# Patient Record
Sex: Female | Born: 1980 | Race: Black or African American | Hispanic: No | Smoking: Never smoker
Health system: Southern US, Community
[De-identification: ages and names within clinical notes are randomized; demographics above are authoritative.]

## PROBLEM LIST (undated history)

## (undated) ENCOUNTER — Ambulatory Visit: Disposition: A | Discharge status: Home / Self Care | Payer: Managed Care, Other (non HMO)

## (undated) ENCOUNTER — Inpatient Hospital Stay (HOSPITAL_COMMUNITY): Payer: Self-pay

## (undated) DIAGNOSIS — R011 Cardiac murmur, unspecified: Secondary | ICD-10-CM

## (undated) DIAGNOSIS — N879 Dysplasia of cervix uteri, unspecified: Secondary | ICD-10-CM

## (undated) DIAGNOSIS — O139 Gestational [pregnancy-induced] hypertension without significant proteinuria, unspecified trimester: Secondary | ICD-10-CM

## (undated) DIAGNOSIS — O10911 Unspecified pre-existing hypertension complicating pregnancy, first trimester: Secondary | ICD-10-CM

## (undated) DIAGNOSIS — B999 Unspecified infectious disease: Secondary | ICD-10-CM

## (undated) DIAGNOSIS — I10 Essential (primary) hypertension: Secondary | ICD-10-CM

## (undated) DIAGNOSIS — N390 Urinary tract infection, site not specified: Secondary | ICD-10-CM

## (undated) DIAGNOSIS — R87629 Unspecified abnormal cytological findings in specimens from vagina: Secondary | ICD-10-CM

## (undated) DIAGNOSIS — O1410 Severe pre-eclampsia, unspecified trimester: Secondary | ICD-10-CM

## (undated) DIAGNOSIS — R51 Headache: Secondary | ICD-10-CM

## (undated) DIAGNOSIS — K8020 Calculus of gallbladder without cholecystitis without obstruction: Secondary | ICD-10-CM

## (undated) HISTORY — PX: LEEP: SHX91

## (undated) HISTORY — DX: Dysplasia of cervix uteri, unspecified: N87.9

## (undated) HISTORY — DX: Urinary tract infection, site not specified: N39.0

## (undated) HISTORY — DX: Severe pre-eclampsia, unspecified trimester: O14.10

## (undated) HISTORY — PX: DILATION AND CURETTAGE OF UTERUS: SHX78

## (undated) HISTORY — DX: Unspecified pre-existing hypertension complicating pregnancy, first trimester: O10.911

---

## 2010-07-18 NOTE — L&D Delivery Note (Signed)
Delivery Note  Pt was complete and pushed well, infant with spontaneous respirations and placed on mom's abdomen  At 3:03 AM a viable female was delivered via Vaginal, Spontaneous Delivery (Presentation: Right Occiput Anterior).  APGAR: 9, 9 , ; weight 5#5oz Placenta status: , Spontaneous.  Cord: 3 vessels with the following complications: Marland Kitchen  Marginal cord insertion, slightly velamentous insertion, sent to pathology  Routine cord blood collected.   Anesthesia: None  Episiotomy: None Lacerations: None Suture Repair: none Est. Blood Loss (mL): 200cc  Mom to postpartum.  Baby to nursery-stable  Mom plans to breast feed.  Jaydalee Bardwell M 04/06/2011, 3:16 AM

## 2010-09-15 LAB — ABO/RH: RH Type: POSITIVE

## 2010-09-15 LAB — HIV ANTIBODY (ROUTINE TESTING W REFLEX): HIV: NONREACTIVE

## 2010-09-15 LAB — RUBELLA ANTIBODY, IGM: Rubella: IMMUNE

## 2010-09-15 LAB — GC/CHLAMYDIA PROBE AMP, GENITAL: Gonorrhea: NEGATIVE

## 2010-11-01 ENCOUNTER — Inpatient Hospital Stay (HOSPITAL_COMMUNITY)
Admission: AD | Admit: 2010-11-01 | Discharge: 2010-11-01 | Disposition: A | Discharge status: Home / Self Care | Payer: Medicaid Other | Source: Ambulatory Visit | Attending: Obstetrics & Gynecology | Admitting: Obstetrics & Gynecology

## 2010-11-01 DIAGNOSIS — G44209 Tension-type headache, unspecified, not intractable: Secondary | ICD-10-CM | POA: Insufficient documentation

## 2010-11-01 DIAGNOSIS — O9989 Other specified diseases and conditions complicating pregnancy, childbirth and the puerperium: Secondary | ICD-10-CM

## 2010-11-01 DIAGNOSIS — O99891 Other specified diseases and conditions complicating pregnancy: Secondary | ICD-10-CM | POA: Insufficient documentation

## 2010-11-01 LAB — URINALYSIS, ROUTINE W REFLEX MICROSCOPIC
Bilirubin Urine: NEGATIVE
Glucose, UA: NEGATIVE mg/dL
Hgb urine dipstick: NEGATIVE
Ketones, ur: NEGATIVE mg/dL
Protein, ur: NEGATIVE mg/dL

## 2010-11-16 ENCOUNTER — Inpatient Hospital Stay (HOSPITAL_COMMUNITY): Payer: Medicaid Other

## 2010-11-16 ENCOUNTER — Inpatient Hospital Stay (HOSPITAL_COMMUNITY)
Admission: AD | Admit: 2010-11-16 | Discharge: 2010-11-17 | Disposition: A | Discharge status: Home / Self Care | Payer: Medicaid Other | Source: Ambulatory Visit | Attending: Obstetrics and Gynecology | Admitting: Obstetrics and Gynecology

## 2010-11-16 DIAGNOSIS — A499 Bacterial infection, unspecified: Secondary | ICD-10-CM | POA: Insufficient documentation

## 2010-11-16 DIAGNOSIS — B9689 Other specified bacterial agents as the cause of diseases classified elsewhere: Secondary | ICD-10-CM | POA: Insufficient documentation

## 2010-11-16 DIAGNOSIS — N760 Acute vaginitis: Secondary | ICD-10-CM | POA: Insufficient documentation

## 2010-11-16 DIAGNOSIS — O210 Mild hyperemesis gravidarum: Secondary | ICD-10-CM | POA: Insufficient documentation

## 2010-11-16 DIAGNOSIS — R51 Headache: Secondary | ICD-10-CM | POA: Insufficient documentation

## 2010-11-16 DIAGNOSIS — O2390 Unspecified genitourinary tract infection in pregnancy, unspecified trimester: Secondary | ICD-10-CM | POA: Insufficient documentation

## 2010-11-16 LAB — URINALYSIS, ROUTINE W REFLEX MICROSCOPIC
Bilirubin Urine: NEGATIVE
Glucose, UA: NEGATIVE mg/dL
Ketones, ur: NEGATIVE mg/dL
pH: 6 (ref 5.0–8.0)

## 2010-11-16 LAB — WET PREP, GENITAL

## 2010-11-16 LAB — CBC
Hemoglobin: 10.5 g/dL — ABNORMAL LOW (ref 12.0–15.0)
MCHC: 33.2 g/dL (ref 30.0–36.0)

## 2010-11-17 LAB — GC/CHLAMYDIA PROBE AMP, GENITAL
Chlamydia, DNA Probe: NEGATIVE
GC Probe Amp, Genital: NEGATIVE

## 2010-11-29 ENCOUNTER — Inpatient Hospital Stay (HOSPITAL_COMMUNITY)
Admission: AD | Admit: 2010-11-29 | Discharge: 2010-11-29 | Disposition: A | Discharge status: Home / Self Care | Payer: Medicaid Other | Source: Ambulatory Visit | Attending: Obstetrics and Gynecology | Admitting: Obstetrics and Gynecology

## 2010-11-29 DIAGNOSIS — O99891 Other specified diseases and conditions complicating pregnancy: Secondary | ICD-10-CM | POA: Insufficient documentation

## 2010-11-29 LAB — URINALYSIS, ROUTINE W REFLEX MICROSCOPIC
Bilirubin Urine: NEGATIVE
Ketones, ur: NEGATIVE mg/dL
Nitrite: NEGATIVE
Protein, ur: NEGATIVE mg/dL
Specific Gravity, Urine: 1.02 (ref 1.005–1.030)
Urobilinogen, UA: 1 mg/dL (ref 0.0–1.0)

## 2010-12-09 ENCOUNTER — Inpatient Hospital Stay (HOSPITAL_COMMUNITY)
Admission: AD | Admit: 2010-12-09 | Discharge: 2010-12-09 | Disposition: A | Discharge status: Home / Self Care | Payer: Medicaid Other | Source: Ambulatory Visit | Attending: Obstetrics and Gynecology | Admitting: Obstetrics and Gynecology

## 2010-12-09 DIAGNOSIS — O99891 Other specified diseases and conditions complicating pregnancy: Secondary | ICD-10-CM | POA: Insufficient documentation

## 2010-12-09 DIAGNOSIS — R109 Unspecified abdominal pain: Secondary | ICD-10-CM | POA: Insufficient documentation

## 2010-12-09 LAB — COMPREHENSIVE METABOLIC PANEL
AST: 22 U/L (ref 0–37)
Albumin: 3.3 g/dL — ABNORMAL LOW (ref 3.5–5.2)
Calcium: 9.7 mg/dL (ref 8.4–10.5)
Creatinine, Ser: 0.63 mg/dL (ref 0.4–1.2)
GFR calc Af Amer: >60 mL/min (ref >60)
Total Protein: 7.5 g/dL (ref 6.0–8.3)

## 2010-12-09 LAB — CBC
MCH: 27.8 pg (ref 26.0–34.0)
MCHC: 33.5 g/dL (ref 30.0–36.0)
Platelets: 238 10*3/uL (ref 150–400)
RBC: 4.43 MIL/uL (ref 3.87–5.11)
RDW: 13.5 % (ref 11.5–15.5)

## 2010-12-09 LAB — URINALYSIS, ROUTINE W REFLEX MICROSCOPIC
Bilirubin Urine: NEGATIVE
Glucose, UA: NEGATIVE mg/dL
Hgb urine dipstick: NEGATIVE
Ketones, ur: 40 mg/dL — AB
pH: 6 (ref 5.0–8.0)

## 2010-12-09 LAB — WET PREP, GENITAL: Yeast Wet Prep HPF POC: NONE SEEN

## 2011-03-26 ENCOUNTER — Inpatient Hospital Stay (HOSPITAL_COMMUNITY)
Admission: AD | Admit: 2011-03-26 | Discharge: 2011-03-27 | Disposition: A | Discharge status: Home / Self Care | Payer: Medicaid Other | Source: Ambulatory Visit | Attending: Obstetrics and Gynecology | Admitting: Obstetrics and Gynecology

## 2011-03-26 ENCOUNTER — Encounter (HOSPITAL_COMMUNITY): Payer: Self-pay

## 2011-03-26 DIAGNOSIS — O9989 Other specified diseases and conditions complicating pregnancy, childbirth and the puerperium: Secondary | ICD-10-CM | POA: Insufficient documentation

## 2011-03-26 DIAGNOSIS — R109 Unspecified abdominal pain: Secondary | ICD-10-CM | POA: Insufficient documentation

## 2011-03-26 DIAGNOSIS — Z331 Pregnant state, incidental: Secondary | ICD-10-CM

## 2011-03-26 LAB — URINALYSIS, ROUTINE W REFLEX MICROSCOPIC
Glucose, UA: NEGATIVE mg/dL
Protein, ur: NEGATIVE mg/dL
Specific Gravity, Urine: <1.005 — ABNORMAL LOW (ref 1.005–1.030)

## 2011-03-26 LAB — URINE MICROSCOPIC-ADD ON

## 2011-03-26 LAB — WET PREP, GENITAL
Clue Cells Wet Prep HPF POC: NONE SEEN
Yeast Wet Prep HPF POC: NONE SEEN

## 2011-03-26 MED ORDER — ONDANSETRON 8 MG PO TBDP
8.0000 mg | ORAL_TABLET | Freq: Once | ORAL | Status: AC
  Administered 2011-03-26: 8 mg via ORAL
  Filled 2011-03-26: qty 1

## 2011-03-26 NOTE — ED Provider Notes (Signed)
History     Chief Complaint  Patient presents with  . Abdominal Cramping   HPI Pt presents with c/o sudden onset of sharp abdominal pain in the middle of her abdomen this morning when lying in bed.  She reports the pain continues to be present but has decreased.  She denies rupture of membranes or vag bleeding.  She reports her fetus is moving normally. OB History    Grav Para Term Preterm Abortions TAB SAB Ect Mult Living   7 1 1  0 5 1 4  0 0 1      Past Medical History  Diagnosis Date  . No pertinent past medical history     Past Surgical History  Procedure Date  . No past surgeries     No family history on file.  History  Substance Use Topics  . Smoking status: Never Smoker   . Smokeless tobacco: Not on file  . Alcohol Use: No    Allergies: No Known Allergies  Prescriptions prior to admission  Medication Sig Dispense Refill  . acetaminophen (TYLENOL) 500 MG tablet Take 1,000 mg by mouth 3 (three) times daily as needed. For headache        . prenatal vitamin w/FE, FA (PRENATAL 1 + 1) 27-1 MG TABS Take 1 tablet by mouth daily.          Review of Systems  Constitutional: Negative.   HENT: Negative.   Eyes: Negative.   Respiratory: Negative.   Cardiovascular: Negative.   Gastrointestinal: Positive for abdominal pain.  Genitourinary: Negative.   Musculoskeletal: Negative.   Skin: Negative.   Neurological: Negative.   Endo/Heme/Allergies: Negative.    Physical Exam   Blood pressure 136/93, pulse 84, temperature 97.6 F (36.4 C), temperature source Oral, height 5\' 5"  (1.651 m), weight 82.736 kg (182 lb 6.4 oz). Filed Vitals:   03/26/11 1429 03/26/11 1745  BP: 136/93 122/72  Pulse: 84 78  Temp: 97.6 F (36.4 C)   TempSrc: Oral   Resp:  18  Height: 5\' 5"  (1.651 m)   Weight: 82.736 kg (182 lb 6.4 oz)    Physical Exam  Constitutional: She is oriented to person, place, and time. She appears well-developed and well-nourished.  HENT:  Head: Normocephalic  and atraumatic.  Right Ear: External ear normal.  Left Ear: External ear normal.  Nose: Nose normal.  Eyes: Conjunctivae and EOM are normal. Pupils are equal, round, and reactive to light.  Neck: Normal range of motion. Neck supple. No thyromegaly present.  Cardiovascular: Normal rate, regular rhythm, normal heart sounds and intact distal pulses.   Respiratory: Effort normal and breath sounds normal.  GI: Soft. Bowel sounds are normal. There is no tenderness.  Genitourinary: Vagina normal and uterus normal.  Musculoskeletal: Normal range of motion.  Neurological: She is alert and oriented to person, place, and time. She has normal reflexes.  Skin: Skin is warm and dry.  Psychiatric: She has a normal mood and affect. Her behavior is normal. Judgment and thought content normal.  FHR 140 baseline with moderate variability present.  Accels present.  No decels present.  Cat I FHR tracing UCs since admission every 10-15 minutes with 60 sec duration and mild to palpation SVE 3/50/-2 on admission and at the time of discharge. Results for orders placed during the hospital encounter of 03/26/11 (from the past 24 hour(s))  URINALYSIS, ROUTINE W REFLEX MICROSCOPIC     Status: Abnormal   Collection Time   03/26/11  2:35 PM  Component Value Range   Color, Urine YELLOW  YELLOW    Appearance HAZY (*) CLEAR    Specific Gravity, Urine <1.005 (*) 1.005 - 1.030    pH 6.5  5.0 - 8.0    Glucose, UA NEGATIVE  NEGATIVE (mg/dL)   Hgb urine dipstick TRACE (*) NEGATIVE    Bilirubin Urine NEGATIVE  NEGATIVE    Ketones, ur NEGATIVE  NEGATIVE (mg/dL)   Protein, ur NEGATIVE  NEGATIVE (mg/dL)   Urobilinogen, UA 0.2  0.0 - 1.0 (mg/dL)   Nitrite NEGATIVE  NEGATIVE    Leukocytes, UA MODERATE (*) NEGATIVE   URINE MICROSCOPIC-ADD ON     Status: Abnormal   Collection Time   03/26/11  2:35 PM      Component Value Range   Squamous Epithelial / LPF MANY (*) RARE    WBC, UA TOO NUMEROUS TO COUNT  <3 (WBC/hpf)   RBC /  HPF 0-2  <3 (RBC/hpf)   Bacteria, UA MANY (*) RARE   WET PREP, GENITAL     Status: Abnormal   Collection Time   03/26/11  3:45 PM      Component Value Range   Yeast, Wet Prep NONE SEEN  NONE SEEN    Trich, Wet Prep NONE SEEN  NONE SEEN    Clue Cells, Wet Prep NONE SEEN  NONE SEEN    WBC, Wet Prep HPF POC MODERATE (*) NONE SEEN     MAU Course  Procedures GC/Chl GBS culture UA Urine culture (results pending)   Assessment and Plan  IUP at 35.2 weeks Abdominal pain  Consult obtained with Dr. Su Hilt.   D/W pt pain more consistent with musculoskeletal origin.  Comfort measures d/w pt. Discharge to home. Reviewed signs/symptoms of preterm labor. Pt to RTO at [facility] on Mon, 9/10 as previously scheduled.   Brittany [name] O. 03/26/2011, 5:37 PM

## 2011-03-26 NOTE — Progress Notes (Signed)
Onset of cramping x 2 days worse today, denies vaginal bleeding, no discharge.

## 2011-03-28 LAB — CULTURE, BETA STREP (GROUP B ONLY)

## 2011-03-29 LAB — URINE CULTURE

## 2011-04-03 ENCOUNTER — Inpatient Hospital Stay (HOSPITAL_COMMUNITY)
Admission: AD | Admit: 2011-04-03 | Discharge: 2011-04-03 | Disposition: A | Discharge status: Home / Self Care | Payer: Medicaid Other | Source: Ambulatory Visit | Attending: Obstetrics and Gynecology | Admitting: Obstetrics and Gynecology

## 2011-04-03 ENCOUNTER — Encounter (HOSPITAL_COMMUNITY): Payer: Self-pay | Admitting: *Deleted

## 2011-04-03 DIAGNOSIS — M549 Dorsalgia, unspecified: Secondary | ICD-10-CM | POA: Insufficient documentation

## 2011-04-03 DIAGNOSIS — N949 Unspecified condition associated with female genital organs and menstrual cycle: Secondary | ICD-10-CM | POA: Insufficient documentation

## 2011-04-03 DIAGNOSIS — O99891 Other specified diseases and conditions complicating pregnancy: Secondary | ICD-10-CM | POA: Insufficient documentation

## 2011-04-03 LAB — COMPREHENSIVE METABOLIC PANEL
ALT: 15 U/L (ref 0–35)
AST: 22 U/L (ref 0–37)
Albumin: 2.6 g/dL — ABNORMAL LOW (ref 3.5–5.2)
CO2: 22 mEq/L (ref 19–32)
Calcium: 8.9 mg/dL (ref 8.4–10.5)
Chloride: 102 mEq/L (ref 96–112)
Creatinine, Ser: 0.5 mg/dL (ref 0.50–1.10)
Sodium: 135 mEq/L (ref 135–145)
Total Bilirubin: 0.2 mg/dL — ABNORMAL LOW (ref 0.3–1.2)

## 2011-04-03 LAB — URINALYSIS, ROUTINE W REFLEX MICROSCOPIC
Nitrite: NEGATIVE
Specific Gravity, Urine: 1.01 (ref 1.005–1.030)
Urobilinogen, UA: 0.2 mg/dL (ref 0.0–1.0)

## 2011-04-03 LAB — CBC
Hemoglobin: 11.2 g/dL — ABNORMAL LOW (ref 12.0–15.0)
MCH: 28.6 pg (ref 26.0–34.0)
Platelets: 197 10*3/uL (ref 150–400)
RBC: 3.92 MIL/uL (ref 3.87–5.11)
WBC: 12.1 10*3/uL — ABNORMAL HIGH (ref 4.0–10.5)

## 2011-04-03 LAB — URINE MICROSCOPIC-ADD ON

## 2011-04-03 LAB — URIC ACID: Uric Acid, Serum: 3.8 mg/dL (ref 2.4–7.0)

## 2011-04-03 LAB — BASIC METABOLIC PANEL
CO2: 23 mEq/L (ref 19–32)
Calcium: 8.8 mg/dL (ref 8.4–10.5)
Chloride: 103 mEq/L (ref 96–112)
Glucose, Bld: 72 mg/dL (ref 70–99)
Potassium: 3.5 mEq/L (ref 3.5–5.1)
Sodium: 134 mEq/L — ABNORMAL LOW (ref 135–145)

## 2011-04-03 LAB — LACTATE DEHYDROGENASE: LDH: 158 U/L (ref 94–250)

## 2011-04-03 MED ORDER — CYCLOBENZAPRINE HCL 10 MG PO TABS
10.0000 mg | ORAL_TABLET | Freq: Three times a day (TID) | ORAL | Status: DC | PRN
  Administered 2011-04-03: 10 mg via ORAL
  Filled 2011-04-03: qty 1

## 2011-04-03 MED ORDER — TRAMADOL HCL 50 MG PO TABS: 50.0000 mg | ORAL_TABLET | Freq: Four times a day (QID) | ORAL | Status: DC | PRN

## 2011-04-03 MED ORDER — TRAMADOL HCL 50 MG PO TABS
100.0000 mg | ORAL_TABLET | Freq: Once | ORAL | Status: AC
  Administered 2011-04-03: 100 mg via ORAL
  Filled 2011-04-03: qty 2

## 2011-04-03 MED ORDER — CYCLOBENZAPRINE HCL 10 MG PO TABS: 10.0000 mg | ORAL_TABLET | Freq: Three times a day (TID) | ORAL | Status: DC | PRN

## 2011-04-03 NOTE — Plan of Care (Signed)
Called patient to inform PIH labs were normal and to discontinue ultram.

## 2011-04-03 NOTE — Progress Notes (Signed)
Lower back pain with pressure and cramping since last night, light headed today.

## 2011-04-03 NOTE — Progress Notes (Signed)
Pt c/o upper right sided back pain since yesterday.  Pt has taken tylenol and it has not worked yet.   States she feels pressure when she voids and when she walks for the past two weeks but has worsened this morning.

## 2011-04-03 NOTE — ED Provider Notes (Signed)
History    30 yo [id number] at 28 w 3 days presented unannounced c/o severe mid back pain, lower pelvic pressure--has been on-going, but worse today.  Denies nausea, vomiting, fever, vaginal bleeding, or discharge.  Cervix was 3 cm in office on Monday.  Reports this pain is different from contractions.  Has had musculoskeletal issues throughout pregnancy, with previous use of Vicodin and Flexeril for back and pelvic pain--"they didn't help, just made me sleepy". Also c/o dizziness, no syncope, or headache.  Pregnancy remarkable for: 4 SABs, 1 TAB Hx LEEP 1st trimester bleeding + GBS on urine culture 2/12 Frequent HAs  Chief Complaint  Patient presents with  . Back Pain  . Dizziness  . Abdominal Cramping     OB History    Grav Para Term Preterm Abortions TAB SAB Ect Mult Living   7 1 1  0 5 1 4  0 0 1      Past Medical History  Diagnosis Date  . No pertinent past medical history     Past Surgical History  Procedure Date  . No past surgeries   . Multiple abortions   Hx LEEP  Family History  Problem Relation Age of Onset  . Asthma Mother   . Hypertension Mother   . Asthma Maternal Grandmother     History  Substance Use Topics  . Smoking status: Never Smoker   . Smokeless tobacco: Not on file  . Alcohol Use: No    Allergies: No Known Allergies  Prescriptions prior to admission  Medication Sig Dispense Refill  . acetaminophen (TYLENOL) 500 MG tablet Take 1,000 mg by mouth 3 (three) times daily as needed. For headache        . prenatal vitamin w/FE, FA (PRENATAL 1 + 1) 27-1 MG TABS Take 1 tablet by mouth daily.           Physical Exam   Blood pressure 140/87, pulse 75, temperature 98.5 F (36.9 C), temperature source Oral, resp. rate 16, height 5\' 5"  (1.651 m), weight 85.276 kg (188 lb).  Chest clear Heart RRR Abd--Gravid, NT No clear CVAT, but mid back area tender bilaterally Pelvic--Cx posterior, 3 cm, 70%, vtx -2 FHR reactive, no decels UCs sporadic,  mild--patient does not react to those  UA--Negative Sent to culture ED Course  IUP at 36 3/7 weeks Back pain, pelvic pressure--likely due to 3rd trimester miseries No evidence labor  Plan: Ultram and Flexeril now Will observe response.  Nigel Bridgeman, CNM 04/03/11  1720

## 2011-04-03 NOTE — ED Provider Notes (Signed)
Filed Vitals:   04/03/11 1538 04/03/11 1557 04/03/11 1843  BP: 132/87 140/87 140/90  Pulse: 83 75 67  Temp: 98.5 F (36.9 C)    TempSrc: Oral    Resp: 18 16 16   Height: 5\' 5"  (1.651 m)    Weight: 85.276 kg (188 lb)     Pt denies HA/N/V epigastric pain Neg edema, DTR's nl, neg clonus  PIH labs drawn  Pt desires to go home, will call if abnl results  Pt sent home with 24hr urine collection to RTO on Wednesday during her appt.  Pt feels better after flexeril and ultram given.   DC'd home with RX for ultram and flexeril  FKC, labor sx's, and PIH precautions given  D/W Dr. Pennie Rushing and recommends offering percocet for pain instead of ultram  Will call pt to DC ultram  PIH labs normal  Results for orders placed during the hospital encounter of 04/03/11 (from the past 24 hour(s))  URINALYSIS, ROUTINE W REFLEX MICROSCOPIC     Status: Abnormal   Collection Time   04/03/11  3:40 PM      Component Value Range   Color, Urine YELLOW  YELLOW    Appearance CLEAR  CLEAR    Specific Gravity, Urine 1.010  1.005 - 1.030    pH 6.0  5.0 - 8.0    Glucose, UA NEGATIVE  NEGATIVE (mg/dL)   Hgb urine dipstick TRACE (*) NEGATIVE    Bilirubin Urine NEGATIVE  NEGATIVE    Ketones, ur NEGATIVE  NEGATIVE (mg/dL)   Protein, ur NEGATIVE  NEGATIVE (mg/dL)   Urobilinogen, UA 0.2  0.0 - 1.0 (mg/dL)   Nitrite NEGATIVE  NEGATIVE    Leukocytes, UA NEGATIVE  NEGATIVE   URINE MICROSCOPIC-ADD ON     Status: Normal   Collection Time   04/03/11  3:40 PM      Component Value Range   Squamous Epithelial / LPF RARE  RARE    RBC / HPF 0-2  <3 (RBC/hpf)  CBC     Status: Abnormal   Collection Time   04/03/11  6:50 PM      Component Value Range   WBC 12.1 (*) 4.0 - 10.5 (K/uL)   RBC 3.92  3.87 - 5.11 (MIL/uL)   Hemoglobin 11.2 (*) 12.0 - 15.0 (g/dL)   HCT 21.3 (*) 08.6 - 46.0 (%)   MCV 85.2  78.0 - 100.0 (fL)   MCH 28.6  26.0 - 34.0 (pg)   MCHC 33.5  30.0 - 36.0 (g/dL)   RDW 57.8  46.9 - 62.9 (%)   Platelets 197  150 - 400 (K/uL)  BASIC METABOLIC PANEL     Status: Abnormal   Collection Time   04/03/11  6:50 PM      Component Value Range   Sodium 134 (*) 135 - 145 (mEq/L)   Potassium 3.5  3.5 - 5.1 (mEq/L)   Chloride 103  96 - 112 (mEq/L)   CO2 23  19 - 32 (mEq/L)   Glucose, Bld 72  70 - 99 (mg/dL)   BUN 7  6 - 23 (mg/dL)   Creatinine, Ser 5.28 (*) 0.50 - 1.10 (mg/dL)   Calcium 8.8  8.4 - 41.3 (mg/dL)   GFR calc non Af Amer >60  >60 (mL/min)   GFR calc Af Amer >60  >60 (mL/min)  LACTATE DEHYDROGENASE     Status: Normal   Collection Time   04/03/11  6:50 PM      Component Value Range  LD 158  94 - 250 (U/L)  URIC ACID     Status: Normal   Collection Time   04/03/11  6:50 PM      Component Value Range   Uric Acid, Serum 3.8  2.4 - 7.0 (mg/dL)  COMPREHENSIVE METABOLIC PANEL     Status: Abnormal   Collection Time   04/03/11  7:00 PM      Component Value Range   Sodium 135  135 - 145 (mEq/L)   Potassium 3.6  3.5 - 5.1 (mEq/L)   Chloride 102  96 - 112 (mEq/L)   CO2 22  19 - 32 (mEq/L)   Glucose, Bld 73  70 - 99 (mg/dL)   BUN 7  6 - 23 (mg/dL)   Creatinine, Ser 1.61  0.50 - 1.10 (mg/dL)   Calcium 8.9  8.4 - 09.6 (mg/dL)   Total Protein 6.6  6.0 - 8.3 (g/dL)   Albumin 2.6 (*) 3.5 - 5.2 (g/dL)   AST 22  0 - 37 (U/L)   ALT 15  0 - 35 (U/L)   Alkaline Phosphatase 95  39 - 117 (U/L)   Total Bilirubin 0.2 (*) 0.3 - 1.2 (mg/dL)   GFR calc non Af Amer >60  >60 (mL/min)   GFR calc Af Amer >60  >60 (mL/min)

## 2011-04-04 LAB — URINE CULTURE: Colony Count: 7000

## 2011-04-05 ENCOUNTER — Inpatient Hospital Stay (HOSPITAL_COMMUNITY)
Admission: AD | Admit: 2011-04-05 | Discharge: 2011-04-09 | DRG: 775 | Disposition: A | Discharge status: Home / Self Care | Payer: Medicaid Other | Source: Ambulatory Visit | Attending: Obstetrics and Gynecology | Admitting: Obstetrics and Gynecology

## 2011-04-05 ENCOUNTER — Encounter (HOSPITAL_COMMUNITY): Payer: Self-pay

## 2011-04-05 DIAGNOSIS — O99892 Other specified diseases and conditions complicating childbirth: Secondary | ICD-10-CM | POA: Diagnosis present

## 2011-04-05 DIAGNOSIS — O139 Gestational [pregnancy-induced] hypertension without significant proteinuria, unspecified trimester: Principal | ICD-10-CM | POA: Diagnosis present

## 2011-04-05 DIAGNOSIS — O119 Pre-existing hypertension with pre-eclampsia, unspecified trimester: Secondary | ICD-10-CM | POA: Diagnosis not present

## 2011-04-05 DIAGNOSIS — Z22330 Carrier of Group B streptococcus: Secondary | ICD-10-CM

## 2011-04-05 DIAGNOSIS — O10919 Unspecified pre-existing hypertension complicating pregnancy, unspecified trimester: Secondary | ICD-10-CM | POA: Diagnosis not present

## 2011-04-05 DIAGNOSIS — Z331 Pregnant state, incidental: Secondary | ICD-10-CM

## 2011-04-05 HISTORY — DX: Essential (primary) hypertension: I10

## 2011-04-05 HISTORY — DX: Headache: R51

## 2011-04-05 LAB — COMPREHENSIVE METABOLIC PANEL
AST: 20 U/L (ref 0–37)
Albumin: 2.5 g/dL — ABNORMAL LOW (ref 3.5–5.2)
BUN: 5 mg/dL — ABNORMAL LOW (ref 6–23)
CO2: 22 mEq/L (ref 19–32)
Calcium: 9.1 mg/dL (ref 8.4–10.5)
Chloride: 103 mEq/L (ref 96–112)
Creatinine, Ser: 0.54 mg/dL (ref 0.50–1.10)
GFR calc Af Amer: >60 mL/min (ref >60)
Total Bilirubin: 0.3 mg/dL (ref 0.3–1.2)

## 2011-04-05 LAB — CBC
HCT: 33.1 % — ABNORMAL LOW (ref 36.0–46.0)
Hemoglobin: 11.1 g/dL — ABNORMAL LOW (ref 12.0–15.0)
MCH: 28.4 pg (ref 26.0–34.0)
MCHC: 33.5 g/dL (ref 30.0–36.0)

## 2011-04-05 MED ORDER — ACETAMINOPHEN 325 MG PO TABS: 650.0000 mg | ORAL_TABLET | ORAL | Status: DC | PRN

## 2011-04-05 MED ORDER — OXYTOCIN 20 UNITS IN LACTATED RINGERS INFUSION - SIMPLE
125.0000 mL/h | Freq: Once | INTRAVENOUS | Status: AC
  Administered 2011-04-06: 999 mL/h via INTRAVENOUS

## 2011-04-05 MED ORDER — PENICILLIN G POTASSIUM 5000000 UNITS IJ SOLR
2.5000 10*6.[IU] | INTRAMUSCULAR | Status: DC
  Administered 2011-04-06: 2.5 10*6.[IU] via INTRAVENOUS
  Filled 2011-04-05 (×4): qty 2.5

## 2011-04-05 MED ORDER — PENICILLIN G POTASSIUM 5000000 UNITS IJ SOLR
5.0000 10*6.[IU] | Freq: Once | INTRAVENOUS | Status: AC
  Administered 2011-04-05: 5 10*6.[IU] via INTRAVENOUS
  Filled 2011-04-05: qty 5

## 2011-04-05 MED ORDER — CITRIC ACID-SODIUM CITRATE 334-500 MG/5ML PO SOLN: 30.0000 mL | ORAL | Status: DC | PRN

## 2011-04-05 MED ORDER — OXYTOCIN BOLUS FROM INFUSION
500.0000 mL | Freq: Once | INTRAVENOUS | Status: DC
  Filled 2011-04-05: qty 500
  Filled 2011-04-05: qty 1000

## 2011-04-05 MED ORDER — LACTATED RINGERS IV SOLN
INTRAVENOUS | Status: DC
  Administered 2011-04-05: 21:00:00 via INTRAVENOUS

## 2011-04-05 MED ORDER — LIDOCAINE HCL (PF) 1 % IJ SOLN
30.0000 mL | INTRAMUSCULAR | Status: AC | PRN
  Administered 2011-04-06: 30 mL via SUBCUTANEOUS
  Filled 2011-04-05: qty 30

## 2011-04-05 MED ORDER — ONDANSETRON HCL 4 MG/2ML IJ SOLN: 4.0000 mg | Freq: Four times a day (QID) | INTRAMUSCULAR | Status: DC | PRN

## 2011-04-05 MED ORDER — LACTATED RINGERS IV SOLN: 500.0000 mL | INTRAVENOUS | Status: DC | PRN

## 2011-04-05 MED ORDER — OXYTOCIN 10 UNIT/ML IJ SOLN: 10.0000 [IU] | Freq: Once | INTRAMUSCULAR | Status: DC

## 2011-04-05 MED ORDER — PENICILLIN G POTASSIUM 5000000 UNITS IJ SOLR
2.5000 10*6.[IU] | INTRAVENOUS | Status: DC
  Filled 2011-04-05: qty 2.5

## 2011-04-05 MED ORDER — IBUPROFEN 600 MG PO TABS
600.0000 mg | ORAL_TABLET | Freq: Four times a day (QID) | ORAL | Status: DC | PRN
  Administered 2011-04-06: 600 mg via ORAL
  Filled 2011-04-05: qty 1

## 2011-04-05 MED ORDER — BUTORPHANOL TARTRATE 2 MG/ML IJ SOLN
2.0000 mg | INTRAMUSCULAR | Status: DC | PRN
  Administered 2011-04-05 – 2011-04-06 (×2): 2 mg via INTRAVENOUS
  Filled 2011-04-05 (×2): qty 1

## 2011-04-05 MED ORDER — OXYCODONE-ACETAMINOPHEN 5-325 MG PO TABS
2.0000 | ORAL_TABLET | ORAL | Status: DC | PRN
  Administered 2011-04-06: 2 via ORAL
  Filled 2011-04-05: qty 2

## 2011-04-05 MED ORDER — PENICILLIN G POTASSIUM 5000000 UNITS IJ SOLR
2.5000 10*6.[IU] | INTRAVENOUS | Status: DC
  Filled 2011-04-05 (×10): qty 2.5

## 2011-04-05 NOTE — H&P (Signed)
Brittany [name] is a 30 y.o. female presenting for ROM at about 1900. Denies VB, very rare ctx +FM  Maternal Medical History:  Reason for admission: Reason for admission: rupture of membranes.  Contractions: Frequency: rare.   Perceived severity is mild.    Fetal activity: Perceived fetal activity is normal.   Last perceived fetal movement was within the past hour.      Pt began prenatal care at [facility] at 15wks. Pt c/o persistent severe HA's in 1st trimester.  Pt moved here from [place], Brittany [name], was seen in MAU and dx of tension HA, RX percocet, ibuprofen, and rec zyrtec, and flexeril.  Pt had some elevated BP at ROB visits repeats were decreased.  Pt was seen in MAU on 9-16 for ctx, no cervical change, however BP's were slightly elevated at 140's/90's. PIH labs were done and were normal. Pt was instructed to collect a 24hr urine which was to be completed tomorrow morning.   OB History    Grav Para Term Preterm Abortions TAB SAB Ect Mult Living   7 1 1  0 5 1 4  0 0 1     Past Medical History  Diagnosis Date  . No pertinent past medical history   . Hypertension   . Headache    Recurrent SAB  Past Surgical History  Procedure Date  . No past surgeries   . Multiple abortions   . Leep    Pt did have a D&C on October 2011 after a SAB  Family History: family history includes Asthma in her maternal grandmother and mother and Hypertension in her mother. Social History:  reports that she has never smoked. She does not have any smokeless tobacco history on file. She reports that she does not drink alcohol or use illicit drugs.  Married to Brittany [name], Brittany [name], works part-time.   Review of Systems  All other systems reviewed and are negative.    Dilation: 4 Effacement (%): 90 Station: -1 Exam by:: S Brittany [name] CNM Blood pressure 149/95, pulse 76, temperature 99.4 F (37.4 C), temperature source Oral, resp. rate 18, height 5\' 5"  (1.651 m), weight 83.008 kg (183 lb). Maternal Exam:  Uterine  Assessment: Contraction strength is mild.  Contraction frequency is rare.   Abdomen: Patient reports no abdominal tenderness. Fundal height is aga.   Estimated fetal weight is 5#5oz.   Fetal presentation: vertex  Introitus: Normal vulva. Normal vagina.  Ferning test: positive.  Nitrazine test: not done. Amniotic fluid character: clear.  Pelvis: adequate for delivery.   Cervix: Cervix evaluated by sterile speculum exam and digital exam.     Fetal Exam Fetal Monitor Review: Mode: ultrasound.   Baseline rate: 140.  Variability: moderate (6-25 bpm).   Pattern: accelerations present and no decelerations.    Fetal State Assessment: Category I - tracings are normal.     Physical Exam  Nursing note and vitals reviewed. Constitutional: She is oriented to person, place, and time. She appears well-developed and well-nourished.  Cardiovascular: Normal rate.   Respiratory: Effort normal.  GI: Soft.  Genitourinary: Vagina normal.  Neurological: She is alert and oriented to person, place, and time. She has normal reflexes.  Skin: Skin is warm and dry.  Psychiatric: She has a normal mood and affect. Her behavior is normal. Judgment and thought content normal.    Prenatal labs: ABO, Rh: O/Positive/-- (02/29 0000) Antibody: Negative (02/29 0000) Rubella:  immune RPR: Nonreactive (02/29 0000)  HBsAg: Negative (02/29 0000)  HIV: Non-reactive (02/29 0000)  GBS: Positive (  02/29 0000)  Early 1hr gtt = 125  Labs at 28wks 1hr gtt=97 Hgb=12.0 RPR neg  Assessment/Plan: 36wks IUP with SROM, pos pooling, pos fern No active labor GBS pos Elevated BP's, likely gest HTN, PIH labs normal  Plan: Admit to BS Routine CNM orders Repeat PIH labs Start 24hr urine if Foley cath or place PP  D/W Dr Brittany [name]  Brittany [name] 04/05/2011, 10:15 PM

## 2011-04-05 NOTE — Progress Notes (Signed)
Pt reports large gush of clear fluid around 1900 today. Denies vaginal bleeding and states not feeling contractions.

## 2011-04-06 ENCOUNTER — Encounter (HOSPITAL_COMMUNITY): Payer: Self-pay | Admitting: *Deleted

## 2011-04-06 ENCOUNTER — Other Ambulatory Visit: Payer: Self-pay | Admitting: Obstetrics and Gynecology

## 2011-04-06 DIAGNOSIS — O139 Gestational [pregnancy-induced] hypertension without significant proteinuria, unspecified trimester: Secondary | ICD-10-CM

## 2011-04-06 DIAGNOSIS — O1490 Unspecified pre-eclampsia, unspecified trimester: Secondary | ICD-10-CM

## 2011-04-06 LAB — RPR: RPR Ser Ql: NONREACTIVE

## 2011-04-06 LAB — ABO/RH: ABO/RH(D): O POS

## 2011-04-06 MED ORDER — OXYCODONE-ACETAMINOPHEN 5-325 MG PO TABS
1.0000 | ORAL_TABLET | ORAL | Status: DC | PRN
  Administered 2011-04-06 (×2): 2 via ORAL
  Administered 2011-04-08: 1 via ORAL
  Administered 2011-04-08: 2 via ORAL
  Administered 2011-04-09: 1 via ORAL
  Filled 2011-04-06 (×2): qty 1
  Filled 2011-04-06 (×3): qty 2
  Filled 2011-04-06: qty 1

## 2011-04-06 MED ORDER — TETANUS-DIPHTH-ACELL PERTUSSIS 5-2.5-18.5 LF-MCG/0.5 IM SUSP
0.5000 mL | Freq: Once | INTRAMUSCULAR | Status: AC
  Administered 2011-04-07: 0.5 mL via INTRAMUSCULAR
  Filled 2011-04-06: qty 0.5

## 2011-04-06 MED ORDER — SIMETHICONE 80 MG PO CHEW: 80.0000 mg | CHEWABLE_TABLET | ORAL | Status: DC | PRN

## 2011-04-06 MED ORDER — DIBUCAINE 1 % RE OINT: 1.0000 "application " | TOPICAL_OINTMENT | RECTAL | Status: DC | PRN

## 2011-04-06 MED ORDER — WITCH HAZEL-GLYCERIN EX PADS: 1.0000 "application " | MEDICATED_PAD | CUTANEOUS | Status: DC | PRN

## 2011-04-06 MED ORDER — MEASLES, MUMPS & RUBELLA VAC ~~LOC~~ INJ
0.5000 mL | INJECTION | Freq: Once | SUBCUTANEOUS | Status: DC
  Filled 2011-04-06: qty 0.5

## 2011-04-06 MED ORDER — ONDANSETRON HCL 4 MG/2ML IJ SOLN: 4.0000 mg | INTRAMUSCULAR | Status: DC | PRN

## 2011-04-06 MED ORDER — ZOLPIDEM TARTRATE 5 MG PO TABS: 5.0000 mg | ORAL_TABLET | Freq: Every evening | ORAL | Status: DC | PRN

## 2011-04-06 MED ORDER — SENNOSIDES-DOCUSATE SODIUM 8.6-50 MG PO TABS
2.0000 | ORAL_TABLET | Freq: Every day | ORAL | Status: DC
  Administered 2011-04-06 – 2011-04-07 (×2): 2 via ORAL

## 2011-04-06 MED ORDER — ONDANSETRON HCL 4 MG PO TABS: 4.0000 mg | ORAL_TABLET | ORAL | Status: DC | PRN

## 2011-04-06 MED ORDER — LANOLIN HYDROUS EX OINT: TOPICAL_OINTMENT | CUTANEOUS | Status: DC | PRN

## 2011-04-06 MED ORDER — IBUPROFEN 600 MG PO TABS
600.0000 mg | ORAL_TABLET | Freq: Four times a day (QID) | ORAL | Status: DC
  Administered 2011-04-06 – 2011-04-09 (×13): 600 mg via ORAL
  Filled 2011-04-06 (×13): qty 1

## 2011-04-06 MED ORDER — DIPHENHYDRAMINE HCL 25 MG PO CAPS: 25.0000 mg | ORAL_CAPSULE | Freq: Four times a day (QID) | ORAL | Status: DC | PRN

## 2011-04-06 MED ORDER — PRENATAL PLUS 27-1 MG PO TABS
1.0000 | ORAL_TABLET | Freq: Every day | ORAL | Status: DC
  Administered 2011-04-07 – 2011-04-09 (×3): 1 via ORAL
  Filled 2011-04-06 (×3): qty 1

## 2011-04-06 MED ORDER — BENZOCAINE-MENTHOL 20-0.5 % EX AERO: 1.0000 "application " | INHALATION_SPRAY | CUTANEOUS | Status: DC | PRN

## 2011-04-06 NOTE — Progress Notes (Signed)
.  Subjective: Coping well, s/p 2nd dose of stadol   Objective: BP 147/76  Pulse 106  Temp(Src) 97.7 F (36.5 C) (Oral)  Resp 18  Ht 5\' 5"  (1.651 m)  Wt 83.008 kg (183 lb)  BMI 30.45 kg/m2   FHT:  FHR: 130 bpm, variability: moderate,  accelerations:  Present,  decelerations:  Absent UC:   regular, every 2-3 minutes SVE:   Dilation: 7 Effacement (%): 90 Station: -2 Exam by:: r. gagnon,rnc    Assessment / Plan: Spontaneous labor, progressing normally  Fetal Wellbeing:  Category I Pain Control:  stadol   Kaiyu Mirabal M 04/06/2011, 1:33 AM

## 2011-04-06 NOTE — Progress Notes (Signed)
Pt received 2doses of PCN prior to delivery.  Foley catheter inserted after delivery for 24hour urine protein collection.

## 2011-04-06 NOTE — Progress Notes (Signed)
UR chart review completed.  

## 2011-04-07 LAB — CBC
HCT: 28.7 % — ABNORMAL LOW (ref 36.0–46.0)
Hemoglobin: 9.5 g/dL — ABNORMAL LOW (ref 12.0–15.0)
MCV: 85.7 fL (ref 78.0–100.0)
RBC: 3.35 MIL/uL — ABNORMAL LOW (ref 3.87–5.11)
RDW: 14.6 % (ref 11.5–15.5)
WBC: 15.5 10*3/uL — ABNORMAL HIGH (ref 4.0–10.5)

## 2011-04-07 LAB — PROTEIN, URINE, 24 HOUR
Protein, 24H Urine: 220 mg/d — ABNORMAL HIGH (ref 50–100)
Protein, Urine: 11 mg/dL
Urine Total Volume-UPROT: 2000 mL

## 2011-04-07 NOTE — Progress Notes (Addendum)
Post Partum Day 1 Subjective: no complaints.  Foley just removed at 6:30am, no spontaneous void yet.  24 hour urine not resulted yet.  Denies HA, visual symptoms, epigastric pain.  Plans breastfeeding.  Objective: Blood pressure 123/78, pulse 76, temperature 98.4 F (36.9 C), temperature source Oral, resp. rate 18, height 5\' 5"  (1.651 m), weight 83.008 kg (183 lb), SpO2 100.00%, unknown if currently breastfeeding. BP range last 24 hours 120s-130s/70s-86.  Physical Exam:  General: alert Lochia: appropriate Uterine Fundus: firm Incision: Perineum clear DVT Evaluation: No evidence of DVT seen on physical exam.   Basename 04/07/11 0010 04/05/11 2040  HGB 9.5* 11.1*  HCT 28.7* 33.1*    Assessment/Plan: Continue current care Await 24 hour urine result.   LOS: 2 days   Madylin Fairbank L 04/07/2011, 8:27 AM    Addendum: 24 hour urine protein result 220. Will continue to observe BP.  Nigel Bridgeman, CNM 04/07/11 1930

## 2011-04-08 MED ORDER — NIFEDIPINE ER 60 MG PO TB24
60.0000 mg | ORAL_TABLET | Freq: Every day | ORAL | Status: DC
  Administered 2011-04-08 – 2011-04-09 (×2): 60 mg via ORAL
  Filled 2011-04-08 (×2): qty 1

## 2011-04-08 NOTE — Progress Notes (Signed)
Post Partum Day 2 Subjective: Reports HA (6/10) this am.  Denies visual sx or epigastric pain.  No swelling.  Baby has lost weight, mom breastfeeding.  Objective: Blood pressure 148/101, pulse 85, temperature 97.9 F (36.6 C), temperature source Oral, resp. rate 18, height 5\' 5"  (1.651 m), weight 83.008 kg (183 lb), SpO2 100.00%, unknown if currently breastfeeding. BP range 137-153/95-101 since MN.  24 hour urine resulted last night = 220 mg protein.  Physical Exam:  General: alert Lochia: appropriate Uterine Fundus: firm Incision: Intact DVT Evaluation: No evidence of DVT seen on physical exam. DTR 2+ without clonus, tr edema   Basename 04/07/11 0010 04/05/11 2040  HGB 9.5* 11.1*  HCT 28.7* 33.1*    Assessment/Plan: Day 2 PP Gestational hypertension  Consulted with Dr. Stefano [name]. Start Procardia 60 mg XL. Weigh patient If baby has to stay, will continue patient's hospitalization due to elevated BP.   LOS: 3 days   Brittany [name] 04/08/2011, 8:30 AM

## 2011-04-08 NOTE — Progress Notes (Signed)
Post Partum Day 2 Subjective: Started Procardia 60 mg XL this am.  Baby was to remain a patient due to weight loss.  Patient without complaint.  Objective: Blood pressure 124/78, pulse 100, temperature 98 F (36.7 C), temperature source Oral, resp. rate 20, height 5\' 5"  (1.651 m), weight 78.472 kg (173 lb), SpO2 100.00%, unknown if currently breastfeeding. BP range since Procardia 120s/70s.  Assessment/Plan: Will continue to observe BP overnight, and expect discharge tomorrow with Procardia Rx.   LOS: 3 days   Nigel Bridgeman 04/08/2011, 2:43 PM

## 2011-04-08 NOTE — Progress Notes (Signed)
Patient sitting in bed. Blood pressure checked with result of 153/101. Patient has no c/o headache, dizziness, blurry vision or pain at this time. Mid-wife on call notified. Orders to check B/P every 2 hours. Will continue to monitor and report to on coming nurse.

## 2011-04-09 DIAGNOSIS — O10919 Unspecified pre-existing hypertension complicating pregnancy, unspecified trimester: Secondary | ICD-10-CM | POA: Diagnosis not present

## 2011-04-09 DIAGNOSIS — Z331 Pregnant state, incidental: Secondary | ICD-10-CM

## 2011-04-09 DIAGNOSIS — O119 Pre-existing hypertension with pre-eclampsia, unspecified trimester: Secondary | ICD-10-CM | POA: Diagnosis not present

## 2011-04-09 DIAGNOSIS — O10911 Unspecified pre-existing hypertension complicating pregnancy, first trimester: Secondary | ICD-10-CM

## 2011-04-09 HISTORY — DX: Unspecified pre-existing hypertension complicating pregnancy, first trimester: O10.911

## 2011-04-09 MED ORDER — OXYCODONE-ACETAMINOPHEN 5-325 MG PO TABS: 1.0000 | ORAL_TABLET | ORAL | Status: AC | PRN

## 2011-04-09 MED ORDER — FERROUS SULFATE 325 (65 FE) MG PO TABS: 325.0000 mg | ORAL_TABLET | Freq: Every day | ORAL | Status: DC

## 2011-04-09 MED ORDER — IBUPROFEN 600 MG PO TABS: 600.0000 mg | ORAL_TABLET | Freq: Four times a day (QID) | ORAL | Status: AC

## 2011-04-09 MED ORDER — NIFEDIPINE ER 60 MG PO TB24: 60.0000 mg | ORAL_TABLET | Freq: Every day | ORAL | Status: DC

## 2011-04-09 NOTE — Discharge Summary (Signed)
Obstetric Discharge Summary Reason for Admission: rupture of membranes Prenatal Procedures: ultrasound Intrapartum Procedures: spontaneous vaginal delivery and GBS prophylaxis Postpartum Procedures: Evaluation for preeclampsia Complications-Operative and Postpartum: none Hemoglobin  Date Value Range Status  04/07/2011 9.5* 12.0-15.0 (g/dL) Final     HCT  Date Value Range Status  04/07/2011 28.7* 36.0-46.0 (%) Final    Discharge Diagnoses: Term Pregnancy-delivered and Pregnancy induced hypertension  Discharge Information: Date: 04/09/2011 Activity: unrestricted Diet: routine Medications: PNV, Ibuprophen, Iron, Percocet and Procardia XL 60mg  daily. Contraception:  Plans IUD Condition: stable Instructions: refer to practice specific booklet Discharge to: home Follow-up Information    Follow up with [facility] in 4 weeks.         Newborn Data: Live born female  Birth Weight: 5 lb 6.6 oz (2455 g) APGAR: 9, 9 Breastfeeding  Home with mother.  Elsie Sakuma O. 04/09/2011, 9:33 AM

## 2011-04-09 NOTE — Progress Notes (Signed)
Post Partum Day 3 Subjective: States she is ready to go home.  Reports she is ambulating, voiding and tol po liquids and solids without difficulty.  Pos flatus and BM.  Pumping breastmilk and working on breastfeeding.  Denies headache, visual changes, RUQ pain or edema. Denies weakness or dizziness.  Objective: Blood pressure 132/89, pulse 67, temperature 98 F (36.7 C), temperature source Oral, resp. rate 18, height 5\' 5"  (1.651 m), weight 77.066 kg (169 lb 14.4 oz), SpO2 100.00%, unknown if currently breastfeeding. Filed Vitals:   04/08/11 1615 04/08/11 2015 04/09/11 0538 04/09/11 0705  BP: 124/83 128/75 132/89   Pulse: 96 89 67   Temp: 98.4 F (36.9 C) 98.2 F (36.8 C) 98 F (36.7 C)   TempSrc: Oral Oral Oral   Resp: 18 18 18    Height:      Weight:    77.066 kg (169 lb 14.4 oz)  SpO2:      Last elevated BP occurred on 04/08/11 @ 0821 with value of 148/101.     24hr urine for total protein=220mg .  LFTs and platelets WNL.  Physical Exam:  General: alert, cooperative and no distress.  Skin warm and dry.  Color satisfactory Heart:  Reg rate/rhythm. Lungs:  Clear to A/P ausc bilaterally Breasts:  Soft/nontender Abd:  Soft/nontender with pos bowel sounds bilaterally Lochia: appropriate scant rubra Uterine Fundus: firm, nontender 1 below umbilicus Incision: None, perineum without edema DVT Evaluation: No evidence of DVT seen on physical exam. Negative Homan's sign bilat. No significant calf/ankle edema. DTRs 1+, no clonus  Basename 04/07/11 0010  HGB 9.5*  HCT 28.7*    Assessment/Plan: Stable s/p vaginal delivery Pregnancy induced hypertension-improved on medication  Discharge home and Contraception IUD Continue Procardia XL 60mg  daily. RTO 4wks for postpartum follow up and cultures for IUD placement.   LOS: 4 days   Shanetta Nicolls O. 04/09/2011, 9:37 AM

## 2011-05-12 ENCOUNTER — Encounter (HOSPITAL_COMMUNITY): Payer: Self-pay

## 2011-05-12 ENCOUNTER — Other Ambulatory Visit: Payer: Self-pay | Admitting: Obstetrics and Gynecology

## 2011-05-12 ENCOUNTER — Inpatient Hospital Stay (HOSPITAL_COMMUNITY)
Admission: AD | Admit: 2011-05-12 | Discharge: 2011-05-12 | Disposition: A | Discharge status: Home / Self Care | Payer: Medicaid Other | Source: Ambulatory Visit | Attending: Obstetrics and Gynecology | Admitting: Obstetrics and Gynecology

## 2011-05-12 DIAGNOSIS — K5900 Constipation, unspecified: Secondary | ICD-10-CM | POA: Insufficient documentation

## 2011-05-12 DIAGNOSIS — O99893 Other specified diseases and conditions complicating puerperium: Secondary | ICD-10-CM | POA: Insufficient documentation

## 2011-05-12 DIAGNOSIS — R109 Unspecified abdominal pain: Secondary | ICD-10-CM | POA: Insufficient documentation

## 2011-05-12 HISTORY — DX: Gestational (pregnancy-induced) hypertension without significant proteinuria, unspecified trimester: O13.9

## 2011-05-12 LAB — URINALYSIS, ROUTINE W REFLEX MICROSCOPIC
Hgb urine dipstick: NEGATIVE
Specific Gravity, Urine: 1.025 (ref 1.005–1.030)
Urobilinogen, UA: 0.2 mg/dL (ref 0.0–1.0)
pH: 5.5 (ref 5.0–8.0)

## 2011-05-12 LAB — COMPREHENSIVE METABOLIC PANEL
BUN: 14 mg/dL (ref 6–23)
Calcium: 9.5 mg/dL (ref 8.4–10.5)
Creatinine, Ser: 0.92 mg/dL (ref 0.50–1.10)
GFR calc Af Amer: >90 mL/min (ref >90)
GFR calc non Af Amer: 84 mL/min — ABNORMAL LOW (ref >90)
Glucose, Bld: 85 mg/dL (ref 70–99)
Total Protein: 7.4 g/dL (ref 6.0–8.3)

## 2011-05-12 LAB — WET PREP, GENITAL: Yeast Wet Prep HPF POC: NONE SEEN

## 2011-05-12 LAB — DIFFERENTIAL
Eosinophils Absolute: 0.2 10*3/uL (ref 0.0–0.7)
Eosinophils Relative: 3 % (ref 0–5)
Lymphs Abs: 2.2 10*3/uL (ref 0.7–4.0)
Monocytes Absolute: 0.4 10*3/uL (ref 0.1–1.0)
Monocytes Relative: 6 % (ref 3–12)

## 2011-05-12 LAB — CBC
HCT: 35.5 % — ABNORMAL LOW (ref 36.0–46.0)
Hemoglobin: 11.5 g/dL — ABNORMAL LOW (ref 12.0–15.0)
MCH: 27.8 pg (ref 26.0–34.0)
MCV: 85.7 fL (ref 78.0–100.0)
Platelets: 255 10*3/uL (ref 150–400)
RBC: 4.14 MIL/uL (ref 3.87–5.11)

## 2011-05-12 LAB — RPR: RPR Ser Ql: NONREACTIVE

## 2011-05-12 MED ORDER — CYCLOBENZAPRINE HCL 10 MG PO TABS: 10.0000 mg | ORAL_TABLET | Freq: Three times a day (TID) | ORAL | Status: AC | PRN

## 2011-05-12 MED ORDER — BISACODYL 10 MG RE SUPP: 10.0000 mg | Freq: Every day | RECTAL | Status: AC | PRN

## 2011-05-12 MED ORDER — BISACODYL 10 MG RE SUPP
10.0000 mg | Freq: Every day | RECTAL | Status: DC | PRN
  Administered 2011-05-12: 10 mg via RECTAL
  Filled 2011-05-12: qty 1

## 2011-05-12 NOTE — Progress Notes (Signed)
Pt states sharp abd cramping (generalized), started at 0730 this am. Denies bleeding or vag d/c changes. Denies recent intercourse. Denies burning or urgency with voiding.

## 2011-05-12 NOTE — Progress Notes (Signed)
Patient had a vaginal deliver on 9-19.

## 2011-05-12 NOTE — ED Provider Notes (Signed)
History   30 yo [id number] s/p SVB 04/06/11 presented c/o abdominal pain since 7am.  Denies N, V, diarrhea, fever, dysuria, bleeding.  Reports constipation, with no BM since Monday.  Breastfeeding.  No IC since before delivery.  Chief Complaint  Patient presents with  . Abdominal Pain     OB History    Grav Para Term Preterm Abortions TAB SAB Ect Mult Living   7 2 1 1 5 1 4  0 0 2      Past Medical History  Diagnosis Date  . No pertinent past medical history   . Hypertension   . Headache   . Pregnancy induced hypertension     Past Surgical History  Procedure Date  . No past surgeries   . Multiple abortions   . Leep     Family History  Problem Relation Age of Onset  . Asthma Mother   . Hypertension Mother   . Asthma Maternal Grandmother     History  Substance Use Topics  . Smoking status: Never Smoker   . Smokeless tobacco: Never Used  . Alcohol Use: No    Allergies: No Known Allergies  No prescriptions prior to admission     Physical Exam   Blood pressure 124/71, pulse 61, temperature 97.6 F (36.4 C), temperature source Oral, resp. rate 16, height 5\' 5"  (1.651 m), weight 73.392 kg (161 lb 12.8 oz), SpO2 99.00%, currently breastfeeding.  Chest clear Heart RRR without murmur Abd--upper abdomen mildly bloated and full, tympanic to palpation.  Positive bowel sounds.  No rebound or guarding, negative Mc Burney's sign, no peritoneal irritation sign. Pelvic--No blood in vault. Cervix closed, NT.  No CMT.  Uterus well-involuted, NT. Back--No CVAT Extremities--WNL, negative Homan's  LABS: Results for orders placed during the hospital encounter of 05/12/11 (from the past 24 hour(s))  URINALYSIS, ROUTINE W REFLEX MICROSCOPIC     Status: Normal   Collection Time   05/12/11 10:25 AM      Component Value Range   Color, Urine YELLOW  YELLOW    Appearance CLEAR  CLEAR    Specific Gravity, Urine 1.025  1.005 - 1.030    pH 5.5  5.0 - 8.0    Glucose, UA NEGATIVE   NEGATIVE (mg/dL)   Hgb urine dipstick NEGATIVE  NEGATIVE    Bilirubin Urine NEGATIVE  NEGATIVE    Ketones, ur NEGATIVE  NEGATIVE (mg/dL)   Protein, ur NEGATIVE  NEGATIVE (mg/dL)   Urobilinogen, UA 0.2  0.0 - 1.0 (mg/dL)   Nitrite NEGATIVE  NEGATIVE    Leukocytes, UA NEGATIVE  NEGATIVE   CBC     Status: Abnormal   Collection Time   05/12/11 12:32 PM      Component Value Range   WBC 6.7  4.0 - 10.5 (K/uL)   RBC 4.14  3.87 - 5.11 (MIL/uL)   Hemoglobin 11.5 (*) 12.0 - 15.0 (g/dL)   HCT 11.9 (*) 14.7 - 46.0 (%)   MCV 85.7  78.0 - 100.0 (fL)   MCH 27.8  26.0 - 34.0 (pg)   MCHC 32.4  30.0 - 36.0 (g/dL)   RDW 82.9  56.2 - 13.0 (%)   Platelets 255  150 - 400 (K/uL)  DIFFERENTIAL     Status: Normal   Collection Time   05/12/11 12:32 PM      Component Value Range   Neutrophils Relative 58  43 - 77 (%)   Neutro Abs 3.9  1.7 - 7.7 (K/uL)   Lymphocytes Relative 33  12 - 46 (%)   Lymphs Abs 2.2  0.7 - 4.0 (K/uL)   Monocytes Relative 6  3 - 12 (%)   Monocytes Absolute 0.4  0.1 - 1.0 (K/uL)   Eosinophils Relative 3  0 - 5 (%)   Eosinophils Absolute 0.2  0.0 - 0.7 (K/uL)   Basophils Relative 1  0 - 1 (%)   Basophils Absolute 0.0  0.0 - 0.1 (K/uL)  COMPREHENSIVE METABOLIC PANEL     Status: Abnormal   Collection Time   05/12/11 12:32 PM      Component Value Range   Sodium 139  135 - 145 (mEq/L)   Potassium 3.6  3.5 - 5.1 (mEq/L)   Chloride 104  96 - 112 (mEq/L)   CO2 27  19 - 32 (mEq/L)   Glucose, Bld 85  70 - 99 (mg/dL)   BUN 14  6 - 23 (mg/dL)   Creatinine, Ser 4.78  0.50 - 1.10 (mg/dL)   Calcium 9.5  8.4 - 29.5 (mg/dL)   Total Protein 7.4  6.0 - 8.3 (g/dL)   Albumin 3.4 (*) 3.5 - 5.2 (g/dL)   AST 19  0 - 37 (U/L)   ALT 14  0 - 35 (U/L)   Alkaline Phosphatase 66  39 - 117 (U/L)   Total Bilirubin 0.3  0.3 - 1.2 (mg/dL)   GFR calc non Af Amer 84 (*) >90 (mL/min)   GFR calc Af Amer >90  >90 (mL/min)  WET PREP, GENITAL     Status: Abnormal   Collection Time   05/12/11  1:00 PM       Component Value Range   Yeast, Wet Prep NONE SEEN  NONE SEEN    Trich, Wet Prep NONE SEEN  NONE SEEN    Clue Cells, Wet Prep NONE SEEN  NONE SEEN    WBC, Wet Prep HPF POC MANY (*) NONE SEEN      ED Course  S/P SVD 04/06/11 Constipation  Recommend Dulcolax suppository, stool softeners.  Will use Flexeril prn. Has already stopped using any Percocet. Follow-up with [facility] prn.  Nigel Bridgeman, CNM 05/12/11 1400

## 2011-05-12 NOTE — Progress Notes (Signed)
Patient she started having upper abdominal pain this am. No nausea or vomiting.

## 2011-05-13 LAB — GC/CHLAMYDIA PROBE AMP, GENITAL: GC Probe Amp, Genital: NEGATIVE

## 2012-06-09 ENCOUNTER — Encounter (HOSPITAL_COMMUNITY): Payer: Self-pay | Admitting: *Deleted

## 2012-06-09 ENCOUNTER — Emergency Department (HOSPITAL_COMMUNITY)
Admission: EM | Admit: 2012-06-09 | Discharge: 2012-06-09 | Disposition: A | Discharge status: Home / Self Care | Payer: Self-pay | Attending: Emergency Medicine | Admitting: Emergency Medicine

## 2012-06-09 DIAGNOSIS — R51 Headache: Secondary | ICD-10-CM | POA: Insufficient documentation

## 2012-06-09 DIAGNOSIS — K089 Disorder of teeth and supporting structures, unspecified: Secondary | ICD-10-CM | POA: Insufficient documentation

## 2012-06-09 DIAGNOSIS — I10 Essential (primary) hypertension: Secondary | ICD-10-CM | POA: Insufficient documentation

## 2012-06-09 DIAGNOSIS — K0889 Other specified disorders of teeth and supporting structures: Secondary | ICD-10-CM

## 2012-06-09 MED ORDER — PENICILLIN V POTASSIUM 500 MG PO TABS: 500.0000 mg | ORAL_TABLET | Freq: Three times a day (TID) | ORAL | Status: DC

## 2012-06-09 MED ORDER — HYDROCODONE-ACETAMINOPHEN 5-325 MG PO TABS
2.0000 | ORAL_TABLET | Freq: Once | ORAL | Status: AC
  Administered 2012-06-09: 2 via ORAL
  Filled 2012-06-09: qty 2

## 2012-06-09 MED ORDER — HYDROCODONE-ACETAMINOPHEN 5-500 MG PO TABS: 1.0000 | ORAL_TABLET | Freq: Four times a day (QID) | ORAL | Status: DC | PRN

## 2012-06-09 MED ORDER — PENICILLIN V POTASSIUM 250 MG PO TABS
500.0000 mg | ORAL_TABLET | Freq: Once | ORAL | Status: AC
  Administered 2012-06-09: 500 mg via ORAL
  Filled 2012-06-09: qty 2

## 2012-06-09 NOTE — ED Provider Notes (Signed)
History   This chart was scribed for Geoffery Lyons, MD, by Frederik Pear, ER scribe. The patient was seen in room TR07C/TR07C and the patient's care was started at 1810.    CSN: [id number]  Arrival date & time 06/09/12  1800   First MD Initiated Contact with Patient 06/09/12 1810      Chief Complaint  Patient presents with  . Dental Pain    (Consider location/radiation/quality/duration/timing/severity/associated sxs/prior treatment) HPI Comments: .Brittany [name] is a 31 y.o. female who presents to the Emergency Department complaining of a constant, moderate toothache and associated left-sided lower mouth pain hat began 2 weeks ago. She also reports associated right ear pain. She denies any associated fevers. She states that she does not  currently have a dentist.    Past Medical History  Diagnosis Date  . No pertinent past medical history   . Hypertension   . Headache   . Pregnancy induced hypertension     Past Surgical History  Procedure Date  . No past surgeries   . Multiple abortions   . Leep     Family History  Problem Relation Age of Onset  . Asthma Mother   . Hypertension Mother   . Asthma Maternal Grandmother     History  Substance Use Topics  . Smoking status: Never Smoker   . Smokeless tobacco: Never Used  . Alcohol Use: No    OB History    Grav Para Term Preterm Abortions TAB SAB Ect Mult Living   7 2 1 1 5 1 4  0 0 2      Review of Systems A complete 10 system review of systems was obtained and all systems are negative except as noted in the HPI and PMH.   Allergies  Review of patient's allergies indicates no known allergies.  Home Medications   Current Outpatient Rx  Name  Route  Sig  Dispense  Refill  . NIFEDIPINE ER 60 MG PO TB24   Oral   Take 60 mg by mouth daily.           Marland Kitchen PRENATAL PLUS 27-1 MG PO TABS   Oral   Take 1 tablet by mouth daily.            BP 147/90  Pulse 79  Temp 97.7 F (36.5 C) (Oral)  Resp 16  SpO2  99%  Physical Exam  Nursing note and vitals reviewed. Constitutional: She is oriented to person, place, and time. She appears well-developed and well-nourished. No distress.  HENT:  Head: Normocephalic and atraumatic.       Her left bottom molars have multiple fillings and are tender to palpation. There is mild erythema to the surrounding gum tissue. There are no abscesses noted.  Eyes: EOM are normal. Pupils are equal, round, and reactive to light.  Neck: Normal range of motion. Neck supple. No tracheal deviation present.  Cardiovascular: Normal rate.   Pulmonary/Chest: Effort normal. No respiratory distress.  Abdominal: Soft. She exhibits no distension.  Musculoskeletal: Normal range of motion. She exhibits no edema.  Neurological: She is alert and oriented to person, place, and time.  Skin: Skin is warm and dry.  Psychiatric: She has a normal mood and affect. Her behavior is normal.    ED Course  Procedures (including critical care time)  DIAGNOSTIC STUDIES: Oxygen Saturation is 99% on room air, normal by my interpretation.    COORDINATION OF CARE:  18:18- Discussed planned course of treatment with the patient, including antibiotics and  pain medication,who is agreeable at this time.  Labs Reviewed - No data to display No results found.   No diagnosis found.    MDM  Antibiotics and pain meds.  Follow up with dentist.     I personally performed the services described in this documentation, which was scribed in my presence. The recorded information has been reviewed and is accurate.          Geoffery Lyons, MD 06/09/12 [phone]

## 2012-06-09 NOTE — ED Notes (Signed)
Pt reports left side upper and lower toothache for extended amount of time. Airway intact.

## 2012-08-18 ENCOUNTER — Encounter (HOSPITAL_COMMUNITY): Payer: Self-pay | Admitting: Emergency Medicine

## 2012-08-18 ENCOUNTER — Emergency Department (HOSPITAL_COMMUNITY)
Admission: EM | Admit: 2012-08-18 | Discharge: 2012-08-18 | Disposition: A | Discharge status: Home / Self Care | Payer: Self-pay | Attending: Emergency Medicine | Admitting: Emergency Medicine

## 2012-08-18 DIAGNOSIS — R5381 Other malaise: Secondary | ICD-10-CM | POA: Insufficient documentation

## 2012-08-18 DIAGNOSIS — J111 Influenza due to unidentified influenza virus with other respiratory manifestations: Secondary | ICD-10-CM

## 2012-08-18 DIAGNOSIS — I10 Essential (primary) hypertension: Secondary | ICD-10-CM | POA: Insufficient documentation

## 2012-08-18 DIAGNOSIS — J1189 Influenza due to unidentified influenza virus with other manifestations: Secondary | ICD-10-CM | POA: Insufficient documentation

## 2012-08-18 DIAGNOSIS — R51 Headache: Secondary | ICD-10-CM | POA: Insufficient documentation

## 2012-08-18 NOTE — ED Notes (Signed)
Pt reports cold symptoms. Body aches and headache onset last night.

## 2012-08-18 NOTE — ED Provider Notes (Signed)
I , reviewed the resident's note and I agree with the findings and plan.      Nelia Shi, MD 08/18/12 [phone]

## 2012-08-18 NOTE — ED Provider Notes (Signed)
History     CSN: [id number]  Arrival date & time 08/18/12  4010   First MD Initiated Contact with Patient 08/18/12 [phone]      Chief Complaint  Patient presents with  . URI  . Generalized Body Aches    HPI Patient is a 32 yo otherwise healthy female presenting to the ED with c/o URI-like symptoms, headaches and body aches for last 12 hours getting progressively worse. She states her children have colds, but nobody else with same symptoms. Works at [employer]. She did not have a flu shot this year. Endorses HA, chills, body aches and nasal congestion. Denies N/V, rash or recorded fevers.  Past Medical History  Diagnosis Date  . No pertinent past medical history   . Headache   . Hypertension   . Pregnancy induced hypertension     Past Surgical History  Procedure Date  . No past surgeries   . Multiple abortions   . Leep     Family History  Problem Relation Age of Onset  . Asthma Mother   . Hypertension Mother   . Asthma Maternal Grandmother     History  Substance Use Topics  . Smoking status: Never Smoker   . Smokeless tobacco: Never Used  . Alcohol Use: No    OB History    Grav Para Term Preterm Abortions TAB SAB Ect Mult Living   7 2 1 1 5 1 4  0 0 2      Review of Systems  Constitutional: Positive for chills. Negative for fever.  HENT: Positive for congestion.   Musculoskeletal: Positive for myalgias.  Neurological: Positive for headaches.  All other systems reviewed and are negative.   Allergies  Review of patient's allergies indicates no known allergies.  Home Medications   Current Outpatient Rx  Name  Route  Sig  Dispense  Refill  . ETONOGESTREL 68 MG [place] IMPL   Subcutaneous   Inject 1 each into the skin once.         . COLD/FLU DAYTIME PO   Oral   Take 650 mg by mouth every 6 (six) hours as needed. For cold/flu symptoms           BP 138/94  Pulse 71  Temp 97.9 F (36.6 C) (Oral)  Resp 16  SpO2 98%  Physical Exam  Constitutional:  She is oriented to person, place, and time. She appears well-developed and well-nourished.       Appears uncomfortable  HENT:  Head: Normocephalic and atraumatic.  Right Ear: External ear normal.  Left Ear: External ear normal.  Mouth/Throat: Oropharynx is clear and moist. No oropharyngeal exudate.  Eyes: Pupils are equal, round, and reactive to light.  Neck: Normal range of motion. Neck supple.  Cardiovascular: Normal rate and regular rhythm.   No murmur heard. Pulmonary/Chest: Effort normal. She has no wheezes.  Abdominal: Soft. There is no tenderness.  Musculoskeletal: Normal range of motion. She exhibits no edema.  Lymphadenopathy:    She has no cervical adenopathy.  Neurological: She is alert and oriented to person, place, and time. Coordination normal.  Skin: Skin is warm and dry. No rash noted.    ED Course  Procedures (including critical care time)  Labs Reviewed - No data to display No results found.   1. Influenza    MDM  32 yo F with flu-like illness. Discussed risks and benefits of Tamiflu, and given the low severity of symptoms, cost and based on literature it was decided to  treat symptomatically with Tylenol, OTC medications, rest and fluids. Patient agrees with this plan. Given work note to return in 5 days.  Return to ED if symptoms worsen.  Prabhjot Piscitello M. Nysha Koplin, M.D. 08/18/2012 9:20 AM    Joice Lofts Nydia Bouton, MD 08/18/12 [phone]

## 2012-10-09 ENCOUNTER — Encounter (HOSPITAL_COMMUNITY): Payer: Self-pay | Admitting: Emergency Medicine

## 2012-10-09 ENCOUNTER — Emergency Department (HOSPITAL_COMMUNITY)
Admission: EM | Admit: 2012-10-09 | Discharge: 2012-10-09 | Disposition: A | Discharge status: Home / Self Care | Payer: Self-pay | Attending: Emergency Medicine | Admitting: Emergency Medicine

## 2012-10-09 ENCOUNTER — Emergency Department (HOSPITAL_COMMUNITY): Payer: Self-pay

## 2012-10-09 DIAGNOSIS — M549 Dorsalgia, unspecified: Secondary | ICD-10-CM

## 2012-10-09 DIAGNOSIS — M546 Pain in thoracic spine: Secondary | ICD-10-CM | POA: Insufficient documentation

## 2012-10-09 DIAGNOSIS — Z79899 Other long term (current) drug therapy: Secondary | ICD-10-CM | POA: Insufficient documentation

## 2012-10-09 DIAGNOSIS — I10 Essential (primary) hypertension: Secondary | ICD-10-CM | POA: Insufficient documentation

## 2012-10-09 MED ORDER — HYDROCODONE-ACETAMINOPHEN 5-325 MG PO TABS: 2.0000 | ORAL_TABLET | ORAL | Status: DC | PRN

## 2012-10-09 MED ORDER — KETOROLAC TROMETHAMINE 60 MG/2ML IM SOLN
60.0000 mg | Freq: Once | INTRAMUSCULAR | Status: AC
  Administered 2012-10-09: 60 mg via INTRAMUSCULAR
  Filled 2012-10-09: qty 2

## 2012-10-09 MED ORDER — CYCLOBENZAPRINE HCL 10 MG PO TABS: 10.0000 mg | ORAL_TABLET | Freq: Two times a day (BID) | ORAL | Status: DC | PRN

## 2012-10-09 MED ORDER — DIAZEPAM 5 MG PO TABS
5.0000 mg | ORAL_TABLET | Freq: Once | ORAL | Status: AC
  Administered 2012-10-09: 5 mg via ORAL
  Filled 2012-10-09: qty 1

## 2012-10-09 NOTE — ED Provider Notes (Signed)
Medical screening examination/treatment/procedure(s) were performed by non-physician practitioner and as supervising physician I was immediately available for consultation/collaboration.  Lyanne Co, MD 10/09/12 [phone]

## 2012-10-09 NOTE — ED Notes (Signed)
Ortho paged. 

## 2012-10-09 NOTE — ED Provider Notes (Signed)
History     CSN: [id number]  Arrival date & time 10/09/12  2130   First MD Initiated Contact with Patient 10/09/12 1014      Chief Complaint  Patient presents with  . Back Pain    (Consider location/radiation/quality/duration/timing/severity/associated sxs/prior treatment) HPI Comments: Patient is a 32 year old female who presents with gradual onset of upper back pain that started 1 month ago. The pain is aching and severe and does not radiate. The pain is constant. Movement makes the pain worse. Nothing makes the pain better. Patient has not tried anything for pain. No associated symptoms. No saddles paresthesias or bladder/bowel incontinence. She reports heaving lifting at work which she thinks has contributed to the pain.      Past Medical History  Diagnosis Date  . No pertinent past medical history   . Headache   . Hypertension   . Pregnancy induced hypertension     Past Surgical History  Procedure Laterality Date  . No past surgeries    . Multiple abortions    . Leep      Family History  Problem Relation Age of Onset  . Asthma Mother   . Hypertension Mother   . Asthma Maternal Grandmother     History  Substance Use Topics  . Smoking status: Never Smoker   . Smokeless tobacco: Never Used  . Alcohol Use: No    OB History   Grav Para Term Preterm Abortions TAB SAB Ect Mult Living   7 2 1 1 5 1 4  0 0 2      Review of Systems  Musculoskeletal: Positive for back pain.  All other systems reviewed and are negative.    Allergies  Review of patient's allergies indicates no known allergies.  Home Medications   Current Outpatient Rx  Name  Route  Sig  Dispense  Refill  . aspirin 325 MG tablet   Oral   Take 1,300 mg by mouth daily as needed for pain.         Marland Kitchen etonogestrel (IMPLANON) 68 MG IMPL implant   Subcutaneous   Inject 1 each into the skin once.         Marland Kitchen ibuprofen (ADVIL,MOTRIN) 200 MG tablet   Oral   Take 800 mg by mouth every 6 (six)  hours as needed for pain.         . Multiple Vitamin (MULTIVITAMIN WITH MINERALS) TABS   Oral   Take 1 tablet by mouth daily.           BP 131/75  Pulse 79  Temp(Src) 97.6 F (36.4 C) (Oral)  Resp 18  SpO2 97%  Breastfeeding? Yes  Physical Exam  Nursing note and vitals reviewed. Constitutional: She is oriented to person, place, and time. She appears well-developed and well-nourished. No distress.  HENT:  Head: Normocephalic and atraumatic.  Eyes: Conjunctivae are normal.  Neck: Normal range of motion.  Cardiovascular: Normal rate and regular rhythm.  Exam reveals no gallop and no friction rub.   No murmur heard. Pulmonary/Chest: Effort normal and breath sounds normal. She has no wheezes. She has no rales. She exhibits no tenderness.  Abdominal: Soft. There is no tenderness.  Musculoskeletal: Normal range of motion.  Generalized paraspinal thoracic tenderness palpation. No obvious deformity.   Neurological: She is alert and oriented to person, place, and time. Coordination normal.  Upper extremity strength and sensation equal and intact bilaterally. Speech is goal-oriented. Moves limbs without ataxia.   Skin: Skin is  warm and dry.  Psychiatric: She has a normal mood and affect. Her behavior is normal.    ED Course  Procedures (including critical care time)  Labs Reviewed - No data to display Dg Thoracic Spine W/swimmers  10/09/2012  *RADIOLOGY REPORT*  Clinical Data: Thoracic pain, no known injury  THORACIC SPINE - 2 VIEW + SWIMMERS  Comparison: None.  Findings: Normal thoracic kyphosis.  No evidence of fracture or dislocation.  Vertebral body heights and intervertebral disc spaces are maintained.  Visualized lungs are clear.  IMPRESSION: Normal thoracic spine radiographs.   Original Report Authenticated By: Charline Bills, M.D.      1. Back pain       MDM  10:15 AM Xray of thoracic spine pending. Patient will have toradol and valium for pain. Vitals stable and  patient afebrile.    11:39 AM Xray unremarkable. Patient's pain likely caused by musculoskeletal strain. Patient will have prescription for norco and flexeril. Patient instructed to apply heat and no heavy lifting until symptoms resolve. No further evaluation needed at this time.        Emilia Beck, PA-C 10/09/12 1321

## 2012-10-09 NOTE — ED Notes (Signed)
Pt reports reports back pain for the last 3-4 weeks in mid to upper back because she does heavy lifting at work. States for the last several nights has been unable to sleep at night due to pain. Denies dysuria, incontinence, fevers, numbness. Pt with steady gait. NAD.

## 2012-10-09 NOTE — ED Notes (Signed)
Informed STacey , PA that pt. Is breast feeing and she is unable to take the medications.  The prescriptions for the pain medication has been removed from her instructions and shredded. STacey suggested that she takes Tylenol as directed on the bottle for breast feeding mothers. Pt. Verbalized understanding

## 2013-01-10 ENCOUNTER — Emergency Department (HOSPITAL_COMMUNITY)
Admission: EM | Admit: 2013-01-10 | Discharge: 2013-01-10 | Disposition: A | Discharge status: Home / Self Care | Payer: Self-pay | Attending: Emergency Medicine | Admitting: Emergency Medicine

## 2013-01-10 ENCOUNTER — Encounter (HOSPITAL_COMMUNITY): Payer: Self-pay | Admitting: Emergency Medicine

## 2013-01-10 DIAGNOSIS — R42 Dizziness and giddiness: Secondary | ICD-10-CM | POA: Insufficient documentation

## 2013-01-10 DIAGNOSIS — I10 Essential (primary) hypertension: Secondary | ICD-10-CM | POA: Insufficient documentation

## 2013-01-10 DIAGNOSIS — Z79899 Other long term (current) drug therapy: Secondary | ICD-10-CM | POA: Insufficient documentation

## 2013-01-10 DIAGNOSIS — R519 Headache, unspecified: Secondary | ICD-10-CM

## 2013-01-10 DIAGNOSIS — R51 Headache: Secondary | ICD-10-CM | POA: Insufficient documentation

## 2013-01-10 DIAGNOSIS — J3489 Other specified disorders of nose and nasal sinuses: Secondary | ICD-10-CM | POA: Insufficient documentation

## 2013-01-10 MED ORDER — ACETAMINOPHEN 500 MG PO TABS
1000.0000 mg | ORAL_TABLET | Freq: Once | ORAL | Status: AC
  Administered 2013-01-10: 1000 mg via ORAL
  Filled 2013-01-10: qty 2

## 2013-01-10 MED ORDER — ACETAMINOPHEN 500 MG PO TABS: 500.0000 mg | ORAL_TABLET | Freq: Four times a day (QID) | ORAL | Status: DC | PRN

## 2013-01-10 NOTE — ED Provider Notes (Signed)
History    CSN: [id number] Arrival date & time 01/10/13  4098  First MD Initiated Contact with Patient 01/10/13 [phone]     Chief Complaint  Patient presents with  . Headache   (Consider location/radiation/quality/duration/timing/severity/associated sxs/prior Treatment) HPI Pt is a 32yo female presenting with headache that gradually came on 3 days ago.  States pain is in frontal portion of head, described as pressure, constant 8/10, associated with dizziness that started this morning.   States she does get occasional headaches and this feels like previous headaches. Also reports hx of seasonal allergies and mild nasal congestion.  Tried acetaminophen 1 time the other day. States it did seem to help, however began to wear off and she did not take a second dose.  States she is breastfeeding her 1yo child and states she is not sure how much medicine she can take for pain. Reports last time she had a "bad" headache, it took 800mg  of "pain medicine."       Past Medical History  Diagnosis Date  . No pertinent past medical history   . Headache(784.0)   . Hypertension   . Pregnancy induced hypertension    Past Surgical History  Procedure Laterality Date  . No past surgeries    . Multiple abortions    . Leep     Family History  Problem Relation Age of Onset  . Asthma Mother   . Hypertension Mother   . Asthma Maternal Grandmother    History  Substance Use Topics  . Smoking status: Never Smoker   . Smokeless tobacco: Never Used  . Alcohol Use: No   OB History   Grav Para Term Preterm Abortions TAB SAB Ect Mult Living   7 2 1 1 5 1 4  0 0 2     Review of Systems  Constitutional: Negative for fever, chills, diaphoresis and fatigue.  Eyes: Negative for photophobia, pain and visual disturbance.  Gastrointestinal: Negative for nausea and vomiting.  Neurological: Positive for dizziness, light-headedness and headaches. Negative for tremors, seizures, syncope, facial asymmetry, speech  difficulty, weakness and numbness.  All other systems reviewed and are negative.    Allergies  Review of patient's allergies indicates no known allergies.  Home Medications   Current Outpatient Rx  Name  Route  Sig  Dispense  Refill  . etonogestrel (IMPLANON) 68 MG IMPL implant   Subcutaneous   Inject 1 each into the skin once.         Marland Kitchen ibuprofen (ADVIL,MOTRIN) 200 MG tablet   Oral   Take 400 mg by mouth every 6 (six) hours as needed for pain (headache).          . Multiple Vitamin (MULTIVITAMIN WITH MINERALS) TABS   Oral   Take 1 tablet by mouth daily.         Marland Kitchen acetaminophen (TYLENOL) 500 MG tablet   Oral   Take 1 tablet (500 mg total) by mouth every 6 (six) hours as needed for pain (May take 1-2 tablets every 6 hours as needed for pain.  Do not exceed 4,000mg  per day.).   30 tablet   0    BP 132/81  Pulse 74  Temp(Src) 98.5 F (36.9 C)  Resp 16  SpO2 99% Physical Exam  Nursing note and vitals reviewed. Constitutional: She is oriented to person, place, and time. She appears well-developed and well-nourished. No distress.  Pt resting comfortably in exam room   HENT:  Head: Normocephalic and atraumatic.  Right Ear:  Hearing, tympanic membrane, external ear and ear canal normal.  Left Ear: Hearing, tympanic membrane, external ear and ear canal normal.  Nose: Mucosal edema present. No rhinorrhea.  Mouth/Throat: Uvula is midline, oropharynx is clear and moist and mucous membranes are normal. No oropharyngeal exudate, posterior oropharyngeal edema or posterior oropharyngeal erythema.  No temporal artery tenderness.   Eyes: Conjunctivae and EOM are normal. Pupils are equal, round, and reactive to light. Right eye exhibits no discharge. Left eye exhibits no discharge. No scleral icterus.  Neck: Normal range of motion. Neck supple.  No nuchal rigidity or meningeal signs.   Cardiovascular: Normal rate, regular rhythm and normal heart sounds.   Pulmonary/Chest:  Effort normal and breath sounds normal. No respiratory distress. She has no wheezes. She has no rales. She exhibits no tenderness.  Musculoskeletal: Normal range of motion.  Neurological: She is alert and oriented to person, place, and time. She has normal strength. No cranial nerve deficit or sensory deficit. She displays a negative Romberg sign. Coordination and gait normal. GCS eye subscore is 4. GCS verbal subscore is 5. GCS motor subscore is 6.  CN II-XII in tact, no focal deficit, nl finger to nose coordination. Nl sensation, 5/5 strength in all major muscle groups. Neg romberg and nl gait.    Skin: Skin is warm and dry. She is not diaphoretic.  Psychiatric: She has a normal mood and affect. Her behavior is normal.    ED Course  Procedures (including critical care time) Labs Reviewed - No data to display No results found. 1. Headache   2. Dizziness     MDM  Not concerned for [facility] or temporal arteritis.  Neuro exam: nl.  Symptoms not vertiginous in nature.  Do not believe imaging is needed at this time.  H&P most consistent with sinus headache.  Will tx with acetaminophen 1g.  Pt denies nausea or dizziness at this time.   9:51 AMPt states headache is still there but "much better" than before.  Will discharge pt home Rx: acetaminophen. Discussed OTC nasal saline and rinses with pt to also help alleviate symptoms.  F/u with PCP, [facility] info provided. Return precautions given. Pt verbalized understanding and agreement with tx plan. Vitals: unremarkable. Discharged in stable condition.    Discussed pt with attending during ED encounter.     Junius Finner, PA-C 01/10/13 [phone]

## 2013-01-10 NOTE — ED Notes (Addendum)
H/a x 2-3 days woke up dizzy denies n/v mostly frontal forehead denies injury sttaes does not have periods because of birth control in arm

## 2013-01-11 NOTE — ED Provider Notes (Signed)
Medical screening examination/treatment/procedure(s) were performed by non-physician practitioner and as supervising physician I was immediately available for consultation/collaboration.  Juliet Rude. Rubin Payor, MD 01/11/13 0730

## 2013-02-08 ENCOUNTER — Emergency Department (HOSPITAL_COMMUNITY)
Admission: EM | Admit: 2013-02-08 | Discharge: 2013-02-08 | Disposition: A | Discharge status: Home / Self Care | Payer: Self-pay | Attending: Emergency Medicine | Admitting: Emergency Medicine

## 2013-02-08 ENCOUNTER — Encounter (HOSPITAL_COMMUNITY): Payer: Self-pay

## 2013-02-08 DIAGNOSIS — T148XXA Other injury of unspecified body region, initial encounter: Secondary | ICD-10-CM

## 2013-02-08 DIAGNOSIS — S8010XA Contusion of unspecified lower leg, initial encounter: Secondary | ICD-10-CM | POA: Insufficient documentation

## 2013-02-08 DIAGNOSIS — Y929 Unspecified place or not applicable: Secondary | ICD-10-CM | POA: Insufficient documentation

## 2013-02-08 DIAGNOSIS — I10 Essential (primary) hypertension: Secondary | ICD-10-CM | POA: Insufficient documentation

## 2013-02-08 DIAGNOSIS — Y939 Activity, unspecified: Secondary | ICD-10-CM | POA: Insufficient documentation

## 2013-02-08 MED ORDER — IBUPROFEN 400 MG PO TABS: 400.0000 mg | ORAL_TABLET | Freq: Four times a day (QID) | ORAL | Status: DC | PRN

## 2013-02-08 MED ORDER — ACETAMINOPHEN 500 MG PO TABS
1000.0000 mg | ORAL_TABLET | Freq: Once | ORAL | Status: AC
  Administered 2013-02-08: 1000 mg via ORAL
  Filled 2013-02-08: qty 2

## 2013-02-08 NOTE — ED Provider Notes (Signed)
CSN: [id number]     Arrival date & time 02/08/13  1727 History  This chart was scribed for non-physician practitioner working with Suzi Roots, MD, by Ardelia Mems ED Scribe. This patient was seen in room TR05C/TR05C.   First MD Initiated Contact with Patient 02/08/13 1829     Chief Complaint  Patient presents with  . Insect Bite    The history is provided by the patient. No language interpreter was used.   HPI Comments: Brittany [name] is a 32 y.o. female who presents to the Emergency Department complaining of a suspected insect bite to her left calf. She states that this morning she noticed an area of bruising to the calf that was not present last night. She reports constant, mild "sharp" pain to the area. She states that she doesn't remember being bit. Nothing makes the pain better. She has not tried any remedies. She denies fever, chills, SOB, nausea, abdominal pain, vomiting or any other symptoms.   Past Medical History  Diagnosis Date  . No pertinent past medical history   . Headache(784.0)   . Hypertension   . Pregnancy induced hypertension    Past Surgical History  Procedure Laterality Date  . No past surgeries    . Multiple abortions    . Leep     Family History  Problem Relation Age of Onset  . Asthma Mother   . Hypertension Mother   . Asthma Maternal Grandmother    History  Substance Use Topics  . Smoking status: Never Smoker   . Smokeless tobacco: Never Used  . Alcohol Use: No   OB History   Grav Para Term Preterm Abortions TAB SAB Ect Mult Living   7 2 1 1 5 1 4  0 0 2     Review of Systems  Constitutional: Negative for fever and chills.  Respiratory: Negative for shortness of breath.   Gastrointestinal: Negative for nausea, vomiting and abdominal pain.  Skin: Positive for wound (bug bite).  All other systems reviewed and are negative.    Allergies  Review of patient's allergies indicates no known allergies.  Home Medications   Current Outpatient  Rx  Name  Route  Sig  Dispense  Refill  . etonogestrel (IMPLANON) 68 MG IMPL implant   Subcutaneous   Inject 1 each into the skin once.          Triage Vitals: BP 125/81  Pulse 66  Temp(Src) 98.6 F (37 C) (Oral)  Resp 18  SpO2 99%  Physical Exam  Nursing note and vitals reviewed. Constitutional: She is oriented to person, place, and time. She appears well-developed and well-nourished. No distress.  HENT:  Head: Normocephalic and atraumatic.  Right Ear: External ear normal.  Left Ear: External ear normal.  Nose: Nose normal.  Mouth/Throat: Oropharynx is clear and moist.  Eyes: Conjunctivae are normal.  Neck: Normal range of motion.  Cardiovascular: Normal rate, regular rhythm and normal heart sounds.   No swelling to lower extremity.   Pulmonary/Chest: Effort normal and breath sounds normal. No stridor. No respiratory distress. She has no wheezes. She has no rales.  Abdominal: Soft. She exhibits no distension.  Musculoskeletal: Normal range of motion.  Neurological: She is alert and oriented to person, place, and time. She has normal strength.  Skin: Skin is warm and dry. She is not diaphoretic. No erythema.  3 cm bruise to posterior left calf.  Psychiatric: She has a normal mood and affect. Her behavior is normal.  ED Course   Procedures (including critical care time)  DIAGNOSTIC STUDIES: Oxygen Saturation is 99% on RA, normal by my interpretation.    COORDINATION OF CARE: 6:39 PM- Pt advised of plan for treatment and pt agrees. Nsaids, ice No abscess, or collection of fluid, no induration   Labs Reviewed - No data to display  No results found.  1. Bruise     MDM  Patient presents with a bruise to her calf. She is not on any blood thinners. Pain is worse with walking and palpation. Discussed ice, rest, elevation, tylenol. Return instructions given. Vital signs stable for discharge. Patient / Family / Caregiver informed of clinical course, understand  medical decision-making process, and agree with plan.      I personally performed the services described in this documentation, which was scribed in my presence. The recorded information has been reviewed and is accurate.     Mora Bellman, PA-C 02/09/13 [phone]

## 2013-02-08 NOTE — ED Notes (Signed)
Woke up this am with a ?possible insect bite to lt. Calf area.  Swollen and bruised

## 2013-02-12 NOTE — ED Provider Notes (Signed)
Medical screening examination/treatment/procedure(s) were performed by non-physician practitioner and as supervising physician I was immediately available for consultation/collaboration.   Suzi Roots, MD 02/12/13 [phone]

## 2013-03-18 ENCOUNTER — Encounter (HOSPITAL_COMMUNITY): Payer: Self-pay | Admitting: *Deleted

## 2013-03-18 ENCOUNTER — Emergency Department (HOSPITAL_COMMUNITY)
Admission: EM | Admit: 2013-03-18 | Discharge: 2013-03-18 | Disposition: A | Discharge status: Home / Self Care | Payer: Medicaid Other | Attending: Emergency Medicine | Admitting: Emergency Medicine

## 2013-03-18 DIAGNOSIS — R21 Rash and other nonspecific skin eruption: Secondary | ICD-10-CM

## 2013-03-18 DIAGNOSIS — L988 Other specified disorders of the skin and subcutaneous tissue: Secondary | ICD-10-CM | POA: Insufficient documentation

## 2013-03-18 DIAGNOSIS — Z8742 Personal history of other diseases of the female genital tract: Secondary | ICD-10-CM | POA: Insufficient documentation

## 2013-03-18 DIAGNOSIS — Z79899 Other long term (current) drug therapy: Secondary | ICD-10-CM | POA: Insufficient documentation

## 2013-03-18 DIAGNOSIS — I10 Essential (primary) hypertension: Secondary | ICD-10-CM | POA: Insufficient documentation

## 2013-03-18 DIAGNOSIS — Z8669 Personal history of other diseases of the nervous system and sense organs: Secondary | ICD-10-CM | POA: Insufficient documentation

## 2013-03-18 DIAGNOSIS — L299 Pruritus, unspecified: Secondary | ICD-10-CM | POA: Insufficient documentation

## 2013-03-18 MED ORDER — DIPHENHYDRAMINE HCL 25 MG PO TABS: 25.0000 mg | ORAL_TABLET | Freq: Four times a day (QID) | ORAL | Status: DC

## 2013-03-18 MED ORDER — PERMETHRIN 5 % EX CREA: TOPICAL_CREAM | CUTANEOUS | Status: DC

## 2013-03-18 MED ORDER — HYDROCORTISONE 1 % EX CREA: TOPICAL_CREAM | CUTANEOUS | Status: DC

## 2013-03-18 NOTE — ED Notes (Signed)
Pt presents with rash on bilateral legs, arms, and abdomen x 2 days. Denies new lotion, laundry detergent, being bitten by a bug. Has not taken anything for the rash.

## 2013-03-18 NOTE — ED Provider Notes (Signed)
CSN: [id number]     Arrival date & time 03/18/13  1804 History  This chart was scribed for non-physician practitioner Fayrene Helper, PA-C, working with Ward Givens, MD by Dorothey Baseman, ED Scribe. This patient was seen in room TR06C/TR06C and the patient's care was started at 6:18 PM.    Chief Complaint  Patient presents with  . Rash    Patient is a 32 y.o. female presenting with rash. The history is provided by the patient. No language interpreter was used.  Rash Location:  Shoulder/arm, torso and leg Shoulder/arm rash location:  L forearm Torso rash location:  Abd LLQ Leg rash location:  R upper leg Onset quality:  Sudden Progression:  Worsening Chronicity:  New Relieved by:  None tried Ineffective treatments:  None tried Associated symptoms: no diarrhea, no nausea, no shortness of breath and not vomiting    HPI Comments: Brittany [name] is a 32 y.o. female who presents to the Emergency Department complaining of an itching rash onset 2 days ago that started on her right thigh and has spread to her left arm and right abdomen. She denies any recent changes in her home life that could produce a rash. She denies chest pain, shortness of breath, trouble swallowing, nausea, vomiting, or diarrhea.   Past Medical History  Diagnosis Date  . No pertinent past medical history   . Headache(784.0)   . Hypertension   . Pregnancy induced hypertension    Past Surgical History  Procedure Laterality Date  . No past surgeries    . Multiple abortions    . Leep     Family History  Problem Relation Age of Onset  . Asthma Mother   . Hypertension Mother   . Asthma Maternal Grandmother    History  Substance Use Topics  . Smoking status: Never Smoker   . Smokeless tobacco: Never Used  . Alcohol Use: No   OB History   Grav Para Term Preterm Abortions TAB SAB Ect Mult Living   7 2 1 1 5 1 4  0 0 2     Review of Systems  HENT: Negative for trouble swallowing.   Respiratory: Negative for shortness of  breath.   Cardiovascular: Negative for chest pain.  Gastrointestinal: Negative for nausea, vomiting and diarrhea.  Skin: Positive for rash.  All other systems reviewed and are negative.    Allergies  Review of patient's allergies indicates no known allergies.  Home Medications   Current Outpatient Rx  Name  Route  Sig  Dispense  Refill  . etonogestrel (IMPLANON) 68 MG IMPL implant   Subcutaneous   Inject 1 each into the skin once.         Marland Kitchen ibuprofen (ADVIL,MOTRIN) 400 MG tablet   Oral   Take 1 tablet (400 mg total) by mouth every 6 (six) hours as needed for pain.   30 tablet   0    Triage Vitals: BP 154/99  Pulse 79  Temp(Src) 98 F (36.7 C) (Oral)  Resp 18  Ht 5\' 5"  (1.651 m)  Wt 174 lb (78.926 kg)  BMI 28.96 kg/m2  SpO2 99%  Physical Exam  Nursing note and vitals reviewed. Constitutional: She is oriented to person, place, and time. She appears well-developed and well-nourished. No distress.  HENT:  Head: Normocephalic and atraumatic.  No rash in the mouth.  Eyes: Conjunctivae are normal.  Neck: Normal range of motion. Neck supple.  Musculoskeletal: Normal range of motion.  Neurological: She is alert and oriented  to person, place, and time.  Skin: Skin is warm and dry. Rash noted.  Multiple small maculopapular lesions to her right thigh, right abdomen, left forearm, and left lower leg. No petechiae, pustula, or vesicular lesions. No rash to sole of the foot or between the fingers.   Psychiatric: She has a normal mood and affect. Her behavior is normal.    ED Course  Procedures (including critical care time)  DIAGNOSTIC STUDIES: Oxygen Saturation is 99% on room air, normal by my interpretation.    COORDINATION OF CARE: 6:21PM- Advised patient to take Benadryl and use hydrocortisone cream at home to treat symptoms. Will order a cream to treat for possible Scabies. Advised patient to follow up if there are any new or worsening symptoms. Discussed treatment  plan with patient at bedside and patient verbalized agreement.   Labs Review Labs Reviewed - No data to display Imaging Review No results found.  MDM   1. Rash and nonspecific skin eruption    BP 154/99  Pulse 79  Temp(Src) 98 F (36.7 C) (Oral)  Resp 18  Ht 5\' 5"  (1.651 m)  Wt 174 lb (78.926 kg)  BMI 28.96 kg/m2  SpO2 99%   I personally performed the services described in this documentation, which was scribed in my presence. The recorded information has been reviewed and is accurate.     Fayrene Helper, PA-C 03/18/13 1826

## 2013-03-18 NOTE — ED Notes (Signed)
Pt is here with spreading bumps to arms legs and sides for last 2 days

## 2013-03-18 NOTE — ED Provider Notes (Signed)
Medical screening examination/treatment/procedure(s) were performed by non-physician practitioner and as supervising physician I was immediately available for consultation/collaboration. Irelyn Perfecto, MD, FACEP   Louis Ivery L Lealer Marsland, MD 03/18/13 2326 

## 2013-05-31 ENCOUNTER — Encounter (HOSPITAL_COMMUNITY): Payer: Self-pay | Admitting: Emergency Medicine

## 2013-05-31 ENCOUNTER — Emergency Department (HOSPITAL_COMMUNITY)
Admission: EM | Admit: 2013-05-31 | Discharge: 2013-05-31 | Disposition: A | Discharge status: Home / Self Care | Payer: Self-pay | Attending: Emergency Medicine | Admitting: Emergency Medicine

## 2013-05-31 DIAGNOSIS — R1310 Dysphagia, unspecified: Secondary | ICD-10-CM | POA: Insufficient documentation

## 2013-05-31 DIAGNOSIS — K089 Disorder of teeth and supporting structures, unspecified: Secondary | ICD-10-CM | POA: Insufficient documentation

## 2013-05-31 DIAGNOSIS — Z8669 Personal history of other diseases of the nervous system and sense organs: Secondary | ICD-10-CM | POA: Insufficient documentation

## 2013-05-31 DIAGNOSIS — K0889 Other specified disorders of teeth and supporting structures: Secondary | ICD-10-CM

## 2013-05-31 DIAGNOSIS — Z79899 Other long term (current) drug therapy: Secondary | ICD-10-CM | POA: Insufficient documentation

## 2013-05-31 DIAGNOSIS — R220 Localized swelling, mass and lump, head: Secondary | ICD-10-CM | POA: Insufficient documentation

## 2013-05-31 DIAGNOSIS — Z8742 Personal history of other diseases of the female genital tract: Secondary | ICD-10-CM | POA: Insufficient documentation

## 2013-05-31 DIAGNOSIS — I10 Essential (primary) hypertension: Secondary | ICD-10-CM | POA: Insufficient documentation

## 2013-05-31 DIAGNOSIS — K0381 Cracked tooth: Secondary | ICD-10-CM | POA: Insufficient documentation

## 2013-05-31 MED ORDER — CEPHALEXIN 500 MG PO CAPS: 500.0000 mg | ORAL_CAPSULE | Freq: Four times a day (QID) | ORAL | Status: DC

## 2013-05-31 NOTE — ED Provider Notes (Signed)
CSN: [id number]     Arrival date & time 05/31/13  1121 History  This chart was scribed for non-physician practitioner, Raymon Mutton, PA-C, working with Juliet Rude. Rubin Payor, MD by Shari Heritage, ED Scribe. This patient was seen in room TR05C/TR05C and the patient's care was started at 12:39 PM.    Chief Complaint  Patient presents with  . Dental Pain    The history is provided by the patient. No language interpreter was used.    HPI Comments: Brittany [name] is a 32 y.o. female who presents to the Emergency Department complaining of constant throbbing right upper dental pain onset 2 days ago. There is associated facial swelling. She states she cracked her tooth "awhile" ago but can't remember exactly when this occurred. She denies drainage from her gumline, dysphagia, neck pain, neck stiffness, fever, chills, chest pain, or shortness of breath. She has taken ibuprofen for pain without relief. She has not seen a dentist for this problem. She does not smoke. She has a medical history of hypertension. She states that she is currently breastfeeding.    Past Medical History  Diagnosis Date  . No pertinent past medical history   . Headache(784.0)   . Hypertension   . Pregnancy induced hypertension    Past Surgical History  Procedure Laterality Date  . No past surgeries    . Multiple abortions    . Leep     Family History  Problem Relation Age of Onset  . Asthma Mother   . Hypertension Mother   . Asthma Maternal Grandmother    History  Substance Use Topics  . Smoking status: Never Smoker   . Smokeless tobacco: Never Used  . Alcohol Use: No   OB History   Grav Para Term Preterm Abortions TAB SAB Ect Mult Living   7 2 1 1 5 1 4  0 0 2     Review of Systems  Constitutional: Negative for fever and chills.  HENT: Positive for dental problem, facial swelling and trouble swallowing.   Respiratory: Negative for shortness of breath.   Cardiovascular: Negative for chest pain.   Musculoskeletal: Negative for neck pain and neck stiffness.  All other systems reviewed and are negative.    Allergies  Review of patient's allergies indicates no known allergies.  Home Medications   Current Outpatient Rx  Name  Route  Sig  Dispense  Refill  . etonogestrel (IMPLANON) 68 MG IMPL implant   Subcutaneous   Inject 1 each into the skin once.         Marland Kitchen ibuprofen (ADVIL,MOTRIN) 400 MG tablet   Oral   Take 1 tablet (400 mg total) by mouth every 6 (six) hours as needed for pain.   30 tablet   0   . cephALEXin (KEFLEX) 500 MG capsule   Oral   Take 1 capsule (500 mg total) by mouth 4 (four) times daily.   40 capsule   0    Triage Vitals: BP 168/104  Pulse 72  Temp(Src) 98.2 F (36.8 C) (Oral)  Resp 16  Ht 5\' 5"  (1.651 m)  Wt 175 lb (79.379 kg)  BMI 29.12 kg/m2  SpO2 96% Physical Exam  Nursing note and vitals reviewed. Constitutional: She is oriented to person, place, and time. She appears well-developed and well-nourished. No distress.  HENT:  Head: Normocephalic and atraumatic.  Mouth/Throat: Oropharynx is clear and moist. No oropharyngeal exudate.    Negative facial swelling identified. Poor dentition-numerous teeth in decaying process. Right second molar  of maxillary region noted to be diagrammed in decaying process. Pain upon palpation. Negative abscess or cyst formation. Negative trismus. Negative sublingual lesions.  Eyes: Conjunctivae and EOM are normal. Pupils are equal, round, and reactive to light. Right eye exhibits no discharge. Left eye exhibits no discharge.  Neck: Normal range of motion. Neck supple.  Cardiovascular: Normal rate, regular rhythm and normal heart sounds.  Exam reveals no friction rub.   No murmur heard. Pulmonary/Chest: Effort normal and breath sounds normal. No respiratory distress. She has no wheezes. She has no rales.  Lymphadenopathy:    She has no cervical adenopathy.  Neurological: She is alert and oriented to person,  place, and time. No cranial nerve deficit.  Skin: Skin is warm and dry. No rash noted. She is not diaphoretic. No erythema.  Psychiatric: She has a normal mood and affect. Her behavior is normal. Thought content normal.    ED Course  Procedures (including critical care time) DIAGNOSTIC STUDIES: Oxygen Saturation is 96% on room air, adequate by my interpretation.    COORDINATION OF CARE: 12:40 PM- Patient informed of current plan for treatment and evaluation and agrees with plan at this time.     Labs Review Labs Reviewed - No data to display Imaging Review No results found.  EKG Interpretation   None       MDM   1. Pain, dental    Filed Vitals:   05/31/13 1126  BP: 168/104  Pulse: 72  Temp: 98.2 F (36.8 C)  TempSrc: Oral  Resp: 16  Height: 5\' 5"  (1.651 m)  Weight: 175 lb (79.379 kg)  SpO2: 96%   I personally performed the services described in this documentation, which was scribed in my presence. The recorded information has been reviewed and is accurate.  Patient presenting to emergency department with dental pain. Alert and oriented. Negative facial swelling identified. Poor dentition noted. Second molar of right maxillary region diagrammed in decaying process. Negative neck stiffness, negative nuchal rigidity, negative cervical lymphadenopathy. Negative active drainage. Negative sublingual lesions, negative trismus.  Doubt peritonsillar abscess. Doubt Ludwig's angina. Poor dentition with tooth and needs to be extracted. Patient currently breast-feeding-antibiotics administered. Referred patient to dental clinic. Discussed with patient free dental clinic this weekend. Discussed with patient that the only way for the pain to be reduced is for the tooth to be extracted. Discussed with patient to closely monitor symptoms and if symptoms are to worsen or change to report back to emergency department - strict return instructions given. Patient agreed to plan of care,  understood, all questions answered.  Raymon Mutton, PA-C 05/31/13 [phone]

## 2013-05-31 NOTE — ED Notes (Signed)
Pt reports that she has tooth ache and swelling; mild swelling of right side noted.

## 2013-06-03 NOTE — ED Provider Notes (Signed)
Medical screening examination/treatment/procedure(s) were performed by non-physician practitioner and as supervising physician I was immediately available for consultation/collaboration.  EKG Interpretation   None        Juliet Rude. Rubin Payor, MD 06/03/13 [phone]

## 2013-08-01 ENCOUNTER — Encounter (HOSPITAL_COMMUNITY): Payer: Self-pay | Admitting: Emergency Medicine

## 2013-08-01 ENCOUNTER — Emergency Department (HOSPITAL_COMMUNITY)
Admission: EM | Admit: 2013-08-01 | Discharge: 2013-08-01 | Disposition: A | Discharge status: Home / Self Care | Payer: Self-pay | Attending: Emergency Medicine | Admitting: Emergency Medicine

## 2013-08-01 DIAGNOSIS — K089 Disorder of teeth and supporting structures, unspecified: Secondary | ICD-10-CM | POA: Insufficient documentation

## 2013-08-01 DIAGNOSIS — K029 Dental caries, unspecified: Secondary | ICD-10-CM | POA: Insufficient documentation

## 2013-08-01 DIAGNOSIS — R51 Headache: Secondary | ICD-10-CM | POA: Insufficient documentation

## 2013-08-01 DIAGNOSIS — R030 Elevated blood-pressure reading, without diagnosis of hypertension: Secondary | ICD-10-CM

## 2013-08-01 DIAGNOSIS — O139 Gestational [pregnancy-induced] hypertension without significant proteinuria, unspecified trimester: Secondary | ICD-10-CM | POA: Insufficient documentation

## 2013-08-01 DIAGNOSIS — I10 Essential (primary) hypertension: Secondary | ICD-10-CM | POA: Insufficient documentation

## 2013-08-01 DIAGNOSIS — IMO0001 Reserved for inherently not codable concepts without codable children: Secondary | ICD-10-CM

## 2013-08-01 MED ORDER — PENICILLIN V POTASSIUM 500 MG PO TABS: 500.0000 mg | ORAL_TABLET | Freq: Four times a day (QID) | ORAL | Status: DC

## 2013-08-01 MED ORDER — HYDROCODONE-ACETAMINOPHEN 5-325 MG PO TABS
2.0000 | ORAL_TABLET | Freq: Once | ORAL | Status: AC
  Administered 2013-08-01: 2 via ORAL
  Filled 2013-08-01: qty 2

## 2013-08-01 MED ORDER — HYDROCODONE-ACETAMINOPHEN 5-325 MG PO TABS: 2.0000 | ORAL_TABLET | ORAL | Status: DC | PRN

## 2013-08-01 NOTE — ED Provider Notes (Signed)
Medical screening examination/treatment/procedure(s) were performed by non-physician practitioner and as supervising physician I was immediately available for consultation/collaboration.  EKG Interpretation   None         Laken Rog, MD 08/01/13 1749 

## 2013-08-01 NOTE — Progress Notes (Signed)
   CARE MANAGEMENT ED NOTE 08/01/2013  Patient:  Kunath,Terianna   Account Number:  [id number]  Date Initiated:  08/01/2013  Documentation initiated by:  Edd ArbourGIBBS,Jaritza Duignan  Subjective/Objective Assessment:   33 yr old self pay [place] pt d/c from [facility] ED on 08/01/13 for dx dental extraction with Rx for Penvk 500 mg 1 tab by mouth 4 times daily 40 tabs total Cm received a call from her pharmacist, Jomarie LongsJoseph requesting a change to 250 mg     Subjective/Objective Assessment Detail:   vs 500 mg for cost efficiency for this self pay pt     Action/Plan:   ED CM received call from Jomarie LongsJoseph, pharmacist CM reviewed pt's AVS in EPIC Cm spoke with EDP C Horton to obtain change in Rx to 250 mg 2 tabs by mouth 4 times a day 80 tabs total   Action/Plan Detail:   Anticipated DC Date:  08/01/2013     Status Recommendation to Physician:   Result of Recommendation:    Other ED Services  Consult Working Plan    DC Planning Services  Other  Medication Assistance    Choice offered to / List presented to:            Status of service:  Completed, signed off  ED Comments:   ED Comments Detail:

## 2013-08-01 NOTE — ED Provider Notes (Signed)
CSN: [id number]     Arrival date & time 08/01/13  0945 History   None   This chart was scribed for Junius Finner PA-C, a non-physician practitioner working with No att. providers found by Lewanda Rife, ED Scribe. This patient was seen in room TR07C/TR07C and the patient's care was started at 3:44 PM     No chief complaint on file.  (Consider location/radiation/quality/duration/timing/severity/associated sxs/prior Treatment) The history is provided by the patient. No language interpreter was used.   HPI Comments: Brittany [name] is a 33 y.o. female who presents to the Emergency Department complaining of constant moderate facial swelling onset chronic, but worse this morning. Describes pain as throbbing and sharp. Reports associated right sided otalgia, right upper and lower dental pain, and sore throat. Reports pain is exacerbated when swallowing. Denies any alleviating factors. Reports taking ibuprofen and Tylenol with no relief of symptoms. Denies associated fever, difficulty breathing, and dysphagia. Denies using tobacco products.   Past Medical History  Diagnosis Date  . No pertinent past medical history   . Headache(784.0)   . Hypertension   . Pregnancy induced hypertension    Past Surgical History  Procedure Laterality Date  . No past surgeries    . Multiple abortions    . Leep     Family History  Problem Relation Age of Onset  . Asthma Mother   . Hypertension Mother   . Asthma Maternal Grandmother    History  Substance Use Topics  . Smoking status: Never Smoker   . Smokeless tobacco: Never Used  . Alcohol Use: No   OB History   Grav Para Term Preterm Abortions TAB SAB Ect Mult Living   7 2 1 1 5 1 4  0 0 2     Review of Systems  Constitutional: Negative for fever.  HENT: Positive for dental problem and facial swelling.   Psychiatric/Behavioral: Negative for confusion.    Allergies  Review of patient's allergies indicates no known allergies.  Home Medications    Current Outpatient Rx  Name  Route  Sig  Dispense  Refill  . etonogestrel (IMPLANON) 68 MG IMPL implant   Subcutaneous   Inject 1 each into the skin once.         Marland Kitchen ibuprofen (ADVIL,MOTRIN) 200 MG tablet   Oral   Take 400-600 mg by mouth every 6 (six) hours as needed for moderate pain.         Marland Kitchen HYDROcodone-acetaminophen (NORCO/VICODIN) 5-325 MG per tablet   Oral   Take 2 tablets by mouth every 4 (four) hours as needed.   6 tablet   0   . penicillin v potassium (VEETID) 500 MG tablet   Oral   Take 1 tablet (500 mg total) by mouth 4 (four) times daily.   40 tablet   0    BP 167/103  Pulse 82  Temp(Src) 97.8 F (36.6 C) (Oral)  Resp 20  Ht 5\' 6"  (1.676 m)  Wt 174 lb (78.926 kg)  BMI 28.10 kg/m2  SpO2 99% Physical Exam  Nursing note and vitals reviewed. Constitutional: She is oriented to person, place, and time. She appears well-developed and well-nourished.  HENT:  Head: Normocephalic and atraumatic.  Mouth/Throat: Uvula is midline and oropharynx is clear and moist. Abnormal dentition. Dental caries present. No dental abscesses.    No signs of peritonsillar or tonsillar abscess. No signs of gingival abscess. Oropharynx is clear and without exudates. Uvula is midline.  Airway is intact. No signs of Ludwig's  angina with palpation of the oral and sublingual mucosa.  Multiple missing teeth. Dental decay right upper and lower third molar .     Eyes: EOM are normal.  Neck: Normal range of motion. Neck supple.  Cardiovascular: Normal rate.   Pulmonary/Chest: Effort normal.  Musculoskeletal: Normal range of motion.  Lymphadenopathy:    She has no cervical adenopathy.  Neurological: She is alert and oriented to person, place, and time.  Skin: Skin is warm and dry.  Psychiatric: She has a normal mood and affect. Her behavior is normal.    ED Course  Procedures  COORDINATION OF CARE:  Nursing notes reviewed. Vital signs reviewed. Initial pt interview and  examination performed.   10:10 AM-Discussed treatment plan with pt at bedside. Pt agrees with plan.   Treatment plan initiated: Medications  HYDROcodone-acetaminophen (NORCO/VICODIN) 5-325 MG per tablet 2 tablet (2 tablets Oral Given 08/01/13 1042)     Initial diagnostic testing ordered.    Labs Review Labs Reviewed - No data to display Imaging Review No results found.  EKG Interpretation   None       MDM   1. Pain due to dental caries   2. Dental decay   3. Elevated blood pressure    Pt c/o dental pain.  On exam, diffuse dental decay.  Rx: vicodin and PCN.  Discussed BP with pt, advised her to f/u with PCP for closer management of HTN.  F/u with Dr. Lucky CowboyKnox, DDS, within 24-48 hours.  Return precautions provided. Pt verbalized understanding and agreement with tx plan.  I personally performed the services described in this documentation, which was scribed in my presence. The recorded information has been reviewed and is accurate.    Junius Finnerrin O'Malley, PA-C 08/01/13 1546

## 2013-08-01 NOTE — ED Notes (Signed)
C/o left facial swelling and dental pain. Onset last pm. States now has pain in right ear and throat.

## 2013-08-01 NOTE — Discharge Instructions (Signed)
Dental Extraction A dental extraction procedure refers to a routine tooth extraction performed by your dentist. The procedure depends on where and how the tooth is positioned. The procedure can be very quick, sometimes lasting only seconds. Reasons for dental extraction include:  Tooth decay.  Infections (abcesses).  The need to make room for other teeth.  Gum disease s where the supporting bone has been destroyed.  Fractures of the tooth leaving it unrestorable.  Extra teeth (supernumerary) or grossly malformed teeth.  Baby teeththat have not fallen out in time and have not permitted the the permanent teeth to erupt properly.  In preparation for braces where there is not enough room to align the teeth properly.  Not enough room for wisdom teeth (particularly those that are impacted).  Prior to receiving radiation to the head and neck,teeth in the field of radiation may need to be extracted. LET YOUR CAREGIVER KNOW ABOUT:  Any allergies.  All medicines you are taking:  Including herbs, eye drops, over-the-counter medications, and creams.  Blood thinners (anticoagulants), aspirin or other drugs that may affect blood clotting.  Use of steroids (through mouth or as creams).  Previous problems with anesthetics, including local anesthetics.  History of bleeding or blood problems.  Previous surgery.  Possibility of pregnancy if this applies.  Smoking history.  Any health problems. RISKS AND COMPLICATIONS As with any procedure, complications may occur, but they can usually be managed by your caregiver. General surgical complications may include:  Reaction to anesthesia.  Damage to surrounding teeth, nerves, tissues, or structures.  Infection.  Bleeding. With appropriate treatment and care after surgery, the following complications are very uncommon:  Dry socket (blood clot does not form or stay in place over empty socket). This can delay healing.  Incomplete  extraction of roots.  Jawbone injury, pain, or weakness. BEFORE THE PROCEDURE  Your dental care provider will:  Take a medical and dental history.  Take an X-ray to evaluate the circumstances and how to best extract the tooth.  Do an oral exam.  Depending on the situation, antibiotics may be given before or after the extraction, or before and after.  Your caregivers may review the procedure, the local anaesthesia and/or sedation being used, and what to expect after the procedure with you.  If needed, your dentist may give you a form of sedation, either by medicine you swallow, gas, or intravenously (IV). This will help to relieve anxiety. Complicated extractions may require the use of general anaesthesia. It is important to follow your caregiver's instructions prior to your procedure to avoid complications. Steps before your procedure may include:  Alert your caregiver if you feel ill (sore throat, fever, upset stomach, etc.) in the days leading up to your procedure.  Stop taking certain medications for several days prior to your procedure such as blood thinners.  Take certain medications, such as antibiotics.  Avoid eating and drinking for several hours before the procedure. This will help you to avoid complications from the sedation or anaesthesia.  Sign a patient consent form.  Have a friend or family member drive you to the dentist and drive you home after the procedure.  Wear comfortable, loose clothing. Limit makeup and jewelry.  Quit smoking. If you are a smoker, this will raise the chances of a healing problem after your procedure. If you are thinking about quitting, talk to your surgeon about how long before the operation you should stop smoking. You may also get help from your primary caregiver. PROCEDURE  Dental extraction is typically done as an outpatient procedure. IV sedation, local anesthesia, or both may be used. It will keep you comfortable and free of pain  during the procedure.  There are 2 types of extractions:  Simple extraction involves a tooth that is visible in the mouth and above the gum line. After local anesthetic is given by injection, and the area is numbed, the dentist will loosen the tooth with a special instrument (elevator). Then another instrument (forceps) will be used to grasp the tooth and remove it from its socket. During the procedure you will feel some pressure, but you should not feel pain. If you do feel pain, tell your dentist. The open socket will be cleaned. Dressings (gauze) will be placed in the socket to reduce bleeding.  Surgical extractions are used if the tooth has not come into the mouth or the tooth is broken off below the gum line. The dentist will make a cut (incision) in the gum and may have to remove some of the bone around the tooth to aid in the removal of the tooth. After removal, stitches (sutures) may be required to close area to help in healing and control bleeding. For some surgical extractions, you may need a general anesthetic or IV sedation (through the vein). After both types of extractions, you may be given pain medication or other drugs to help healing. Other post operative instructions will be given by your dental caregiver. AFTER THE PROCEDURE  You will have gauze in your mouth where the tooth was removed. Gentle pressure on the gauze for up to 1 hour will help to control bleeding.  A blood clot will begin to form over the open socket. This is normal. Do not touch the area or rinse it.  Your pain will be controlled with medication and self-care.  You will be given detailed instructions for care after surgery. PROGNOSIS While some discomfort is normal after tooth extraction, most patients recover fully in just a few days. SEEK IMMEDIATE DENTAL CARE  You have uncontrolled bleeding, marked swelling, or severe pain.  You develop a fever, difficulty swallowing, or other severe symptoms.  You have  questions or concerns. Document Released: 07/04/2005 Document Revised: 09/26/2011 Document Reviewed: 10/08/2010 [facility] Patient Information 2014 [place], [place].  Dental Pain A tooth ache may be caused by cavities (tooth decay). Cavities expose the nerve of the tooth to air and hot or cold temperatures. It may come from an infection or abscess (also called a boil or furuncle) around your tooth. It is also often caused by dental caries (tooth decay). This causes the pain you are having. DIAGNOSIS  Your caregiver can diagnose this problem by exam. TREATMENT   If caused by an infection, it may be treated with medications which kill germs (antibiotics) and pain medications as prescribed by your caregiver. Take medications as directed.  Only take over-the-counter or prescription medicines for pain, discomfort, or fever as directed by your caregiver.  Whether the tooth ache today is caused by infection or dental disease, you should see your dentist as soon as possible for further care. SEEK MEDICAL CARE IF: The exam and treatment you received today has been provided on an emergency basis only. This is not a substitute for complete medical or dental care. If your problem worsens or new problems (symptoms) appear, and you are unable to meet with your dentist, call or return to this location. SEEK IMMEDIATE MEDICAL CARE IF:   You have a fever.  You develop  redness and swelling of your face, jaw, or neck.  You are unable to open your mouth.  You have severe pain uncontrolled by pain medicine. MAKE SURE YOU:   Understand these instructions.  Will watch your condition.  Will get help right away if you are not doing well or get worse. Document Released: 07/04/2005 Document Revised: 09/26/2011 Document Reviewed: 02/20/2008 [facility] Patient Information 2014 [place], [place].  Dental Caries Dental caries is tooth decay. This decay can cause a hole in teeth (cavity) that can get bigger and deeper  over time. HOME CARE  Brush and floss your teeth. Do this at least two times a day.  Use a fluoride toothpaste.  Use a mouth rinse if told by your dentist or doctor.  Eat less sugary and starchy foods. Drink less sugary drinks.  Avoid snacking often on sugary and starchy foods. Avoid sipping often on sugary drinks.  Keep regular checkups and cleanings with your dentist.  Use fluoride supplements if told by your dentist or doctor.  Allow fluoride to be applied to teeth if told by your dentist or doctor. MAKE SURE YOU:  Understand these instructions.  Will watch your condition.  Will get help right away if you are not doing well or get worse. Document Released: 04/12/2008 Document Revised: 03/06/2013 Document Reviewed: 07/06/2012 [facility] Patient Information 2014 [place], [place].

## 2013-08-02 ENCOUNTER — Encounter (HOSPITAL_COMMUNITY): Payer: Self-pay | Admitting: Emergency Medicine

## 2013-08-02 ENCOUNTER — Emergency Department (HOSPITAL_COMMUNITY)
Admission: EM | Admit: 2013-08-02 | Discharge: 2013-08-02 | Disposition: A | Discharge status: Home / Self Care | Payer: Self-pay | Attending: Emergency Medicine | Admitting: Emergency Medicine

## 2013-08-02 DIAGNOSIS — R51 Headache: Secondary | ICD-10-CM | POA: Insufficient documentation

## 2013-08-02 DIAGNOSIS — O139 Gestational [pregnancy-induced] hypertension without significant proteinuria, unspecified trimester: Secondary | ICD-10-CM | POA: Insufficient documentation

## 2013-08-02 DIAGNOSIS — R519 Headache, unspecified: Secondary | ICD-10-CM

## 2013-08-02 LAB — CBC
HCT: 36.1 % (ref 36.0–46.0)
Hemoglobin: 11.8 g/dL — ABNORMAL LOW (ref 12.0–15.0)
MCH: 27.6 pg (ref 26.0–34.0)
MCHC: 32.7 g/dL (ref 30.0–36.0)
MCV: 84.5 fL (ref 78.0–100.0)
PLATELETS: 289 10*3/uL (ref 150–400)
RBC: 4.27 MIL/uL (ref 3.87–5.11)
RDW: 14.3 % (ref 11.5–15.5)
WBC: 9.7 10*3/uL (ref 4.0–10.5)

## 2013-08-02 LAB — POCT I-STAT, CHEM 8
BUN: 13 mg/dL (ref 6–23)
Calcium, Ion: 1.19 mmol/L (ref 1.12–1.23)
Chloride: 102 mEq/L (ref 96–112)
Creatinine, Ser: 0.8 mg/dL (ref 0.50–1.10)
GLUCOSE: 103 mg/dL — AB (ref 70–99)
HCT: 39 % (ref 36.0–46.0)
HEMOGLOBIN: 13.3 g/dL (ref 12.0–15.0)
Potassium: 3.4 mEq/L — ABNORMAL LOW (ref 3.7–5.3)
Sodium: 141 mEq/L (ref 137–147)
TCO2: 24 mmol/L (ref 0–100)

## 2013-08-02 MED ORDER — IBUPROFEN 800 MG PO TABS: 800.0000 mg | ORAL_TABLET | Freq: Three times a day (TID) | ORAL | Status: DC

## 2013-08-02 MED ORDER — DEXAMETHASONE SODIUM PHOSPHATE 4 MG/ML IJ SOLN
10.0000 mg | Freq: Once | INTRAMUSCULAR | Status: AC
  Administered 2013-08-02: 10 mg via INTRAVENOUS
  Filled 2013-08-02: qty 3

## 2013-08-02 MED ORDER — SODIUM CHLORIDE 0.9 % IV BOLUS (SEPSIS)
1000.0000 mL | Freq: Once | INTRAVENOUS | Status: AC
  Administered 2013-08-02: 1000 mL via INTRAVENOUS

## 2013-08-02 MED ORDER — DIPHENHYDRAMINE HCL 50 MG/ML IJ SOLN
25.0000 mg | Freq: Once | INTRAMUSCULAR | Status: AC
  Administered 2013-08-02: 25 mg via INTRAVENOUS
  Filled 2013-08-02: qty 1

## 2013-08-02 MED ORDER — METOCLOPRAMIDE HCL 5 MG/ML IJ SOLN
10.0000 mg | Freq: Once | INTRAMUSCULAR | Status: AC
  Administered 2013-08-02: 10 mg via INTRAVENOUS
  Filled 2013-08-02: qty 2

## 2013-08-02 MED ORDER — MAGNESIUM SULFATE 40 MG/ML IJ SOLN
2.0000 g | Freq: Once | INTRAMUSCULAR | Status: AC
  Administered 2013-08-02: 2 g via INTRAVENOUS
  Filled 2013-08-02: qty 50

## 2013-08-02 MED ORDER — PROMETHAZINE HCL 25 MG PO TABS: 25.0000 mg | ORAL_TABLET | Freq: Four times a day (QID) | ORAL | Status: DC | PRN

## 2013-08-02 NOTE — ED Notes (Signed)
Denies dizziness, and photophobia

## 2013-08-02 NOTE — Discharge Instructions (Signed)
Rest today and be sure to drink plenty of fluids. Call today to schedule followup in the clinic. Take medications as needed. Return here for any worsening condition, fevers or vomiting.  Headaches, Frequently Asked Questions  MIGRAINE HEADACHES  Q: What is migraine? What causes it? How can I treat it?  A: Generally, migraine headaches begin as a dull ache. Then they develop into a constant, throbbing, and pulsating pain. You may experience pain at the temples. You may experience pain at the front or back of one or both sides of the head. The pain is usually accompanied by a combination of:  Nausea.  Vomiting.  Sensitivity to light and noise. Some people (about 15%) experience an aura (see below) before an attack. The cause of migraine is believed to be chemical reactions in the brain. Treatment for migraine may include over-the-counter or prescription medications. It may also include self-help techniques. These include relaxation training and biofeedback.  Q: What is an aura?  A: About 15% of people with migraine get an "aura". This is a sign of neurological symptoms that occur before a migraine headache. You may see wavy or jagged lines, dots, or flashing lights. You might experience tunnel vision or blind spots in one or both eyes. The aura can include visual or auditory hallucinations (something imagined). It may include disruptions in smell (such as strange odors), taste or touch. Other symptoms include:  Numbness.  A "pins and needles" sensation.  Difficulty in recalling or speaking the correct word. These neurological events may last as long as 60 minutes. These symptoms will fade as the headache begins.  Q: What is a trigger?  A: Certain physical or environmental factors can lead to or "trigger" a migraine. These include:  Foods.  Hormonal changes.  Weather.  Stress. It is important to remember that triggers are different for everyone. To help prevent migraine attacks, you need to figure  out which triggers affect you. Keep a headache diary. This is a good way to track triggers. The diary will help you talk to your healthcare professional about your condition.  Q: Does weather affect migraines?  A: Bright sunshine, hot, humid conditions, and drastic changes in barometric pressure may lead to, or "trigger," a migraine attack in some people. But studies have shown that weather does not act as a trigger for everyone with migraines.  Q: What is the link between migraine and hormones?  A: Hormones start and regulate many of your body's functions. Hormones keep your body in balance within a constantly changing environment. The levels of hormones in your body are unbalanced at times. Examples are during menstruation, pregnancy, or menopause. That can lead to a migraine attack. In fact, about three quarters of all women with migraine report that their attacks are related to the menstrual cycle.  Q: Is there an increased risk of stroke for migraine sufferers?  A: The likelihood of a migraine attack causing a stroke is very remote. That is not to say that migraine sufferers cannot have a stroke associated with their migraines. In persons under age 16, the most common associated factor for stroke is migraine headache. But over the course of a person's normal life span, the occurrence of migraine headache may actually be associated with a reduced risk of dying from cerebrovascular disease due to stroke.  Q: What are acute medications for migraine?  A: Acute medications are used to treat the pain of the headache after it has started. Examples over-the-counter medications, NSAIDs, ergots, and  triptans.  Q: What are the triptans?  A: Triptans are the newest class of abortive medications. They are specifically targeted to treat migraine. Triptans are vasoconstrictors. They moderate some chemical reactions in the brain. The triptans work on receptors in your brain. Triptans help to restore the balance of a  neurotransmitter called serotonin. Fluctuations in levels of serotonin are thought to be a main cause of migraine.  Q: Are over-the-counter medications for migraine effective?  A: Over-the-counter, or "OTC," medications may be effective in relieving mild to moderate pain and associated symptoms of migraine. But you should see your caregiver before beginning any treatment regimen for migraine.  Q: What are preventive medications for migraine?  A: Preventive medications for migraine are sometimes referred to as "prophylactic" treatments. They are used to reduce the frequency, severity, and length of migraine attacks. Examples of preventive medications include antiepileptic medications, antidepressants, beta-blockers, calcium channel blockers, and NSAIDs (nonsteroidal anti-inflammatory drugs).  Q: Why are anticonvulsants used to treat migraine?  A: During the past few years, there has been an increased interest in antiepileptic drugs for the prevention of migraine. They are sometimes referred to as "anticonvulsants". Both epilepsy and migraine may be caused by similar reactions in the brain.  Q: Why are antidepressants used to treat migraine?  A: Antidepressants are typically used to treat people with depression. They may reduce migraine frequency by regulating chemical levels, such as serotonin, in the brain.  Q: What alternative therapies are used to treat migraine?  A: The term "alternative therapies" is often used to describe treatments considered outside the scope of conventional Western medicine. Examples of alternative therapy include acupuncture, acupressure, and yoga. Another common alternative treatment is herbal therapy. Some herbs are believed to relieve headache pain. Always discuss alternative therapies with your caregiver before proceeding. Some herbal products contain arsenic and other toxins.  TENSION HEADACHES  Q: What is a tension-type headache? What causes it? How can I treat it?  A:  Tension-type headaches occur randomly. They are often the result of temporary stress, anxiety, fatigue, or anger. Symptoms include soreness in your temples, a tightening band-like sensation around your head (a "vice-like" ache). Symptoms can also include a pulling feeling, pressure sensations, and contracting head and neck muscles. The headache begins in your forehead, temples, or the back of your head and neck. Treatment for tension-type headache may include over-the-counter or prescription medications. Treatment may also include self-help techniques such as relaxation training and biofeedback.  CLUSTER HEADACHES  Q: What is a cluster headache? What causes it? How can I treat it?  A: Cluster headache gets its name because the attacks come in groups. The pain arrives with little, if any, warning. It is usually on one side of the head. A tearing or bloodshot eye and a runny nose on the same side of the headache may also accompany the pain. Cluster headaches are believed to be caused by chemical reactions in the brain. They have been described as the most severe and intense of any headache type. Treatment for cluster headache includes prescription medication and oxygen.  SINUS HEADACHES  Q: What is a sinus headache? What causes it? How can I treat it?  A: When a cavity in the bones of the face and skull (a sinus) becomes inflamed, the inflammation will cause localized pain. This condition is usually the result of an allergic reaction, a tumor, or an infection. If your headache is caused by a sinus blockage, such as an infection, you will probably have  a fever. An x-ray will confirm a sinus blockage. Your caregiver's treatment might include antibiotics for the infection, as well as antihistamines or decongestants.  REBOUND HEADACHES  Q: What is a rebound headache? What causes it? How can I treat it?  A: A pattern of taking acute headache medications too often can lead to a condition known as "rebound  headache." A pattern of taking too much headache medication includes taking it more than 2 days per week or in excessive amounts. That means more than the label or a caregiver advises. With rebound headaches, your medications not only stop relieving pain, they actually begin to cause headaches. Doctors treat rebound headache by tapering the medication that is being overused. Sometimes your caregiver will gradually substitute a different type of treatment or medication. Stopping may be a challenge. Regularly overusing a medication increases the potential for serious side effects. Consult a caregiver if you regularly use headache medications more than 2 days per week or more than the label advises.  ADDITIONAL QUESTIONS AND ANSWERS  Q: What is biofeedback?  A: Biofeedback is a self-help treatment. Biofeedback uses special equipment to monitor your body's involuntary physical responses. Biofeedback monitors:  Breathing.  Pulse.  Heart rate.  Temperature.  Muscle tension.  Brain activity. Biofeedback helps you refine and perfect your relaxation exercises. You learn to control the physical responses that are related to stress. Once the technique has been mastered, you do not need the equipment any more.  Q: Are headaches hereditary?  A: Four out of five (80%) of people that suffer report a family history of migraine. Scientists are not sure if this is genetic or a family predisposition. Despite the uncertainty, a child has a 50% chance of having migraine if one parent suffers. The child has a 75% chance if both parents suffer.  Q: Can children get headaches?  A: By the time they reach high school, most young people have experienced some type of headache. Many safe and effective approaches or medications can prevent a headache from occurring or stop it after it has begun.  Q: What type of doctor should I see to diagnose and treat my headache?  A: Start with your primary caregiver. Discuss his or her  experience and approach to headaches. Discuss methods of classification, diagnosis, and treatment. Your caregiver may decide to recommend you to a headache specialist, depending upon your symptoms or other physical conditions. Having diabetes, allergies, etc., may require a more comprehensive and inclusive approach to your headache. The National Head[employer] Foundation will provide, upon request, a list of [facility] physician members in your state.  Document Released: 09/24/2003 Document Revised: 09/26/2011 Document Reviewed: 03/03/2008  [facility] Patient Information 2014 [place], [place].

## 2013-08-02 NOTE — ED Notes (Signed)
Pt arrived via [facility]. seen earlier today for c/o abcess tooth. States after taking her medications she now has HA. No neuro deficits noted.EMS VS BP 138/78, CBG 90, RR18, HR 100

## 2013-08-02 NOTE — ED Provider Notes (Signed)
CSN: [id number]     Arrival date & time 08/02/13  0140 History   First MD Initiated Contact with Patient 08/02/13 [phone]     Chief Complaint  Patient presents with  . Headache   (Consider location/radiation/quality/duration/timing/severity/associated sxs/prior Treatment) HPI History provided by patient. Headache onset today. Patient unable to sleep tonight. Located bilateral and frontal without radiation. No associated neck pain or neck stiffness. No fevers or chills. No nausea vomiting.  Patient was seen here earlier for dental pain was discharged home with prescription for Vicodin. She took that medication and afterwards developed this headache. She denies history of migraines. Pain is moderate to severe. No sudden onset or thunderclap headache. This is not the worst headache of life. No weakness or numbness. No difficulty with vision, gait or speech.   Past Medical History  Diagnosis Date  . No pertinent past medical history   . Headache(784.0)   . Hypertension   . Pregnancy induced hypertension    Past Surgical History  Procedure Laterality Date  . No past surgeries    . Multiple abortions    . Leep     Family History  Problem Relation Age of Onset  . Asthma Mother   . Hypertension Mother   . Asthma Maternal Grandmother    History  Substance Use Topics  . Smoking status: Never Smoker   . Smokeless tobacco: Never Used  . Alcohol Use: No   OB History   Grav Para Term Preterm Abortions TAB SAB Ect Mult Living   7 2 1 1 5 1 4  0 0 2     Review of Systems  Constitutional: Negative for fever and chills.  Eyes: Negative for visual disturbance.  Respiratory: Negative for shortness of breath.   Cardiovascular: Negative for chest pain.  Gastrointestinal: Negative for vomiting and abdominal pain.  Genitourinary: Negative for dysuria.  Musculoskeletal: Negative for back pain, neck pain and neck stiffness.  Skin: Negative for rash.  Neurological: Positive for headaches. Negative  for seizures, syncope and weakness.  All other systems reviewed and are negative.    Allergies  Review of patient's allergies indicates no known allergies.  Home Medications   Current Outpatient Rx  Name  Route  Sig  Dispense  Refill  . etonogestrel (IMPLANON) 68 MG IMPL implant   Subcutaneous   Inject 1 each into the skin once.         Marland Kitchen. HYDROcodone-acetaminophen (NORCO/VICODIN) 5-325 MG per tablet   Oral   Take 2 tablets by mouth every 4 (four) hours as needed for moderate pain.         Marland Kitchen. ibuprofen (ADVIL,MOTRIN) 200 MG tablet   Oral   Take 400-600 mg by mouth every 6 (six) hours as needed for moderate pain.         Marland Kitchen. penicillin v potassium (VEETID) 500 MG tablet   Oral   Take 1 tablet (500 mg total) by mouth 4 (four) times daily.   40 tablet   0    BP 129/87  Pulse 92  Temp(Src) 98.6 F (37 C) (Oral)  SpO2 97% Physical Exam  Constitutional: She is oriented to person, place, and time. She appears well-developed and well-nourished.  HENT:  Head: Normocephalic and atraumatic.  Mouth/Throat: Oropharynx is clear and moist. No oropharyngeal exudate.  Eyes: Conjunctivae and EOM are normal. Pupils are equal, round, and reactive to light.  Neck: Full passive range of motion without pain. Neck supple. No thyromegaly present.  No meningismus  Cardiovascular: Normal  rate, regular rhythm, S1 normal, S2 normal and intact distal pulses.   Pulmonary/Chest: Effort normal and breath sounds normal.  Abdominal: Soft. Bowel sounds are normal. There is no tenderness. There is no CVA tenderness.  Musculoskeletal: Normal range of motion. She exhibits no edema and no tenderness.  Neurological: She is alert and oriented to person, place, and time. She has normal strength and normal reflexes. No cranial nerve deficit or sensory deficit. She displays a negative Romberg sign. GCS eye subscore is 4. GCS verbal subscore is 5. GCS motor subscore is 6.  Normal Gait  Skin: Skin is warm and  dry. No rash noted. No cyanosis. Nails show no clubbing.  Psychiatric: She has a normal mood and affect. Her speech is normal and behavior is normal.    ED Course  Procedures (including critical care time) Labs Review Labs Reviewed  CBC - Abnormal; Notable for the following:    Hemoglobin 11.8 (*)    All other components within normal limits  POCT I-STAT, CHEM 8 - Abnormal; Notable for the following:    Potassium 3.4 (*)    Glucose, Bld 103 (*)    All other components within normal limits    IV fluids. IV Decadron. IV Benadryl. IV Reglan. On recheck still having some headache. IV magnesium provided  7:01 AM on recheck is feeling much better and would like to be discharged home. No change on normal neurological exam. Plan discharge home with prescription for Motrin to take as needed and outpatient referral provided. Headache precautions verbalized is understood. MDM  Diagnosis: Headache  Improved with IV fluids and headache cocktail as above. Serial evaluations. No indication for advanced imaging of the brain at this time.  Sunnie Nielsen, MD 08/02/13 [phone]

## 2014-02-25 ENCOUNTER — Encounter (HOSPITAL_COMMUNITY): Payer: Self-pay | Admitting: Emergency Medicine

## 2014-02-25 ENCOUNTER — Emergency Department (HOSPITAL_COMMUNITY)
Admission: EM | Admit: 2014-02-25 | Discharge: 2014-02-25 | Disposition: A | Discharge status: Home / Self Care | Payer: Self-pay | Attending: Emergency Medicine | Admitting: Emergency Medicine

## 2014-02-25 DIAGNOSIS — K047 Periapical abscess without sinus: Secondary | ICD-10-CM

## 2014-02-25 DIAGNOSIS — K029 Dental caries, unspecified: Secondary | ICD-10-CM

## 2014-02-25 DIAGNOSIS — R51 Headache: Secondary | ICD-10-CM | POA: Insufficient documentation

## 2014-02-25 DIAGNOSIS — K089 Disorder of teeth and supporting structures, unspecified: Secondary | ICD-10-CM | POA: Insufficient documentation

## 2014-02-25 DIAGNOSIS — R519 Headache, unspecified: Secondary | ICD-10-CM

## 2014-02-25 DIAGNOSIS — R221 Localized swelling, mass and lump, neck: Secondary | ICD-10-CM

## 2014-02-25 DIAGNOSIS — R220 Localized swelling, mass and lump, head: Secondary | ICD-10-CM

## 2014-02-25 DIAGNOSIS — I10 Essential (primary) hypertension: Secondary | ICD-10-CM | POA: Insufficient documentation

## 2014-02-25 MED ORDER — AMOXICILLIN-POT CLAVULANATE 875-125 MG PO TABS: 1.0000 | ORAL_TABLET | Freq: Two times a day (BID) | ORAL | Status: DC

## 2014-02-25 MED ORDER — HYDROCODONE-ACETAMINOPHEN 5-325 MG PO TABS: 1.0000 | ORAL_TABLET | Freq: Four times a day (QID) | ORAL | Status: DC | PRN

## 2014-02-25 NOTE — ED Provider Notes (Signed)
Medical screening examination/treatment/procedure(s) were performed by non-physician practitioner and as supervising physician I was immediately available for consultation/collaboration.   Seeley Southgate, MD 02/25/14 0703 

## 2014-02-25 NOTE — ED Provider Notes (Signed)
CSN: [id number]     Arrival date & time 02/25/14  0537 History   First MD Initiated Contact with Patient 02/25/14 0606     Chief Complaint  Patient presents with  . Dental Pain  . Facial Swelling     (Consider location/radiation/quality/duration/timing/severity/associated sxs/prior Treatment) HPI Comments: Brittany [name] is a 33 y.o. Female with a PMHx of HA, HTN, and dental caries with past abscesses, presenting to the ED today with sudden onset R upper 3rd molar pain beginning 2 days ago, which is throbbing, constant, radiating to her R ear, unrelieved with ice and ASA, and worsened with eating, chewing, opening her mouth, and touching the tooth. States that her face has become swollen on that side beginning today. Denies any fevers, chills, CP, SOB, rhinorrhea, sore throat, ear drainage, sinus congestion, cough, abd pain, N/V/D/C, rashes, numbness, paresthesias, myalgias, or arthralgias. States she has not seen a dentist for this issue.    Patient is a 33 y.o. female presenting with tooth pain. The history is provided by the patient. No language interpreter was used.  Dental Pain Location:  Upper Upper teeth location:  1/RU 3rd molar Quality:  Throbbing Severity:  Moderate Onset quality:  Sudden Duration:  2 days Timing:  Constant Progression:  Unchanged Chronicity:  Recurrent Context: dental caries   Prior workup: seen in the ED several times, given antibiotics and pain medication. Relieved by:  Nothing Worsened by:  Cold food/drink, hot food/drink, touching, pressure and jaw movement Ineffective treatments:  NSAIDs and ice (ASA) Associated symptoms: facial pain, facial swelling, gum swelling and trismus   Associated symptoms: no congestion, no difficulty swallowing, no drooling, no fever, no headaches, no neck pain, no neck swelling, no oral bleeding and no oral lesions   Risk factors: lack of dental care     Past Medical History  Diagnosis Date  . No pertinent past medical  history   . Headache(784.0)   . Hypertension   . Pregnancy induced hypertension    Past Surgical History  Procedure Laterality Date  . No past surgeries    . Multiple abortions    . Leep     Family History  Problem Relation Age of Onset  . Asthma Mother   . Hypertension Mother   . Asthma Maternal Grandmother    History  Substance Use Topics  . Smoking status: Never Smoker   . Smokeless tobacco: Never Used  . Alcohol Use: No   OB History   Grav Para Term Preterm Abortions TAB SAB Ect Mult Living   7 2 1 1 5 1 4  0 0 2     Review of Systems  Constitutional: Negative for fever and chills.  HENT: Positive for dental problem, ear pain (R) and facial swelling. Negative for congestion, drooling, hearing loss, mouth sores, postnasal drip, rhinorrhea, sinus pressure, sore throat and trouble swallowing.   Eyes: Negative for pain.  Respiratory: Negative for cough and shortness of breath.   Cardiovascular: Negative for chest pain.  Gastrointestinal: Negative for nausea, vomiting, abdominal pain, diarrhea and constipation.  Musculoskeletal: Negative for arthralgias, myalgias and neck pain.  Skin: Negative for color change.  Neurological: Negative for numbness and headaches.  10 Systems reviewed and are negative for acute change except as noted in the HPI.     Allergies  Review of patient's allergies indicates no known allergies.  Home Medications   Prior to Admission medications   Medication Sig Start Date End Date Taking? Authorizing Provider  aspirin 325 MG  tablet Take 650 mg by mouth every 6 (six) hours as needed (tooth pain).   Yes Historical Provider, MD  amoxicillin-clavulanate (AUGMENTIN) 875-125 MG per tablet Take 1 tablet by mouth 2 (two) times daily. One po bid x 7 days 02/25/14   Donnita Falls Camprubi-Soms, PA-C  HYDROcodone-acetaminophen (NORCO) 5-325 MG per tablet Take 1-2 tablets by mouth every 6 (six) hours as needed for severe pain. 02/25/14   Tamara Monteith Strupp  Camprubi-Soms, PA-C   BP 145/95  Pulse 92  Temp(Src) 98.4 F (36.9 C) (Oral)  Resp 17  SpO2 100% Physical Exam  Nursing note and vitals reviewed. Constitutional: She is oriented to person, place, and time. Vital signs are normal. She appears well-developed and well-nourished. No distress.  VSS, afebrile, NAD  HENT:  Head: Normocephalic and atraumatic.  Right Ear: Hearing, tympanic membrane, external ear and ear canal normal.  Left Ear: Hearing, tympanic membrane, external ear and ear canal normal.  Nose: Nose normal.  Mouth/Throat: Uvula is midline, oropharynx is clear and moist and mucous membranes are normal. There is trismus in the jaw. Dental abscesses and dental caries present. No uvula swelling.  [place]/AT, bilateral TMs clear with good visualization of bony landmarks. Nose clear. Poor oral dentitia throughout, missing multiple teeth, with RU3rd molar (tooth #1) with caries and abscessed, and associated dental swelling. Face visibly swollen. Mild trismus. Uvula midline, no tongue or mouth subfloor swelling, no neck swelling. No tonsillar exudate or erythema, no peritonsillar abscess. MMM  Eyes: Conjunctivae and EOM are normal. Right eye exhibits no discharge. Left eye exhibits no discharge.  Neck: Normal range of motion. Neck supple. No spinous process tenderness and no muscular tenderness present. No rigidity. Normal range of motion present.  FROM intact without tenderness to palpation  Cardiovascular: Normal rate.   Pulmonary/Chest: Effort normal. No respiratory distress.  Abdominal: Normal appearance. She exhibits no distension.  Musculoskeletal: Normal range of motion.  Lymphadenopathy:       Head (right side): Submandibular adenopathy present.       Head (left side): No submandibular adenopathy present.  R submandibular LAD without tenderness. No other LAD  Neurological: She is alert and oriented to person, place, and time.  Skin: Skin is warm, dry and intact. No rash noted.   No erythema or rashes. Facial swelling over upper R jaw  Psychiatric: She has a normal mood and affect.    ED Course  Procedures (including critical care time) Labs Review Labs Reviewed - No data to display  Imaging Review No results found.   EKG Interpretation None      MDM   Final diagnoses:  Dental caries  Dental abscess  Facial pain  Facial swelling    Dental pain associated with dental infection and possible dental abscess with patient afebrile, non toxic appearing and swallowing secretions well. I gave patient referral to dentist and stressed the importance of dental follow up for ultimate management of dental pain. Rx for augmentin and pain control.  I have also discussed reasons to return immediately to the ER.  Patient expresses understanding and agrees with plan.  BP 145/95  Pulse 92  Temp(Src) 98.4 F (36.9 C) (Oral)  Resp 17  SpO2 100%  Meds ordered this encounter  Medications  . HYDROcodone-acetaminophen (NORCO) 5-325 MG per tablet    Sig: Take 1-2 tablets by mouth every 6 (six) hours as needed for severe pain.    Dispense:  10 tablet    Refill:  0    Order Specific Question:  Supervising Provider    Answer:  Eber HongMILLER, BRIAN D [[id number]]  . amoxicillin-clavulanate (AUGMENTIN) 875-125 MG per tablet    Sig: Take 1 tablet by mouth 2 (two) times daily. One po bid x 7 days    Dispense:  14 tablet    Refill:  0    Order Specific Question:  Supervising Provider    Answer:  Vida RollerMILLER, BRIAN D [address] Shalaina Guardiola[[id number]]      Denys Salinger Strupp Camprubi-Soms, PA-C 02/25/14 [phone]

## 2014-02-25 NOTE — Discharge Instructions (Signed)
Apply warm compresses to jaw throughout the day. Take antibiotic until finished. Take Norco as directed, as needed for pain but do not drive or operate machinery with pain medication use. Followup with a dentist is very important for ongoing evaluation and management of recurrent dental pain. Return to emergency department for emergent changing or worsening symptoms.   Abscessed Tooth An abscessed tooth is an infection around your tooth. It may be caused by holes or damage to the tooth (cavity) or a dental disease. An abscessed tooth causes mild to very bad pain in and around the tooth. See your dentist right away if you have tooth or gum pain. HOME CARE  Take your medicine as told. Finish it even if you start to feel better.  Do not drive after taking pain medicine.  Rinse your mouth (gargle) often with salt water ( teaspoon salt in 8 ounces of warm water).  Do not apply heat to the outside of your face. GET HELP RIGHT AWAY IF:   You have a temperature by mouth above 102 F (38.9 C), not controlled by medicine.  You have chills and a very bad headache.  You have problems breathing or swallowing.  Your mouth will not open.  You develop puffiness (swelling) on the neck or around the eye.  Your pain is not helped by medicine.  Your pain is getting worse instead of better. MAKE SURE YOU:   Understand these instructions.  Will watch your condition.  Will get help right away if you are not doing well or get worse. Document Released: 12/21/2007 Document Revised: 09/26/2011 Document Reviewed: 10/12/2010 [facility] Patient Information 2015 [place], [place]. This information is not intended to replace advice given to you by your health care provider. Make sure you discuss any questions you have with your health care provider.  Emergency Department Resource Guide 1) Find a Doctor and Pay Out of Pocket Although you won't have to find out who is covered by your insurance plan, it is a  good idea to ask around and get recommendations. You will then need to call the office and see if the doctor you have chosen will accept you as a new patient and what types of options they offer for patients who are self-pay. Some doctors offer discounts or will set up payment plans for their patients who do not have insurance, but you will need to ask so you aren't surprised when you get to your appointment.  2) Contact Your Local Health Department Not all health departments have doctors that can see patients for sick visits, but many do, so it is worth a call to see if yours does. If you don't know where your local health department is, you can check in your phone book. The CDC also has a tool to help you locate your state's health department, and many state websites also have listings of all of their local health departments.  3) Find a Walk-in Clinic If your illness is not likely to be very severe or complicated, you may want to try a walk in clinic. These are popping up all over the country in pharmacies, drugstores, and shopping centers. They're usually staffed by nurse practitioners or physician assistants that have been trained to treat common illnesses and complaints. They're usually fairly quick and inexpensive. However, if you have serious medical issues or chronic medical problems, these are probably not your best option.  No Primary Care Doctor: - Call Health Connect at  [phone] - they can help you  locate a primary care doctor that  accepts your insurance, provides certain services, etc. - Physician Referral Service- [phone]  Chronic Pain Problems: Organization         Address  Phone   Notes  [facility]  [phone] Patients need to be referred by their primary care doctor.   Medication Assistance: Organization         Address  Phone   Notes  [facility] Medication [facility] [address] [address] [place], [place] [address] [phone] --Must be a resident of [facility] -- Must have NO insurance coverage whatsoever (no Medicaid/ Medicare, etc.) -- The pt. MUST have a primary care doctor that directs their care regularly and follows them in the community   [facility]  [phone]   [employer]  [phone]    Agencies that provide inexpensive medical care: Organization         Address  Phone   Notes  [facility]  [phone]   [facility]    [phone]   [facility] [address] [place], [place] [address] [phone]   [facility] [address], [place] [phone]   [facility]anned Parenthood    [phone]   [facility]    [phone]   [facility]  [address], [place] Phone:  [phone], Fax:  [phone] Hours of Operation:  9 am - 6 pm, M-F.  Also accepts Medicaid/Medicare and self-pay.  [facility]  [address], [place] Phone: [phone], Fax: [phone]. Hours of Operation:  8:30 am - 5:30 pm, M-F.  Also accepts Medicaid and self-pay.  [facility] [place] [address], [place] Phone: [phone]   [facility] [address] Natasha Bence [place], [place] [phone], Ext. [phone] Mondays & Thursdays: 7-9 AM.  First 15 patients are seen on a first come, first serve basis.    Medicaid-accepting [facility] Providers:  Organization         Address  Phone   Notes  [facility] [address], [place] [phone] Also accepts self-pay patients.  [facility] [address] Laurell Josephs [place], [place]  [phone]   [facility] [address], [place] [phone]   [facility] [address] [place] [phone]   Renaye Rakers [address], [place]   [phone] Only accepts [place] patients after they have their name applied to their card.   Self-Pay (no insurance) in [facility]:  Organization         Address  Phone   Notes  Sickle Cell Patients, [facility] [address] [place], [place] [phone]   [facility] [address] [place], [place] [phone]   [facility] [place]  [address], [place] [phone]   [facility]/Dr. Osei-Bonsu  [address], [place] or [address], [place] [phone] Phone number for both [place] and [place] locations is the same.  [facility] [address] [place] [phone]   [facility] [address] [place] or [address] [phone])  [phone]   [facility] [address] [place] [phone], phone; [phone], fax Sees patients 1st and 3rd Saturday of every month.  Must not qualify for public or private insurance (i.e. Medicaid, Medicare, [place] Health Choice, Veterans' Benefits)  Household income should be no more than 200% of the poverty level The clinic cannot treat you if you are pregnant or think you are pregnant  Sexually transmitted diseases are not treated at the clinic.    Dental Care: Organization         Address  Phone  Notes  [facility] Department of [facility] [address] [place], [place] [phone] Accepts children up to age 74 who are enrolled in [place] or [place] Health Choice; pregnant women with a Medicaid card; and children who have applied for Medicaid or [place] Health Choice, but were declined, whose parents can pay a reduced fee at time of service.  [facility] Department of [facility]  [address], [place] [phone] Accepts children up to age 53 who are enrolled in [place] or [place] Health  Choice; pregnant women with a Medicaid card; and children who have applied for Medicaid or  Health Choice, but were declined, whose parents can pay a reduced fee at time of service.  [facility]  [address] [place], [place] [phone] Patients are seen by appointment only. Walk-ins are not accepted. [facility] will see patients 67 years of age and older. Monday - Tuesday (8am-5pm) Most Wednesdays (8:30-5pm) $30 per visit, cash only  [facility]  [address], [place] [facility] [phone] Patients are seen by appointment only. Walk-ins are not accepted. [facility] will see patients 28 years of age and older. One Wednesday Evening (Monthly: Volunteer Based).  $30 per visit, cash only  [employer]  [phone] for adults; Children under age 39, call Graduate Pediatric Dentistry at [phone]. Children aged 21-14, please call [phone] to request a pediatric application.  Dental services are provided in all areas of dental care including fillings, crowns and bridges, complete and partial dentures, implants, gum treatment, root canals, and extractions. Preventive care is also provided. Treatment is provided to both adults and children. Patients are selected via a lottery and there is often a waiting list.   [facility] [address] [place]  [phone] [email or web address]   [facility] [address] [place], [place] [phone], Ext. [phone] Second and Fourth Thursday of each month, opens at 6:30 AM; Clinic ends at 9 AM.  Patients are seen on a first-come first-served basis, and a limited number are seen during each clinic.   [facility]  [address] [place], [place] [phone]   Eligibility Requirements You must have lived in [place], [place], or [place] counties for at least the last three months.   You cannot be eligible for state or  federal sponsored [place], including [employer], [place], or [employer].   You generally cannot be eligible for healthcare insurance through your employer.    How to apply: Eligibility screenings are held every Tuesday and Wednesday afternoon from 1:00 pm until 4:00 pm. You do not need an appointment for the interview!  [facility] [address] [place], [place] [phone]   [facility] Health Department  [phone]   [facility] Health Department  [phone]  [facility]  [phone]

## 2014-02-25 NOTE — ED Notes (Signed)
Pt A&OX4, ambulatory at d/c with steady gait, NAD 

## 2014-02-25 NOTE — ED Notes (Signed)
Pt c/o right upper dental pain with facial swelling and right ear pain. Pt presents with right facial swelling, ice pack given to patient.

## 2014-05-19 ENCOUNTER — Encounter (HOSPITAL_COMMUNITY): Payer: Self-pay | Admitting: Emergency Medicine

## 2014-10-24 ENCOUNTER — Inpatient Hospital Stay (HOSPITAL_COMMUNITY)
Admission: AD | Admit: 2014-10-24 | Discharge: 2014-10-24 | Disposition: A | Discharge status: Home / Self Care | Payer: Self-pay | Source: Ambulatory Visit | Attending: Family Medicine | Admitting: Family Medicine

## 2014-10-24 ENCOUNTER — Encounter (HOSPITAL_COMMUNITY): Payer: Self-pay | Admitting: *Deleted

## 2014-10-24 DIAGNOSIS — L02215 Cutaneous abscess of perineum: Secondary | ICD-10-CM | POA: Insufficient documentation

## 2014-10-24 DIAGNOSIS — L0291 Cutaneous abscess, unspecified: Secondary | ICD-10-CM

## 2014-10-24 DIAGNOSIS — L0390 Cellulitis, unspecified: Secondary | ICD-10-CM

## 2014-10-24 MED ORDER — SULFAMETHOXAZOLE-TRIMETHOPRIM 800-160 MG PO TABS: 1.0000 | ORAL_TABLET | Freq: Two times a day (BID) | ORAL | Status: AC

## 2014-10-24 MED ORDER — OXYCODONE-ACETAMINOPHEN 5-325 MG PO TABS: 1.0000 | ORAL_TABLET | Freq: Four times a day (QID) | ORAL | Status: DC | PRN

## 2014-10-24 NOTE — MAU Note (Signed)
Knot on the left side of vagina, first noted like 5 days.  Some pain.  Not really getting bigger

## 2014-10-24 NOTE — Discharge Instructions (Signed)
Abscess °An abscess (boil or furuncle) is an infected area on or under the skin. This area is filled with yellowish-white fluid (pus) and other material (debris). °HOME CARE  °· Only take medicines as told by your doctor. °· If you were given antibiotic medicine, take it as directed. Finish the medicine even if you start to feel better. °· If gauze is used, follow your doctor's directions for changing the gauze. °· To avoid spreading the infection: °· Keep your abscess covered with a bandage. °· Wash your hands well. °· Do not share personal care items, towels, or whirlpools with others. °· Avoid skin contact with others. °· Keep your skin and clothes clean around the abscess. °· Keep all doctor visits as told. °GET HELP RIGHT AWAY IF:  °· You have more pain, puffiness (swelling), or redness in the wound site. °· You have more fluid or blood coming from the wound site. °· You have muscle aches, chills, or you feel sick. °· You have a fever. °MAKE SURE YOU:  °· Understand these instructions. °· Will watch your condition. °· Will get help right away if you are not doing well or get worse. °Document Released: 12/21/2007 Document Revised: 01/03/2012 Document Reviewed: 09/16/2011 °Exit[employer]® Patient Information ©2015 [employer]. This information is not intended to replace advice given to you by your health care provider. Make sure you discuss any questions you have with your health care provider. ° °Cellulitis °Cellulitis is an infection of the skin and the tissue under the skin. The infected area is usually red and tender. This happens most often in the arms and lower legs. °HOME CARE  °· Take your antibiotic medicine as told. Finish the medicine even if you start to feel better. °· Keep the infected arm or leg raised (elevated). °· Put a warm cloth on the area up to 4 times per day. °· Only take medicines as told by your doctor. °· Keep all doctor visits as told. °GET HELP IF: °· You see red streaks on the skin  coming from the infected area. °· Your red area gets bigger or turns a dark color. °· Your bone or joint under the infected area is painful after the skin heals. °· Your infection comes back in the same area or different area. °· You have a puffy (swollen) bump in the infected area. °· You have new symptoms. °· You have a fever. °GET HELP RIGHT AWAY IF:  °· You feel very sleepy. °· You throw up (vomit) or have watery poop (diarrhea). °· You feel sick and have muscle aches and pains. °MAKE SURE YOU:  °· Understand these instructions. °· Will watch your condition. °· Will get help right away if you are not doing well or get worse. °Document Released: 12/21/2007 Document Revised: 11/18/2013 Document Reviewed: 09/19/2011 °Exit[employer]® Patient Information ©2015 [employer]. This information is not intended to replace advice given to you by your health care provider. Make sure you discuss any questions you have with your health care provider. ° °

## 2014-10-24 NOTE — MAU Provider Note (Signed)
  History     CSN: [id number]  Arrival date and time: 10/24/14 1640   First Provider Initiated Contact with Patient 10/24/14 1723      Chief Complaint  Patient presents with  . Mass   HPI  Brittany [name] is a 34 y.o. [phone] who presents to MAU today with complaint of a "lump on her vagina." The patient states that she noted the area 5 days ago when it became painful. She denies any increase in size since onset. She denies drainage, vaginal discharge, bleeding or fever. She has taken Ibuprofen with minimal relief. She states last dose was yesterday at [id number].   OB History    Gravida Para Term Preterm AB TAB SAB Ectopic Multiple Living   7 2 1 1 5 1 4  0 0 2      Past Medical History  Diagnosis Date  . No pertinent past medical history   . Headache(784.0)   . Hypertension   . Pregnancy induced hypertension     Past Surgical History  Procedure Laterality Date  . Multiple abortions    . Leep      Family History  Problem Relation Age of Onset  . Asthma Mother   . Hypertension Mother   . Asthma Maternal Grandmother     History  Substance Use Topics  . Smoking status: Never Smoker   . Smokeless tobacco: Never Used  . Alcohol Use: No    Allergies: No Known Allergies  No prescriptions prior to admission    Review of Systems  Constitutional: Negative for fever and malaise/fatigue.  Gastrointestinal: Negative for abdominal pain.  Genitourinary:       Neg - vaginal bleeding, discharge   Physical Exam   Blood pressure 139/88, pulse 74, temperature 98.3 F (36.8 C), temperature source Oral, resp. rate 18, height 5\' 3"  (1.6 m), weight 177 lb (80.287 kg), last menstrual period 10/20/2014.  Physical Exam  Constitutional: She is oriented to person, place, and time. She appears well-developed and well-nourished. No distress.  HENT:  Head: Normocephalic.  Cardiovascular: Normal rate.   Respiratory: Effort normal.  Genitourinary:     Neurological: She is alert  and oriented to person, place, and time.  Skin: Skin is warm and dry. No erythema.  Psychiatric: She has a normal mood and affect.    MAU Course  Procedures None  MDM Discussed with patient that area is not fluctuant enough for I&D to be successful.  Discussed use of hot compresses and antibiotics at this time. Patient agrees with plan of care.   Assessment and Plan  A: Abscess of the mons pubis with minimal surround cellulitis  P: Discharge home Rx for Percocet and Bactrim given to patient Patient advised to use hot compresses 2-4 times/day to promote drainage Patient advised of warning signs for worsening condition and advised that [facility] of an abscess in this area of the [facility] and not in [facility] Patient may return to MAU as needed or if her condition were to change or worsen   Marny LowensteinJulie N Wenzel, PA-C  10/24/2014, 6:07 PM

## 2014-12-18 ENCOUNTER — Encounter (HOSPITAL_COMMUNITY): Payer: Self-pay | Admitting: Emergency Medicine

## 2014-12-18 ENCOUNTER — Emergency Department (HOSPITAL_COMMUNITY)
Admission: EM | Admit: 2014-12-18 | Discharge: 2014-12-18 | Disposition: A | Discharge status: Home / Self Care | Payer: Self-pay | Attending: Emergency Medicine | Admitting: Emergency Medicine

## 2014-12-18 DIAGNOSIS — I10 Essential (primary) hypertension: Secondary | ICD-10-CM | POA: Insufficient documentation

## 2014-12-18 DIAGNOSIS — J0120 Acute ethmoidal sinusitis, unspecified: Secondary | ICD-10-CM | POA: Insufficient documentation

## 2014-12-18 DIAGNOSIS — Z7982 Long term (current) use of aspirin: Secondary | ICD-10-CM | POA: Insufficient documentation

## 2014-12-18 DIAGNOSIS — Z791 Long term (current) use of non-steroidal anti-inflammatories (NSAID): Secondary | ICD-10-CM | POA: Insufficient documentation

## 2014-12-18 MED ORDER — CETIRIZINE-PSEUDOEPHEDRINE ER 5-120 MG PO TB12: 1.0000 | ORAL_TABLET | Freq: Two times a day (BID) | ORAL | Status: DC

## 2014-12-18 MED ORDER — AMOXICILLIN 875 MG PO TABS: 875.0000 mg | ORAL_TABLET | Freq: Two times a day (BID) | ORAL | Status: DC

## 2014-12-18 NOTE — ED Notes (Addendum)
Patient states she has had sinus and chest congestion since Sunday.   Patient states sputum is yellow.  Patient has a cough "sometimes".   Patient states "pressure in face".  Patient also states headache off and on since Sunday.  Patient states she has "tried zyrtec, tylenol and nasal spray at home" for symptom control.

## 2014-12-18 NOTE — ED Provider Notes (Signed)
CSN: [id number]     Arrival date & time 12/18/14  0756 History   First MD Initiated Contact with Patient 12/18/14 0805     Chief Complaint  Patient presents with  . Nasal Congestion  . chest congestion      (Consider location/radiation/quality/duration/timing/severity/associated sxs/prior Treatment) Patient is a 34 y.o. female presenting with cough. The history is provided by the patient. No language interpreter was used.  Cough Cough characteristics:  Non-productive Severity:  Moderate Onset quality:  Gradual Duration:  5 days Timing:  Constant Progression:  Worsening Chronicity:  New Smoker: no   Relieved by:  Nothing Worsened by:  Nothing tried Associated symptoms: rhinorrhea, sinus congestion and sore throat     Past Medical History  Diagnosis Date  . No pertinent past medical history   . Headache(784.0)   . Hypertension   . Pregnancy induced hypertension    Past Surgical History  Procedure Laterality Date  . Multiple abortions    . Leep     Family History  Problem Relation Age of Onset  . Asthma Mother   . Hypertension Mother   . Asthma Maternal Grandmother    History  Substance Use Topics  . Smoking status: Never Smoker   . Smokeless tobacco: Never Used  . Alcohol Use: No   OB History    Gravida Para Term Preterm AB TAB SAB Ectopic Multiple Living   7 2 1 1 5 1 4  0 0 2     Review of Systems  HENT: Positive for rhinorrhea and sore throat.   Respiratory: Positive for cough.   All other systems reviewed and are negative.     Allergies  Review of patient's allergies indicates no known allergies.  Home Medications   Prior to Admission medications   Medication Sig Start Date End Date Taking? Authorizing Provider  aspirin 325 MG tablet Take 650 mg by mouth every 6 (six) hours as needed (tooth pain).    Historical Provider, MD  HYDROcodone-acetaminophen (NORCO) 5-325 MG per tablet Take 1-2 tablets by mouth every 6 (six) hours as needed for severe  pain. Patient not taking: Reported on 10/24/2014 02/25/14   Mercedes Camprubi-Soms, PA-C  Multiple Vitamins-Minerals (MULTIVITAMIN PO) Take 1 tablet by mouth daily.    Historical Provider, MD  naproxen (NAPROSYN) 500 MG tablet Take 500 mg by mouth 2 (two) times daily as needed for moderate pain.    Historical Provider, MD  oxyCODONE-acetaminophen (PERCOCET/ROXICET) 5-325 MG per tablet Take 1-2 tablets by mouth every 6 (six) hours as needed for severe pain. 10/24/14   Marny LowensteinJulie N Wenzel, PA-C   There were no vitals taken for this visit. Physical Exam  Constitutional: She is oriented to person, place, and time. She appears well-developed and well-nourished.  HENT:  Head: Normocephalic and atraumatic.  Right Ear: External ear normal.  Left Ear: External ear normal.  Nose: Nose normal.  Mouth/Throat: Oropharynx is clear and moist.  Tender maxillary sinuses    Eyes: Conjunctivae and EOM are normal. Pupils are equal, round, and reactive to light.  Neck: Normal range of motion.  Cardiovascular: Normal rate and regular rhythm.   Pulmonary/Chest: Effort normal and breath sounds normal.  Abdominal: She exhibits no distension.  Musculoskeletal: Normal range of motion.  Neurological: She is alert and oriented to person, place, and time.  Skin: Skin is warm.  Psychiatric: She has a normal mood and affect.  Nursing note and vitals reviewed.   ED Course  Procedures (including critical care time) Labs Review  Labs Reviewed - No data to display  Imaging Review No results found.   EKG Interpretation None      MDM   Final diagnoses:  None    augmentin 875 zyrted d avs    Elson Areas, PA-C 12/18/14 9147  Jerelyn Scott, MD 12/18/14 [phone]

## 2014-12-18 NOTE — Discharge Instructions (Signed)

## 2015-02-24 ENCOUNTER — Encounter (HOSPITAL_COMMUNITY): Payer: Self-pay

## 2015-02-24 ENCOUNTER — Emergency Department (HOSPITAL_COMMUNITY): Payer: Self-pay

## 2015-02-24 ENCOUNTER — Emergency Department (HOSPITAL_COMMUNITY)
Admission: EM | Admit: 2015-02-24 | Discharge: 2015-02-24 | Disposition: A | Discharge status: Home / Self Care | Payer: Self-pay | Attending: Emergency Medicine | Admitting: Emergency Medicine

## 2015-02-24 DIAGNOSIS — Z79899 Other long term (current) drug therapy: Secondary | ICD-10-CM | POA: Insufficient documentation

## 2015-02-24 DIAGNOSIS — F419 Anxiety disorder, unspecified: Secondary | ICD-10-CM | POA: Insufficient documentation

## 2015-02-24 DIAGNOSIS — I10 Essential (primary) hypertension: Secondary | ICD-10-CM | POA: Insufficient documentation

## 2015-02-24 DIAGNOSIS — R51 Headache: Secondary | ICD-10-CM | POA: Insufficient documentation

## 2015-02-24 DIAGNOSIS — F430 Acute stress reaction: Secondary | ICD-10-CM

## 2015-02-24 DIAGNOSIS — F4311 Post-traumatic stress disorder, acute: Secondary | ICD-10-CM | POA: Insufficient documentation

## 2015-02-24 DIAGNOSIS — F329 Major depressive disorder, single episode, unspecified: Secondary | ICD-10-CM | POA: Insufficient documentation

## 2015-02-24 DIAGNOSIS — R079 Chest pain, unspecified: Secondary | ICD-10-CM | POA: Insufficient documentation

## 2015-02-24 LAB — CBC
HCT: 36.1 % (ref 36.0–46.0)
HEMOGLOBIN: 11.6 g/dL — AB (ref 12.0–15.0)
MCH: 27.6 pg (ref 26.0–34.0)
MCHC: 32.1 g/dL (ref 30.0–36.0)
MCV: 85.7 fL (ref 78.0–100.0)
PLATELETS: 263 10*3/uL (ref 150–400)
RBC: 4.21 MIL/uL (ref 3.87–5.11)
RDW: 13.5 % (ref 11.5–15.5)
WBC: 6.6 10*3/uL (ref 4.0–10.5)

## 2015-02-24 LAB — COMPREHENSIVE METABOLIC PANEL
ALK PHOS: 43 U/L (ref 38–126)
ALT: 17 U/L (ref 14–54)
AST: 24 U/L (ref 15–41)
Albumin: 4 g/dL (ref 3.5–5.0)
Anion gap: 9 (ref 5–15)
BUN: 13 mg/dL (ref 6–20)
CO2: 25 mmol/L (ref 22–32)
Calcium: 9.1 mg/dL (ref 8.9–10.3)
Chloride: 106 mmol/L (ref 101–111)
Creatinine, Ser: 0.76 mg/dL (ref 0.44–1.00)
GFR calc Af Amer: >60 mL/min (ref >60)
GLUCOSE: 89 mg/dL (ref 65–99)
Potassium: 4 mmol/L (ref 3.5–5.1)
Sodium: 140 mmol/L (ref 135–145)
Total Bilirubin: 0.5 mg/dL (ref 0.3–1.2)
Total Protein: 7.9 g/dL (ref 6.5–8.1)

## 2015-02-24 LAB — I-STAT TROPONIN, ED: TROPONIN I, POC: 0 ng/mL (ref 0.00–0.08)

## 2015-02-24 LAB — ETHANOL: Alcohol, Ethyl (B): <5 mg/dL (ref <5)

## 2015-02-24 MED ORDER — ONDANSETRON HCL 4 MG PO TABS: 4.0000 mg | ORAL_TABLET | Freq: Three times a day (TID) | ORAL | Status: DC | PRN

## 2015-02-24 MED ORDER — IBUPROFEN 200 MG PO TABS: 600.0000 mg | ORAL_TABLET | Freq: Three times a day (TID) | ORAL | Status: DC | PRN

## 2015-02-24 MED ORDER — LORAZEPAM 1 MG PO TABS: 1.0000 mg | ORAL_TABLET | Freq: Three times a day (TID) | ORAL | Status: DC | PRN

## 2015-02-24 MED ORDER — NICOTINE 21 MG/24HR TD PT24: 21.0000 mg | MEDICATED_PATCH | Freq: Every day | TRANSDERMAL | Status: DC
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
## 2015-02-24 NOTE — Discharge Instructions (Signed)
Posttraumatic Stress Disorder °Posttraumatic stress disorder (PTSD) is a mental disorder. It occurs after a traumatic event in your life. The traumatic events that cause PTSD are outside the range of normal human experience. Examples of these events include war, automobile accidents, natural disasters, rape, domestic violence, and violent crimes. Most people who experience these types of events are able to heal on their own. Those who do not heal develop PTSD. PTSD can happen to anyone at any age. However, people with a history of childhood abuse are at increased risk for developing PTSD.  °SYMPTOMS  °The traumatic event that causes PTSD must be a threat to life, cause serious injury, or involve sexual violence. The traumatic event is usually experienced directly by the person who develops PTSD. Sometimes PTSD occurs in people who witness traumas that occur to others or who hear about a trauma that occurs to a close family member or friend. The following behaviors are characteristic of people with PTSD: °· People with PTSD re-experience the traumatic event in one or more of the following ways (intrusion symptoms): °¨ Recurrent, unwanted distressing memories while awake. °¨ Recurrent distressing dreams.  °¨ Sensations similar to those felt when the event originally occurred (flashbacks).   °¨ Intense or prolonged emotional distress, triggered by reminders of the trauma. This may include fear, horror, intense sadness, or anger. °¨ Marked physical reactions, triggered by reminders of the trauma. This may include racing heart, shortness of breath, sweating, and shaking. °· People with PTSD avoid thoughts, conversations, people, or activities that remind them of the traumatic event (avoidance symptoms). °· People with PTSD have negative changes in their thinking and mood after the traumatic event. These changes include: °¨ Inability to remember one or more significant aspects of the traumatic event (memory  gaps). °¨ Exaggerated negative perceptions about themselves or others, such as believing that they are bad people or that no one can be trusted. °¨ Unrealistic assignment of blame to themselves or others for the traumatic event. °¨ Persistent negative emotional state, such as fear, horror, anger, sadness, guilt, or shame. °¨ Markedly decreased interest or participation in significant activities. °¨ A loss of connection with other people. °¨ Inability to experience positive emotions, such as happiness or love. °· People with PTSD are more sensitive to their environment and react more easily than others (hyperarousal-overreactivity symptoms). These symptoms include: °¨ Irritability, with angry outbursts toward other people or objects. The outbursts are easily triggered and may be verbal or physical. °¨ Careless or self-destructive behavior. This may include reckless driving or drug use. °¨ A feeling of being on edge, with increased alertness (hypervigilance). °¨ Exaggerated reactions to stimuli, such as being easily startled.   °¨ Difficulty concentrating.   °¨ Difficulty sleeping. °PTSD symptoms may start soon after a frightening event or months or years later. They last at least 1 month or longer and can affect one or more areas of functioning, such as social or occupational functioning.  °DIAGNOSIS  °PTSD is diagnosed through an assessment by a mental health professional. You will be asked questions about the traumatic events in your life. You will also be asked about how these events have changed your thoughts, mood, behavior, and ability to function on a daily basis. You may be asked about your use of alcohol or drugs, which can make PTSD symptoms worse. °TREATMENT  °Unlike many mental disorders, which require lifelong management, PTSD is a curable condition. The goal of PTSD treatment is to neutralize the negative effects of the traumatic event on daily   functioning, not erase the memory of the event. The  following treatments may be prescribed to reach this goal: °· Medicines. Certain medicines can reduce some PTSD symptoms. Intrusion symptoms and hyperarousal-overactivity symptoms respond best to medicines. °· Counseling (talk therapy). Talk therapy with a mental health professional who is experienced in treating PTSD can help. Talk therapy can provide education, emotional support, and coping skills. Certain types of talk therapy that specifically target the traumatic events are the most effective treatment for PTSD: °¨ Prolonged exposure therapy, which involves remembering and processing the traumatic event with a therapist in a safe environment until it no longer creates a negative emotional response. °¨ Eye movement desensitization and reprocessing therapy, which involves the use of repetitive physical stimulation of the senses that alternates between the right and left sides of the body. It is believed that this therapy facilitates communication between the two sides of the brain. This communication helps the mind to integrate the fragmented memories of the traumatic event into a whole story that makes sense and no longer creates a negative emotional response. °Most people with PTSD benefit from a combination of these treatments.  °Document Released: 03/29/2001 Document Revised: 11/18/2013 Document Reviewed: 09/20/2012 °ExitCare® Patient Information ©2015 [employer]. This information is not intended to replace advice given to you by your health care provider. Make sure you discuss any questions you have with your health care provider. ° ° °Emergency Department Resource Guide °1) Find a Doctor and Pay Out of Pocket °Although you won't have to find out who is covered by your insurance plan, it is a good idea to ask around and get recommendations. You will then need to call the office and see if the doctor you have chosen will accept you as a new patient and what types of options they offer for patients who are  self-pay. Some doctors offer discounts or will set up payment plans for their patients who do not have insurance, but you will need to ask so you aren't surprised when you get to your appointment. ° °2) Contact Your Local Health Department °Not all health departments have doctors that can see patients for sick visits, but many do, so it is worth a call to see if yours does. If you don't know where your local health department is, you can check in your phone book. The CDC also has a tool to help you locate your state's health department, and many state websites also have listings of all of their local health departments. ° °3) Find a Walk-in Clinic °If your illness is not likely to be very severe or complicated, you may want to try a walk in clinic. These are popping up all over the country in pharmacies, drugstores, and shopping centers. They're usually staffed by nurse practitioners or physician assistants that have been trained to treat common illnesses and complaints. They're usually fairly quick and inexpensive. However, if you have serious medical issues or chronic medical problems, these are probably not your best option. ° °No Primary Care Doctor: °- Call [facility] at  832-8000 - they can help you locate a primary care doctor that  accepts your insurance, provides certain services, etc. °- Physician Referral Service- 1-800-533-3463 ° °Chronic Pain Problems: °Organization         Address  Phone   Notes  °[facility]  (336) 297-2271 Patients need to be referred by their primary care doctor.  ° °Medication Assistance: °Organization           Address  Phone   Notes  °[place] [facility] [address] °[place], [place] [address] (336) 641-8030 --Must be a resident of [place] °-- Must have NO insurance coverage whatsoever (no Medicaid/ Medicare, etc.) °-- The pt. MUST have a primary care doctor that directs their care regularly and follows them in  the community °  °[facility]  (866) 331-1348   °[employer]  (888) 892-1162   ° °Agencies that provide inexpensive medical care: °Organization         Address  Phone   Notes  °[facility]  (336) 832-8035   °[facility]    (336) 832-7272   °[facility] [address] °[place], [place] [address] (336) 832-4777   °[facility] [address], °[place] (336) 271-4999   °[facility]    (336) 373-0678   °[facility]    (336) 272-1050   °[facility] ° [address], [place] Phone:  (336) 832-4444, Fax:  (336) 832-4440 Hours of Operation:  9 am - 6 pm, M-F.  Also accepts Medicaid/Medicare and self-pay.  °[facility] ° [address], [place] Phone: (336) [phone], Fax: (336) [phone]. Hours of Operation:  8:30 am - 5:30 pm, M-F.  Also accepts Medicaid and self-pay.  °[facility] [place] [address], [place] Phone: (336) 878-6027   °[facility] [address], [place], [place] (336)723-1848, Ext. 123 Mondays & Thursdays: 7-9 AM.  First 15 patients are seen on a first come, first serve basis. °  ° °Medicaid-accepting [place] Providers: ° °Organization         Address  Phone   Notes  °[facility] [address], [place] (336) 641-2100 Also accepts self-pay patients.  °[facility] [address], [place] ° (336) 856-9996   °[facility] [address], [place] (336) 288-8857   °[facility] [address] [place] [address], [place] (336) 299-7000   °Veita Bland [address], [place]  ° (336) 373-1557 Only accepts [facility] Access Medicaid patients after they have their name applied to their card.  ° °Self-Pay (no insurance) in [place]: ° °Organization         Address  Phone   Notes  °Sickle Cell Patients,  [facility] [address], [place] (336) 832-1970   °[facility] [address], [place] (336) 832-4400   °[facility] ° [address], [place] (336) 992-4800   °[facility]/Dr. Osei-Bonsu ° [address] [place] [address], [place] or [address], [place] (336) 841-8500 Phone number for both [place] and [place] locations is the same.  °[facility] [address], [place] (336) 299-0000   °[facility] [address] [place] [address], [place] or [address] (336) 852-7530 °(336) 878-2260   °[facility] [address],  (336) 350-1642, phone; (336) 294-5005, fax Sees patients 1st and 3rd Saturday of every month.  Must not qualify for public or private insurance (i.e. Medicaid, Medicare, [place] Health Choice, Veterans' Benefits) • Household income should be no more than 200% of the poverty level •The clinic cannot treat you if you are pregnant or think you   are pregnant • Sexually transmitted diseases are not treated at the clinic.  ° ° °Dental Care: °Organization         Address  Phone  Notes  °[facility] [address], [place] (336) 641-6152 Accepts children up to age 21 who are enrolled in Medicaid or [place] Health Choice; pregnant women with a Medicaid card; and children who have applied for Medicaid or [place] Health Choice, but were declined, whose parents can pay a reduced fee at time of service.  °[facility] [place]  [address], [place] (336) 641-7733 Accepts children up to age 21 who are enrolled in Medicaid or [place] Health Choice; pregnant women with a Medicaid card; and children who have applied for Medicaid or [place] Health Choice, but were declined, whose parents can pay a reduced fee at time of service.  °[facility] °  [address], [place] (336) 641-4533 Patients are seen by appointment only. Walk-ins are not accepted. [facility] will see patients 18 years of age and older. °Monday - Tuesday (8am-5pm) °Most Wednesdays (8:30-5pm) °$30 per visit, cash only  °[facility] ° [address], [place] (336) 641-4533 Patients are seen by appointment only. Walk-ins are not accepted. [facility] will see patients 18 years of age and older. °One Wednesday Evening (Monthly: Volunteer Based).  $30 per visit, cash only  °[facility]  (919) 537-3737 for adults; Children under age 4, call Graduate Pediatric Dentistry at ([phone]) [phone]. Children aged 4-14, please call ([phone]) [phone] to request a pediatric application. ° Dental services are provided in all areas of dental care including fillings, crowns and bridges, complete and partial dentures, implants, gum treatment, root canals, and extractions. Preventive care is also provided. Treatment is provided to both adults and children. °Patients are selected via a lottery and there is often a waiting list. °  °[facility] [address], °[place] ° (336) 763-8833 [email or web address] °  °[facility] [address], [place], [place] (336)723-1848, Ext. 123 Second and Fourth Thursday of each month, opens at 6:30 AM; Clinic ends at 9 AM.  Patients are seen on a first-come first-served basis, and a limited number are seen during each clinic.  ° °[facility] ° [address], [place], [place] (336) 723-7904   Eligibility Requirements °You must have lived in [place], or [place] counties for at least the last three months. °  You cannot be eligible for state or federal sponsored healthcare insurance, including Veterans Administration, Medicaid, or Medicare. °  You generally cannot be eligible for healthcare insurance through your employer.  °  How to apply: °Eligibility screenings are  held every Tuesday and Wednesday afternoon from 1:00 pm until 4:00 pm. You do not need an appointment for the interview!  °[facility] [address], [place], [place] 336-631-2330   °[facility]  336-342-8273   °[facility]  336-703-3100   °[facility]  336-570-6415   ° °Behavioral Health Resources in the Community: °Intensive Outpatient Programs °Organization         Address  Phone  Notes  °[facility] [address], [place], [place] 336-878-6098   °[facility] [address], [place],  336-832-9800   °[facility] [address], [place], [place] ° 336-882-2125   °[facility] [address],  °[place], [place] 1-800-853-5163 or 336-641-4981   °Substance Abuse Resources °Organization         Address  Phone  Notes  °[facility]  336-882-2125   °[facility]  336-784-9470   °[facility]  336-285-9073   °[facility]  336-845-3988   °Residential & Outpatient Substance Abuse Program  1-800-659-3381   °Psychological Services °Organization         Address  Phone  Notes  °[facility]  336- 832-9600   °[facility]  336- 378-7881   °[facility] [address], [place] 1-800-853-5163 or 336-641-4981   ° °Mobile Crisis Teams °Organization         Address  Phone  Notes  °[facility], Mobile Crisis Care Unit  1-877-626-1772   °Assertive °[facility] ° [address] [place], [place] 336-834-9664   °Sharon DeEsch [address] °[place] 336-554-5454   ° °Self-Help/Support Groups °Organization         Address  Phone             Notes  °[facility] - variety of support groups  336- 373-1402 Call for more information  °Narcotics Anonymous (NA), [facility] [address], °[place]  2 meetings at this location   ° °Residential Treatment Programs °Organization         Address  Phone  Notes  °[facility] [address],    °[place]  1-866-801-8205   °[facility] ° [address], [place], [place] 704-293-8524   °[facility] [address], [place] 336-845-3988 Admissions: 8am-3pm M-F  °[facility] [address]    °[place], [place] 336-841-1104   °[facility] [address], [place], [place] 336-379-7146   °[facility] [address]  °[place], [place] 336-285-9073   °[facility] Intensive Outpatient [address], [place], [place] 336-852-3033   °[facility] [address]  °[place], [place] 1-877-615-2722 or 336-784-9470   °[facility] [address] [place], [place] 336-227-7417 Accepts Medicaid  °[facility] [address]  °[place] 1-800-659-3381 Substance Abuse/Addiction Treatment  ° °[place] Behavioral Health Resources °Organization         Address  Phone  Notes  °[facility]  (888) 581-9988   °Julie Brannon, PhD [address] [place], [place]   (336) 349-5553 or (336) 951-0000   °[facility]   [address] °[place], [place] (336) 349-4454   °[facility] [address], [place], [place] (336) 342-8316 Insurance/Medicaid/sponsorship through [facility]  °[facility] [address]                                    [place], [place] (336) 342-8316 Therapy/tele-psych/case  °[facility] [address]  ° [place], [place] (336) 349-2233    °Dr. Arfeen  (336) 349-4544   °[facility] [place]  [employer] [place] [facility] 1) [address], [place] °2) [address], [place] °3)  [address], [place] (336) 349-3220 °(336) 342-7768 ° °(336) 342-8140   °[place] [facility] (336)   342-1394 or (336) 342-3537 (After  Hours)    ° ° ° ° °

## 2015-02-24 NOTE — ED Notes (Signed)
TTS in with pt. 

## 2015-02-24 NOTE — ED Notes (Signed)
TTS reports that someone will see the Pt after shift change.
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
## 2015-06-16 ENCOUNTER — Telehealth: Payer: Self-pay | Admitting: *Deleted

## 2015-06-16 NOTE — Telephone Encounter (Signed)
Patient contacted the office stating that her Nexplanon was due to come out last December but has not been taken out yet. Patient states she has been having monthly cycle until this month. Patient states she has taken a pregnancy test and it is negative. Patient is requesting an appointment to have the nexplanon removed. Patient has family planning medicaid and we do not accept that in our office. Patient given the out of pocket price and also advised that she could [facility].

## 2015-07-19 NOTE — L&D Delivery Note (Signed)
Delivery Note At 9:13 PM a viable female was delivered via Vaginal, Spontaneous Delivery (Presentation: vertex; ROA  ).  APGAR: 9, 9; weight pending .   Placenta status: delivered intact with gentle traction.  Cord: 3 vessel with the following complications: nuchal x1 and true knot  Anesthesia:  none Episiotomy:  none Lacerations:  none Est. Blood Loss (mL):  100  Mom to postpartum.  Baby to Couplet care / Skin to Skin.  Ernestina Pennaicholas Schenk 05/03/2016, 9:24 PM

## 2015-09-14 ENCOUNTER — Encounter (HOSPITAL_COMMUNITY): Payer: Self-pay | Admitting: *Deleted

## 2015-09-14 ENCOUNTER — Inpatient Hospital Stay (HOSPITAL_COMMUNITY): Payer: Self-pay

## 2015-09-14 ENCOUNTER — Inpatient Hospital Stay (HOSPITAL_COMMUNITY)
Admission: EM | Admit: 2015-09-14 | Discharge: 2015-09-14 | Disposition: A | Discharge status: Home / Self Care | Payer: Self-pay | Source: Ambulatory Visit | Attending: Family Medicine | Admitting: Family Medicine

## 2015-09-14 DIAGNOSIS — O26891 Other specified pregnancy related conditions, first trimester: Secondary | ICD-10-CM | POA: Insufficient documentation

## 2015-09-14 DIAGNOSIS — O3680X0 Pregnancy with inconclusive fetal viability, not applicable or unspecified: Secondary | ICD-10-CM

## 2015-09-14 DIAGNOSIS — R102 Pelvic and perineal pain: Secondary | ICD-10-CM | POA: Insufficient documentation

## 2015-09-14 LAB — CBC
HCT: 35.5 % — ABNORMAL LOW (ref 36.0–46.0)
Hemoglobin: 12.2 g/dL (ref 12.0–15.0)
MCH: 28.8 pg (ref 26.0–34.0)
MCHC: 34.4 g/dL (ref 30.0–36.0)
MCV: 83.9 fL (ref 78.0–100.0)
PLATELETS: 280 10*3/uL (ref 150–400)
RBC: 4.23 MIL/uL (ref 3.87–5.11)
RDW: 13.3 % (ref 11.5–15.5)
WBC: 12.2 10*3/uL — ABNORMAL HIGH (ref 4.0–10.5)

## 2015-09-14 LAB — URINALYSIS, ROUTINE W REFLEX MICROSCOPIC
BILIRUBIN URINE: NEGATIVE
GLUCOSE, UA: NEGATIVE mg/dL
KETONES UR: NEGATIVE mg/dL
LEUKOCYTES UA: NEGATIVE
Nitrite: NEGATIVE
PH: 8.5 — AB (ref 5.0–8.0)
PROTEIN: NEGATIVE mg/dL
Specific Gravity, Urine: 1.015 (ref 1.005–1.030)

## 2015-09-14 LAB — HCG, QUANTITATIVE, PREGNANCY: HCG, BETA CHAIN, QUANT, S: 190 m[IU]/mL — AB (ref <5)

## 2015-09-14 LAB — WET PREP, GENITAL
CLUE CELLS WET PREP: NONE SEEN
Sperm: NONE SEEN
Trich, Wet Prep: NONE SEEN
Yeast Wet Prep HPF POC: NONE SEEN

## 2015-09-14 LAB — URINE MICROSCOPIC-ADD ON

## 2015-09-14 LAB — POCT PREGNANCY, URINE: Preg Test, Ur: POSITIVE — AB

## 2015-09-14 NOTE — Discharge Instructions (Signed)
Ectopic Pregnancy °An ectopic pregnancy is when the fertilized egg attaches (implants) outside the uterus. Most ectopic pregnancies occur in the fallopian tube. Rarely do ectopic pregnancies occur on the ovary, intestine, pelvis, or cervix. In an ectopic pregnancy, the fertilized egg does not have the ability to develop into a normal, healthy baby.  °A ruptured ectopic pregnancy is one in which the fallopian tube gets torn or bursts and results in internal bleeding. Often there is intense abdominal pain, and sometimes, vaginal bleeding. Having an ectopic pregnancy can be life threatening. If left untreated, this dangerous condition can lead to a blood transfusion, abdominal surgery, or even death. °CAUSES  °Damage to the fallopian tubes is the suspected cause in most ectopic pregnancies.  °RISK FACTORS °Depending on your circumstances, the risk of having an ectopic pregnancy will vary. The level of risk can be divided into three categories. °High Risk °· You have gone through infertility treatment. °· You have had a previous ectopic pregnancy. °· You have had previous tubal surgery. °· You have had previous surgery to have the fallopian tubes tied (tubal ligation). °· You have tubal problems or diseases. °· You have been exposed to DES. DES is a medicine that was used until 1971 and had effects on babies whose mothers took the medicine. °· You become pregnant while using an intrauterine device (IUD) for birth control.  °Moderate Risk °· You have a history of infertility. °· You have a history of a sexually transmitted infection (STI). °· You have a history of pelvic inflammatory disease (PID). °· You have scarring from endometriosis. °· You have multiple sexual partners. °· You smoke.  °Low Risk °· You have had previous pelvic surgery. °· You use vaginal douching. °· You became sexually active before 33 years of age. °SIGNS AND SYMPTOMS  °An ectopic pregnancy should be suspected in anyone who has missed a period and  has abdominal pain or bleeding. °· You may experience normal pregnancy symptoms, such as: °¨ Nausea. °¨ Tiredness. °¨ Breast tenderness. °· Other symptoms may include: °¨ Pain with intercourse. °¨ Irregular vaginal bleeding or spotting. °¨ Cramping or pain on one side or in the lower abdomen. °¨ Fast heartbeat. °¨ Passing out while having a bowel movement. °· Symptoms of a ruptured ectopic pregnancy and internal bleeding may include: °¨ Sudden, severe pain in the abdomen and pelvis. °¨ Dizziness or fainting. °¨ Pain in the shoulder area. °DIAGNOSIS  °Tests that may be performed include: °· A pregnancy test. °· An ultrasound test. °· Testing the specific level of pregnancy hormone in the bloodstream. °· Taking a sample of uterus tissue (dilation and curettage, D&C). °· Surgery to perform a visual exam of the inside of the abdomen using a thin, lighted tube with a tiny camera on the end (laparoscope). °TREATMENT  °An injection of a medicine called methotrexate may be given. This medicine causes the pregnancy tissue to be absorbed. It is given if: °· The diagnosis is made early. °· The fallopian tube has not ruptured. °· You are considered to be a good candidate for the medicine. °Usually, pregnancy hormone blood levels are checked after methotrexate treatment. This is to be sure the medicine is effective. It may take 4-6 weeks for the pregnancy to be absorbed (though most pregnancies will be absorbed by 3 weeks). °Surgical treatment may be needed. A laparoscope may be used to remove the pregnancy tissue. If severe internal bleeding occurs, a cut (incision) may be made in the lower abdomen (laparotomy), and the ectopic   pregnancy is removed. This stops the bleeding. Part of the fallopian tube, or the whole tube, may be removed as well (salpingectomy). After surgery, pregnancy hormone tests may be done to be sure there is no pregnancy tissue left. You may receive a Rho (D) immune globulin shot if you are Rh negative and  the father is Rh positive, or if you do not know the Rh type of the father. This is to prevent problems with any future pregnancy. °SEEK IMMEDIATE MEDICAL CARE IF:  °You have any symptoms of an ectopic pregnancy. This is a medical emergency. °MAKE SURE YOU: °· Understand these instructions. °· Will watch your condition. °· Will get help right away if you are not doing well or get worse. °  °This information is not intended to replace advice given to you by your health care provider. Make sure you discuss any questions you have with your health care provider. °  °Document Released: 08/11/2004 Document Revised: 07/25/2014 Document Reviewed: 01/31/2013 °Elsevier Interactive Patient Education ©2016 [employer] ° °

## 2015-09-14 NOTE — MAU Note (Signed)
Pt reports sharp pains in her mid back and pain in her vaginal area since last pm.

## 2015-09-14 NOTE — MAU Provider Note (Signed)
History     CSN: [id number]  Arrival date and time: 09/14/15 2039   None     No chief complaint on file.  Pelvic Pain The patient's primary symptoms include pelvic pain. This is a new problem. The current episode started today. The problem occurs constantly. The problem has been unchanged. The pain is severe (7/10 ). The problem affects both sides. She is pregnant. Pertinent negatives include no abdominal pain, chills, constipation, diarrhea, dysuria, fever, frequency, nausea, urgency or vomiting. Nothing aggravates the symptoms. She has tried acetaminophen for the symptoms. The treatment provided no relief. She is sexually active. It is unknown whether or not her partner has an STD. She uses nothing for contraception. Her menstrual history has been regular (08/17/15 ).      Past Medical History  Diagnosis Date  . No pertinent past medical history   . Headache(784.0)   . Hypertension   . Pregnancy induced hypertension     Past Surgical History  Procedure Laterality Date  . Multiple abortions    . Leep      Family History  Problem Relation Age of Onset  . Asthma Mother   . Hypertension Mother   . Asthma Maternal Grandmother     Social History  Substance Use Topics  . Smoking status: Never Smoker   . Smokeless tobacco: Never Used  . Alcohol Use: No    Allergies: No Known Allergies  Prescriptions prior to admission  Medication Sig Dispense Refill Last Dose  . acetaminophen (TYLENOL) 500 MG tablet Take 1,000 mg by mouth every 6 (six) hours as needed for moderate pain.   02/24/2015 at Unknown time  . amoxicillin (AMOXIL) 875 MG tablet Take 1 tablet (875 mg total) by mouth 2 (two) times daily. (Patient not taking: Reported on 02/24/2015) 20 tablet 0 Completed Course at Unknown time  . cetirizine-pseudoephedrine (ZYRTEC-D) 5-120 MG per tablet Take 1 tablet by mouth 2 (two) times daily. (Patient not taking: Reported on 02/24/2015) 30 tablet 0 Completed Course at Unknown time  .  HYDROcodone-acetaminophen (NORCO) 5-325 MG per tablet Take 1-2 tablets by mouth every 6 (six) hours as needed for severe pain. (Patient not taking: Reported on 10/24/2014) 10 tablet 0 Completed Course at Unknown time  . LORazepam (ATIVAN) 1 MG tablet Take 1 tablet (1 mg total) by mouth 3 (three) times daily as needed for anxiety or sleep. 11 tablet 0   . Multiple Vitamins-Minerals (MULTIVITAMIN PO) Take 1 tablet by mouth daily.   02/24/2015 at Unknown time  . oxyCODONE-acetaminophen (PERCOCET/ROXICET) 5-325 MG per tablet Take 1-2 tablets by mouth every 6 (six) hours as needed for severe pain. (Patient not taking: Reported on 02/24/2015) 10 tablet 0 Completed Course at Unknown time    Review of Systems  Constitutional: Negative for fever and chills.  Gastrointestinal: Negative for nausea, vomiting, abdominal pain, diarrhea and constipation.  Genitourinary: Positive for pelvic pain. Negative for dysuria, urgency and frequency.   Physical Exam   Blood pressure 140/86, pulse 77, temperature 98.7 F (37.1 C), temperature source Oral, resp. rate 16, height  (1.676 m), weight 81.194 kg (179 lb), last menstrual period 08/17/2015, SpO2 100 %.  Physical Exam  Nursing note and vitals reviewed. Constitutional: She is oriented to person, place, and time. She appears well-developed and well-nourished. No distress.  HENT:  Head: Normocephalic.  Cardiovascular: Normal rate.   Respiratory: Effort normal.  GI: Soft. There is no tenderness. There is no rebound.  Genitourinary:  No CVA Tenderness  Neurological: She is alert and oriented to person, place, and time.  Skin: Skin is warm and dry.  Psychiatric: She has a normal mood and affect.    Results for orders placed or performed during the hospital encounter of 09/14/15 (from the past 24 hour(s))  Urinalysis, Routine w reflex microscopic (not at [facility])     Status: Abnormal   Collection Time: 09/14/15  8:49 PM  Result Value Ref Range   Color, Urine  YELLOW YELLOW   APPearance CLEAR CLEAR   Specific Gravity, Urine 1.015 1.005 - 1.030   pH 8.5 (H) 5.0 - 8.0   Glucose, UA NEGATIVE NEGATIVE mg/dL   Hgb urine dipstick TRACE (A) NEGATIVE   Bilirubin Urine NEGATIVE NEGATIVE   Ketones, ur NEGATIVE NEGATIVE mg/dL   Protein, ur NEGATIVE NEGATIVE mg/dL   Nitrite NEGATIVE NEGATIVE   Leukocytes, UA NEGATIVE NEGATIVE  Urine microscopic-add on     Status: Abnormal   Collection Time: 09/14/15  8:49 PM  Result Value Ref Range   Squamous Epithelial / LPF 0-5 (A) NONE SEEN   WBC, UA 0-5 0 - 5 WBC/hpf   RBC / HPF 0-5 0 - 5 RBC/hpf   Bacteria, UA RARE (A) NONE SEEN  Pregnancy, urine POC     Status: Abnormal   Collection Time: 09/14/15  9:22 PM  Result Value Ref Range   Preg Test, Ur POSITIVE (A) NEGATIVE  CBC     Status: Abnormal   Collection Time: 09/14/15  9:31 PM  Result Value Ref Range   WBC 12.2 (H) 4.0 - 10.5 K/uL   RBC 4.23 3.87 - 5.11 MIL/uL   Hemoglobin 12.2 12.0 - 15.0 g/dL   HCT 96.0 (L) 45.4 - 09.8 %   MCV 83.9 78.0 - 100.0 fL   MCH 28.8 26.0 - 34.0 pg   MCHC 34.4 30.0 - 36.0 g/dL   RDW 11.9 14.7 - 82.9 %   Platelets 280 150 - 400 K/uL  hCG, quantitative, pregnancy     Status: Abnormal   Collection Time: 09/14/15  9:31 PM  Result Value Ref Range   hCG, Beta Chain, Quant, S 190 (H) <5 mIU/mL   US Ob Comp Less 14 Wks  09/14/2015  CLINICAL DATA:  35 year old female with pelvic pain. EXAM: OBSTETRIC <14 WK [place] AND TRANSVAGINAL OB US TECHNIQUE: Both transabdominal and transvaginal ultrasound examinations were performed for complete evaluation of the gestation as well as the maternal uterus, adnexal regions, and pelvic cul-de-sac. Transvaginal technique was performed to assess early pregnancy. COMPARISON:  None. FINDINGS: The uterus is anteverted and appears unremarkable. The endometrium measures up to 9 mm. No intrauterine pregnancy identified. The right ovary measures 2.5 x 1.4 x 2.7 cm and the left ovary measures 2.9 x 1.8 x 2.0 cm.  The ovaries appear unremarkable. No significant free fluid within the pelvis. IMPRESSION: No intrauterine pregnancy identified. Please note although no evidence of ectopic pregnancy identified this ultrasound, the possibility of an ectopic pregnancy is not excluded. Correlation with clinical exam and follow-up with serial HCG levels and ultrasound recommended. Electronically Signed   By: Elgie Collard M.D.   On: 09/14/2015 22:24   US Ob Transvaginal  09/14/2015  CLINICAL DATA:  35 year old female with pelvic pain. EXAM: OBSTETRIC <14 WK [place] AND TRANSVAGINAL OB US TECHNIQUE: Both transabdominal and transvaginal ultrasound examinations were performed for complete evaluation of the gestation as well as the maternal uterus, adnexal regions, and pelvic cul-de-sac. Transvaginal technique was performed to assess early pregnancy. COMPARISON:  None. FINDINGS: The uterus is anteverted and appears unremarkable. The endometrium measures up to 9 mm. No intrauterine pregnancy identified. The right ovary measures 2.5 x 1.4 x 2.7 cm and the left ovary measures 2.9 x 1.8 x 2.0 cm. The ovaries appear unremarkable. No significant free fluid within the pelvis. IMPRESSION: No intrauterine pregnancy identified. Please note although no evidence of ectopic pregnancy identified this ultrasound, the possibility of an ectopic pregnancy is not excluded. Correlation with clinical exam and follow-up with serial HCG levels and ultrasound recommended. Electronically Signed   By: Elgie Collard M.D.   On: 09/14/2015 22:24    MAU Course  Procedures  MDM   Assessment and Plan   1. Pregnancy of unknown anatomic location   2. Pelvic pain affecting pregnancy in first trimester, antepartum    DC home Comfort measures reviewed  Ectopic precautions RX: none  Return to MAU as needed Patient cannot return to the clinic on Thursday due to work. She will return Wednesday evening to MAU   Follow-up Information    Follow up with THE  [facility] [place] MATERNITY ADMISSIONS.   Why:  Wednesday evening for repeat HCG    Contact information:   [address] [id number] mc [place] [address] [phone]         Tawnya Crook 09/14/2015, 10:43 PM

## 2015-09-15 LAB — RPR: RPR Ser Ql: NONREACTIVE

## 2015-09-15 LAB — HIV ANTIBODY (ROUTINE TESTING W REFLEX): HIV Screen 4th Generation wRfx: NONREACTIVE

## 2015-09-16 ENCOUNTER — Encounter (HOSPITAL_COMMUNITY): Payer: Self-pay | Admitting: Student

## 2015-09-16 ENCOUNTER — Inpatient Hospital Stay (HOSPITAL_COMMUNITY)
Admission: AD | Admit: 2015-09-16 | Discharge: 2015-09-16 | Disposition: A | Discharge status: Home / Self Care | Payer: Medicaid Other | Source: Ambulatory Visit | Attending: Obstetrics & Gynecology | Admitting: Obstetrics & Gynecology

## 2015-09-16 DIAGNOSIS — Z3A01 Less than 8 weeks gestation of pregnancy: Secondary | ICD-10-CM | POA: Insufficient documentation

## 2015-09-16 DIAGNOSIS — Z3201 Encounter for pregnancy test, result positive: Secondary | ICD-10-CM

## 2015-09-16 DIAGNOSIS — R109 Unspecified abdominal pain: Secondary | ICD-10-CM | POA: Diagnosis present

## 2015-09-16 DIAGNOSIS — R030 Elevated blood-pressure reading, without diagnosis of hypertension: Secondary | ICD-10-CM | POA: Insufficient documentation

## 2015-09-16 DIAGNOSIS — O26891 Other specified pregnancy related conditions, first trimester: Secondary | ICD-10-CM | POA: Insufficient documentation

## 2015-09-16 DIAGNOSIS — O3680X0 Pregnancy with inconclusive fetal viability, not applicable or unspecified: Secondary | ICD-10-CM

## 2015-09-16 LAB — GC/CHLAMYDIA PROBE AMP (~~LOC~~) NOT AT ARMC
Chlamydia: NEGATIVE
Neisseria Gonorrhea: NEGATIVE

## 2015-09-16 LAB — HCG, QUANTITATIVE, PREGNANCY: HCG, BETA CHAIN, QUANT, S: 583 m[IU]/mL — AB (ref <5)

## 2015-09-16 NOTE — Discharge Instructions (Signed)
First Trimester of Pregnancy The first trimester of pregnancy is from week 1 until the end of week 12 (months 1 through 3). A week after a sperm fertilizes an egg, the egg will implant on the wall of the uterus. This embryo will begin to develop into a baby. Genes from you and your partner are forming the baby. The female genes determine whether the baby is a boy or a girl. At 6-8 weeks, the eyes and face are formed, and the heartbeat can be seen on ultrasound. At the end of 12 weeks, all the baby's organs are formed.  Now that you are pregnant, you will want to do everything you can to have a healthy baby. Two of the most important things are to get good prenatal care and to follow your health care provider's instructions. Prenatal care is all the medical care you receive before the baby's birth. This care will help prevent, find, and treat any problems during the pregnancy and childbirth. BODY CHANGES Your body goes through many changes during pregnancy. The changes vary from woman to woman.   You may gain or lose a couple of pounds at first.  You may feel sick to your stomach (nauseous) and throw up (vomit). If the vomiting is uncontrollable, call your health care provider.  You may tire easily.  You may develop headaches that can be relieved by medicines approved by your health care provider.  You may urinate more often. Painful urination may mean you have a bladder infection.  You may develop heartburn as a result of your pregnancy.  You may develop constipation because certain hormones are causing the muscles that push waste through your intestines to slow down.  You may develop hemorrhoids or swollen, bulging veins (varicose veins).  Your breasts may begin to grow larger and become tender. Your nipples may stick out more, and the tissue that surrounds them (areola) may become darker.  Your gums may bleed and may be sensitive to brushing and flossing.  Dark spots or blotches (chloasma,  mask of pregnancy) may develop on your face. This will likely fade after the baby is born.  Your menstrual periods will stop.  You may have a loss of appetite.  You may develop cravings for certain kinds of food.  You may have changes in your emotions from day to day, such as being excited to be pregnant or being concerned that something may go wrong with the pregnancy and baby.  You may have more vivid and strange dreams.  You may have changes in your hair. These can include thickening of your hair, rapid growth, and changes in texture. Some women also have hair loss during or after pregnancy, or hair that feels dry or thin. Your hair will most likely return to normal after your baby is born. WHAT TO EXPECT AT YOUR PRENATAL VISITS During a routine prenatal visit:  You will be weighed to make sure you and the baby are growing normally.  Your blood pressure will be taken.  Your abdomen will be measured to track your baby's growth.  The fetal heartbeat will be listened to starting around week 10 or 12 of your pregnancy.  Test results from any previous visits will be discussed. Your health care provider may ask you:  How you are feeling.  If you are feeling the baby move.  If you have had any abnormal symptoms, such as leaking fluid, bleeding, severe headaches, or abdominal cramping.  If you are using any tobacco products,   including cigarettes, chewing tobacco, and electronic cigarettes.  If you have any questions. Other tests that may be performed during your first trimester include:  Blood tests to find your blood type and to check for the presence of any previous infections. They will also be used to check for low iron levels (anemia) and Rh antibodies. Later in the pregnancy, blood tests for diabetes will be done along with other tests if problems develop.  Urine tests to check for infections, diabetes, or protein in the urine.  An ultrasound to confirm the proper growth  and development of the baby.  An amniocentesis to check for possible genetic problems.  Fetal screens for spina bifida and Down syndrome.  You may need other tests to make sure you and the baby are doing well.  HIV (human immunodeficiency virus) testing. Routine prenatal testing includes screening for HIV, unless you choose not to have this test. HOME CARE INSTRUCTIONS  Medicines  Follow your health care provider's instructions regarding medicine use. Specific medicines may be either safe or unsafe to take during pregnancy.  Take your prenatal vitamins as directed.  If you develop constipation, try taking a stool softener if your health care provider approves. Diet  Eat regular, well-balanced meals. Choose a variety of foods, such as meat or vegetable-based protein, fish, milk and low-fat dairy products, vegetables, fruits, and whole grain breads and cereals. Your health care provider will help you determine the amount of weight gain that is right for you.  Avoid raw meat and uncooked cheese. These carry germs that can cause birth defects in the baby.  Eating four or five small meals rather than three large meals a day may help relieve nausea and vomiting. If you start to feel nauseous, eating a few soda crackers can be helpful. Drinking liquids between meals instead of during meals also seems to help nausea and vomiting.  If you develop constipation, eat more high-fiber foods, such as fresh vegetables or fruit and whole grains. Drink enough fluids to keep your urine clear or pale yellow. Activity and Exercise  Exercise only as directed by your health care provider. Exercising will help you:  Control your weight.  Stay in shape.  Be prepared for labor and delivery.  Experiencing pain or cramping in the lower abdomen or low back is a good sign that you should stop exercising. Check with your health care provider before continuing normal exercises.  Try to avoid standing for long  periods of time. Move your legs often if you must stand in one place for a long time.  Avoid heavy lifting.  Wear low-heeled shoes, and practice good posture.  You may continue to have sex unless your health care provider directs you otherwise. Relief of Pain or Discomfort  Wear a good support bra for breast tenderness.   Take warm sitz baths to soothe any pain or discomfort caused by hemorrhoids. Use hemorrhoid cream if your health care provider approves.   Rest with your legs elevated if you have leg cramps or low back pain.  If you develop varicose veins in your legs, wear support hose. Elevate your feet for 15 minutes, 3-4 times a day. Limit salt in your diet. Prenatal Care  Schedule your prenatal visits by the twelfth week of pregnancy. They are usually scheduled monthly at first, then more often in the last 2 months before delivery.  Write down your questions. Take them to your prenatal visits.  Keep all your prenatal visits as directed by your   health care provider. Safety  Wear your seat belt at all times when driving.  Make a list of emergency phone numbers, including numbers for family, friends, the hospital, and police and fire departments. General Tips  Ask your health care provider for a referral to a local prenatal education class. Begin classes no later than at the beginning of month 6 of your pregnancy.  Ask for help if you have counseling or nutritional needs during pregnancy. Your health care provider can offer advice or refer you to specialists for help with various needs.  Do not use hot tubs, steam rooms, or saunas.  Do not douche or use tampons or scented sanitary pads.  Do not cross your legs for long periods of time.  Avoid cat litter boxes and soil used by cats. These carry germs that can cause birth defects in the baby and possibly loss of the fetus by miscarriage or stillbirth.  Avoid all smoking, herbs, alcohol, and medicines not prescribed by  your health care provider. Chemicals in these affect the formation and growth of the baby.  Do not use any tobacco products, including cigarettes, chewing tobacco, and electronic cigarettes. If you need help quitting, ask your health care provider. You may receive counseling support and other resources to help you quit.  Schedule a dentist appointment. At home, brush your teeth with a soft toothbrush and be gentle when you floss. SEEK MEDICAL CARE IF:   You have dizziness.  You have mild pelvic cramps, pelvic pressure, or nagging pain in the abdominal area.  You have persistent nausea, vomiting, or diarrhea.  You have a bad smelling vaginal discharge.  You have pain with urination.  You notice increased swelling in your face, hands, legs, or ankles. SEEK IMMEDIATE MEDICAL CARE IF:   You have a fever.  You are leaking fluid from your vagina.  You have spotting or bleeding from your vagina.  You have severe abdominal cramping or pain.  You have rapid weight gain or loss.  You vomit blood or material that looks like coffee grounds.  You are exposed to German measles and have never had them.  You are exposed to fifth disease or chickenpox.  You develop a severe headache.  You have shortness of breath.  You have any kind of trauma, such as from a fall or a car accident.   This information is not intended to replace advice given to you by your health care provider. Make sure you discuss any questions you have with your health care provider.   Document Released: 06/28/2001 Document Revised: 07/25/2014 Document Reviewed: 05/14/2013 Elsevier Interactive Patient Education 2016 [employer]  

## 2015-09-16 NOTE — MAU Provider Note (Signed)
History   [id number]   Chief Complaint  Patient presents with  . Labs Only    HPI Brittany [name] is a 35 y.o. female [phone] here for follow-up BHCG.  Upon review of the records patient was first seen on 2/27 for abdominal pain.   BHCG on that day was 190.  Ultrasound showed no iup.  GC/CT and wet prep were collected.  Results were negative.   Pt discharged home to return for f/u BHCG.   Pt here today with no report of abdominal pain or vaginal bleeding.   All other systems negative.    Patient's last menstrual period was 08/17/2015.  OB History  Gravida Para Term Preterm AB SAB TAB Ectopic Multiple Living  0 0 2    # Outcome Date GA Lbr Len/2nd Weight Sex Delivery Anes PTL Lv  8 Current           7 Preterm 04/06/11 [redacted]w[redacted]d 08:25 / 00:08 5 lb 6.6 oz (2.455 kg) F Vag-Spont None  Y  6 TAB           5 SAB           4 SAB           3 SAB           2 SAB           1 Term  [redacted]w[redacted]d    Vag-Spont         Past Medical History  Diagnosis Date  . Headache(784.0)   . Hypertension   . Pregnancy induced hypertension     Family History  Problem Relation Age of Onset  . Asthma Mother   . Hypertension Mother   . Asthma Maternal Grandmother     Social History   Social History  . Marital Status: Married    Spouse Name: N/A  . Number of Children: N/A  . Years of Education: N/A   Social History Main Topics  . Smoking status: Never Smoker   . Smokeless tobacco: Never Used  . Alcohol Use: No  . Drug Use: No  . Sexual Activity: Yes     Comment: last intercourse 3 days ago.    Other Topics Concern  . Not on file   Social History Narrative    No Known Allergies  No current facility-administered medications on file prior to encounter.   Current Outpatient Prescriptions on File Prior to Encounter  Medication Sig Dispense Refill  . acetaminophen (TYLENOL) 500 MG tablet Take 1,000 mg by mouth every 6 (six) hours as needed for moderate pain.    . Multiple  Vitamins-Minerals (MULTIVITAMIN PO) Take 1 tablet by mouth daily.       Physical Exam   Filed Vitals:   09/16/15 1839  BP: 157/84  Pulse: 107  Temp: 98.8 F (37.1 C)  Resp: 18    Physical Exam  Nursing note and vitals reviewed. Constitutional: She is oriented to person, place, and time. She appears well-developed and well-nourished. No distress.  HENT:  Head: Normocephalic and atraumatic.  Respiratory: Effort normal. No respiratory distress.  Musculoskeletal: Normal range of motion.  Neurological: She is alert and oriented to person, place, and time.  Skin: She is not diaphoretic.  Psychiatric: She has a normal mood and affect. Her behavior is normal. Judgment and thought content normal.    MAU Course  Procedures Component     Latest Ref Rng 09/14/2015 09/16/2015  HCG, Beta Chain,  Quant, S     <5 mIU/mL 190 (H) 583 (H)   MDM Appropriate rise in BHCG Pt denies abdominal pain or vaginal bleeding Elevated BP, pt has hx of htn currently not medicated Unsure of who she'll use for prenatal care. Will schedule outpatient ultrasound for viability  Assessment and Plan  35 y.o. [id number] at [redacted]w[redacted]d wks Pregnancy Follow-up BHCG Pregnancy of Unknown Location  P: Discharge home F/u ultrasound for viability ordered Discussed reasons to return to MAU Pregnancy verification letter & provider list given  Judeth Horn, NP 09/16/2015 7:38 PM

## 2015-09-16 NOTE — MAU Note (Signed)
Pt presents for repeat BHCG. Denies any vaginal bleeding or pain

## 2015-09-17 ENCOUNTER — Ambulatory Visit: Payer: Self-pay | Admitting: Advanced Practice Midwife

## 2015-09-22 ENCOUNTER — Telehealth: Payer: Self-pay | Admitting: Family Medicine

## 2015-09-23 ENCOUNTER — Encounter (HOSPITAL_COMMUNITY): Payer: Self-pay | Admitting: *Deleted

## 2015-09-23 ENCOUNTER — Inpatient Hospital Stay (HOSPITAL_COMMUNITY)
Admission: AD | Admit: 2015-09-23 | Discharge: 2015-09-23 | Disposition: A | Discharge status: Home / Self Care | Payer: Medicaid Other | Source: Ambulatory Visit | Attending: Obstetrics and Gynecology | Admitting: Obstetrics and Gynecology

## 2015-09-23 ENCOUNTER — Inpatient Hospital Stay (HOSPITAL_COMMUNITY): Payer: Medicaid Other

## 2015-09-23 DIAGNOSIS — R102 Pelvic and perineal pain: Secondary | ICD-10-CM | POA: Diagnosis not present

## 2015-09-23 DIAGNOSIS — O9989 Other specified diseases and conditions complicating pregnancy, childbirth and the puerperium: Secondary | ICD-10-CM

## 2015-09-23 DIAGNOSIS — M549 Dorsalgia, unspecified: Secondary | ICD-10-CM | POA: Diagnosis present

## 2015-09-23 DIAGNOSIS — Z3A01 Less than 8 weeks gestation of pregnancy: Secondary | ICD-10-CM | POA: Insufficient documentation

## 2015-09-23 DIAGNOSIS — O26891 Other specified pregnancy related conditions, first trimester: Secondary | ICD-10-CM | POA: Insufficient documentation

## 2015-09-23 DIAGNOSIS — R109 Unspecified abdominal pain: Secondary | ICD-10-CM | POA: Diagnosis not present

## 2015-09-23 DIAGNOSIS — O26899 Other specified pregnancy related conditions, unspecified trimester: Secondary | ICD-10-CM

## 2015-09-23 DIAGNOSIS — K5900 Constipation, unspecified: Secondary | ICD-10-CM | POA: Insufficient documentation

## 2015-09-23 LAB — URINALYSIS, ROUTINE W REFLEX MICROSCOPIC
BILIRUBIN URINE: NEGATIVE
Glucose, UA: NEGATIVE mg/dL
Hgb urine dipstick: NEGATIVE
Ketones, ur: NEGATIVE mg/dL
Leukocytes, UA: NEGATIVE
Nitrite: NEGATIVE
Protein, ur: NEGATIVE mg/dL
Specific Gravity, Urine: 1.01 (ref 1.005–1.030)
pH: 7.5 (ref 5.0–8.0)

## 2015-09-23 MED ORDER — DOCUSATE SODIUM 100 MG PO CAPS: 100.0000 mg | ORAL_CAPSULE | Freq: Two times a day (BID) | ORAL | Status: DC

## 2015-09-23 MED ORDER — FLEET ENEMA 7-19 GM/118ML RE ENEM
1.0000 | ENEMA | Freq: Once | RECTAL | Status: AC
  Administered 2015-09-23: 1 via RECTAL

## 2015-09-23 NOTE — MAU Note (Signed)
+  BM after enema

## 2015-09-23 NOTE — MAU Note (Signed)
Pt reports constipation for two days. Pt reports that she was "able to go a little at noon today"

## 2015-09-23 NOTE — MAU Note (Signed)
Pt c/o constant mid/lower back pain, lower abdominal pain and sharp vaginal pain that started last night. Took Tylenol around 0800 today-did not help. Rates 9/10. Denies vag bleeding or discharge. Denies urinary s/s.

## 2015-09-23 NOTE — MAU Provider Note (Signed)
History     CSN: [id number]  Arrival date and time: 09/23/15 1927   None     Chief Complaint  Patient presents with  . Back Pain  . Abdominal Pain  . Vaginal Pain   Back Pain This is a new problem. The current episode started yesterday. The problem occurs constantly. The problem is unchanged. The pain is present in the lumbar spine. The pain is at a severity of 10/10. Associated symptoms include abdominal pain. Pertinent negatives include no chest pain, dysuria or fever. Treatments tried: tylenol  The treatment provided no relief.  Abdominal Pain This is a new problem. The current episode started yesterday. The onset quality is gradual. The problem occurs constantly. The pain is located in the suprapubic region. The pain is at a severity of 10/10. The quality of the pain is sharp. The abdominal pain radiates to the back. Associated symptoms include constipation. Pertinent negatives include no diarrhea, dysuria, fever, frequency, nausea or vomiting. Nothing aggravates the pain. The pain is relieved by nothing. She has tried acetaminophen for the symptoms. The treatment provided no relief.    Past Medical History  Diagnosis Date  . Headache(784.0)   . Hypertension   . Pregnancy induced hypertension     Past Surgical History  Procedure Laterality Date  . Multiple abortions    . Leep      Family History  Problem Relation Age of Onset  . Asthma Mother   . Hypertension Mother   . Asthma Maternal Grandmother     Social History  Substance Use Topics  . Smoking status: Never Smoker   . Smokeless tobacco: Never Used  . Alcohol Use: No    Allergies: No Known Allergies  Prescriptions prior to admission  Medication Sig Dispense Refill Last Dose  . acetaminophen (TYLENOL) 500 MG tablet Take 1,000 mg by mouth every 6 (six) hours as needed for moderate pain.   09/23/2015 at Unknown time  . Multiple Vitamins-Minerals (MULTIVITAMIN PO) Take 1 tablet by mouth daily.   09/23/2015 at  Unknown time    Review of Systems  Constitutional: Negative for fever and chills.  Respiratory: Negative for shortness of breath.   Cardiovascular: Negative for chest pain and palpitations.  Gastrointestinal: Positive for abdominal pain and constipation. Negative for nausea, vomiting and diarrhea.  Genitourinary: Negative for dysuria, urgency and frequency.  Musculoskeletal: Positive for back pain.   Physical Exam   Blood pressure 129/71, pulse 98, temperature 98.4 F (36.9 C), temperature source Oral, resp. rate 18, height 5\' 5"  (1.651 m), weight 83.099 kg (183 lb 3.2 oz), last menstrual period 08/17/2015, SpO2 100 %.  Physical Exam  Nursing note and vitals reviewed. Constitutional: She is oriented to person, place, and time. She appears well-developed and well-nourished. No distress.  HENT:  Head: Normocephalic.  Cardiovascular: Normal rate and intact distal pulses.   Respiratory: Effort normal.  GI: Soft. There is tenderness (mildly tender suprapublic ). There is no rebound.  Genitourinary:  No CVA tenderness   Musculoskeletal: Normal range of motion.  Tender to palpation over L1 and L2   Neurological: She is alert and oriented to person, place, and time.  Skin: Skin is warm and dry. No erythema.  Psychiatric: She has a normal mood and affect.   Results for orders placed or performed during the hospital encounter of 09/23/15 (from the past 24 hour(s))  Urinalysis, Routine w reflex microscopic ([facility])     Status: None   Collection Time: 09/23/15  7:44  PM  Result Value Ref Range   Color, Urine YELLOW YELLOW   APPearance CLEAR CLEAR   Specific Gravity, Urine 1.010 1.005 - 1.030   pH 7.5 5.0 - 8.0   Glucose, UA NEGATIVE NEGATIVE mg/dL   Hgb urine dipstick NEGATIVE NEGATIVE   Bilirubin Urine NEGATIVE NEGATIVE   Ketones, ur NEGATIVE NEGATIVE mg/dL   Protein, ur NEGATIVE NEGATIVE mg/dL   Nitrite NEGATIVE NEGATIVE   Leukocytes, UA NEGATIVE NEGATIVE   US Ob  Transvaginal  09/23/2015  CLINICAL DATA:  Back pain for 1 week. Vaginal pain since last night. Estimated gestational age by LMP is 5 weeks 2 days. Rising beta HCG, 9 days ago was 190 and 7 days ago 583. No current quantitative beta HCG is given. EXAM: TRANSVAGINAL OB ULTRASOUND TECHNIQUE: Transvaginal ultrasound was performed for complete evaluation of the gestation as well as the maternal uterus, adnexal regions, and pelvic cul-de-sac. COMPARISON:  09/14/2015 FINDINGS: Intrauterine gestational sac: A single intrauterine gestational sac is identified. Yolk sac:  Yolk sac is present. Embryo:  Fetal pole is not identified. Cardiac Activity: Not identified. MSD: 9.3  Mm   5 w   5  d Subchorionic hemorrhage:  None visualized. Maternal uterus/adnexae: No myometrial mass lesions identified. Both ovaries are visualized and have normal appearance. Corpus luteal cyst on the left ovary. Small amount of free fluid in the pelvis. IMPRESSION: Probable early intrauterine gestational sac with yolk sac, but no fetal pole or cardiac activity yet visualized. Recommend follow-up quantitative B-HCG levels and follow-up US in 14 days to confirm and assess viability. This recommendation follows SRU consensus guidelines: Diagnostic Criteria for Nonviable Pregnancy Early in the First Trimester. Malva Limes Med 2013; 161:0960-45. Electronically Signed   By: Burman Nieves M.D.   On: 09/23/2015 22:20     MAU Course  Procedures  MDM Patient reports good results from enema, and pain is improving.   Assessment and Plan   1. Constipation, unspecified constipation type   2. Abdominal pain in pregnancy    DC home Comfort measures reviewed  1stTrimester precautions  Bleeding precautions RX: colace BID #[id number]  Return to MAU as needed FU with OB as planned  Follow-up Information    Schedule an appointment as soon as possible for a visit with [facility].   Contact information:   [address] [place]  [address] [phone]         Tawnya Crook 09/23/2015, 8:28 PM

## 2015-09-23 NOTE — MAU Note (Signed)
Pt reports pain her back, lower abd. and vagina. No change in vaginal discharge.

## 2015-09-23 NOTE — Discharge Instructions (Signed)
Prenatal Care Providers °[facility]    [facility] ° Phone- [phone]     Phone: [phone] °         °[facility]                      [facility] ° @[place]     Phone: [phone] ° Phone: [phone] °        [facility] °[facility]     Phone: [phone] ° Phone: [phone]   °        [facility] °[facility] [place]                hone: [phone] ° Phone: [phone] °        [facility] °Dr. Bernard Marshall      Phone: [phone] ° Phone: [phone] °        [facility] °[facility]                Phone: [phone] ° Women’s Health  ° Phone:[phone]    [facility] ([place]) °         Phone: [phone] °[facility] °  Phone: [phone] °Safe Medications in Pregnancy  ° °Acne: °Benzoyl Peroxide °Salicylic Acid ° °Backache/Headache: °Tylenol: 2 regular strength every 4 hours OR °             2 Extra strength every 6 hours ° °Colds/Coughs/Allergies: °Benadryl (alcohol free) 25 mg every 6 hours as needed °Breath right strips °Claritin °Cepacol throat lozenges °Chloraseptic throat spray °Cold-Eeze- up to three times per day °Cough drops, alcohol free °Flonase (by prescription only) °Guaifenesin °Mucinex °Robitussin DM (plain only, alcohol free) °Saline nasal spray/drops °Sudafed (pseudoephedrine) & Actifed ** use only after [redacted] weeks gestation and if you do not have high blood pressure °Tylenol °Vicks Vaporub °Zinc lozenges °Zyrtec  ° °Constipation: °Colace °Ducolax suppositories °Fleet enema °Glycerin suppositories °Metamucil °Milk of magnesia °Miralax °Senokot °Smooth move tea ° °Diarrhea: °Kaopectate °Imodium A-D ° °*NO pepto Bismol ° °Hemorrhoids: °Anusol °Anusol HC °Preparation H °Tucks ° °Indigestion: °Tums °Maalox °Mylanta °Zantac  °Pepcid ° °Insomnia: °Benadryl (alcohol free) 25mg every 6 hours as needed °Tylenol  PM °Unisom, no Gelcaps ° °Leg Cramps: °Tums °MagGel ° °Nausea/Vomiting:  °Bonine °Dramamine °Emetrol °Ginger extract °Sea bands °Meclizine  °Nausea medication to take during pregnancy:  °Unisom (doxylamine succinate 25 mg tablets) Take one tablet daily at bedtime. If symptoms are not adequately controlled, the dose can be increased to a maximum recommended dose of two tablets daily (1/2 tablet in the morning, 1/2 tablet mid-afternoon and one at bedtime). °Vitamin B6 100mg tablets. Take one tablet twice a day (up to 200 mg per day). ° °Skin Rashes: °Aveeno products °Benadryl cream or 25mg every 6 hours as needed °Calamine Lotion °1% cortisone cream ° °Yeast infection: °Gyne-lotrimin 7 °Monistat 7 ° ° °**If taking multiple medications, please check labels to avoid duplicating the same active ingredients °**take medication as directed on the label °** Do not exceed 4000 mg of tylenol in 24 hours °**Do not take medications that contain aspirin or ibuprofen ° ° ° ° °First Trimester of Pregnancy °The first trimester of pregnancy is from week 1 until the end of week 12 (months 1 through 3). A week after a sperm fertilizes an egg, the egg will implant on the wall of the uterus. This   embryo will begin to develop into a baby. Genes from you and your partner are forming the baby. The female genes determine whether the baby is a boy or a girl. At 6-8 weeks, the eyes and face are formed, and the heartbeat can be seen on ultrasound. At the end of 12 weeks, all the baby's organs are formed.  °Now that you are pregnant, you will want to do everything you can to have a healthy baby. Two of the most important things are to get good prenatal care and to follow your health care provider's instructions. Prenatal care is all the medical care you receive before the baby's birth. This care will help prevent, find, and treat any problems during the pregnancy and childbirth. °BODY CHANGES °Your body goes through many changes during pregnancy.  The changes vary from woman to woman.  °· You may gain or lose a couple of pounds at first. °· You may feel sick to your stomach (nauseous) and throw up (vomit). If the vomiting is uncontrollable, call your health care provider. °· You may tire easily. °· You may develop headaches that can be relieved by medicines approved by your health care provider. °· You may urinate more often. Painful urination may mean you have a bladder infection. °· You may develop heartburn as a result of your pregnancy. °· You may develop constipation because certain hormones are causing the muscles that push waste through your intestines to slow down. °· You may develop hemorrhoids or swollen, bulging veins (varicose veins). °· Your breasts may begin to grow larger and become tender. Your nipples may stick out more, and the tissue that surrounds them (areola) may become darker. °· Your gums may bleed and may be sensitive to brushing and flossing. °· Dark spots or blotches (chloasma, mask of pregnancy) may develop on your face. This will likely fade after the baby is born. °· Your menstrual periods will stop. °· You may have a loss of appetite. °· You may develop cravings for certain kinds of food. °· You may have changes in your emotions from day to day, such as being excited to be pregnant or being concerned that something may go wrong with the pregnancy and baby. °· You may have more vivid and strange dreams. °· You may have changes in your hair. These can include thickening of your hair, rapid growth, and changes in texture. Some women also have hair loss during or after pregnancy, or hair that feels dry or thin. Your hair will most likely return to normal after your baby is born. °WHAT TO EXPECT AT YOUR PRENATAL VISITS °During a routine prenatal visit: °· You will be weighed to make sure you and the baby are growing normally. °· Your blood pressure will be taken. °· Your abdomen will be measured to track your baby's growth. °· The  fetal heartbeat will be listened to starting around week 10 or 12 of your pregnancy. °· Test results from any previous visits will be discussed. °Your health care provider may ask you: °· How you are feeling. °· If you are feeling the baby move. °· If you have had any abnormal symptoms, such as leaking fluid, bleeding, severe headaches, or abdominal cramping. °· If you are using any tobacco products, including cigarettes, chewing tobacco, and electronic cigarettes. °· If you have any questions. °Other tests that may be performed during your first trimester include: °· Blood tests to find your blood type and to check for the presence of any previous   infections. They will also be used to check for low iron levels (anemia) and Rh antibodies. Later in the pregnancy, blood tests for diabetes will be done along with other tests if problems develop. °· Urine tests to check for infections, diabetes, or protein in the urine. °· An ultrasound to confirm the proper growth and development of the baby. °· An amniocentesis to check for possible genetic problems. °· Fetal screens for spina bifida and Down syndrome. °· You may need other tests to make sure you and the baby are doing well. °· HIV (human immunodeficiency virus) testing. Routine prenatal testing includes screening for HIV, unless you choose not to have this test. °HOME CARE INSTRUCTIONS  °Medicines °· Follow your health care provider's instructions regarding medicine use. Specific medicines may be either safe or unsafe to take during pregnancy. °· Take your prenatal vitamins as directed. °· If you develop constipation, try taking a stool softener if your health care provider approves. °Diet °· Eat regular, well-balanced meals. Choose a variety of foods, such as meat or vegetable-based protein, fish, milk and low-fat dairy products, vegetables, fruits, and whole grain breads and cereals. Your health care provider will help you determine the amount of weight gain that  is right for you. °· Avoid raw meat and uncooked cheese. These carry germs that can cause birth defects in the baby. °· Eating four or five small meals rather than three large meals a day may help relieve nausea and vomiting. If you start to feel nauseous, eating a few soda crackers can be helpful. Drinking liquids between meals instead of during meals also seems to help nausea and vomiting. °· If you develop constipation, eat more high-fiber foods, such as fresh vegetables or fruit and whole grains. Drink enough fluids to keep your urine clear or pale yellow. °Activity and Exercise °· Exercise only as directed by your health care provider. Exercising will help you: °¨ Control your weight. °¨ Stay in shape. °¨ Be prepared for labor and delivery. °· Experiencing pain or cramping in the lower abdomen or low back is a good sign that you should stop exercising. Check with your health care provider before continuing normal exercises. °· Try to avoid standing for long periods of time. Move your legs often if you must stand in one place for a long time. °· Avoid heavy lifting. °· Wear low-heeled shoes, and practice good posture. °· You may continue to have sex unless your health care provider directs you otherwise. °Relief of Pain or Discomfort °· Wear a good support bra for breast tenderness.   °· Take warm sitz baths to soothe any pain or discomfort caused by hemorrhoids. Use hemorrhoid cream if your health care provider approves.   °· Rest with your legs elevated if you have leg cramps or low back pain. °· If you develop varicose veins in your legs, wear support hose. Elevate your feet for 15 minutes, 3-4 times a day. Limit salt in your diet. °Prenatal Care °· Schedule your prenatal visits by the twelfth week of pregnancy. They are usually scheduled monthly at first, then more often in the last 2 months before delivery. °· Write down your questions. Take them to your prenatal visits. °· Keep all your prenatal visits as  directed by your health care provider. °Safety °· Wear your seat belt at all times when driving. °· Make a list of emergency phone numbers, including numbers for family, friends, the hospital, and police and fire departments. °General Tips °· Ask your health care   provider for a referral to a local prenatal education class. Begin classes no later than at the beginning of month 6 of your pregnancy. °· Ask for help if you have counseling or nutritional needs during pregnancy. Your health care provider can offer advice or refer you to specialists for help with various needs. °· Do not use hot tubs, steam rooms, or saunas. °· Do not douche or use tampons or scented sanitary pads. °· Do not cross your legs for long periods of time. °· Avoid cat litter boxes and soil used by cats. These carry germs that can cause birth defects in the baby and possibly loss of the fetus by miscarriage or stillbirth. °· Avoid all smoking, herbs, alcohol, and medicines not prescribed by your health care provider. Chemicals in these affect the formation and growth of the baby. °· Do not use any tobacco products, including cigarettes, chewing tobacco, and electronic cigarettes. If you need help quitting, ask your health care provider. You may receive counseling support and other resources to help you quit. °· Schedule a dentist appointment. At home, brush your teeth with a soft toothbrush and be gentle when you floss. °SEEK MEDICAL CARE IF:  °· You have dizziness. °· You have mild pelvic cramps, pelvic pressure, or nagging pain in the abdominal area. °· You have persistent nausea, vomiting, or diarrhea. °· You have a bad smelling vaginal discharge. °· You have pain with urination. °· You notice increased swelling in your face, hands, legs, or ankles. °SEEK IMMEDIATE MEDICAL CARE IF:  °· You have a fever. °· You are leaking fluid from your vagina. °· You have spotting or bleeding from your vagina. °· You have severe abdominal cramping or  pain. °· You have rapid weight gain or loss. °· You vomit blood or material that looks like coffee grounds. °· You are exposed to German measles and have never had them. °· You are exposed to fifth disease or chickenpox. °· You develop a severe headache. °· You have shortness of breath. °· You have any kind of trauma, such as from a fall or a car accident. °  °This information is not intended to replace advice given to you by your health care provider. Make sure you discuss any questions you have with your health care provider. °  °Document Released: 06/28/2001 Document Revised: 07/25/2014 Document Reviewed: 05/14/2013 °Elsevier Interactive Patient Education ©2016 [employer] ° °

## 2015-09-23 NOTE — Progress Notes (Signed)
History   CSN: [id number]  Arrival date and time: 09/23/15 4098  First Provider Initiated Contact with Patient 09/23/15 2034      Chief Complaint  Patient presents with  . Back Pain  . Abdominal Pain  . Vaginal Pain   HPI  Brittany [name] is a 35 yo AA [id number] F at [redacted]w[redacted]d presenting today with lower back pain, abdominal pain, and vaginal pain. The patient states that her pain in these areas has been "on and off for weeks," for which she has been seen here at [facility] twice in the past two weeks, but her current symptoms started last night. She describes her lower back pain as over the spine, bony in nature and not muscular. She points to her lower abdominal area bilaterally as to where the pain is there. The patient describes her vaginal pain as "inside my vagina, towards the top of it." The patient denies recent trauma or inciting incidents to bring on the pain. The patient rates her pain at its best as 9/10, and 10/10 at its worst. She has been treating her symptoms with Tylenol (patient does not remember dose), and states her last dose was at 8 am this morning.The patient also endorses a prolonged history of constipation over the past few weeks, and states that her last bowel movement was two days ago, although she continues to try going to the bathroom. The patient denies vaginal bleeding, discharge, or odor, in addition to denying urinary frequency, urgency, incontinence, and blood in urine. The patient denies nausea, vomiting, diarrhea, chest pain, SOB, palpitations, recent illness, or fever. The patient works at [employer] and endorses contact with sick coworkers. The patient is compliant with her prenatal vitamin regimen, but is not strict with her prenatal care, for which she uses the [facility]. She states she is currently looking for OBGYN care.  OB History    Gravida Para Term Preterm AB TAB SAB Ectopic Multiple Living   0 0 2      Past Medical History  Diagnosis Date  . Headache(784.0)    . Hypertension   . Pregnancy induced hypertension     Past Surgical History  Procedure Laterality Date  . Multiple abortions    . Leep      Family History  Problem Relation Age of Onset  . Asthma Mother   . Hypertension Mother   . Asthma Maternal Grandmother     Social History  Substance Use Topics  . Smoking status: Never Smoker   . Smokeless tobacco: Never Used  . Alcohol Use: No    Allergies: No Known Allergies  Prescriptions prior to admission  Medication Sig Dispense Refill Last Dose  . acetaminophen (TYLENOL) 500 MG tablet Take 1,000 mg by mouth every 6 (six) hours as needed for moderate pain.   09/23/2015 at Unknown time  . Multiple Vitamins-Minerals (MULTIVITAMIN PO) Take 1 tablet by mouth daily.   09/23/2015 at Unknown time    Review of Systems  Constitutional: Negative for fever, chills, malaise/fatigue and diaphoresis.  HENT: Negative for congestion, nosebleeds and sore throat.   Eyes: Negative for blurred vision and double vision.  Respiratory: Negative for cough, shortness of breath and wheezing.   Cardiovascular: Negative for chest pain, palpitations, claudication and leg swelling.  Gastrointestinal: Positive for abdominal pain and constipation. Negative for heartburn, nausea, vomiting, diarrhea and blood in stool.  Genitourinary: Negative for dysuria, urgency, frequency, hematuria and flank pain.  Musculoskeletal: Positive for back pain.  Negative for myalgias and neck pain.  Skin: Negative for itching and rash.  Neurological: Negative for dizziness, tingling, focal weakness, loss of consciousness and headaches.  Endo/Heme/Allergies: Does not bruise/bleed easily.   Physical Exam   Blood pressure 129/71, pulse 98, temperature 98.4 F (36.9 C), temperature source Oral, resp. rate 18, height 5\' 5"  (1.651 m), weight 83.099 kg (183 lb 3.2 oz), last menstrual period 08/17/2015, SpO2 100 %.  Physical Exam  Constitutional: She is oriented to person, place, and  time. She appears well-developed and well-nourished. No distress.  HENT:  Head: Normocephalic and atraumatic.  Neck: Normal range of motion. Neck supple. No thyromegaly present.  Cardiovascular: Normal rate, regular rhythm, S1 normal, S2 normal, normal heart sounds and intact distal pulses.  Exam reveals no gallop and no friction rub.   No murmur heard. Pulses:      Radial pulses are 2+ on the right side, and 2+ on the left side.       Dorsalis pedis pulses are 2+ on the right side, and 2+ on the left side.       Posterior tibial pulses are 2+ on the right side, and 2+ on the left side.  Respiratory: Effort normal and breath sounds normal. No respiratory distress. She has no wheezes. She has no rales. She exhibits no tenderness.  GI: Soft. Bowel sounds are normal. She exhibits no distension and no mass. There is tenderness in the right lower quadrant, suprapubic area and left lower quadrant. There is no rigidity, no rebound, no guarding, no CVA tenderness and negative Murphy's sign.  Moderate tenderness to palpation  Musculoskeletal: Normal range of motion. She exhibits tenderness. She exhibits no edema.       Cervical back: Normal.       Thoracic back: Normal.       Lumbar back: She exhibits tenderness and bony tenderness. She exhibits normal range of motion, no swelling, no edema, no deformity and no laceration.       Back:  Lymphadenopathy:    She has no cervical adenopathy.  Neurological: She is alert and oriented to person, place, and time.  Skin: Skin is warm. No rash noted. No erythema. No pallor.  Psychiatric: She has a normal mood and affect. Her behavior is normal.    [facility] Course  Procedures N/A  Assessment and Plan  Constipation 1. Discussed enema with patient. Reviewed protocol and side effects. Patient is agreeable to plan and demonstrates understanding. 2. Attain ultrasound to assess pregnancy stage and health. 3. Work with patient to establish prenatal care at a local  OB/GYN. 4. Review signs and symptoms of emergent abdominal pain and pregnancy using teach-back. Ensure patient understanding. 5. F/U with PCP 6. Call or visit in the interim with questions or concerns.  Cephus Shellingndrew P Ashlee Player 09/23/2015, 8:38 PM

## 2015-09-28 ENCOUNTER — Encounter (HOSPITAL_COMMUNITY): Payer: Self-pay | Admitting: *Deleted

## 2015-09-28 ENCOUNTER — Inpatient Hospital Stay (HOSPITAL_COMMUNITY)
Admission: AD | Admit: 2015-09-28 | Discharge: 2015-09-28 | Disposition: A | Discharge status: Home / Self Care | Payer: Medicaid Other | Source: Ambulatory Visit | Attending: Family Medicine | Admitting: Family Medicine

## 2015-09-28 DIAGNOSIS — O26891 Other specified pregnancy related conditions, first trimester: Secondary | ICD-10-CM | POA: Insufficient documentation

## 2015-09-28 DIAGNOSIS — R519 Headache, unspecified: Secondary | ICD-10-CM

## 2015-09-28 DIAGNOSIS — A084 Viral intestinal infection, unspecified: Secondary | ICD-10-CM | POA: Diagnosis not present

## 2015-09-28 DIAGNOSIS — Z3A01 Less than 8 weeks gestation of pregnancy: Secondary | ICD-10-CM | POA: Insufficient documentation

## 2015-09-28 DIAGNOSIS — R51 Headache: Secondary | ICD-10-CM | POA: Diagnosis not present

## 2015-09-28 DIAGNOSIS — R102 Pelvic and perineal pain: Secondary | ICD-10-CM | POA: Diagnosis not present

## 2015-09-28 DIAGNOSIS — R112 Nausea with vomiting, unspecified: Secondary | ICD-10-CM | POA: Insufficient documentation

## 2015-09-28 DIAGNOSIS — O9989 Other specified diseases and conditions complicating pregnancy, childbirth and the puerperium: Secondary | ICD-10-CM | POA: Diagnosis not present

## 2015-09-28 LAB — URINALYSIS, ROUTINE W REFLEX MICROSCOPIC
BILIRUBIN URINE: NEGATIVE
Glucose, UA: NEGATIVE mg/dL
KETONES UR: 15 mg/dL — AB
NITRITE: NEGATIVE
PH: 5.5 (ref 5.0–8.0)
Protein, ur: NEGATIVE mg/dL

## 2015-09-28 LAB — CBC WITH DIFFERENTIAL/PLATELET
Basophils Absolute: 0 10*3/uL (ref 0.0–0.1)
Basophils Relative: 0 %
EOS ABS: 0.2 10*3/uL (ref 0.0–0.7)
Eosinophils Relative: 1 %
HCT: 32.8 % — ABNORMAL LOW (ref 36.0–46.0)
HEMOGLOBIN: 11 g/dL — AB (ref 12.0–15.0)
LYMPHS ABS: 2.3 10*3/uL (ref 0.7–4.0)
Lymphocytes Relative: 22 %
MCH: 28 pg (ref 26.0–34.0)
MCHC: 33.5 g/dL (ref 30.0–36.0)
MCV: 83.5 fL (ref 78.0–100.0)
Monocytes Absolute: 0.5 10*3/uL (ref 0.1–1.0)
Monocytes Relative: 4 %
NEUTROS PCT: 73 %
Neutro Abs: 7.5 10*3/uL (ref 1.7–7.7)
Platelets: 268 10*3/uL (ref 150–400)
RBC: 3.93 MIL/uL (ref 3.87–5.11)
RDW: 13.7 % (ref 11.5–15.5)
WBC: 10.4 10*3/uL (ref 4.0–10.5)

## 2015-09-28 LAB — COMPREHENSIVE METABOLIC PANEL
ALK PHOS: 43 U/L (ref 38–126)
ALT: 21 U/L (ref 14–54)
ANION GAP: 10 (ref 5–15)
AST: 24 U/L (ref 15–41)
Albumin: 4.2 g/dL (ref 3.5–5.0)
BILIRUBIN TOTAL: 0.4 mg/dL (ref 0.3–1.2)
BUN: 13 mg/dL (ref 6–20)
CALCIUM: 9.1 mg/dL (ref 8.9–10.3)
CO2: 24 mmol/L (ref 22–32)
CREATININE: 0.76 mg/dL (ref 0.44–1.00)
Chloride: 101 mmol/L (ref 101–111)
Glucose, Bld: 86 mg/dL (ref 65–99)
Potassium: 3.4 mmol/L — ABNORMAL LOW (ref 3.5–5.1)
Sodium: 135 mmol/L (ref 135–145)
TOTAL PROTEIN: 8.2 g/dL — AB (ref 6.5–8.1)

## 2015-09-28 LAB — URINE MICROSCOPIC-ADD ON

## 2015-09-28 LAB — INFLUENZA PANEL BY PCR (TYPE A & B)
H1N1FLUPCR: NOT DETECTED
INFLAPCR: NEGATIVE
INFLBPCR: NEGATIVE

## 2015-09-28 MED ORDER — SODIUM CHLORIDE 0.9 % IV SOLN
INTRAVENOUS | Status: DC
  Administered 2015-09-28: 21:00:00 via INTRAVENOUS

## 2015-09-28 MED ORDER — DIPHENHYDRAMINE HCL 50 MG/ML IJ SOLN
25.0000 mg | Freq: Once | INTRAMUSCULAR | Status: AC
  Administered 2015-09-28: 25 mg via INTRAVENOUS
  Filled 2015-09-28: qty 1

## 2015-09-28 MED ORDER — METOCLOPRAMIDE HCL 5 MG/ML IJ SOLN
10.0000 mg | Freq: Once | INTRAMUSCULAR | Status: AC
  Administered 2015-09-28: 10 mg via INTRAVENOUS
  Filled 2015-09-28: qty 2

## 2015-09-28 MED ORDER — SODIUM CHLORIDE 0.9 % IV SOLN
INTRAVENOUS | Status: DC
  Administered 2015-09-28: 22:00:00 via INTRAVENOUS

## 2015-09-28 MED ORDER — PROMETHAZINE HCL 25 MG/ML IJ SOLN
25.0000 mg | Freq: Once | INTRAMUSCULAR | Status: AC
  Administered 2015-09-28: 25 mg via INTRAVENOUS
  Filled 2015-09-28: qty 1

## 2015-09-28 MED ORDER — DEXAMETHASONE SODIUM PHOSPHATE 10 MG/ML IJ SOLN
10.0000 mg | Freq: Once | INTRAMUSCULAR | Status: AC
  Administered 2015-09-28: 10 mg via INTRAVENOUS
  Filled 2015-09-28: qty 1

## 2015-09-28 MED ORDER — PROMETHAZINE HCL 12.5 MG PO TABS: 12.5000 mg | ORAL_TABLET | Freq: Four times a day (QID) | ORAL | Status: DC | PRN

## 2015-09-28 NOTE — Discharge Instructions (Signed)
Food Choices to Help Relieve Diarrhea, Adult °When you have diarrhea, the foods you eat and your eating habits are very important. Choosing the right foods and drinks can help relieve diarrhea. Also, because diarrhea can last up to 7 days, you need to replace lost fluids and electrolytes (such as sodium, potassium, and chloride) in order to help prevent dehydration.  °WHAT GENERAL GUIDELINES DO I NEED TO FOLLOW? °· Slowly drink 1 cup (8 oz) of fluid for each episode of diarrhea. If you are getting enough fluid, your urine will be clear or pale yellow. °· Eat starchy foods. Some good choices include white rice, white toast, pasta, low-fiber cereal, baked potatoes (without the skin), saltine crackers, and bagels. °· Avoid large servings of any cooked vegetables. °· Limit fruit to two servings per day. A serving is ½ cup or 1 small piece. °· Choose foods with less than 2 g of fiber per serving. °· Limit fats to less than 8 tsp (38 g) per day. °· Avoid fried foods. °· Eat foods that have probiotics in them. Probiotics can be found in certain dairy products. °· Avoid foods and beverages that may increase the speed at which food moves through the stomach and intestines (gastrointestinal tract). Things to avoid include: °· High-fiber foods, such as dried fruit, raw fruits and vegetables, nuts, seeds, and whole grain foods. °· Spicy foods and high-fat foods. °· Foods and beverages sweetened with high-fructose corn syrup, honey, or sugar alcohols such as xylitol, sorbitol, and mannitol. °WHAT FOODS ARE RECOMMENDED? °Grains °White rice. White, French, or pita breads (fresh or toasted), including plain rolls, buns, or bagels. White pasta. Saltine, soda, or graham crackers. Pretzels. Low-fiber cereal. Cooked cereals made with water (such as cornmeal, farina, or cream cereals). Plain muffins. Matzo. Melba toast. Zwieback.  °Vegetables °Potatoes (without the skin). Strained tomato and vegetable juices. Most well-cooked and canned  vegetables without seeds. Tender lettuce. °Fruits °Cooked or canned applesauce, apricots, cherries, fruit cocktail, grapefruit, peaches, pears, or plums. Fresh bananas, apples without skin, cherries, grapes, cantaloupe, grapefruit, peaches, oranges, or plums.  °Meat and Other Protein Products °Baked or boiled chicken. Eggs. Tofu. Fish. Seafood. Smooth peanut butter. Ground or well-cooked tender beef, ham, veal, lamb, pork, or poultry.  °Dairy °Plain yogurt, kefir, and unsweetened liquid yogurt. Lactose-free milk, buttermilk, or soy milk. Plain hard cheese. °Beverages °Sport drinks. Clear broths. Diluted fruit juices (except prune). Regular, caffeine-free sodas such as ginger ale. Water. Decaffeinated teas. Oral rehydration solutions. Sugar-free beverages not sweetened with sugar alcohols. °Other °Bouillon, broth, or soups made from recommended foods.  °The items listed above may not be a complete list of recommended foods or beverages. Contact your dietitian for more options. °WHAT FOODS ARE NOT RECOMMENDED? °Grains °Whole grain, whole wheat, bran, or rye breads, rolls, pastas, crackers, and cereals. Wild or brown rice. Cereals that contain more than 2 g of fiber per serving. Corn tortillas or taco shells. Cooked or dry oatmeal. Granola. Popcorn. °Vegetables °Raw vegetables. Cabbage, broccoli, Brussels sprouts, artichokes, baked beans, beet greens, corn, kale, legumes, peas, sweet potatoes, and yams. Potato skins. Cooked spinach and cabbage. °Fruits °Dried fruit, including raisins and dates. Raw fruits. Stewed or dried prunes. Fresh apples with skin, apricots, mangoes, pears, raspberries, and strawberries.  °Meat and Other Protein Products °Chunky peanut butter. Nuts and seeds. Beans and lentils. Bacon.  °Dairy °High-fat cheeses. Milk, chocolate milk, and beverages made with milk, such as milk shakes. Cream. Ice cream. °Sweets and Desserts °Sweet rolls, doughnuts, and sweet breads.   Pancakes and waffles. Fats and  Oils Butter. Cream sauces. Margarine. Salad oils. Plain salad dressings. Olives. Avocados.  Beverages Caffeinated beverages (such as coffee, tea, soda, or energy drinks). Alcoholic beverages. Fruit juices with pulp. Prune juice. Soft drinks sweetened with high-fructose corn syrup or sugar alcohols. Other Coconut. Hot sauce. Chili powder. Mayonnaise. Gravy. Cream-based or milk-based soups.  The items listed above may not be a complete list of foods and beverages to avoid. Contact your dietitian for more information. WHAT SHOULD I DO IF I BECOME DEHYDRATED? Diarrhea can sometimes lead to dehydration. Signs of dehydration include dark urine and dry mouth and skin. If you think you are dehydrated, you should rehydrate with an oral rehydration solution. These solutions can be purchased at pharmacies, retail stores, or online.  Drink -1 cup (120-240 mL) of oral rehydration solution each time you have an episode of diarrhea. If drinking this amount makes your diarrhea worse, try drinking smaller amounts more often. For example, drink 1-3 tsp (5-15 mL) every 5-10 minutes.  A general rule for staying hydrated is to drink 1-2 L of fluid per day. Talk to your health care provider about the specific amount you should be drinking each day. Drink enough fluids to keep your urine clear or pale yellow.   This information is not intended to replace advice given to you by your health care provider. Make sure you discuss any questions you have with your health care provider.   Document Released: 09/24/2003 Document Revised: 07/25/2014 Document Reviewed: 05/27/2013 Elsevier Interactive Patient Education 2016 Elsevier Inc. Eating Plan for N/V Severe cases of hyperemesis gravidarum can lead to dehydration and malnutrition. The hyperemesis eating plan is one way to lessen the symptoms of nausea and vomiting. It is often used with prescribed medicines to control your symptoms.  WHAT CAN I DO TO RELIEVE MY  SYMPTOMS? Listen to your body. Everyone is different and has different preferences. Find what works best for you. Some of the following things may help:  Eat and drink slowly.  Eat 5-6 small meals daily instead of 3 large meals.   Eat crackers before you get out of bed in the morning.   Starchy foods are usually well tolerated (such as cereal, toast, bread, potatoes, pasta, rice, and pretzels).   Ginger may help with nausea. Add  tsp ground ginger to hot tea or choose ginger tea.   Try drinking 100% fruit juice or an electrolyte drink.  Continue to take your prenatal vitamins as directed by your health care provider. If you are having trouble taking your prenatal vitamins, talk with your health care provider about different options.  Include at least 1 serving of protein with your meals and snacks (such as meats or poultry, beans, nuts, eggs, or yogurt). Try eating a protein-rich snack before bed (such as cheese and crackers or a half [place] or peanut butter sandwich). WHAT THINGS SHOULD I AVOID TO REDUCE MY SYMPTOMS? The following things may help reduce your symptoms:  Avoid foods with strong smells. Try eating meals in well-ventilated areas that are free of odors.  Avoid drinking water or other beverages with meals. Try not to drink anything less than 30 minutes before and after meals.  Avoid drinking more than 1 cup of fluid at a time.  Avoid fried or high-fat foods, such as butter and cream sauces.  Avoid spicy foods.  Avoid skipping meals the best you can. Nausea can be more intense on an empty stomach. If you cannot tolerate food  at that time, do not force it. Try sucking on ice chips or other frozen items and make up the calories later.  Avoid lying down within 2 hours after eating.   This information is not intended to replace advice given to you by your health care provider. Make sure you discuss any questions you have with your health care provider.   Document  Released: 05/01/2007 Document Revised: 07/09/2013 Document Reviewed: 05/08/2013 Elsevier Interactive Patient Education [employer]2016 Elsevier Inc.

## 2015-09-28 NOTE — MAU Provider Note (Signed)
Chief Complaint: No chief complaint on file.   First Provider Initiated Contact with Patient 09/28/15 1818        SUBJECTIVE  Brittany [name] is a 35 y.o. [id number] at [redacted]w[redacted]d by LMP who presents to maternity admissions reporting body aches, headache and nausea since last night. Has not tried anything for these symptoms.  Was at work so could not take temperature. She denies vaginal bleeding, vaginal itching/burning, urinary symptoms, dizziness, or fever/chills.    Headache  This is a new problem. The current episode started today. The problem occurs constantly. The problem has been unchanged. The pain is located in the bilateral and frontal region. The pain does not radiate. The quality of the pain is described as aching and dull. The pain is moderate. Associated symptoms include nausea and weakness. Pertinent negatives include no abdominal pain, back pain, blurred vision, coughing, dizziness, photophobia, sinus pressure or vomiting. Associated symptoms comments: Aches . The symptoms are aggravated by activity. She has tried nothing for the symptoms.  Emesis  This is a new problem. The current episode started yesterday. The problem occurs less than 2 times per day. The problem has been unchanged. There has been no fever. Associated symptoms include chills, headaches and myalgias. Pertinent negatives include no abdominal pain, coughing, diarrhea or dizziness. She has tried nothing for the symptoms.    RN Note: Pt presents to MAU with complaints of body aches since last night. Reports headache, nausea . Denies any vaginal bleeding or abnormal discharge         Past Medical History  Diagnosis Date  . Headache(784.0)   . Hypertension   . Pregnancy induced hypertension    Past Surgical History  Procedure Laterality Date  . Multiple abortions    . Leep     Social History   Social History  . Marital Status: Married    Spouse Name: N/A  . Number of Children: N/A  . Years of Education:  N/A   Occupational History  . Not on file.   Social History Main Topics  . Smoking status: Never Smoker   . Smokeless tobacco: Never Used  . Alcohol Use: No  . Drug Use: No  . Sexual Activity: Yes     Comment: last intercourse 3 days ago.    Other Topics Concern  . Not on file   Social History Narrative   No current facility-administered medications on file prior to encounter.   Current Outpatient Prescriptions on File Prior to Encounter  Medication Sig Dispense Refill  . acetaminophen (TYLENOL) 500 MG tablet Take 1,000 mg by mouth every 6 (six) hours as needed for moderate pain.    Marland Kitchen docusate sodium (COLACE) 100 MG capsule Take 1 capsule (100 mg total) by mouth every 12 (twelve) hours. 60 capsule 0   No Known Allergies  I have reviewed patient's Past Medical Hx, Surgical Hx, Family Hx, Social Hx, medications and allergies.   ROS:  Review of Systems  Constitutional: Positive for chills and fatigue.  HENT: Negative for congestion and sinus pressure.   Eyes: Negative for blurred vision and photophobia.  Respiratory: Negative for cough and shortness of breath.   Gastrointestinal: Positive for nausea. Negative for vomiting, abdominal pain, diarrhea and constipation.  Endocrine: Positive for cold intolerance.  Genitourinary: Negative for dysuria, vaginal bleeding and pelvic pain.  Musculoskeletal: Positive for myalgias. Negative for back pain and neck stiffness.  Neurological: Positive for weakness and headaches. Negative for dizziness, speech difficulty and light-headedness.   Physical  Exam  Patient Vitals for the past 24 hrs:  BP Temp Temp src Pulse SpO2  09/28/15 1813 151/84 mmHg 99.4 F (37.4 C) Axillary 91 100 %   Physical Exam  Constitutional: Well-developed, well-nourished female in no acute distress, but ill-appearing.  Cardiovascular: normal rate and rhythm Respiratory: normal effort, CTAB GI: Abd soft, non-tender. Pos BS x 4 MS: Extremities nontender, no  edema, normal ROM Neurologic: Alert and oriented x 4.  GU: Neg CVAT.  Pelvic exam not indicated   LAB RESULTS     IMAGING US Ob Comp Less 14 Wks  09/14/2015  CLINICAL DATA:  35 year old female with pelvic pain. EXAM: OBSTETRIC <14 WK [place] AND TRANSVAGINAL OB US TECHNIQUE: Both transabdominal and transvaginal ultrasound examinations were performed for complete evaluation of the gestation as well as the maternal uterus, adnexal regions, and pelvic cul-de-sac. Transvaginal technique was performed to assess early pregnancy. COMPARISON:  None. FINDINGS: The uterus is anteverted and appears unremarkable. The endometrium measures up to 9 mm. No intrauterine pregnancy identified. The right ovary measures 2.5 x 1.4 x 2.7 cm and the left ovary measures 2.9 x 1.8 x 2.0 cm. The ovaries appear unremarkable. No significant free fluid within the pelvis. IMPRESSION: No intrauterine pregnancy identified. Please note although no evidence of ectopic pregnancy identified this ultrasound, the possibility of an ectopic pregnancy is not excluded. Correlation with clinical exam and follow-up with serial HCG levels and ultrasound recommended. Electronically Signed   By: Elgie Collard M.D.   On: 09/14/2015 22:24   US Ob Transvaginal  09/23/2015  CLINICAL DATA:  Back pain for 1 week. Vaginal pain since last night. Estimated gestational age by LMP is 5 weeks 2 days. Rising beta HCG, 9 days ago was 190 and 7 days ago 583. No current quantitative beta HCG is given. EXAM: TRANSVAGINAL OB ULTRASOUND TECHNIQUE: Transvaginal ultrasound was performed for complete evaluation of the gestation as well as the maternal uterus, adnexal regions, and pelvic cul-de-sac. COMPARISON:  09/14/2015 FINDINGS: Intrauterine gestational sac: A single intrauterine gestational sac is identified. Yolk sac:  Yolk sac is present. Embryo:  Fetal pole is not identified. Cardiac Activity: Not identified. MSD: 9.3  Mm   5 w   5  d Subchorionic hemorrhage:  None  visualized. Maternal uterus/adnexae: No myometrial mass lesions identified. Both ovaries are visualized and have normal appearance. Corpus luteal cyst on the left ovary. Small amount of free fluid in the pelvis. IMPRESSION: Probable early intrauterine gestational sac with yolk sac, but no fetal pole or cardiac activity yet visualized. Recommend follow-up quantitative B-HCG levels and follow-up US in 14 days to confirm and assess viability. This recommendation follows SRU consensus guidelines: Diagnostic Criteria for Nonviable Pregnancy Early in the First Trimester. Malva Limes Med 2013; 119:1478-29. Electronically Signed   By: Burman Nieves M.D.   On: 09/23/2015 22:20   US Ob Transvaginal  09/14/2015  CLINICAL DATA:  35 year old female with pelvic pain. EXAM: OBSTETRIC <14 WK [place] AND TRANSVAGINAL OB US TECHNIQUE: Both transabdominal and transvaginal ultrasound examinations were performed for complete evaluation of the gestation as well as the maternal uterus, adnexal regions, and pelvic cul-de-sac. Transvaginal technique was performed to assess early pregnancy. COMPARISON:  None. FINDINGS: The uterus is anteverted and appears unremarkable. The endometrium measures up to 9 mm. No intrauterine pregnancy identified. The right ovary measures 2.5 x 1.4 x 2.7 cm and the left ovary measures 2.9 x 1.8 x 2.0 cm. The ovaries appear unremarkable. No significant free fluid within the  pelvis. IMPRESSION: No intrauterine pregnancy identified. Please note although no evidence of ectopic pregnancy identified this ultrasound, the possibility of an ectopic pregnancy is not excluded. Correlation with clinical exam and follow-up with serial HCG levels and ultrasound recommended. Electronically Signed   By: Elgie CollardArash  Radparvar M.D.   On: 09/14/2015 22:24    MAU Management/MDM: Ordered labs and reviewed results.    Treatments in MAU included IV hydration with medication for headache.  Phenergan in IV for nausea and Reglan for  headache..   Felt mostly better with headache after Reglan. Feels somewhat better after first liter of fluids. No urination yet, so will give a second liter.  No emesis.    ASSESSMENT SIUP at [redacted]w[redacted]d  Nausea Headache Malaise/chills  PLAN    Medication List    ASK your doctor about these medications        acetaminophen 500 MG tablet  Commonly known as:  TYLENOL  Take 1,000 mg by mouth every 6 (six) hours as needed for moderate pain.     docusate sodium 100 MG capsule  Commonly known as:  COLACE  Take 1 capsule (100 mg total) by mouth every 12 (twelve) hours.     prenatal multivitamin Tabs tablet  Take 1 tablet by mouth daily at 12 noon.        [facility] Lovinia Snare PAC. Anticipate discharge after fluids infused  Wynelle BourgeoisMarie Williams CNM, MSN Certified Nurse-Midwife 09/28/2015  6:43 PM  2200 - Care assumed from Wynelle BourgeoisMarie Williams, CNM Patient continues to complain of 8/10 headache pain 1 liter IV NS, 25 mg Benadryl, 10 mg Reglan and 10 mg Decadron ordered Patient is resting comfortably prior to discharge. She reports improvement in headache at time of discharge  A: SIUP at 207w0d Viral gastroenteritis Headache   P: Discharge home Rx for Phenergan given to patient  Tylenol PRN for pain First trimester precautions discussed Patient advised to follow-up with OB provider of choice to start prenatal care Patient may return to MAU as needed or if her condition were to change or worsen   Marny LowensteinJulie N Shariya Gaster, PA-C 09/28/2015 10:01 PM

## 2015-09-28 NOTE — MAU Note (Signed)
Pt presents to MAU with complaints of body aches since last night. Reports headache, nausea . Denies any vaginal bleeding or abnormal discharge

## 2015-10-01 NOTE — Telephone Encounter (Signed)
error 

## 2015-10-08 ENCOUNTER — Inpatient Hospital Stay (HOSPITAL_COMMUNITY): Payer: Medicaid Other

## 2015-10-08 ENCOUNTER — Inpatient Hospital Stay (HOSPITAL_COMMUNITY)
Admission: AD | Admit: 2015-10-08 | Discharge: 2015-10-08 | Disposition: A | Discharge status: Home / Self Care | Payer: Medicaid Other | Source: Ambulatory Visit | Attending: Obstetrics and Gynecology | Admitting: Obstetrics and Gynecology

## 2015-10-08 ENCOUNTER — Encounter (HOSPITAL_COMMUNITY): Payer: Self-pay | Admitting: *Deleted

## 2015-10-08 DIAGNOSIS — O26891 Other specified pregnancy related conditions, first trimester: Secondary | ICD-10-CM | POA: Insufficient documentation

## 2015-10-08 DIAGNOSIS — Z3A01 Less than 8 weeks gestation of pregnancy: Secondary | ICD-10-CM | POA: Insufficient documentation

## 2015-10-08 DIAGNOSIS — K5901 Slow transit constipation: Secondary | ICD-10-CM

## 2015-10-08 DIAGNOSIS — O9989 Other specified diseases and conditions complicating pregnancy, childbirth and the puerperium: Secondary | ICD-10-CM

## 2015-10-08 DIAGNOSIS — R1030 Lower abdominal pain, unspecified: Secondary | ICD-10-CM | POA: Diagnosis not present

## 2015-10-08 DIAGNOSIS — R109 Unspecified abdominal pain: Secondary | ICD-10-CM

## 2015-10-08 DIAGNOSIS — O10011 Pre-existing essential hypertension complicating pregnancy, first trimester: Secondary | ICD-10-CM | POA: Diagnosis not present

## 2015-10-08 DIAGNOSIS — K5900 Constipation, unspecified: Secondary | ICD-10-CM | POA: Diagnosis not present

## 2015-10-08 DIAGNOSIS — O26899 Other specified pregnancy related conditions, unspecified trimester: Secondary | ICD-10-CM

## 2015-10-08 LAB — URINALYSIS, ROUTINE W REFLEX MICROSCOPIC
BILIRUBIN URINE: NEGATIVE
Glucose, UA: NEGATIVE mg/dL
Ketones, ur: 15 mg/dL — AB
NITRITE: NEGATIVE
PH: 6 (ref 5.0–8.0)
Protein, ur: NEGATIVE mg/dL
SPECIFIC GRAVITY, URINE: 1.01 (ref 1.005–1.030)

## 2015-10-08 LAB — URINE MICROSCOPIC-ADD ON

## 2015-10-08 MED ORDER — OXYCODONE-ACETAMINOPHEN 5-325 MG PO TABS: 1.0000 | ORAL_TABLET | Freq: Four times a day (QID) | ORAL | Status: DC | PRN

## 2015-10-08 NOTE — MAU Provider Note (Signed)
History     CSN: [id number]  Arrival date and time: 10/08/15 1717   First Provider Initiated Contact with Patient 10/08/15 1746      Chief Complaint  Patient presents with  . Abdominal Cramping   HPI [name] is a 35 y.o. [phone] at [redacted]w[redacted]d who presents to MAU today with complaint of lower abdominal cramping and low back pain. She denies vaginal discharge, bleeding or UTI symptoms. She has had some nausea without vomiting, diarrhea or constipation. She has tried Tylenol for pain, but it is not helping. She rates pain at 8/10 now. It started this morning. She had IUGS and YS on last [place] at [redacted]w[redacted]d and negative infection testing.   OB History    Gravida Para Term Preterm AB TAB SAB Ectopic Multiple Living   0 0 2      Past Medical History  Diagnosis Date  . Headache(784.0)   . Hypertension   . Pregnancy induced hypertension     Past Surgical History  Procedure Laterality Date  . Multiple abortions    . Leep      Family History  Problem Relation Age of Onset  . Asthma Mother   . Hypertension Mother   . Asthma Maternal Grandmother     Social History  Substance Use Topics  . Smoking status: Never Smoker   . Smokeless tobacco: Never Used  . Alcohol Use: No    Allergies: No Known Allergies  Prescriptions prior to admission  Medication Sig Dispense Refill Last Dose  . acetaminophen (TYLENOL) 500 MG tablet Take 1,000 mg by mouth every 6 (six) hours as needed for moderate pain.   10/08/2015 at Unknown time  . docusate sodium (COLACE) 100 MG capsule Take 1 capsule (100 mg total) by mouth every 12 (twelve) hours. 60 capsule 0 Past Week at Unknown time  . Prenatal Vit-Fe Fumarate-FA (PRENATAL MULTIVITAMIN) TABS tablet Take 1 tablet by mouth daily at 12 noon.   10/08/2015 at Unknown time  . promethazine (PHENERGAN) 12.5 MG tablet Take 1 tablet (12.5 mg total) by mouth every 6 (six) hours as needed for nausea or vomiting. 30 tablet 0 10/07/2015 at Unknown  time    Review of Systems  Constitutional: Negative for fever and malaise/fatigue.  Gastrointestinal: Positive for abdominal pain. Negative for nausea, vomiting, diarrhea and constipation.  Genitourinary: Negative for dysuria, urgency and frequency.       Neg - vaginal bleeding, discharge  Musculoskeletal: Positive for back pain.   Physical Exam   Blood pressure 135/77, pulse 90, temperature 97.5 F (36.4 C), temperature source Oral, resp. rate 16, height  (1.651 m), last menstrual period 08/17/2015.  Physical Exam  Nursing note and vitals reviewed. Constitutional: She is oriented to person, place, and time. She appears well-developed and well-nourished. No distress.  HENT:  Head: Normocephalic and atraumatic.  Cardiovascular: Normal rate.   Respiratory: Effort normal.  GI: Soft. Bowel sounds are normal. She exhibits no distension and no mass. There is tenderness (mild diffuse lower abdominal tenderness to palpation). There is no rebound and no guarding.  Genitourinary:  Cervix: closed, thick, poseterior  Neurological: She is alert and oriented to person, place, and time.  Skin: Skin is warm and dry. No erythema.  Psychiatric: She has a normal mood and affect.    Results for orders placed or performed during the hospital encounter of 10/08/15 (from the past 24 hour(s))  Urinalysis, Routine w reflex microscopic (not at  [facility])     Status: Abnormal   Collection Time: 10/08/15  5:25 PM  Result Value Ref Range   Color, Urine YELLOW YELLOW   APPearance CLEAR CLEAR   Specific Gravity, Urine 1.010 1.005 - 1.030   pH 6.0 5.0 - 8.0   Glucose, UA NEGATIVE NEGATIVE mg/dL   Hgb urine dipstick SMALL (A) NEGATIVE   Bilirubin Urine NEGATIVE NEGATIVE   Ketones, ur 15 (A) NEGATIVE mg/dL   Protein, ur NEGATIVE NEGATIVE mg/dL   Nitrite NEGATIVE NEGATIVE   Leukocytes, UA TRACE (A) NEGATIVE  Urine microscopic-add on     Status: Abnormal   Collection Time: 10/08/15  5:25 PM  Result Value  Ref Range   Squamous Epithelial / LPF 0-5 (A) NONE SEEN   WBC, UA 0-5 0 - 5 WBC/hpf   RBC / HPF 0-5 0 - 5 RBC/hpf   Bacteria, UA FEW (A) NONE SEEN     [place] Ob Transvaginal  10/08/2015  CLINICAL DATA:  Pelvic pain for one day, first trimester pregnancy EXAM: TRANSVAGINAL OB ULTRASOUND TECHNIQUE: Transvaginal ultrasound was performed for complete evaluation of the gestation as well as the maternal uterus, adnexal regions, and pelvic cul-de-sac. COMPARISON:  None. FINDINGS: Intrauterine gestational sac: Visualized/normal in shape. Yolk sac:  Present Embryo:  Present Cardiac Activity: Present Heart Rate: 148 bpm CRL:   12  mm   7 w 3 d                  US EDC: 05/23/2016 Subchorionic hemorrhage:  None visualized. Maternal uterus/adnexae: Ovaries are normal.  No free fluid. IMPRESSION: Live intrauterine gestation with no evidence of abnormalities Electronically Signed   By: Esperanza Heiraymond  Rubner M.D.   On: 10/08/2015 18:29     MAU Course  Procedures None  MDM UA and US today  Patient is driving today, unable to have anything but Tylenol in MAU, refused at this time.   Assessment and Plan  A: SIUP at 4668w3d Constipation Abdominal pain in pregnancy, first trimester  P: Discharge home Rx for Percocet #5 given only, advised that this may worsen constipation First trimester precautions discussed Patient advised to follow-up with OB provider of choice to start prenatal care Patient may return to MAU as needed or if her condition were to change or worsen   Marny LowensteinJulie N Wenzel, PA-C  10/08/2015, 6:51 PM

## 2015-10-08 NOTE — Discharge Instructions (Signed)
Constipation, Adult Constipation is when a person has fewer than three bowel movements a week, has difficulty having a bowel movement, or has stools that are dry, hard, or larger than normal. As people grow older, constipation is more common. A low-fiber diet, not taking in enough fluids, and taking certain medicines may make constipation worse.  CAUSES   Certain medicines, such as antidepressants, pain medicine, iron supplements, antacids, and water pills.   Certain diseases, such as diabetes, irritable bowel syndrome (IBS), thyroid disease, or depression.   Not drinking enough water.   Not eating enough fiber-rich foods.   Stress or travel.   Lack of physical activity or exercise.   Ignoring the urge to have a bowel movement.   Using laxatives too much.  SIGNS AND SYMPTOMS   Having fewer than three bowel movements a week.   Straining to have a bowel movement.   Having stools that are hard, dry, or larger than normal.   Feeling full or bloated.   Pain in the lower abdomen.   Not feeling relief after having a bowel movement.  DIAGNOSIS  Your health care provider will take a medical history and perform a physical exam. Further testing may be done for severe constipation. Some tests may include:  A barium enema X-ray to examine your rectum, colon, and, sometimes, your small intestine.   A sigmoidoscopy to examine your lower colon.   A colonoscopy to examine your entire colon. TREATMENT  Treatment will depend on the severity of your constipation and what is causing it. Some dietary treatments include drinking more fluids and eating more fiber-rich foods. Lifestyle treatments may include regular exercise. If these diet and lifestyle recommendations do not help, your health care provider may recommend taking over-the-counter laxative medicines to help you have bowel movements. Prescription medicines may be prescribed if over-the-counter medicines do not work.    HOME CARE INSTRUCTIONS   Eat foods that have a lot of fiber, such as fruits, vegetables, whole grains, and beans.  Limit foods high in fat and processed sugars, such as french fries, hamburgers, cookies, candies, and soda.   A fiber supplement may be added to your diet if you cannot get enough fiber from foods.   Drink enough fluids to keep your urine clear or pale yellow.   Exercise regularly or as directed by your health care provider.   Go to the restroom when you have the urge to go. Do not hold it.   Only take over-the-counter or prescription medicines as directed by your health care provider. Do not take other medicines for constipation without talking to your health care provider first.  SEEK IMMEDIATE MEDICAL CARE IF:   You have bright red blood in your stool.   Your constipation lasts for more than 4 days or gets worse.   You have abdominal or rectal pain.   You have thin, pencil-like stools.   You have unexplained weight loss. MAKE SURE YOU:   Understand these instructions.  Will watch your condition.  Will get help right away if you are not doing well or get worse.   This information is not intended to replace advice given to you by your health care provider. Make sure you discuss any questions you have with your health care provider.   Document Released: 04/01/2004 Document Revised: 07/25/2014 Document Reviewed: 04/15/2013 Elsevier Interactive Patient Education 2016 [employer] First Trimester of Pregnancy The first trimester of pregnancy is from week 1 until the end of week 12 (months  1 through 3). During this time, your baby will begin to develop inside you. At 6-8 weeks, the eyes and face are formed, and the heartbeat can be seen on ultrasound. At the end of 12 weeks, all the baby's organs are formed. Prenatal care is all the medical care you receive before the birth of your baby. Make sure you get good prenatal care and follow all of your doctor's  instructions. HOME CARE  Medicines  Take medicine only as told by your doctor. Some medicines are safe and some are not during pregnancy.  Take your prenatal vitamins as told by your doctor.  Take medicine that helps you poop (stool softener) as needed if your doctor says it is okay. Diet  Eat regular, healthy meals.  Your doctor will tell you the amount of weight gain that is right for you.  Avoid raw meat and uncooked cheese.  If you feel sick to your stomach (nauseous) or throw up (vomit):  Eat 4 or 5 small meals a day instead of 3 large meals.  Try eating a few soda crackers.  Drink liquids between meals instead of during meals.  If you have a hard time pooping (constipation):  Eat high-fiber foods like fresh vegetables, fruit, and whole grains.  Drink enough fluids to keep your pee (urine) clear or pale yellow. Activity and Exercise  Exercise only as told by your doctor. Stop exercising if you have cramps or pain in your lower belly (abdomen) or low back.  Try to avoid standing for long periods of time. Move your legs often if you must stand in one place for a long time.  Avoid heavy lifting.  Wear low-heeled shoes. Sit and stand up straight.  You can have sex unless your doctor tells you not to. Relief of Pain or Discomfort  Wear a good support bra if your breasts are sore.  Take warm water baths (sitz baths) to soothe pain or discomfort caused by hemorrhoids. Use hemorrhoid cream if your doctor says it is okay.  Rest with your legs raised if you have leg cramps or low back pain.  Wear support hose if you have puffy, bulging veins (varicose veins) in your legs. Raise (elevate) your feet for 15 minutes, 3-4 times a day. Limit salt in your diet. Prenatal Care  Schedule your prenatal visits by the twelfth week of pregnancy.  Write down your questions. Take them to your prenatal visits.  Keep all your prenatal visits as told by your doctor. Safety  Wear  your seat belt at all times when driving.  Make a list of emergency phone numbers. The list should include numbers for family, friends, the hospital, and police and fire departments. General Tips  Ask your doctor for a referral to a local prenatal class. Begin classes no later than at the start of month 6 of your pregnancy.  Ask for help if you need counseling or help with nutrition. Your doctor can give you advice or tell you where to go for help.  Do not use hot tubs, steam rooms, or saunas.  Do not douche or use tampons or scented sanitary pads.  Do not cross your legs for long periods of time.  Avoid litter boxes and soil used by cats.  Avoid all smoking, herbs, and alcohol. Avoid drugs not approved by your doctor.  Do not use any tobacco products, including cigarettes, chewing tobacco, and electronic cigarettes. If you need help quitting, ask your doctor. You may get counseling or other  support to help you quit.  Visit your dentist. At home, brush your teeth with a soft toothbrush. Be gentle when you floss. GET HELP IF:  You are dizzy.  You have mild cramps or pressure in your lower belly.  You have a nagging pain in your belly area.  You continue to feel sick to your stomach, throw up, or have watery poop (diarrhea).  You have a bad smelling fluid coming from your vagina.  You have pain with peeing (urination).  You have increased puffiness (swelling) in your face, hands, legs, or ankles. GET HELP RIGHT AWAY IF:   You have a fever.  You are leaking fluid from your vagina.  You have spotting or bleeding from your vagina.  You have very bad belly cramping or pain.  You gain or lose weight rapidly.  You throw up blood. It may look like coffee grounds.  You are around people who have [place] measles, fifth disease, or chickenpox.  You have a very bad headache.  You have shortness of breath.  You have any kind of trauma, such as from a fall or a car  accident.   This information is not intended to replace advice given to you by your health care provider. Make sure you discuss any questions you have with your health care provider.   Document Released: 12/21/2007 Document Revised: 07/25/2014 Document Reviewed: 05/14/2013 Elsevier Interactive Patient Education [employer]

## 2015-10-08 NOTE — MAU Note (Signed)
Pt states has been having constant cramping and lower back pain 8/10 since this morning.  Denies vaginal bleeding or discharge.

## 2015-10-16 ENCOUNTER — Inpatient Hospital Stay (HOSPITAL_COMMUNITY)
Admission: AD | Admit: 2015-10-16 | Discharge: 2015-10-16 | Disposition: A | Discharge status: Home / Self Care | Payer: Medicaid Other | Source: Ambulatory Visit | Attending: Obstetrics and Gynecology | Admitting: Obstetrics and Gynecology

## 2015-10-16 ENCOUNTER — Encounter (HOSPITAL_COMMUNITY): Payer: Self-pay | Admitting: *Deleted

## 2015-10-16 DIAGNOSIS — O219 Vomiting of pregnancy, unspecified: Secondary | ICD-10-CM | POA: Diagnosis not present

## 2015-10-16 DIAGNOSIS — Z3A08 8 weeks gestation of pregnancy: Secondary | ICD-10-CM | POA: Diagnosis not present

## 2015-10-16 DIAGNOSIS — O210 Mild hyperemesis gravidarum: Secondary | ICD-10-CM | POA: Diagnosis present

## 2015-10-16 DIAGNOSIS — R748 Abnormal levels of other serum enzymes: Secondary | ICD-10-CM | POA: Diagnosis not present

## 2015-10-16 DIAGNOSIS — O10011 Pre-existing essential hypertension complicating pregnancy, first trimester: Secondary | ICD-10-CM | POA: Diagnosis not present

## 2015-10-16 LAB — COMPREHENSIVE METABOLIC PANEL
ALT: 154 U/L — ABNORMAL HIGH (ref 14–54)
ANION GAP: 9 (ref 5–15)
AST: 97 U/L — ABNORMAL HIGH (ref 15–41)
Albumin: 3.6 g/dL (ref 3.5–5.0)
Alkaline Phosphatase: 67 U/L (ref 38–126)
BILIRUBIN TOTAL: 0.4 mg/dL (ref 0.3–1.2)
BUN: 11 mg/dL (ref 6–20)
CALCIUM: 9 mg/dL (ref 8.9–10.3)
CO2: 23 mmol/L (ref 22–32)
Chloride: 102 mmol/L (ref 101–111)
Creatinine, Ser: 0.69 mg/dL (ref 0.44–1.00)
GFR calc Af Amer: >60 mL/min (ref >60)
GLUCOSE: 79 mg/dL (ref 65–99)
POTASSIUM: 4.2 mmol/L (ref 3.5–5.1)
Sodium: 134 mmol/L — ABNORMAL LOW (ref 135–145)
TOTAL PROTEIN: 7.8 g/dL (ref 6.5–8.1)

## 2015-10-16 LAB — CBC
HEMATOCRIT: 34.5 % — AB (ref 36.0–46.0)
HEMOGLOBIN: 11.6 g/dL — AB (ref 12.0–15.0)
MCH: 28 pg (ref 26.0–34.0)
MCHC: 33.6 g/dL (ref 30.0–36.0)
MCV: 83.3 fL (ref 78.0–100.0)
Platelets: 279 10*3/uL (ref 150–400)
RBC: 4.14 MIL/uL (ref 3.87–5.11)
RDW: 13.8 % (ref 11.5–15.5)
WBC: 10.1 10*3/uL (ref 4.0–10.5)

## 2015-10-16 LAB — URINE MICROSCOPIC-ADD ON: WBC UA: NONE SEEN WBC/hpf (ref 0–5)

## 2015-10-16 LAB — URINALYSIS, ROUTINE W REFLEX MICROSCOPIC
BILIRUBIN URINE: NEGATIVE
Glucose, UA: NEGATIVE mg/dL
KETONES UR: NEGATIVE mg/dL
Leukocytes, UA: NEGATIVE
NITRITE: NEGATIVE
PROTEIN: NEGATIVE mg/dL
SPECIFIC GRAVITY, URINE: 1.025 (ref 1.005–1.030)
pH: 6 (ref 5.0–8.0)

## 2015-10-16 LAB — LIPASE, BLOOD: Lipase: 35 U/L (ref 11–51)

## 2015-10-16 LAB — AMYLASE: Amylase: 77 U/L (ref 28–100)

## 2015-10-16 MED ORDER — PROMETHAZINE HCL 25 MG PO TABS: 25.0000 mg | ORAL_TABLET | Freq: Four times a day (QID) | ORAL | Status: DC | PRN

## 2015-10-16 MED ORDER — METOCLOPRAMIDE HCL 5 MG/ML IJ SOLN
10.0000 mg | Freq: Once | INTRAMUSCULAR | Status: AC
  Administered 2015-10-16: 10 mg via INTRAVENOUS

## 2015-10-16 MED ORDER — DEXTROSE 5 % IN LACTATED RINGERS IV BOLUS
1000.0000 mL | Freq: Once | INTRAVENOUS | Status: AC
  Administered 2015-10-16: 1000 mL via INTRAVENOUS

## 2015-10-16 NOTE — MAU Note (Addendum)
Unable to keep down flds or food for awhile but worse since last night. Have promethazine at home but makes me feel worse. No pain. No diarrhea

## 2015-10-16 NOTE — Discharge Instructions (Signed)
Eating Plan for Hyperemesis Gravidarum °Severe cases of hyperemesis gravidarum can lead to dehydration and malnutrition. The hyperemesis eating plan is one way to lessen the symptoms of nausea and vomiting. It is often used with prescribed medicines to control your symptoms.  °WHAT CAN I DO TO RELIEVE MY SYMPTOMS? °Listen to your body. Everyone is different and has different preferences. Find what works best for you. Some of the following things may help: °· Eat and drink slowly. °· Eat 5-6 small meals daily instead of 3 large meals.   °· Eat crackers before you get out of bed in the morning.   °· Starchy foods are usually well tolerated (such as cereal, toast, bread, potatoes, pasta, rice, and pretzels).   °· Ginger may help with nausea. Add ¼ tsp ground ginger to hot tea or choose ginger tea.   °· Try drinking 100% fruit juice or an electrolyte drink. °· Continue to take your prenatal vitamins as directed by your health care provider. If you are having trouble taking your prenatal vitamins, talk with your health care provider about different options. °· Include at least 1 serving of protein with your meals and snacks (such as meats or poultry, beans, nuts, eggs, or yogurt). Try eating a protein-rich snack before bed (such as cheese and crackers or a half turkey or peanut butter sandwich). °WHAT THINGS SHOULD I AVOID TO REDUCE MY SYMPTOMS? °The following things may help reduce your symptoms: °· Avoid foods with strong smells. Try eating meals in well-ventilated areas that are free of odors. °· Avoid drinking water or other beverages with meals. Try not to drink anything less than 30 minutes before and after meals. °· Avoid drinking more than 1 cup of fluid at a time. °· Avoid fried or high-fat foods, such as butter and cream sauces. °· Avoid spicy foods. °· Avoid skipping meals the best you can. Nausea can be more intense on an empty stomach. If you cannot tolerate food at that time, do not force it. Try sucking on  ice chips or other frozen items and make up the calories later. °· Avoid lying down within 2 hours after eating. °  °This information is not intended to replace advice given to you by your health care provider. Make sure you discuss any questions you have with your health care provider. °  °Document Released: 05/01/2007 Document Revised: 07/09/2013 Document Reviewed: 05/08/2013 °Elsevier Interactive Patient Education ©2016 Elsevier Inc. ° ° ° ° ° °Safe Medications in Pregnancy  ° °Acne: °Benzoyl Peroxide °Salicylic Acid ° °Backache/Headache: °Tylenol: 2 regular strength every 4 hours OR °             2 Extra strength every 6 hours ° °Colds/Coughs/Allergies: °Benadryl (alcohol free) 25 mg every 6 hours as needed °Breath right strips °Claritin °Cepacol throat lozenges °Chloraseptic throat spray °Cold-Eeze- up to three times per day °Cough drops, alcohol free °Flonase (by prescription only) °Guaifenesin °Mucinex °Robitussin DM (plain only, alcohol free) °Saline nasal spray/drops °Sudafed (pseudoephedrine) & Actifed ** use only after [redacted] weeks gestation and if you do not have high blood pressure °Tylenol °Vicks Vaporub °Zinc lozenges °Zyrtec  ° °Constipation: °Colace °Ducolax suppositories °Fleet enema °Glycerin suppositories °Metamucil °Milk of magnesia °Miralax °Senokot °Smooth move tea ° °Diarrhea: °Kaopectate °Imodium A-D ° °*NO pepto Bismol ° °Hemorrhoids: °Anusol °Anusol HC °Preparation H °Tucks ° °Indigestion: °Tums °Maalox °Mylanta °Zantac  °Pepcid ° °Insomnia: °Benadryl (alcohol free) 25mg every 6 hours as needed °Tylenol PM °Unisom, no Gelcaps ° °Leg Cramps: °Tums °MagGel ° °Nausea/Vomiting:  °  Bonine °Dramamine °Emetrol °Ginger extract °Sea bands °Meclizine  °Nausea medication to take during pregnancy:  °Unisom (doxylamine succinate 25 mg tablets) Take one tablet daily at bedtime. If symptoms are not adequately controlled, the dose can be increased to a maximum recommended dose of two tablets daily (1/2 tablet  in the morning, 1/2 tablet mid-afternoon and one at bedtime). °Vitamin B6 100mg tablets. Take one tablet twice a day (up to 200 mg per day). ° °Skin Rashes: °Aveeno products °Benadryl cream or 25mg every 6 hours as needed °Calamine Lotion °1% cortisone cream ° °Yeast infection: °Gyne-lotrimin 7 °Monistat 7 ° °Gum/tooth pain: °Anbesol ° °**If taking multiple medications, please check labels to avoid duplicating the same active ingredients °**take medication as directed on the label °** Do not exceed 4000 mg of tylenol in 24 hours °**Do not take medications that contain aspirin or ibuprofen ° ° ° ° °

## 2015-10-16 NOTE — MAU Provider Note (Signed)
History     CSN: [id number]  Arrival date and time: 10/16/15 2536   First Provider Initiated Contact with Patient 10/16/15 2019        Chief Complaint  Patient presents with  . Emesis During Pregnancy   HPI Comments: Brittany [name] is a 35 y.o. [id number] at [redacted]w[redacted]d by ultrasound who presents for n/v. Reports vomiting throughout pregnancy but worse yesterday. Vomited 3 times today. Tried eating this morning but couldn't keep it down. Denies abdominal pain or vaginal bleeding.   Emesis  This is a recurrent problem. The current episode started yesterday. The problem occurs 2 to 4 times per day. The problem has been gradually worsening. The emesis has an appearance of bile. There has been no fever. Pertinent negatives include no abdominal pain, chills, diarrhea, dizziness, fever or headaches. Treatments tried: promethazine. The treatment provided no relief.     OB History    Gravida Para Term Preterm AB TAB SAB Ectopic Multiple Living   0 0 2      Past Medical History  Diagnosis Date  . Headache(784.0)   . Hypertension   . Pregnancy induced hypertension     Past Surgical History  Procedure Laterality Date  . Multiple abortions    . Leep      Family History  Problem Relation Age of Onset  . Asthma Mother   . Hypertension Mother   . Asthma Maternal Grandmother     Social History  Substance Use Topics  . Smoking status: Never Smoker   . Smokeless tobacco: Never Used  . Alcohol Use: No    Allergies: No Known Allergies  Prescriptions prior to admission  Medication Sig Dispense Refill Last Dose  . acetaminophen (TYLENOL) 500 MG tablet Take 1,000 mg by mouth every 6 (six) hours as needed for moderate pain.   10/15/2015 at Unknown time  . docusate sodium (COLACE) 100 MG capsule Take 1 capsule (100 mg total) by mouth every 12 (twelve) hours. (Patient taking differently: Take 100 mg by mouth daily as needed. ) 60 capsule 0 Past Month at Unknown time  .  oxyCODONE-acetaminophen (PERCOCET/ROXICET) 5-325 MG tablet Take 1 tablet by mouth every 6 (six) hours as needed for severe pain. 5 tablet 0 Past Month at Unknown time  . Prenatal Vit-Fe Fumarate-FA (PRENATAL MULTIVITAMIN) TABS tablet Take 1 tablet by mouth daily at 12 noon.   Past Month at Unknown time  . promethazine (PHENERGAN) 12.5 MG tablet Take 1 tablet (12.5 mg total) by mouth every 6 (six) hours as needed for nausea or vomiting. 30 tablet 0 10/15/2015 at Unknown time    Review of Systems  Constitutional: Negative.  Negative for fever and chills.  Gastrointestinal: Positive for nausea and vomiting. Negative for heartburn, abdominal pain, diarrhea and constipation.  Genitourinary: Negative.   Neurological: Negative for dizziness and headaches.   Physical Exam   Blood pressure 118/75, pulse 82, temperature 98.5 F (36.9 C), temperature source Oral, resp. rate 18, height  (1.676 m), weight 177 lb 6.4 oz (80.468 kg), last menstrual period 08/17/2015.  Physical Exam  Nursing note and vitals reviewed. Constitutional: She is oriented to person, place, and time. She appears well-developed and well-nourished. No distress.  HENT:  Head: Normocephalic and atraumatic.  Eyes: Conjunctivae are normal. Right eye exhibits no discharge. Left eye exhibits no discharge. No scleral icterus.  Neck: Normal range of motion.  Cardiovascular: Normal rate, regular rhythm and normal heart sounds.  No murmur heard. Respiratory: Effort normal and breath sounds normal. No respiratory distress. She has no wheezes.  GI: Soft. Bowel sounds are normal. She exhibits no distension. There is no tenderness. There is negative Murphy's sign.  Neurological: She is alert and oriented to person, place, and time.  Skin: Skin is warm and dry. She is not diaphoretic.  Psychiatric: She has a normal mood and affect. Her behavior is normal. Judgment and thought content normal.    MAU Course  Procedures Results for  orders placed or performed during the hospital encounter of 10/16/15 (from the past 24 hour(s))  Urinalysis, Routine w reflex microscopic (not at [facility])     Status: Abnormal   Collection Time: 10/16/15  8:05 PM  Result Value Ref Range   Color, Urine YELLOW YELLOW   APPearance CLEAR CLEAR   Specific Gravity, Urine 1.025 1.005 - 1.030   pH 6.0 5.0 - 8.0   Glucose, UA NEGATIVE NEGATIVE mg/dL   Hgb urine dipstick TRACE (A) NEGATIVE   Bilirubin Urine NEGATIVE NEGATIVE   Ketones, ur NEGATIVE NEGATIVE mg/dL   Protein, ur NEGATIVE NEGATIVE mg/dL   Nitrite NEGATIVE NEGATIVE   Leukocytes, UA NEGATIVE NEGATIVE  Urine microscopic-add on     Status: Abnormal   Collection Time: 10/16/15  8:05 PM  Result Value Ref Range   Squamous Epithelial / LPF 6-30 (A) NONE SEEN   WBC, UA NONE SEEN 0 - 5 WBC/hpf   RBC / HPF 0-5 0 - 5 RBC/hpf   Bacteria, UA RARE (A) NONE SEEN   Urine-Other MUCOUS PRESENT   CBC     Status: Abnormal   Collection Time: 10/16/15  8:35 PM  Result Value Ref Range   WBC 10.1 4.0 - 10.5 K/uL   RBC 4.14 3.87 - 5.11 MIL/uL   Hemoglobin 11.6 (L) 12.0 - 15.0 g/dL   HCT 16.1 (L) 09.6 - 04.5 %   MCV 83.3 78.0 - 100.0 fL   MCH 28.0 26.0 - 34.0 pg   MCHC 33.6 30.0 - 36.0 g/dL   RDW 40.9 81.1 - 91.4 %   Platelets 279 150 - 400 K/uL  Comprehensive metabolic panel     Status: Abnormal   Collection Time: 10/16/15  8:35 PM  Result Value Ref Range   Sodium 134 (L) 135 - 145 mmol/L   Potassium 4.2 3.5 - 5.1 mmol/L   Chloride 102 101 - 111 mmol/L   CO2 23 22 - 32 mmol/L   Glucose, Bld 79 65 - 99 mg/dL   BUN 11 6 - 20 mg/dL   Creatinine, Ser 7.82 0.44 - 1.00 mg/dL   Calcium 9.0 8.9 - 95.6 mg/dL   Total Protein 7.8 6.5 - 8.1 g/dL   Albumin 3.6 3.5 - 5.0 g/dL   AST 97 (H) 15 - 41 U/L   ALT 154 (H) 14 - 54 U/L   Alkaline Phosphatase 67 38 - 126 U/L   Total Bilirubin 0.4 0.3 - 1.2 mg/dL   GFR calc non Af Amer >60 >60 mL/min   GFR calc Af Amer >60 >60 mL/min   Anion gap 9 5 - 15  Amylase      Status: None   Collection Time: 10/16/15  8:35 PM  Result Value Ref Range   Amylase 77 28 - 100 U/L  Lipase, blood     Status: None   Collection Time: 10/16/15  8:35 PM  Result Value Ref Range   Lipase 35 11 - 51 U/L    MDM  IV D5LR bolus Reglan 10 mg IV CBC, CMP Amylase, Lipase, & Hepatitis panel ordered d/t elevated AST/ALT Pt reports improvement in symptoms; has not vomited in MAU S/w Dr. Jolayne Pantheronstant regarding labs. Ok to discharge home.  Assessment and Plan  A: 1. Vomiting pregnancy   2. Elevated liver enzymes     P; Discharge home Rx phenergan 25 mg Limit tylenol use & do not take tylenol if taking percocet Discussed reasons to return to MAU Start prenatal care with Dr. Inge RiseHarper  Brittany [name] 10/16/2015, 8:19 PM

## 2015-10-18 LAB — HEPATITIS PANEL, ACUTE
HEP B S AG: NEGATIVE
Hep A IgM: NEGATIVE
Hep B C IgM: NEGATIVE

## 2015-10-25 ENCOUNTER — Encounter (HOSPITAL_COMMUNITY): Payer: Self-pay

## 2015-10-25 ENCOUNTER — Inpatient Hospital Stay (HOSPITAL_COMMUNITY)
Admission: AD | Admit: 2015-10-25 | Discharge: 2015-10-25 | Disposition: A | Discharge status: Home / Self Care | Payer: Medicaid Other | Source: Ambulatory Visit | Attending: Obstetrics & Gynecology | Admitting: Obstetrics & Gynecology

## 2015-10-25 DIAGNOSIS — O26891 Other specified pregnancy related conditions, first trimester: Secondary | ICD-10-CM | POA: Diagnosis not present

## 2015-10-25 DIAGNOSIS — E860 Dehydration: Secondary | ICD-10-CM

## 2015-10-25 DIAGNOSIS — R102 Pelvic and perineal pain: Secondary | ICD-10-CM

## 2015-10-25 DIAGNOSIS — R7989 Other specified abnormal findings of blood chemistry: Secondary | ICD-10-CM | POA: Diagnosis not present

## 2015-10-25 DIAGNOSIS — O219 Vomiting of pregnancy, unspecified: Secondary | ICD-10-CM | POA: Diagnosis not present

## 2015-10-25 DIAGNOSIS — O218 Other vomiting complicating pregnancy: Secondary | ICD-10-CM | POA: Insufficient documentation

## 2015-10-25 DIAGNOSIS — Z79899 Other long term (current) drug therapy: Secondary | ICD-10-CM | POA: Diagnosis not present

## 2015-10-25 DIAGNOSIS — Z3A09 9 weeks gestation of pregnancy: Secondary | ICD-10-CM | POA: Insufficient documentation

## 2015-10-25 DIAGNOSIS — R42 Dizziness and giddiness: Secondary | ICD-10-CM | POA: Diagnosis present

## 2015-10-25 LAB — URINE MICROSCOPIC-ADD ON
Squamous Epithelial / LPF: NONE SEEN
WBC, UA: NONE SEEN WBC/hpf (ref 0–5)

## 2015-10-25 LAB — COMPREHENSIVE METABOLIC PANEL
ALK PHOS: 53 U/L (ref 38–126)
ALT: 55 U/L — AB (ref 14–54)
AST: 30 U/L (ref 15–41)
Albumin: 3.4 g/dL — ABNORMAL LOW (ref 3.5–5.0)
Anion gap: 5 (ref 5–15)
BILIRUBIN TOTAL: 0.3 mg/dL (ref 0.3–1.2)
BUN: 10 mg/dL (ref 6–20)
CO2: 23 mmol/L (ref 22–32)
CREATININE: 0.69 mg/dL (ref 0.44–1.00)
Calcium: 8.6 mg/dL — ABNORMAL LOW (ref 8.9–10.3)
Chloride: 108 mmol/L (ref 101–111)
GFR calc Af Amer: >60 mL/min (ref >60)
GLUCOSE: 96 mg/dL (ref 65–99)
POTASSIUM: 3.9 mmol/L (ref 3.5–5.1)
Sodium: 136 mmol/L (ref 135–145)
Total Protein: 7.3 g/dL (ref 6.5–8.1)

## 2015-10-25 LAB — URINALYSIS, ROUTINE W REFLEX MICROSCOPIC
Bilirubin Urine: NEGATIVE
GLUCOSE, UA: NEGATIVE mg/dL
KETONES UR: NEGATIVE mg/dL
Leukocytes, UA: NEGATIVE
Nitrite: NEGATIVE
PH: 7 (ref 5.0–8.0)
PROTEIN: NEGATIVE mg/dL
Specific Gravity, Urine: 1.015 (ref 1.005–1.030)

## 2015-10-25 LAB — CBC WITH DIFFERENTIAL/PLATELET
BASOS ABS: 0 10*3/uL (ref 0.0–0.1)
Basophils Relative: 0 %
Eosinophils Absolute: 0.2 10*3/uL (ref 0.0–0.7)
Eosinophils Relative: 2 %
HEMATOCRIT: 32.8 % — AB (ref 36.0–46.0)
HEMOGLOBIN: 11.1 g/dL — AB (ref 12.0–15.0)
LYMPHS PCT: 21 %
Lymphs Abs: 2.2 10*3/uL (ref 0.7–4.0)
MCH: 28 pg (ref 26.0–34.0)
MCHC: 33.8 g/dL (ref 30.0–36.0)
MCV: 82.8 fL (ref 78.0–100.0)
MONO ABS: 0.8 10*3/uL (ref 0.1–1.0)
Monocytes Relative: 8 %
NEUTROS PCT: 69 %
Neutro Abs: 7.5 10*3/uL (ref 1.7–7.7)
Platelets: 261 10*3/uL (ref 150–400)
RBC: 3.96 MIL/uL (ref 3.87–5.11)
RDW: 13.8 % (ref 11.5–15.5)
WBC: 10.8 10*3/uL — ABNORMAL HIGH (ref 4.0–10.5)

## 2015-10-25 LAB — AMYLASE: Amylase: 73 U/L (ref 28–100)

## 2015-10-25 LAB — LIPASE, BLOOD: LIPASE: 32 U/L (ref 11–51)

## 2015-10-25 MED ORDER — METOCLOPRAMIDE HCL 10 MG PO TABS: 10.0000 mg | ORAL_TABLET | Freq: Four times a day (QID) | ORAL | Status: DC

## 2015-10-25 MED ORDER — METOCLOPRAMIDE HCL 10 MG PO TABS
10.0000 mg | ORAL_TABLET | Freq: Once | ORAL | Status: AC
  Administered 2015-10-25: 10 mg via ORAL
  Filled 2015-10-25: qty 1

## 2015-10-25 NOTE — Discharge Instructions (Signed)

## 2015-10-25 NOTE — MAU Note (Signed)
Pt here with nausea and lightheadedness for past couple of weeks. Cramping since this morning.

## 2015-10-25 NOTE — MAU Provider Note (Signed)
Chief Complaint: Dizziness and Nausea   First Provider Initiated Contact with Patient 10/25/15 2027        SUBJECTIVE   Brittany [name] is a 35 y.o. [id number] at 3657w6d by LMP who presents to maternity admissions reporting lightheadedness, nausea and cramping in lower abdomen.   Has been using Phenergan but it makes her sleepy.  Has been eating and driinking but is lightheaded at work. Intermittent cramping lower abdomen, sharp.  She denies vaginal bleeding, vaginal itching/burning, urinary symptoms, h/a, vomiting, or fever/chills.    Dizziness This is a recurrent problem. The current episode started 1 to 4 weeks ago. The problem occurs intermittently. The problem has been unchanged. Associated symptoms include abdominal pain, fatigue and nausea. Pertinent negatives include no arthralgias, chest pain, chills, congestion, coughing, fever, headaches, vertigo or vomiting. The symptoms are aggravated by standing and walking. She has tried nothing for the symptoms.  Abdominal Cramping This is a new problem. The current episode started yesterday. The onset quality is gradual. The problem occurs intermittently. The pain is located in the LLQ and RLQ. The quality of the pain is sharp. The abdominal pain does not radiate. Associated symptoms include nausea. Pertinent negatives include no arthralgias, fever, headaches or vomiting. Nothing aggravates the pain. The pain is relieved by nothing. She has tried nothing for the symptoms.   RN note  Expand All Collapse All   Pt here with nausea and lightheadedness for past couple of weeks. Cramping since this morning.          Past Medical History  Diagnosis Date  . Headache(784.0)   . Hypertension   . Pregnancy induced hypertension    Past Surgical History  Procedure Laterality Date  . Leep     Social History   Social History  . Marital Status: Married    Spouse Name: N/A  . Number of Children: N/A  . Years of Education: N/A   Occupational  History  . Not on file.   Social History Main Topics  . Smoking status: Never Smoker   . Smokeless tobacco: Never Used  . Alcohol Use: No  . Drug Use: No  . Sexual Activity: Yes     Comment: last intercourse 09/24/15   Other Topics Concern  . Not on file   Social History Narrative   No current facility-administered medications on file prior to encounter.   Current Outpatient Prescriptions on File Prior to Encounter  Medication Sig Dispense Refill  . acetaminophen (TYLENOL) 500 MG tablet Take 1,000 mg by mouth every 6 (six) hours as needed for mild pain or headache.     . Prenatal Vit-Fe Fumarate-FA (PRENATAL MULTIVITAMIN) TABS tablet Take 1 tablet by mouth daily.     . promethazine (PHENERGAN) 25 MG tablet Take 1 tablet (25 mg total) by mouth every 6 (six) hours as needed for nausea or vomiting. 30 tablet 0  . docusate sodium (COLACE) 100 MG capsule Take 1 capsule (100 mg total) by mouth every 12 (twelve) hours. (Patient not taking: Reported on 10/25/2015) 60 capsule 0  . oxyCODONE-acetaminophen (PERCOCET/ROXICET) 5-325 MG tablet Take 1 tablet by mouth every 6 (six) hours as needed for severe pain. (Patient not taking: Reported on 10/25/2015) 5 tablet 0   No Known Allergies  I have reviewed patient's Past Medical Hx, Surgical Hx, Family Hx, Social Hx, medications and allergies.   ROS:  Review of Systems  Constitutional: Positive for fatigue. Negative for fever and chills.  HENT: Negative for congestion.   Respiratory:  Negative for cough.   Cardiovascular: Negative for chest pain.  Gastrointestinal: Positive for nausea and abdominal pain. Negative for vomiting.  Musculoskeletal: Negative for arthralgias.  Neurological: Positive for dizziness. Negative for vertigo and headaches.   Review of Systems  Constitutional: Negative for fever and chills.  Gastrointestinal: Negative for nausea, vomiting, abdominal pain, diarrhea and constipation.  Positive for abdominal pelvic  pain Genitourinary: Negative for dysuria. negative Positive for bleeding Musculoskeletal: Negative for back pain.  Neurological: Negative for dizziness and weakness.    Physical Exam  Patient Vitals for the past 24 hrs:  BP Temp Temp src Pulse Resp Height  10/25/15 2019 99/62 mmHg 98.5 F (36.9 C) Oral 90 18  (1.676 m)   Orthostatic VS: Sitting:  123/68, HR 78 Standing 118/73, HR 97   Physical Exam  Constitutional: Well-developed female in no acute distress.  Cardiovascular: normal rate and rhythm Respiratory: normal effort GI: Abd soft, non-tender. Pos BS x 4 MS: Extremities nontender, no edema, normal ROM Neurologic: Alert and oriented x 4.  GU: Neg CVAT.  PELVIC EXAM:  Cervix 0/long/high, firm, anterior, neg CMT, uterus nontender,  adnexa without tenderness, enlargement, or mass  Bedside US showed 10 wk sized fetus with HR 160, normal GS, normal yolk sac  LAB RESULTS Results for orders placed or performed during the hospital encounter of 10/25/15 (from the past 24 hour(s))  CBC with Differential     Status: Abnormal   Collection Time: 10/25/15  8:00 PM  Result Value Ref Range   WBC 10.8 (H) 4.0 - 10.5 K/uL   RBC 3.96 3.87 - 5.11 MIL/uL   Hemoglobin 11.1 (L) 12.0 - 15.0 g/dL   HCT 11.9 (L) 14.7 - 82.9 %   MCV 82.8 78.0 - 100.0 fL   MCH 28.0 26.0 - 34.0 pg   MCHC 33.8 30.0 - 36.0 g/dL   RDW 56.2 13.0 - 86.5 %   Platelets 261 150 - 400 K/uL   Neutrophils Relative % 69 %   Neutro Abs 7.5 1.7 - 7.7 K/uL   Lymphocytes Relative 21 %   Lymphs Abs 2.2 0.7 - 4.0 K/uL   Monocytes Relative 8 %   Monocytes Absolute 0.8 0.1 - 1.0 K/uL   Eosinophils Relative 2 %   Eosinophils Absolute 0.2 0.0 - 0.7 K/uL   Basophils Relative 0 %   Basophils Absolute 0.0 0.0 - 0.1 K/uL  Comprehensive metabolic panel     Status: Abnormal   Collection Time: 10/25/15  8:00 PM  Result Value Ref Range   Sodium 136 135 - 145 mmol/L   Potassium 3.9 3.5 - 5.1 mmol/L   Chloride 108 101 - 111  mmol/L   CO2 23 22 - 32 mmol/L   Glucose, Bld 96 65 - 99 mg/dL   BUN 10 6 - 20 mg/dL   Creatinine, Ser 7.84 0.44 - 1.00 mg/dL   Calcium 8.6 (L) 8.9 - 10.3 mg/dL   Total Protein 7.3 6.5 - 8.1 g/dL   Albumin 3.4 (L) 3.5 - 5.0 g/dL   AST 30 15 - 41 U/L   ALT 55 (H) 14 - 54 U/L   Alkaline Phosphatase 53 38 - 126 U/L   Total Bilirubin 0.3 0.3 - 1.2 mg/dL   GFR calc non Af Amer >60 >60 mL/min   GFR calc Af Amer >60 >60 mL/min   Anion gap 5 5 - 15  Amylase     Status: None   Collection Time: 10/25/15  8:00 PM  Result  Value Ref Range   Amylase 73 28 - 100 U/L  Lipase, blood     Status: None   Collection Time: 10/25/15  8:00 PM  Result Value Ref Range   Lipase 32 11 - 51 U/L       IMAGING  MAU Management/MDM: Orthostatic vitals show some increase in HR reflecting mild dehydration Will check UA: SpG 1.015  Neg Ketones Suspect she is not eating and drinking as much as she thinks she is. Will add Reglan for daytime use and encourage more water Reglan given, tolerated ginger ale Cervix closed, so doubt threatened miscarriage  ASSESSMENT SIUP at [redacted]w[redacted]d  Nausea and vomiting of early pregnancy Sharp pains lower abdomen, probably stretching Previously elevated LFTs, now normalized  PLAN Discharge home Rx Reglan for daytime use for nausea, then use Phenergan at night Increase PO hydration Reassurance given that some cramping is normal Follow up with prenatal care (has appt Thursday) Note for out of work tomorrow    Medication List    ASK your doctor about these medications        acetaminophen 500 MG tablet  Commonly known as:  TYLENOL  Take 1,000 mg by mouth every 6 (six) hours as needed for mild pain or headache.     docusate sodium 100 MG capsule  Commonly known as:  COLACE  Take 1 capsule (100 mg total) by mouth every 12 (twelve) hours.     oxyCODONE-acetaminophen 5-325 MG tablet  Commonly known as:  PERCOCET/ROXICET  Take 1 tablet by mouth every 6 (six) hours as  needed for severe pain.     prenatal multivitamin Tabs tablet  Take 1 tablet by mouth daily.     promethazine 25 MG tablet  Commonly known as:  PHENERGAN  Take 1 tablet (25 mg total) by mouth every 6 (six) hours as needed for nausea or vomiting.        Pt stable at time of discharge. Encouraged to return here or to other Urgent Care/ED if she develops worsening of symptoms, increase in pain, fever, or other concerning symptoms.    Wynelle Bourgeois CNM, MSN Certified Nurse-Midwife 10/25/2015  8:41 PM

## 2015-10-29 ENCOUNTER — Ambulatory Visit (INDEPENDENT_AMBULATORY_CARE_PROVIDER_SITE_OTHER): Payer: Medicaid Other | Admitting: Certified Nurse Midwife

## 2015-10-29 VITALS — BP 125/80 | HR 88 | Temp 98.7°F

## 2015-10-29 DIAGNOSIS — O0990 Supervision of high risk pregnancy, unspecified, unspecified trimester: Secondary | ICD-10-CM | POA: Insufficient documentation

## 2015-10-29 DIAGNOSIS — Z3491 Encounter for supervision of normal pregnancy, unspecified, first trimester: Secondary | ICD-10-CM | POA: Diagnosis not present

## 2015-10-29 DIAGNOSIS — O219 Vomiting of pregnancy, unspecified: Secondary | ICD-10-CM

## 2015-10-29 DIAGNOSIS — O0991 Supervision of high risk pregnancy, unspecified, first trimester: Secondary | ICD-10-CM

## 2015-10-29 LAB — POCT URINALYSIS DIPSTICK
BILIRUBIN UA: NEGATIVE
GLUCOSE UA: NEGATIVE
LEUKOCYTES UA: NEGATIVE
NITRITE UA: NEGATIVE
Protein, UA: NEGATIVE
Spec Grav, UA: 1.025
Urobilinogen, UA: NEGATIVE
pH, UA: 5

## 2015-10-29 MED ORDER — VITAFOL GUMMIES 3.33-0.333-34.8 MG PO CHEW: 3.0000 | CHEWABLE_TABLET | Freq: Every day | ORAL | Status: DC

## 2015-10-29 MED ORDER — DOXYLAMINE-PYRIDOXINE 10-10 MG PO TBEC: DELAYED_RELEASE_TABLET | ORAL | Status: DC

## 2015-10-29 NOTE — Progress Notes (Signed)
Subjective:    Brittany [name] is being seen today for her first obstetrical visit.  This is not a planned pregnancy. She is at [redacted]w[redacted]d gestation. Her obstetrical history is significant for pregnancy induced hypertension and preterm delivery at 36 weeks. Relationship with FOB: significant other, living together. Patient does intend to breast feed. Pregnancy history fully reviewed.  Currently tandem breastfeeding.    The information documented in the HPI was reviewed and verified.  Menstrual History: OB History    Gravida Para Term Preterm AB TAB SAB Ectopic Multiple Living   0 0 2      Menarche age: 35 years old  Patient's last menstrual period was 08/17/2015.    Past Medical History  Diagnosis Date  . Headache(784.0)   . Hypertension   . Pregnancy induced hypertension     Past Surgical History  Procedure Laterality Date  . Leep       (Not in a hospital admission) No Known Allergies  Social History  Substance Use Topics  . Smoking status: Never Smoker   . Smokeless tobacco: Never Used  . Alcohol Use: No    Family History  Problem Relation Age of Onset  . Asthma Mother   . Hypertension Mother   . Asthma Maternal Grandmother      Review of Systems Constitutional: negative for weight loss Gastrointestinal: + for nausea and vomiting Genitourinary:negative for genital lesions and vaginal discharge and dysuria Musculoskeletal:negative for back pain Behavioral/Psych: negative for abusive relationship, depression, illegal drug usage and tobacco use    Objective:    BP 125/80 mmHg  Pulse 88  Temp(Src) 98.7 F (37.1 C)  LMP 08/17/2015 General Appearance:    Alert, cooperative, no distress, appears stated age  Head:    Normocephalic, without obvious abnormality, atraumatic  Eyes:    PERRL, conjunctiva/corneas clear, EOM's intact, fundi    benign, both eyes  Ears:    Normal TM's and external ear canals, both ears  Nose:   Nares normal, septum midline,  mucosa normal, no drainage    or sinus tenderness  Throat:   Lips, mucosa, and tongue normal; teeth and gums normal  Neck:   Supple, symmetrical, trachea midline, no adenopathy;    thyroid:  no enlargement/tenderness/nodules; no carotid   bruit or JVD  Back:     Symmetric, no curvature, ROM normal, no CVA tenderness  Lungs:     Clear to auscultation bilaterally, respirations unlabored  Chest Wall:    No tenderness or deformity   Heart:    Regular rate and rhythm, S1 and S2 normal, no murmur, rub   or gallop  Breast Exam:    No tenderness, masses, or nipple abnormality  Abdomen:     Soft, non-tender, bowel sounds active all four quadrants,    no masses, no organomegaly  Genitalia:    Normal female without lesion, discharge or tenderness  Extremities:   Extremities normal, atraumatic, no cyanosis or edema  Pulses:   2+ and symmetric all extremities  Skin:   Skin color, texture, turgor normal, no rashes or lesions  Lymph nodes:   Cervical, supraclavicular, and axillary nodes normal  Neurologic:   CNII-XII intact, normal strength, sensation and reflexes    throughout     Cervix:  Long, thick, closed and posterior.  FHR seen with bedside US, auscultated with doppler around 170    Lab Review Urine pregnancy test Labs reviewed yes Radiologic studies reviewed yes Assessment:  Pregnancy at 5225w3d weeks   N&V in early pregnancy  Plan:      Prenatal vitamins.  Counseling provided regarding continued use of seat belts, cessation of alcohol consumption, smoking or use of illicit drugs; infection precautions i.e., influenza/TDAP immunizations, toxoplasmosis,CMV, parvovirus, listeria and varicella; workplace safety, exercise during pregnancy; routine dental care, safe medications, sexual activity, hot tubs, saunas, pools, travel, caffeine use, fish and methlymercury, potential toxins, hair treatments, varicose veins Weight gain recommendations per IOM guidelines reviewed: underweight/BMI<  18.5--> gain 28 - 40 lbs; normal weight/BMI 18.5 - 24.9--> gain 25 - 35 lbs; overweight/BMI 25 - 29.9--> gain 15 - 25 lbs; obese/BMI >30->gain  11 - 20 lbs Problem list reviewed and updated. FIRST/CF mutation testing/NIPT/QUAD SCREEN/fragile X/Ashkenazi Jewish population testing/Spinal muscular atrophy discussed: requested. Role of ultrasound in pregnancy discussed; fetal survey: requested. Amniocentesis discussed: not indicated. VBAC calculator score: VBAC consent form provided No orders of the defined types were placed in this encounter.   Orders Placed This Encounter  Procedures  . POCT urinalysis dipstick    Follow up in 4 weeks. 50% of 30 min visit spent on counseling and coordination of care.

## 2015-10-29 NOTE — Progress Notes (Signed)
Patient has no concerns. Health Dept confirmed pregnanacy

## 2015-10-30 ENCOUNTER — Telehealth: Payer: Self-pay | Admitting: Certified Nurse Midwife

## 2015-10-30 NOTE — Telephone Encounter (Signed)
Pt called because she needs a prior auth submitted for her to get Diclegis.

## 2015-10-31 LAB — THYROID PANEL
FREE THYROXINE INDEX: 2.6 (ref 1.2–4.9)
T3 UPTAKE RATIO: 20 % — AB (ref 24–39)
T4, Total: 13 ug/dL — ABNORMAL HIGH (ref 4.5–12.0)

## 2015-10-31 LAB — SPECIMEN STATUS REPORT

## 2015-10-31 LAB — URINE CULTURE, OB REFLEX

## 2015-10-31 LAB — CULTURE, OB URINE

## 2015-11-03 ENCOUNTER — Other Ambulatory Visit: Payer: Self-pay | Admitting: Certified Nurse Midwife

## 2015-11-03 DIAGNOSIS — R7989 Other specified abnormal findings of blood chemistry: Secondary | ICD-10-CM

## 2015-11-03 LAB — PRENATAL PROFILE I(LABCORP)
ANTIBODY SCREEN: NEGATIVE
BASOS: 0 %
Basophils Absolute: 0 10*3/uL (ref 0.0–0.2)
EOS (ABSOLUTE): 0.1 10*3/uL (ref 0.0–0.4)
Eos: 1 %
HEMATOCRIT: 35.6 % (ref 34.0–46.6)
HEP B S AG: NEGATIVE
Hemoglobin: 11.8 g/dL (ref 11.1–15.9)
IMMATURE GRANS (ABS): 0 10*3/uL (ref 0.0–0.1)
Immature Granulocytes: 0 %
LYMPHS: 21 %
Lymphocytes Absolute: 2.3 10*3/uL (ref 0.7–3.1)
MCH: 27.7 pg (ref 26.6–33.0)
MCHC: 33.1 g/dL (ref 31.5–35.7)
MCV: 84 fL (ref 79–97)
MONOCYTES: 6 %
MONOS ABS: 0.7 10*3/uL (ref 0.1–0.9)
NEUTROS ABS: 8.1 10*3/uL — AB (ref 1.4–7.0)
Neutrophils: 72 %
Platelets: 296 10*3/uL (ref 150–379)
RBC: 4.26 x10E6/uL (ref 3.77–5.28)
RDW: 14.3 % (ref 12.3–15.4)
RPR: NONREACTIVE
RUBELLA: 2.58 {index} (ref >0.99)
Rh Factor: POSITIVE
WBC: 11.4 10*3/uL — AB (ref 3.4–10.8)

## 2015-11-03 LAB — HEMOGLOBINOPATHY EVALUATION
HGB C: 0 %
HGB S: 0 %
Hemoglobin A2 Quantitation: 1.3 % (ref 0.7–3.1)
Hemoglobin F Quantitation: 0 % (ref 0.0–2.0)
Hgb A: 97.4 % (ref 94.0–98.0)

## 2015-11-03 LAB — HIV ANTIBODY (ROUTINE TESTING W REFLEX): HIV Screen 4th Generation wRfx: NONREACTIVE

## 2015-11-03 LAB — VARICELLA ZOSTER ANTIBODY, IGG: VARICELLA: 982 {index} (ref >165)

## 2015-11-03 LAB — VITAMIN D 25 HYDROXY (VIT D DEFICIENCY, FRACTURES): Vit D, 25-Hydroxy: 17.4 ng/mL — ABNORMAL LOW (ref 30.0–100.0)

## 2015-11-03 LAB — TSH: TSH: 0.155 u[IU]/mL — ABNORMAL LOW (ref 0.450–4.500)

## 2015-11-04 ENCOUNTER — Encounter (HOSPITAL_COMMUNITY): Payer: Self-pay | Admitting: Certified Nurse Midwife

## 2015-11-05 NOTE — Telephone Encounter (Signed)
PA for Diclegis has been submitted and approved. Pharmacy has been made aware. 

## 2015-11-10 ENCOUNTER — Telehealth: Payer: Self-pay

## 2015-11-10 NOTE — Telephone Encounter (Signed)
patient is going to [facility] on 12/09/15 at 11am - she stated she had a ride to take her there - [facility] doesnt take medicaid, [facility] July-August - have not heard from [facility], but she seems willing to go here

## 2015-11-10 NOTE — Telephone Encounter (Signed)
[facility] will not see these patients as they don't take medicaid [facility]- [facility] is booked until August - trying to see if [facility] will see them - calling this am

## 2015-11-11 ENCOUNTER — Telehealth: Payer: Self-pay | Admitting: *Deleted

## 2015-11-11 NOTE — Telephone Encounter (Signed)
Appt 4/27

## 2015-11-11 NOTE — Telephone Encounter (Signed)
Brittany [name], please make her an appointment so that we can check her blood pressure and give her Fioricet.  Thank you.  R.Gavina Dildine CNM

## 2015-11-11 NOTE — Telephone Encounter (Signed)
Patient is complaining of headaches every morning - patient had lay down yesterday- it was so bad. Patient does not have headaches normally. Patient does report she had headache with her daughter- she was given something that helped. Patient's next appointment is on 5/10. Told patient I would speak with her provider and see what she wanted to do for her.

## 2015-11-12 ENCOUNTER — Ambulatory Visit (INDEPENDENT_AMBULATORY_CARE_PROVIDER_SITE_OTHER): Payer: Medicaid Other | Admitting: Certified Nurse Midwife

## 2015-11-12 VITALS — BP 116/77 | HR 96 | Temp 98.2°F | Wt 175.0 lb

## 2015-11-12 DIAGNOSIS — R51 Headache: Principal | ICD-10-CM

## 2015-11-12 DIAGNOSIS — Z3492 Encounter for supervision of normal pregnancy, unspecified, second trimester: Secondary | ICD-10-CM

## 2015-11-12 DIAGNOSIS — O26891 Other specified pregnancy related conditions, first trimester: Secondary | ICD-10-CM

## 2015-11-12 LAB — POCT URINALYSIS DIPSTICK
BILIRUBIN UA: NEGATIVE
Bilirubin, UA: NEGATIVE
Glucose, UA: NEGATIVE
Glucose, UA: NEGATIVE
KETONES UA: NEGATIVE
KETONES UA: NEGATIVE
LEUKOCYTES UA: NEGATIVE
Leukocytes, UA: NEGATIVE
Nitrite, UA: NEGATIVE
Nitrite, UA: NEGATIVE
PH UA: 5
PROTEIN UA: NEGATIVE
Protein, UA: NEGATIVE
RBC UA: 50
SPEC GRAV UA: 1.025
SPEC GRAV UA: 1.025
UROBILINOGEN UA: NEGATIVE
Urobilinogen, UA: NEGATIVE
pH, UA: 5

## 2015-11-12 MED ORDER — BUTALBITAL-APAP-CAFFEINE 50-325-40 MG PO TABS: 1.0000 | ORAL_TABLET | Freq: Four times a day (QID) | ORAL | Status: DC | PRN

## 2015-11-12 NOTE — Progress Notes (Signed)
Patient about headaches she's been having.

## 2015-11-12 NOTE — Progress Notes (Signed)
  Subjective:    Brittany [name] is a 35 y.o. female being seen today for her obstetrical visit. She is at 5560w3d gestation. Patient reports: headache, nausea, no bleeding, no contractions, no cramping, no leaking and vomiting.  Problem List Items Addressed This Visit    None    Visit Diagnoses    Headache in pregnancy, antepartum, first trimester    -  Primary    Relevant Medications    butalbital-acetaminophen-caffeine (FIORICET) 50-325-40 MG tablet      Patient Active Problem List   Diagnosis Date Noted  . Supervision of high risk pregnancy, antepartum 10/29/2015  . Pregnancy induced hypertension, delivered, current hospitalization 04/09/2011    Objective:     BP 116/77 mmHg  Pulse 96  Temp(Src) 98.2 F (36.8 C)  Wt 175 lb (79.379 kg)  LMP 08/17/2015 Uterine Size: Below umbilicus   FHR: 158 by doppler  Assessment:    Pregnancy @ 2660w3d  weeks HA in pregnancy    N&V in early pregnancy  Hx of PTD Plan:    Problem list reviewed and updated. Labs reviewed.  Follow up in as scheduled weeks. FIRST/CF mutation testing/NIPT/QUAD SCREEN/fragile X/Ashkenazi Jewish population testing/Spinal muscular atrophy discussed: requested. Role of ultrasound in pregnancy discussed; fetal survey: requested. Amniocentesis discussed: not indicated. 50% of 15 minute visit spent on counseling and coordination of care.

## 2015-11-13 ENCOUNTER — Inpatient Hospital Stay (HOSPITAL_COMMUNITY)
Admission: AD | Admit: 2015-11-13 | Discharge: 2015-11-13 | Disposition: A | Discharge status: Home / Self Care | Payer: Medicaid Other | Source: Ambulatory Visit | Attending: Obstetrics | Admitting: Obstetrics

## 2015-11-13 ENCOUNTER — Inpatient Hospital Stay (HOSPITAL_COMMUNITY): Payer: Medicaid Other

## 2015-11-13 ENCOUNTER — Encounter (HOSPITAL_COMMUNITY): Payer: Self-pay | Admitting: *Deleted

## 2015-11-13 DIAGNOSIS — Z3A12 12 weeks gestation of pregnancy: Secondary | ICD-10-CM | POA: Insufficient documentation

## 2015-11-13 DIAGNOSIS — O99611 Diseases of the digestive system complicating pregnancy, first trimester: Secondary | ICD-10-CM | POA: Diagnosis not present

## 2015-11-13 DIAGNOSIS — K8020 Calculus of gallbladder without cholecystitis without obstruction: Secondary | ICD-10-CM | POA: Insufficient documentation

## 2015-11-13 DIAGNOSIS — R102 Pelvic and perineal pain: Secondary | ICD-10-CM | POA: Insufficient documentation

## 2015-11-13 DIAGNOSIS — O26899 Other specified pregnancy related conditions, unspecified trimester: Secondary | ICD-10-CM

## 2015-11-13 DIAGNOSIS — I10 Essential (primary) hypertension: Secondary | ICD-10-CM | POA: Diagnosis not present

## 2015-11-13 DIAGNOSIS — R109 Unspecified abdominal pain: Secondary | ICD-10-CM | POA: Diagnosis not present

## 2015-11-13 DIAGNOSIS — O9989 Other specified diseases and conditions complicating pregnancy, childbirth and the puerperium: Secondary | ICD-10-CM | POA: Diagnosis not present

## 2015-11-13 DIAGNOSIS — O26891 Other specified pregnancy related conditions, first trimester: Secondary | ICD-10-CM | POA: Diagnosis not present

## 2015-11-13 DIAGNOSIS — Z79899 Other long term (current) drug therapy: Secondary | ICD-10-CM | POA: Diagnosis not present

## 2015-11-13 DIAGNOSIS — O26612 Liver and biliary tract disorders in pregnancy, second trimester: Secondary | ICD-10-CM

## 2015-11-13 LAB — AMYLASE: AMYLASE: 63 U/L (ref 28–100)

## 2015-11-13 LAB — COMPREHENSIVE METABOLIC PANEL
ALT: 29 U/L (ref 14–54)
AST: 26 U/L (ref 15–41)
Albumin: 3.5 g/dL (ref 3.5–5.0)
Alkaline Phosphatase: 45 U/L (ref 38–126)
Anion gap: 6 (ref 5–15)
BUN: 11 mg/dL (ref 6–20)
CHLORIDE: 105 mmol/L (ref 101–111)
CO2: 23 mmol/L (ref 22–32)
CREATININE: 0.64 mg/dL (ref 0.44–1.00)
Calcium: 9.4 mg/dL (ref 8.9–10.3)
Glucose, Bld: 84 mg/dL (ref 65–99)
Potassium: 3.9 mmol/L (ref 3.5–5.1)
Sodium: 134 mmol/L — ABNORMAL LOW (ref 135–145)
Total Bilirubin: 0.3 mg/dL (ref 0.3–1.2)
Total Protein: 7.4 g/dL (ref 6.5–8.1)

## 2015-11-13 LAB — URINALYSIS, ROUTINE W REFLEX MICROSCOPIC
Bilirubin Urine: NEGATIVE
GLUCOSE, UA: NEGATIVE mg/dL
Ketones, ur: NEGATIVE mg/dL
LEUKOCYTES UA: NEGATIVE
Nitrite: NEGATIVE
PH: 6.5 (ref 5.0–8.0)
Protein, ur: NEGATIVE mg/dL
Specific Gravity, Urine: 1.015 (ref 1.005–1.030)

## 2015-11-13 LAB — WET PREP, GENITAL
CLUE CELLS WET PREP: NONE SEEN
Sperm: NONE SEEN
Trich, Wet Prep: NONE SEEN
Yeast Wet Prep HPF POC: NONE SEEN

## 2015-11-13 LAB — URINE MICROSCOPIC-ADD ON: WBC UA: NONE SEEN WBC/hpf (ref 0–5)

## 2015-11-13 LAB — CBC
HCT: 34.7 % — ABNORMAL LOW (ref 36.0–46.0)
Hemoglobin: 11.9 g/dL — ABNORMAL LOW (ref 12.0–15.0)
MCH: 28.1 pg (ref 26.0–34.0)
MCHC: 34.3 g/dL (ref 30.0–36.0)
MCV: 81.8 fL (ref 78.0–100.0)
PLATELETS: 258 10*3/uL (ref 150–400)
RBC: 4.24 MIL/uL (ref 3.87–5.11)
RDW: 14 % (ref 11.5–15.5)
WBC: 9.9 10*3/uL (ref 4.0–10.5)

## 2015-11-13 LAB — LIPASE, BLOOD: LIPASE: 31 U/L (ref 11–51)

## 2015-11-13 MED ORDER — OXYCODONE-ACETAMINOPHEN 5-325 MG PO TABS: 1.0000 | ORAL_TABLET | Freq: Four times a day (QID) | ORAL | Status: DC | PRN

## 2015-11-13 NOTE — MAU Provider Note (Addendum)
Chief Complaint: Abdominal Pain   None     SUBJECTIVE HPI: Brittany [name] is a 35 y.o. [phone] at [redacted]w[redacted]d by LMP who presents to maternity admissions reporting acute onset of cramping abdominal pain not relieved by Tylenol or warm bath.  She noticed a small amount of pink vaginal spotting after the onset of pain this morning. She denies hx of STDs or known exposure.  She reports she is safe at home and denies abdominal trauma.  She denies constipation and reports BM this morning.  She reports nausea but no vomiting today.  Her miscarriages presented with similar symptoms at onset so she is concerned.  She denies vaginal itching/burning, urinary symptoms, h/a, dizziness, vomiting, or fever/chills.     HPI  Past Medical History  Diagnosis Date  . Headache(784.0)   . Hypertension   . Pregnancy induced hypertension    Past Surgical History  Procedure Laterality Date  . Leep     Social History   Social History  . Marital Status: Married    Spouse Name: N/A  . Number of Children: N/A  . Years of Education: N/A   Occupational History  . Not on file.   Social History Main Topics  . Smoking status: Never Smoker   . Smokeless tobacco: Never Used  . Alcohol Use: No  . Drug Use: No  . Sexual Activity: Yes     Comment: last intercourse 09/24/15   Other Topics Concern  . Not on file   Social History Narrative   No current facility-administered medications on file prior to encounter.   Current Outpatient Prescriptions on File Prior to Encounter  Medication Sig Dispense Refill  . acetaminophen (TYLENOL) 500 MG tablet Take 1,000 mg by mouth every 6 (six) hours as needed for mild pain or headache.     . butalbital-acetaminophen-caffeine (FIORICET) 50-325-40 MG tablet Take 1-2 tablets by mouth every 6 (six) hours as needed for headache. 45 tablet 4  . Doxylamine-Pyridoxine (DICLEGIS) 10-10 MG TBEC Take 1 tablet with breakfast and lunch.  Take 2 tablets at bedtime. 100 tablet 4  .  Prenatal Vit-Fe Fumarate-FA (PRENATAL MULTIVITAMIN) TABS tablet Take 1 tablet by mouth daily.      No Known Allergies  ROS:  Review of Systems  Constitutional: Negative for fever, chills and fatigue.  Respiratory: Negative for shortness of breath.   Cardiovascular: Negative for chest pain.  Gastrointestinal: Positive for abdominal pain.  Genitourinary: Negative for dysuria, flank pain, vaginal bleeding, vaginal discharge, difficulty urinating, vaginal pain and pelvic pain.  Neurological: Negative for dizziness and headaches.  Psychiatric/Behavioral: Negative.      I have reviewed patient's Past Medical Hx, Surgical Hx, Family Hx, Social Hx, medications and allergies.   Physical Exam   Patient Vitals for the past 24 hrs:  BP Temp Temp src Pulse Resp SpO2  11/13/15 1418 130/76 mmHg 98.3 F (36.8 C) Oral 81 20 -  11/13/15 0901 124/80 mmHg 98.2 F (36.8 C) Oral 78 18 98 %   Constitutional: Well-developed, well-nourished female in no acute distress.  Cardiovascular: normal rate Respiratory: normal effort GI: Abd soft, generalized tenderness, more tenderness in RLQ and right mid-abdomen with no rebound tenderness and no guarding. Pos BS x 4 MS: Extremities nontender, no edema, normal ROM Neurologic: Alert and oriented x 4.  GU: Neg CVAT.  PELVIC EXAM: Cervix pink, visually closed, without lesion, scant white creamy discharge, vaginal walls and external genitalia normal Bimanual exam: Cervix 0/long/high, firm, anterior, neg CMT, uterus nontender, nonenlarged,  adnexa without tenderness, enlargement, or mass  FHT 158 by doppler  LAB RESULTS Results for orders placed or performed during the hospital encounter of 11/13/15 (from the past 24 hour(s))  Urinalysis, Routine w reflex microscopic (not at [facility])     Status: Abnormal   Collection Time: 11/13/15  8:50 AM  Result Value Ref Range   Color, Urine YELLOW YELLOW   APPearance CLEAR CLEAR   Specific Gravity, Urine 1.015 1.005 -  1.030   pH 6.5 5.0 - 8.0   Glucose, UA NEGATIVE NEGATIVE mg/dL   Hgb urine dipstick TRACE (A) NEGATIVE   Bilirubin Urine NEGATIVE NEGATIVE   Ketones, ur NEGATIVE NEGATIVE mg/dL   Protein, ur NEGATIVE NEGATIVE mg/dL   Nitrite NEGATIVE NEGATIVE   Leukocytes, UA NEGATIVE NEGATIVE  Urine microscopic-add on     Status: Abnormal   Collection Time: 11/13/15  8:50 AM  Result Value Ref Range   Squamous Epithelial / LPF 0-5 (A) NONE SEEN   WBC, UA NONE SEEN 0 - 5 WBC/hpf   RBC / HPF 0-5 0 - 5 RBC/hpf   Bacteria, UA RARE (A) NONE SEEN  CBC     Status: Abnormal   Collection Time: 11/13/15  9:20 AM  Result Value Ref Range   WBC 9.9 4.0 - 10.5 K/uL   RBC 4.24 3.87 - 5.11 MIL/uL   Hemoglobin 11.9 (L) 12.0 - 15.0 g/dL   HCT 16.1 (L) 09.6 - 04.5 %   MCV 81.8 78.0 - 100.0 fL   MCH 28.1 26.0 - 34.0 pg   MCHC 34.3 30.0 - 36.0 g/dL   RDW 40.9 81.1 - 91.4 %   Platelets 258 150 - 400 K/uL  Amylase     Status: None   Collection Time: 11/13/15  9:20 AM  Result Value Ref Range   Amylase 63 28 - 100 U/L  Lipase, blood     Status: None   Collection Time: 11/13/15  9:20 AM  Result Value Ref Range   Lipase 31 11 - 51 U/L  Comprehensive metabolic panel     Status: Abnormal   Collection Time: 11/13/15  9:20 AM  Result Value Ref Range   Sodium 134 (L) 135 - 145 mmol/L   Potassium 3.9 3.5 - 5.1 mmol/L   Chloride 105 101 - 111 mmol/L   CO2 23 22 - 32 mmol/L   Glucose, Bld 84 65 - 99 mg/dL   BUN 11 6 - 20 mg/dL   Creatinine, Ser 7.82 0.44 - 1.00 mg/dL   Calcium 9.4 8.9 - 95.6 mg/dL   Total Protein 7.4 6.5 - 8.1 g/dL   Albumin 3.5 3.5 - 5.0 g/dL   AST 26 15 - 41 U/L   ALT 29 14 - 54 U/L   Alkaline Phosphatase 45 38 - 126 U/L   Total Bilirubin 0.3 0.3 - 1.2 mg/dL   GFR calc non Af Amer >60 >60 mL/min   GFR calc Af Amer >60 >60 mL/min   Anion gap 6 5 - 15  Wet prep, genital     Status: Abnormal   Collection Time: 11/13/15  9:50 AM  Result Value Ref Range   Yeast Wet Prep HPF POC NONE SEEN NONE  SEEN   Trich, Wet Prep NONE SEEN NONE SEEN   Clue Cells Wet Prep HPF POC NONE SEEN NONE SEEN   WBC, Wet Prep HPF POC FEW (A) NONE SEEN   Sperm NONE SEEN     O/Positive/-- (04/13 1417)  IMAGING US Abdomen Complete  11/13/2015  CLINICAL DATA:  Abdominal pain, pregnant EXAM: ABDOMEN ULTRASOUND COMPLETE COMPARISON:  None. FINDINGS: Gallbladder: 13 mm mobile gallstone. No gallbladder wall thickening or pericholecystic fluid. Negative sonographic Murphy's sign. Common bile duct: Diameter: 3 mm Liver: No focal lesion identified. Within normal limits in parenchymal echogenicity. IVC: No abnormality visualized. Pancreas: Visualized portion unremarkable. Spleen: Size and appearance within normal limits. Right Kidney: Length: 10.4 cm.  No mass or hydronephrosis. Left Kidney: Length: 10.9 cm.  No mass or hydronephrosis Abdominal aorta: No aneurysm visualized. Other findings: None. IMPRESSION: Cholelithiasis, without associated sonographic findings to suggest acute cholecystitis. Electronically Signed   By: Charline BillsSriyesh  Krishnan M.D.   On: 11/13/2015 14:06   [place] Ob Comp Less 14 Wks  11/13/2015  CLINICAL DATA:  Right lower quadrant pelvic pain, first trimester of pregnancy. EXAM: OBSTETRIC <14 WK ULTRASOUND TECHNIQUE: Transabdominal ultrasound was performed for evaluation of the gestation as well as the maternal uterus and adnexal regions. COMPARISON:  October 08, 2015. FINDINGS: Intrauterine gestational sac: Single. Yolk sac:  Not visualized. Embryo:  Visualized. Cardiac Activity: Visualized. Heart Rate: 169 bpm CRL: 60.8 mm 12 w 4 d [facility]. Subchorionic hemorrhage:  None visualized. Maternal uterus/adnexae: Ovaries appear normal. No free fluid is noted. IMPRESSION: Single live intrauterine gestation of 12 weeks 4 days. Electronically Signed   By: Lupita RaiderJames  Green Jr, M.D.   On: 11/13/2015 14:17    MAU Management/MDM: Ordered labs and reviewed results.  With no elevated WBCs and no rebound tenderness,  unlikely appendicitis.  Pt with significant abdominal tenderness and pelvic tenderness on exam.  Abdominal/pelvic US ordered.  OB US wnl.  Gallstone present on US, likely source of pt pain.   No evidence of obstruction or acute abdomen.  Pain management with Percocet 5/325, take 1-2 tabs Q 6 hours PRN x 15, eat strict low-fat diet.  F/U with Dr Verdell CarmineHarper's office as soon as possible.  Pt stable at time of discharge.  ASSESSMENT 1. Cholelithiasis affecting pregnancy in second trimester, antepartum   2. Abdominal pain affecting pregnancy, antepartum   3. Pelvic pain affecting pregnancy     PLAN Discharge home Low-fat diet Percocet PRN for pain  Return if symptoms worsen  Follow-up Information    Follow up with HARPER,CHARLES A, MD.   Specialty:  Obstetrics and Gynecology   Why:  As scheduled or sooner as needed, Return to MAU as needed for emergencies   Contact information:   [address] [place] [id number] [phone]       Sharen CounterLisa Leftwich-Kirby Certified Nurse-Midwife 11/13/2015  2:28 PM  See also incomplete student note on same date. I agree with the note by the student.  This is my full and complete documentation for today's visit.

## 2015-11-13 NOTE — Discharge Instructions (Signed)
Cholelithiasis Cholelithiasis (also called gallstones) is a form of gallbladder disease in which gallstones form in your gallbladder. The gallbladder is an organ that stores bile made in the liver, which helps digest fats. Gallstones begin as small crystals and slowly grow into stones. Gallstone pain occurs when the gallbladder spasms and a gallstone is blocking the duct. Pain can also occur when a stone passes out of the duct.  RISK FACTORS  Being female.   Having multiple pregnancies. Health care providers sometimes advise removing diseased gallbladders before future pregnancies.   Being obese.  Eating a diet heavy in fried foods and fat.   Being older than 60 years and increasing age.   Prolonged use of medicines containing female hormones.   Having diabetes mellitus.   Rapidly losing weight.   Having a family history of gallstones (heredity).  SYMPTOMS  Nausea.   Vomiting.  Abdominal pain.   Yellowing of the skin (jaundice).   Sudden pain. It may persist from several minutes to several hours.  Fever.   Tenderness to the touch. In some cases, when gallstones do not move into the bile duct, people have no pain or symptoms. These are called "silent" gallstones.  TREATMENT Silent gallstones do not need treatment. In severe cases, emergency surgery may be required. Options for treatment include:  Surgery to remove the gallbladder. This is the most common treatment.  Medicines. These do not always work and may take 6-12 months or more to work.  Shock wave treatment (extracorporeal biliary lithotripsy). In this treatment an ultrasound machine sends shock waves to the gallbladder to break gallstones into smaller pieces that can pass into the intestines or be dissolved by medicine. HOME CARE INSTRUCTIONS   Only take over-the-counter or prescription medicines for pain, discomfort, or fever as directed by your health care provider.   Follow a low-fat diet until  seen again by your health care provider. Fat causes the gallbladder to contract, which can result in pain.   Follow up with your health care provider as directed. Attacks are almost always recurrent and surgery is usually required for permanent treatment.  SEEK IMMEDIATE MEDICAL CARE IF:   Your pain increases and is not controlled by medicines.   You have a fever or persistent symptoms for more than 2-3 days.   You have a fever and your symptoms suddenly get worse.   You have persistent nausea and vomiting.  MAKE SURE YOU:   Understand these instructions.  Will watch your condition.  Will get help right away if you are not doing well or get worse.   This information is not intended to replace advice given to you by your health care provider. Make sure you discuss any questions you have with your health care provider.   Document Released: 06/30/2005 Document Revised: 03/06/2013 Document Reviewed: 12/26/2012 Elsevier Interactive Patient Education 2016 Elsevier Inc.  Low-Fat Diet for Pancreatitis or Gallbladder Conditions A low-fat diet can be helpful if you have pancreatitis or a gallbladder condition. With these conditions, your pancreas and gallbladder have trouble digesting fats. A healthy eating plan with less fat will help rest your pancreas and gallbladder and reduce your symptoms. WHAT DO I NEED TO KNOW ABOUT THIS DIET?  Eat a low-fat diet.  Reduce your fat intake to less than 20-30% of your total daily calories. This is less than 50-60 g of fat per day.  Remember that you need some fat in your diet. Ask your dietician what your daily goal should be.  Choose nonfat and low-fat healthy foods. Look for the words "nonfat," "low fat," or "fat free."  As a guide, look on the label and choose foods with less than 3 g of fat per serving. Eat only one serving.  Avoid alcohol.  Do not smoke. If you need help quitting, talk with your health care provider.  Eat small  frequent meals instead of three large heavy meals. WHAT FOODS CAN I EAT? Grains Include healthy grains and starches such as potatoes, wheat bread, fiber-rich cereal, and brown rice. Choose whole grain options whenever possible. In adults, whole grains should account for 45-65% of your daily calories.  Fruits and Vegetables Eat plenty of fruits and vegetables. Fresh fruits and vegetables add fiber to your diet. Meats and Other Protein Sources Eat lean meat such as chicken and pork. Trim any fat off of meat before cooking it. Eggs, fish, and beans are other sources of protein. In adults, these foods should account for 10-35% of your daily calories. Dairy Choose low-fat milk and dairy options. Dairy includes fat and protein, as well as calcium.  Fats and Oils Limit high-fat foods such as fried foods, sweets, baked goods, sugary drinks.  Other Creamy sauces and condiments, such as mayonnaise, can add extra fat. Think about whether or not you need to use them, or use smaller amounts or low fat options. WHAT FOODS ARE NOT RECOMMENDED?  High fat foods, such as:  [employer].  Ice cream.  [place] toast.  Sweet rolls.  Pizza.  Cheese bread.  Foods covered with batter, butter, creamy sauces, or cheese.  Fried foods.  Sugary drinks and desserts.  Foods that cause gas or bloating   This information is not intended to replace advice given to you by your health care provider. Make sure you discuss any questions you have with your health care provider.   Document Released: 07/09/2013 Document Reviewed: 07/09/2013 Elsevier Interactive Patient Education 2016 Elsevier Inc.   Abdominal Pain During Pregnancy Abdominal pain is common in pregnancy. Most of the time, it does not cause harm. There are many causes of abdominal pain. Some causes are more serious than others. Some of the causes of abdominal pain in pregnancy are easily diagnosed. Occasionally, the diagnosis takes time to  understand. Other times, the cause is not determined. Abdominal pain can be a sign that something is very wrong with the pregnancy, or the pain may have nothing to do with the pregnancy at all. For this reason, always tell your health care provider if you have any abdominal discomfort. HOME CARE INSTRUCTIONS  Monitor your abdominal pain for any changes. The following actions may help to alleviate any discomfort you are experiencing:  Do not have sexual intercourse or put anything in your vagina until your symptoms go away completely.  Get plenty of rest until your pain improves.  Drink clear fluids if you feel nauseous. Avoid solid food as long as you are uncomfortable or nauseous.  Only take over-the-counter or prescription medicine as directed by your health care provider.  Keep all follow-up appointments with your health care provider. SEEK IMMEDIATE MEDICAL CARE IF:  You are bleeding, leaking fluid, or passing tissue from the vagina.  You have increasing pain or cramping.  You have persistent vomiting.  You have painful or bloody urination.  You have a fever.  You notice a decrease in your baby's movements.  You have extreme weakness or feel faint.  You have shortness of breath, with or without abdominal pain.  You develop a severe headache with abdominal pain.  You have abnormal vaginal discharge with abdominal pain.  You have persistent diarrhea.  You have abdominal pain that continues even after rest, or gets worse. MAKE SURE YOU:   Understand these instructions.  Will watch your condition.  Will get help right away if you are not doing well or get worse.   This information is not intended to replace advice given to you by your health care provider. Make sure you discuss any questions you have with your health care provider.   Document Released: 07/04/2005 Document Revised: 04/24/2013 Document Reviewed: 01/31/2013 Elsevier Interactive Patient Education [email or web address]  Elsevier Inc.

## 2015-11-13 NOTE — MAU Note (Signed)
Pt states she started having abd cramping this morning at 0630.  Noted some vaginal spotting on arrival to MAU.

## 2015-11-14 LAB — CULTURE, OB URINE

## 2015-11-14 LAB — URINE CULTURE, OB REFLEX

## 2015-11-16 LAB — GC/CHLAMYDIA PROBE AMP (~~LOC~~) NOT AT ARMC
Chlamydia: NEGATIVE
Neisseria Gonorrhea: NEGATIVE

## 2015-11-17 ENCOUNTER — Telehealth: Payer: Self-pay | Admitting: *Deleted

## 2015-11-17 ENCOUNTER — Other Ambulatory Visit: Payer: Self-pay | Admitting: Certified Nurse Midwife

## 2015-11-17 NOTE — Progress Notes (Unsigned)
Reports having upper abdominal pain, states that it is improved.  Does not like the Percocet.  POC: LFTs/bile acids.  Desires a Friday AM appointment.  Discussed small frequent meals, low fat meals, OTC Tylenol 1000mg  Q6 hours for pain, massage, heating pad.  Patient verbalized understanding.   R.Corisa Montini CNM

## 2015-11-17 NOTE — Telephone Encounter (Signed)
Patient was calling to let Boykin ReaperRachelle know that she was seen at MAU for pain and she was diagnosed with gallstones. Call forwarded to provider.

## 2015-11-18 ENCOUNTER — Encounter (HOSPITAL_COMMUNITY): Payer: Self-pay

## 2015-11-18 ENCOUNTER — Ambulatory Visit (HOSPITAL_COMMUNITY)
Admission: RE | Admit: 2015-11-18 | Discharge: 2015-11-18 | Disposition: A | Discharge status: Home / Self Care | Payer: Medicaid Other | Source: Ambulatory Visit | Attending: Certified Nurse Midwife | Admitting: Certified Nurse Midwife

## 2015-11-18 ENCOUNTER — Other Ambulatory Visit: Payer: Self-pay | Admitting: Certified Nurse Midwife

## 2015-11-18 VITALS — BP 131/73 | HR 75 | Wt 176.8 lb

## 2015-11-18 DIAGNOSIS — O219 Vomiting of pregnancy, unspecified: Secondary | ICD-10-CM | POA: Diagnosis not present

## 2015-11-18 DIAGNOSIS — Z3682 Encounter for antenatal screening for nuchal translucency: Secondary | ICD-10-CM

## 2015-11-18 DIAGNOSIS — O0991 Supervision of high risk pregnancy, unspecified, first trimester: Secondary | ICD-10-CM

## 2015-11-18 DIAGNOSIS — Z3491 Encounter for supervision of normal pregnancy, unspecified, first trimester: Secondary | ICD-10-CM

## 2015-11-18 DIAGNOSIS — Z3A12 12 weeks gestation of pregnancy: Secondary | ICD-10-CM

## 2015-11-18 DIAGNOSIS — O09291 Supervision of pregnancy with other poor reproductive or obstetric history, first trimester: Secondary | ICD-10-CM | POA: Diagnosis not present

## 2015-11-18 DIAGNOSIS — Z36 Encounter for antenatal screening of mother: Secondary | ICD-10-CM | POA: Diagnosis not present

## 2015-11-18 DIAGNOSIS — O09891 Supervision of other high risk pregnancies, first trimester: Secondary | ICD-10-CM

## 2015-11-18 DIAGNOSIS — Z3A13 13 weeks gestation of pregnancy: Secondary | ICD-10-CM | POA: Insufficient documentation

## 2015-11-18 DIAGNOSIS — O09211 Supervision of pregnancy with history of pre-term labor, first trimester: Secondary | ICD-10-CM

## 2015-11-19 ENCOUNTER — Other Ambulatory Visit: Payer: Self-pay | Admitting: Certified Nurse Midwife

## 2015-11-20 ENCOUNTER — Ambulatory Visit (HOSPITAL_COMMUNITY)
Admission: RE | Admit: 2015-11-20 | Discharge: 2015-11-20 | Disposition: A | Discharge status: Home / Self Care | Payer: Medicaid Other | Source: Ambulatory Visit | Attending: Certified Nurse Midwife | Admitting: Certified Nurse Midwife

## 2015-11-20 ENCOUNTER — Encounter (HOSPITAL_COMMUNITY): Payer: Self-pay

## 2015-11-20 ENCOUNTER — Other Ambulatory Visit (HOSPITAL_COMMUNITY): Payer: Self-pay | Admitting: Maternal and Fetal Medicine

## 2015-11-20 DIAGNOSIS — O09291 Supervision of pregnancy with other poor reproductive or obstetric history, first trimester: Secondary | ICD-10-CM | POA: Insufficient documentation

## 2015-11-20 DIAGNOSIS — Z3682 Encounter for antenatal screening for nuchal translucency: Secondary | ICD-10-CM

## 2015-11-20 DIAGNOSIS — O09211 Supervision of pregnancy with history of pre-term labor, first trimester: Secondary | ICD-10-CM

## 2015-11-20 DIAGNOSIS — Z36 Encounter for antenatal screening of mother: Secondary | ICD-10-CM | POA: Diagnosis not present

## 2015-11-20 DIAGNOSIS — Z3A13 13 weeks gestation of pregnancy: Secondary | ICD-10-CM | POA: Diagnosis not present

## 2015-11-20 DIAGNOSIS — Z369 Encounter for antenatal screening, unspecified: Secondary | ICD-10-CM

## 2015-11-20 DIAGNOSIS — O09891 Supervision of other high risk pregnancies, first trimester: Secondary | ICD-10-CM

## 2015-11-25 ENCOUNTER — Ambulatory Visit (INDEPENDENT_AMBULATORY_CARE_PROVIDER_SITE_OTHER): Payer: Medicaid Other | Admitting: Certified Nurse Midwife

## 2015-11-25 VITALS — BP 124/81 | HR 76 | Temp 98.1°F | Wt 171.0 lb

## 2015-11-25 DIAGNOSIS — Z3482 Encounter for supervision of other normal pregnancy, second trimester: Secondary | ICD-10-CM

## 2015-11-25 DIAGNOSIS — O09212 Supervision of pregnancy with history of pre-term labor, second trimester: Secondary | ICD-10-CM

## 2015-11-25 DIAGNOSIS — O09892 Supervision of other high risk pregnancies, second trimester: Secondary | ICD-10-CM

## 2015-11-25 LAB — POCT URINALYSIS DIPSTICK
BILIRUBIN UA: NEGATIVE
GLUCOSE UA: 50
KETONES UA: NEGATIVE
Leukocytes, UA: NEGATIVE
Nitrite, UA: NEGATIVE
Protein, UA: NEGATIVE
RBC UA: NEGATIVE
Urobilinogen, UA: NEGATIVE
pH, UA: 7

## 2015-11-25 NOTE — Progress Notes (Signed)
  Subjective:    Mayer CamelStacy Lerika Vandemark is a 35 y.o. female being seen today for her obstetrical visit. She is at 5861w2d gestation. Patient reports: backache, no bleeding, no contractions, no cramping and no leaking.  Problem List Items Addressed This Visit    None    Visit Diagnoses    Encounter for supervision of other normal pregnancy in second trimester    -  Primary    Relevant Orders    POCT urinalysis dipstick (Completed)    US MFM OB DETAIL +14 WK      Patient Active Problem List   Diagnosis Date Noted  . Supervision of high risk pregnancy, antepartum 10/29/2015  . Pregnancy induced hypertension, delivered, current hospitalization 04/09/2011    Objective:     BP 124/81 mmHg  Pulse 76  Temp(Src) 98.1 F (36.7 C)  Wt 171 lb (77.565 kg)  LMP 08/17/2015 Uterine Size: Below umbilicus     Assessment:    Pregnancy @ 4761w2d  weeks Cholelithiasis      Plan:    Problem list reviewed and updated. Labs reviewed.  Follow up in 4 weeks with Dr. Clearance CootsHarper. FIRST/CF mutation testing/NIPT/QUAD SCREEN/fragile X/Ashkenazi Jewish population testing/Spinal muscular atrophy discussed: requested. Role of ultrasound in pregnancy discussed; fetal survey: ordered. Amniocentesis discussed: not indicated. 50% of 15 minute visit spent on counseling and coordination of care.

## 2015-11-26 ENCOUNTER — Other Ambulatory Visit (HOSPITAL_COMMUNITY): Payer: Self-pay

## 2015-11-28 ENCOUNTER — Encounter (HOSPITAL_COMMUNITY): Payer: Self-pay

## 2015-11-28 ENCOUNTER — Inpatient Hospital Stay (HOSPITAL_COMMUNITY)
Admission: AD | Admit: 2015-11-28 | Discharge: 2015-11-28 | Disposition: A | Discharge status: Home / Self Care | Payer: Medicaid Other | Source: Ambulatory Visit | Attending: Obstetrics | Admitting: Obstetrics

## 2015-11-28 DIAGNOSIS — Z825 Family history of asthma and other chronic lower respiratory diseases: Secondary | ICD-10-CM | POA: Insufficient documentation

## 2015-11-28 DIAGNOSIS — Z8249 Family history of ischemic heart disease and other diseases of the circulatory system: Secondary | ICD-10-CM | POA: Insufficient documentation

## 2015-11-28 DIAGNOSIS — M549 Dorsalgia, unspecified: Secondary | ICD-10-CM | POA: Insufficient documentation

## 2015-11-28 DIAGNOSIS — O26892 Other specified pregnancy related conditions, second trimester: Secondary | ICD-10-CM | POA: Insufficient documentation

## 2015-11-28 DIAGNOSIS — K8020 Calculus of gallbladder without cholecystitis without obstruction: Secondary | ICD-10-CM | POA: Diagnosis not present

## 2015-11-28 DIAGNOSIS — I10 Essential (primary) hypertension: Secondary | ICD-10-CM | POA: Diagnosis not present

## 2015-11-28 DIAGNOSIS — O99891 Other specified diseases and conditions complicating pregnancy: Secondary | ICD-10-CM

## 2015-11-28 DIAGNOSIS — R51 Headache: Secondary | ICD-10-CM | POA: Insufficient documentation

## 2015-11-28 DIAGNOSIS — R109 Unspecified abdominal pain: Secondary | ICD-10-CM | POA: Diagnosis not present

## 2015-11-28 DIAGNOSIS — O132 Gestational [pregnancy-induced] hypertension without significant proteinuria, second trimester: Secondary | ICD-10-CM | POA: Insufficient documentation

## 2015-11-28 DIAGNOSIS — O26899 Other specified pregnancy related conditions, unspecified trimester: Secondary | ICD-10-CM

## 2015-11-28 DIAGNOSIS — O9989 Other specified diseases and conditions complicating pregnancy, childbirth and the puerperium: Secondary | ICD-10-CM

## 2015-11-28 DIAGNOSIS — Z3A14 14 weeks gestation of pregnancy: Secondary | ICD-10-CM | POA: Diagnosis not present

## 2015-11-28 DIAGNOSIS — R102 Pelvic and perineal pain: Secondary | ICD-10-CM

## 2015-11-28 DIAGNOSIS — O99612 Diseases of the digestive system complicating pregnancy, second trimester: Secondary | ICD-10-CM | POA: Insufficient documentation

## 2015-11-28 DIAGNOSIS — O26612 Liver and biliary tract disorders in pregnancy, second trimester: Secondary | ICD-10-CM | POA: Diagnosis not present

## 2015-11-28 LAB — URINALYSIS, ROUTINE W REFLEX MICROSCOPIC
BILIRUBIN URINE: NEGATIVE
Glucose, UA: NEGATIVE mg/dL
Hgb urine dipstick: NEGATIVE
KETONES UR: NEGATIVE mg/dL
LEUKOCYTES UA: NEGATIVE
NITRITE: NEGATIVE
PROTEIN: NEGATIVE mg/dL
Specific Gravity, Urine: 1.02 (ref 1.005–1.030)
pH: 6.5 (ref 5.0–8.0)

## 2015-11-28 NOTE — MAU Provider Note (Signed)
History     CSN: [id number]  Arrival date and time: 11/28/15 1236   First Provider Initiated Contact with Patient 11/28/15 1307      Chief Complaint  Patient presents with  . Back Pain  . Abdominal Pain   HPI Comments: Brittany [name] is a 35 y.o. [phone] at 6128w5d presenting with abdominal pain and low back pain. She was recently diagnosed with cholelithiasis and has occasional upper back pain due to that, but states this pain is dissimilar in quality and location. She has used acetaminophen and occasional Percocet only.   Back Pain This is a new problem. The current episode started yesterday. The problem occurs constantly. The problem is unchanged. The pain is present in the lumbar spine. The quality of the pain is described as aching. The pain does not radiate. The pain is at a severity of 4/10. The pain is mild. The pain is worse during the day. The symptoms are aggravated by twisting. Stiffness is present all day. Associated symptoms include abdominal pain. Pertinent negatives include no dysuria or fever. Risk factors include pregnancy. She has tried analgesics for the symptoms. The treatment provided mild relief.  Abdominal Pain The current episode started yesterday. The onset quality is gradual. The problem occurs intermittently. The problem has been unchanged. The pain is located in the suprapubic region. The pain is at a severity of 5/10. The pain is mild. Quality: "sore" but not crampy or sharp. Pertinent negatives include no constipation, dysuria, fever, frequency, hematuria, nausea or vomiting. Nothing aggravates the pain. The pain is relieved by nothing. She has tried oral narcotic analgesics for the symptoms. The treatment provided mild relief.   She doesn't have prenatal course: She was just  Complicated by  cholelithiasisbu OB History  Gravida Para Term Preterm AB SAB TAB Ectopic Multiple Living  8 2 1 1 5 4 1  0 0 2    # Outcome Date GA Lbr Len/2nd Weight Sex Delivery Anes  PTL Lv  8 Current           7 Preterm 04/06/11 8684w6d 08:25 / 00:08 2.455 kg (5 lb 6.6 oz) F Vag-Spont None  Y  6 TAB           5 SAB           4 SAB           3 SAB           2 SAB           1 Term  [id number]    Vag-Spont          Past Medical History  Diagnosis Date  . Headache(784.0)   . Hypertension   . Pregnancy induced hypertension     Past Surgical History  Procedure Laterality Date  . Leep      Family History  Problem Relation Age of Onset  . Asthma Mother   . Hypertension Mother   . Asthma Maternal Grandmother     Social History  Substance Use Topics  . Smoking status: Never Smoker   . Smokeless tobacco: Never Used  . Alcohol Use: No    Allergies: No Known Allergies  Prescriptions prior to admission  Medication Sig Dispense Refill Last Dose  . acetaminophen (TYLENOL) 500 MG tablet Take 1,000 mg by mouth every 6 (six) hours as needed for mild pain or headache.    Taking  . butalbital-acetaminophen-caffeine (FIORICET) 50-325-40 MG tablet Take 1-2 tablets by mouth every 6 (six) hours  as needed for headache. 45 tablet 4 Taking  . Doxylamine-Pyridoxine (DICLEGIS) 10-10 MG TBEC Take 1 tablet with breakfast and lunch.  Take 2 tablets at bedtime. 100 tablet 4 Taking  . oxyCODONE-acetaminophen (PERCOCET/ROXICET) 5-325 MG tablet Take 1-2 tablets by mouth every 6 (six) hours as needed for severe pain. 15 tablet 0 Taking  . Prenatal Vit-Fe Fumarate-FA (PRENATAL MULTIVITAMIN) TABS tablet Take 1 tablet by mouth daily.    Taking    Review of Systems  Constitutional: Negative for fever.  Gastrointestinal: Positive for abdominal pain. Negative for nausea, vomiting and constipation.  Genitourinary: Negative for dysuria, frequency and hematuria.  Musculoskeletal: Positive for back pain.   Physical Exam   Blood pressure 120/81, pulse 83, temperature 97.7 F (36.5 C), resp. rate 18, height  (1.676 m), weight 79.198 kg (174 lb 9.6 oz), last menstrual period  08/17/2015.  Physical Exam  Nursing note and vitals reviewed. Constitutional: She is oriented to person, place, and time. She appears well-developed and well-nourished.  HENT:  Head: Normocephalic.  Eyes: Pupils are equal, round, and reactive to light.  Neck: Normal range of motion.  Cardiovascular: Normal rate.   Respiratory: Effort normal.  GI: Soft. There is no tenderness. There is no rebound.  Minimal suprapubic and bilateral groin pain on deep palpation  Genitourinary: Vagina normal. No vaginal discharge found.  No CVAT Bimanual shows cervix long and closed; size consistent with dates  Musculoskeletal: Normal range of motion.  Neurological: She is alert and oriented to person, place, and time.  Skin: Skin is warm and dry.  Psychiatric: She has a normal mood and affect. Her behavior is normal. Thought content normal.    MAU Course  Procedures Results for orders placed or performed during the hospital encounter of 11/28/15 (from the past 24 hour(s))  Urinalysis, Routine w reflex microscopic (not at [facility])     Status: None   Collection Time: 11/28/15 12:43 PM  Result Value Ref Range   Color, Urine YELLOW YELLOW   APPearance CLEAR CLEAR   Specific Gravity, Urine 1.020 1.005 - 1.030   pH 6.5 5.0 - 8.0   Glucose, UA NEGATIVE NEGATIVE mg/dL   Hgb urine dipstick NEGATIVE NEGATIVE   Bilirubin Urine NEGATIVE NEGATIVE   Ketones, ur NEGATIVE NEGATIVE mg/dL   Protein, ur NEGATIVE NEGATIVE mg/dL   Nitrite NEGATIVE NEGATIVE   Leukocytes, UA NEGATIVE NEGATIVE     Assessment and Plan   1. Pain of round ligament affecting pregnancy, antepartum   2. Back pain affecting pregnancy in second trimester   3. Cholelithiasis affecting pregnancy in second trimester, antepartum    Reviewed abdominal tightening and back exercises. Declines work excuse.   Medication List    TAKE these medications        acetaminophen 500 MG tablet  Commonly known as:  TYLENOL  Take 1,000 mg by mouth  every 6 (six) hours as needed for mild pain or headache.     butalbital-acetaminophen-caffeine 50-325-40 MG tablet  Commonly known as:  FIORICET  Take 1-2 tablets by mouth every 6 (six) hours as needed for headache.     Doxylamine-Pyridoxine 10-10 MG Tbec  Commonly known as:  DICLEGIS  Take 1 tablet with breakfast and lunch.  Take 2 tablets at bedtime.     oxyCODONE-acetaminophen 5-325 MG tablet  Commonly known as:  PERCOCET/ROXICET  Take 1-2 tablets by mouth every 6 (six) hours as needed for severe pain.     prenatal multivitamin Tabs tablet  Take 1 tablet by  mouth daily.       Follow-up Information    Follow up with HARPER,CHARLES A, MD On 12/23/2015.   Specialty:  Obstetrics and Gynecology   Why:  Keep your scheduled prenatal appointment   Contact information:   [address] [place] [address] [phone]       Brittany [name],Brittany [name] 11/28/2015, 1:09 PM

## 2015-11-28 NOTE — Discharge Instructions (Signed)
Back Exercises The following exercises strengthen the muscles that help to support the back. They also help to keep the lower back flexible. Doing these exercises can help to prevent back pain or lessen existing pain. If you have back pain or discomfort, try doing these exercises 2-3 times each day or as told by your health care provider. When the pain goes away, do them once each day, but increase the number of times that you repeat the steps for each exercise (do more repetitions). If you do not have back pain or discomfort, do these exercises once each day or as told by your health care provider. EXERCISES Single Knee to Chest Repeat these steps 3-5 times for each leg: Lie on your back on a firm bed or the floor with your legs extended. Bring one knee to your chest. Your other leg should stay extended and in contact with the floor. Hold your knee in place by grabbing your knee or thigh. Pull on your knee until you feel a gentle stretch in your lower back. Hold the stretch for 10-30 seconds. Slowly release and straighten your leg. Pelvic Tilt Repeat these steps 5-10 times: Lie on your back on a firm bed or the floor with your legs extended. Bend your knees so they are pointing toward the ceiling and your feet are flat on the floor. Tighten your lower abdominal muscles to press your lower back against the floor. This motion will tilt your pelvis so your tailbone points up toward the ceiling instead of pointing to your feet or the floor. With gentle tension and even breathing, hold this position for 5-10 seconds. Cat-Cow Repeat these steps until your lower back becomes more flexible: Get into a hands-and-knees position on a firm surface. Keep your hands under your shoulders, and keep your knees under your hips. You may place padding under your knees for comfort. Let your head hang down, and point your tailbone toward the floor so your lower back becomes rounded like the back of a cat. Hold this  position for 5 seconds. Slowly lift your head and point your tailbone up toward the ceiling so your back forms a sagging arch like the back of a cow. Hold this position for 5 seconds. Press-Ups Repeat these steps 5-10 times: Lie on your abdomen (face-down) on the floor. Place your palms near your head, about shoulder-width apart. While you keep your back as relaxed as possible and keep your hips on the floor, slowly straighten your arms to raise the top half of your body and lift your shoulders. Do not use your back muscles to raise your upper torso. You may adjust the placement of your hands to make yourself more comfortable. Hold this position for 5 seconds while you keep your back relaxed. Slowly return to lying flat on the floor. Bridges Repeat these steps 10 times: Lie on your back on a firm surface. Bend your knees so they are pointing toward the ceiling and your feet are flat on the floor. Tighten your buttocks muscles and lift your buttocks off of the floor until your waist is at almost the same height as your knees. You should feel the muscles working in your buttocks and the back of your thighs. If you do not feel these muscles, slide your feet 1-2 inches farther away from your buttocks. Hold this position for 3-5 seconds. Slowly lower your hips to the starting position, and allow your buttocks muscles to relax completely. If this exercise is too easy, try  doing it with your arms crossed over your chest. Abdominal Crunches Repeat these steps 5-10 times: Lie on your back on a firm bed or the floor with your legs extended. Bend your knees so they are pointing toward the ceiling and your feet are flat on the floor. Cross your arms over your chest. Tip your chin slightly toward your chest without bending your neck. Tighten your abdominal muscles and slowly raise your trunk (torso) high enough to lift your shoulder blades a tiny bit off of the floor. Avoid raising your torso higher than  that, because it can put too much stress on your low back and it does not help to strengthen your abdominal muscles. Slowly return to your starting position. Back Lifts Repeat these steps 5-10 times: Lie on your abdomen (face-down) with your arms at your sides, and rest your forehead on the floor. Tighten the muscles in your legs and your buttocks. Slowly lift your chest off of the floor while you keep your hips pressed to the floor. Keep the back of your head in line with the curve in your back. Your eyes should be looking at the floor. Hold this position for 3-5 seconds. Slowly return to your starting position. SEEK MEDICAL CARE IF: Your back pain or discomfort gets much worse when you do an exercise. Your back pain or discomfort does not lessen within 2 hours after you exercise. If you have any of these problems, stop doing these exercises right away. Do not do them again unless your health care provider says that you can. SEEK IMMEDIATE MEDICAL CARE IF: You develop sudden, severe back pain. If this happens, stop doing the exercises right away. Do not do them again unless your health care provider says that you can.   This information is not intended to replace advice given to you by your health care provider. Make sure you discuss any questions you have with your health care provider.   Document Released: 08/11/2004 Document Revised: 03/25/2015 Document Reviewed: 08/28/2014 Elsevier Interactive Patient Education 2016 [employer] Back Pain in Pregnancy Back pain during pregnancy is common. It happens in about half of all pregnancies. It is important for you and your baby that you remain active during your pregnancy.If you feel that back pain is not allowing you to remain active or sleep well, it is time to see your caregiver. Back pain may be caused by several factors related to changes during your pregnancy.Fortunately, unless you had trouble with your back before your pregnancy, the  pain is likely to get better after you deliver. Low back pain usually occurs between the fifth and seventh months of pregnancy. It can, however, happen in the first couple months. Factors that increase the risk of back problems include:   Previous back problems.  Injury to your back.  Having twins or multiple births.  A chronic cough.  Stress.  Job-related repetitive motions.  Muscle or spinal disease in the back.  Family history of back problems, ruptured (herniated) discs, or osteoporosis.  Depression, anxiety, and panic attacks. CAUSES   When you are pregnant, your body produces a hormone called relaxin. This hormonemakes the ligaments connecting the low back and pubic bones more flexible. This flexibility allows the baby to be delivered more easily. When your ligaments are loose, your muscles need to work harder to support your back. Soreness in your back can come from tired muscles. Soreness can also come from back tissues that are irritated since they are receiving less support.  As the baby grows, it puts pressure on the nerves and blood vessels in your pelvis. This can cause back pain.  As the baby grows and gets heavier during pregnancy, the uterus pushes the stomach muscles forward and changes your center of gravity. This makes your back muscles work harder to maintain good posture. SYMPTOMS  Lumbar pain during pregnancy Lumbar pain during pregnancy usually occurs at or above the waist in the center of the back. There may be pain and numbness that radiates into your leg or foot. This is similar to low back pain experienced by non-pregnant women. It usually increases with sitting for long periods of time, standing, or repetitive lifting. Tenderness may also be present in the muscles along your upper back. Posterior pelvic pain during pregnancy Pain in the back of the pelvis is more common than lumbar pain in pregnancy. It is a deep pain felt in your side at the waistline, or  across the tailbone (sacrum), or in both places. You may have pain on one or both sides. This pain can also go into the buttocks and backs of the upper thighs. Pubic and groin pain may also be present. The pain does not quickly resolve with rest, and morning stiffness may also be present. Pelvic pain during pregnancy can be brought on by most activities. A high level of fitness before and during pregnancy may or may not prevent this problem. Labor pain is usually 1 to 2 minutes apart, lasts for about 1 minute, and involves a bearing down feeling or pressure in your pelvis. However, if you are at term with the pregnancy, constant low back pain can be the beginning of early labor, and you should be aware of this. DIAGNOSIS  X-rays of the back should not be done during the first 12 to 14 weeks of the pregnancy and only when absolutely necessary during the rest of the pregnancy. MRIs do not give off radiation and are safe during pregnancy. MRIs also should only be done when absolutely necessary. HOME CARE INSTRUCTIONS  Exercise as directed by your caregiver. Exercise is the most effective way to prevent or manage back pain. If you have a back problem, it is especially important to avoid sports that require sudden body movements. Swimming and walking are great activities.  Do not stand in one place for long periods of time.  Do not wear high heels.  Sit in chairs with good posture. Use a pillow on your lower back if necessary. Make sure your head rests over your shoulders and is not hanging forward.  Try sleeping on your side, preferably the left side, with a pillow or two between your legs. If you are sore after a night's rest, your bedmay betoo soft.Try placing a board between your mattress and box spring.  Listen to your body when lifting.If you are experiencing pain, ask for help or try bending yourknees more so you can use your leg muscles rather than your back muscles. Squat down when picking  up something from the floor. Do not bend over.  Eat a healthy diet. Try to gain weight within your caregiver's recommendations.  Use heat or cold packs 3 to 4 times a day for 15 minutes to help with the pain.  Only take over-the-counter or prescription medicines for pain, discomfort, or fever as directed by your caregiver. Sudden (acute) back pain  Use bed rest for only the most extreme, acute episodes of back pain. Prolonged bed rest over 48 hours will aggravate your  condition.  Ice is very effective for acute conditions.  Put ice in a plastic bag.  Place a towel between your skin and the bag.  Leave the ice on for 10 to 20 minutes every 2 hours, or as needed.  Using heat packs for 30 minutes prior to activities is also helpful. Continued back pain See your caregiver if you have continued problems. Your caregiver can help or refer you for appropriate physical therapy. With conditioning, most back problems can be avoided. Sometimes, a more serious issue may be the cause of back pain. You should be seen right away if new problems seem to be developing. Your caregiver may recommend:  A maternity girdle.  An elastic sling.  A back brace.  A massage therapist or acupuncture. SEEK MEDICAL CARE IF:   You are not able to do most of your daily activities, even when taking the pain medicine you were given.  You need a referral to a physical therapist or chiropractor.  You want to try acupuncture. SEEK IMMEDIATE MEDICAL CARE IF:  You develop numbness, tingling, weakness, or problems with the use of your arms or legs.  You develop severe back pain that is no longer relieved with medicines.  You have a sudden change in bowel or bladder control.  You have increasing pain in other areas of the body.  You develop shortness of breath, dizziness, or fainting.  You develop nausea, vomiting, or sweating.  You have back pain which is similar to labor pains.  You have back pain along  with your water breaking or vaginal bleeding.  You have back pain or numbness that travels down your leg.  Your back pain developed after you fell.  You develop pain on one side of your back. You may have a kidney stone.  You see blood in your urine. You may have a bladder infection or kidney stone.  You have back pain with blisters. You may have shingles. Back pain is fairly common during pregnancy but should not be accepted as just part of the process. Back pain should always be treated as soon as possible. This will make your pregnancy as pleasant as possible.   This information is not intended to replace advice given to you by your health care provider. Make sure you discuss any questions you have with your health care provider.   Document Released: 10/12/2005 Document Revised: 09/26/2011 Document Reviewed: 11/23/2010 Elsevier Interactive Patient Education 2016 Elsevier Inc. Round Ligament Pain The round ligament is a cord of muscle and tissue that helps to support the uterus. It can become a source of pain during pregnancy if it becomes stretched or twisted as the baby grows. The pain usually begins in the second trimester of pregnancy, and it can come and go until the baby is delivered. It is not a serious problem, and it does not cause harm to the baby. Round ligament pain is usually a short, sharp, and pinching pain, but it can also be a dull, lingering, and aching pain. The pain is felt in the lower side of the abdomen or in the groin. It usually starts deep in the groin and moves up to the outside of the hip area. Pain can occur with:  A sudden change in position.  Rolling over in bed.  Coughing or sneezing.  Physical activity. HOME CARE INSTRUCTIONS Watch your condition for any changes. Take these steps to help with your pain:  When the pain starts, relax. Then try:  Sitting down.  Flexing your knees up to your abdomen.  Lying on your side with one pillow under your  abdomen and another pillow between your legs.  Sitting in a warm bath for 15-20 minutes or until the pain goes away.  Take over-the-counter and prescription medicines only as told by your health care provider.  Move slowly when you sit and stand.  Avoid long walks if they cause pain.  Stop or lessen your physical activities if they cause pain. SEEK MEDICAL CARE IF:  Your pain does not go away with treatment.  You feel pain in your back that you did not have before.  Your medicine is not helping. SEEK IMMEDIATE MEDICAL CARE IF:  You develop a fever or chills.  You develop uterine contractions.  You develop vaginal bleeding.  You develop nausea or vomiting.  You develop diarrhea.  You have pain when you urinate.   This information is not intended to replace advice given to you by your health care provider. Make sure you discuss any questions you have with your health care provider.   Document Released: 04/12/2008 Document Revised: 09/26/2011 Document Reviewed: 09/10/2014 Elsevier Interactive Patient Education [employer] Inc[employer]

## 2015-11-28 NOTE — MAU Note (Addendum)
Onset of back pain since last night,had BM this morning then had onset of right side pain, denies constipation, no dysuria, no vaginal bleeding, having milky discharge no odor. Patient was diagnosed with gallstones about 2 weeks ago.

## 2015-12-06 ENCOUNTER — Inpatient Hospital Stay (HOSPITAL_COMMUNITY)
Admission: AD | Admit: 2015-12-06 | Discharge: 2015-12-06 | Disposition: A | Discharge status: Home / Self Care | Payer: Medicaid Other | Source: Ambulatory Visit | Attending: Obstetrics | Admitting: Obstetrics

## 2015-12-06 ENCOUNTER — Encounter (HOSPITAL_COMMUNITY): Payer: Self-pay | Admitting: *Deleted

## 2015-12-06 DIAGNOSIS — M549 Dorsalgia, unspecified: Secondary | ICD-10-CM | POA: Insufficient documentation

## 2015-12-06 DIAGNOSIS — O26892 Other specified pregnancy related conditions, second trimester: Secondary | ICD-10-CM | POA: Diagnosis not present

## 2015-12-06 DIAGNOSIS — O99891 Other specified diseases and conditions complicating pregnancy: Secondary | ICD-10-CM

## 2015-12-06 DIAGNOSIS — O9989 Other specified diseases and conditions complicating pregnancy, childbirth and the puerperium: Secondary | ICD-10-CM

## 2015-12-06 DIAGNOSIS — Z3A15 15 weeks gestation of pregnancy: Secondary | ICD-10-CM | POA: Insufficient documentation

## 2015-12-06 DIAGNOSIS — R51 Headache: Secondary | ICD-10-CM | POA: Diagnosis present

## 2015-12-06 LAB — URINALYSIS, ROUTINE W REFLEX MICROSCOPIC
BILIRUBIN URINE: NEGATIVE
GLUCOSE, UA: NEGATIVE mg/dL
HGB URINE DIPSTICK: NEGATIVE
Ketones, ur: NEGATIVE mg/dL
Leukocytes, UA: NEGATIVE
Nitrite: NEGATIVE
PH: 6.5 (ref 5.0–8.0)
Protein, ur: NEGATIVE mg/dL
SPECIFIC GRAVITY, URINE: 1.015 (ref 1.005–1.030)

## 2015-12-06 MED ORDER — IBUPROFEN 600 MG PO TABS: 600.0000 mg | ORAL_TABLET | Freq: Four times a day (QID) | ORAL | Status: DC | PRN

## 2015-12-06 MED ORDER — IBUPROFEN 600 MG PO TABS
600.0000 mg | ORAL_TABLET | Freq: Once | ORAL | Status: AC
  Administered 2015-12-06: 600 mg via ORAL
  Filled 2015-12-06: qty 1

## 2015-12-06 NOTE — Discharge Instructions (Signed)
Back Pain in Pregnancy °Back pain during pregnancy is common. It happens in about half of all pregnancies. It is important for you and your baby that you remain active during your pregnancy. If you feel that back pain is not allowing you to remain active or sleep well, it is time to see your caregiver. Back pain may be caused by several factors related to changes during your pregnancy. Fortunately, unless you had trouble with your back before your pregnancy, the pain is likely to get better after you deliver. °Low back pain usually occurs between the fifth and seventh months of pregnancy. It can, however, happen in the first couple months. Factors that increase the risk of back problems include:  °· Previous back problems. °· Injury to your back. °· Having twins or multiple births. °· A chronic cough. °· Stress. °· Job-related repetitive motions. °· Muscle or spinal disease in the back. °· Family history of back problems, ruptured (herniated) discs, or osteoporosis. °· Depression, anxiety, and panic attacks. °CAUSES  °· When you are pregnant, your body produces a hormone called relaxin. This hormone makes the ligaments connecting the low back and pubic bones more flexible. This flexibility allows the baby to be delivered more easily. When your ligaments are loose, your muscles need to work harder to support your back. Soreness in your back can come from tired muscles. Soreness can also come from back tissues that are irritated since they are receiving less support. °· As the baby grows, it puts pressure on the nerves and blood vessels in your pelvis. This can cause back pain. °· As the baby grows and gets heavier during pregnancy, the uterus pushes the stomach muscles forward and changes your center of gravity. This makes your back muscles work harder to maintain good posture. °SYMPTOMS  °Lumbar pain during pregnancy °Lumbar pain during pregnancy usually occurs at or above the waist in the center of the back. There  may be pain and numbness that radiates into your leg or foot. This is similar to low back pain experienced by non-pregnant women. It usually increases with sitting for long periods of time, standing, or repetitive lifting. Tenderness may also be present in the muscles along your upper back. °Posterior pelvic pain during pregnancy °Pain in the back of the pelvis is more common than lumbar pain in pregnancy. It is a deep pain felt in your side at the waistline, or across the tailbone (sacrum), or in both places. You may have pain on one or both sides. This pain can also go into the buttocks and backs of the upper thighs. Pubic and groin pain may also be present. The pain does not quickly resolve with rest, and morning stiffness may also be present. °Pelvic pain during pregnancy can be brought on by most activities. A high level of fitness before and during pregnancy may or may not prevent this problem. Labor pain is usually 1 to 2 minutes apart, lasts for about 1 minute, and involves a bearing down feeling or pressure in your pelvis. However, if you are at term with the pregnancy, constant low back pain can be the beginning of early labor, and you should be aware of this. °DIAGNOSIS  °X-rays of the back should not be done during the first 12 to 14 weeks of the pregnancy and only when absolutely necessary during the rest of the pregnancy. MRIs do not give off radiation and are safe during pregnancy. MRIs also should only be done when absolutely necessary. °HOME CARE INSTRUCTIONS °· Exercise   as directed by your caregiver. Exercise is the most effective way to prevent or manage back pain. If you have a back problem, it is especially important to avoid sports that require sudden body movements. Swimming and walking are great activities. °· Do not stand in one place for long periods of time. °· Do not wear high heels. °· Sit in chairs with good posture. Use a pillow on your lower back if necessary. Make sure your head  rests over your shoulders and is not hanging forward. °· Try sleeping on your side, preferably the left side, with a pillow or two between your legs. If you are sore after a night's rest, your bed may be too soft. Try placing a board between your mattress and box spring. °· Listen to your body when lifting. If you are experiencing pain, ask for help or try bending your knees more so you can use your leg muscles rather than your back muscles. Squat down when picking up something from the floor. Do not bend over. °· Eat a healthy diet. Try to gain weight within your caregiver's recommendations. °· Use heat or cold packs 3 to 4 times a day for 15 minutes to help with the pain. °· Only take over-the-counter or prescription medicines for pain, discomfort, or fever as directed by your caregiver. °Sudden (acute) back pain °· Use bed rest for only the most extreme, acute episodes of back pain. Prolonged bed rest over 48 hours will aggravate your condition. °· Ice is very effective for acute conditions. °¨ Put ice in a plastic bag. °¨ Place a towel between your skin and the bag. °¨ Leave the ice on for 10 to 20 minutes every 2 hours, or as needed. °· Using heat packs for 30 minutes prior to activities is also helpful. °Continued back pain °See your caregiver if you have continued problems. Your caregiver can help or refer you for appropriate physical therapy. With conditioning, most back problems can be avoided. Sometimes, a more serious issue may be the cause of back pain. You should be seen right away if new problems seem to be developing. Your caregiver may recommend: °· A maternity girdle. °· An elastic sling. °· A back brace. °· A massage therapist or acupuncture. °SEEK MEDICAL CARE IF:  °· You are not able to do most of your daily activities, even when taking the pain medicine you were given. °· You need a referral to a physical therapist or chiropractor. °· You want to try acupuncture. °SEEK IMMEDIATE MEDICAL CARE  IF: °· You develop numbness, tingling, weakness, or problems with the use of your arms or legs. °· You develop severe back pain that is no longer relieved with medicines. °· You have a sudden change in bowel or bladder control. °· You have increasing pain in other areas of the body. °· You develop shortness of breath, dizziness, or fainting. °· You develop nausea, vomiting, or sweating. °· You have back pain which is similar to labor pains. °· You have back pain along with your water breaking or vaginal bleeding. °· You have back pain or numbness that travels down your leg. °· Your back pain developed after you fell. °· You develop pain on one side of your back. You may have a kidney stone. °· You see blood in your urine. You may have a bladder infection or kidney stone. °· You have back pain with blisters. You may have shingles. °Back pain is fairly common during pregnancy but should not be accepted as just part of   the process. Back pain should always be treated as soon as possible. This will make your pregnancy as pleasant as possible. °  °This information is not intended to replace advice given to you by your health care provider. Make sure you discuss any questions you have with your health care provider. °  °Document Released: 10/12/2005 Document Revised: 09/26/2011 Document Reviewed: 11/23/2010 °Elsevier Interactive Patient Education ©2016 [employer] Inc[employer] ° ° °General Headache Without Cause °A headache is pain or discomfort felt around the head or neck area. The specific cause of a headache may not be found. There are many causes and types of headaches. A few common ones are: °· Tension headaches. °· Migraine headaches. °· Cluster headaches. °· Chronic daily headaches. °HOME CARE INSTRUCTIONS  °Watch your condition for any changes. Take these steps to help with your condition: °Managing Pain °· Take over-the-counter and prescription medicines only as told by your health care provider. °· Lie down in a dark,  quiet room when you have a headache. °· If directed, apply ice to the head and neck area: °¨ Put ice in a plastic bag. °¨ Place a towel between your skin and the bag. °¨ Leave the ice on for 20 minutes, 2-3 times per day. °· Use a heating pad or hot shower to apply heat to the head and neck area as told by your health care provider. °· Keep lights dim if bright lights bother you or make your headaches worse. °Eating and Drinking °· Eat meals on a regular schedule. °· Limit alcohol use. °· Decrease the amount of caffeine you drink, or stop drinking caffeine. °General Instructions °· Keep all follow-up visits as told by your health care provider. This is important. °· Keep a headache journal to help find out what may trigger your headaches. For example, write down: °¨ What you eat and drink. °¨ How much sleep you get. °¨ Any change to your diet or medicines. °· Try massage or other relaxation techniques. °· Limit stress. °· Sit up straight, and do not tense your muscles. °· Do not use tobacco products, including cigarettes, chewing tobacco, or e-cigarettes. If you need help quitting, ask your health care provider. °· Exercise regularly as told by your health care provider. °· Sleep on a regular schedule. Get 7-9 hours of sleep, or the amount recommended by your health care provider. °SEEK MEDICAL CARE IF:  °· Your symptoms are not helped by medicine. °· You have a headache that is different from the usual headache. °· You have nausea or you vomit. °· You have a fever. °SEEK IMMEDIATE MEDICAL CARE IF:  °· Your headache becomes severe. °· You have repeated vomiting. °· You have a stiff neck. °· You have a loss of vision. °· You have problems with speech. °· You have pain in the eye or ear. °· You have muscular weakness or loss of muscle control. °· You lose your balance or have trouble walking. °· You feel faint or pass out. °· You have confusion. °  °This information is not intended to replace advice given to you by  your health care provider. Make sure you discuss any questions you have with your health care provider. °  °Document Released: 07/04/2005 Document Revised: 03/25/2015 Document Reviewed: 10/27/2014 °Elsevier Interactive Patient Education ©2016 Elsevier Inc. ° °

## 2015-12-06 NOTE — MAU Note (Signed)
Pt states she is having lower back pain and headaches.  Pt states she is taking Tylenol and it is not working.

## 2015-12-06 NOTE — MAU Provider Note (Signed)
Chief Complaint: Back Pain   First Provider Initiated Contact with Patient 12/06/15 1622     SUBJECTIVE HPI: Brittany [name] is a 35 y.o. [id number] at [redacted]w[redacted]d who presents to [facility] reporting HA and LBP. Has been seen in [facility] and [facility] for these issue multiple times in the past month.   Headache Location: Frontal  Quality: throbbing Severity: 9/10 on pain scale Duration: <24 hours this episode. Has occasional HA's when not pregnant, but more frequent since this pregnancy started. Context: None Course: unchanged Timing: Constant Modifying factors: No improvement w/ Tylenol. Has taking Fioricet in the past w/ improvement, but states the Fioricet makes her feel weird and she is worried about taking a lot of Percocet in pregnancy. Associated signs and symptoms: Pos for seeing brown spots this afternoon and feeling hot. Neg for chills, weakness, neck stiffness, difficulties w/ speech or gait.   Low back Location: midline, sacral Quality: sharp, soreness Severity: 7/10 on pain scale Duration: few weeks Context: None. Denies recent change in activities, falls, injuries.  Timing: constant Modifying factors: No improvement w/ Tylenol. Resolves w/ percocet. No relationship to position.  Associated signs and symptoms: Neg for radiation down legs, numbness, difficulties w/ gait.  Past Medical History  Diagnosis Date  . Headache(784.0)   . Hypertension   . Pregnancy induced hypertension    OB History  Gravida Para Term Preterm AB SAB TAB Ectopic Multiple Living  0 0 2    # Outcome Date GA Lbr Len/2nd Weight Sex Delivery Anes PTL Lv  8 Current           7 Preterm 04/06/11 [redacted]w[redacted]d 08:25 / 00:08 5 lb 6.6 oz (2.455 kg) F Vag-Spont None  Y  6 TAB           5 SAB           4 SAB           3 SAB           2 SAB           1 Term  [redacted]w[redacted]d    Vag-Spont        Past Surgical History  Procedure Laterality Date  . Leep     Social History   Social History  .  Marital Status: Married    Spouse Name: N/A  . Number of Children: N/A  . Years of Education: N/A   Occupational History  . Not on file.   Social History Main Topics  . Smoking status: Never Smoker   . Smokeless tobacco: Never Used  . Alcohol Use: No  . Drug Use: No  . Sexual Activity: Yes     Comment: last intercourse 09/24/15   Other Topics Concern  . Not on file   Social History Narrative   No current facility-administered medications on file prior to encounter.   Current Outpatient Prescriptions on File Prior to Encounter  Medication Sig Dispense Refill  . acetaminophen (TYLENOL) 500 MG tablet Take 1,000 mg by mouth every 6 (six) hours as needed for mild pain or headache.     . butalbital-acetaminophen-caffeine (FIORICET) 50-325-40 MG tablet Take 1-2 tablets by mouth every 6 (six) hours as needed for headache. 45 tablet 4  . Prenatal Vit-Fe Fumarate-FA (PRENATAL MULTIVITAMIN) TABS tablet Take 1 tablet by mouth daily.     Marland Kitchen oxyCODONE-acetaminophen (PERCOCET/ROXICET) 5-325 MG tablet Take 1-2 tablets by mouth every 6 (six) hours as needed for  severe pain. (Patient not taking: Reported on 12/06/2015) 15 tablet 0   No Known Allergies  I have reviewed the past Medical Hx, Surgical Hx, Social Hx, Allergies and Medications.   Review of Systems  Constitutional: Positive for chills. Negative for fever.  HENT: Positive for congestion. Negative for ear pain and sinus pressure.   Eyes: Positive for visual disturbance. Negative for photophobia and pain.  Gastrointestinal: Negative for abdominal pain.  Genitourinary: Negative for dysuria, flank pain and vaginal bleeding.  Musculoskeletal: Positive for back pain. Negative for myalgias, gait problem, neck pain and neck stiffness.  Neurological: Positive for headaches. Negative for dizziness, facial asymmetry, speech difficulty, weakness and numbness.    OBJECTIVE Patient Vitals for the past 24 hrs:  BP Temp Temp src Pulse Resp Height  Weight  12/06/15 1604 115/66 mmHg 97.8 F (36.6 C) Oral 81 16 - -  12/06/15 1558 - - - - - 5\' 6"  (1.676 m) 176 lb 12.8 oz (80.196 kg)   Constitutional: Well-developed, well-nourished female in no acute distress. Slightly injected conjunctiva. Cardiovascular: normal rate Respiratory: normal rate and effort.  GI: Abd soft, non-tender, gravid appropriate for gestational age.  Neurologic: Alert and oriented x 4. Normal speech and gait. No facial droop. Grossly equal sensation and mvmt bilat upper and lower extremities.  MS: Mild sacral tenderness. Normal ROM. No warmth or erythema.  GU: Neg CVAT.  PELVIC EXAM: Cervix long and closed. No bleeding. Physiologic discharge.  LAB RESULTS Results for orders placed or performed during the hospital encounter of 12/06/15 (from the past 24 hour(s))  Urinalysis, Routine w reflex microscopic (not at [facility])     Status: None   Collection Time: 12/06/15  3:55 PM  Result Value Ref Range   Color, Urine YELLOW YELLOW   APPearance CLEAR CLEAR   Specific Gravity, Urine 1.015 1.005 - 1.030   pH 6.5 5.0 - 8.0   Glucose, UA NEGATIVE NEGATIVE mg/dL   Hgb urine dipstick NEGATIVE NEGATIVE   Bilirubin Urine NEGATIVE NEGATIVE   Ketones, ur NEGATIVE NEGATIVE mg/dL   Protein, ur NEGATIVE NEGATIVE mg/dL   Nitrite NEGATIVE NEGATIVE   Leukocytes, UA NEGATIVE NEGATIVE    IMAGING NA  MAU COURSE UA, Ibuprofen.   Pain decreased to 7/10. Also patient felt like she needed different pain medication or needed evaluation ED for headache. She states Fioricet works, so she will take it at home. Does not have a ride home from MAU so she cannot take anything sedating. Denies feeling as if she needs to go to [facility] emergency room for further evaluation of headache.  Discuss history, exam, response ibuprofen with Dr. Clearance Coots. Agrees with plan of care. Recommends patient call office tomorrow for referral to headache specialist.  MDM - 35 year old female 15 weeks 6 days gestation  with exacerbation of chronic headaches identical to what she experienced in previous pregnancy. Gastrografin any other emergent intracranial process  - Musculoskeletal low back pain.    ASSESSMENT 1. Headache in pregnancy, antepartum, second trimester   2. Back pain affecting pregnancy in second trimester     PLAN Discharge home in stable condition. Headache red flags reviewed. Comfort measures and stretches for low back pain discussed.      Follow-up Information    Follow up with [facility] On 12/23/2015.   Why:  Routine prenatal visit or sooner as needed if symptoms worsen   Contact information:   [address] [place] [address] [phone]  Call [facility].   Why:  To make referral for headache specialist   Contact information:   [address] [place] [phone]      Follow up with [facility].   Specialty:  Emergency Medicine   Why:  For headache emergencies (accompanied by fever greater than 100.4, severe neck pain, difficulty speaking or walking, one-sided weakness)   Contact information:   [address] [id number] mc [place] [id number] [phone]      [facility].   Specialty:  Obstetrics and Gynecology   Why:  Nada MaclachlanKaren Teague-Clark, headache specialist   Contact information:   [address] [place] [id number] [phone]       Medication List    STOP taking these medications        oxyCODONE-acetaminophen 5-325 MG tablet  Commonly known as:  PERCOCET/ROXICET      TAKE these medications        acetaminophen 500 MG tablet  Commonly known as:  TYLENOL  Take 1,000 mg by mouth every 6 (six) hours as needed for mild pain or headache.     butalbital-acetaminophen-caffeine 50-325-40 MG tablet  Commonly known as:   FIORICET  Take 1-2 tablets by mouth every 6 (six) hours as needed for headache.     DICLEGIS 10-10 MG Tbec  Generic drug:  Doxylamine-Pyridoxine  Take 1-2 tablets by mouth 3 (three) times daily. Pt takes one tablet twice daily in the morning and at noon and two tablets at bedtime.     ibuprofen 600 MG tablet  Commonly known as:  ADVIL,MOTRIN  Take 1 tablet (600 mg total) by mouth every 6 (six) hours as needed for headache or moderate pain.     prenatal multivitamin Tabs tablet  Take 1 tablet by mouth daily.       [place] Sherial Ebrahim, [place] 12/06/2015  6:41 PM

## 2015-12-07 ENCOUNTER — Ambulatory Visit: Payer: Medicaid Other | Admitting: "Endocrinology

## 2015-12-09 ENCOUNTER — Encounter: Payer: Self-pay | Admitting: "Endocrinology

## 2015-12-09 ENCOUNTER — Ambulatory Visit (INDEPENDENT_AMBULATORY_CARE_PROVIDER_SITE_OTHER): Payer: Medicaid Other | Admitting: "Endocrinology

## 2015-12-09 VITALS — BP 128/78 | HR 82 | Ht 66.0 in | Wt 174.0 lb

## 2015-12-09 DIAGNOSIS — E0590 Thyrotoxicosis, unspecified without thyrotoxic crisis or storm: Secondary | ICD-10-CM

## 2015-12-09 HISTORY — DX: Thyrotoxicosis, unspecified without thyrotoxic crisis or storm: E05.90

## 2015-12-09 LAB — T3, FREE: T3 FREE: 2.6 pg/mL (ref 2.3–4.2)

## 2015-12-09 LAB — TSH: TSH: 0.8 m[IU]/L

## 2015-12-09 LAB — T4, FREE: FREE T4: 1.1 ng/dL (ref 0.8–1.8)

## 2015-12-09 NOTE — Progress Notes (Signed)
Subjective:    Patient ID: Brittany [name], female    DOB: January 30, 1981, PCP No PCP Per Patient   Past Medical History  Diagnosis Date  . Headache(784.0)   . Hypertension   . Pregnancy induced hypertension    Past Surgical History  Procedure Laterality Date  . Leep     Social History   Social History  . Marital Status: Married    Spouse Name: N/A  . Number of Children: N/A  . Years of Education: N/A   Social History Main Topics  . Smoking status: Never Smoker   . Smokeless tobacco: Never Used  . Alcohol Use: No  . Drug Use: No  . Sexual Activity: Yes     Comment: last intercourse 09/24/15   Other Topics Concern  . None   Social History Narrative   Outpatient Encounter Prescriptions as of 12/09/2015  Medication Sig  . acetaminophen (TYLENOL) 500 MG tablet Take 1,000 mg by mouth every 6 (six) hours as needed for mild pain or headache.   . butalbital-acetaminophen-caffeine (FIORICET) 50-325-40 MG tablet Take 1-2 tablets by mouth every 6 (six) hours as needed for headache.  . Doxylamine-Pyridoxine (DICLEGIS) 10-10 MG TBEC Take 1-2 tablets by mouth 3 (three) times daily. Pt takes one tablet twice daily in the morning and at noon and two tablets at bedtime.  Marland Kitchen. ibuprofen (ADVIL,MOTRIN) 600 MG tablet Take 1 tablet (600 mg total) by mouth every 6 (six) hours as needed for headache or moderate pain.  . Prenatal Vit-Fe Fumarate-FA (PRENATAL MULTIVITAMIN) TABS tablet Take 1 tablet by mouth daily.    No facility-administered encounter medications on file as of 12/09/2015.   ALLERGIES: No Known Allergies VACCINATION STATUS: Immunization History  Administered Date(s) Administered  . Tdap 04/07/2011    HPI  35 year old female patient with medical history as above. She is being seen in consultation for abnormal thyroid function test during pregnancy at 16 weeks. -She denies any prior history of thyroid dysfunction. She denies any family history of thyroid problem nor thyroid  cancer. On 10/29/2015 she was found to have TSH of 0.155 at approximately 10th week of her pregnancy. -She denies heat intolerance, palpitations, tremors. -She complains of sleepiness. -She denies any exposure to thyroid hormones and she is taking prenatal vitamins and Tylenol on and off for pain control. -She is consistent in following up for her prenatal care.    Review of Systems Constitutional: + weight gain, no fatigue, no subjective hyperthermia/hypothermia Eyes: no blurry vision, no xerophthalmia ENT: no sore throat, no nodules palpated in throat, no dysphagia/odynophagia, no hoarseness Cardiovascular: no CP/SOB/palpitations/leg swelling Respiratory: no cough/SOB Gastrointestinal: no N/V/D/C Musculoskeletal: no muscle/joint aches Skin: no rashes Neurological: no tremors/numbness/tingling/dizziness, feels sleepy Psychiatric: no depression/anxiety  Objective:    BP 128/78 mmHg  Pulse 82  Ht 5\' 6"  (1.676 m)  Wt 174 lb (78.926 kg)  BMI 28.10 kg/m2  SpO2 97%  LMP 08/17/2015  Wt Readings from Last 3 Encounters:  12/09/15 174 lb (78.926 kg)  12/06/15 176 lb 12.8 oz (80.196 kg)  11/28/15 174 lb 9.6 oz (79.198 kg)    Physical Exam  Constitutional: Pregnant with her third child at 16 weeks, in NAD Eyes: PERRLA, EOMI, no exophthalmos ENT: moist mucous membranes, no thyromegaly, no cervical lymphadenopathy Cardiovascular: RRR, No MRG Respiratory: CTA B Gastrointestinal: abdomen soft, NT, ND, BS+ Musculoskeletal: no deformities, strength intact in all 4 Skin: moist, warm, no rashes Neurological: no tremor with outstretched hands, DTR normal in all 4  CMP     Component Value Date/Time   NA 134* 11/13/2015 0920   K 3.9 11/13/2015 0920   CL 105 11/13/2015 0920   CO2 23 11/13/2015 0920   GLUCOSE 84 11/13/2015 0920   BUN 11 11/13/2015 0920   CREATININE 0.64 11/13/2015 0920   CALCIUM 9.4 11/13/2015 0920   PROT 7.4 11/13/2015 0920   ALBUMIN 3.5 11/13/2015 0920   AST 26  11/13/2015 0920   ALT 29 11/13/2015 0920   ALKPHOS 45 11/13/2015 0920   BILITOT 0.3 11/13/2015 0920   GFRNONAA >60 11/13/2015 0920   GFRAA >60 11/13/2015 0920     On 10/29/2015 her TSH was 0.155, total T4 13, and free thyroxine index 2.6.     Assessment & Plan:   1. Subclinical hyperthyroidism -  I have reviewed her available thyroid workup and evaluated patient clinically.  -This is likely gestational transient thyrotoxicosis induced by hCG. She is pregnant at [facility] of 16 weeks. First trimester TSH target is between 0.1- 2.5, second trimester 0.2-3.0,  hers is at 0.155 associated with normal  T4 of 13 and free thyroxine index of 2.6. She is clinically euthyroid. She would not require antithyroid intervention for now. I will obtain repeat labs today for thyroid-stimulating immunoglobulin, free T4, free T3, and TSH. She will return in 1 week for reevaluation. -If antithyroid therapy is required , it will be methimazole since she has passed the first trimester.  - I advised patient to maintain close follow up with OB/GYN for her prenatal care needs. Follow up plan: Return in about 1 week (around 12/16/2015) for labs today.  Marquis Lunch, MD Phone: [phone]  Fax: [phone]   12/09/2015, 11:56 AM

## 2015-12-10 LAB — THYROGLOBULIN ANTIBODY: Thyroglobulin Ab: <1 IU/mL (ref <2)

## 2015-12-10 LAB — THYROID PEROXIDASE ANTIBODY: THYROID PEROXIDASE ANTIBODY: 2 [IU]/mL (ref <9)

## 2015-12-15 ENCOUNTER — Inpatient Hospital Stay (HOSPITAL_COMMUNITY)
Admission: AD | Admit: 2015-12-15 | Discharge: 2015-12-15 | Disposition: A | Discharge status: Home / Self Care | Payer: Medicaid Other | Source: Ambulatory Visit | Attending: Obstetrics | Admitting: Obstetrics

## 2015-12-15 ENCOUNTER — Encounter (HOSPITAL_COMMUNITY): Payer: Self-pay | Admitting: *Deleted

## 2015-12-15 DIAGNOSIS — Z825 Family history of asthma and other chronic lower respiratory diseases: Secondary | ICD-10-CM | POA: Insufficient documentation

## 2015-12-15 DIAGNOSIS — O26892 Other specified pregnancy related conditions, second trimester: Secondary | ICD-10-CM | POA: Diagnosis not present

## 2015-12-15 DIAGNOSIS — O210 Mild hyperemesis gravidarum: Secondary | ICD-10-CM | POA: Diagnosis not present

## 2015-12-15 DIAGNOSIS — Z8249 Family history of ischemic heart disease and other diseases of the circulatory system: Secondary | ICD-10-CM | POA: Insufficient documentation

## 2015-12-15 DIAGNOSIS — R51 Headache: Secondary | ICD-10-CM | POA: Diagnosis not present

## 2015-12-15 DIAGNOSIS — R110 Nausea: Secondary | ICD-10-CM | POA: Insufficient documentation

## 2015-12-15 DIAGNOSIS — Z3A17 17 weeks gestation of pregnancy: Secondary | ICD-10-CM | POA: Insufficient documentation

## 2015-12-15 DIAGNOSIS — R519 Headache, unspecified: Secondary | ICD-10-CM

## 2015-12-15 DIAGNOSIS — O26899 Other specified pregnancy related conditions, unspecified trimester: Secondary | ICD-10-CM

## 2015-12-15 HISTORY — DX: Unspecified abnormal cytological findings in specimens from vagina: R87.629

## 2015-12-15 HISTORY — DX: Calculus of gallbladder without cholecystitis without obstruction: K80.20

## 2015-12-15 LAB — URINALYSIS, ROUTINE W REFLEX MICROSCOPIC
Bilirubin Urine: NEGATIVE
GLUCOSE, UA: NEGATIVE mg/dL
Hgb urine dipstick: NEGATIVE
KETONES UR: NEGATIVE mg/dL
LEUKOCYTES UA: NEGATIVE
Nitrite: NEGATIVE
PH: 6.5 (ref 5.0–8.0)
Protein, ur: NEGATIVE mg/dL
SPECIFIC GRAVITY, URINE: 1.01 (ref 1.005–1.030)

## 2015-12-15 LAB — THYROID STIMULATING IMMUNOGLOBULIN

## 2015-12-15 NOTE — MAU Provider Note (Signed)
History     CSN: [id number]  Arrival date and time: 12/15/15 7829  First Provider Initiated Contact with Patient 12/15/15 [phone]      Chief Complaint  Patient presents with  . "HA, cold, nausea"    HPI Comments: [id number]  c/o constant frontal HA onset around 0400. Rates pain 9/10. She took Tylenol at that time with no response then took Motrin about 4 hrs later and HA persisted. She also c/o nausea since HA onset, but no vomiting. She was seen 9 days ago for similar sx and given Fioricet which she reports effective for HA but did not take any today. Reports intermittent nausea since early pregnancy for which she uses Diclegis. Denies VB, cramping/pian, or LOF, reports +FM. Was given info for headache specialist, has not called for appt yet.   OB History    Gravida Para Term Preterm AB TAB SAB Ectopic Multiple Living   0 0 2      Past Medical History  Diagnosis Date  . Headache(784.0)   . Hypertension   . Pregnancy induced hypertension   . Vaginal Pap smear, abnormal   . Gallstone     Past Surgical History  Procedure Laterality Date  . Leep      Family History  Problem Relation Age of Onset  . Asthma Mother   . Hypertension Mother   . Asthma Maternal Grandmother     Social History  Substance Use Topics  . Smoking status: Never Smoker   . Smokeless tobacco: Never Used  . Alcohol Use: No    Allergies: No Known Allergies  Prescriptions prior to admission  Medication Sig Dispense Refill Last Dose  . acetaminophen (TYLENOL) 500 MG tablet Take 1,000 mg by mouth every 6 (six) hours as needed for mild pain or headache.    12/15/2015 at Unknown time  . butalbital-acetaminophen-caffeine (FIORICET) 50-325-40 MG tablet Take 1-2 tablets by mouth every 6 (six) hours as needed for headache. 45 tablet 4 12/14/2015 at Unknown time  . Doxylamine-Pyridoxine (DICLEGIS) 10-10 MG TBEC Take 1-2 tablets by mouth 3 (three) times daily. Pt takes one tablet twice daily in  the morning and at noon and two tablets at bedtime.   Past Week at Unknown time  . ibuprofen (ADVIL,MOTRIN) 600 MG tablet Take 1 tablet (600 mg total) by mouth every 6 (six) hours as needed for headache or moderate pain. 10 tablet 1 12/15/2015 at Unknown time  . Prenatal Vit-Fe Fumarate-FA (PRENATAL MULTIVITAMIN) TABS tablet Take 1 tablet by mouth daily.    12/15/2015 at Unknown time    Review of Systems  Constitutional: Negative for fever.  Eyes: Negative for blurred vision, double vision and photophobia.  Respiratory: Negative.  Negative for cough and sputum production.        +nasal congestion  Cardiovascular: Negative.   Gastrointestinal: Positive for nausea. Negative for vomiting and abdominal pain.  Genitourinary: Negative.   Musculoskeletal: Negative.   Skin: Negative.   Neurological: Positive for headaches.  Endo/Heme/Allergies: Negative.   Psychiatric/Behavioral: Negative.    Physical Exam   Blood pressure 120/70, pulse 74, temperature 98.3 F (36.8 C), temperature source Oral, resp. rate 18, last menstrual period 08/17/2015.  Physical Exam  Constitutional: She is oriented to person, place, and time. She appears well-developed and well-nourished.  HENT:  Head: Normocephalic and atraumatic.  Eyes: Pupils are equal, round, and reactive to light.  Neck: Normal range of motion. Neck supple.  Cardiovascular: Normal rate and regular  rhythm.   Respiratory: Effort normal and breath sounds normal.  GI: Soft. Bowel sounds are normal.  Genitourinary:  deferred  Musculoskeletal: Normal range of motion.  Neurological: She is alert and oriented to person, place, and time.  Skin: Skin is warm and dry.  Psychiatric: She has a normal mood and affect.    MAU Course  Procedures Spoke with Dr. Clearance CootsHarper, recommends HA protocol and discharge home  MDM Ordered HA protocol but pt does not have transportation-will discharge for home Fioricet and rest. No evidence of migraine or  Pre-e  Assessment and Plan  Headache in pregnancy Nausea in pregnancy  Discharge home Continue Fioricet 1-2 tabs q6 hrs prn (has Rx) Continue Tylenol prn Maintain good hydration Follow up with Dr. Clearance CootsHarper next week-referral for headache specialist as needed Return for warning sx w/headache    Donette LarryMelanie Adilene Areola 12/15/2015, 9:36 AM

## 2015-12-15 NOTE — Discharge Instructions (Signed)

## 2015-12-15 NOTE — MAU Note (Signed)
HA started at 0400, nausea, cold sxs since yesterday.

## 2015-12-16 ENCOUNTER — Encounter: Payer: Self-pay | Admitting: "Endocrinology

## 2015-12-16 ENCOUNTER — Ambulatory Visit (INDEPENDENT_AMBULATORY_CARE_PROVIDER_SITE_OTHER): Payer: Medicaid Other | Admitting: "Endocrinology

## 2015-12-16 VITALS — BP 129/82 | HR 72 | Ht 66.0 in | Wt 177.0 lb

## 2015-12-16 DIAGNOSIS — E0590 Thyrotoxicosis, unspecified without thyrotoxic crisis or storm: Secondary | ICD-10-CM | POA: Diagnosis not present

## 2015-12-16 NOTE — Progress Notes (Signed)
Subjective:    Patient ID: Brittany [name], female    DOB: [birth year], PCP No PCP Per Patient   Past Medical History  Diagnosis Date  . Headache(784.0)   . Hypertension   . Pregnancy induced hypertension   . Vaginal Pap smear, abnormal   . Gallstone    Past Surgical History  Procedure Laterality Date  . Leep     Social History   Social History  . Marital Status: Married    Spouse Name: N/A  . Number of Children: N/A  . Years of Education: N/A   Social History Main Topics  . Smoking status: Never Smoker   . Smokeless tobacco: Never Used  . Alcohol Use: No  . Drug Use: No  . Sexual Activity: Yes     Comment: last intercourse 09/24/15   Other Topics Concern  . None   Social History Narrative   Outpatient Encounter Prescriptions as of 12/16/2015  Medication Sig  . acetaminophen (TYLENOL) 500 MG tablet Take 1,000 mg by mouth every 6 (six) hours as needed for mild pain or headache.   . butalbital-acetaminophen-caffeine (FIORICET) 50-325-40 MG tablet Take 1-2 tablets by mouth every 6 (six) hours as needed for headache.  . Doxylamine-Pyridoxine (DICLEGIS) 10-10 MG TBEC Take 1-2 tablets by mouth 3 (three) times daily. Pt takes one tablet twice daily in the morning and at noon and two tablets at bedtime.  . Prenatal Vit-Fe Fumarate-FA (PRENATAL MULTIVITAMIN) TABS tablet Take 1 tablet by mouth daily.    No facility-administered encounter medications on file as of 12/16/2015.   ALLERGIES: No Known Allergies VACCINATION STATUS: Immunization History  Administered Date(s) Administered  . Tdap 04/07/2011    HPI  35 year old female patient with medical history as above. She is being seen in f/u  for abnormal thyroid function test during pregnancy , now at 17 weeks. -She denies any prior history of thyroid dysfunction. She denies any family history of thyroid problem nor thyroid cancer. On 10/29/2015 she was found to have TSH of 0.155 at approximately 10th week of her  pregnancy.  Her repeat full profile thyroid function tests shows normalization of these studies. -She denies heat intolerance, palpitations, tremors. She has gained 4 pounds since last visit. -She complains of sleepiness. -She denies any exposure to thyroid hormones and she is taking prenatal vitamins and Tylenol on and off for pain control. -She is consistent in following up for her prenatal care.    Review of Systems Constitutional: + weight gain, no fatigue, no subjective hyperthermia/hypothermia Eyes: no blurry vision, no xerophthalmia ENT: no sore throat, no nodules palpated in throat, no dysphagia/odynophagia, no hoarseness Cardiovascular: no CP/SOB/palpitations/leg swelling Respiratory: no cough/SOB Gastrointestinal: no N/V/D/C Musculoskeletal: no muscle/joint aches Skin: no rashes Neurological: no tremors/numbness/tingling/dizziness, feels sleepy Psychiatric: no depression/anxiety  Objective:    BP 129/82 mmHg  Pulse 72  Ht  (1.676 m)  Wt 177 lb (80.287 kg)  BMI 28.58 kg/m2  SpO2 97%  LMP 08/17/2015  Wt Readings from Last 3 Encounters:  12/16/15 177 lb (80.287 kg)  12/09/15 174 lb (78.926 kg)  12/06/15 176 lb 12.8 oz (80.196 kg)    Physical Exam  Constitutional: Pregnant with her third child at 17 weeks, in NAD Eyes: PERRLA, EOMI, no exophthalmos ENT: moist mucous membranes, no thyromegaly, no cervical lymphadenopathy Cardiovascular: RRR, No MRG Respiratory: CTA B Gastrointestinal: abdomen soft, NT, ND, BS+ Musculoskeletal: no deformities, strength intact in all 4 Skin: moist, warm, no rashes Neurological: no tremor with outstretched  hands, DTR normal in all 4  Results for Brittany CamelOE, Darren LERIKA (MRN [id number]) as of 12/16/2015 13:50  Ref. Range 12/09/2015 11:34  TSH Latest Units: mIU/L 0.80  T4,Free(Direct) Latest Ref Range: 0.8-1.8 ng/dL 1.1  Triiodothyronine,Free,Serum Latest Ref Range: 2.3-4.2 pg/mL 2.6  Thyroglobulin Ab Latest Ref Range: <2 IU/mL <1   Thyroperoxidase Ab SerPl-aCnc Latest Ref Range: <9 IU/mL 2    On 10/29/2015 her TSH was 0.155, total T4 13, and free thyroxine index 2.6.     Assessment & Plan:   1. Subclinical hyperthyroidism - This has since resolved. Her repeat thyroid function tests are within normal limits. She is clinically euthyroid. - The suppressed TSH prior to her last visit was  likely gestational transient thyrotoxicosis induced by hCG. [facility]. First trimester TSH target is between 0.1- 2.5, second trimester 0.2-3.0,  hers is now at 0.8 associated with normal free T4 and free T3. TSI negative.  she will not require antithyroid intervention for now. -She will have free T4 and TSH repeated in 3 months with office visit.   - I advised patient to maintain close follow up with OB/GYN for her prenatal care needs. Follow up plan: Return in about 3 months (around 03/17/2016) for follow up with pre-visit labs.  Marquis LunchGebre Infant Zink, MD Phone: [phone]  Fax: [phone]   12/16/2015, 1:55 PM

## 2015-12-17 ENCOUNTER — Other Ambulatory Visit: Payer: Self-pay | Admitting: Certified Nurse Midwife

## 2015-12-23 ENCOUNTER — Ambulatory Visit (INDEPENDENT_AMBULATORY_CARE_PROVIDER_SITE_OTHER): Payer: Medicaid Other | Admitting: Obstetrics

## 2015-12-23 ENCOUNTER — Ambulatory Visit (HOSPITAL_COMMUNITY)
Admission: RE | Admit: 2015-12-23 | Discharge: 2015-12-23 | Disposition: A | Discharge status: Home / Self Care | Payer: Medicaid Other | Source: Ambulatory Visit | Attending: Certified Nurse Midwife | Admitting: Certified Nurse Midwife

## 2015-12-23 ENCOUNTER — Other Ambulatory Visit: Payer: Self-pay | Admitting: Certified Nurse Midwife

## 2015-12-23 ENCOUNTER — Encounter: Payer: Self-pay | Admitting: Obstetrics

## 2015-12-23 VITALS — BP 122/79 | HR 80 | Wt 178.0 lb

## 2015-12-23 DIAGNOSIS — R51 Headache: Secondary | ICD-10-CM

## 2015-12-23 DIAGNOSIS — O09292 Supervision of pregnancy with other poor reproductive or obstetric history, second trimester: Secondary | ICD-10-CM | POA: Insufficient documentation

## 2015-12-23 DIAGNOSIS — Z3482 Encounter for supervision of other normal pregnancy, second trimester: Secondary | ICD-10-CM

## 2015-12-23 DIAGNOSIS — O09212 Supervision of pregnancy with history of pre-term labor, second trimester: Secondary | ICD-10-CM | POA: Diagnosis not present

## 2015-12-23 DIAGNOSIS — Z3A18 18 weeks gestation of pregnancy: Secondary | ICD-10-CM | POA: Insufficient documentation

## 2015-12-23 DIAGNOSIS — O26891 Other specified pregnancy related conditions, first trimester: Secondary | ICD-10-CM

## 2015-12-23 LAB — POCT URINALYSIS DIPSTICK
BILIRUBIN UA: NEGATIVE
Blood, UA: NEGATIVE
GLUCOSE UA: NEGATIVE
KETONES UA: NEGATIVE
Leukocytes, UA: NEGATIVE
Nitrite, UA: NEGATIVE
Protein, UA: NEGATIVE
SPEC GRAV UA: 1.01
Urobilinogen, UA: NEGATIVE
pH, UA: 6

## 2015-12-23 NOTE — Progress Notes (Signed)
  Subjective:    Brittany CamelStacy [name] is a 35 y.o. female being seen today for her obstetrical visit. She is at 6236w2d gestation. Patient reports: headache.  Problem List Items Addressed This Visit    None    Visit Diagnoses    Encounter for supervision of other normal pregnancy in second trimester    -  Primary    Relevant Orders    POCT urinalysis dipstick (Completed)      Patient Active Problem List   Diagnosis Date Noted  . Subclinical hyperthyroidism 12/09/2015  . Supervision of high risk pregnancy, antepartum 10/29/2015  . Pregnancy induced hypertension, delivered, current hospitalization 04/09/2011    Objective:     BP 122/79 mmHg  Pulse 80  Wt 178 lb (80.74 kg)  LMP 08/17/2015 Uterine Size: Below umbilicus     Assessment:    Pregnancy @ 4736w2d  weeks HA.  Fioricet works but causes drowsiness  Plan:    Paediatric nurseContinue Fioricet.  Problem list reviewed and updated. Labs reviewed.  Follow up in 4 weeks. FIRST/CF mutation testing/NIPT/QUAD SCREEN/fragile X/Ashkenazi Jewish population testing/Spinal muscular atrophy discussed: requested. Role of ultrasound in pregnancy discussed; fetal survey: requested. Amniocentesis discussed: not indicated.

## 2015-12-25 ENCOUNTER — Ambulatory Visit (INDEPENDENT_AMBULATORY_CARE_PROVIDER_SITE_OTHER): Payer: Medicaid Other | Admitting: *Deleted

## 2015-12-25 VITALS — BP 115/78 | HR 73 | Wt 180.0 lb

## 2015-12-25 DIAGNOSIS — O09212 Supervision of pregnancy with history of pre-term labor, second trimester: Secondary | ICD-10-CM

## 2015-12-25 DIAGNOSIS — Z3492 Encounter for supervision of normal pregnancy, unspecified, second trimester: Secondary | ICD-10-CM

## 2015-12-25 LAB — POCT URINALYSIS DIPSTICK
Bilirubin, UA: NEGATIVE
Blood, UA: NEGATIVE
Glucose, UA: NEGATIVE
Ketones, UA: NEGATIVE
LEUKOCYTES UA: NEGATIVE
Nitrite, UA: NEGATIVE
PH UA: 6
SPEC GRAV UA: 1.015
UROBILINOGEN UA: NEGATIVE

## 2015-12-25 MED ORDER — HYDROXYPROGESTERONE CAPROATE 250 MG/ML IM OIL
250.0000 mg | TOPICAL_OIL | INTRAMUSCULAR | Status: DC
  Administered 2015-12-25 – 2016-03-25 (×8): 250 mg via INTRAMUSCULAR

## 2015-12-25 NOTE — Progress Notes (Signed)
Patient is here today for her first 17P injection. Injection tolerated well in RUOQ.

## 2015-12-30 ENCOUNTER — Ambulatory Visit: Payer: Medicaid Other

## 2015-12-30 NOTE — Progress Notes (Unsigned)
Patient tolerated Injection 17P well in LUOQ..Marland Kitchen

## 2016-01-01 ENCOUNTER — Other Ambulatory Visit: Payer: Self-pay | Admitting: Certified Nurse Midwife

## 2016-01-05 ENCOUNTER — Inpatient Hospital Stay (HOSPITAL_COMMUNITY)
Admission: AD | Admit: 2016-01-05 | Discharge: 2016-01-05 | Disposition: A | Discharge status: Home / Self Care | Payer: Medicaid Other | Source: Ambulatory Visit | Attending: Obstetrics | Admitting: Obstetrics

## 2016-01-05 ENCOUNTER — Encounter (HOSPITAL_COMMUNITY): Payer: Self-pay | Admitting: *Deleted

## 2016-01-05 DIAGNOSIS — R51 Headache: Secondary | ICD-10-CM | POA: Diagnosis not present

## 2016-01-05 DIAGNOSIS — R42 Dizziness and giddiness: Secondary | ICD-10-CM | POA: Diagnosis present

## 2016-01-05 DIAGNOSIS — O26892 Other specified pregnancy related conditions, second trimester: Secondary | ICD-10-CM | POA: Diagnosis not present

## 2016-01-05 DIAGNOSIS — O162 Unspecified maternal hypertension, second trimester: Secondary | ICD-10-CM | POA: Insufficient documentation

## 2016-01-05 DIAGNOSIS — N951 Menopausal and female climacteric states: Secondary | ICD-10-CM | POA: Insufficient documentation

## 2016-01-05 DIAGNOSIS — Z3A20 20 weeks gestation of pregnancy: Secondary | ICD-10-CM | POA: Diagnosis not present

## 2016-01-05 DIAGNOSIS — R519 Headache, unspecified: Secondary | ICD-10-CM

## 2016-01-05 LAB — CBC
HCT: 33.2 % — ABNORMAL LOW (ref 36.0–46.0)
Hemoglobin: 11.2 g/dL — ABNORMAL LOW (ref 12.0–15.0)
MCH: 27.9 pg (ref 26.0–34.0)
MCHC: 33.7 g/dL (ref 30.0–36.0)
MCV: 82.8 fL (ref 78.0–100.0)
PLATELETS: 228 10*3/uL (ref 150–400)
RBC: 4.01 MIL/uL (ref 3.87–5.11)
RDW: 13.6 % (ref 11.5–15.5)
WBC: 11 10*3/uL — AB (ref 4.0–10.5)

## 2016-01-05 LAB — URINALYSIS, ROUTINE W REFLEX MICROSCOPIC
Bilirubin Urine: NEGATIVE
Glucose, UA: NEGATIVE mg/dL
Hgb urine dipstick: NEGATIVE
KETONES UR: 15 mg/dL — AB
LEUKOCYTES UA: NEGATIVE
NITRITE: NEGATIVE
PROTEIN: NEGATIVE mg/dL
Specific Gravity, Urine: 1.01 (ref 1.005–1.030)
pH: 6 (ref 5.0–8.0)

## 2016-01-05 LAB — GLUCOSE, CAPILLARY: GLUCOSE-CAPILLARY: 106 mg/dL — AB (ref 65–99)

## 2016-01-05 MED ORDER — ACETAMINOPHEN 325 MG PO TABS
650.0000 mg | ORAL_TABLET | Freq: Once | ORAL | Status: AC
  Administered 2016-01-05: 650 mg via ORAL
  Filled 2016-01-05: qty 2

## 2016-01-05 NOTE — Discharge Instructions (Signed)
General Headache Without Cause A headache is pain or discomfort felt around the head or neck area. The specific cause of a headache may not be found. There are many causes and types of headaches. A few common ones are:  Tension headaches.  Migraine headaches.  Cluster headaches.  Chronic daily headaches. HOME CARE INSTRUCTIONS  Watch your condition for any changes. Take these steps to help with your condition: Managing Pain  Take over-the-counter and prescription medicines only as told by your health care provider.  Lie down in a dark, quiet room when you have a headache.  If directed, apply ice to the head and neck area:  Put ice in a plastic bag.  Place a towel between your skin and the bag.  Leave the ice on for 20 minutes, 2-3 times per day.  Use a heating pad or hot shower to apply heat to the head and neck area as told by your health care provider.  Keep lights dim if bright lights bother you or make your headaches worse. Eating and Drinking  Eat meals on a regular schedule.  Limit alcohol use.  Decrease the amount of caffeine you drink, or stop drinking caffeine. General Instructions  Keep all follow-up visits as told by your health care provider. This is important.  Keep a headache journal to help find out what may trigger your headaches. For example, write down:  What you eat and drink.  How much sleep you get.  Any change to your diet or medicines.  Try massage or other relaxation techniques.  Limit stress.  Sit up straight, and do not tense your muscles.  Do not use tobacco products, including cigarettes, chewing tobacco, or e-cigarettes. If you need help quitting, ask your health care provider.  Exercise regularly as told by your health care provider.  Sleep on a regular schedule. Get 7-9 hours of sleep, or the amount recommended by your health care provider. SEEK MEDICAL CARE IF:   Your symptoms are not helped by medicine.  You have a  headache that is different from the usual headache.  You have nausea or you vomit.  You have a fever. SEEK IMMEDIATE MEDICAL CARE IF:   Your headache becomes severe.  You have repeated vomiting.  You have a stiff neck.  You have a loss of vision.  You have problems with speech.  You have pain in the eye or ear.  You have muscular weakness or loss of muscle control.  You lose your balance or have trouble walking.  You feel faint or pass out.  You have confusion.   This information is not intended to replace advice given to you by your health care provider. Make sure you discuss any questions you have with your health care provider.   Document Released: 07/04/2005 Document Revised: 03/25/2015 Document Reviewed: 10/27/2014 Elsevier Interactive Patient Education 2016 Else[employer] Inc[employer] Dizziness Dizziness is a common problem. It is a feeling of unsteadiness or light-headedness. You may feel like you are about to faint. Dizziness can lead to injury if you stumble or fall. Anyone can become dizzy, but dizziness is more common in older adults. This condition can be caused by a number of things, including medicines, dehydration, or illness. HOME CARE INSTRUCTIONS Taking these steps may help with your condition: Eating and Drinking  Drink enough fluid to keep your urine clear or pale yellow. This helps to keep you from becoming dehydrated. Try to drink more clear fluids, such as water.  Do not drink alcohol.  Limit your caffeine intake if directed by your health care provider. °· Limit your salt intake if directed by your health care provider. °Activity °· Avoid making quick movements. °¨ Rise slowly from chairs and steady yourself until you feel okay. °¨ In the morning, first sit up on the side of the bed. When you feel okay, stand slowly while you hold onto something until you know that your balance is fine. °· Move your legs often if you need to stand in one place for a long time.  Tighten and relax your muscles in your legs while you are standing. °· Do not drive or operate heavy machinery if you feel dizzy. °· Avoid bending down if you feel dizzy. Place items in your home so that they are easy for you to reach without leaning over. °Lifestyle °· Do not use any tobacco products, including cigarettes, chewing tobacco, or electronic cigarettes. If you need help quitting, ask your health care provider. °· Try to reduce your stress level, such as with yoga or meditation. Talk with your health care provider if you need help. °General Instructions °· Watch your dizziness for any changes. °· Take medicines only as directed by your health care provider. Talk with your health care provider if you think that your dizziness is caused by a medicine that you are taking. °· Tell a friend or a family member that you are feeling dizzy. If he or she notices any changes in your behavior, have this person call your health care provider. °· Keep all follow-up visits as directed by your health care provider. This is important. °SEEK MEDICAL CARE IF: °· Your dizziness does not go away. °· Your dizziness or light-headedness gets worse. °· You feel nauseous. °· You have reduced hearing. °· You have new symptoms. °· You are unsteady on your feet or you feel like the room is spinning. °SEEK IMMEDIATE MEDICAL CARE IF: °· You vomit or have diarrhea and are unable to eat or drink anything. °· You have problems talking, walking, swallowing, or using your arms, hands, or legs. °· You feel generally weak. °· You are not thinking clearly or you have trouble forming sentences. It may take a friend or family member to notice this. °· You have chest pain, abdominal pain, shortness of breath, or sweating. °· Your vision changes. °· You notice any bleeding. °· You have a headache. °· You have neck pain or a stiff neck. °· You have a fever. °  °This information is not intended to replace advice given to you by your health care  provider. Make sure you discuss any questions you have with your health care provider. °  °Document Released: 12/28/2000 Document Revised: 11/18/2014 Document Reviewed: 06/30/2014 °Elsevier Interactive Patient Education ©2016 Elsevier Inc. ° °

## 2016-01-05 NOTE — MAU Provider Note (Signed)
History     CSN: [id number]  Arrival date and time: 01/05/16 1027   First Provider Initiated Contact with Patient 01/05/16 1127      Chief Complaint  Patient presents with  . Dizziness  . Hot Flashes   HPI Brittany [name] is a 35 y.o. [phone] at 6329w1d who presents with dizziness, hot flashes & headache. Symptoms began at 4 am this morning while she was at work. Reports feeling lightheaded every time she would stand up. Denies LOC or syncope. Had not eaten since the night before. Also reports headache since this morning. Rates 6/10; has not treated.  Denies abdominal pain, vaginal bleeding, LOF, chest pain, SOB, or palpitations.   OB History    Gravida Para Term Preterm AB TAB SAB Ectopic Multiple Living   8 2 1 1 5 1 4  0 0 2      Past Medical History  Diagnosis Date  . Headache(784.0)   . Hypertension   . Pregnancy induced hypertension   . Vaginal Pap smear, abnormal   . Gallstone     Past Surgical History  Procedure Laterality Date  . Leep      Family History  Problem Relation Age of Onset  . Asthma Mother   . Hypertension Mother   . Asthma Maternal Grandmother     Social History  Substance Use Topics  . Smoking status: Never Smoker   . Smokeless tobacco: Never Used  . Alcohol Use: No    Allergies: No Known Allergies  Facility-administered medications prior to admission  Medication Dose Route Frequency Provider Last Rate Last Dose  . hydroxyprogesterone caproate (MAKENA) 250 mg/mL injection 250 mg  250 mg Intramuscular Weekly Brock Badharles A Harper, MD   250 mg at 12/25/15 1021   Prescriptions prior to admission  Medication Sig Dispense Refill Last Dose  . acetaminophen (TYLENOL) 500 MG tablet Take 1,000 mg by mouth every 6 (six) hours as needed for mild pain or headache.    Taking  . butalbital-acetaminophen-caffeine (FIORICET) 50-325-40 MG tablet Take 1-2 tablets by mouth every 6 (six) hours as needed for headache. 45 tablet 4 Taking  .  Doxylamine-Pyridoxine (DICLEGIS) 10-10 MG TBEC Take 1-2 tablets by mouth 3 (three) times daily. Pt takes one tablet twice daily in the morning and at noon and two tablets at bedtime.   Past Week at Unknown time  . Prenatal Vit-Fe Fumarate-FA (PRENATAL MULTIVITAMIN) TABS tablet Take 1 tablet by mouth daily.    Taking    Review of Systems  Constitutional: Negative.   Eyes: Negative.   Respiratory: Negative.   Cardiovascular: Negative.   Gastrointestinal: Negative.   Genitourinary: Negative.   Neurological: Positive for dizziness and headaches. Negative for loss of consciousness.   Physical Exam   Blood pressure 127/88, pulse 86, temperature 98.1 F (36.7 C), temperature source Oral, resp. rate 18, last menstrual period 08/17/2015. Orthostatic VS for the past 24 hrs:  BP- Lying Pulse- Lying BP- Sitting Pulse- Sitting BP- Standing at 0 minutes Pulse- Standing at 0 minutes  01/05/16 1050 - - - - 126/81 mmHg 100  01/05/16 1049 - - 123/80 mmHg 85 - -  01/05/16 1047 119/74 mmHg 80 - - - -   Physical Exam  Nursing note and vitals reviewed. Constitutional: She is oriented to person, place, and time. She appears well-developed and well-nourished. No distress.  HENT:  Head: Normocephalic and atraumatic.  Eyes: Conjunctivae are normal. Right eye exhibits no discharge. Left eye exhibits no discharge. No scleral icterus.  Neck: Normal range of motion.  Cardiovascular: Normal rate, regular rhythm and normal heart sounds.   No murmur heard. Respiratory: Effort normal and breath sounds normal. No respiratory distress. She has no wheezes.  GI: Soft. Bowel sounds are normal. She exhibits distension. There is no tenderness.  Neurological: She is alert and oriented to person, place, and time.  Skin: Skin is warm and dry. She is not diaphoretic.  Psychiatric: She has a normal mood and affect. Her behavior is normal. Judgment and thought content normal.    MAU Course  Procedures Results for orders  placed or performed during the hospital encounter of 01/05/16 (from the past 24 hour(s))  Urinalysis, Routine w reflex microscopic (not at [facility])     Status: Abnormal   Collection Time: 01/05/16 10:30 AM  Result Value Ref Range   Color, Urine YELLOW YELLOW   APPearance CLEAR CLEAR   Specific Gravity, Urine 1.010 1.005 - 1.030   pH 6.0 5.0 - 8.0   Glucose, UA NEGATIVE NEGATIVE mg/dL   Hgb urine dipstick NEGATIVE NEGATIVE   Bilirubin Urine NEGATIVE NEGATIVE   Ketones, ur 15 (A) NEGATIVE mg/dL   Protein, ur NEGATIVE NEGATIVE mg/dL   Nitrite NEGATIVE NEGATIVE   Leukocytes, UA NEGATIVE NEGATIVE  Glucose, capillary     Status: Abnormal   Collection Time: 01/05/16 11:09 AM  Result Value Ref Range   Glucose-Capillary 106 (H) 65 - 99 mg/dL  CBC     Status: Abnormal   Collection Time: 01/05/16 11:27 AM  Result Value Ref Range   WBC 11.0 (H) 4.0 - 10.5 K/uL   RBC 4.01 3.87 - 5.11 MIL/uL   Hemoglobin 11.2 (L) 12.0 - 15.0 g/dL   HCT 16.1 (L) 09.6 - 04.5 %   MCV 82.8 78.0 - 100.0 fL   MCH 27.9 26.0 - 34.0 pg   MCHC 33.7 30.0 - 36.0 g/dL   RDW 40.9 81.1 - 91.4 %   Platelets 228 150 - 400 K/uL    MDM FHT 153 by doppler Orthostatic VS negative Tylenol 650 mg PO -- pt reports improvement in headache  Assessment and Plan  A: 1. Headache in pregnancy, antepartum, second trimester   2. Dizziness     P: Discharge home Take tylenol otc prn headache Increase water intake Discussed eating throughout the day to maintain blood sugar Discussed reasons to return to MAU  Judeth Horn 01/05/2016, 11:26 AM

## 2016-01-05 NOTE — MAU Note (Signed)
Patient presents at [redacted] weeks gestation with c/o dizziness and hot flashes since 0400 today. Fetus active. Denies bleeding or discharge.

## 2016-01-06 ENCOUNTER — Ambulatory Visit (INDEPENDENT_AMBULATORY_CARE_PROVIDER_SITE_OTHER): Payer: Medicaid Other | Admitting: *Deleted

## 2016-01-06 VITALS — BP 120/84 | HR 73 | Temp 98.5°F | Wt 175.0 lb

## 2016-01-06 DIAGNOSIS — O09892 Supervision of other high risk pregnancies, second trimester: Secondary | ICD-10-CM

## 2016-01-06 DIAGNOSIS — O09212 Supervision of pregnancy with history of pre-term labor, second trimester: Secondary | ICD-10-CM | POA: Diagnosis not present

## 2016-01-06 NOTE — Progress Notes (Signed)
[facility] Her only complaint is burning at the injection site for 3-4 days after injection. Advised she could try using benadryl to ease reaction.

## 2016-01-13 ENCOUNTER — Ambulatory Visit (INDEPENDENT_AMBULATORY_CARE_PROVIDER_SITE_OTHER): Payer: Medicaid Other | Admitting: *Deleted

## 2016-01-13 VITALS — BP 127/82 | HR 81

## 2016-01-13 DIAGNOSIS — O09212 Supervision of pregnancy with history of pre-term labor, second trimester: Secondary | ICD-10-CM

## 2016-01-13 DIAGNOSIS — O09892 Supervision of other high risk pregnancies, second trimester: Secondary | ICD-10-CM

## 2016-01-13 NOTE — Progress Notes (Signed)
Patient is here for 17P injection. Benadryl seemed to help a little with the burning sensation experienced after injections as suggested by Erskine SquibbJane, Charity fundraiserN. Patient tolerated injection well in LUOQ..Marland Kitchen

## 2016-01-20 ENCOUNTER — Ambulatory Visit (INDEPENDENT_AMBULATORY_CARE_PROVIDER_SITE_OTHER): Payer: Medicaid Other | Admitting: Certified Nurse Midwife

## 2016-01-20 VITALS — BP 122/80 | HR 80 | Wt 177.0 lb

## 2016-01-20 DIAGNOSIS — O09892 Supervision of other high risk pregnancies, second trimester: Secondary | ICD-10-CM

## 2016-01-20 DIAGNOSIS — O09212 Supervision of pregnancy with history of pre-term labor, second trimester: Secondary | ICD-10-CM | POA: Diagnosis not present

## 2016-01-20 DIAGNOSIS — O0992 Supervision of high risk pregnancy, unspecified, second trimester: Secondary | ICD-10-CM

## 2016-01-20 LAB — POCT URINALYSIS DIPSTICK
Bilirubin, UA: NEGATIVE
Blood, UA: NEGATIVE
GLUCOSE UA: 100
KETONES UA: NEGATIVE
Leukocytes, UA: NEGATIVE
Nitrite, UA: NEGATIVE
Protein, UA: NEGATIVE
SPEC GRAV UA: 1.015
Urobilinogen, UA: NEGATIVE
pH, UA: 6

## 2016-01-20 NOTE — Progress Notes (Signed)
Subjective:    Brittany [name] is a 35 y.o. female being seen today for her obstetrical visit. She is at 4146w2d gestation. Patient reports: no bleeding, no contractions, no cramping, no leaking and needs dental provider, is having issues with her teeth bothering her, dental provider list given to patient . Fetal movement: normal.  Problem List Items Addressed This Visit    None     Patient Active Problem List   Diagnosis Date Noted  . Subclinical hyperthyroidism 12/09/2015  . Supervision of high risk pregnancy, antepartum 10/29/2015  . Pregnancy induced hypertension, delivered, current hospitalization 04/09/2011   Objective:    BP 122/80 mmHg  Pulse 80  Wt 177 lb (80.287 kg)  LMP 08/17/2015 FHT: 155 BPM  Uterine Size: size equals dates     Assessment:    Pregnancy @ 5846w2d    Dental carries in pregnancy  High risk pregnancy with hx of PTD: on 17-P injections Plan:   17-P injection today  OBGCT: discussed. Signs and symptoms of preterm labor: discussed.  Labs, problem list reviewed and updated 2 hr GTT planned Follow up in 4 weeks.

## 2016-01-20 NOTE — Addendum Note (Signed)
Addended by: Elby BeckPAUL, Jeanpierre Thebeau F on: 01/20/2016 12:09 PM   Modules accepted: Orders

## 2016-01-23 ENCOUNTER — Encounter (HOSPITAL_COMMUNITY): Payer: Self-pay

## 2016-01-23 ENCOUNTER — Inpatient Hospital Stay (HOSPITAL_COMMUNITY)
Admission: AD | Admit: 2016-01-23 | Discharge: 2016-01-23 | Disposition: A | Discharge status: Home / Self Care | Payer: Medicaid Other | Source: Ambulatory Visit | Attending: Obstetrics and Gynecology | Admitting: Obstetrics and Gynecology

## 2016-01-23 DIAGNOSIS — R51 Headache: Secondary | ICD-10-CM | POA: Diagnosis not present

## 2016-01-23 DIAGNOSIS — M545 Low back pain, unspecified: Secondary | ICD-10-CM

## 2016-01-23 DIAGNOSIS — Z79899 Other long term (current) drug therapy: Secondary | ICD-10-CM | POA: Diagnosis not present

## 2016-01-23 DIAGNOSIS — R519 Headache, unspecified: Secondary | ICD-10-CM

## 2016-01-23 DIAGNOSIS — O26892 Other specified pregnancy related conditions, second trimester: Secondary | ICD-10-CM | POA: Diagnosis not present

## 2016-01-23 DIAGNOSIS — Z3A22 22 weeks gestation of pregnancy: Secondary | ICD-10-CM | POA: Diagnosis not present

## 2016-01-23 LAB — URINALYSIS, ROUTINE W REFLEX MICROSCOPIC
BILIRUBIN URINE: NEGATIVE
Glucose, UA: NEGATIVE mg/dL
Hgb urine dipstick: NEGATIVE
Ketones, ur: NEGATIVE mg/dL
LEUKOCYTES UA: NEGATIVE
NITRITE: NEGATIVE
PH: 6.5 (ref 5.0–8.0)
Protein, ur: NEGATIVE mg/dL

## 2016-01-23 MED ORDER — IBUPROFEN 600 MG PO TABS
600.0000 mg | ORAL_TABLET | Freq: Once | ORAL | Status: AC
  Administered 2016-01-23: 600 mg via ORAL
  Filled 2016-01-23: qty 1

## 2016-01-23 NOTE — MAU Provider Note (Signed)
History  Chief Complaint:  Headache and Back Pain  Brittany ArnoldStacy Jimmie MollyLerika [name] is a 35 y.o. [phone] female at 3654w5d presenting w/ report of frontal ha, took apap at home but didn't help, 'never helps'. Has ha's even when not pregnant, always worsens during pregnancy. Denies visual changes, n/v. Prior to me being called, RN thought another CNM who was currently in MAU would be seeing pt- so she talked to CNM who ordered ibuprofen 600mg  po x 1 for headache, which has helped some.  Also reports LBP.  Denies uti s/s, abnormal/malodorous vag d/c or vulvovaginal itching/irritation.   Reports active fetal movement, contractions: none, vaginal bleeding: none, membranes: intact. [facility]  Next visit- weekly for 17P, then 8/2 for next visit. Pregnancy complicated by h/o PTB x 1, h/o pre-e.   Obstetrical History: OB History    Gravida Para Term Preterm AB TAB SAB Ectopic Multiple Living   8 2 1 1 5 1 4  0 0 2      Past Medical History: Past Medical History  Diagnosis Date  . Headache(784.0)   . Hypertension   . Pregnancy induced hypertension   . Vaginal Pap smear, abnormal   . Gallstone     Past Surgical History: Past Surgical History  Procedure Laterality Date  . Leep      Social History: Social History   Social History  . Marital Status: Married    Spouse Name: N/A  . Number of Children: N/A  . Years of Education: N/A   Social History Main Topics  . Smoking status: Never Smoker   . Smokeless tobacco: Never Used  . Alcohol Use: No  . Drug Use: No  . Sexual Activity: Yes    Birth Control/ Protection: None     Comment: last intercourse 09/24/15   Other Topics Concern  . None   Social History Narrative    Allergies: No Known Allergies  Facility-administered medications prior to admission  Medication Dose Route Frequency Provider Last Rate Last Dose  . hydroxyprogesterone caproate (MAKENA) 250 mg/mL injection 250 mg  250 mg Intramuscular Weekly Brock Badharles A Harper, MD    250 mg at 01/20/16 1758   Prescriptions prior to admission  Medication Sig Dispense Refill Last Dose  . acetaminophen (TYLENOL) 500 MG tablet Take 1,000 mg by mouth every 6 (six) hours as needed for mild pain or headache.    Taking  . butalbital-acetaminophen-caffeine (FIORICET) 50-325-40 MG tablet Take 1-2 tablets by mouth every 6 (six) hours as needed for headache. 45 tablet 4 Taking  . Doxylamine-Pyridoxine (DICLEGIS) 10-10 MG TBEC Take 1-2 tablets by mouth 3 (three) times daily. Pt takes one tablet twice daily in the morning and at noon and two tablets at bedtime.   Taking  . Prenatal Vit-Fe Fumarate-FA (PRENATAL MULTIVITAMIN) TABS tablet Take 1 tablet by mouth daily.    Taking    Review of Systems  Pertinent pos/neg as indicated in HPI  Physical Exam  Blood pressure 118/69, pulse 81, temperature 98.6 F (37 C), temperature source Oral, resp. rate 16, last menstrual period 08/17/2015. General appearance: alert, cooperative and no distress Lungs: clear to auscultation bilaterally, normal effort Heart: regular rate and rhythm Abdomen: gravid, soft, non-tender Extremities: no edema DTR's 2+  Spec exam: n/a Cultures/Specimens: n/a    Presentation: unsure  Fetal monitoring: FHR: 138 bpm via doppler Uterine activity: n/a  MAU Course  Ibuprofen 600mg  po x 1 (ordered by another CNM prior to me seeing pt) UA, still in process  Labs:  No results found for this or any previous visit (from the past 24 hour(s)).  Imaging:  n/a  Assessment and Plan  A:  [redacted]w[redacted]d SIUP  [id number]  Headaches  LBP  P:  D/C home  Reviewed ha prevention/relief measures- gave printed info and protocol, if not helping to let ob know at next visit  Discussed and gave printed info on LBP relief measures  Reviewed s/s pre-e as she has h/o same, no evidence of that today  Keep next appt at [facility] on 8/2 as scheduled, continue weekly 17P for h/o PTB   Marge Duncans CNM,WHNP-BC 7/8/201710:45  PM

## 2016-01-23 NOTE — MAU Note (Signed)
Urine sent to lab 

## 2016-01-23 NOTE — Discharge Instructions (Signed)
For your lower back pain you may:  Purchase a pregnancy belt from [employer], [employer], [employer], etc and wear it while you are up and about  Take warm baths  Use a heating pad to your lower back for no longer than 20 minutes at a time, and do not place near abdomen  Take tylenol as needed. Please follow directions on the bottle   For Headaches:   Stay well hydrated, drink enough water so that your urine is clear, sometimes if you are dehydrated you can get headaches  Eat small frequent meals and snacks, sometimes if you are hungry you can get headaches  Sometimes you get headaches during pregnancy from the pregnancy hormones  You can try tylenol (1-2 regular strength 325mg  or 1-2 extra strength 500mg ) as directed on the box. The least amount of medication that works is best.   Cool compresses (cool wet washcloth or ice pack) to area of head that is hurting  You can also try drinking a caffeinated drink to see if this will help  If not helping, try below:  For Prevention of Headaches/Migraines:  CoQ10 100mg  three times daily  Vitamin B2 400mg  daily  Magnesium Oxide 400-600mg  daily  If You Get a Bad Headache/Migraine:  Benadryl 25mg    Magnesium Oxide  1 large Gatorade  2 extra strength Tylenol (1,000mg  total)  1 cup coffee or Coke  If this doesn't help please call your doctor's office

## 2016-01-23 NOTE — MAU Note (Signed)
Has had a backache that radiates to her side Pain 10/10.  Frontal headache that started a few hours ago Pain 9/10

## 2016-01-25 ENCOUNTER — Ambulatory Visit (INDEPENDENT_AMBULATORY_CARE_PROVIDER_SITE_OTHER): Payer: Medicaid Other | Admitting: Obstetrics

## 2016-01-25 DIAGNOSIS — O09212 Supervision of pregnancy with history of pre-term labor, second trimester: Secondary | ICD-10-CM

## 2016-01-25 DIAGNOSIS — O0992 Supervision of high risk pregnancy, unspecified, second trimester: Secondary | ICD-10-CM

## 2016-01-25 DIAGNOSIS — O09892 Supervision of other high risk pregnancies, second trimester: Secondary | ICD-10-CM

## 2016-01-25 MED ORDER — HYDROXYPROGESTERONE CAPROATE 250 MG/ML IM OIL
250.0000 mg | TOPICAL_OIL | Freq: Once | INTRAMUSCULAR | Status: AC
  Administered 2016-01-25: 250 mg via INTRAMUSCULAR

## 2016-01-27 ENCOUNTER — Ambulatory Visit: Payer: Medicaid Other

## 2016-01-28 NOTE — Progress Notes (Signed)
17-P injection.  Nursing visit only.

## 2016-02-01 ENCOUNTER — Ambulatory Visit (INDEPENDENT_AMBULATORY_CARE_PROVIDER_SITE_OTHER): Payer: Medicaid Other

## 2016-02-01 DIAGNOSIS — O09892 Supervision of other high risk pregnancies, second trimester: Secondary | ICD-10-CM

## 2016-02-01 DIAGNOSIS — O09212 Supervision of pregnancy with history of pre-term labor, second trimester: Secondary | ICD-10-CM | POA: Diagnosis not present

## 2016-02-03 ENCOUNTER — Ambulatory Visit: Payer: Medicaid Other

## 2016-02-04 ENCOUNTER — Inpatient Hospital Stay (HOSPITAL_COMMUNITY)
Admission: AD | Admit: 2016-02-04 | Discharge: 2016-02-04 | Disposition: A | Discharge status: Home / Self Care | Payer: Medicaid Other | Source: Ambulatory Visit | Attending: Obstetrics & Gynecology | Admitting: Obstetrics & Gynecology

## 2016-02-04 ENCOUNTER — Encounter (HOSPITAL_COMMUNITY): Payer: Self-pay | Admitting: *Deleted

## 2016-02-04 DIAGNOSIS — R110 Nausea: Secondary | ICD-10-CM | POA: Diagnosis not present

## 2016-02-04 DIAGNOSIS — Z3A24 24 weeks gestation of pregnancy: Secondary | ICD-10-CM | POA: Diagnosis not present

## 2016-02-04 DIAGNOSIS — O26892 Other specified pregnancy related conditions, second trimester: Secondary | ICD-10-CM | POA: Diagnosis not present

## 2016-02-04 DIAGNOSIS — O132 Gestational [pregnancy-induced] hypertension without significant proteinuria, second trimester: Secondary | ICD-10-CM | POA: Insufficient documentation

## 2016-02-04 DIAGNOSIS — O9989 Other specified diseases and conditions complicating pregnancy, childbirth and the puerperium: Secondary | ICD-10-CM | POA: Diagnosis not present

## 2016-02-04 DIAGNOSIS — O26891 Other specified pregnancy related conditions, first trimester: Secondary | ICD-10-CM

## 2016-02-04 DIAGNOSIS — R51 Headache: Secondary | ICD-10-CM | POA: Diagnosis not present

## 2016-02-04 DIAGNOSIS — R519 Headache, unspecified: Secondary | ICD-10-CM

## 2016-02-04 LAB — URINALYSIS, ROUTINE W REFLEX MICROSCOPIC
Bilirubin Urine: NEGATIVE
GLUCOSE, UA: NEGATIVE mg/dL
HGB URINE DIPSTICK: NEGATIVE
Ketones, ur: 15 mg/dL — AB
LEUKOCYTES UA: NEGATIVE
Nitrite: NEGATIVE
PH: 6 (ref 5.0–8.0)
PROTEIN: NEGATIVE mg/dL
Specific Gravity, Urine: <1.005 — ABNORMAL LOW (ref 1.005–1.030)

## 2016-02-04 MED ORDER — BUTALBITAL-APAP-CAFFEINE 50-325-40 MG PO TABS
2.0000 | ORAL_TABLET | Freq: Once | ORAL | Status: AC
  Administered 2016-02-04: 2 via ORAL
  Filled 2016-02-04: qty 2

## 2016-02-04 MED ORDER — BUTALBITAL-APAP-CAFFEINE 50-325-40 MG PO TABS: 1.0000 | ORAL_TABLET | Freq: Four times a day (QID) | ORAL | Status: DC | PRN

## 2016-02-04 MED ORDER — ONDANSETRON 4 MG PO TBDP
4.0000 mg | ORAL_TABLET | Freq: Once | ORAL | Status: AC
  Administered 2016-02-04: 4 mg via ORAL
  Filled 2016-02-04: qty 1

## 2016-02-04 NOTE — Discharge Instructions (Signed)
Migraine Headache  A migraine headache is very bad, throbbing pain on one or both sides of your head. Talk to your doctor about what things may bring on (trigger) your migraine headaches.  HOME CARE  · Only take medicines as told by your doctor.  · Lie down in a dark, quiet room when you have a migraine.  · Keep a journal to find out if certain things bring on migraine headaches. For example, write down:    What you eat and drink.    How much sleep you get.    Any change to your diet or medicines.  · Lessen how much alcohol you drink.  · Quit smoking if you smoke.  · Get enough sleep.  · Lessen any stress in your life.  · Keep lights dim if bright lights bother you or make your migraines worse.  GET HELP RIGHT AWAY IF:   · Your migraine becomes really bad.  · You have a fever.  · You have a stiff neck.  · You have trouble seeing.  · Your muscles are weak, or you lose muscle control.  · You lose your balance or have trouble walking.  · You feel like you will pass out (faint), or you pass out.  · You have really bad symptoms that are different than your first symptoms.  MAKE SURE YOU:   · Understand these instructions.  · Will watch your condition.  · Will get help right away if you are not doing well or get worse.     This information is not intended to replace advice given to you by your health care provider. Make sure you discuss any questions you have with your health care provider.     Document Released: 04/12/2008 Document Revised: 09/26/2011 Document Reviewed: 03/11/2013  Elsevier Interactive Patient Education ©2016 [employer] Inc[employer]

## 2016-02-04 NOTE — MAU Provider Note (Addendum)
History     CSN: [id number]  Arrival date and time: 02/04/16 0453   None     No chief complaint on file.  HPI Ms Brittany [name] is a Brittany Kussmaul34yo [id number] @ 24.3wks who presents for eval of H/A, nausea, and stomach pain since yesterday. She has not tried any medication at home. Denies RUQ pain or vomiting. No fever or dysuria. Denies ctx, leaking or bldg. [facility] and has been complicated by PTB x 1 and hx pre-e.  OB History    Gravida Para Term Preterm AB TAB SAB Ectopic Multiple Living   8 2 1 1 5 1 4  0 0 2      Past Medical History  Diagnosis Date  . Headache(784.0)   . Hypertension   . Pregnancy induced hypertension   . Vaginal Pap smear, abnormal   . Gallstone     Past Surgical History  Procedure Laterality Date  . Leep      Family History  Problem Relation Age of Onset  . Asthma Mother   . Hypertension Mother   . Asthma Maternal Grandmother     Social History  Substance Use Topics  . Smoking status: Never Smoker   . Smokeless tobacco: Never Used  . Alcohol Use: No    Allergies: No Known Allergies  Facility-administered medications prior to admission  Medication Dose Route Frequency Provider Last Rate Last Dose  . hydroxyprogesterone caproate (MAKENA) 250 mg/mL injection 250 mg  250 mg Intramuscular Weekly Brock Badharles A Harper, MD   250 mg at 02/01/16 1358   Prescriptions prior to admission  Medication Sig Dispense Refill Last Dose  . acetaminophen (TYLENOL) 500 MG tablet Take 1,000 mg by mouth every 6 (six) hours as needed for mild pain or headache.    Past Week at Unknown time  . Doxylamine-Pyridoxine (DICLEGIS) 10-10 MG TBEC Take 1-2 tablets by mouth 3 (three) times daily. Pt takes one tablet twice daily in the morning and at noon and two tablets at bedtime.   02/03/2016 at Unknown time  . Prenatal Vit-Fe Fumarate-FA (PRENATAL MULTIVITAMIN) TABS tablet Take 1 tablet by mouth daily.    02/03/2016 at Unknown time  . [DISCONTINUED]  butalbital-acetaminophen-caffeine (FIORICET) 50-325-40 MG tablet Take 1-2 tablets by mouth every 6 (six) hours as needed for headache. 45 tablet 4 02/03/2016 at Unknown time    ROS  No other pertinents other than what is listed in the HPI Physical Exam   Blood pressure 111/69, pulse 66, temperature 98.3 F (36.8 C), temperature source Oral, resp. rate 15, height 5\' 6"  (1.676 m), weight 178 lb (80.74 kg), last menstrual period 08/17/2015, SpO2 100 %.  Physical Exam  Constitutional: She is oriented to person, place, and time. She appears well-developed.  HENT:  Head: Normocephalic.  Neck: Normal range of motion.  Cardiovascular: Normal rate.   Respiratory: Effort normal.  GI:  EFM 130s, +accels, no decels No ctx  Musculoskeletal: Normal range of motion.  Neurological: She is alert and oriented to person, place, and time.  Skin: Skin is warm and dry.  Psychiatric: She has a normal mood and affect. Her behavior is normal. Thought content normal.   Urinalysis    Component Value Date/Time   COLORURINE YELLOW 02/04/2016 0505   APPEARANCEUR CLEAR 02/04/2016 0505   LABSPEC <1.005* 02/04/2016 0505   PHURINE 6.0 02/04/2016 0505   GLUCOSEU NEGATIVE 02/04/2016 0505   HGBUR NEGATIVE 02/04/2016 0505   BILIRUBINUR NEGATIVE 02/04/2016 0505   BILIRUBINUR neg 01/20/2016  1208   KETONESUR 15* 02/04/2016 0505   PROTEINUR NEGATIVE 02/04/2016 0505   PROTEINUR neg 01/20/2016 1208   UROBILINOGEN negative 01/20/2016 1208   UROBILINOGEN 0.2 05/12/2011 1025   NITRITE NEGATIVE 02/04/2016 0505   NITRITE neg 01/20/2016 1208   LEUKOCYTESUR NEGATIVE 02/04/2016 0505     MAU Course  Procedures  MDM NST read Given Zofran, Fioricet  Assessment and Plan  IUP@24 .3wks H/A in preg  Care passed off to [employer] PA  Care assumed from Philipp Deputy, CNM  Patient reports improvement in headache with Fioricet and denies nausea  A: SIUP at [redacted]w[redacted]d Headache Nausea without vomiting  P: Discharge home Rx  for Fioricet refilled for patient  Preterm labor precautions discussed Patient advised to follow-up with [facility] as scheduled for routine prenatal care  Patient may return to MAU as needed or if her condition were to change or worsen  Marny Lowenstein, PA-C  02/04/2016, 7:51 AM

## 2016-02-04 NOTE — MAU Note (Signed)
Pt reports headache, abd pain, and nausea since last pm. Denies fever.

## 2016-02-08 ENCOUNTER — Encounter: Payer: Self-pay | Admitting: *Deleted

## 2016-02-08 ENCOUNTER — Ambulatory Visit (INDEPENDENT_AMBULATORY_CARE_PROVIDER_SITE_OTHER): Payer: Medicaid Other | Admitting: *Deleted

## 2016-02-08 VITALS — BP 129/83 | HR 79 | Temp 98.7°F | Wt 178.2 lb

## 2016-02-08 DIAGNOSIS — O09892 Supervision of other high risk pregnancies, second trimester: Secondary | ICD-10-CM

## 2016-02-08 DIAGNOSIS — O09212 Supervision of pregnancy with history of pre-term labor, second trimester: Secondary | ICD-10-CM | POA: Diagnosis not present

## 2016-02-17 ENCOUNTER — Encounter: Payer: Medicaid Other | Admitting: Certified Nurse Midwife

## 2016-02-18 ENCOUNTER — Ambulatory Visit (INDEPENDENT_AMBULATORY_CARE_PROVIDER_SITE_OTHER): Payer: Medicaid Other | Admitting: Obstetrics

## 2016-02-18 VITALS — BP 116/74 | HR 82 | Temp 98.9°F | Wt 179.2 lb

## 2016-02-18 DIAGNOSIS — O0992 Supervision of high risk pregnancy, unspecified, second trimester: Secondary | ICD-10-CM

## 2016-02-18 LAB — POCT URINALYSIS DIPSTICK
Bilirubin, UA: NEGATIVE
Blood, UA: NEGATIVE
Glucose, UA: NEGATIVE
Ketones, UA: NEGATIVE
Nitrite, UA: NEGATIVE
SPEC GRAV UA: 1.02
UROBILINOGEN UA: 0.2
pH, UA: 6

## 2016-02-18 MED ORDER — HYDROXYPROGESTERONE CAPROATE 250 MG/ML IM OIL
250.0000 mg | TOPICAL_OIL | Freq: Once | INTRAMUSCULAR | Status: AC
  Administered 2016-02-18: 250 mg via INTRAMUSCULAR

## 2016-02-24 ENCOUNTER — Encounter (HOSPITAL_COMMUNITY): Payer: Self-pay | Admitting: *Deleted

## 2016-02-24 ENCOUNTER — Ambulatory Visit: Payer: Medicaid Other

## 2016-02-24 ENCOUNTER — Ambulatory Visit (INDEPENDENT_AMBULATORY_CARE_PROVIDER_SITE_OTHER): Payer: Medicaid Other | Admitting: Certified Nurse Midwife

## 2016-02-24 ENCOUNTER — Encounter: Payer: Self-pay | Admitting: Obstetrics

## 2016-02-24 ENCOUNTER — Inpatient Hospital Stay (HOSPITAL_COMMUNITY)
Admission: AD | Admit: 2016-02-24 | Discharge: 2016-02-24 | Disposition: A | Discharge status: Home / Self Care | Payer: Medicaid Other | Source: Ambulatory Visit | Attending: Obstetrics and Gynecology | Admitting: Obstetrics and Gynecology

## 2016-02-24 VITALS — BP 119/81 | HR 83 | Temp 98.5°F | Wt 180.0 lb

## 2016-02-24 DIAGNOSIS — O9989 Other specified diseases and conditions complicating pregnancy, childbirth and the puerperium: Secondary | ICD-10-CM

## 2016-02-24 DIAGNOSIS — Z825 Family history of asthma and other chronic lower respiratory diseases: Secondary | ICD-10-CM | POA: Diagnosis not present

## 2016-02-24 DIAGNOSIS — R109 Unspecified abdominal pain: Secondary | ICD-10-CM

## 2016-02-24 DIAGNOSIS — Z3492 Encounter for supervision of normal pregnancy, unspecified, second trimester: Secondary | ICD-10-CM

## 2016-02-24 DIAGNOSIS — E860 Dehydration: Secondary | ICD-10-CM

## 2016-02-24 DIAGNOSIS — O26892 Other specified pregnancy related conditions, second trimester: Secondary | ICD-10-CM | POA: Diagnosis not present

## 2016-02-24 DIAGNOSIS — O09212 Supervision of pregnancy with history of pre-term labor, second trimester: Secondary | ICD-10-CM | POA: Diagnosis not present

## 2016-02-24 DIAGNOSIS — M549 Dorsalgia, unspecified: Secondary | ICD-10-CM | POA: Diagnosis present

## 2016-02-24 DIAGNOSIS — Z3A27 27 weeks gestation of pregnancy: Secondary | ICD-10-CM | POA: Diagnosis not present

## 2016-02-24 DIAGNOSIS — Z8249 Family history of ischemic heart disease and other diseases of the circulatory system: Secondary | ICD-10-CM | POA: Insufficient documentation

## 2016-02-24 LAB — URINALYSIS, ROUTINE W REFLEX MICROSCOPIC
Bilirubin Urine: NEGATIVE
Glucose, UA: NEGATIVE mg/dL
HGB URINE DIPSTICK: NEGATIVE
Ketones, ur: 15 mg/dL — AB
NITRITE: NEGATIVE
PROTEIN: NEGATIVE mg/dL
Specific Gravity, Urine: 1.02 (ref 1.005–1.030)
pH: 6.5 (ref 5.0–8.0)

## 2016-02-24 LAB — URINE MICROSCOPIC-ADD ON

## 2016-02-24 MED ORDER — HYDROXYPROGESTERONE CAPROATE 250 MG/ML IM OIL
250.0000 mg | TOPICAL_OIL | Freq: Once | INTRAMUSCULAR | Status: AC
  Administered 2016-02-24: 250 mg via INTRAMUSCULAR

## 2016-02-24 MED ORDER — BUTALBITAL-APAP-CAFFEINE 50-325-40 MG PO TABS
1.0000 | ORAL_TABLET | Freq: Once | ORAL | Status: AC
  Administered 2016-02-24: 1 via ORAL
  Filled 2016-02-24: qty 1

## 2016-02-24 NOTE — Progress Notes (Signed)
Subjective:    Brittany [name] is a 35 y.o. female being seen today for her obstetrical visit. She is at 446w2d gestation. Patient reports: no complaints . Fetal movement: normal.  Problem List Items Addressed This Visit    Supervision of high risk pregnancy, antepartum - Primary   Relevant Medications   hydroxyprogesterone caproate (MAKENA) 250 mg/mL injection 250 mg (Completed)   Other Relevant Orders   POCT Urinalysis Dipstick (Completed)    Other Visit Diagnoses   None.    Patient Active Problem List   Diagnosis Date Noted  . Subclinical hyperthyroidism 12/09/2015  . Supervision of high risk pregnancy, antepartum 10/29/2015  . Pregnancy induced hypertension, delivered, current hospitalization 04/09/2011   Objective:    BP 116/74   Pulse 82   Temp 98.9 F (37.2 C)   Wt 179 lb 3.2 oz (81.3 kg)   LMP 08/17/2015   BMI 28.92 kg/m  FHT: 150 BPM  Uterine Size: size equals dates     Assessment:    Pregnancy @ 5646w2d    Plan:    OBGCT: ordered. Signs and symptoms of preterm labor: discussed.  Labs, problem list reviewed and updated 2 hr GTT planned Follow up in 2 weeks.

## 2016-02-24 NOTE — Progress Notes (Signed)
Pt c/o random abdominal cramping, denies pressure at this time. Patient left without being seen.

## 2016-02-24 NOTE — Discharge Instructions (Signed)
Please follow up with your doctor for your routine OB visits. In addition, be sure to drink plenty of water. You can time yourself to drink 64 oz in 1 hour if you are having cramping.

## 2016-02-24 NOTE — MAU Provider Note (Signed)
MAU HISTORY AND PHYSICAL  Chief Complaint:  Back Pain and Abdominal Pain   Brittany [name] is a 35 y.o.  [id number]  at [redacted]w[redacted]d presenting for Back Pain and Abdominal Pain . She says the back and abdominal pain started today and are coming and going, 8/10, lower back and lower abdomen. Tried tylenol with little relief. Endorses urinary frequency and slight dysuria. Patient states she has been having  none contractions, none vaginal bleeding, intact membranes, with active fetal movement.    Past Medical History:  Diagnosis Date  . Gallstone   . Headache(784.0)   . Hypertension   . Pregnancy induced hypertension   . Vaginal Pap smear, abnormal     Past Surgical History:  Procedure Laterality Date  . LEEP      Family History  Problem Relation Age of Onset  . Asthma Mother   . Hypertension Mother   . Asthma Maternal Grandmother     Social History  Substance Use Topics  . Smoking status: Never Smoker  . Smokeless tobacco: Never Used  . Alcohol use No    No Known Allergies  Facility-Administered Medications Prior to Admission  Medication Dose Route Frequency Provider Last Rate Last Dose  . hydroxyprogesterone caproate (MAKENA) 250 mg/mL injection 250 mg  250 mg Intramuscular Weekly Brock Bad, MD   250 mg at 02/08/16 1335   Prescriptions Prior to Admission  Medication Sig Dispense Refill Last Dose  . acetaminophen (TYLENOL) 500 MG tablet Take 1,000 mg by mouth every 6 (six) hours as needed for mild pain or headache.    Taking  . acetaminophen-codeine (TYLENOL #3) 300-30 MG tablet take 1 tablet by mouth every 6 hours if needed for pain  0   . amoxicillin (AMOXIL) 500 MG capsule take 1 capsule by mouth three times a day until finished  0   . butalbital-acetaminophen-caffeine (FIORICET) 50-325-40 MG tablet Take 1-2 tablets by mouth every 6 (six) hours as needed for headache. 20 tablet 0 Taking  . Doxylamine-Pyridoxine (DICLEGIS) 10-10 MG TBEC Take 1-2 tablets by mouth 3  (three) times daily. Pt takes one tablet twice daily in the morning and at noon and two tablets at bedtime.   Taking  . ibuprofen (ADVIL,MOTRIN) 600 MG tablet   0   . Prenatal Vit-Fe Fumarate-FA (PRENATAL MULTIVITAMIN) TABS tablet Take 1 tablet by mouth daily.    Taking    Review of Systems - Negative except for what is mentioned in HPI.  Physical Exam  Blood pressure 120/68, pulse 81, temperature 98.7 F (37.1 C), temperature source Oral, resp. rate 16, last menstrual period 08/17/2015. GENERAL: Well-developed, well-nourished female in no acute distress.  LUNGS: Clear to auscultation bilaterally.  HEART: Regular rate and rhythm. ABDOMEN: Soft, nontender, nondistended, mildly TPP over suprapubic region.  EXTREMITIES: Nontender, no edema, 2+ distal pulses. Mild CVA tenderness per patient, mild tenderness over spinous processes.  FHT:  Cat 1 Contractions: none   Labs: Results for orders placed or performed during the hospital encounter of 02/24/16 (from the past 24 hour(s))  Urinalysis, Routine w reflex microscopic (not at [facility])   Collection Time: 02/24/16  5:00 PM  Result Value Ref Range   Color, Urine YELLOW YELLOW   APPearance CLEAR CLEAR   Specific Gravity, Urine 1.020 1.005 - 1.030   pH 6.5 5.0 - 8.0   Glucose, UA NEGATIVE NEGATIVE mg/dL   Hgb urine dipstick NEGATIVE NEGATIVE   Bilirubin Urine NEGATIVE NEGATIVE   Ketones, ur 15 (A) NEGATIVE mg/dL  Protein, ur NEGATIVE NEGATIVE mg/dL   Nitrite NEGATIVE NEGATIVE   Leukocytes, UA TRACE (A) NEGATIVE  Urine microscopic-add on   Collection Time: 02/24/16  5:00 PM  Result Value Ref Range   Squamous Epithelial / LPF 0-5 (A) NONE SEEN   WBC, UA 0-5 0 - 5 WBC/hpf   RBC / HPF 0-5 0 - 5 RBC/hpf   Bacteria, UA RARE (A) NONE SEEN    Imaging Studies:  No results found.  Assessment: Brittany [name] is  35 y.o. [phone] at 622w2d presents with Back Pain and Abdominal Pain . MAU course: In the any U patient did receive Fioricet and  was able to peel hydrate almost 64 ounces and 1 hour. Patient reported marked improvement in her symptoms and was ready for discharge on reevaluation.  Plan: Lower back and abdominal pain: ketones on urine, rare bacteria and no nitrites. Will aggressively PO hydrate and give Fioricet. She felt much better after drinking 64 oz of water and taking fioricet. She has fioricet from a previous MAU visit and will try this in the future if necessary.   Loni MuseKate Timberlake 8/9/20176:21 PM  OB FELLOW MAU ATTESTATION  I have seen and examined this patient; I agree with above documentation in the resident's note.    Ernestina Pennaicholas Harrison [facility] 02/24/2016, 8:45 PM

## 2016-02-24 NOTE — Progress Notes (Signed)
Patient did not stay for exam.  Stated to secretary that her husband had to go to work.  Needs 2 hour OGTT in one week.  Secretary to schedule ROB/testing in one week.

## 2016-02-24 NOTE — MAU Note (Signed)
C/o lower back pain and lower abdominal cramping since 2300 last night; denies andy vaginal leaking or vaginal bleeding;

## 2016-03-02 ENCOUNTER — Other Ambulatory Visit: Payer: Medicaid Other

## 2016-03-02 ENCOUNTER — Ambulatory Visit (INDEPENDENT_AMBULATORY_CARE_PROVIDER_SITE_OTHER): Payer: Medicaid Other | Admitting: Certified Nurse Midwife

## 2016-03-02 VITALS — BP 132/80 | HR 82 | Wt 180.0 lb

## 2016-03-02 DIAGNOSIS — O09213 Supervision of pregnancy with history of pre-term labor, third trimester: Secondary | ICD-10-CM | POA: Diagnosis not present

## 2016-03-02 DIAGNOSIS — O0993 Supervision of high risk pregnancy, unspecified, third trimester: Secondary | ICD-10-CM

## 2016-03-02 DIAGNOSIS — Z8639 Personal history of other endocrine, nutritional and metabolic disease: Secondary | ICD-10-CM

## 2016-03-02 LAB — POCT URINALYSIS DIPSTICK
BILIRUBIN UA: NEGATIVE
GLUCOSE UA: NEGATIVE
Leukocytes, UA: NEGATIVE
NITRITE UA: NEGATIVE
Protein, UA: NEGATIVE
RBC UA: NEGATIVE
SPEC GRAV UA: 1.01
Urobilinogen, UA: NEGATIVE
pH, UA: 7

## 2016-03-02 MED ORDER — TETANUS-DIPHTH-ACELL PERTUSSIS 5-2.5-18.5 LF-MCG/0.5 IM SUSP
0.5000 mL | Freq: Once | INTRAMUSCULAR | Status: AC
  Administered 2016-03-02: 0.5 mL via INTRAMUSCULAR

## 2016-03-02 NOTE — Addendum Note (Signed)
Addended by: Marya LandryFOSTER, Dontavion Noxon D on: 03/02/2016 11:09 AM   Modules accepted: Orders

## 2016-03-02 NOTE — Progress Notes (Signed)
Subjective:    Brittany [name] is a 35 y.o. female being seen today for her obstetrical visit. She is at 7629w2d gestation. Patient reports no complaints. Fetal movement: normal.  Problem List Items Addressed This Visit    None    Visit Diagnoses    Supervision of high risk pregnancy in third trimester    -  Primary   Relevant Orders   POCT urinalysis dipstick (Completed)   Glucose Tolerance, 2 Hours w/1 Hour   CBC   HIV antibody   RPR     Patient Active Problem List   Diagnosis Date Noted  . Subclinical hyperthyroidism 12/09/2015  . Supervision of high risk pregnancy, antepartum 10/29/2015  . Pregnancy induced hypertension, delivered, current hospitalization 04/09/2011   Objective:    BP 132/80   Pulse 82   Wt 180 lb (81.6 kg)   LMP 08/17/2015   BMI 29.05 kg/m  FHT:  140 BPM  Uterine Size: 28 cm and size equals dates  Presentation: cephalic     Assessment:    Pregnancy @ 5529w2d weeks   H/O hyperthyroidism  TDaP today  Hx of PTD: on 17-p injections  Plan:   2 hour OGTT today   Has endocrinology appointment scheduled in Sept. With Dr. Fransico HimNida   labs reviewed, problem list updated Consent signed. GBS planning TDAP offered, given  Pediatrician: discussed. Infant feeding: plans to breastfeed. Maternity leave: discussed, states will bring forms to next appointment. Cigarette smoking: never smoked. Orders Placed This Encounter  Procedures  . Glucose Tolerance, 2 Hours w/1 Hour  . CBC  . HIV antibody  . RPR  . POCT urinalysis dipstick   No orders of the defined types were placed in this encounter.  Follow up in 2 Weeks; 1 week for 17-P injection.

## 2016-03-02 NOTE — Assessment & Plan Note (Addendum)
  [facility]inic  [facility] [place] Prenatal Labs  Dating  LMP Blood type: O/Positive/-- (04/13 1417)   Genetic Screen 1 Screen: 11/20/15 @[facility]FM  normal  AFP:     Quad:     NIPS: Antibody:Negative (04/13 1417)  Anatomic US  Normal @18wks  Rubella: 2.58 (04/13 1417)  GTT Early:               Third trimester:  RPR: Non Reactive (04/13 1417)   Flu vaccine  HBsAg: Negative (04/13 1417)   TDaP vaccine    03/02/16                                Rhogam: N/A HIV: Non Reactive (04/13 1417)   Baby Food         breast                                      GBS: (For PCN allergy, check sensitivities)  Contraception  Nexplanon Pap:  Circumcision  [facility]  Hx of PTD: on 17-P  Pediatrician  [facility] [place]   Support Person

## 2016-03-03 ENCOUNTER — Encounter: Payer: Medicaid Other | Admitting: Certified Nurse Midwife

## 2016-03-03 LAB — CBC
HEMATOCRIT: 33.6 % — AB (ref 34.0–46.6)
HEMOGLOBIN: 11.1 g/dL (ref 11.1–15.9)
MCH: 28.1 pg (ref 26.6–33.0)
MCHC: 33 g/dL (ref 31.5–35.7)
MCV: 85 fL (ref 79–97)
Platelets: 238 10*3/uL (ref 150–379)
RBC: 3.95 x10E6/uL (ref 3.77–5.28)
RDW: 13.6 % (ref 12.3–15.4)
WBC: 10.9 10*3/uL — ABNORMAL HIGH (ref 3.4–10.8)

## 2016-03-03 LAB — GLUCOSE TOLERANCE, 2 HOURS W/ 1HR
Glucose, 1 hour: 130 mg/dL (ref 65–179)
Glucose, 2 hour: 110 mg/dL (ref 65–152)
Glucose, Fasting: 73 mg/dL (ref 65–91)

## 2016-03-03 LAB — HIV ANTIBODY (ROUTINE TESTING W REFLEX): HIV Screen 4th Generation wRfx: NONREACTIVE

## 2016-03-03 LAB — RPR: RPR: NONREACTIVE

## 2016-03-05 LAB — THYROID PANEL WITH TSH
FREE THYROXINE INDEX: 1.9 (ref 1.2–4.9)
T3 UPTAKE RATIO: 16 % — AB (ref 24–39)
T4, Total: 11.9 ug/dL (ref 4.5–12.0)
TSH: 0.688 u[IU]/mL (ref 0.450–4.500)

## 2016-03-05 LAB — SPECIMEN STATUS REPORT

## 2016-03-08 ENCOUNTER — Other Ambulatory Visit: Payer: Self-pay | Admitting: Certified Nurse Midwife

## 2016-03-09 ENCOUNTER — Ambulatory Visit (INDEPENDENT_AMBULATORY_CARE_PROVIDER_SITE_OTHER): Payer: Medicaid Other | Admitting: *Deleted

## 2016-03-09 VITALS — BP 123/81 | HR 87

## 2016-03-09 DIAGNOSIS — O0993 Supervision of high risk pregnancy, unspecified, third trimester: Secondary | ICD-10-CM

## 2016-03-09 DIAGNOSIS — O09213 Supervision of pregnancy with history of pre-term labor, third trimester: Secondary | ICD-10-CM

## 2016-03-09 MED ORDER — HYDROXYPROGESTERONE CAPROATE 250 MG/ML IM OIL
250.0000 mg | TOPICAL_OIL | Freq: Once | INTRAMUSCULAR | Status: AC
  Administered 2016-03-09: 250 mg via INTRAMUSCULAR

## 2016-03-16 ENCOUNTER — Ambulatory Visit (INDEPENDENT_AMBULATORY_CARE_PROVIDER_SITE_OTHER): Payer: Medicaid Other | Admitting: Certified Nurse Midwife

## 2016-03-16 ENCOUNTER — Encounter: Payer: Self-pay | Admitting: Certified Nurse Midwife

## 2016-03-16 VITALS — BP 131/83 | HR 90 | Temp 98.7°F | Wt 178.2 lb

## 2016-03-16 DIAGNOSIS — Z3493 Encounter for supervision of normal pregnancy, unspecified, third trimester: Secondary | ICD-10-CM

## 2016-03-16 LAB — POCT URINALYSIS DIPSTICK
Bilirubin, UA: NEGATIVE
Blood, UA: NEGATIVE
Glucose, UA: NEGATIVE
Ketones, UA: NEGATIVE
Leukocytes, UA: NEGATIVE
Nitrite, UA: NEGATIVE
Protein, UA: NEGATIVE
Spec Grav, UA: <=1.005
Urobilinogen, UA: NEGATIVE
pH, UA: 7

## 2016-03-16 NOTE — Progress Notes (Signed)
Patient reports pressure and cramping. Patient did notice mucus discharge yesterday.

## 2016-03-16 NOTE — Progress Notes (Signed)
Subjective:    Brittany [name] is a 35 y.o. female being seen today for her obstetrical visit. She is at 8335w2d gestation. Patient reports no complaints. Fetal movement: normal.  States that she has a HA, has not taken anything for it.  Has Fioricet.  Denies any vision changes or upper gastric pain.    Problem List Items Addressed This Visit    None    Visit Diagnoses    Prenatal care, third trimester    -  Primary   Relevant Orders   POCT urinalysis dipstick (Completed)     Patient Active Problem List   Diagnosis Date Noted  . Subclinical hyperthyroidism 12/09/2015  . Supervision of high risk pregnancy, antepartum 10/29/2015  . Pregnancy induced hypertension, delivered, current hospitalization 04/09/2011   Objective:    BP 131/83   Pulse 90   Temp 98.7 F (37.1 C)   Wt 178 lb 3.2 oz (80.8 kg)   LMP 08/17/2015   BMI 28.76 kg/m  FHT:  136 BPM  Uterine Size: 30 cm and size equals dates  Presentation: cephalic     Assessment:    Pregnancy @ 7135w2d weeks   H/O PIH & PTD: on 17-P injections  H/O cholestasis this pregnancy: no current symtpoms Plan:    Weekly 17-P injections   labs reviewed, problem list updated Consent signed. GBS planning TDAP offered  Rhogam given for RH negative Pediatrician: discussed. Infant feeding: plans to breastfeed. Maternity leave: discussed, forms done. Cigarette smoking: never smoked. Orders Placed This Encounter  Procedures  . POCT urinalysis dipstick   No orders of the defined types were placed in this encounter.  Follow up in 2 Weeks.

## 2016-03-18 ENCOUNTER — Ambulatory Visit (INDEPENDENT_AMBULATORY_CARE_PROVIDER_SITE_OTHER): Payer: Medicaid Other | Admitting: Obstetrics

## 2016-03-18 ENCOUNTER — Encounter: Payer: Self-pay | Admitting: Obstetrics

## 2016-03-18 ENCOUNTER — Inpatient Hospital Stay (HOSPITAL_COMMUNITY)
Admission: AD | Admit: 2016-03-18 | Discharge: 2016-03-18 | Disposition: A | Discharge status: Home / Self Care | Payer: Medicaid Other | Source: Ambulatory Visit | Attending: Obstetrics & Gynecology | Admitting: Obstetrics & Gynecology

## 2016-03-18 ENCOUNTER — Encounter (HOSPITAL_COMMUNITY): Payer: Self-pay | Admitting: *Deleted

## 2016-03-18 VITALS — BP 130/82 | HR 91 | Temp 98.6°F | Wt 176.8 lb

## 2016-03-18 DIAGNOSIS — R110 Nausea: Secondary | ICD-10-CM | POA: Insufficient documentation

## 2016-03-18 DIAGNOSIS — G43909 Migraine, unspecified, not intractable, without status migrainosus: Secondary | ICD-10-CM

## 2016-03-18 DIAGNOSIS — O163 Unspecified maternal hypertension, third trimester: Secondary | ICD-10-CM | POA: Diagnosis not present

## 2016-03-18 DIAGNOSIS — Z3A30 30 weeks gestation of pregnancy: Secondary | ICD-10-CM | POA: Insufficient documentation

## 2016-03-18 DIAGNOSIS — O26893 Other specified pregnancy related conditions, third trimester: Secondary | ICD-10-CM | POA: Diagnosis present

## 2016-03-18 DIAGNOSIS — O9989 Other specified diseases and conditions complicating pregnancy, childbirth and the puerperium: Secondary | ICD-10-CM | POA: Diagnosis not present

## 2016-03-18 DIAGNOSIS — R51 Headache: Secondary | ICD-10-CM | POA: Diagnosis present

## 2016-03-18 DIAGNOSIS — Z3493 Encounter for supervision of normal pregnancy, unspecified, third trimester: Secondary | ICD-10-CM

## 2016-03-18 DIAGNOSIS — Z9889 Other specified postprocedural states: Secondary | ICD-10-CM | POA: Insufficient documentation

## 2016-03-18 DIAGNOSIS — Z79899 Other long term (current) drug therapy: Secondary | ICD-10-CM | POA: Diagnosis not present

## 2016-03-18 DIAGNOSIS — G43709 Chronic migraine without aura, not intractable, without status migrainosus: Secondary | ICD-10-CM

## 2016-03-18 HISTORY — DX: Migraine, unspecified, not intractable, without status migrainosus: G43.909

## 2016-03-18 LAB — URINALYSIS, ROUTINE W REFLEX MICROSCOPIC
BILIRUBIN URINE: NEGATIVE
Glucose, UA: NEGATIVE mg/dL
Hgb urine dipstick: NEGATIVE
KETONES UR: NEGATIVE mg/dL
Leukocytes, UA: NEGATIVE
NITRITE: NEGATIVE
PROTEIN: NEGATIVE mg/dL
Specific Gravity, Urine: 1.015 (ref 1.005–1.030)
pH: 7.5 (ref 5.0–8.0)

## 2016-03-18 LAB — POCT URINALYSIS DIPSTICK
Bilirubin, UA: NEGATIVE
Blood, UA: NEGATIVE
Glucose, UA: NEGATIVE
Ketones, UA: NEGATIVE
Leukocytes, UA: NEGATIVE
Nitrite, UA: NEGATIVE
Protein, UA: NEGATIVE
Spec Grav, UA: 1.015
Urobilinogen, UA: 0.2
pH, UA: 8

## 2016-03-18 MED ORDER — METOCLOPRAMIDE HCL 10 MG PO TABS
10.0000 mg | ORAL_TABLET | Freq: Once | ORAL | Status: AC
  Administered 2016-03-18: 10 mg via ORAL
  Filled 2016-03-18: qty 1

## 2016-03-18 MED ORDER — PROMETHAZINE HCL 25 MG PO TABS
25.0000 mg | ORAL_TABLET | Freq: Once | ORAL | Status: AC
  Administered 2016-03-18: 25 mg via ORAL
  Filled 2016-03-18: qty 1

## 2016-03-18 MED ORDER — ACETAMINOPHEN 500 MG PO TABS
1000.0000 mg | ORAL_TABLET | Freq: Once | ORAL | Status: AC
  Administered 2016-03-18: 1000 mg via ORAL
  Filled 2016-03-18: qty 2

## 2016-03-18 MED ORDER — METOCLOPRAMIDE HCL 10 MG PO TABS: 10.0000 mg | ORAL_TABLET | Freq: Four times a day (QID) | ORAL | 0 refills | Status: DC | PRN

## 2016-03-18 NOTE — MAU Note (Signed)
Patient presents with onset of headache and nausea since this morning.

## 2016-03-18 NOTE — MAU Note (Signed)
Pt states she had a HA last night and took fioricet, states the HA got a little bit better. This morning is got worse about 0700 which pt took tylenol for.  Pt was seen in Dr. Thomes LollingHarpers office today.  Pt was told to come to MAU since she had HA and top number for BP was 130's.  Good fetal movement.  Denies vaginal bleeding, ROM or discharge.

## 2016-03-18 NOTE — Progress Notes (Signed)
Subjective:    Brittany [name] is a 35 y.o. female being seen today for her obstetrical visit. She is at 537w4d gestation. Patient reports headache and nausea. Fetal movement: normal.  Problem List Items Addressed This Visit    None    Visit Diagnoses    Prenatal care, third trimester    -  Primary   Relevant Orders   POCT Urinalysis Dipstick (Completed)     Patient Active Problem List   Diagnosis Date Noted  . Subclinical hyperthyroidism 12/09/2015  . Supervision of high risk pregnancy, antepartum 10/29/2015  . Pregnancy induced hypertension, delivered, current hospitalization 04/09/2011   Objective:    BP 130/82   Pulse 91   Temp 98.6 F (37 C)   Wt 176 lb 12.8 oz (80.2 kg)   LMP 08/17/2015   BMI 28.54 kg/m    Assessment:    Pregnancy @ 247w4d weeks    History of PIH and PTD  Severe Headache and Nausea  Plan:    [facility]   labs reviewed, problem list updated Consent signed. GBS sent TDAP offered  Rhogam given for RH negative Pediatrician: discussed. Infant feeding: plans to breastfeed. Maternity leave: discussed. Cigarette smoking: never smoked. Orders Placed This Encounter  Procedures  . POCT Urinalysis Dipstick   No orders of the defined types were placed in this encounter.  Follow up in 1 Week.

## 2016-03-18 NOTE — Discharge Instructions (Signed)
Migraine Headache  A migraine headache is very bad, throbbing pain on one or both sides of your head. Talk to your doctor about what things may bring on (trigger) your migraine headaches.  HOME CARE  · Only take medicines as told by your doctor.  · Lie down in a dark, quiet room when you have a migraine.  · Keep a journal to find out if certain things bring on migraine headaches. For example, write down:    What you eat and drink.    How much sleep you get.    Any change to your diet or medicines.  · Lessen how much alcohol you drink.  · Quit smoking if you smoke.  · Get enough sleep.  · Lessen any stress in your life.  · Keep lights dim if bright lights bother you or make your migraines worse.  GET HELP RIGHT AWAY IF:   · Your migraine becomes really bad.  · You have a fever.  · You have a stiff neck.  · You have trouble seeing.  · Your muscles are weak, or you lose muscle control.  · You lose your balance or have trouble walking.  · You feel like you will pass out (faint), or you pass out.  · You have really bad symptoms that are different than your first symptoms.  MAKE SURE YOU:   · Understand these instructions.  · Will watch your condition.  · Will get help right away if you are not doing well or get worse.     This information is not intended to replace advice given to you by your health care provider. Make sure you discuss any questions you have with your health care provider.     Document Released: 04/12/2008 Document Revised: 09/26/2011 Document Reviewed: 03/11/2013  Elsevier Interactive Patient Education ©2016 [employer] Inc[employer]

## 2016-03-18 NOTE — MAU Provider Note (Signed)
Chief Complaint:  Headache and Nausea   First Provider Initiated Contact with Patient 03/18/16 1230     HPI: Brittany [name] is a 35 y.o. [id number] at 6330w4dwho presents to maternity admissions reporting headache and nausea.  Fioricet helped last night, but headache returned today.  Has not tried any more fioricet.  Was sent by office for borderline BP. She reports good fetal movement, denies LOF, vaginal bleeding, vaginal itching/burning, urinary symptoms,  dizziness, diarrhea, constipation or fever/chills.    Headache   This is a recurrent problem. The current episode started yesterday. The problem occurs constantly. The problem has been unchanged. The pain is located in the bilateral and frontal region. The pain does not radiate. The pain quality is similar to prior headaches. The quality of the pain is described as aching and dull. The pain is moderate. Associated symptoms include nausea. Pertinent negatives include no abdominal pain, back pain, blurred vision, dizziness, fever, numbness, photophobia, sinus pressure or visual change. Nothing aggravates the symptoms. She has tried acetaminophen for the symptoms. The treatment provided no relief.    RN Note: Pt states she had a HA last night and took fioricet, states the HA got a little bit better. This morning is got worse about 0700 which pt took tylenol for.  Pt was seen in Dr. Thomes LollingHarpers office today.  Pt was told to come to MAU since she had HA and top number for BP was 130's.  Good fetal movement.  Denies vaginal bleeding, ROM or discharge.  Past Medical History: Past Medical History:  Diagnosis Date  . Gallstone   . Headache(784.0)   . Hypertension   . Pregnancy induced hypertension   . Vaginal Pap smear, abnormal     Past obstetric history: OB History  Gravida Para Term Preterm AB Living  8 2 1 1 5 2   SAB TAB Ectopic Multiple Live Births  4 1 0 0 1    # Outcome Date GA Lbr Len/2nd Weight Sex Delivery Anes PTL Lv  8 Current            7 Preterm 04/06/11 3614w6d 08:25 / 00:08 5 lb 6.6 oz (2.455 kg) F Vag-Spont None  LIV  6 TAB           5 SAB           4 SAB           3 SAB           2 SAB           1 Term  3167w0d    Vag-Spont         Past Surgical History: Past Surgical History:  Procedure Laterality Date  . LEEP      Family History: Family History  Problem Relation Age of Onset  . Asthma Mother   . Hypertension Mother   . Asthma Maternal Grandmother     Social History: Social History  Substance Use Topics  . Smoking status: Never Smoker  . Smokeless tobacco: Never Used  . Alcohol use No    Allergies: No Known Allergies  Meds:  Facility-Administered Medications Prior to Admission  Medication Dose Route Frequency Provider Last Rate Last Dose  . hydroxyprogesterone caproate (MAKENA) 250 mg/mL injection 250 mg  250 mg Intramuscular Weekly Brock Badharles A Harper, MD   250 mg at 03/16/16 1401   Prescriptions Prior to Admission  Medication Sig Dispense Refill Last Dose  . acetaminophen (TYLENOL) 500 MG tablet Take 1,000 mg by  mouth every 6 (six) hours as needed for mild pain or headache.    Taking  . butalbital-acetaminophen-caffeine (FIORICET) 50-325-40 MG tablet Take 1-2 tablets by mouth every 6 (six) hours as needed for headache. 20 tablet 0 Taking  . Doxylamine-Pyridoxine (DICLEGIS) 10-10 MG TBEC Take 1-2 tablets by mouth 3 (three) times daily. Pt takes one tablet twice daily in the morning and at noon and two tablets at bedtime.   Taking  . Prenatal Vit-Fe Fumarate-FA (PRENATAL MULTIVITAMIN) TABS tablet Take 1 tablet by mouth daily.    Taking    I have reviewed patient's Past Medical Hx, Surgical Hx, Family Hx, Social Hx, medications and allergies.   ROS:  Review of Systems  Constitutional: Negative for fever.  HENT: Negative for sinus pressure.   Eyes: Negative for blurred vision and photophobia.  Gastrointestinal: Positive for nausea. Negative for abdominal pain.  Musculoskeletal: Negative for  back pain.  Neurological: Positive for headaches. Negative for dizziness and numbness.   Other systems negative  Physical Exam  Patient Vitals for the past 24 hrs:  BP Temp Pulse Resp Height Weight  03/18/16 1231 113/71 - 79 - - -  03/18/16 1220 114/71 - 82 - - -  03/18/16 1203 128/76 98.7 F (37.1 C) 97 16 5\' 6"  (1.676 m) 177 lb 0.6 oz (80.3 kg)   Constitutional: Well-developed, well-nourished female in no acute distress.  Cardiovascular: normal rate and rhythm Respiratory: normal effort, clear to auscultation bilaterally GI: Abd soft, non-tender, gravid appropriate for gestational age.   No rebound or guarding. MS: Extremities nontender, no edema, normal ROM Neurologic: Alert and oriented x 4.  GU: Neg CVAT.  FHT:  Baseline 140 , moderate variability, accelerations present, no decelerations Contractions:   Rare   Labs: Results for orders placed or performed in visit on 03/18/16 (from the past 24 hour(s))  POCT Urinalysis Dipstick     Status: Normal   Collection Time: 03/18/16 11:34 AM  Result Value Ref Range   Color, UA Pale    Clarity, UA Clear    Glucose, UA Neg    Bilirubin, UA Neg    Ketones, UA Neg    Spec Grav, UA 1.015    Blood, UA Neg    pH, UA 8.0    Protein, UA Neg    Urobilinogen, UA 0.2    Nitrite, UA Neg    Leukocytes, UA Negative Negative   O/Positive/-- (04/13 1417)  Imaging:  No results found.  MAU Course/MDM: I have ordered labs and reviewed results.  NST reviewed BPs all within normal limits here  Reglan given which helped her nausea but headache was still a "6' Tylenol and Phenergan added which provided almost total relief  Assessment: SIUP at [redacted]w[redacted]d Migraine headache, resolved Nausea, resolved  Plan: Discharge home Rx Reglan for PRN use for headaches, advised try Fioricet first, cautioned about dystonic reaction (had none here) Preterm Labor precautions and fetal kick counts Follow up in Office for prenatal visits and recheck of  status   Pt stable at time of discharge.  Encouraged to return here or to other Urgent Care/ED if she develops worsening of symptoms, increase in pain, fever, or other concerning symptoms.      Wynelle Bourgeois CNM, MSN Certified Nurse-Midwife 03/18/2016 12:32 PM

## 2016-03-18 NOTE — Progress Notes (Signed)
Pt c/o severe HA since she woke up this am, APAP did not help. She c/o nausea

## 2016-03-25 ENCOUNTER — Ambulatory Visit: Payer: Medicaid Other

## 2016-03-25 NOTE — Progress Notes (Unsigned)
Pt is in office for 17p injection.  Pt states that she is needing something for seasonal allergies. Pt was advised of safe OTC medication to take. Will check with Rachelle for Claritin Rx.  Pt advised if symptoms worsen to call office.  Pt states understanding.  Administrations This Visit    hydroxyprogesterone caproate (MAKENA) 250 mg/mL injection 250 mg    Admin Date 03/25/2016 Action Given Dose 250 mg Route Intramuscular Administered By Lanney GinsSuzanne D Sender Rueb, CMA

## 2016-03-30 ENCOUNTER — Ambulatory Visit: Payer: Medicaid Other | Admitting: "Endocrinology

## 2016-03-30 ENCOUNTER — Other Ambulatory Visit: Payer: Self-pay | Admitting: "Endocrinology

## 2016-03-30 LAB — T4, FREE: Free T4: 1.1 ng/dL (ref 0.8–1.8)

## 2016-03-30 LAB — TSH: TSH: 0.44 m[IU]/L

## 2016-04-01 ENCOUNTER — Encounter: Payer: Self-pay | Admitting: Obstetrics

## 2016-04-01 ENCOUNTER — Ambulatory Visit (INDEPENDENT_AMBULATORY_CARE_PROVIDER_SITE_OTHER): Payer: Medicaid Other | Admitting: Obstetrics

## 2016-04-01 ENCOUNTER — Encounter: Payer: Self-pay | Admitting: *Deleted

## 2016-04-01 VITALS — BP 119/82 | HR 83 | Temp 98.7°F | Wt 183.7 lb

## 2016-04-01 DIAGNOSIS — Z3493 Encounter for supervision of normal pregnancy, unspecified, third trimester: Secondary | ICD-10-CM

## 2016-04-01 MED ORDER — HYDROXYPROGESTERONE CAPROATE 250 MG/ML IM OIL
250.0000 mg | TOPICAL_OIL | Freq: Once | INTRAMUSCULAR | Status: AC
  Administered 2016-04-01: 250 mg via INTRAMUSCULAR

## 2016-04-01 NOTE — Progress Notes (Signed)
Subjective:    Brittany [name] is a 35 y.o. female being seen today for her obstetrical visit. She is at 6147w4d gestation. Patient reports no complaints. Fetal movement: normal.  Problem List Items Addressed This Visit    None    Visit Diagnoses    Prenatal care, third trimester    -  Primary     Patient Active Problem List   Diagnosis Date Noted  . Migraine 03/18/2016  . Subclinical hyperthyroidism 12/09/2015  . Supervision of high risk pregnancy, antepartum 10/29/2015  . Pregnancy induced hypertension, delivered, current hospitalization 04/09/2011   Objective:    BP 119/82   Pulse 83   Temp 98.7 F (37.1 C)   Wt 183 lb 11.2 oz (83.3 kg)   LMP 08/17/2015   BMI 29.65 kg/m  FHT:  150 BPM  Uterine Size: size equals dates  Presentation: unsure     Assessment:    Pregnancy @ 647w4d weeks   Plan:     labs reviewed, problem list updated Consent signed. GBS sent TDAP offered  Rhogam given for RH negative Pediatrician: discussed. Infant feeding: plans to breastfeed. Maternity leave: discussed. Cigarette smoking: never smoked. No orders of the defined types were placed in this encounter.  Meds ordered this encounter  Medications  . hydroxyprogesterone caproate (MAKENA) 250 mg/mL injection 250 mg   Follow up in 1 Week.

## 2016-04-01 NOTE — Progress Notes (Signed)
Patient in office today for ROB and 17p injection. Pt. Tolerated well, no adverse reaction noted.  BP 119/82   Pulse 83   Temp 98.7 F (37.1 C)   Wt 183 lb 11.2 oz (83.3 kg)   LMP 08/17/2015   BMI 29.65 kg/m

## 2016-04-03 ENCOUNTER — Inpatient Hospital Stay (HOSPITAL_COMMUNITY)
Admission: AD | Admit: 2016-04-03 | Discharge: 2016-04-03 | Disposition: A | Discharge status: Home / Self Care | Payer: Medicaid Other | Source: Ambulatory Visit | Attending: Obstetrics and Gynecology | Admitting: Obstetrics and Gynecology

## 2016-04-03 ENCOUNTER — Encounter (HOSPITAL_COMMUNITY): Payer: Self-pay | Admitting: *Deleted

## 2016-04-03 DIAGNOSIS — G43009 Migraine without aura, not intractable, without status migrainosus: Secondary | ICD-10-CM

## 2016-04-03 DIAGNOSIS — O99353 Diseases of the nervous system complicating pregnancy, third trimester: Secondary | ICD-10-CM | POA: Insufficient documentation

## 2016-04-03 DIAGNOSIS — G43909 Migraine, unspecified, not intractable, without status migrainosus: Secondary | ICD-10-CM | POA: Insufficient documentation

## 2016-04-03 DIAGNOSIS — Z3A32 32 weeks gestation of pregnancy: Secondary | ICD-10-CM | POA: Insufficient documentation

## 2016-04-03 LAB — URINALYSIS, ROUTINE W REFLEX MICROSCOPIC
Bilirubin Urine: NEGATIVE
GLUCOSE, UA: NEGATIVE mg/dL
Hgb urine dipstick: NEGATIVE
Ketones, ur: NEGATIVE mg/dL
Leukocytes, UA: NEGATIVE
Nitrite: NEGATIVE
PH: 6.5 (ref 5.0–8.0)
PROTEIN: NEGATIVE mg/dL
Specific Gravity, Urine: 1.01 (ref 1.005–1.030)

## 2016-04-03 MED ORDER — BUTALBITAL-APAP-CAFFEINE 50-325-40 MG PO TABS: 1.0000 | ORAL_TABLET | Freq: Four times a day (QID) | ORAL | 0 refills | Status: DC | PRN

## 2016-04-03 MED ORDER — METOCLOPRAMIDE HCL 10 MG PO TABS
10.0000 mg | ORAL_TABLET | Freq: Once | ORAL | Status: AC
  Administered 2016-04-03: 10 mg via ORAL
  Filled 2016-04-03: qty 1

## 2016-04-03 NOTE — MAU Note (Signed)
Pt states she woke up with a really bad headache and took two Tylenol around 0700 and it still has not helped.

## 2016-04-03 NOTE — MAU Provider Note (Signed)
History   [id number] @ 32.6 wks in with migraine headache this is a ongoing problem for this pt. Pain is aching and dull, she is light sensitve and it started upon waking this morning. It is unrelieved by tylenol. Pt states fioricet usually works but is out.  CSN: [id number]  Arrival date & time 04/03/16  0931   None     Chief Complaint  Patient presents with  . Headache    HPI  Past Medical History:  Diagnosis Date  . Gallstone   . Headache(784.0)   . Hypertension   . Pregnancy induced hypertension   . Vaginal Pap smear, abnormal     Past Surgical History:  Procedure Laterality Date  . LEEP      Family History  Problem Relation Age of Onset  . Asthma Mother   . Hypertension Mother   . Asthma Maternal Grandmother     Social History  Substance Use Topics  . Smoking status: Never Smoker  . Smokeless tobacco: Never Used  . Alcohol use No    OB History    Gravida Para Term Preterm AB Living   8 2 1 1 5 2    SAB TAB Ectopic Multiple Live Births   4 1 0 0 2      Review of Systems  Constitutional: Negative.   Eyes: Negative.   Respiratory: Negative.   Cardiovascular: Negative.   Gastrointestinal: Negative.   Endocrine: Negative.   Genitourinary: Negative.   Musculoskeletal: Negative.   Skin: Negative.   Neurological: Positive for headaches.  Hematological: Negative.   Psychiatric/Behavioral: Negative.     Allergies  Review of patient's allergies indicates no known allergies.  Home Medications    BP 112/73 (BP Location: Left Arm)   Pulse 78   Temp 97.7 F (36.5 C) (Oral)   Resp 16   LMP 08/17/2015   Physical Exam  Constitutional: She is oriented to person, place, and time. She appears well-developed and well-nourished.  HENT:  Head: Normocephalic.  Eyes: Pupils are equal, round, and reactive to light.  Neck: Normal range of motion.  Cardiovascular: Normal rate, regular rhythm, normal heart sounds and intact distal pulses.   Pulmonary/Chest:  Effort normal and breath sounds normal.  Abdominal: Soft. Bowel sounds are normal.  Musculoskeletal: Normal range of motion.  Neurological: She is alert and oriented to person, place, and time. She has normal reflexes.  Skin: Skin is warm and dry.  Psychiatric: She has a normal mood and affect. Her behavior is normal. Judgment and thought content normal.    MAU Course  Procedures (including critical care time)  Labs Reviewed  URINALYSIS, ROUTINE W REFLEX MICROSCOPIC (NOT AT [facility])   No results found.   No diagnosis found.    MDM  Migraine headache Pt drove herself and has no one to pick her up. Will try reglan and will send her home with Rx for fiorcet.

## 2016-04-03 NOTE — Discharge Instructions (Signed)

## 2016-04-03 NOTE — Progress Notes (Signed)
Provider notified of pt arrival to MAU.  Notified that pt is a G8P2 at 7743w6d.  Notified that pt is complaining of a headache that was not improved by the 2 Tylenol she took at 0700 this morning.  Provider notified that vital signs are within normal limits and that pt drove herself to the hospital.  Provider states she will come see the pt.

## 2016-04-07 ENCOUNTER — Telehealth: Payer: Self-pay | Admitting: *Deleted

## 2016-04-07 NOTE — Telephone Encounter (Signed)
Explained to patient the sharpe shooting pains in her vagina when she moves are normal.Braxton Hicks contractions do not hurt but if her abdomen gets hard like a rock and hurts go to MAU.

## 2016-04-08 ENCOUNTER — Encounter (HOSPITAL_COMMUNITY): Payer: Self-pay

## 2016-04-08 ENCOUNTER — Inpatient Hospital Stay (EMERGENCY_DEPARTMENT_HOSPITAL)
Admission: AD | Admit: 2016-04-08 | Discharge: 2016-04-09 | Disposition: A | Discharge status: Home / Self Care | Payer: Medicaid Other | Source: Ambulatory Visit | Attending: Obstetrics & Gynecology | Admitting: Obstetrics & Gynecology

## 2016-04-08 ENCOUNTER — Ambulatory Visit (INDEPENDENT_AMBULATORY_CARE_PROVIDER_SITE_OTHER): Payer: Medicaid Other | Admitting: Obstetrics

## 2016-04-08 ENCOUNTER — Encounter: Payer: Self-pay | Admitting: Obstetrics

## 2016-04-08 VITALS — BP 121/81 | HR 83 | Temp 97.6°F | Wt 182.5 lb

## 2016-04-08 DIAGNOSIS — O163 Unspecified maternal hypertension, third trimester: Secondary | ICD-10-CM | POA: Diagnosis not present

## 2016-04-08 DIAGNOSIS — R109 Unspecified abdominal pain: Secondary | ICD-10-CM | POA: Diagnosis not present

## 2016-04-08 DIAGNOSIS — O26893 Other specified pregnancy related conditions, third trimester: Secondary | ICD-10-CM | POA: Insufficient documentation

## 2016-04-08 DIAGNOSIS — Z3A33 33 weeks gestation of pregnancy: Secondary | ICD-10-CM | POA: Insufficient documentation

## 2016-04-08 DIAGNOSIS — Z3A34 34 weeks gestation of pregnancy: Secondary | ICD-10-CM | POA: Diagnosis not present

## 2016-04-08 DIAGNOSIS — O9989 Other specified diseases and conditions complicating pregnancy, childbirth and the puerperium: Secondary | ICD-10-CM

## 2016-04-08 DIAGNOSIS — Z79899 Other long term (current) drug therapy: Secondary | ICD-10-CM

## 2016-04-08 DIAGNOSIS — R102 Pelvic and perineal pain: Secondary | ICD-10-CM | POA: Insufficient documentation

## 2016-04-08 DIAGNOSIS — O4703 False labor before 37 completed weeks of gestation, third trimester: Secondary | ICD-10-CM | POA: Diagnosis not present

## 2016-04-08 DIAGNOSIS — Z3493 Encounter for supervision of normal pregnancy, unspecified, third trimester: Secondary | ICD-10-CM

## 2016-04-08 LAB — POCT URINALYSIS DIPSTICK
BILIRUBIN UA: NEGATIVE
GLUCOSE UA: 50
Ketones, UA: NEGATIVE
LEUKOCYTES UA: NEGATIVE
NITRITE UA: NEGATIVE
Protein, UA: NEGATIVE
RBC UA: NEGATIVE
Spec Grav, UA: 1.02
UROBILINOGEN UA: 0.2
pH, UA: 5

## 2016-04-08 LAB — URINALYSIS, ROUTINE W REFLEX MICROSCOPIC
Bilirubin Urine: NEGATIVE
Glucose, UA: NEGATIVE mg/dL
Hgb urine dipstick: NEGATIVE
Ketones, ur: NEGATIVE mg/dL
LEUKOCYTES UA: NEGATIVE
NITRITE: NEGATIVE
PH: 6 (ref 5.0–8.0)
Protein, ur: NEGATIVE mg/dL
SPECIFIC GRAVITY, URINE: 1.015 (ref 1.005–1.030)

## 2016-04-08 MED ORDER — PROMETHAZINE HCL 25 MG PO TABS
25.0000 mg | ORAL_TABLET | Freq: Once | ORAL | Status: AC
  Administered 2016-04-08: 25 mg via ORAL
  Filled 2016-04-08: qty 1

## 2016-04-08 MED ORDER — NIFEDIPINE 10 MG PO CAPS
10.0000 mg | ORAL_CAPSULE | ORAL | Status: AC
Start: 2016-04-08 — End: 2016-04-08
  Administered 2016-04-08 (×3): 10 mg via ORAL
  Filled 2016-04-08 (×3): qty 1

## 2016-04-08 MED ORDER — HYDROXYPROGESTERONE CAPROATE 250 MG/ML IM OIL
250.0000 mg | TOPICAL_OIL | Freq: Once | INTRAMUSCULAR | Status: AC
  Administered 2016-04-08: 250 mg via INTRAMUSCULAR

## 2016-04-08 NOTE — Progress Notes (Signed)
Notified of pt arrival in MAU. Will come see pt 

## 2016-04-08 NOTE — MAU Note (Signed)
Abd pain and pressure in vagina for several days. Diarrhea x1 an hour ago and nauseated. Denies LOF or bleeding.

## 2016-04-08 NOTE — Progress Notes (Signed)
  Patient in office for 17p injection, patient tolerated well, given in (l) hip.   BP 121/81   Pulse 83   Temp 97.6 F (36.4 C) (Oral)   Wt 182 lb 8 oz (82.8 kg)   LMP 08/17/2015   BMI 29.46 kg/m    Current Outpatient Prescriptions on File Prior to Visit  Medication Sig Dispense Refill  . acetaminophen (TYLENOL) 500 MG tablet Take 1,000 mg by mouth every 6 (six) hours as needed for mild pain or headache.     . butalbital-acetaminophen-caffeine (FIORICET) 50-325-40 MG tablet Take 1-2 tablets by mouth every 6 (six) hours as needed for headache. 20 tablet 0  . butalbital-acetaminophen-caffeine (FIORICET, ESGIC) 50-325-40 MG tablet Take 1-2 tablets by mouth every 6 (six) hours as needed for headache. 20 tablet 0  . metoCLOPramide (REGLAN) 10 MG tablet Take 1 tablet (10 mg total) by mouth every 6 (six) hours as needed for nausea. 30 tablet 0  . Prenatal Vit-Fe Fumarate-FA (PRENATAL MULTIVITAMIN) TABS tablet Take 1 tablet by mouth daily.      No current facility-administered medications on file prior to visit.

## 2016-04-08 NOTE — Progress Notes (Signed)
Subjective:    Brittany [name] is a 35 y.o. female being seen today for her obstetrical visit. She is at 7419w4d gestation. Patient reports backache, contractions since am and pressure. Fetal movement: normal.  Problem List Items Addressed This Visit    None    Visit Diagnoses    Prenatal care, third trimester    -  Primary   Relevant Orders   POCT urinalysis dipstick (Completed)     Patient Active Problem List   Diagnosis Date Noted  . Migraine 03/18/2016  . Subclinical hyperthyroidism 12/09/2015  . Supervision of high risk pregnancy, antepartum 10/29/2015  . Pregnancy induced hypertension, delivered, current hospitalization 04/09/2011   Objective:    BP 121/81   Pulse 83   Temp 97.6 F (36.4 C) (Oral)   Wt 182 lb 8 oz (82.8 kg)   LMP 08/17/2015   BMI 29.46 kg/m  FHT:  150 BPM  Uterine Size: size equals dates  Presentation: cephalic    NST:  Reactive.  Occasional UC. Cvx:  Long and closed and Vtx at -3  Assessment:    Pregnancy @ 8719w4d weeks   Plan:     labs reviewed, problem list updated Consent signed. GBS sent TDAP offered  Rhogam given for RH negative Pediatrician: discussed. Infant feeding: plans to breastfeed. Maternity leave: discussed. Cigarette smoking: never smoked. Orders Placed This Encounter  Procedures  . POCT urinalysis dipstick   Meds ordered this encounter  Medications  . hydroxyprogesterone caproate (MAKENA) 250 mg/mL injection 250 mg   Follow up in 1 Week.

## 2016-04-09 ENCOUNTER — Encounter (HOSPITAL_COMMUNITY): Payer: Self-pay | Admitting: *Deleted

## 2016-04-09 ENCOUNTER — Encounter (HOSPITAL_COMMUNITY): Payer: Self-pay | Admitting: Family Medicine

## 2016-04-09 ENCOUNTER — Inpatient Hospital Stay (HOSPITAL_COMMUNITY)
Admission: AD | Admit: 2016-04-09 | Discharge: 2016-04-09 | Disposition: A | Discharge status: Home / Self Care | Payer: Medicaid Other | Source: Ambulatory Visit | Attending: Obstetrics and Gynecology | Admitting: Obstetrics and Gynecology

## 2016-04-09 DIAGNOSIS — O9989 Other specified diseases and conditions complicating pregnancy, childbirth and the puerperium: Secondary | ICD-10-CM | POA: Diagnosis not present

## 2016-04-09 DIAGNOSIS — O4703 False labor before 37 completed weeks of gestation, third trimester: Secondary | ICD-10-CM

## 2016-04-09 DIAGNOSIS — O26899 Other specified pregnancy related conditions, unspecified trimester: Secondary | ICD-10-CM

## 2016-04-09 DIAGNOSIS — Z3A34 34 weeks gestation of pregnancy: Secondary | ICD-10-CM | POA: Diagnosis not present

## 2016-04-09 DIAGNOSIS — R109 Unspecified abdominal pain: Secondary | ICD-10-CM

## 2016-04-09 LAB — WET PREP, GENITAL
CLUE CELLS WET PREP: NONE SEEN
SPERM: NONE SEEN
TRICH WET PREP: NONE SEEN
YEAST WET PREP: NONE SEEN

## 2016-04-09 LAB — URINALYSIS, ROUTINE W REFLEX MICROSCOPIC
Bilirubin Urine: NEGATIVE
Glucose, UA: NEGATIVE mg/dL
Hgb urine dipstick: NEGATIVE
Ketones, ur: NEGATIVE mg/dL
LEUKOCYTES UA: NEGATIVE
NITRITE: NEGATIVE
PH: 6 (ref 5.0–8.0)
Protein, ur: NEGATIVE mg/dL

## 2016-04-09 MED ORDER — TERBUTALINE SULFATE 1 MG/ML IJ SOLN
0.2500 mg | Freq: Once | INTRAMUSCULAR | Status: AC
  Administered 2016-04-09: 0.25 mg via SUBCUTANEOUS
  Filled 2016-04-09: qty 1

## 2016-04-09 MED ORDER — NIFEDIPINE ER OSMOTIC RELEASE 30 MG PO TB24: 30.0000 mg | ORAL_TABLET | Freq: Every day | ORAL | 0 refills | Status: DC

## 2016-04-09 MED ORDER — NIFEDIPINE 10 MG PO CAPS
20.0000 mg | ORAL_CAPSULE | Freq: Once | ORAL | Status: AC
  Administered 2016-04-09: 20 mg via ORAL
  Filled 2016-04-09: qty 2

## 2016-04-09 MED ORDER — BETAMETHASONE SOD PHOS & ACET 6 (3-3) MG/ML IJ SUSP
12.0000 mg | Freq: Once | INTRAMUSCULAR | Status: AC
  Administered 2016-04-09: 12 mg via INTRAMUSCULAR
  Filled 2016-04-09: qty 2

## 2016-04-09 MED ORDER — LACTATED RINGERS IV BOLUS (SEPSIS)
1000.0000 mL | Freq: Once | INTRAVENOUS | Status: AC
  Administered 2016-04-09: 1000 mL via INTRAVENOUS

## 2016-04-09 NOTE — MAU Provider Note (Signed)
Chief Complaint:  Abdominal Pain and Vaginal Pain   First Provider Initiated Contact with Patient 04/08/16 2218      HPI: Brittany [name] is a 35 y.o. [phone] with IUP at 7619w5d, and pregnancy complicated by h/o PTD (receving 17-P), and h/o PIH, and PTD x 1 @ 36.6wks who presents to maternity admissions reporting pelvic pain radiating to her vagina, as well has some stomach discomfort, nausea, and a loose BM today. No vomiting. No fever, chills, dysuria, hematuria, or urinary frequency. Reports sharp pain and pressure in her vaginal and lower abdomen. Denies leakage of fluid or vaginal bleeding. Good fetal movement.   Pregnancy Course:  PNC at [facility] - H/o PTD at 8236 weeks - H/o PIH, received Mag postpartum - H/o Migraines  Past Medical History: Past Medical History:  Diagnosis Date  . Gallstone   . Headache(784.0)   . Hypertension   . Pregnancy induced hypertension   . Vaginal Pap smear, abnormal     Past obstetric history: OB History  Gravida Para Term Preterm AB Living  8 2 1 1 5 2   SAB TAB Ectopic Multiple Live Births  4 1 0 0 2    # Outcome Date GA Lbr Len/2nd Weight Sex Delivery Anes PTL Lv  8 Current           7 Preterm 04/06/11 3641w6d 08:25 / 00:08 5 lb 6.6 oz (2.455 kg) F Vag-Spont None  LIV  6 TAB           5 SAB           4 SAB           3 SAB           2 SAB           1 Term  7644w0d    Vag-Spont         Past Surgical History: Past Surgical History:  Procedure Laterality Date  . LEEP       Family History: Family History  Problem Relation Age of Onset  . Asthma Mother   . Hypertension Mother   . Asthma Maternal Grandmother     Social History: Social History  Substance Use Topics  . Smoking status: Never Smoker  . Smokeless tobacco: Never Used  . Alcohol use No    Allergies: No Known Allergies  Meds:  Prescriptions Prior to Admission  Medication Sig Dispense Refill Last Dose  . acetaminophen (TYLENOL) 500 MG tablet Take 1,000 mg by mouth  every 6 (six) hours as needed for mild pain or headache.    Past Week at Unknown time  . butalbital-acetaminophen-caffeine (FIORICET, ESGIC) 50-325-40 MG tablet Take 1-2 tablets by mouth every 6 (six) hours as needed for headache. 20 tablet 0 Past Week at Unknown time  . metoCLOPramide (REGLAN) 10 MG tablet Take 1 tablet (10 mg total) by mouth every 6 (six) hours as needed for nausea. 30 tablet 0 04/08/2016 at Unknown time  . Prenatal Vit-Fe Fumarate-FA (PRENATAL MULTIVITAMIN) TABS tablet Take 1 tablet by mouth daily.    04/08/2016 at Unknown time    I have reviewed patient's Past Medical Hx, Surgical Hx, Family Hx, Social Hx, medications and allergies.   ROS:  A comprehensive ROS was negative except per HPI.    Physical Exam   Patient Vitals for the past 24 hrs:  BP Temp Pulse Resp Height Weight  04/08/16 2136 128/83 98.2 F (36.8 C) 88 18 5\' 5"  (1.651 m) 181 lb (82.1  kg)   Constitutional: Well-developed, well-nourished female in no acute distress.  Cardiovascular: normal rate Respiratory: normal effort GI: Abd soft, non-tender, gravid appropriate for gestational age. Pos BS x 4 MS: Extremities nontender, no edema, normal ROM Neurologic: Alert and oriented x 4.  SVE: FT/50%/-2  FHT:  Baseline 120 , moderate variability, accelerations present, no decelerations Contractions: q 10 mins   Labs: Results for orders placed or performed during the hospital encounter of 04/08/16 (from the past 24 hour(s))  Urinalysis, Routine w reflex microscopic (not at [facility])     Status: None   Collection Time: 04/08/16  9:45 PM  Result Value Ref Range   Color, Urine YELLOW YELLOW   APPearance CLEAR CLEAR   Specific Gravity, Urine 1.015 1.005 - 1.030   pH 6.0 5.0 - 8.0   Glucose, UA NEGATIVE NEGATIVE mg/dL   Hgb urine dipstick NEGATIVE NEGATIVE   Bilirubin Urine NEGATIVE NEGATIVE   Ketones, ur NEGATIVE NEGATIVE mg/dL   Protein, ur NEGATIVE NEGATIVE mg/dL   Nitrite NEGATIVE NEGATIVE   Leukocytes,  UA NEGATIVE NEGATIVE   Imaging:  No results found.  MAU Course: - Patient seen and evaluated. SVE FT/50%/-2, changed from closed and thick in the office this morning. Having CTX ~q10 min. Procardia 10 mg every 20 min x 3 given. Phenergan given for nausea. - Repeat SVE 1cm/50%/-2. Terbutaline 0.25 mg and 1L LR bolus given.  - SVE a third time w/ unchanged exam; pt feeling that ctx have diminished but not completely gone away  MDM: Plan of care reviewed with patient, including labs and tests ordered and medical treatment.   Assessment: 35 y.o. [id number] with IUP at [redacted]w[redacted]d  Preterm ctx  Plan: D/C home w/ PTL precautions Rx Procardia XL 30 qd Msg sent to [facility] for appt early next week  Frederik Pear, MD 04/09/2016 12:18 AM   CNM attestation:  I have seen and examined this patient; I agree with above documentation in the resident's note.   Brittany [name] is a 35 y.o. [phone] reporting low abd cramping +FM, denies LOF, VB, vaginal discharge.  PE: BP 122/74   Pulse 80   Temp 98.2 F (36.8 C)   Resp 18   Ht 5\' 5"  (1.651 m)   Wt 82.1 kg (181 lb)   LMP 08/17/2015   BMI 30.12 kg/m  Gen: calm comfortable, NAD Resp: normal effort, no distress Abd: gravid  ROS, labs, PMH reviewed NST reactive   Plan:  - preterm labor precautions - rx Procardia XL 30 qd -  follow up in [facility] on Mon/Tues next week  Cam Hai, CNM 1:27 AM  04/09/2016

## 2016-04-09 NOTE — MAU Note (Signed)
Same symptoms as yesterday.  Feeling pelvic pressure and sharp pains in vagina.  Can barely walk. No bleeding or leaking

## 2016-04-09 NOTE — Discharge Instructions (Signed)
Preterm Labor Information °Preterm labor is when labor starts before you are [redacted] weeks pregnant. The normal length of pregnancy is 39 to 41 weeks.  °CAUSES  °The cause of preterm labor is not often known. The most common known cause is infection. °RISK FACTORS °· Having a history of preterm labor. °· Having your water break before it should. °· Having a placenta that covers the opening of the cervix. °· Having a placenta that breaks away from the uterus. °· Having a cervix that is too weak to hold the baby in the uterus. °· Having too much fluid in the amniotic sac. °· Taking drugs or smoking while pregnant. °· Not gaining enough weight while pregnant. °· Being younger than 18 and older than 33 years old. °· Having a low income. °· Being African American. °SYMPTOMS °· Period-like cramps, belly (abdominal) pain, or back pain. °· Contractions that are regular, as often as six in an hour. They may be mild or painful. °· Contractions that start at the top of the belly. They then move to the lower belly and back. °· Lower belly pressure that seems to get stronger. °· Bleeding from the vagina. °· Fluid leaking from the vagina. °TREATMENT  °Treatment depends on: °· Your condition. °· The condition of your baby. °· How many weeks pregnant you are. °Your doctor may have you: °· Take medicine to stop contractions. °· Stay in bed except to use the restroom (bed rest). °· Stay in the hospital. °WHAT SHOULD YOU DO IF YOU THINK YOU ARE IN PRETERM LABOR? °Call your doctor right away. You need to go to the hospital right away.  °HOW CAN YOU PREVENT PRETERM LABOR IN FUTURE PREGNANCIES? °· Stop smoking, if you smoke. °· Maintain healthy weight gain. °· Do not take drugs or be around chemicals that are not needed. °· Tell your doctor if you think you have an infection. °· Tell your doctor if you had a preterm labor before. °  °This information is not intended to replace advice given to you by your health care provider. Make sure you  discuss any questions you have with your health care provider. °  °Document Released: 09/30/2008 Document Revised: 11/18/2014 Document Reviewed: 08/06/2012 °Elsevier Interactive Patient Education ©2016 [employer] ° °

## 2016-04-09 NOTE — Discharge Instructions (Signed)

## 2016-04-09 NOTE — MAU Provider Note (Signed)
Chief Complaint:  No chief complaint on file.   First Provider Initiated Contact with Patient 04/09/16 1656     HPI  Brittany [name] is a 35 y.o. [phone] with IUP at [redacted]w[redacted]d, and pregnancy complicated by h/o PTD (receving 17-P), and h/o PIH, and PTD x 1 @ 36.6wks who presents to maternity admissions reporting pelvic pain radiating to her vagina, as well has some stomach discomfort, nausea, and a loose BM today. She was seen here for this last night   Cervix changed from FT/50% to 1/50%   And she was treated with Procardia and Terbutaline.  No vomiting. No fever, chills, dysuria, hematuria, or urinary frequency. Reports sharp pain and pressure in her vaginal and lower abdomen. Denies leakage of fluid or vaginal bleeding. Good fetal movement.   Pregnancy Course:  PNC at [facility] - H/o PTD at 31 weeks - H/o PIH, received Mag postpartum - H/o Migraines   Past Medical History: Past Medical History:  Diagnosis Date  . Gallstone   . Headache(784.0)   . Hypertension   . Pregnancy induced hypertension    after 2nd delivery  . Vaginal Pap smear, abnormal     Past obstetric history: OB History  Gravida Para Term Preterm AB Living  8 2 1 1 5 2   SAB TAB Ectopic Multiple Live Births  4 1 0 0 2    # Outcome Date GA Lbr Len/2nd Weight Sex Delivery Anes PTL Lv  8 Current           7 Preterm 04/06/11 [redacted]w[redacted]d 08:25 / 00:08 5 lb 6.6 oz (2.455 kg) F Vag-Spont None  LIV  6 TAB           5 SAB           4 SAB           3 SAB           2 SAB           1 Term  [redacted]w[redacted]d    Vag-Spont         Past Surgical History: Past Surgical History:  Procedure Laterality Date  . DILATION AND CURETTAGE OF UTERUS    . LEEP      Family History: Family History  Problem Relation Age of Onset  . Asthma Mother   . Hypertension Mother   . Asthma Maternal Grandmother     Social History: Social History  Substance Use Topics  . Smoking status: Never Smoker  . Smokeless tobacco: Never Used  . Alcohol use No     Allergies: No Known Allergies  Meds:  Prescriptions Prior to Admission  Medication Sig Dispense Refill Last Dose  . acetaminophen (TYLENOL) 500 MG tablet Take 1,000 mg by mouth every 6 (six) hours as needed for mild pain, moderate pain or headache.    Past Week at Unknown time  . butalbital-acetaminophen-caffeine (FIORICET, ESGIC) 50-325-40 MG tablet Take 1-2 tablets by mouth every 6 (six) hours as needed for headache. 20 tablet 0 Past Week at Unknown time  . metoCLOPramide (REGLAN) 10 MG tablet Take 1 tablet (10 mg total) by mouth every 6 (six) hours as needed for nausea. 30 tablet 0 04/08/2016 at Unknown time  . NIFEdipine (PROCARDIA XL) 30 MG 24 hr tablet Take 1 tablet (30 mg total) by mouth daily. 25 tablet 0 04/09/2016 at Unknown time  . Prenatal Vit-Fe Fumarate-FA (PRENATAL MULTIVITAMIN) TABS tablet Take 1 tablet by mouth daily.    04/09/2016 at Unknown time  I have reviewed patient's Past Medical Hx, Surgical Hx, Family Hx, Social Hx, medications and allergies.   ROS:  Review of Systems  Constitutional: Negative for chills and fever.  Gastrointestinal: Positive for abdominal pain. Negative for constipation, diarrhea and nausea.  Genitourinary: Positive for pelvic pain. Negative for dysuria, vaginal bleeding and vaginal discharge.  Neurological: Negative for dizziness.   Other systems negative  Physical Exam  Patient Vitals for the past 24 hrs:  BP Temp Temp src Pulse Resp Weight  04/09/16 1557 132/79 98.7 F (37.1 C) Oral 89 18 181 lb 4 oz (82.2 kg)   Constitutional: Well-developed, well-nourished female in no acute distress.  Cardiovascular: normal rate and rhythm Respiratory: normal effort, clear to auscultation bilaterally GI: Abd soft, non-tender, gravid appropriate for gestational age.   No rebound or guarding. MS: Extremities nontender, no edema, normal ROM Neurologic: Alert and oriented x 4.  GU: Neg CVAT.  Dilation: 2 Effacement (%): 60 Cervical Position:  Posterior Station: -3 Presentation: Vertex Exam by:: Leafy RoKatie Koontz, RNC  FHT:  Baseline 140 , moderate variability, accelerations present, no decelerations Contractions:  Irregular initially  Frequent irritability then she had contractions which were longer and stronger but less frequent.  Labs: O/Positive/-- (04/13 1417) Results for orders placed or performed during the hospital encounter of 04/09/16 (from the past 24 hour(s))  Urinalysis, Routine w reflex microscopic ([facility])     Status: Abnormal   Collection Time: 04/09/16  4:00 PM  Result Value Ref Range   Color, Urine YELLOW YELLOW   APPearance CLEAR CLEAR   Specific Gravity, Urine <1.005 (L) 1.005 - 1.030   pH 6.0 5.0 - 8.0   Glucose, UA NEGATIVE NEGATIVE mg/dL   Hgb urine dipstick NEGATIVE NEGATIVE   Bilirubin Urine NEGATIVE NEGATIVE   Ketones, ur NEGATIVE NEGATIVE mg/dL   Protein, ur NEGATIVE NEGATIVE mg/dL   Nitrite NEGATIVE NEGATIVE   Leukocytes, UA NEGATIVE NEGATIVE    Imaging:  No results found.  MAU Course/MDM: I have ordered labs and reviewed results.  NST reviewed Terbutaline given without much relief.  UC traced less frequently , but patient states they hurt the same.  THen I gave Procardia 20mg    Assessment: SIUP at 3557w5d Preterm uterine contractions Small amount of cervical change  Plan: Report given to Dr Omer JackMumaw.  Wynelle BourgeoisMarie Peder Allums CNM, MSN Certified Nurse-Midwife 04/09/2016 5:06 PM

## 2016-04-10 ENCOUNTER — Encounter (HOSPITAL_COMMUNITY): Payer: Self-pay | Admitting: Certified Nurse Midwife

## 2016-04-10 ENCOUNTER — Inpatient Hospital Stay (HOSPITAL_COMMUNITY)
Admission: AD | Admit: 2016-04-10 | Discharge: 2016-04-10 | Disposition: A | Discharge status: Home / Self Care | Payer: Medicaid Other | Source: Ambulatory Visit | Attending: Obstetrics & Gynecology | Admitting: Obstetrics & Gynecology

## 2016-04-10 DIAGNOSIS — Z3A33 33 weeks gestation of pregnancy: Secondary | ICD-10-CM | POA: Insufficient documentation

## 2016-04-10 DIAGNOSIS — O4703 False labor before 37 completed weeks of gestation, third trimester: Secondary | ICD-10-CM

## 2016-04-10 DIAGNOSIS — N858 Other specified noninflammatory disorders of uterus: Secondary | ICD-10-CM | POA: Insufficient documentation

## 2016-04-10 MED ORDER — BETAMETHASONE SOD PHOS & ACET 6 (3-3) MG/ML IJ SUSP
12.0000 mg | Freq: Once | INTRAMUSCULAR | Status: AC
  Administered 2016-04-10: 12 mg via INTRAMUSCULAR
  Filled 2016-04-10: qty 2

## 2016-04-10 NOTE — MAU Note (Signed)
Pt returns today for 2nd BMZ and recheck of SVE. Pt states she is still having ctxs at work. Pt denies vaginal bleeding or LOF. Fetus is active.

## 2016-04-10 NOTE — Discharge Instructions (Signed)

## 2016-04-10 NOTE — MAU Provider Note (Signed)
History   [name] is a 35 yo @ 33.6 wks [id number] who presents for her second dose of  Betamethasone. Denies any ctx's,LOF, or VB.  CSN: [id number]  Arrival date & time 04/10/16  1812   None     Chief Complaint  Patient presents with  . Contractions    HPI  Past Medical History:  Diagnosis Date  . Gallstone   . Headache(784.0)   . Hypertension   . Pregnancy induced hypertension    after 2nd delivery  . Vaginal Pap smear, abnormal     Past Surgical History:  Procedure Laterality Date  . DILATION AND CURETTAGE OF UTERUS    . LEEP      Family History  Problem Relation Age of Onset  . Asthma Mother   . Hypertension Mother   . Asthma Maternal Grandmother     Social History  Substance Use Topics  . Smoking status: Never Smoker  . Smokeless tobacco: Never Used  . Alcohol use No    OB History    Gravida Para Term Preterm AB Living   8 2 1 1 5 2    SAB TAB Ectopic Multiple Live Births   4 1 0 0 2      Review of Systems  Constitutional: Negative.   HENT: Negative.   Eyes: Negative.   Respiratory: Negative.   Cardiovascular: Negative.   Gastrointestinal: Negative.   Endocrine: Negative.   Genitourinary: Negative.   Musculoskeletal: Negative.   Skin: Negative.   Allergic/Immunologic: Negative.   Neurological: Negative.   Hematological: Negative.   Psychiatric/Behavioral: Negative.     Allergies  Review of patient's allergies indicates no known allergies.  Home Medications    BP 137/85 (BP Location: Left Arm)   Pulse 95   Temp 98.3 F (36.8 C) (Oral)   Resp 18   LMP 08/17/2015   Physical Exam  Constitutional: She is oriented to person, place, and time. She appears well-developed.  HENT:  Head: Normocephalic.  Eyes: Conjunctivae are normal. Pupils are equal, round, and reactive to light.  Neck: Normal range of motion.  Cardiovascular: Normal rate.   Pulmonary/Chest: Effort normal and breath sounds normal.  Abdominal: Soft. Bowel sounds are  normal.  Genitourinary: Vagina normal and uterus normal.  Musculoskeletal: Normal range of motion.  Neurological: She is alert and oriented to person, place, and time.  Skin: Skin is warm and dry.  Psychiatric: She has a normal mood and affect. Her behavior is normal. Judgment and thought content normal.    MAU Course  Procedures (including critical care time)  Labs Reviewed - No data to display No results found.   No diagnosis found.    MDM  1. Betamethsaone 2nd dose 2. PTL precautions   Plan: PTL s/s discussed, NST reactive, SVE dimple/0/-3; discussed adequate fluid intake, f/u in clinic

## 2016-04-15 ENCOUNTER — Ambulatory Visit: Payer: Self-pay

## 2016-04-15 ENCOUNTER — Encounter (HOSPITAL_COMMUNITY): Payer: Self-pay | Admitting: *Deleted

## 2016-04-15 ENCOUNTER — Ambulatory Visit (INDEPENDENT_AMBULATORY_CARE_PROVIDER_SITE_OTHER): Payer: Medicaid Other | Admitting: Obstetrics

## 2016-04-15 ENCOUNTER — Inpatient Hospital Stay (HOSPITAL_COMMUNITY)
Admission: AD | Admit: 2016-04-15 | Discharge: 2016-04-15 | Disposition: A | Discharge status: Home / Self Care | Payer: Medicaid Other | Source: Ambulatory Visit | Attending: Obstetrics and Gynecology | Admitting: Obstetrics and Gynecology

## 2016-04-15 VITALS — BP 143/96 | HR 87

## 2016-04-15 DIAGNOSIS — O26893 Other specified pregnancy related conditions, third trimester: Secondary | ICD-10-CM | POA: Insufficient documentation

## 2016-04-15 DIAGNOSIS — O09213 Supervision of pregnancy with history of pre-term labor, third trimester: Secondary | ICD-10-CM | POA: Diagnosis not present

## 2016-04-15 DIAGNOSIS — O133 Gestational [pregnancy-induced] hypertension without significant proteinuria, third trimester: Secondary | ICD-10-CM | POA: Diagnosis not present

## 2016-04-15 DIAGNOSIS — R030 Elevated blood-pressure reading, without diagnosis of hypertension: Secondary | ICD-10-CM | POA: Insufficient documentation

## 2016-04-15 DIAGNOSIS — Z3493 Encounter for supervision of normal pregnancy, unspecified, third trimester: Secondary | ICD-10-CM

## 2016-04-15 DIAGNOSIS — O0993 Supervision of high risk pregnancy, unspecified, third trimester: Secondary | ICD-10-CM | POA: Diagnosis not present

## 2016-04-15 DIAGNOSIS — R0989 Other specified symptoms and signs involving the circulatory and respiratory systems: Secondary | ICD-10-CM

## 2016-04-15 DIAGNOSIS — Z3A34 34 weeks gestation of pregnancy: Secondary | ICD-10-CM | POA: Insufficient documentation

## 2016-04-15 LAB — CBC
HEMATOCRIT: 33.4 % — AB (ref 36.0–46.0)
Hemoglobin: 11.4 g/dL — ABNORMAL LOW (ref 12.0–15.0)
MCH: 28.2 pg (ref 26.0–34.0)
MCHC: 34.1 g/dL (ref 30.0–36.0)
MCV: 82.7 fL (ref 78.0–100.0)
PLATELETS: 265 10*3/uL (ref 150–400)
RBC: 4.04 MIL/uL (ref 3.87–5.11)
RDW: 14.3 % (ref 11.5–15.5)
WBC: 15.2 10*3/uL — ABNORMAL HIGH (ref 4.0–10.5)

## 2016-04-15 LAB — URINALYSIS, ROUTINE W REFLEX MICROSCOPIC
BILIRUBIN URINE: NEGATIVE
GLUCOSE, UA: NEGATIVE mg/dL
HGB URINE DIPSTICK: NEGATIVE
Ketones, ur: NEGATIVE mg/dL
Leukocytes, UA: NEGATIVE
Nitrite: NEGATIVE
PH: 7 (ref 5.0–8.0)
Protein, ur: NEGATIVE mg/dL
SPECIFIC GRAVITY, URINE: 1.01 (ref 1.005–1.030)

## 2016-04-15 LAB — COMPREHENSIVE METABOLIC PANEL
ALT: 21 U/L (ref 14–54)
AST: 19 U/L (ref 15–41)
Albumin: 3 g/dL — ABNORMAL LOW (ref 3.5–5.0)
Alkaline Phosphatase: 94 U/L (ref 38–126)
Anion gap: 7 (ref 5–15)
BILIRUBIN TOTAL: 0.4 mg/dL (ref 0.3–1.2)
BUN: 10 mg/dL (ref 6–20)
CHLORIDE: 104 mmol/L (ref 101–111)
CO2: 22 mmol/L (ref 22–32)
CREATININE: 0.54 mg/dL (ref 0.44–1.00)
Calcium: 8.7 mg/dL — ABNORMAL LOW (ref 8.9–10.3)
Glucose, Bld: 80 mg/dL (ref 65–99)
POTASSIUM: 3.7 mmol/L (ref 3.5–5.1)
Sodium: 133 mmol/L — ABNORMAL LOW (ref 135–145)
TOTAL PROTEIN: 7.1 g/dL (ref 6.5–8.1)

## 2016-04-15 LAB — PROTEIN / CREATININE RATIO, URINE
CREATININE, URINE: 61 mg/dL
Protein Creatinine Ratio: 0.18 mg/mg{Cre} — ABNORMAL HIGH (ref 0.00–0.15)
TOTAL PROTEIN, URINE: 11 mg/dL

## 2016-04-15 MED ORDER — ACETAMINOPHEN 500 MG PO TABS
1000.0000 mg | ORAL_TABLET | Freq: Once | ORAL | Status: AC
  Administered 2016-04-15: 1000 mg via ORAL
  Filled 2016-04-15: qty 2

## 2016-04-15 MED ORDER — HYDROXYPROGESTERONE CAPROATE 250 MG/ML IM OIL
250.0000 mg | TOPICAL_OIL | Freq: Once | INTRAMUSCULAR | Status: DC
  Administered 2016-04-15: 250 mg via INTRAMUSCULAR

## 2016-04-15 NOTE — Progress Notes (Signed)
17-P given  

## 2016-04-15 NOTE — MAU Note (Signed)
Patient was seen today in the office following up after patient was in MAU over the weekend for contractions.  Her BP in the office was in the 140s/80 per patient.  Also reports feeling sharp vaginal pains.

## 2016-04-15 NOTE — MAU Provider Note (Signed)
History     CSN: [id number]  Arrival date and time: 04/15/16 1629   None     Chief Complaint  Patient presents with  . Hypertension  . Vaginal Pain   HPI Brittany [name] is a 35yo [id number] @ 34.4wks who presents from the office for further eval of an elevated BP in the office 140/80, per pt. She reports a mild H/A now, denies visual disturbances or RUQ pain. No ctx, leaking, or bldg. Her preg has been followed by the [facility] office and has been remarkable for 1) hx PIH after delivery 2) hx PTD  OB History    Gravida Para Term Preterm AB Living   8 2 1 1 5 2    SAB TAB Ectopic Multiple Live Births   4 1 0 0 2      Past Medical History:  Diagnosis Date  . Gallstone   . Headache(784.0)   . Hypertension   . Pregnancy induced hypertension    after 2nd delivery  . Vaginal Pap smear, abnormal     Past Surgical History:  Procedure Laterality Date  . DILATION AND CURETTAGE OF UTERUS    . LEEP      Family History  Problem Relation Age of Onset  . Asthma Mother   . Hypertension Mother   . Asthma Maternal Grandmother     Social History  Substance Use Topics  . Smoking status: Never Smoker  . Smokeless tobacco: Never Used  . Alcohol use No    Allergies: No Known Allergies  Prescriptions Prior to Admission  Medication Sig Dispense Refill Last Dose  . acetaminophen (TYLENOL) 500 MG tablet Take 1,000 mg by mouth every 6 (six) hours as needed for mild pain, moderate pain or headache.    Past Week at Unknown time  . butalbital-acetaminophen-caffeine (FIORICET, ESGIC) 50-325-40 MG tablet Take 1-2 tablets by mouth every 6 (six) hours as needed for headache. 20 tablet 0 Past Week at Unknown time  . metoCLOPramide (REGLAN) 10 MG tablet Take 1 tablet (10 mg total) by mouth every 6 (six) hours as needed for nausea. 30 tablet 0 04/08/2016 at Unknown time  . NIFEdipine (PROCARDIA XL) 30 MG 24 hr tablet Take 1 tablet (30 mg total) by mouth daily. 25 tablet 0 04/09/2016 at Unknown time  .  Prenatal Vit-Fe Fumarate-FA (PRENATAL MULTIVITAMIN) TABS tablet Take 1 tablet by mouth daily.    04/09/2016 at Unknown time    ROS No other pertinents other than what is listed in HPI Physical Exam   Blood pressure 125/87, pulse 80, temperature 98.7 F (37.1 C), temperature source Oral, resp. rate 16, last menstrual period 08/17/2015, SpO2 93 %.  Physical Exam  Constitutional: She is oriented to person, place, and time. She appears well-developed.  HENT:  Head: Normocephalic.  Neck: Normal range of motion.  Cardiovascular: Normal rate.   Respiratory: Effort normal.  GI:  EFM 130s, +accels, no decels Irreg ctx  Genitourinary:  Genitourinary Comments: Cx post/1/50/-2  Musculoskeletal: Normal range of motion.  Neurological: She is alert and oriented to person, place, and time.  Skin: Skin is warm and dry.  Psychiatric: She has a normal mood and affect. Her behavior is normal. Thought content normal.   CBC    Component Value Date/Time   WBC 15.2 (H) 04/15/2016 1809   RBC 4.04 04/15/2016 1809   HGB 11.4 (L) 04/15/2016 1809   HCT 33.4 (L) 04/15/2016 1809   HCT 33.6 (L) 03/02/2016 1100   PLT 265 04/15/2016 1809  PLT 238 03/02/2016 1100   MCV 82.7 04/15/2016 1809   MCV 85 03/02/2016 1100   MCH 28.2 04/15/2016 1809   MCHC 34.1 04/15/2016 1809   RDW 14.3 04/15/2016 1809   RDW 13.6 03/02/2016 1100   LYMPHSABS 2.3 10/29/2015 1417   MONOABS 0.8 10/25/2015 2000   EOSABS 0.1 10/29/2015 1417   BASOSABS 0.0 10/29/2015 1417   LFTs nl; cr 0.54  Urine P/C ratio: 0.18  MAU Course  Procedures  MDM CBC/CMET/urine P/C ratio ordered Tylenol ES given- feels H/A is improving  Assessment and Plan  IUP@34 .4wks Labile BPs/watch for gHTN  D/C home with pre-e precautions [facility]  Brittany [name], Brittany [name] CNM 04/15/2016, 6:31 PM

## 2016-04-15 NOTE — Discharge Instructions (Signed)

## 2016-04-20 ENCOUNTER — Ambulatory Visit: Payer: Medicaid Other | Admitting: "Endocrinology

## 2016-04-20 NOTE — Progress Notes (Signed)
Subjective:    Mayer CamelStacy Lerika Hammett is a 35 y.o. female being seen today for her obstetrical visit. She is at 5474w2d gestation. Patient reports no complaints. Fetal movement: normal.  Problem List Items Addressed This Visit    None    Visit Diagnoses   None.    Patient Active Problem List   Diagnosis Date Noted  . Migraine 03/18/2016  . Subclinical hyperthyroidism 12/09/2015  . Supervision of high risk pregnancy, antepartum 10/29/2015  . Pregnancy induced hypertension, delivered, current hospitalization 04/09/2011   Objective:    BP (!) 143/96   Pulse 87   LMP 08/17/2015  FHT:  150 BPM  Uterine Size: size equals dates  Presentation: unsure     Assessment:    Pregnancy @ 6774w2d weeks   Plan:     labs reviewed, problem list updated Consent signed. GBS sent TDAP offered  Rhogam given for RH negative Pediatrician: discussed. Infant feeding: plans to breastfeed. Maternity leave: discussed. Cigarette smoking: never smoked. No orders of the defined types were placed in this encounter.  Meds ordered this encounter  Medications  . DISCONTD: hydroxyprogesterone caproate (MAKENA) 250 mg/mL injection 250 mg   Follow up in 1 Week.   Patient ID: Mayer CamelStacy Lerika Ledger, female   DOB: [birth year], 35 y.o.   MRN: [id number]

## 2016-04-22 ENCOUNTER — Ambulatory Visit (INDEPENDENT_AMBULATORY_CARE_PROVIDER_SITE_OTHER): Payer: Medicaid Other | Admitting: Obstetrics

## 2016-04-22 ENCOUNTER — Ambulatory Visit: Payer: Self-pay

## 2016-04-22 DIAGNOSIS — Z23 Encounter for immunization: Secondary | ICD-10-CM | POA: Diagnosis not present

## 2016-04-22 DIAGNOSIS — O0990 Supervision of high risk pregnancy, unspecified, unspecified trimester: Secondary | ICD-10-CM

## 2016-04-22 DIAGNOSIS — O09213 Supervision of pregnancy with history of pre-term labor, third trimester: Secondary | ICD-10-CM

## 2016-04-22 MED ORDER — NIFEDIPINE ER OSMOTIC RELEASE 30 MG PO TB24: 30.0000 mg | ORAL_TABLET | Freq: Every day | ORAL | 4 refills | Status: DC

## 2016-04-22 MED ORDER — HYDROXYPROGESTERONE CAPROATE 250 MG/ML IM OIL
250.0000 mg | TOPICAL_OIL | Freq: Once | INTRAMUSCULAR | Status: AC
  Administered 2016-04-22: 250 mg via INTRAMUSCULAR

## 2016-04-25 ENCOUNTER — Inpatient Hospital Stay (HOSPITAL_COMMUNITY)
Admission: AD | Admit: 2016-04-25 | Discharge: 2016-04-25 | Disposition: A | Discharge status: Home / Self Care | Payer: Medicaid Other | Source: Ambulatory Visit | Attending: Obstetrics & Gynecology | Admitting: Obstetrics & Gynecology

## 2016-04-25 ENCOUNTER — Encounter (HOSPITAL_COMMUNITY): Payer: Self-pay | Admitting: *Deleted

## 2016-04-25 ENCOUNTER — Encounter: Payer: Self-pay | Admitting: Obstetrics

## 2016-04-25 DIAGNOSIS — Z3A36 36 weeks gestation of pregnancy: Secondary | ICD-10-CM | POA: Insufficient documentation

## 2016-04-25 DIAGNOSIS — Z3483 Encounter for supervision of other normal pregnancy, third trimester: Secondary | ICD-10-CM | POA: Insufficient documentation

## 2016-04-25 NOTE — MAU Note (Signed)
Abdominal pain and pressure

## 2016-04-25 NOTE — Discharge Instructions (Signed)
Braxton Hicks Contractions °Contractions of the uterus can occur throughout pregnancy. Contractions are not always a sign that you are in labor.  °WHAT ARE BRAXTON HICKS CONTRACTIONS?  °Contractions that occur before labor are called Braxton Hicks contractions, or false labor. Toward the end of pregnancy (32-34 weeks), these contractions can develop more often and may become more forceful. This is not true labor because these contractions do not result in opening (dilatation) and thinning of the cervix. They are sometimes difficult to tell apart from true labor because these contractions can be forceful and people have different pain tolerances. You should not feel embarrassed if you go to the hospital with false labor. Sometimes, the only way to tell if you are in true labor is for your health care provider to look for changes in the cervix. °If there are no prenatal problems or other health problems associated with the pregnancy, it is completely safe to be sent home with false labor and await the onset of true labor. °HOW CAN YOU TELL THE DIFFERENCE BETWEEN TRUE AND FALSE LABOR? °False Labor °· The contractions of false labor are usually shorter and not as hard as those of true labor.   °· The contractions are usually irregular.   °· The contractions are often felt in the front of the lower abdomen and in the groin.   °· The contractions may go away when you walk around or change positions while lying down.   °· The contractions get weaker and are shorter lasting as time goes on.   °· The contractions do not usually become progressively stronger, regular, and closer together as with true labor.   °True Labor °· Contractions in true labor last 30-70 seconds, become very regular, usually become more intense, and increase in frequency.   °· The contractions do not go away with walking.   °· The discomfort is usually felt in the top of the uterus and spreads to the lower abdomen and low back.   °· True labor can be  determined by your health care provider with an exam. This will show that the cervix is dilating and getting thinner.   °WHAT TO REMEMBER °· Keep up with your usual exercises and follow other instructions given by your health care provider.   °· Take medicines as directed by your health care provider.   °· Keep your regular prenatal appointments.   °· Eat and drink lightly if you think you are going into labor.   °· If Braxton Hicks contractions are making you uncomfortable:   °¨ Change your position from lying down or resting to walking, or from walking to resting.   °¨ Sit and rest in a tub of warm water.   °¨ Drink 2-3 glasses of water. Dehydration may cause these contractions.   °¨ Do slow and deep breathing several times an hour.   °WHEN SHOULD I SEEK IMMEDIATE MEDICAL CARE? °Seek immediate medical care if: °· Your contractions become stronger, more regular, and closer together.   °· You have fluid leaking or gushing from your vagina.   °· You have a fever.   °· You pass blood-tinged mucus.   °· You have vaginal bleeding.   °· You have continuous abdominal pain.   °· You have low back pain that you never had before.   °· You feel your baby's head pushing down and causing pelvic pressure.   °· Your baby is not moving as much as it used to.   °  °This information is not intended to replace advice given to you by your health care provider. Make sure you discuss any questions you have with your health care   provider. °  °Document Released: 07/04/2005 Document Revised: 07/09/2013 Document Reviewed: 04/15/2013 °Elsevier Interactive Patient Education ©2016 [employer] ° °

## 2016-04-25 NOTE — Progress Notes (Signed)
Subjective:    Brittany CamelStacy Lerika Raney is a 35 y.o. female being seen today for her obstetrical visit. She is at 3540w0d gestation. Patient reports backache and occasional contractions. Fetal movement: normal.  Problem List Items Addressed This Visit    Supervision of high risk pregnancy, antepartum   Relevant Medications   hydroxyprogesterone caproate (MAKENA) 250 mg/mL injection 250 mg (Completed)   Other Relevant Orders   Flu Vaccine QUAD 36+ mos IM (Fluarix, Quad PF) (Completed)    Other Visit Diagnoses   None.    Patient Active Problem List   Diagnosis Date Noted  . Migraine 03/18/2016  . Subclinical hyperthyroidism 12/09/2015  . Supervision of high risk pregnancy, antepartum 10/29/2015  . Pregnancy induced hypertension, delivered, current hospitalization 04/09/2011   Objective:    BP (!) 136/91   Pulse 83   LMP 08/17/2015  FHT:  150 BPM  Uterine Size: size equals dates  Presentation: unsure     Assessment:    Pregnancy @ 3340w0d weeks   Plan:     labs reviewed, problem list updated Consent signed. GBS sent TDAP offered  Rhogam given for RH negative Pediatrician: discussed. Infant feeding: plans to breastfeed. Maternity leave: discussed. Cigarette smoking: never smoked. Orders Placed This Encounter  Procedures  . Flu Vaccine QUAD 36+ mos IM (Fluarix, Quad PF)   Meds ordered this encounter  Medications  . hydroxyprogesterone caproate (MAKENA) 250 mg/mL injection 250 mg  . NIFEdipine (PROCARDIA XL) 30 MG 24 hr tablet    Sig: Take 1 tablet (30 mg total) by mouth daily.    Dispense:  30 tablet    Refill:  4   Follow up in 1 Week.   Patient ID: Brittany [name], female   DOB: 04/28/1981, 35 y.o.   MRN: [id number]

## 2016-04-27 ENCOUNTER — Ambulatory Visit (INDEPENDENT_AMBULATORY_CARE_PROVIDER_SITE_OTHER): Payer: Medicaid Other | Admitting: Certified Nurse Midwife

## 2016-04-27 ENCOUNTER — Other Ambulatory Visit (HOSPITAL_COMMUNITY)
Admission: RE | Admit: 2016-04-27 | Discharge: 2016-04-27 | Disposition: A | Discharge status: Home / Self Care | Payer: Medicaid Other | Source: Ambulatory Visit | Attending: Certified Nurse Midwife | Admitting: Certified Nurse Midwife

## 2016-04-27 VITALS — BP 134/90 | HR 89 | Temp 98.8°F | Wt 184.4 lb

## 2016-04-27 DIAGNOSIS — O0993 Supervision of high risk pregnancy, unspecified, third trimester: Secondary | ICD-10-CM

## 2016-04-27 DIAGNOSIS — N760 Acute vaginitis: Secondary | ICD-10-CM | POA: Diagnosis present

## 2016-04-27 DIAGNOSIS — O09293 Supervision of pregnancy with other poor reproductive or obstetric history, third trimester: Secondary | ICD-10-CM | POA: Diagnosis not present

## 2016-04-27 DIAGNOSIS — Z113 Encounter for screening for infections with a predominantly sexual mode of transmission: Secondary | ICD-10-CM | POA: Diagnosis present

## 2016-04-27 DIAGNOSIS — O0990 Supervision of high risk pregnancy, unspecified, unspecified trimester: Secondary | ICD-10-CM

## 2016-04-27 LAB — OB RESULTS CONSOLE GC/CHLAMYDIA: Gonorrhea: NEGATIVE

## 2016-04-27 NOTE — Progress Notes (Signed)
Patient c/o cramping and lower back pain

## 2016-04-27 NOTE — Addendum Note (Signed)
Addended by: Samantha CrimesENNEY, RACHELLE ANNE on: 04/27/2016 01:41 PM   Modules accepted: Orders

## 2016-04-27 NOTE — Addendum Note (Signed)
Addended by: Francene FindersJAMES, Tanaisha Pittman C on: 04/27/2016 01:18 PM   Modules accepted: Orders

## 2016-04-27 NOTE — Progress Notes (Addendum)
Subjective:    Brittany [name] is a 35 y.o. female being seen today for her obstetrical visit. She is at 4740w2d gestation. Patient reports backache, no bleeding, no leaking and occasional contractions. Fetal movement: normal.  Problem List Items Addressed This Visit      Other   Supervision of high risk pregnancy, antepartum    Other Visit Diagnoses    Hx of preeclampsia, prior pregnancy, currently pregnant, third trimester    -  Primary   Relevant Orders   Lactate dehydrogenase   Creatinine, serum   CBC   ALT   AST   Pathologist smear review   Protein / creatinine ratio, urine   Creatinine clearance, urine, 24 hour   Protein, urine, 24 hour   US MFM OB FOLLOW UP   Supervision of high risk pregnancy in third trimester       Relevant Orders   US MFM OB FOLLOW UP   Strep Gp B NAA   Cervicovaginal ancillary only     Patient Active Problem List   Diagnosis Date Noted  . Migraine 03/18/2016  . Subclinical hyperthyroidism 12/09/2015  . Supervision of high risk pregnancy, antepartum 10/29/2015  . Pregnancy induced hypertension, delivered, current hospitalization 04/09/2011   Objective:    BP 134/90   Pulse 89   Temp 98.8 F (37.1 C)   Wt 184 lb 6.4 oz (83.6 kg)   LMP 08/17/2015   BMI 30.69 kg/m  FHT:  130 BPM  Uterine Size: 36 cm and size equals dates  Presentation: cephalic   Cervix: 1 cm, thick, posterior.  Ballotable.   NST: + accels, no decels, moderate variability, Cat. 1 tracing. No contractions on toco.   Assessment:    Pregnancy @ 2240w2d weeks   Preeclampsia: on procardia  PIH work up done  reactive NST  Plan:    US @ MFM   labs reviewed, problem list updated Consent signed. GBS sent TDAP offered  Rhogam given for RH negative Pediatrician: discussed. Infant feeding: plans to breastfeed. Maternity leave: discussed. Cigarette smoking: never smoked. Orders Placed This Encounter  Procedures  . Strep Gp B NAA  . US MFM OB FOLLOW UP    Standing  Status:   Future    Standing Expiration Date:   06/27/2017    Order Specific Question:   Reason for Exam (SYMPTOM  OR DIAGNOSIS REQUIRED)    Answer:   f/u growth, mild preeclampsia    Order Specific Question:   Preferred imaging location?    Answer:   [facility]  . Lactate dehydrogenase  . Creatinine, serum  . CBC  . ALT  . AST  . Pathologist smear review  . Protein / creatinine ratio, urine  . Creatinine clearance, urine, 24 hour    Standing Status:   Future    Standing Expiration Date:   04/27/2017  . Protein, urine, 24 hour    Standing Status:   Future    Standing Expiration Date:   04/27/2017   No orders of the defined types were placed in this encounter.  Follow up in 1 Week with NST.

## 2016-04-28 LAB — CERVICOVAGINAL ANCILLARY ONLY
Chlamydia: NEGATIVE
Neisseria Gonorrhea: NEGATIVE
TRICH (WINDOWPATH): NEGATIVE

## 2016-04-28 LAB — LACTATE DEHYDROGENASE: LDH: 187 IU/L (ref 119–226)

## 2016-04-28 LAB — CBC
HEMATOCRIT: 33.3 % — AB (ref 34.0–46.6)
HEMOGLOBIN: 11.2 g/dL (ref 11.1–15.9)
MCH: 27.6 pg (ref 26.6–33.0)
MCHC: 33.6 g/dL (ref 31.5–35.7)
MCV: 82 fL (ref 79–97)
Platelets: 241 10*3/uL (ref 150–379)
RBC: 4.06 x10E6/uL (ref 3.77–5.28)
RDW: 13.7 % (ref 12.3–15.4)
WBC: 10.9 10*3/uL — AB (ref 3.4–10.8)

## 2016-04-28 LAB — CREATININE, SERUM
CREATININE: 0.62 mg/dL (ref 0.57–1.00)
GFR calc Af Amer: 137 mL/min/{1.73_m2} (ref >59)
GFR calc non Af Amer: 119 mL/min/{1.73_m2} (ref >59)

## 2016-04-28 LAB — AST: AST: 21 IU/L (ref 0–40)

## 2016-04-28 LAB — PROTEIN / CREATININE RATIO, URINE
CREATININE, UR: 13.7 mg/dL
PROTEIN UR: 14.5 mg/dL
PROTEIN/CREAT RATIO: 1058 mg/g{creat} — AB (ref 0–200)

## 2016-04-28 LAB — ALT: ALT: 16 IU/L (ref 0–32)

## 2016-04-29 ENCOUNTER — Ambulatory Visit: Payer: Self-pay

## 2016-04-29 ENCOUNTER — Other Ambulatory Visit: Payer: Self-pay | Admitting: Certified Nurse Midwife

## 2016-04-29 LAB — STREP GP B NAA: Strep Gp B NAA: POSITIVE — AB

## 2016-05-01 ENCOUNTER — Inpatient Hospital Stay (HOSPITAL_COMMUNITY)
Admission: AD | Admit: 2016-05-01 | Discharge: 2016-05-01 | Disposition: A | Discharge status: Home / Self Care | Payer: Medicaid Other | Source: Ambulatory Visit | Attending: Obstetrics and Gynecology | Admitting: Obstetrics and Gynecology

## 2016-05-01 ENCOUNTER — Encounter (HOSPITAL_COMMUNITY): Payer: Self-pay | Admitting: *Deleted

## 2016-05-01 DIAGNOSIS — R102 Pelvic and perineal pain: Secondary | ICD-10-CM | POA: Diagnosis present

## 2016-05-01 DIAGNOSIS — O26893 Other specified pregnancy related conditions, third trimester: Secondary | ICD-10-CM

## 2016-05-01 DIAGNOSIS — M549 Dorsalgia, unspecified: Secondary | ICD-10-CM | POA: Diagnosis not present

## 2016-05-01 DIAGNOSIS — O133 Gestational [pregnancy-induced] hypertension without significant proteinuria, third trimester: Secondary | ICD-10-CM | POA: Insufficient documentation

## 2016-05-01 DIAGNOSIS — O9989 Other specified diseases and conditions complicating pregnancy, childbirth and the puerperium: Secondary | ICD-10-CM

## 2016-05-01 DIAGNOSIS — Z3A36 36 weeks gestation of pregnancy: Secondary | ICD-10-CM | POA: Diagnosis not present

## 2016-05-01 DIAGNOSIS — N949 Unspecified condition associated with female genital organs and menstrual cycle: Secondary | ICD-10-CM

## 2016-05-01 DIAGNOSIS — O99891 Other specified diseases and conditions complicating pregnancy: Secondary | ICD-10-CM

## 2016-05-01 LAB — URINALYSIS, ROUTINE W REFLEX MICROSCOPIC
BILIRUBIN URINE: NEGATIVE
Glucose, UA: NEGATIVE mg/dL
HGB URINE DIPSTICK: NEGATIVE
KETONES UR: NEGATIVE mg/dL
Leukocytes, UA: NEGATIVE
NITRITE: NEGATIVE
Protein, ur: NEGATIVE mg/dL
Specific Gravity, Urine: 1.015 (ref 1.005–1.030)
pH: 7 (ref 5.0–8.0)

## 2016-05-01 LAB — CREATININE, SERUM
CREATININE: 0.48 mg/dL (ref 0.44–1.00)
GFR calc Af Amer: >60 mL/min (ref >60)

## 2016-05-01 LAB — PROTEIN, URINE, 24 HOUR
Collection Interval-UPROT: 24 hours
PROTEIN, 24H URINE: 95 mg/d (ref 50–100)
PROTEIN, URINE: 9 mg/dL
Urine Total Volume-UPROT: 1050 mL

## 2016-05-01 LAB — CREATININE CLEARANCE, URINE, 24 HOUR
COLLECTION INTERVAL-CRCL: 24 h
Creatinine Clearance: 129 mL/min — ABNORMAL HIGH (ref 75–115)
Creatinine, 24H Ur: 890 mg/d (ref 600–1800)
Creatinine, Urine: 84.8 mg/dL
URINE TOTAL VOLUME-CRCL: 1050 mL

## 2016-05-01 NOTE — MAU Note (Signed)
Cramping in lower abd and low back. feeling a lot of pressure when she pees.  Feels like something is going to fall out.   Just finished doing a 24 hour urine.  States has light headache, denies visual changes or epigastric pain

## 2016-05-01 NOTE — Discharge Instructions (Signed)

## 2016-05-01 NOTE — MAU Provider Note (Signed)
History   G*[id number]*[id number] @ 36.6 wks in with c/o pelvic pressure and back pain that has been going on for several days. Pt currently finished 24 hr urine for elevated BP. Pt denies headache, blurred vision, epigastric pain. Only pelvic pressure.  CSN: [id number]  Arrival date & time 05/01/16  1814   First Provider Initiated Contact with Patient 05/01/16 1856      Chief Complaint  Patient presents with  . Abdominal Cramping  . Back Pain    HPI  Past Medical History:  Diagnosis Date  . Gallstone   . Headache(784.0)   . Hypertension   . Pregnancy induced hypertension    after 2nd delivery  . Vaginal Pap smear, abnormal     Past Surgical History:  Procedure Laterality Date  . DILATION AND CURETTAGE OF UTERUS    . LEEP      Family History  Problem Relation Age of Onset  . Asthma Mother   . Hypertension Mother   . Asthma Maternal Grandmother     Social History  Substance Use Topics  . Smoking status: Never Smoker  . Smokeless tobacco: Never Used  . Alcohol use No    OB History    Gravida Para Term Preterm AB Living   8 2 1 1 5 2    SAB TAB Ectopic Multiple Live Births   4 1 0 0 2      Review of Systems  Constitutional: Negative.   HENT: Negative.   Eyes: Negative.   Respiratory: Negative.   Cardiovascular: Negative.   Gastrointestinal: Negative.   Endocrine: Negative.   Genitourinary: Positive for dysuria.  Musculoskeletal: Positive for back pain.  Skin: Negative.     Allergies  Review of patient's allergies indicates no known allergies.  Home Medications    BP 134/92 (BP Location: Right Arm)   Pulse 80   Temp 98.7 F (37.1 C) (Oral)   Resp 16   LMP 08/17/2015   Physical Exam  Constitutional: She is oriented to person, place, and time. She appears well-developed and well-nourished.  HENT:  Head: Normocephalic.  Eyes: Pupils are equal, round, and reactive to light.  Neck: Normal range of motion.  Cardiovascular: Normal rate, regular rhythm,  normal heart sounds and intact distal pulses.   Pulmonary/Chest: Effort normal and breath sounds normal.  Abdominal: Soft. Bowel sounds are normal.  Genitourinary: Vagina normal and uterus normal.  Musculoskeletal: Normal range of motion.  Neurological: She is alert and oriented to person, place, and time. She has normal reflexes.  Skin: Skin is warm and dry.  Psychiatric: She has a normal mood and affect. Her behavior is normal. Judgment and thought content normal.    MAU Course  Procedures (including critical care time)  Labs Reviewed  URINALYSIS, ROUTINE W REFLEX MICROSCOPIC (NOT AT [facility])   No results found.   No diagnosis found.    MDM  PIH Pelvic pressure in pregnancy Back pain in pregnancy  SVE ft/th/post/high. FHR 140's with accels noted. DTR's 1+ no clonus. Urinalysis  WNL. VSS. Will d/c home

## 2016-05-02 ENCOUNTER — Other Ambulatory Visit: Payer: Self-pay | Admitting: Certified Nurse Midwife

## 2016-05-02 LAB — CERVICOVAGINAL ANCILLARY ONLY
BACTERIAL VAGINITIS: NEGATIVE
Candida vaginitis: NEGATIVE

## 2016-05-03 ENCOUNTER — Encounter (HOSPITAL_COMMUNITY): Payer: Self-pay | Admitting: *Deleted

## 2016-05-03 ENCOUNTER — Other Ambulatory Visit: Payer: Self-pay | Admitting: Certified Nurse Midwife

## 2016-05-03 ENCOUNTER — Ambulatory Visit (INDEPENDENT_AMBULATORY_CARE_PROVIDER_SITE_OTHER): Payer: Medicaid Other | Admitting: Certified Nurse Midwife

## 2016-05-03 ENCOUNTER — Telehealth: Payer: Self-pay | Admitting: *Deleted

## 2016-05-03 ENCOUNTER — Encounter: Payer: Self-pay | Admitting: *Deleted

## 2016-05-03 ENCOUNTER — Inpatient Hospital Stay (HOSPITAL_COMMUNITY)
Admission: AD | Admit: 2016-05-03 | Discharge: 2016-05-06 | DRG: 775 | Disposition: A | Discharge status: Home / Self Care | Payer: Medicaid Other | Source: Ambulatory Visit | Attending: Obstetrics & Gynecology | Admitting: Obstetrics & Gynecology

## 2016-05-03 VITALS — BP 151/95 | HR 92 | Temp 98.4°F | Wt 180.8 lb

## 2016-05-03 DIAGNOSIS — Z8249 Family history of ischemic heart disease and other diseases of the circulatory system: Secondary | ICD-10-CM

## 2016-05-03 DIAGNOSIS — O1414 Severe pre-eclampsia complicating childbirth: Principal | ICD-10-CM | POA: Diagnosis present

## 2016-05-03 DIAGNOSIS — O99824 Streptococcus B carrier state complicating childbirth: Secondary | ICD-10-CM | POA: Diagnosis present

## 2016-05-03 DIAGNOSIS — O1493 Unspecified pre-eclampsia, third trimester: Secondary | ICD-10-CM

## 2016-05-03 DIAGNOSIS — Z3A37 37 weeks gestation of pregnancy: Secondary | ICD-10-CM | POA: Diagnosis not present

## 2016-05-03 DIAGNOSIS — O99284 Endocrine, nutritional and metabolic diseases complicating childbirth: Secondary | ICD-10-CM | POA: Diagnosis present

## 2016-05-03 DIAGNOSIS — Z825 Family history of asthma and other chronic lower respiratory diseases: Secondary | ICD-10-CM

## 2016-05-03 DIAGNOSIS — O139 Gestational [pregnancy-induced] hypertension without significant proteinuria, unspecified trimester: Secondary | ICD-10-CM | POA: Diagnosis present

## 2016-05-03 DIAGNOSIS — O0993 Supervision of high risk pregnancy, unspecified, third trimester: Secondary | ICD-10-CM

## 2016-05-03 DIAGNOSIS — R030 Elevated blood-pressure reading, without diagnosis of hypertension: Secondary | ICD-10-CM | POA: Diagnosis present

## 2016-05-03 DIAGNOSIS — O09293 Supervision of pregnancy with other poor reproductive or obstetric history, third trimester: Secondary | ICD-10-CM

## 2016-05-03 DIAGNOSIS — O134 Gestational [pregnancy-induced] hypertension without significant proteinuria, complicating childbirth: Secondary | ICD-10-CM | POA: Diagnosis not present

## 2016-05-03 DIAGNOSIS — O133 Gestational [pregnancy-induced] hypertension without significant proteinuria, third trimester: Secondary | ICD-10-CM

## 2016-05-03 LAB — COMPREHENSIVE METABOLIC PANEL
ALBUMIN: 3.1 g/dL — AB (ref 3.5–5.0)
ALK PHOS: 113 U/L (ref 38–126)
ALT: 19 U/L (ref 14–54)
AST: 23 U/L (ref 15–41)
Anion gap: 9 (ref 5–15)
BUN: 7 mg/dL (ref 6–20)
CALCIUM: 8.8 mg/dL — AB (ref 8.9–10.3)
CHLORIDE: 103 mmol/L (ref 101–111)
CO2: 22 mmol/L (ref 22–32)
Creatinine, Ser: 0.7 mg/dL (ref 0.44–1.00)
GFR calc non Af Amer: >60 mL/min (ref >60)
GLUCOSE: 73 mg/dL (ref 65–99)
Potassium: 3.3 mmol/L — ABNORMAL LOW (ref 3.5–5.1)
SODIUM: 134 mmol/L — AB (ref 135–145)
Total Bilirubin: 0.5 mg/dL (ref 0.3–1.2)
Total Protein: 7.5 g/dL (ref 6.5–8.1)

## 2016-05-03 LAB — URINALYSIS, ROUTINE W REFLEX MICROSCOPIC
BILIRUBIN URINE: NEGATIVE
Glucose, UA: NEGATIVE mg/dL
KETONES UR: 15 mg/dL — AB
NITRITE: NEGATIVE
Protein, ur: NEGATIVE mg/dL
Specific Gravity, Urine: 1.01 (ref 1.005–1.030)
pH: 6.5 (ref 5.0–8.0)

## 2016-05-03 LAB — CBC
HCT: 33.6 % — ABNORMAL LOW (ref 36.0–46.0)
HCT: 34.9 % — ABNORMAL LOW (ref 36.0–46.0)
HEMOGLOBIN: 11.4 g/dL — AB (ref 12.0–15.0)
Hemoglobin: 12 g/dL (ref 12.0–15.0)
MCH: 28.4 pg (ref 26.0–34.0)
MCH: 28.6 pg (ref 26.0–34.0)
MCHC: 33.9 g/dL (ref 30.0–36.0)
MCHC: 34.4 g/dL (ref 30.0–36.0)
MCV: 83.8 fL (ref 78.0–100.0)
MCV: 83.1 fL (ref 78.0–100.0)
PLATELETS: 242 10*3/uL (ref 150–400)
PLATELETS: 247 10*3/uL (ref 150–400)
RBC: 4.01 MIL/uL (ref 3.87–5.11)
RBC: 4.2 MIL/uL (ref 3.87–5.11)
RDW: 14.7 % (ref 11.5–15.5)
RDW: 14.6 % (ref 11.5–15.5)
WBC: 13.2 10*3/uL — AB (ref 4.0–10.5)
WBC: 15.7 10*3/uL — AB (ref 4.0–10.5)

## 2016-05-03 LAB — TYPE AND SCREEN
ABO/RH(D): O POS
ANTIBODY SCREEN: NEGATIVE

## 2016-05-03 LAB — PROTEIN / CREATININE RATIO, URINE
Creatinine, Urine: 43 mg/dL
Protein Creatinine Ratio: 0.19 mg/mg{Cre} — ABNORMAL HIGH (ref 0.00–0.15)
TOTAL PROTEIN, URINE: 8 mg/dL

## 2016-05-03 LAB — URINE MICROSCOPIC-ADD ON: RBC / HPF: NONE SEEN RBC/hpf (ref 0–5)

## 2016-05-03 MED ORDER — LACTATED RINGERS IV SOLN: 500.0000 mL | INTRAVENOUS | Status: DC | PRN

## 2016-05-03 MED ORDER — MAGNESIUM SULFATE 50 % IJ SOLN
2.0000 g/h | INTRAVENOUS | Status: AC
  Administered 2016-05-04: 2 g/h via INTRAVENOUS
  Filled 2016-05-03 (×2): qty 80

## 2016-05-03 MED ORDER — DIPHENHYDRAMINE HCL 25 MG PO CAPS: 25.0000 mg | ORAL_CAPSULE | Freq: Four times a day (QID) | ORAL | Status: DC | PRN

## 2016-05-03 MED ORDER — FLEET ENEMA 7-19 GM/118ML RE ENEM: 1.0000 | ENEMA | RECTAL | Status: DC | PRN

## 2016-05-03 MED ORDER — BUTALBITAL-APAP-CAFFEINE 50-325-40 MG PO TABS
1.0000 | ORAL_TABLET | Freq: Once | ORAL | Status: AC
  Administered 2016-05-03: 1 via ORAL
  Filled 2016-05-03: qty 1

## 2016-05-03 MED ORDER — SOD CITRATE-CITRIC ACID 500-334 MG/5ML PO SOLN: 30.0000 mL | ORAL | Status: DC | PRN

## 2016-05-03 MED ORDER — MAGNESIUM SULFATE BOLUS VIA INFUSION
6.0000 g | Freq: Once | INTRAVENOUS | Status: AC
  Administered 2016-05-03: 6 g via INTRAVENOUS
  Filled 2016-05-03: qty 500

## 2016-05-03 MED ORDER — BENZOCAINE-MENTHOL 20-0.5 % EX AERO
1.0000 "application " | INHALATION_SPRAY | CUTANEOUS | Status: DC | PRN
  Administered 2016-05-05: 1 via TOPICAL
  Filled 2016-05-03 (×2): qty 56

## 2016-05-03 MED ORDER — ACETAMINOPHEN 500 MG PO TABS
1000.0000 mg | ORAL_TABLET | Freq: Four times a day (QID) | ORAL | Status: DC | PRN
  Administered 2016-05-05: 1000 mg via ORAL
  Filled 2016-05-03: qty 2

## 2016-05-03 MED ORDER — OXYTOCIN 40 UNITS IN LACTATED RINGERS INFUSION - SIMPLE MED
2.5000 [IU]/h | INTRAVENOUS | Status: DC
  Administered 2016-05-03: 2.5 [IU]/h via INTRAVENOUS

## 2016-05-03 MED ORDER — LACTATED RINGERS IV BOLUS (SEPSIS)
1000.0000 mL | Freq: Once | INTRAVENOUS | Status: AC
  Administered 2016-05-03: 1000 mL via INTRAVENOUS

## 2016-05-03 MED ORDER — OXYCODONE HCL 5 MG PO TABS
10.0000 mg | ORAL_TABLET | ORAL | Status: DC | PRN
  Administered 2016-05-04 – 2016-05-06 (×4): 10 mg via ORAL
  Filled 2016-05-03 (×4): qty 2

## 2016-05-03 MED ORDER — PENICILLIN G POTASSIUM 5000000 UNITS IJ SOLR
5.0000 10*6.[IU] | Freq: Once | INTRAVENOUS | Status: AC
  Administered 2016-05-03: 5 10*6.[IU] via INTRAVENOUS
  Filled 2016-05-03: qty 5

## 2016-05-03 MED ORDER — OXYCODONE-ACETAMINOPHEN 5-325 MG PO TABS: 2.0000 | ORAL_TABLET | ORAL | Status: DC | PRN

## 2016-05-03 MED ORDER — TETANUS-DIPHTH-ACELL PERTUSSIS 5-2.5-18.5 LF-MCG/0.5 IM SUSP
0.5000 mL | Freq: Once | INTRAMUSCULAR | Status: DC
  Filled 2016-05-03: qty 0.5

## 2016-05-03 MED ORDER — ONDANSETRON HCL 4 MG PO TABS: 4.0000 mg | ORAL_TABLET | ORAL | Status: DC | PRN

## 2016-05-03 MED ORDER — DIBUCAINE 1 % RE OINT
1.0000 "application " | TOPICAL_OINTMENT | RECTAL | Status: DC | PRN
  Filled 2016-05-03: qty 28.4

## 2016-05-03 MED ORDER — OXYTOCIN BOLUS FROM INFUSION
500.0000 mL | Freq: Once | INTRAVENOUS | Status: AC
  Administered 2016-05-03: 500 mL via INTRAVENOUS

## 2016-05-03 MED ORDER — PRENATAL MULTIVITAMIN CH
1.0000 | ORAL_TABLET | Freq: Every day | ORAL | Status: DC
  Administered 2016-05-05: 1 via ORAL
  Filled 2016-05-03: qty 1

## 2016-05-03 MED ORDER — OXYTOCIN 40 UNITS IN LACTATED RINGERS INFUSION - SIMPLE MED
1.0000 m[IU]/min | INTRAVENOUS | Status: DC
  Administered 2016-05-03: 2 m[IU]/min via INTRAVENOUS
  Filled 2016-05-03: qty 1000

## 2016-05-03 MED ORDER — LACTATED RINGERS IV SOLN
2.0000 g/h | INTRAVENOUS | Status: DC
  Administered 2016-05-03: 2 g/h via INTRAVENOUS
  Filled 2016-05-03: qty 80

## 2016-05-03 MED ORDER — MEASLES, MUMPS & RUBELLA VAC ~~LOC~~ INJ
0.5000 mL | INJECTION | Freq: Once | SUBCUTANEOUS | Status: DC
  Filled 2016-05-03: qty 0.5

## 2016-05-03 MED ORDER — FENTANYL CITRATE (PF) 100 MCG/2ML IJ SOLN
INTRAMUSCULAR | Status: AC
  Administered 2016-05-03: 100 ug via INTRAVENOUS
  Filled 2016-05-03: qty 2

## 2016-05-03 MED ORDER — ONDANSETRON HCL 4 MG/2ML IJ SOLN: 4.0000 mg | Freq: Four times a day (QID) | INTRAMUSCULAR | Status: DC | PRN

## 2016-05-03 MED ORDER — HYDRALAZINE HCL 20 MG/ML IJ SOLN: 5.0000 mg | INTRAMUSCULAR | Status: DC | PRN

## 2016-05-03 MED ORDER — ONDANSETRON HCL 4 MG/2ML IJ SOLN: 4.0000 mg | INTRAMUSCULAR | Status: DC | PRN

## 2016-05-03 MED ORDER — LACTATED RINGERS IV SOLN: INTRAVENOUS | Status: DC

## 2016-05-03 MED ORDER — COCONUT OIL OIL
1.0000 "application " | TOPICAL_OIL | Status: DC | PRN
  Filled 2016-05-03: qty 120

## 2016-05-03 MED ORDER — ACETAMINOPHEN 325 MG PO TABS
650.0000 mg | ORAL_TABLET | ORAL | Status: DC | PRN
Start: 2016-05-03 — End: 2016-05-03
  Administered 2016-05-03: 650 mg via ORAL
  Filled 2016-05-03: qty 2

## 2016-05-03 MED ORDER — WITCH HAZEL-GLYCERIN EX PADS: 1.0000 "application " | MEDICATED_PAD | CUTANEOUS | Status: DC | PRN

## 2016-05-03 MED ORDER — LACTATED RINGERS IV SOLN
INTRAVENOUS | Status: DC
  Administered 2016-05-03: 17:00:00 via INTRAVENOUS

## 2016-05-03 MED ORDER — LIDOCAINE HCL (PF) 1 % IJ SOLN
30.0000 mL | INTRAMUSCULAR | Status: DC | PRN
  Filled 2016-05-03: qty 30

## 2016-05-03 MED ORDER — OXYCODONE HCL 5 MG PO TABS
5.0000 mg | ORAL_TABLET | ORAL | Status: DC | PRN
  Administered 2016-05-03 – 2016-05-04 (×4): 5 mg via ORAL
  Filled 2016-05-03 (×5): qty 1

## 2016-05-03 MED ORDER — FENTANYL CITRATE (PF) 100 MCG/2ML IJ SOLN
100.0000 ug | INTRAMUSCULAR | Status: DC | PRN
  Administered 2016-05-03 (×2): 100 ug via INTRAVENOUS
  Filled 2016-05-03 (×2): qty 2

## 2016-05-03 MED ORDER — LABETALOL HCL 5 MG/ML IV SOLN: 20.0000 mg | INTRAVENOUS | Status: DC | PRN

## 2016-05-03 MED ORDER — SIMETHICONE 80 MG PO CHEW
80.0000 mg | CHEWABLE_TABLET | ORAL | Status: DC | PRN
  Administered 2016-05-05: 80 mg via ORAL
  Filled 2016-05-03: qty 1

## 2016-05-03 MED ORDER — OXYCODONE-ACETAMINOPHEN 5-325 MG PO TABS: 1.0000 | ORAL_TABLET | ORAL | Status: DC | PRN

## 2016-05-03 MED ORDER — PENICILLIN G POTASSIUM 5000000 UNITS IJ SOLR
2.5000 10*6.[IU] | INTRAVENOUS | Status: DC
  Administered 2016-05-03: 2.5 10*6.[IU] via INTRAVENOUS
  Filled 2016-05-03 (×2): qty 2.5

## 2016-05-03 MED ORDER — ZOLPIDEM TARTRATE 5 MG PO TABS: 5.0000 mg | ORAL_TABLET | Freq: Every evening | ORAL | Status: DC | PRN

## 2016-05-03 MED ORDER — DOCUSATE SODIUM 100 MG PO CAPS
100.0000 mg | ORAL_CAPSULE | Freq: Two times a day (BID) | ORAL | Status: DC
  Administered 2016-05-04 – 2016-05-06 (×5): 100 mg via ORAL
  Filled 2016-05-03 (×5): qty 1

## 2016-05-03 MED ORDER — TERBUTALINE SULFATE 1 MG/ML IJ SOLN: 0.2500 mg | Freq: Once | INTRAMUSCULAR | Status: DC | PRN

## 2016-05-03 MED ORDER — SENNOSIDES-DOCUSATE SODIUM 8.6-50 MG PO TABS
2.0000 | ORAL_TABLET | ORAL | Status: DC
  Administered 2016-05-04 – 2016-05-05 (×3): 2 via ORAL
  Filled 2016-05-03 (×3): qty 2

## 2016-05-03 NOTE — Telephone Encounter (Signed)
Patient is calling with a headache that will not go away. She took Robitussin this morning and she doesn't feel well. Ask patient if she could come to the office and she replied- the only time she come today is at 3-4. Told patient to come then or to go to MAU if she could not make it to the hospital.

## 2016-05-03 NOTE — Progress Notes (Signed)
Subjective:    Mayer CamelStacy Lerika Osentoski is a 35 y.o. female being seen today for her obstetrical visit. She is at 2657w1d gestation. Patient reports headache, nausea, no leaking, occasional contractions and has an HA that started last night unreleved by Tylenol.  Elevated blood pressures in office: 151/95 sitting.  Denies uppergastric pain. . Fetal movement: decreased.  Problem List Items Addressed This Visit    None    Visit Diagnoses    High-risk pregnancy in third trimester    -  Primary   Relevant Orders   Fetal nonstress test     Patient Active Problem List   Diagnosis Date Noted  . Migraine 03/18/2016  . Subclinical hyperthyroidism 12/09/2015  . Supervision of high risk pregnancy, antepartum 10/29/2015  . Pregnancy induced hypertension, delivered, current hospitalization 04/09/2011    Objective:    BP (!) 151/95   Pulse 92   Temp 98.4 F (36.9 C)   Wt 180 lb 12.8 oz (82 kg)   LMP 08/17/2015   BMI 30.09 kg/m  FHT: 150 BPM  Uterine Size: 37 cm and size equals dates  Presentations: cephalic  Pelvic Exam: deferred    Serial B/P's: 140's-150's/90's. NST: 10X10 accels X2, occasional contractions. No decels.   24 hour protein 3 days ago WNL.  04/27/16: protein/creatnine ratio: 1058 mg/g Assessment:    Pregnancy @ 5357w1d weeks   Non-reactive NST  Preeclampsia at Term  Hx of PIH  Hx of PTD: completed course of 17-P  Proteinuria  Plan:    Sent to MAU for preeclampsia.   Plans for delivery: Vaginal anticipated; labs reviewed; problem list updated Counseling: Consent signed. Infant feeding: plans to breastfeed. Cigarette smoking: never smoked. L&D discussion: symptoms of labor, discussed when to call, discussed what number to call, anesthetic/analgesic options reviewed and delivering clinician:  plans no preference. Postpartum supports and preparation: circumcision discussed and contraception plans discussed.  Follow up in 2 Weeks postpartum if delivered.

## 2016-05-03 NOTE — H&P (Signed)
LABOR AND DELIVERY ADMISSION HISTORY AND PHYSICAL NOTE  Farris Blash is a 35 y.o. female [phone] with IUP at [redacted]w[redacted]d by early ultrasound presenting for HA and elevated blood pressure.   Patient presented to OB this morning and subsequently sent to the MAU with HA. Described as bi frontal, 10/10 and throbbing. HA started last night which resolved with 1000mg  of tylenol. Patient woke this morning with recurrence of HA.   Of Note: Patient's last dose of Procardia was this morning. No history of HA or HTN prior to pregnancy. Patient is being managed at [facility] for Migraines  States that contractions started last night, unable to assess timing of contractions. No contractions felt in the room. Denies Vaginal discharge, bleeding, dysuria (but endorses pressure with urination).  She reports positive fetal movement. She denies leakage of fluid or vaginal bleeding.  Prenatal History/Complications:  Past Medical History: Past Medical History:  Diagnosis Date  . Gallstone   . Headache(784.0)   . Hypertension   . Pregnancy induced hypertension    after 2nd delivery  . Vaginal Pap smear, abnormal     Past Surgical History: Past Surgical History:  Procedure Laterality Date  . DILATION AND CURETTAGE OF UTERUS    . LEEP      Obstetrical History: OB History    Gravida Para Term Preterm AB Living   8 2 1 1 5 2    SAB TAB Ectopic Multiple Live Births   4 1 0 0 2      Social History: Social History   Social History  . Marital status: Married    Spouse name: N/A  . Number of children: N/A  . Years of education: N/A   Social History Main Topics  . Smoking status: Never Smoker  . Smokeless tobacco: Never Used  . Alcohol use No  . Drug use: No  . Sexual activity: Yes    Birth control/ protection: None   Other Topics Concern  . None   Social History Narrative  . None    Family History: Family History  Problem Relation Age of Onset  . Asthma Mother   .  Hypertension Mother   . Asthma Maternal Grandmother     Allergies: No Known Allergies  Prescriptions Prior to Admission  Medication Sig Dispense Refill Last Dose  . acetaminophen (TYLENOL) 500 MG tablet Take 1,000 mg by mouth every 6 (six) hours as needed for mild pain, moderate pain or headache.    05/03/2016  . butalbital-acetaminophen-caffeine (FIORICET, ESGIC) 50-325-40 MG tablet Take 1-2 tablets by mouth every 6 (six) hours as needed for headache. 20 tablet 0 05/03/2016  . metoCLOPramide (REGLAN) 10 MG tablet Take 1 tablet (10 mg total) by mouth every 6 (six) hours as needed for nausea. 30 tablet 0 05/03/2016  . NIFEdipine (PROCARDIA XL) 30 MG 24 hr tablet Take 1 tablet (30 mg total) by mouth daily. 30 tablet 4 05/03/2016  . Prenatal Vit-Fe Fumarate-FA (PRENATAL MULTIVITAMIN) TABS tablet Take 1 tablet by mouth daily.    05/03/2016     Review of Systems   All systems reviewed and negative except as stated in HPI  Blood pressure 137/67, pulse 85, temperature 98.2 F (36.8 C), resp. rate 18, last menstrual period 08/17/2015. General appearance: alert and cooperative Lungs: clear to auscultation bilaterally Heart: regular rate and rhythm Abdomen: soft, non-tender; bowel sounds normal Extremities: No calf swelling or tenderness Fetal monitoring: Ultrasound Uterine activity: Contractions >4min Dilation: 3 Effacement (%): 50 Station: -2 Exam by:: [employer]  Prenatal labs: ABO, Rh: O/Positive/-- (04/13 1417) Antibody: Negative (04/13 1417) Rubella: !Error! RPR: Non Reactive (08/16 1100)  HBsAg: Negative (04/13 1417)  HIV: Non Reactive (08/16 1100)  GBS: Positive (10/11 1522)  1 hr Glucola: 130 Genetic screening:  No  Prenatal Transfer Tool  Maternal Diabetes: No Maternal Ultrasounds/Referrals: Normal Fetal Ultrasounds or other Referrals:  None Significant Maternal Medications:  None Significant Maternal Lab Results: Lab values include: Group B Strep positive  Results  for orders placed or performed during the hospital encounter of 05/03/16 (from the past 24 hour(s))  Urinalysis, Routine w reflex microscopic (not at [facility])   Collection Time: 05/03/16 12:40 PM  Result Value Ref Range   Color, Urine YELLOW YELLOW   APPearance CLEAR CLEAR   Specific Gravity, Urine 1.010 1.005 - 1.030   pH 6.5 5.0 - 8.0   Glucose, UA NEGATIVE NEGATIVE mg/dL   Hgb urine dipstick TRACE (A) NEGATIVE   Bilirubin Urine NEGATIVE NEGATIVE   Ketones, ur 15 (A) NEGATIVE mg/dL   Protein, ur NEGATIVE NEGATIVE mg/dL   Nitrite NEGATIVE NEGATIVE   Leukocytes, UA MODERATE (A) NEGATIVE  Protein / creatinine ratio, urine   Collection Time: 05/03/16 12:40 PM  Result Value Ref Range   Creatinine, Urine 43.00 mg/dL   Total Protein, Urine 8 mg/dL   Protein Creatinine Ratio 0.19 (H) 0.00 - 0.15 mg/mg[Cre]  Urine microscopic-add on   Collection Time: 05/03/16 12:40 PM  Result Value Ref Range   Squamous Epithelial / LPF 0-5 (A) NONE SEEN   WBC, UA 6-30 0 - 5 WBC/hpf   RBC / HPF NONE SEEN 0 - 5 RBC/hpf   Bacteria, UA FEW (A) NONE SEEN  CBC   Collection Time: 05/03/16  1:23 PM  Result Value Ref Range   WBC 13.2 (H) 4.0 - 10.5 K/uL   RBC 4.01 3.87 - 5.11 MIL/uL   Hemoglobin 11.4 (L) 12.0 - 15.0 g/dL   HCT 16.1 (L) 09.6 - 04.5 %   MCV 83.8 78.0 - 100.0 fL   MCH 28.4 26.0 - 34.0 pg   MCHC 33.9 30.0 - 36.0 g/dL   RDW 40.9 81.1 - 91.4 %   Platelets 242 150 - 400 K/uL  Comprehensive metabolic panel   Collection Time: 05/03/16  1:23 PM  Result Value Ref Range   Sodium 134 (L) 135 - 145 mmol/L   Potassium 3.3 (L) 3.5 - 5.1 mmol/L   Chloride 103 101 - 111 mmol/L   CO2 22 22 - 32 mmol/L   Glucose, Bld 73 65 - 99 mg/dL   BUN 7 6 - 20 mg/dL   Creatinine, Ser 7.82 0.44 - 1.00 mg/dL   Calcium 8.8 (L) 8.9 - 10.3 mg/dL   Total Protein 7.5 6.5 - 8.1 g/dL   Albumin 3.1 (L) 3.5 - 5.0 g/dL   AST 23 15 - 41 U/L   ALT 19 14 - 54 U/L   Alkaline Phosphatase 113 38 - 126 U/L   Total Bilirubin 0.5  0.3 - 1.2 mg/dL   GFR calc non Af Amer >60 >60 mL/min   GFR calc Af Amer >60 >60 mL/min   Anion gap 9 5 - 15    Patient Active Problem List   Diagnosis Date Noted  . Migraine 03/18/2016  . Subclinical hyperthyroidism 12/09/2015  . Supervision of high risk pregnancy, antepartum 10/29/2015  . Pregnancy induced hypertension, delivered, current hospitalization 04/09/2011    Assessment: Buena Boehm is a 35 y.o. [id number] at [redacted]w[redacted]d here for induction  of labor for gestational hypertension and evaluation for PreE  #GHTN: IOL #labor: Admitted to the labor and delivery floor, plan to initiate induction of labor with Pitocin #HA: Will rehydrate patient with 1L LR bolus and treat HA with Fioricet. Will continue to monitor #Pain: Plan for fentanyl 100mcg q 1hr #FWB: Category 1 tracing with moderate variability, #ID:  GBS positive, initiated Penicillin G #MOF: Breast #MOC: Nexplanon #Circ:  Outpatient   Josue D Santos 05/03/2016, 4:30 PM

## 2016-05-03 NOTE — ED Notes (Deleted)
History     CSN: [id number]  Arrival date and time: 05/03/16 1232   None     Chief Complaint  Patient presents with  . Hypertension   Patient presented to OB this morning and sent to the MAU with  Ha. Described as bi frontal, 10/10 and throbbing. HA started last night which resolved with 1000mg  of tylenol. Patient woke this morning with recurrence of Ha.   Of Note: Patient's last dose of Procardia was this morning. No history of HA or HTN prior to pregnancy. Patient is being managed at [facility] for Migraines  States that contractions started last night, unable to assess timing of contractions. No contractions felt in the room. Denies Vaginal discharge, bleeding, dysuria (but endorses pressure with urination).   Hypertension  This is a recurrent problem. The current episode started today. The problem is unchanged. Pertinent negatives include no blurred vision, chest pain, palpitations or shortness of breath.    OB History    Gravida Para Term Preterm AB Living   8 2 1 1 5 2    SAB TAB Ectopic Multiple Live Births   4 1 0 0 2      Past Medical History:  Diagnosis Date  . Gallstone   . Headache(784.0)   . Hypertension   . Pregnancy induced hypertension    after 2nd delivery  . Vaginal Pap smear, abnormal     Past Surgical History:  Procedure Laterality Date  . DILATION AND CURETTAGE OF UTERUS    . LEEP      Family History  Problem Relation Age of Onset  . Asthma Mother   . Hypertension Mother   . Asthma Maternal Grandmother     Social History  Substance Use Topics  . Smoking status: Never Smoker  . Smokeless tobacco: Never Used  . Alcohol use No    Allergies: No Known Allergies  Prescriptions Prior to Admission  Medication Sig Dispense Refill Last Dose  . acetaminophen (TYLENOL) 500 MG tablet Take 1,000 mg by mouth every 6 (six) hours as needed for mild pain, moderate pain or headache.    Taking  . butalbital-acetaminophen-caffeine (FIORICET,  ESGIC) 50-325-40 MG tablet Take 1-2 tablets by mouth every 6 (six) hours as needed for headache. 20 tablet 0 Taking  . metoCLOPramide (REGLAN) 10 MG tablet Take 1 tablet (10 mg total) by mouth every 6 (six) hours as needed for nausea. 30 tablet 0 Taking  . NIFEdipine (PROCARDIA XL) 30 MG 24 hr tablet Take 1 tablet (30 mg total) by mouth daily. 30 tablet 4 Taking  . Prenatal Vit-Fe Fumarate-FA (PRENATAL MULTIVITAMIN) TABS tablet Take 1 tablet by mouth daily.    Taking    Review of Systems  Constitutional: Negative for chills and fever.  Eyes: Negative for blurred vision and double vision.  Respiratory: Negative for shortness of breath.   Cardiovascular: Negative for chest pain and palpitations.  Gastrointestinal: Negative for abdominal pain, diarrhea, nausea and vomiting.  Neurological: Negative for dizziness.   Physical Exam   Blood pressure 147/84, pulse 95, temperature 98.2 F (36.8 C), resp. rate 18, last menstrual period 08/17/2015.  Physical Exam  Constitutional: She is oriented to person, place, and time. She appears well-developed. She appears distressed.  HENT:  Head: Normocephalic and atraumatic.  Eyes: Conjunctivae and EOM are normal. Pupils are equal, round, and reactive to light.  Cardiovascular: Normal rate, regular rhythm and intact distal pulses.   Respiratory: Effort normal. No respiratory distress. She has no wheezes. She exhibits  no tenderness.  GI: Bowel sounds are normal. There is tenderness.  Tenderness to light palpation in epigastric region  Musculoskeletal: Normal range of motion. She exhibits no edema.  Neurological: She is alert and oriented to person, place, and time.  No focal neurologic deficits on exam  Psychiatric: She has a normal mood and affect. Her behavior is normal. Judgment and thought content normal.    MAU Course  Procedures  Assessment and Plan  Patient presents with elevated blood pressure and Ha. Labs are not concerning for preE at this  time. Will rehydrate patient with 1L LR bolus and treat HA with Fioricet. Will continue to monitor patient for improvement  Axtyn Woehler D Lorenia Hoston 05/03/2016, 1:46 PM

## 2016-05-03 NOTE — Progress Notes (Signed)
Patient was seen and examined at the bedside. Pitocin started at 1729 ( 4 and 50%, -1 station). Patient continues to complain about HA S/P 2x Fentanyl and 1x Fioricet. Blood pressure is stable with most recent being 127/88. Discussed with patient the need for Magnesium sulfate at this time and answered questions as they were presented. Patient agrees with plan

## 2016-05-03 NOTE — MAU Note (Signed)
Pt presents to MAU from physician's office for an increase in blood pressures. Pt reports headache since early this morning, denies any blurred vision. Denies any vaginal bleeding or abnormal discharge

## 2016-05-03 NOTE — Progress Notes (Signed)
Patient had contractions last night- but nothing today. She has a headache that she can't make go away and she just doesn't feel good.

## 2016-05-04 ENCOUNTER — Ambulatory Visit (HOSPITAL_COMMUNITY): Admission: RE | Admit: 2016-05-04 | Payer: Medicaid Other | Source: Ambulatory Visit

## 2016-05-04 DIAGNOSIS — O1490 Unspecified pre-eclampsia, unspecified trimester: Secondary | ICD-10-CM

## 2016-05-04 LAB — CBC
HCT: 33.6 % — ABNORMAL LOW (ref 36.0–46.0)
HEMOGLOBIN: 11.6 g/dL — AB (ref 12.0–15.0)
MCH: 28.4 pg (ref 26.0–34.0)
MCHC: 34.5 g/dL (ref 30.0–36.0)
MCV: 82.4 fL (ref 78.0–100.0)
PLATELETS: 253 10*3/uL (ref 150–400)
RBC: 4.08 MIL/uL (ref 3.87–5.11)
RDW: 14.6 % (ref 11.5–15.5)
WBC: 14.7 10*3/uL — ABNORMAL HIGH (ref 4.0–10.5)

## 2016-05-04 LAB — PATHOLOGIST SMEAR REVIEW
PATH REV PLTS: NORMAL
Path Rev RBC: NORMAL

## 2016-05-04 LAB — COMPREHENSIVE METABOLIC PANEL
ALK PHOS: 111 U/L (ref 38–126)
ALT: 17 U/L (ref 14–54)
ANION GAP: 6 (ref 5–15)
AST: 23 U/L (ref 15–41)
Albumin: 2.9 g/dL — ABNORMAL LOW (ref 3.5–5.0)
BUN: <5 mg/dL — ABNORMAL LOW (ref 6–20)
CALCIUM: 7.2 mg/dL — AB (ref 8.9–10.3)
CO2: 25 mmol/L (ref 22–32)
Chloride: 102 mmol/L (ref 101–111)
Creatinine, Ser: 0.58 mg/dL (ref 0.44–1.00)
GFR calc non Af Amer: >60 mL/min (ref >60)
Glucose, Bld: 95 mg/dL (ref 65–99)
Potassium: 3.7 mmol/L (ref 3.5–5.1)
SODIUM: 133 mmol/L — AB (ref 135–145)
Total Bilirubin: 0.5 mg/dL (ref 0.3–1.2)
Total Protein: 7.3 g/dL (ref 6.5–8.1)

## 2016-05-04 LAB — RPR: RPR Ser Ql: NONREACTIVE

## 2016-05-04 MED ORDER — NIFEDIPINE ER OSMOTIC RELEASE 30 MG PO TB24
30.0000 mg | ORAL_TABLET | Freq: Every day | ORAL | Status: DC
  Administered 2016-05-04 – 2016-05-06 (×3): 30 mg via ORAL
  Filled 2016-05-04 (×3): qty 1

## 2016-05-04 NOTE — Progress Notes (Signed)
Pt requests medication.

## 2016-05-04 NOTE — Progress Notes (Signed)
Post Partum Day #1 Subjective: no complaints, up ad lib, voiding, tolerating PO and states HA is improved.  Objective: Blood pressure (!) 147/94, pulse 79, temperature 98.6 F (37 C), temperature source Oral, resp. rate 16, height 5\' 5"  (1.651 m), weight 180 lb (81.6 kg), last menstrual period 08/17/2015, unknown if currently breastfeeding.  Physical Exam:  General: alert, cooperative and no distress Lochia: appropriate Uterine Fundus: firm Incision: none DVT Evaluation: No evidence of DVT seen on physical exam. No cords or calf tenderness. No significant calf/ankle edema.  +2DTRs   Recent Labs  05/03/16 1953 05/04/16 0551  HGB 12.0 11.6*  HCT 34.9* 33.6*    Assessment/Plan: Breastfeeding, Lactation consult and Contraception planning Nexplanon.  Possible D/C home on 05/05/16 pending stable blood pressures.  Procardia started 05/04/16 for pressure of 147/94 at 0800.     LOS: 1 day   Brittany [name], CNM 05/04/2016, 9:02 AM

## 2016-05-04 NOTE — Progress Notes (Signed)
Patient ID: Brittany [name], female   DOB: Sep 28, 1980, 35 y.o.   MRN: [id number]  POSTPARTUM PROGRESS NOTE  Post Partum Day 1 Subjective:  Brittany [name] is a 35 y.o. [id number] 10426w1d s/p SVD yesterday evening.  No acute events overnight.  Pt denies problems with ambulating, voiding or po intake.  She denies nausea or vomiting.  Pain is moderately controlled.  She has not had flatus. She has not had bowel movement.  Lochia Moderate. Denies headache, vision changes, abdominal pain.  Objective: Blood pressure 126/76, pulse 79, temperature 97.5 F (36.4 C), temperature source Oral, resp. rate 16, height 5\' 5"  (1.651 m), weight 81.6 kg (180 lb), last menstrual period 08/17/2015, unknown if currently breastfeeding.  Physical Exam:  General: alert, cooperative and no distress Lochia:normal flow Chest: CTAB Heart: RRR no m/r/g Abdomen: +BS, soft, nontender,  Uterine Fundus: firm, below level of umbilicus DVT Evaluation: No calf swelling or tenderness Extremities: no edema   Recent Labs  05/03/16 1953 05/04/16 0551  HGB 12.0 11.6*  HCT 34.9* 33.6*    Assessment/Plan:  ASSESSMENT: Brittany [name] is a 35 y.o. [id number] 426w1d s/p SVD  Plan for discharge tomorrow  Discontinue Mag drip tonight 2245   LOS: 1 day   Tillman Sersngela C Brion Hedges, DO 05/04/2016, 7:45 AM

## 2016-05-04 NOTE — Lactation Note (Signed)
This note was copied from a baby's chart. Lactation Consultation Note  Patient Name: Brittany [name] WUJWJ'XToday's Date: 05/04/2016 Reason for consult: Follow-up assessment   With this mom and early term baby, who is SGA, weight under 5 pounds a birth. I reweighed the baby, and he is now at 3.4% weight loss. He is not hungry, and was last fed 3 1/2 hours ago. He took 1 ml of formula and 2 ml's of EBM. He is gagging, and has a very full, mild distended abdomen, but just passed a moderate  Sized pasty brown stool. I  gave this information to CNS Joni Reiningicole, and she will have the NNP Adalberto IllLauren Rafikki examine the baby .    Maternal Data    Feeding Feeding Type: Formula Nipple Type: Slow - flow  LATCH Score/Interventions Latch: Too sleepy or reluctant, no latch achieved, no sucking elicited.  Audible Swallowing:  (mom pumped 4 oz of transitional milk)  Type of Nipple: Everted at rest and after stimulation  Comfort (Breast/Nipple): Filling, red/small blisters or bruises, mild/mod discomfort  Problem noted: Filling        Lactation Tools Discussed/Used Tools: Flanges Flange Size:  (decreased mom to size 21 flanges with a good fit)   Consult Status Consult Status: Follow-up Date: 05/05/16 Follow-up type: In-patient    Alfred LevinsLee, Lesa Vandall Anne 05/04/2016, 6:37 PM

## 2016-05-04 NOTE — Lactation Note (Signed)
This note was copied from a baby's chart. Lactation Consultation Note Experienced BF mom BF her now 885 yr old for 4 yrs. Denies any issues. Mom has long pendulum breast, flat nipples, very compressible. Hand expression w/easy flow of colostrum squirts. Hand expressed 20ml from Lt. Breast. Mom shown how to use DEBP & how to disassemble, clean, & reassemble parts.Mom knows to pump q3h for 15-20 min. Mom pumped w/93ml from Rt. Breast. Nothing from Lt. Mom encouraged to feed baby 8-12 times/24 hours and with feeding cues.  Educated about newborn behavior of baby's less than 5 lbs, I&O, importance of waking baby to feed, supply and demand. Referred to Baby and Me Book in Breastfeeding section Pg. 22-23 for position options and Proper latch demonstration. Mom encouraged to do skin-to-skin.  Attempted to bottle feed colostrum w/slow flow nipple. Baby had no interest in suckling. Attempted to spoon feed baby, baby tongue thrusting, wouldn't swallow colostrum. RN stated she gave some colostrum on spoon. Baby spit up yellow sputum. Discussed w/mom keeping baby warm, not feeding longer than 30 min. The need to supplement, with colostrum since she has enough for the baby at this time. No need for formula at this time. Educated on baby's w/mag and less than 5lbs.  WH/LC brochure given w/resources, support groups and LC services. Patient Name: Boy Vallarie MareStacy Satterwhite ZOXWR'UToday's Date: 05/04/2016 Reason for consult: Initial assessment   Maternal Data    Feeding Feeding Type: Breast Milk  LATCH Score/Interventions                      Lactation Tools Discussed/Used Tools: Pump Breast pump type: Double-Electric Breast Pump   Consult Status      Shawndale Kilpatrick G 05/04/2016, 5:47 AM

## 2016-05-04 NOTE — Lactation Note (Signed)
This note was copied from a baby's chart. Lactation Consultation Note  Patient Name: Boy Vallarie MareStacy Zeitz ZOXWR'UToday's Date: 05/04/2016  Mom is attempting to put baby to breast but has been sleepy.  Mom is requesting a nipple shield because it was helpful with last baby.  A 20 mm nipple shield given with instructions to call for feeding assist.  Late preterm handout given and reviewed.  Stressed importance of establishing milk supply by pumping 8-12 times in 24 hours.   Maternal Data    Feeding    LATCH Score/Interventions                      Lactation Tools Discussed/Used     Consult Status      Huston FoleyMOULDEN, Banks Chaikin S 05/04/2016, 9:40 AM

## 2016-05-05 ENCOUNTER — Encounter: Payer: Medicaid Other | Admitting: Certified Nurse Midwife

## 2016-05-05 ENCOUNTER — Encounter: Payer: Medicaid Other | Admitting: Obstetrics & Gynecology

## 2016-05-05 MED ORDER — SODIUM CHLORIDE 0.9% FLUSH
3.0000 mL | INTRAVENOUS | Status: DC | PRN
  Administered 2016-05-05: 3 mL via INTRAVENOUS
  Filled 2016-05-05: qty 3

## 2016-05-05 NOTE — Lactation Note (Signed)
This note was copied from a baby's chart. Lactation Consultation Note Mom states baby is BF much better.  Mom putting to breast w/o NS. Mom acted like she didn't want LC in at this time. Encouraged mom to call for assistance or questions. Stress importance of BF and pumping. Mom stated baby is acting hungry and wanting to eat now. Stress the importance of feeding and post pumping. Stressed I&O documentation.  Patient Name: Brittany Vallarie MareStacy Lammert WUJWJ'XToday's Date: 05/05/2016 Reason for consult: Follow-up assessment;Infant < 6lbs;Infant weight loss   Maternal Data    Feeding Feeding Type: Breast Fed Length of feed: 20 min  LATCH Score/Interventions                      Lactation Tools Discussed/Used     Consult Status Consult Status: Follow-up Date: 05/05/16 Follow-up type: In-patient    [name], Diamond NickelLAURA G 05/05/2016, 6:04 AM

## 2016-05-05 NOTE — Lactation Note (Signed)
This note was copied from a baby's chart. Lactation Consultation Note  Patient Name: Brittany [name]: 05/05/2016  Mom is putting baby to breast, post pumping and supplementing with expressed milk/formula per bottle.  Encouraged to call for feeding assist or concerns.   Maternal Data    Feeding    LATCH Score/Interventions                      Lactation Tools Discussed/Used     Consult Status      Huston FoleyMOULDEN, [name] 05/05/2016, 4:25 PM

## 2016-05-06 MED ORDER — AMLODIPINE BESYLATE 10 MG PO TABS: 10.0000 mg | ORAL_TABLET | Freq: Every day | ORAL | 5 refills | Status: DC

## 2016-05-06 MED ORDER — OXYCODONE HCL 10 MG PO TABS: 10.0000 mg | ORAL_TABLET | ORAL | 0 refills | Status: DC | PRN

## 2016-05-06 MED ORDER — METHYLDOPA 250 MG PO TABS
250.0000 mg | ORAL_TABLET | Freq: Two times a day (BID) | ORAL | Status: DC
  Administered 2016-05-06: 250 mg via ORAL
  Filled 2016-05-06 (×4): qty 1

## 2016-05-06 MED ORDER — IBUPROFEN 800 MG PO TABS: 800.0000 mg | ORAL_TABLET | Freq: Three times a day (TID) | ORAL | 2 refills | Status: DC | PRN

## 2016-05-06 MED ORDER — METOPROLOL-HYDROCHLOROTHIAZIDE 100-25 MG PO TABS: 1.0000 | ORAL_TABLET | Freq: Every day | ORAL | 5 refills | Status: DC

## 2016-05-06 MED ORDER — METHYLDOPA 250 MG PO TABS: 250.0000 mg | ORAL_TABLET | Freq: Two times a day (BID) | ORAL | 2 refills | Status: DC

## 2016-05-06 MED ORDER — NIFEDIPINE ER 30 MG PO TB24: 30.0000 mg | ORAL_TABLET | Freq: Every day | ORAL | 5 refills | Status: DC

## 2016-05-06 MED ORDER — COCONUT OIL OIL: 1.0000 "application " | TOPICAL_OIL | 99 refills | Status: DC | PRN

## 2016-05-06 MED ORDER — DOCUSATE SODIUM 100 MG PO CAPS: 100.0000 mg | ORAL_CAPSULE | Freq: Two times a day (BID) | ORAL | 2 refills | Status: DC

## 2016-05-06 NOTE — Progress Notes (Signed)
Post Partum Day #2, post induction for severe Pre-eclampsia.   Subjective: no complaints, up ad lib, voiding, tolerating PO and desires breast pump for lactation, infant is doing well with bottlefeeding breast milk.  Objective: Blood pressure 132/89, pulse 76, temperature 98.6 F (37 C), temperature source Oral, resp. rate 18, height 5\' 5"  (1.651 m), weight 180 lb (81.6 kg), last menstrual period 08/17/2015, SpO2 99 %, unknown if currently breastfeeding.  Physical Exam:  General: alert, cooperative and no distress Lochia: appropriate Uterine Fundus: firm Incision: none DVT Evaluation: No evidence of DVT seen on physical exam. No cords or calf tenderness. No significant calf/ankle edema.   Recent Labs  05/03/16 1953 05/04/16 0551  HGB 12.0 11.6*  HCT 34.9* 33.6*    Assessment/Plan: Discharge home, Breastfeeding and Contraception Nexplanon in office   LOS: 3 days   Roe CoombsRachelle A Denney, CNM 05/06/2016, 8:28 AM

## 2016-05-06 NOTE — Progress Notes (Signed)
PT given discharge instructions, follow up dates, and pre-eclampsia signs and symptoms reviewed. Pt verbalizes understanding.

## 2016-05-06 NOTE — Discharge Instructions (Signed)
Postpartum Hypertension  Postpartum hypertension is high blood pressure after pregnancy that remains higher than normal for more than two days after delivery. You may not realize that you have postpartum hypertension if your blood pressure is not being checked regularly. In some cases, postpartum hypertension will go away on its own, usually within a week of delivery. However, for some women, medical treatment is required to prevent serious complications, such as seizures or stroke.  The following things can affect your blood pressure:  · The type of delivery you had.  · Having received IV fluids or other medicines during or after delivery.  CAUSES   Postpartum hypertension may be caused by any of the following or by a combination of any of the following:  · Hypertension that existed before pregnancy (chronic hypertension).  · Gestational hypertension.  · Preeclampsia or eclampsia.  · Receiving a lot of fluid through an IV during or after delivery.  · Medicines.  · HELLP syndrome.  · Hyperthyroidism.  · Stroke.  · Other rare neurological or blood disorders.  In some cases, the cause may not be known.  RISK FACTORS  Postpartum hypertension can be related to one or more risk factors, such as:  · Chronic hypertension. In some cases, this may not have been diagnosed before pregnancy.  · Obesity.  · Type 2 diabetes.  · Kidney disease.  · Family history of preeclampsia.  · Other medical conditions that cause hormonal imbalances.  SIGNS AND SYMPTOMS  As with all types of hypertension, postpartum hypertension may not have any symptoms. Depending on how high your blood pressure is, you may experience:  · Headaches. These may be mild, moderate, or severe. They may also be steady, constant, or sudden in onset (thunderclap headache).  · Visual changes.  · Dizziness.  · Shortness of breath.  · Swelling of your hands, feet, lower legs, or face. In some cases, you may have swelling in more than one of these locations.  · Heart  palpitations or a racing heartbeat.  · Difficulty breathing while lying down.  · Decreased urination.  Other rare signs and symptoms may include:  · Sweating more than usual. This lasts longer than a few days after delivery.  · Chest pain.  · Sudden dizziness when you get up from sitting or lying down.  · Seizures.  · Nausea or vomiting.  · Abdominal pain.  DIAGNOSIS  The diagnosis of postpartum hypertension is made through a combination of physical examination findings and testing of your blood and urine. You may also have additional tests, such as a CT scan or an MRI, to check for other complications of postpartum hypertension.  TREATMENT  When blood pressure is high enough to require treatment, your options may include:  · Medicines to reduce blood pressure (antihypertensives). Tell your health care provider if you are breastfeeding or if you plan to breastfeed. There are many antihypertensive medicines that are safe to take while breastfeeding.  · Stopping medicines that may be causing hypertension.  · Treating medical conditions that are causing hypertension.  · Treating the complications of hypertension, such as seizures, stroke, or kidney problems.  Your health care provider will also continue to monitor your blood pressure closely and repeatedly until it is within a safe range for you.   HOME CARE INSTRUCTIONS  · Take medicines only as directed by your health care provider.  · Get regular exercise after your health care provider tells you that it is safe.  · Follow   your health care provider's recommendations on fluid and salt restrictions.  · Do not use any tobacco products, including cigarettes, chewing tobacco, or electronic cigarettes. If you need help quitting, ask your health care provider.  · Keep all follow-up visits as directed by your health care provider. This is important.  SEEK MEDICAL CARE IF:  · Your symptoms get worse.  · You have new symptoms, such as:    Headache.    Dizziness.    Visual  changes.  SEEK IMMEDIATE MEDICAL CARE IF:  · You develop a severe or sudden headache.  · You have seizures.  · You develop numbness or weakness on one side of your body.  · You have difficulty thinking, speaking, or swallowing.  · You develop severe abdominal pain.  · You develop difficulty breathing, chest pain, a racing heartbeat, or heart palpitations.  These symptoms may represent a serious problem that is an emergency. Do not wait to see if the symptoms will go away. Get medical help right away. Call your local emergency services (911 in the [place] Do not drive yourself to the hospital.     This information is not intended to replace advice given to you by your health care provider. Make sure you discuss any questions you have with your health care provider.     Document Released: 03/07/2014 Document Reviewed: 03/07/2014  Elsevier Interactive Patient Education ©2016 [employer]

## 2016-05-06 NOTE — Lactation Note (Signed)
This note was copied from a baby's chart. Lactation Consultation Note  Patient Name: Brittany Vallarie MareStacy Plitt ZOXWR'UToday's Date: 05/06/2016  Visit with mom prior to discharge.  She states baby latching and pumping is going well.  Mom has an abundant milk supply.  [facility]  Mom is taking several bottles of transitional milk home from pumping the last few days.  Instructed to give the oldest milk first and use this supply up first. Reviewed breastmilk storage guidelines.  Reviewed and encouraged to use outpatient lactation services and support.   Maternal Data    Feeding Feeding Type: Breast Fed Length of feed: 20 min  LATCH Score/Interventions                      Lactation Tools Discussed/Used     Consult Status      Huston FoleyMOULDEN, Yoshi Mancillas S 05/06/2016, 12:44 PM

## 2016-05-06 NOTE — Discharge Summary (Signed)
Obstetric Discharge Summary Reason for Admission: severe pre-eclampsia, IOL at 37.1 EGA Prenatal Procedures: NST and ultrasound Intrapartum Procedures: spontaneous vaginal delivery Postpartum Procedures: none Complications-Operative and Postpartum: none Hemoglobin  Date Value Ref Range Status  05/04/2016 11.6 (L) 12.0 - 15.0 g/dL Final   HCT  Date Value Ref Range Status  05/04/2016 33.6 (L) 36.0 - 46.0 % Final   Hematocrit  Date Value Ref Range Status  04/27/2016 33.3 (L) 34.0 - 46.6 % Final    Physical Exam:  General: alert, cooperative and no distress Lochia: appropriate Uterine Fundus: firm Incision: none DVT Evaluation: No evidence of DVT seen on physical exam. No cords or calf tenderness. No significant calf/ankle edema.  Discharge Diagnoses: Term Pregnancy-delivered and Preelampsia  Discharge Information: Date: 05/06/2016 Activity: pelvic rest Diet: routine Medications: PNV, Ibuprofen, Colace, Iron and Percocet Condition: stable Instructions: refer to practice specific booklet Discharge to: home Follow-up Information    Brittany [name] A Brittany [name], CNM Follow up in 1 week(s).   Specialty:  Certified Nurse Midwife Why:  blood pressure check Contact information: [address] [place] [id number] [phone]           Newborn Data: Live born female  Birth Weight: 4 lb 12.7 oz (2175 g) APGAR: 8, 9  Home with mother.  Brittany [name], CNM 05/06/2016, 8:32 AM

## 2016-05-20 IMAGING — US US ABDOMEN COMPLETE
1 series · 15 of 25 positions shown · non-contrast
Comparison: None.

CLINICAL DATA: Abdominal pain, pregnant

EXAM:
ABDOMEN ULTRASOUND COMPLETE

[Series 1: us abdomen complete · 15 of 69 slices shown]
[im 1/69]
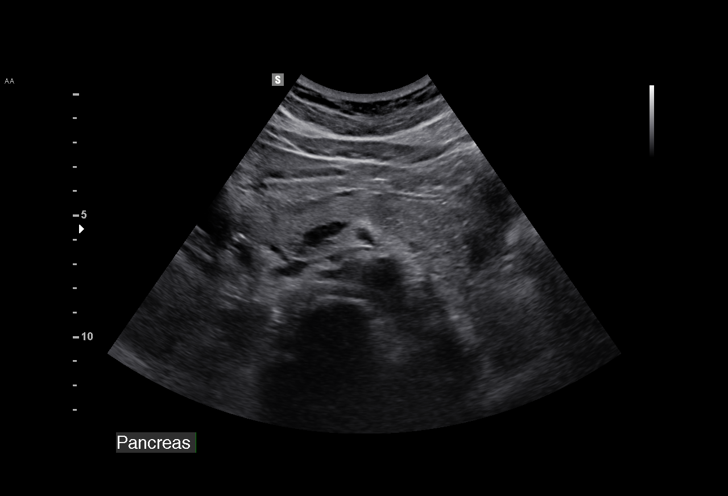
[im 6/69]
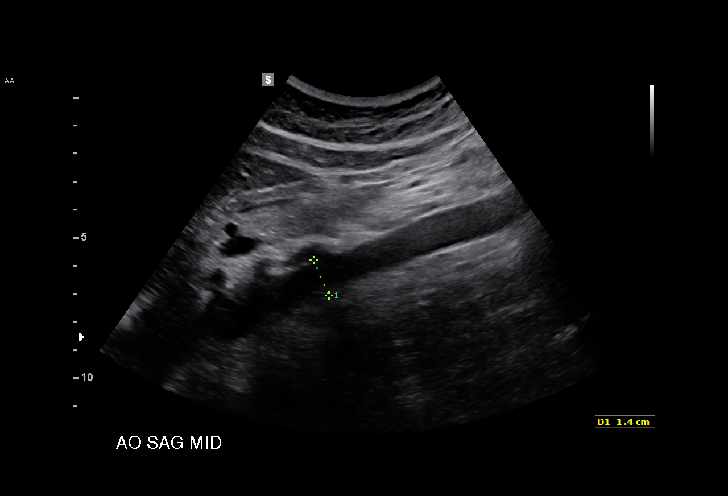
[im 12/69]
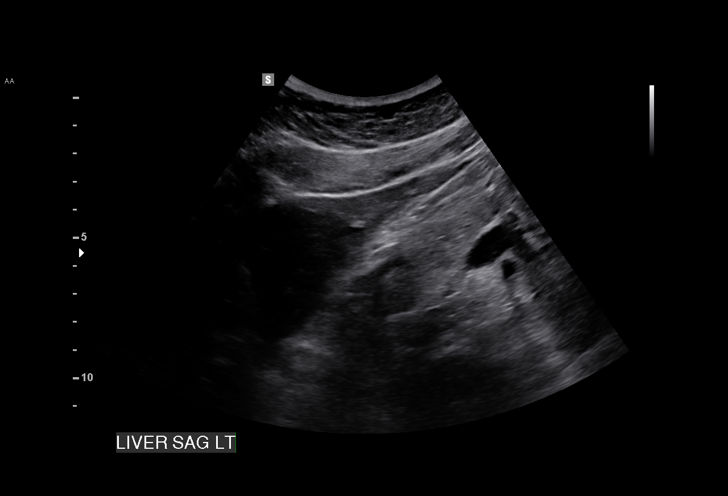
[im 15/69]
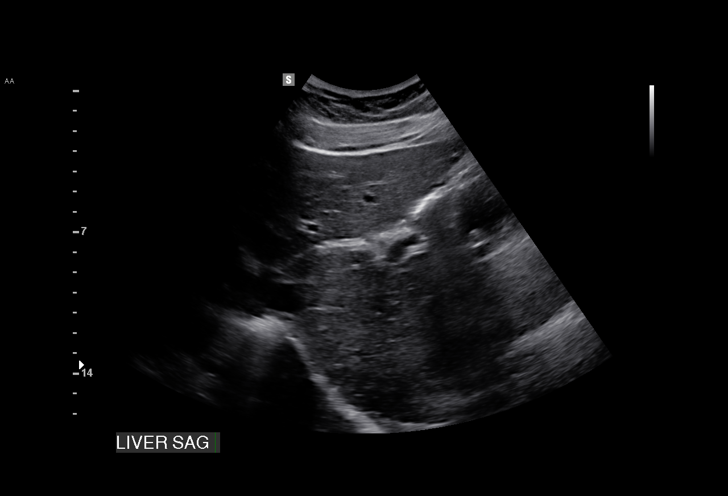
[im 20/69]
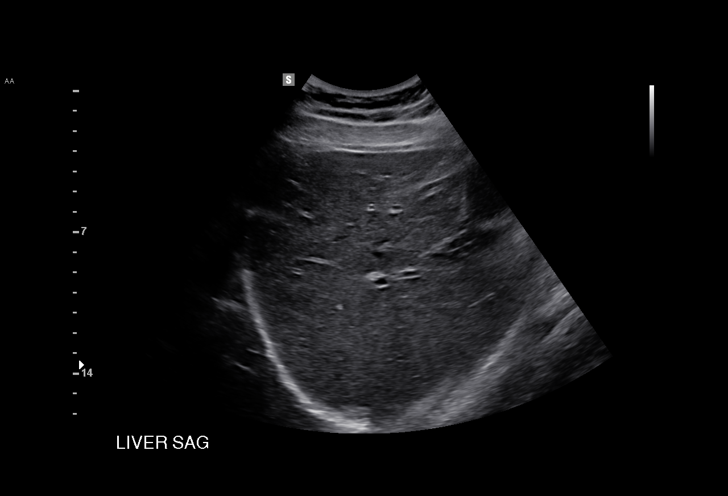
[im 26/69]
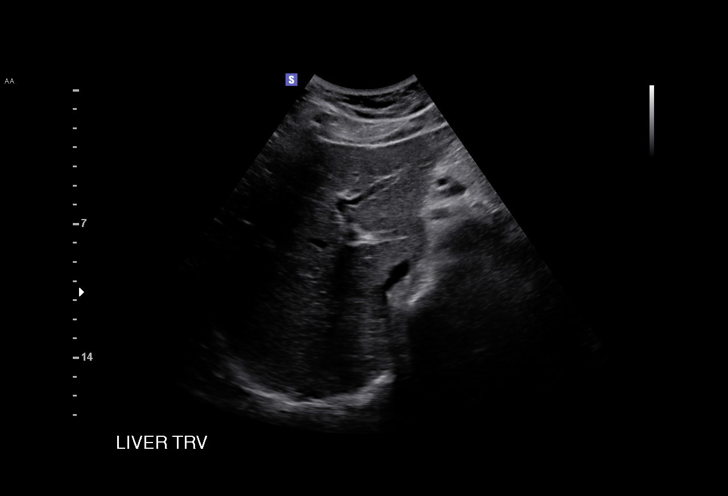
[im 29/69]
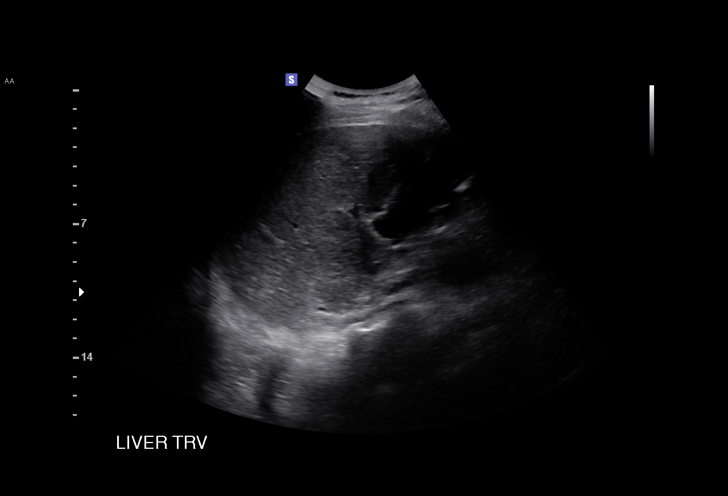
[im 35/69]
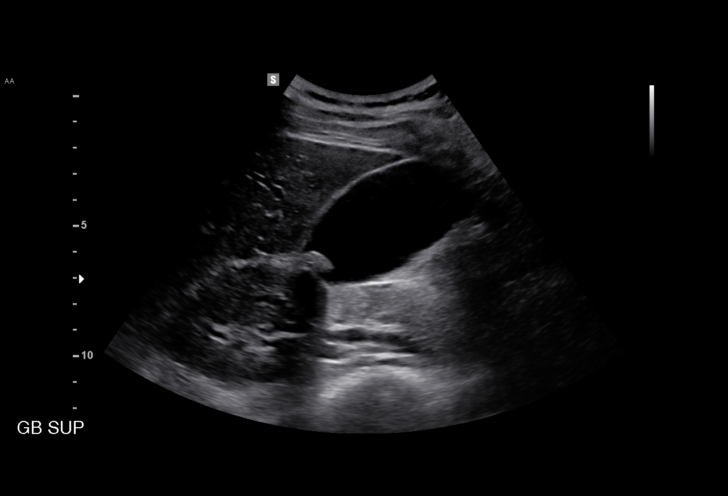
[im 40/69]
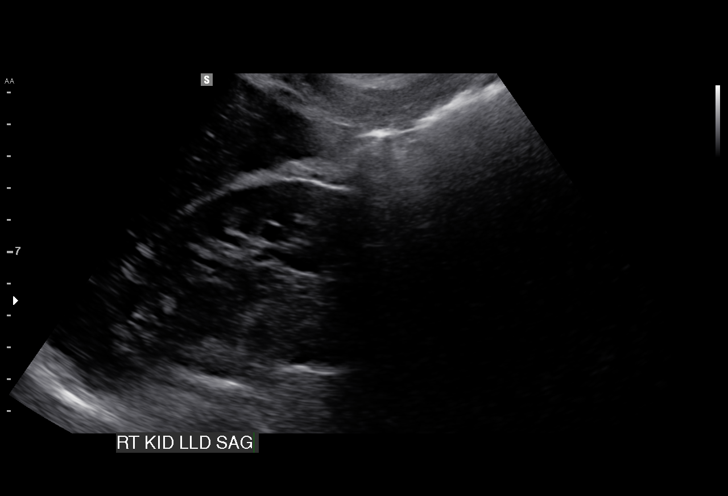
[im 43/69]
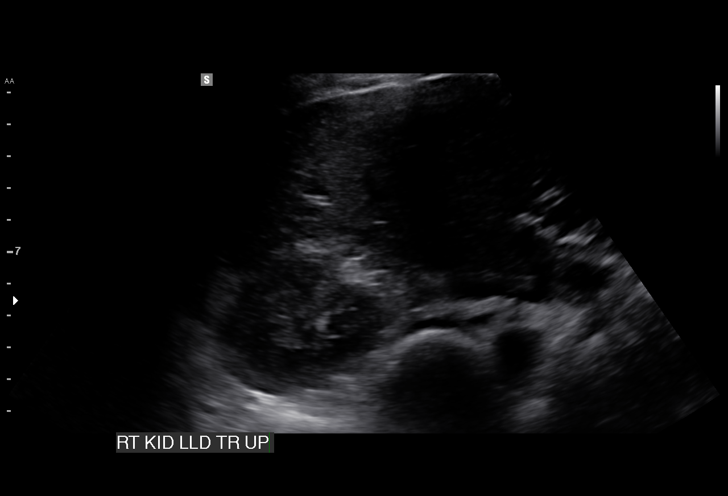
[im 49/69]
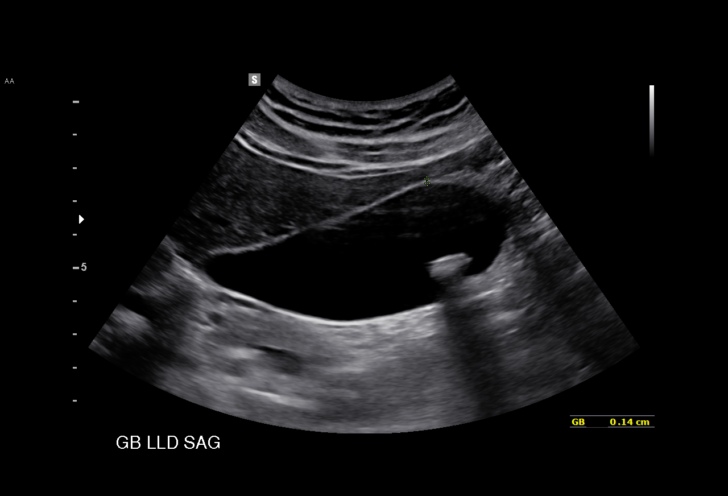
[im 54/69]
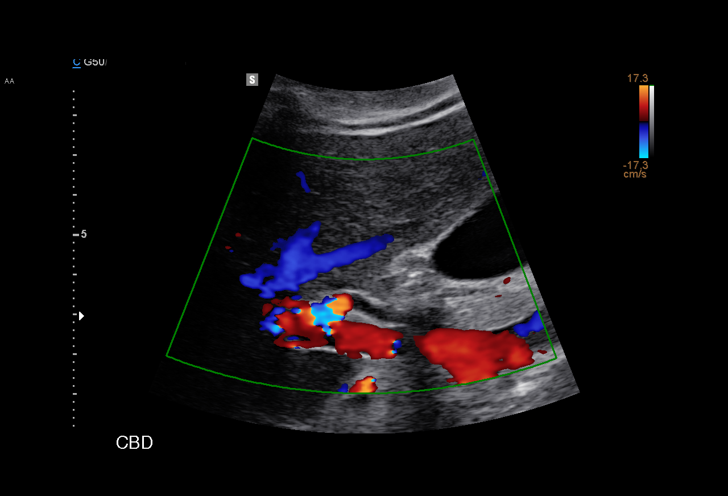
[im 57/69]
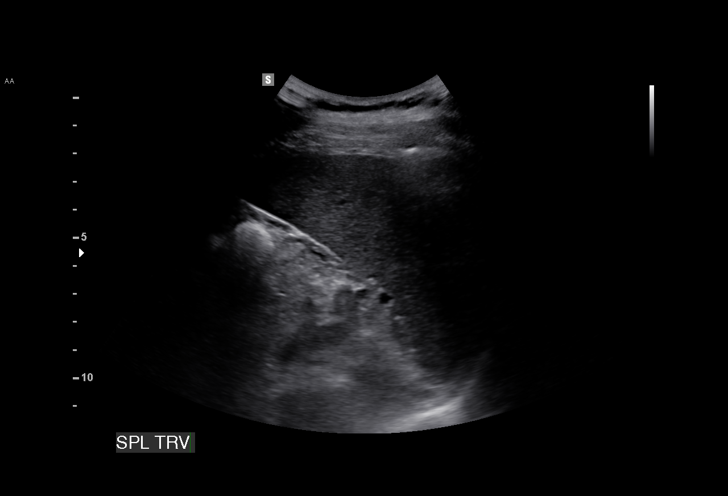
[im 63/69]
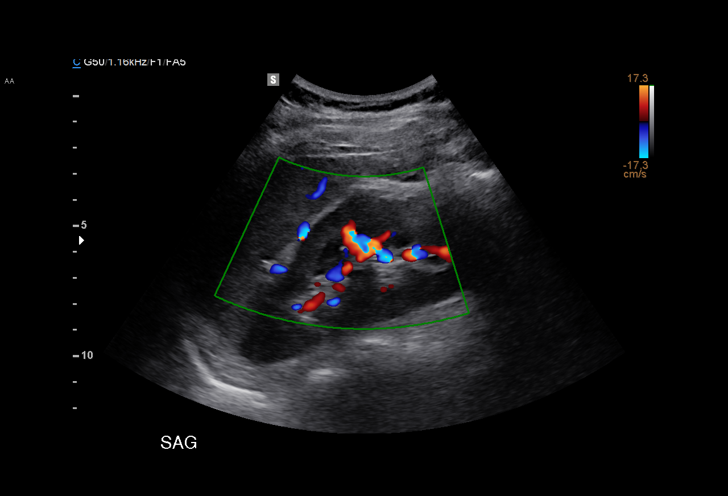
[im 69/69]
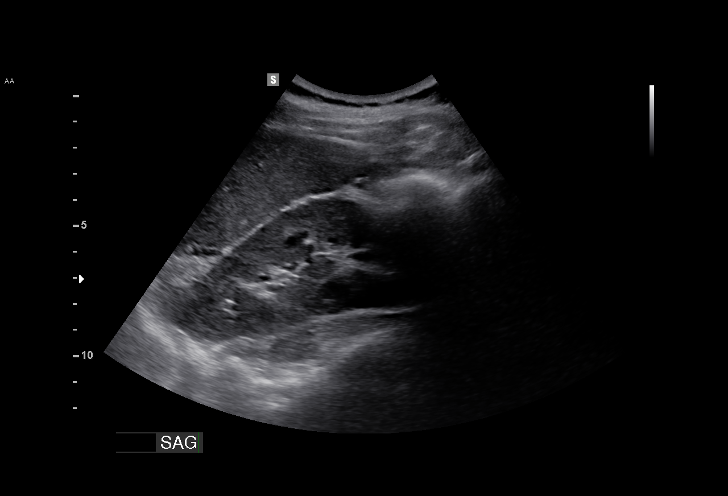

[15 of 25 positions shown; findings below may reference images not displayed]

FINDINGS: Gallbladder: 13 mm mobile gallstone. No gallbladder wall thickening
or pericholecystic fluid. Negative sonographic Murphy's sign.

Common bile duct: Diameter: 3 mm

Liver: No focal lesion identified. Within normal limits in
parenchymal echogenicity.

IVC: No abnormality visualized.

Pancreas: Visualized portion unremarkable.

Spleen: Size and appearance within normal limits.

Right Kidney: Length: 10.4 cm.  No mass or hydronephrosis.

Left Kidney: Length: 10.9 cm.  No mass or hydronephrosis

Abdominal aorta: No aneurysm visualized.

Other findings: None.
IMPRESSION: Cholelithiasis, without associated sonographic findings to suggest
acute cholecystitis.

## 2016-05-20 IMAGING — US US OB COMP LESS 14 WK
1 series · 15 of 18 positions shown · non-contrast
Comparison: none

[Series 1: us ob comp less 14 wk · 18 acquisitions, 15 frames shown]
[im 1/18]
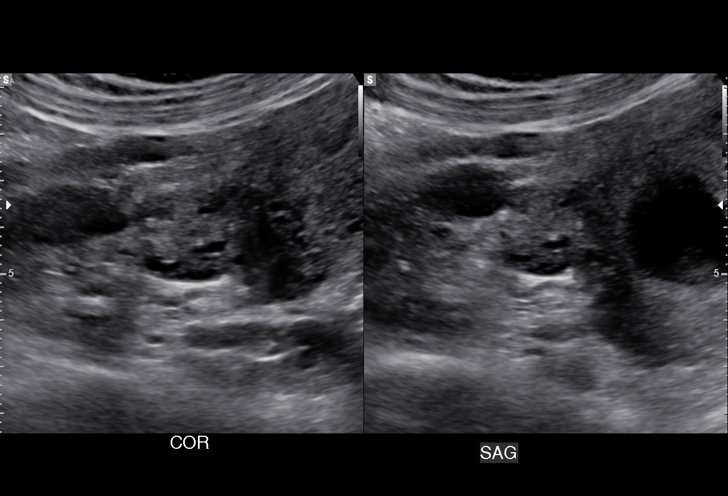
[im 2/18]
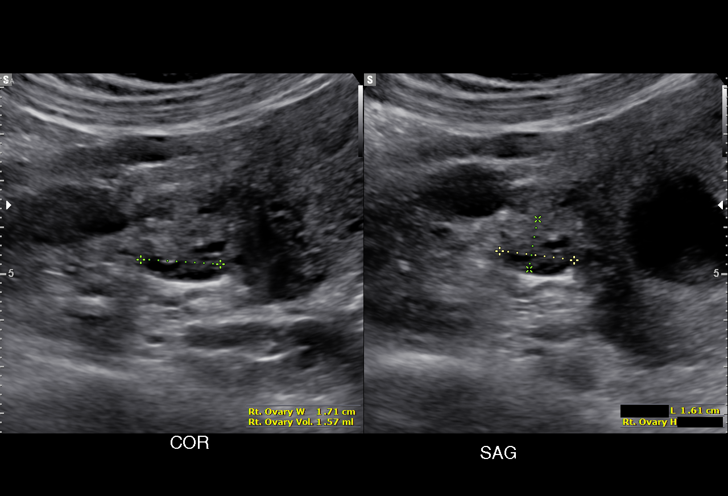
[im 4/18]
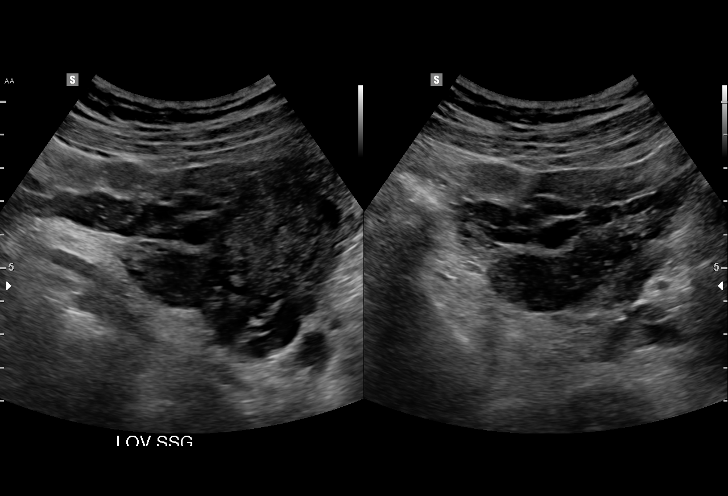
[im 5/18]
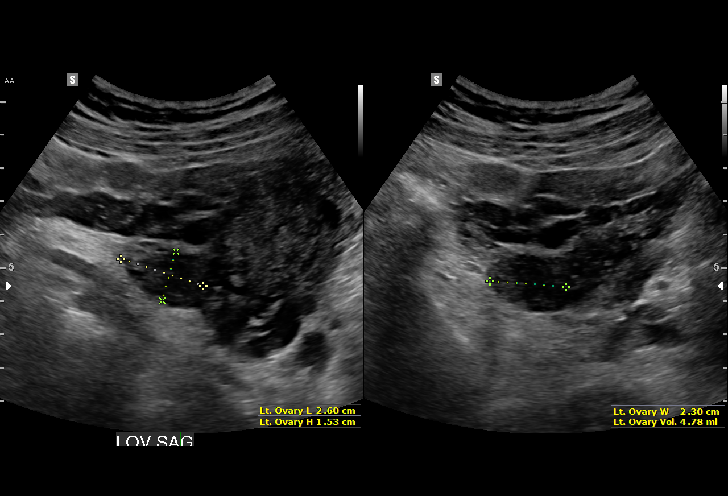
[im 6/18]
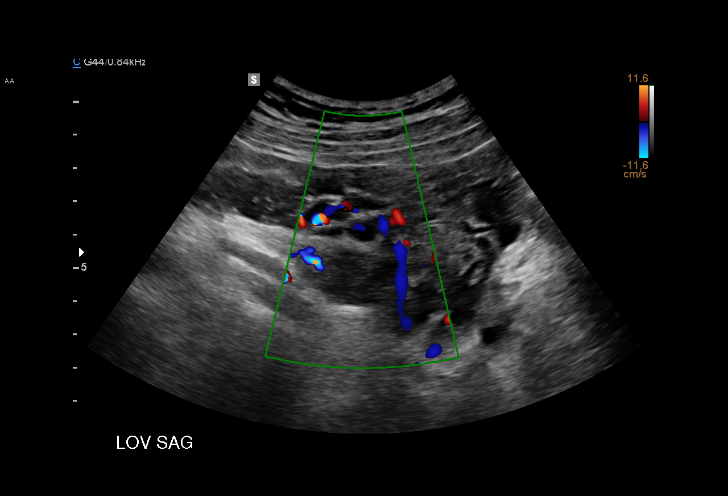
[im 7/18]
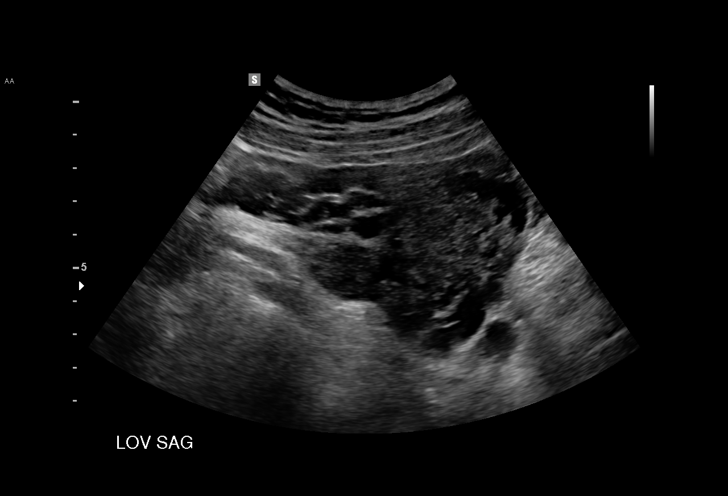
[im 8/18]
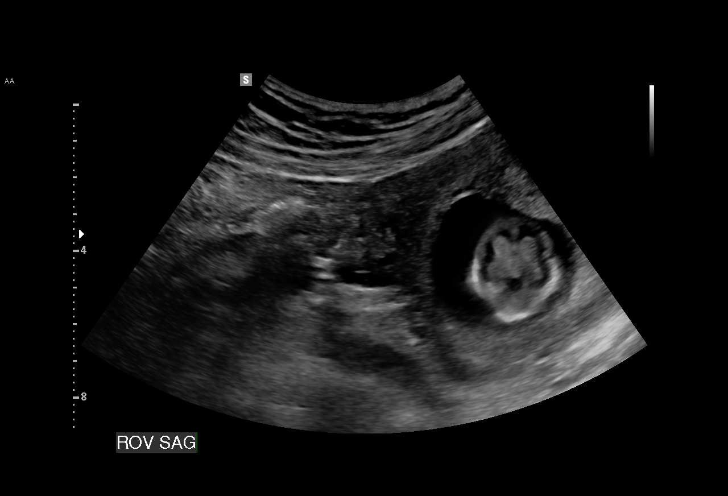
[im 10/18]
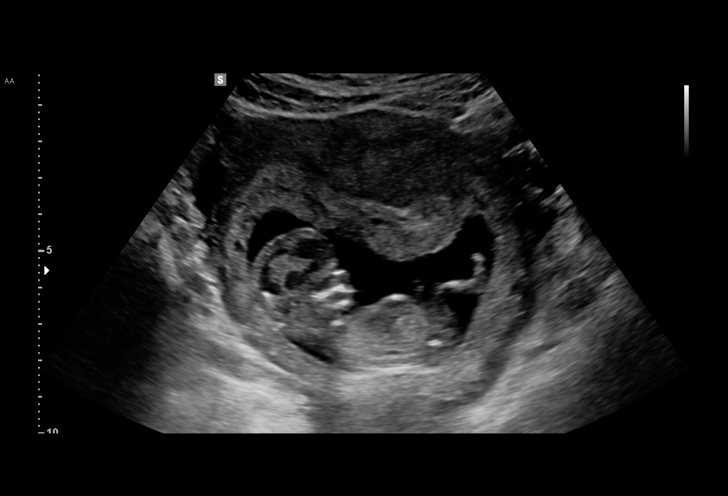
[im 11/18]
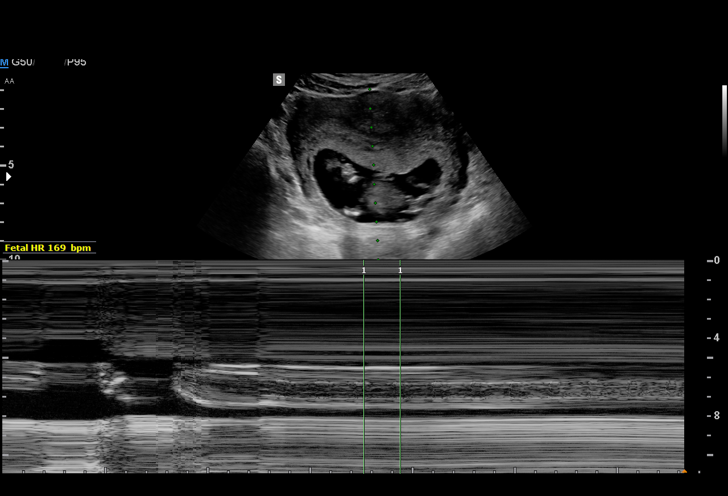
[im 12/18]
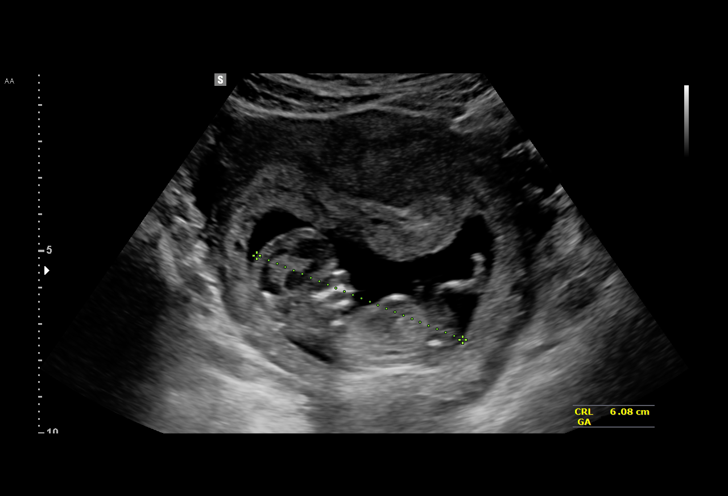
[im 13/18]
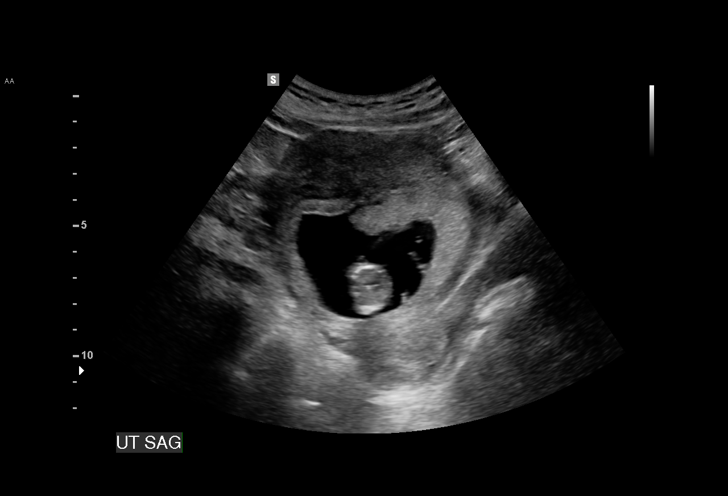
[im 14/18]
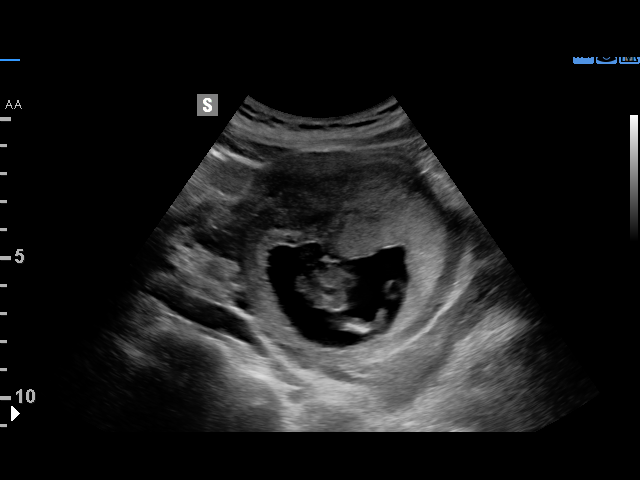
[im 16/18]
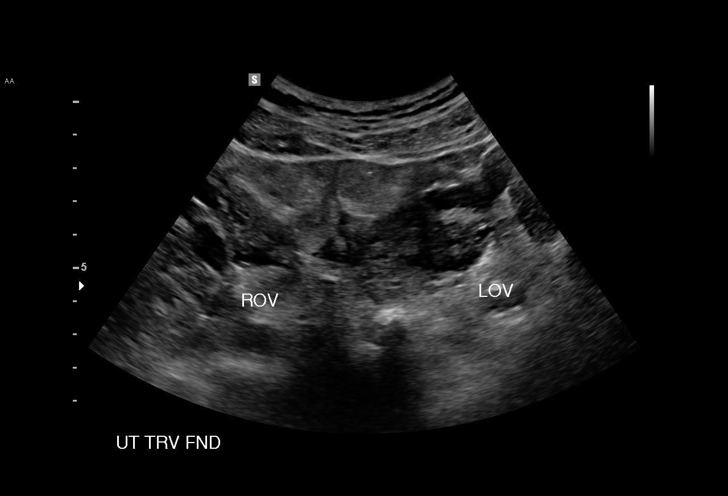
[im 17/18]
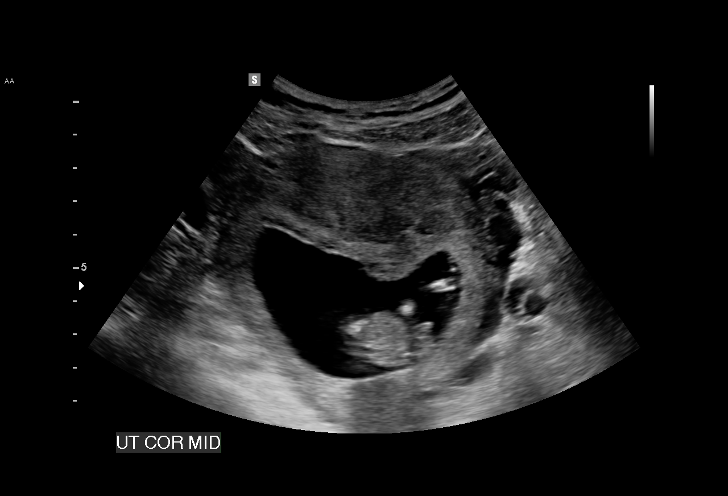
[im 18/18]
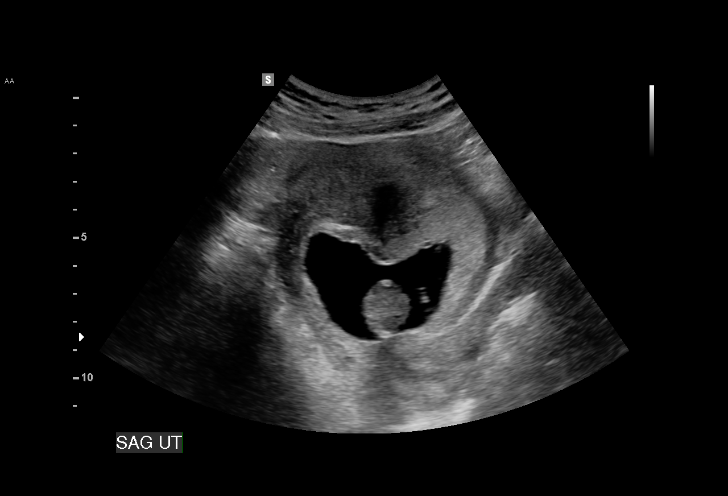

[15 of 18 positions shown; findings below may reference images not displayed]

Road [HOSPITAL]

1  US OB COMP LESS 14 WKS                      [address].0

Indications

13 weeks gestation of pregnancy
First trimester aneuploidy screen (NT)         Z36
Poor obstetric history: Previous
preeclampsia / eclampsia/gestational HTN
Prior poor obstetrical history antepartum,
third trimester PPROM 36 wks
OB History

Gravidity:    8         Term:   1        Prem:   1        SAB:   2
Living:       2
Fetal Evaluation

Num Of Fetuses:     1
Fetal Heart         151
Rate(bpm):
Cardiac Activity:   Observed

Amniotic Fluid
AFI FV:      Subjectively within normal limits
Biometry

CRL:      62.4  mm     G. Age:  12w 3d                  EDD:   05/29/16
NT:       1.83  mm
Gestational Age

LMP:           13w 2d       Date:   08/17/15                 EDD:   05/23/16
Best:          13w 2d    Det. By:   LMP  (08/17/15)          EDD:   05/23/16
Anatomy

Cranium:               Appears normal         Bladder:                Appears normal
Choroid Plexus:        Appears normal

Other:  Technically difficult due to early gestational age.
Cervix Uterus Adnexa

Uterus
No abnormality visualized.

Left Ovary
Within normal limits.

Right Ovary
Previously seen
Comments

Patient referred for NT measurement as part of first trimester
screen.  We were unable to adequately measure NT due to
fetal position.  After discussion of her genetic testing options
the patient decided to return for a sceond attempt in 2 days.
Impression

Single living intrauterine pregnancy at 13 weeks 2 days.
CRL consistent with EDC based on LMP.
Pregnancy should be dated by LMP.
No gross fetal anomalies identified.
Unsuccessful NT measurement.
Recommendations

Recommend follow-up ultrasound examination in 2 day for
reattempt at NT measurement.

## 2016-05-23 ENCOUNTER — Encounter: Payer: Self-pay | Admitting: Obstetrics

## 2016-05-23 ENCOUNTER — Ambulatory Visit (INDEPENDENT_AMBULATORY_CARE_PROVIDER_SITE_OTHER): Payer: Medicaid Other | Admitting: Obstetrics

## 2016-05-23 DIAGNOSIS — Z30011 Encounter for initial prescription of contraceptive pills: Secondary | ICD-10-CM

## 2016-05-23 DIAGNOSIS — Z3009 Encounter for other general counseling and advice on contraception: Secondary | ICD-10-CM

## 2016-05-23 DIAGNOSIS — Z8719 Personal history of other diseases of the digestive system: Secondary | ICD-10-CM

## 2016-05-23 MED ORDER — NORETHINDRONE 0.35 MG PO TABS: 1.0000 | ORAL_TABLET | Freq: Every day | ORAL | 11 refills | Status: DC

## 2016-05-23 NOTE — Progress Notes (Signed)
Subjective:     Brittany [name] is a 35 y.o. female who presents for a postpartum visit. She is 3 weeks postpartum following a spontaneous vaginal delivery. I have fully reviewed the prenatal and intrapartum course. The delivery was at 37 gestational weeks. Outcome: spontaneous vaginal delivery. Anesthesia: none. Postpartum course has been NORMAL. Baby's course has been NORMAL. Baby is feeding by breast. Bleeding thin lochia. Bowel function is normal. Bladder function is normal. Patient is not sexually active. Contraception method is abstinence. Postpartum depression screening: negative.  Tobacco, alcohol and substance abuse history reviewed.  Adult immunizations reviewed including TDAP, rubella and varicella.  The following portions of the patient's history were reviewed and updated as appropriate: allergies, current medications, past family history, past medical history, past social history, past surgical history and problem list.  Review of Systems A comprehensive review of systems was negative.   Objective:    BP 130/85   Pulse 78   Temp 97.5 F (36.4 C) (Oral)   Ht 5\' 5"  (1.651 m)   Wt 172 lb 12.8 oz (78.4 kg)   Breastfeeding? Yes   BMI 28.76 kg/m    PE:  Deferred   >50% of 10 min visit spent on counseling and coordination of care.    Assessment:    3 weeks postpartum.  Doing well.   Contraceptive counseling and advice.  Plan:    1. Contraception: oral progesterone-only contraceptive 2. Micronor Rx 3. Follow up in: 4 weeks or as needed.   Healthy lifestyle practices reviewed

## 2016-06-22 ENCOUNTER — Encounter: Payer: Self-pay | Admitting: Obstetrics

## 2016-06-22 ENCOUNTER — Ambulatory Visit (INDEPENDENT_AMBULATORY_CARE_PROVIDER_SITE_OTHER): Payer: Medicaid Other | Admitting: Obstetrics

## 2016-06-22 DIAGNOSIS — Z30011 Encounter for initial prescription of contraceptive pills: Secondary | ICD-10-CM

## 2016-06-22 DIAGNOSIS — Z3009 Encounter for other general counseling and advice on contraception: Secondary | ICD-10-CM

## 2016-06-22 NOTE — Progress Notes (Signed)
Subjective:     Brittany CamelStacy [name] is a 35 y.o. female who presents for a postpartum visit. She is 4 weeks postpartum following a spontaneous vaginal delivery. I have fully reviewed the prenatal and intrapartum course. The delivery was at 37 gestational weeks. Outcome: spontaneous vaginal delivery. Anesthesia: IV sedation. Postpartum course has been normal. Baby's course has been np. Baby is feeding by breast. Bleeding no bleeding. Bowel function is normal. Bladder function is normal. Patient is not sexually active. Contraception method is oral progesterone-only contraceptive. Postpartum depression screening: negative.  Tobacco, alcohol and substance abuse history reviewed.  Adult immunizations reviewed including TDAP, rubella and varicella.  The following portions of the patient's history were reviewed and updated as appropriate: allergies, current medications, past family history, past medical history, past social history, past surgical history and problem list.  Review of Systems A comprehensive review of systems was negative.   Objective:    BP (!) 135/97   Pulse 75   Wt 173 lb 3.2 oz (78.6 kg)   LMP  (LMP Unknown)   Breastfeeding? Yes   BMI 28.82 kg/m   General:  alert and no distress   Breasts:  inspection negative, no nipple discharge or bleeding, no masses or nodularity palpable  Lungs: clear to auscultation bilaterally  Heart:  regular rate and rhythm, S1, S2 normal, no murmur, click, rub or gallop  Abdomen: soft, non-tender; bowel sounds normal; no masses,  no organomegaly   Vulva:  normal  Vagina: normal vagina  Cervix:  no cervical motion tenderness  Corpus: normal size, contour, position, consistency, mobility, non-tender  Adnexa:  no mass, fullness, tenderness  Rectal Exam: Not performed.          50% of 15 min visit spent on counseling and coordination of care.    Assessment:     Normal postpartum exam. Pap smear not done at today's visit.    Contraceptive Counseling  and Advice.  Breastfeeding and taking Micronor.   Plan:    1. Contraception: oral progesterone-only contraceptive 2. Continue PNV's 3. Follow up in: 6 weeks or as needed.   Healthy lifestyle practices reviewed

## 2016-10-26 ENCOUNTER — Ambulatory Visit (INDEPENDENT_AMBULATORY_CARE_PROVIDER_SITE_OTHER): Payer: BLUE CROSS/BLUE SHIELD | Admitting: Obstetrics

## 2016-10-26 ENCOUNTER — Other Ambulatory Visit (HOSPITAL_COMMUNITY)
Admission: RE | Admit: 2016-10-26 | Discharge: 2016-10-26 | Disposition: A | Discharge status: Home / Self Care | Payer: BLUE CROSS/BLUE SHIELD | Source: Ambulatory Visit | Attending: Obstetrics | Admitting: Obstetrics

## 2016-10-26 ENCOUNTER — Encounter: Payer: Self-pay | Admitting: Obstetrics

## 2016-10-26 VITALS — BP 138/91 | HR 81 | Temp 97.8°F | Wt 184.0 lb

## 2016-10-26 DIAGNOSIS — N898 Other specified noninflammatory disorders of vagina: Secondary | ICD-10-CM

## 2016-10-26 DIAGNOSIS — Z124 Encounter for screening for malignant neoplasm of cervix: Secondary | ICD-10-CM

## 2016-10-26 DIAGNOSIS — Z01411 Encounter for gynecological examination (general) (routine) with abnormal findings: Secondary | ICD-10-CM

## 2016-10-26 DIAGNOSIS — B356 Tinea cruris: Secondary | ICD-10-CM

## 2016-10-26 DIAGNOSIS — B369 Superficial mycosis, unspecified: Secondary | ICD-10-CM

## 2016-10-26 DIAGNOSIS — I10 Essential (primary) hypertension: Secondary | ICD-10-CM

## 2016-10-26 DIAGNOSIS — Z789 Other specified health status: Secondary | ICD-10-CM

## 2016-10-26 DIAGNOSIS — Z3041 Encounter for surveillance of contraceptive pills: Secondary | ICD-10-CM

## 2016-10-26 DIAGNOSIS — Z01419 Encounter for gynecological examination (general) (routine) without abnormal findings: Secondary | ICD-10-CM | POA: Diagnosis not present

## 2016-10-26 MED ORDER — CLOTRIMAZOLE 1 % EX CREA: 1.0000 "application " | TOPICAL_CREAM | Freq: Two times a day (BID) | CUTANEOUS | 4 refills | Status: DC

## 2016-10-26 NOTE — Progress Notes (Signed)
Subjective:        Brittany [name] is a 36 y.o. female here for a routine exam.  Current complaints: Vaginal itching on the outside.    Personal health questionnaire:  Is patient Ashkenazi Jewish, have a family history of breast and/or ovarian cancer: no Is there a family history of uterine cancer diagnosed at age < 53, gastrointestinal cancer, urinary tract cancer, family member who is a Personnel officer syndrome-associated carrier: no Is the patient overweight and hypertensive, family history of diabetes, personal history of gestational diabetes, preeclampsia or PCOS: no Is patient over 2, have PCOS,  family history of premature CHD under age 37, diabetes, smoke, have hypertension or peripheral artery disease:  no At any time, has a partner hit, kicked or otherwise hurt or frightened you?: no Over the past 2 weeks, have you felt down, depressed or hopeless?: no Over the past 2 weeks, have you felt little interest or pleasure in doing things?:no   Gynecologic History No LMP recorded (lmp unknown). Contraception: oral progesterone-only contraceptive Last Pap: unknown. Results were: normal Last mammogram: n/a. Results were: n/a  Obstetric History OB History  Gravida Para Term Preterm AB Living  SAB TAB Ectopic Multiple Live Births  4 1 0 0 3    # Outcome Date GA Lbr Len/2nd Weight Sex Delivery Anes PTL Lv  8 Term 05/03/16 [redacted]w[redacted]d 00:08 / 00:01 4 lb 12.7 oz (2.175 kg) M Vag-Spont None  LIV  7 Preterm 04/06/11 [redacted]w[redacted]d 08:25 / 00:08 5 lb 6.6 oz (2.455 kg) F Vag-Spont None  LIV     Complications: PIH (pregnancy induced hypertension)  6 TAB           5 SAB           4 SAB           3 SAB           2 SAB           1 Term  [redacted]w[redacted]d    Vag-Spont         Past Medical History:  Diagnosis Date  . Gallstone   . Headache(784.0)   . Hypertension   . Pregnancy induced hypertension    after 2nd delivery  . Vaginal Pap smear, abnormal     Past Surgical History:  Procedure  Laterality Date  . DILATION AND CURETTAGE OF UTERUS    . LEEP       Current Outpatient Prescriptions:  .  amLODipine (NORVASC) 10 MG tablet, Take 1 tablet (10 mg total) by mouth daily. (Patient not taking: Reported on 06/22/2016), Disp: 30 tablet, Rfl: 5 .  butalbital-acetaminophen-caffeine (FIORICET, ESGIC) 50-325-40 MG tablet, Take 1-2 tablets by mouth every 6 (six) hours as needed for headache. (Patient not taking: Reported on 05/23/2016), Disp: 20 tablet, Rfl: 0 .  coconut oil OIL, Apply 1 application topically as needed. (Patient not taking: Reported on 05/23/2016), Disp: 500 mL, Rfl: PRN .  docusate sodium (COLACE) 100 MG capsule, Take 1 capsule (100 mg total) by mouth 2 (two) times daily. (Patient not taking: Reported on 05/23/2016), Disp: 90 capsule, Rfl: 2 .  ibuprofen (ADVIL,MOTRIN) 800 MG tablet, Take 1 tablet (800 mg total) by mouth every 8 (eight) hours as needed., Disp: 90 tablet, Rfl: 2 .  metoCLOPramide (REGLAN) 10 MG tablet, Take 1 tablet (10 mg total) by mouth every 6 (six) hours as needed for nausea. (Patient not taking: Reported on 05/23/2016), Disp: 30 tablet,  Rfl: 0 .  metoprolol-hydrochlorothiazide (LOPRESSOR HCT) 100-25 MG tablet, Take 1 tablet by mouth daily. (Patient not taking: Reported on 05/23/2016), Disp: 30 tablet, Rfl: 5 .  norethindrone (MICRONOR,CAMILA,ERRIN) 0.35 MG tablet, Take 1 tablet (0.35 mg total) by mouth daily., Disp: 1 Package, Rfl: 11 .  oxyCODONE 10 MG TABS, Take 1 tablet (10 mg total) by mouth every 4 (four) hours as needed for severe pain (pain scale > 7). (Patient not taking: Reported on 05/23/2016), Disp: 30 tablet, Rfl: 0 .  Prenatal Vit-Fe Fumarate-FA (PRENATAL MULTIVITAMIN) TABS tablet, Take 1 tablet by mouth daily. , Disp: , Rfl:  No Known Allergies  Social History  Substance Use Topics  . Smoking status: Never Smoker  . Smokeless tobacco: Never Used  . Alcohol use No    Family History  Problem Relation Age of Onset  . Asthma Mother   .  Hypertension Mother   . Asthma Maternal Grandmother       Review of Systems  Constitutional: negative for fatigue and weight loss Respiratory: negative for cough and wheezing Cardiovascular: negative for chest pain, fatigue and palpitations Gastrointestinal: negative for abdominal pain and change in bowel habits Musculoskeletal:negative for myalgias Neurological: negative for gait problems and tremors Behavioral/Psych: negative for abusive relationship, depression Endocrine: negative for temperature intolerance    Genitourinary:negative for abnormal menstrual periods, genital lesions, hot flashes, sexual problems and vaginal discharge Integument/breast: negative for breast lump, breast tenderness, nipple discharge and skin lesion(s)    Objective:       BP (!) 138/91   Pulse 81   Temp 97.8 F (36.6 C)   Wt 184 lb (83.5 kg)   LMP  (LMP Unknown)   Breastfeeding? Yes   BMI 30.62 kg/m  General:   alert  Skin:   no rash or abnormalities  Lungs:   clear to auscultation bilaterally  Heart:   regular rate and rhythm, S1, S2 normal, no murmur, click, rub or gallop  Breasts:   normal without suspicious masses, skin or nipple changes or axillary nodes  Abdomen:  normal findings: no organomegaly, soft, non-tender and no hernia  Pelvis:  External genitalia: normal general appearance Urinary system: urethral meatus normal and bladder without fullness, nontender Vaginal: normal without tenderness, induration or masses Cervix: normal appearance Adnexa: normal bimanual exam Uterus: anteverted and non-tender, normal size   Lab Review Urine pregnancy test Labs reviewed yes Radiologic studies reviewed no  50% of 20 min visit spent on counseling and coordination of care.    Assessment:    Healthy female exam.    Jock Itch Contraceptive Surveillance.  Pleased with OCP's.  Breast feeding.   Plan:    Education reviewed: calcium supplements, depression evaluation, low fat, low  cholesterol diet, safe sex/STD prevention, self breast exams and weight bearing exercise. Contraception: oral progesterone-only contraceptive. Follow up in: 1 year.   No orders of the defined types were placed in this encounter.  No orders of the defined types were placed in this encounter.

## 2016-10-26 NOTE — Progress Notes (Signed)
Patient presents for annual exam

## 2016-10-27 LAB — CERVICOVAGINAL ANCILLARY ONLY
Bacterial vaginitis: NEGATIVE
Candida vaginitis: NEGATIVE
Chlamydia: NEGATIVE
NEISSERIA GONORRHEA: NEGATIVE
Trichomonas: NEGATIVE

## 2016-10-31 LAB — CYTOLOGY - PAP
DIAGNOSIS: NEGATIVE
HPV (WINDOPATH): NOT DETECTED

## 2016-11-30 ENCOUNTER — Encounter: Payer: Self-pay | Admitting: Nurse Practitioner

## 2016-11-30 ENCOUNTER — Other Ambulatory Visit (INDEPENDENT_AMBULATORY_CARE_PROVIDER_SITE_OTHER): Payer: BLUE CROSS/BLUE SHIELD

## 2016-11-30 ENCOUNTER — Ambulatory Visit (INDEPENDENT_AMBULATORY_CARE_PROVIDER_SITE_OTHER): Payer: BLUE CROSS/BLUE SHIELD | Admitting: Nurse Practitioner

## 2016-11-30 VITALS — BP 132/80 | HR 83 | Temp 98.4°F | Ht 66.0 in | Wt 183.0 lb

## 2016-11-30 DIAGNOSIS — E0590 Thyrotoxicosis, unspecified without thyrotoxic crisis or storm: Secondary | ICD-10-CM

## 2016-11-30 DIAGNOSIS — Z0000 Encounter for general adult medical examination without abnormal findings: Secondary | ICD-10-CM

## 2016-11-30 DIAGNOSIS — D649 Anemia, unspecified: Secondary | ICD-10-CM | POA: Insufficient documentation

## 2016-11-30 DIAGNOSIS — Z13220 Encounter for screening for lipoid disorders: Secondary | ICD-10-CM | POA: Diagnosis not present

## 2016-11-30 DIAGNOSIS — Z136 Encounter for screening for cardiovascular disorders: Secondary | ICD-10-CM | POA: Diagnosis not present

## 2016-11-30 DIAGNOSIS — R011 Cardiac murmur, unspecified: Secondary | ICD-10-CM | POA: Insufficient documentation

## 2016-11-30 HISTORY — DX: Cardiac murmur, unspecified: R01.1

## 2016-11-30 HISTORY — DX: Anemia, unspecified: D64.9

## 2016-11-30 LAB — COMPREHENSIVE METABOLIC PANEL
ALT: 13 U/L (ref 0–35)
AST: 20 U/L (ref 0–37)
Albumin: 4.5 g/dL (ref 3.5–5.2)
Alkaline Phosphatase: 53 U/L (ref 39–117)
BILIRUBIN TOTAL: 0.3 mg/dL (ref 0.2–1.2)
BUN: 8 mg/dL (ref 6–23)
CALCIUM: 9.8 mg/dL (ref 8.4–10.5)
CHLORIDE: 103 meq/L (ref 96–112)
CO2: 29 meq/L (ref 19–32)
CREATININE: 0.83 mg/dL (ref 0.40–1.20)
GFR: 101.36 mL/min (ref >60.00)
Glucose, Bld: 92 mg/dL (ref 70–99)
Potassium: 3.8 mEq/L (ref 3.5–5.1)
SODIUM: 139 meq/L (ref 135–145)
Total Protein: 8.2 g/dL (ref 6.0–8.3)

## 2016-11-30 LAB — CBC
HCT: 38.2 % (ref 36.0–46.0)
Hemoglobin: 12.7 g/dL (ref 12.0–15.0)
MCHC: 33.3 g/dL (ref 30.0–36.0)
MCV: 83.5 fl (ref 78.0–100.0)
PLATELETS: 269 10*3/uL (ref 150.0–400.0)
RBC: 4.57 Mil/uL (ref 3.87–5.11)
RDW: 13.3 % (ref 11.5–15.5)
WBC: 7.5 10*3/uL (ref 4.0–10.5)

## 2016-11-30 LAB — LIPID PANEL
CHOL/HDL RATIO: 2
Cholesterol: 136 mg/dL (ref 0–200)
HDL: 67.5 mg/dL (ref >39.00)
LDL CALC: 57 mg/dL (ref 0–99)
NONHDL: 68.13
TRIGLYCERIDES: 54 mg/dL (ref 0.0–149.0)
VLDL: 10.8 mg/dL (ref 0.0–40.0)

## 2016-11-30 LAB — T3, FREE: T3, Free: 3.8 pg/mL (ref 2.3–4.2)

## 2016-11-30 LAB — TSH: TSH: 0.9 u[IU]/mL (ref 0.35–4.50)

## 2016-11-30 LAB — T4, FREE: FREE T4: 0.8 ng/dL (ref 0.60–1.60)

## 2016-11-30 NOTE — Patient Instructions (Signed)
Go to basement for blood draw You will be contact with lab results.  Please have dental exam done ASAP.  Health Maintenance, Female Adopting a healthy lifestyle and getting preventive care can go a long way to promote health and wellness. Talk with your health care provider about what schedule of regular examinations is right for you. This is a good chance for you to check in with your provider about disease prevention and staying healthy. In between checkups, there are plenty of things you can do on your own. Experts have done a lot of research about which lifestyle changes and preventive measures are most likely to keep you healthy. Ask your health care provider for more information. Weight and diet Eat a healthy diet  Be sure to include plenty of vegetables, fruits, low-fat dairy products, and lean protein.  Do not eat a lot of foods high in solid fats, added sugars, or salt.  Get regular exercise. This is one of the most important things you can do for your health.  Most adults should exercise for at least 150 minutes each week. The exercise should increase your heart rate and make you sweat (moderate-intensity exercise).  Most adults should also do strengthening exercises at least twice a week. This is in addition to the moderate-intensity exercise. Maintain a healthy weight  Body mass index (BMI) is a measurement that can be used to identify possible weight problems. It estimates body fat based on height and weight. Your health care provider can help determine your BMI and help you achieve or maintain a healthy weight.  For females 22 years of age and older:  A BMI below 18.5 is considered underweight.  A BMI of 18.5 to 24.9 is normal.  A BMI of 25 to 29.9 is considered overweight.  A BMI of 30 and above is considered obese. Watch levels of cholesterol and blood lipids  You should start having your blood tested for lipids and cholesterol at 34 years of age, then have this  test every 5 years.  You may need to have your cholesterol levels checked more often if:  Your lipid or cholesterol levels are high.  You are older than 34 years of age.  You are at high risk for heart disease. Cancer screening Lung Cancer  Lung cancer screening is recommended for adults 86-19 years old who are at high risk for lung cancer because of a history of smoking.  A yearly low-dose CT scan of the lungs is recommended for people who:  Currently smoke.  Have quit within the past 15 years.  Have at least a 30-pack-year history of smoking. A pack year is smoking an average of one pack of cigarettes a day for 1 year.  Yearly screening should continue until it has been 15 years since you quit.  Yearly screening should stop if you develop a health problem that would prevent you from having lung cancer treatment. Breast Cancer  Practice breast self-awareness. This means understanding how your breasts normally appear and feel.  It also means doing regular breast self-exams. Let your health care provider know about any changes, no matter how small.  If you are in your 20s or 30s, you should have a clinical breast exam (CBE) by a health care provider every 1-3 years as part of a regular health exam.  If you are 104 or older, have a CBE every year. Also consider having a breast X-ray (mammogram) every year.  If you have a family history of breast  cancer, talk to your health care provider about genetic screening.  If you are at high risk for breast cancer, talk to your health care provider about having an MRI and a mammogram every year.  Breast cancer gene (BRCA) assessment is recommended for women who have family members with BRCA-related cancers. BRCA-related cancers include:  Breast.  Ovarian.  Tubal.  Peritoneal cancers.  Results of the assessment will determine the need for genetic counseling and BRCA1 and BRCA2 testing. Cervical Cancer  Your health care provider  may recommend that you be screened regularly for cancer of the pelvic organs (ovaries, uterus, and vagina). This screening involves a pelvic examination, including checking for microscopic changes to the surface of your cervix (Pap test). You may be encouraged to have this screening done every 3 years, beginning at age 50.  For women ages 64-65, health care providers may recommend pelvic exams and Pap testing every 3 years, or they may recommend the Pap and pelvic exam, combined with testing for human papilloma virus (HPV), every 5 years. Some types of HPV increase your risk of cervical cancer. Testing for HPV may also be done on women of any age with unclear Pap test results.  Other health care providers may not recommend any screening for nonpregnant women who are considered low risk for pelvic cancer and who do not have symptoms. Ask your health care provider if a screening pelvic exam is right for you.  If you have had past treatment for cervical cancer or a condition that could lead to cancer, you need Pap tests and screening for cancer for at least 20 years after your treatment. If Pap tests have been discontinued, your risk factors (such as having a new sexual partner) need to be reassessed to determine if screening should resume. Some women have medical problems that increase the chance of getting cervical cancer. In these cases, your health care provider may recommend more frequent screening and Pap tests. Colorectal Cancer  This type of cancer can be detected and often prevented.  Routine colorectal cancer screening usually begins at 34 years of age and continues through 34 years of age.  Your health care provider may recommend screening at an earlier age if you have risk factors for colon cancer.  Your health care provider may also recommend using home test kits to check for hidden blood in the stool.  A small camera at the end of a tube can be used to examine your colon directly  (sigmoidoscopy or colonoscopy). This is done to check for the earliest forms of colorectal cancer.  Routine screening usually begins at age 43.  Direct examination of the colon should be repeated every 5-10 years through 34 years of age. However, you may need to be screened more often if early forms of precancerous polyps or small growths are found. Skin Cancer  Check your skin from head to toe regularly.  Tell your health care provider about any new moles or changes in moles, especially if there is a change in a mole's shape or color.  Also tell your health care provider if you have a mole that is larger than the size of a pencil eraser.  Always use sunscreen. Apply sunscreen liberally and repeatedly throughout the day.  Protect yourself by wearing long sleeves, pants, a wide-brimmed hat, and sunglasses whenever you are outside. Heart disease, diabetes, and high blood pressure  High blood pressure causes heart disease and increases the risk of stroke. High blood pressure is more likely  to develop in:  People who have blood pressure in the high end of the normal range (130-139/85-89 mm Hg).  People who are overweight or obese.  People who are African American.  If you are 39-70 years of age, have your blood pressure checked every 3-5 years. If you are 65 years of age or older, have your blood pressure checked every year. You should have your blood pressure measured twice-once when you are at a hospital or clinic, and once when you are not at a hospital or clinic. Record the average of the two measurements. To check your blood pressure when you are not at a hospital or clinic, you can use:  An automated blood pressure machine at a pharmacy.  A home blood pressure monitor.  If you are between 44 years and 40 years old, ask your health care provider if you should take aspirin to prevent strokes.  Have regular diabetes screenings. This involves taking a blood sample to check your  fasting blood sugar level.  If you are at a normal weight and have a low risk for diabetes, have this test once every three years after 34 years of age.  If you are overweight and have a high risk for diabetes, consider being tested at a younger age or more often. Preventing infection Hepatitis B  If you have a higher risk for hepatitis B, you should be screened for this virus. You are considered at high risk for hepatitis B if:  You were born in a country where hepatitis B is common. Ask your health care provider which countries are considered high risk.  Your parents were born in a high-risk country, and you have not been immunized against hepatitis B (hepatitis B vaccine).  You have HIV or AIDS.  You use needles to inject street drugs.  You live with someone who has hepatitis B.  You have had sex with someone who has hepatitis B.  You get hemodialysis treatment.  You take certain medicines for conditions, including cancer, organ transplantation, and autoimmune conditions. Hepatitis C  Blood testing is recommended for:  Everyone born from 40 through 1965.  Anyone with known risk factors for hepatitis C. Sexually transmitted infections (STIs)  You should be screened for sexually transmitted infections (STIs) including gonorrhea and chlamydia if:  You are sexually active and are younger than 34 years of age.  You are older than 34 years of age and your health care provider tells you that you are at risk for this type of infection.  Your sexual activity has changed since you were last screened and you are at an increased risk for chlamydia or gonorrhea. Ask your health care provider if you are at risk.  If you do not have HIV, but are at risk, it may be recommended that you take a prescription medicine daily to prevent HIV infection. This is called pre-exposure prophylaxis (PrEP). You are considered at risk if:  You are sexually active and do not regularly use condoms or  know the HIV status of your partner(s).  You take drugs by injection.  You are sexually active with a partner who has HIV. Talk with your health care provider about whether you are at high risk of being infected with HIV. If you choose to begin PrEP, you should first be tested for HIV. You should then be tested every 3 months for as long as you are taking PrEP. Pregnancy  If you are premenopausal and you may become pregnant, ask your  health care provider about preconception counseling.  If you may become pregnant, take 400 to 800 micrograms (mcg) of folic acid every day.  If you want to prevent pregnancy, talk to your health care provider about birth control (contraception). Osteoporosis and menopause  Osteoporosis is a disease in which the bones lose minerals and strength with aging. This can result in serious bone fractures. Your risk for osteoporosis can be identified using a bone density scan.  If you are 84 years of age or older, or if you are at risk for osteoporosis and fractures, ask your health care provider if you should be screened.  Ask your health care provider whether you should take a calcium or vitamin D supplement to lower your risk for osteoporosis.  Menopause may have certain physical symptoms and risks.  Hormone replacement therapy may reduce some of these symptoms and risks. Talk to your health care provider about whether hormone replacement therapy is right for you. Follow these instructions at home:  Schedule regular health, dental, and eye exams.  Stay current with your immunizations.  Do not use any tobacco products including cigarettes, chewing tobacco, or electronic cigarettes.  If you are pregnant, do not drink alcohol.  If you are breastfeeding, limit how much and how often you drink alcohol.  Limit alcohol intake to no more than 1 drink per day for nonpregnant women. One drink equals 12 ounces of beer, 5 ounces of wine, or 1 ounces of hard  liquor.  Do not use street drugs.  Do not share needles.  Ask your health care provider for help if you need support or information about quitting drugs.  Tell your health care provider if you often feel depressed.  Tell your health care provider if you have ever been abused or do not feel safe at home. This information is not intended to replace advice given to you by your health care provider. Make sure you discuss any questions you have with your health care provider. Document Released: 01/17/2011 Document Revised: 12/10/2015 Document Reviewed: 04/07/2015 Elsevier Interactive Patient Education  2017 [employer].

## 2016-11-30 NOTE — Progress Notes (Signed)
Subjective:    Patient ID: Brittany [name], female    DOB: [birth year], 36 y.o.   MRN: [id number]  Patient presents today for complete physical (new patient) HPI  Denies any Acute complains.  No previous pcp.  Headache: Chronic, prn use of ibuprofen with relief.  Currently Breastfeeding a 6months old.  Immunizations: (TDAP, Hep C screen, Pneumovax, Influenza, zoster)  Health Maintenance  Topic Date Due  . Flu Shot  02/15/2017  . Pap Smear  10/27/2019  . Tetanus Vaccine  03/02/2026  . HIV Screening  Completed   Diet:regular.  Weight:  Wt Readings from Last 3 Encounters:  11/30/16 183 lb (83 kg)  10/26/16 184 lb (83.5 kg)  06/22/16 173 lb 3.2 oz (78.6 kg)   Exercise:none.  Fall Risk: Fall Risk  11/30/2016 12/16/2015 12/09/2015 11/20/2015 11/18/2015  Falls in the past year? No No No No No   Home Safety:home with husband and 3children.  Depression/Suicide: Depression screen [facility] 2/9 11/30/2016 12/16/2015 12/09/2015  Decreased Interest 0 0 0  Down, Depressed, Hopeless 0 0 0  PHQ - 2 Score 0 0 0   No flowsheet data found.  Pap Smear (every 61yrs for >21-29 without HPV, every 57yrs for >30-82yrs with HPV):up to date, done by GYN.  Vision:needed Dental:needed Advanced Directive: Advanced Directives 05/03/2016  Does Patient Have a Medical Advance Directive? No  Would patient like information on creating a medical advance directive? No - patient declined information  Pre-existing out of facility DNR order (yellow form or pink MOST form) -   Sexual History (birth control, marital status, STD):married, works as Production designer, theatre/television/film at [employer], sexually active, current use of OCP.  Medications and allergies reviewed with patient and updated if appropriate.  Patient Active Problem List   Diagnosis Date Noted  . Murmur, cardiac 11/30/2016  . Anemia 11/30/2016  . Migraine 03/18/2016  . Subclinical hyperthyroidism 12/09/2015    Current Outpatient Prescriptions on File Prior to Visit    Medication Sig Dispense Refill  . Prenatal Vit-Fe Fumarate-FA (PRENATAL MULTIVITAMIN) TABS tablet Take 1 tablet by mouth daily.      No current facility-administered medications on file prior to visit.     Past Medical History:  Diagnosis Date  . Gallstone   . Headache(784.0)   . Hypertension   . Pregnancy induced hypertension    after 2nd delivery  . Vaginal Pap smear, abnormal     Past Surgical History:  Procedure Laterality Date  . DILATION AND CURETTAGE OF UTERUS    . LEEP      Social History   Social History  . Marital status: Married    Spouse name: N/A  . Number of children: N/A  . Years of education: N/A   Social History Main Topics  . Smoking status: Never Smoker  . Smokeless tobacco: Never Used  . Alcohol use No  . Drug use: No  . Sexual activity: Yes    Birth control/ protection: Pill   Other Topics Concern  . None   Social History Narrative  . None    Family History  Problem Relation Age of Onset  . Asthma Mother   . Hypertension Mother   . Asthma Maternal Grandmother   . Hypertension Maternal Grandmother         Review of Systems  Constitutional: Negative for fever, malaise/fatigue and weight loss.  HENT: Negative for congestion and sore throat.   Eyes:       Negative for visual changes  Respiratory: Negative for cough  and shortness of breath.   Cardiovascular: Negative for chest pain, palpitations and leg swelling.  Gastrointestinal: Negative for blood in stool, constipation, diarrhea and heartburn.  Genitourinary: Negative for dysuria, frequency and urgency.  Musculoskeletal: Negative for falls, joint pain and myalgias.  Skin: Negative for rash.  Neurological: Positive for headaches. Negative for dizziness and sensory change.  Endo/Heme/Allergies: Does not bruise/bleed easily.  Psychiatric/Behavioral: Negative for depression, substance abuse and suicidal ideas. The patient is not nervous/anxious.     Objective:   Vitals:    11/30/16 0919  BP: 132/80  Pulse: 83  Temp: 98.4 F (36.9 C)    Body mass index is 29.54 kg/m.   Physical Examination:  Physical Exam  Constitutional: She is oriented to person, place, and time and well-developed, well-nourished, and in no distress. No distress.  HENT:  Right Ear: External ear normal.  Left Ear: External ear normal.  Nose: Nose normal.  Mouth/Throat: Oropharynx is clear and moist. No oropharyngeal exudate.  Eyes: Conjunctivae and EOM are normal. Pupils are equal, round, and reactive to light. No scleral icterus.  Neck: Normal range of motion. Neck supple. No thyromegaly present.  Cardiovascular: Normal rate, regular rhythm, S1 normal, S2 normal and intact distal pulses.   Murmur heard.  Systolic murmur is present with a grade of 2/6  Pulmonary/Chest: Effort normal and breath sounds normal. She exhibits no tenderness.  Breast exam deferred to GYN by patient  Abdominal: Soft. Bowel sounds are normal. She exhibits no distension. There is no tenderness.  Genitourinary:  Genitourinary Comments: Deferred to GYN by patient  Musculoskeletal: Normal range of motion. She exhibits no edema or tenderness.  Lymphadenopathy:    She has no cervical adenopathy.  Neurological: She is alert and oriented to person, place, and time. Gait normal.  Skin: Skin is warm and dry.  Psychiatric: Affect and judgment normal.    ASSESSMENT and PLAN:  Brittany [name] was seen today for establish care.  Diagnoses and all orders for this visit:  Preventative health care -     Comprehensive metabolic panel; Future -     Lipid panel; Future -     CBC; Future  Subclinical hyperthyroidism -     TSH; Future -     T4, free; Future -     T3, free; Future  Encounter for lipid screening for cardiovascular disease -     Lipid panel; Future   No problem-specific Assessment & Plan notes found for this encounter.     Follow up: Return if symptoms worsen or fail to improve.  Brittany Pennaharlotte Meldrick Buttery,  NP

## 2017-03-08 ENCOUNTER — Encounter (HOSPITAL_COMMUNITY): Payer: Self-pay

## 2017-03-08 ENCOUNTER — Emergency Department (HOSPITAL_COMMUNITY)
Admission: EM | Admit: 2017-03-08 | Discharge: 2017-03-08 | Disposition: A | Discharge status: Home / Self Care | Payer: BLUE CROSS/BLUE SHIELD | Attending: Emergency Medicine | Admitting: Emergency Medicine

## 2017-03-08 ENCOUNTER — Emergency Department (HOSPITAL_COMMUNITY): Payer: BLUE CROSS/BLUE SHIELD

## 2017-03-08 DIAGNOSIS — D649 Anemia, unspecified: Secondary | ICD-10-CM | POA: Insufficient documentation

## 2017-03-08 DIAGNOSIS — Z79899 Other long term (current) drug therapy: Secondary | ICD-10-CM | POA: Insufficient documentation

## 2017-03-08 DIAGNOSIS — M67442 Ganglion, left hand: Secondary | ICD-10-CM | POA: Diagnosis not present

## 2017-03-08 DIAGNOSIS — I10 Essential (primary) hypertension: Secondary | ICD-10-CM | POA: Diagnosis not present

## 2017-03-08 DIAGNOSIS — M79642 Pain in left hand: Secondary | ICD-10-CM | POA: Diagnosis present

## 2017-03-08 DIAGNOSIS — M6740 Ganglion, unspecified site: Secondary | ICD-10-CM

## 2017-03-08 MED ORDER — IBUPROFEN 400 MG PO TABS
600.0000 mg | ORAL_TABLET | Freq: Once | ORAL | Status: AC
  Administered 2017-03-08: 600 mg via ORAL

## 2017-03-08 NOTE — Discharge Instructions (Signed)
You can safely take ibuprofen or Tylenol for discomfort, even placed in a wrist splint to help decrease irritation to the nerve.  He been given a referral to Dr. Izora Ribas.  He is a hand specialist.  Please call make an appointment for further evaluation and treatment

## 2017-03-08 NOTE — ED Provider Notes (Signed)
[facility]-EMERGENCY DEPT Provider Note   CSN: [id number] Arrival date & time: 03/08/17  0144     History   Chief Complaint Chief Complaint  Patient presents with  . Hand Pain    HPI Brittany [name] is a 36 y.o. female.  This a 36 year old female who presents with left hand pain.  She noticed a nodule on the dorsal aspect of her left hand proximal to the fourth metacarpal that is painful to touch.  She states she only noticed this tonight.  Denies any known trauma.  Has not taken any medication prior to arrival for discomfort      Past Medical History:  Diagnosis Date  . Gallstone   . Headache(784.0)   . Hypertension   . Pregnancy induced hypertension    after 2nd delivery  . Vaginal Pap smear, abnormal     Patient Active Problem List   Diagnosis Date Noted  . Murmur, cardiac 11/30/2016  . Anemia 11/30/2016  . Migraine 03/18/2016  . Subclinical hyperthyroidism 12/09/2015    Past Surgical History:  Procedure Laterality Date  . DILATION AND CURETTAGE OF UTERUS    . LEEP      OB History    Gravida Para Term Preterm AB Living   8 3 2 1 5 3    SAB TAB Ectopic Multiple Live Births   4 1 0 0 3       Home Medications    Prior to Admission medications   Medication Sig Start Date End Date Taking? Authorizing Provider  NON FORMULARY Take by mouth daily. Mylan--birth control    [provider]  Prenatal Vit-Fe Fumarate-FA (PRENATAL MULTIVITAMIN) TABS tablet Take 1 tablet by mouth daily.     [provider]    Family History Family History  Problem Relation Age of Onset  . Asthma Mother   . Hypertension Mother   . Asthma Maternal Grandmother   . Hypertension Maternal Grandmother     Social History Social History  Substance Use Topics  . Smoking status: Never Smoker  . Smokeless tobacco: Never Used  . Alcohol use No     Allergies   Patient has no known allergies.   Review of Systems Review of Systems  Musculoskeletal: Positive for  arthralgias. Negative for joint swelling.  Neurological: Negative for weakness and numbness.  All other systems reviewed and are negative.    Physical Exam Updated Vital Signs BP (!) 145/94 (BP Location: Right Arm)   Pulse 80   Temp 98 F (36.7 C) (Oral)   Resp 16   Ht 5\' 5"  (1.651 m)   Wt 81.6 kg (180 lb)   LMP 03/08/2017 (Exact Date)   SpO2 100%   Breastfeeding? Yes   BMI 29.95 kg/m   Physical Exam  Constitutional: She appears well-developed and well-nourished.  HENT:  Head: Normocephalic.  Eyes: Pupils are equal, round, and reactive to light.  Neck: Normal range of motion.  Cardiovascular: Normal rate.   Pulmonary/Chest: Effort normal.  Musculoskeletal: She exhibits tenderness. She exhibits no edema or deformity.       Hands: Neurological: She is alert.  Skin: Skin is warm.  Psychiatric: She has a normal mood and affect.  Nursing note and vitals reviewed.    ED Treatments / Results  Labs (all labs ordered are listed, but only abnormal results are displayed) Labs Reviewed - No data to display  EKG  EKG Interpretation None       Radiology Dg Hand Complete Left  Result Date:  03/08/2017 CLINICAL DATA:  Left hand pain for 2 days. EXAM: LEFT HAND - COMPLETE 3+ VIEW COMPARISON:  None. FINDINGS: There is no evidence of fracture or dislocation. There is no evidence of arthropathy or other focal bone abnormality. Soft tissues are unremarkable. IMPRESSION: Negative radiographs of the left hand. Electronically Signed   By: Rubye Oaks M.D.   On: 03/08/2017 02:17    Procedures Procedures (including critical care time)  Medications Ordered in ED Medications  ibuprofen (ADVIL,MOTRIN) tablet 600 mg (not administered)     Initial Impression / Assessment and Plan / ED Course  I have reviewed the triage vital signs and the nursing notes.  Pertinent labs & imaging results that were available during my care of the patient were reviewed by me and considered in my  medical decision making (see chart for details).     Exam is consistent with ganglion cyst.  Patient will be given ibuprofen and placed in a cock-up splint in the emergency department.  She'll be given a referral to hand surgery    Final Clinical Impressions(s) / ED Diagnoses   Final diagnoses:  Ganglion cyst    New Prescriptions New Prescriptions   No medications on file     Earley Favor, NP 03/08/17 2536    Shon Baton, MD 03/08/17 [phone]

## 2017-03-08 NOTE — ED Notes (Signed)
Ortho paged & to bring & apply splint

## 2017-03-08 NOTE — ED Notes (Signed)
Ice pack applied to left hand.

## 2017-03-08 NOTE — Progress Notes (Signed)
Orthopedic Tech Progress Note Patient Details:  Brittany [name] May 29, 1981 [id number]  Ortho Devices Type of Ortho Device: Velcro wrist splint Ortho Device/Splint Location: lue Ortho Device/Splint Interventions: Ordered, Application, Adjustment   Trinna Post 03/08/2017, 2:48 AM

## 2017-03-08 NOTE — ED Notes (Signed)
Patient transported to X-ray 

## 2017-03-08 NOTE — ED Notes (Signed)
Ortho tech at bedside 

## 2017-03-08 NOTE — ED Notes (Signed)
NP at bedside.

## 2017-03-08 NOTE — ED Triage Notes (Signed)
Pt endorses left pain that began today. Pt has a knot on left hand. Able to make fist and cms intact. VSS

## 2017-05-02 ENCOUNTER — Other Ambulatory Visit: Payer: Self-pay | Admitting: Certified Nurse Midwife

## 2017-07-18 NOTE — L&D Delivery Note (Signed)
Patient is a 37 y.o. now [id number] s/p NSVD at 588w2d, who was admitted for IOL for cHTN with SI PEC.  She progressed with/without augmentation to complete and pushed through 1 UC to deliver.  Cord clamping delayed by several minutes then clamped by CNM and cut by CNM.  Placenta intact and spontaneous, bleeding minimal.  No laceration.  Mom and baby stable prior to transfer to postpartum. She plans on breastfeeding. She requests Nexplanon (?) for birth control.  Delivery Note At 4:09 AM a viable female was delivered via Vaginal, Spontaneous (Presentation: ).  APGAR: 8, 9; weight pending.   Placenta status: spontaneous; intact via Tomasa BlaseSchultz.  Cord: 3VC  with no complications.  Cord pH: n/a  Anesthesia: epidural Episiotomy: None Lacerations: None Suture Repair: n/a Est. Blood Loss (mL): 210  Mom to postpartum.  Baby to Couplet care / Skin to Skin .   Raelyn Moraolitta Amandeep Hogston, MSN, CNM 06/13/18, 4:42 AM

## 2017-07-24 ENCOUNTER — Telehealth: Payer: Self-pay | Admitting: *Deleted

## 2017-07-24 ENCOUNTER — Other Ambulatory Visit: Payer: Self-pay | Admitting: Obstetrics

## 2017-07-24 DIAGNOSIS — Z3041 Encounter for surveillance of contraceptive pills: Secondary | ICD-10-CM

## 2017-07-24 MED ORDER — LEVONORGESTREL-ETHINYL ESTRAD 0.15-30 MG-MCG PO TABS: 1.0000 | ORAL_TABLET | Freq: Every day | ORAL | 11 refills | Status: DC

## 2017-07-24 NOTE — Telephone Encounter (Signed)
Nordette 28 Rx Stop progestin-only pills

## 2017-07-24 NOTE — Telephone Encounter (Signed)
Pt called in to schedule Annual but it was too early for Annual, also stated she is no longer breastfeeding and her and Dr. Clearance CootsHarper discussed her using a different type when birth control ended and wants to know if that can be sent to her pharmacy. Please advise.Marland Kitchen..Marland Kitchen

## 2017-07-24 NOTE — Telephone Encounter (Signed)
Pt informed

## 2017-07-26 ENCOUNTER — Other Ambulatory Visit: Payer: Self-pay | Admitting: Obstetrics

## 2017-07-26 ENCOUNTER — Telehealth: Payer: Self-pay

## 2017-07-26 DIAGNOSIS — Z30011 Encounter for initial prescription of contraceptive pills: Secondary | ICD-10-CM

## 2017-07-26 MED ORDER — NORETHIN ACE-ETH ESTRAD-FE 1-20 MG-MCG(24) PO TABS: 1.0000 | ORAL_TABLET | Freq: Every day | ORAL | 11 refills | Status: DC

## 2017-07-26 NOTE — Telephone Encounter (Signed)
Loestrin 24 Fe prescribed. Stop Nordette 28

## 2017-07-26 NOTE — Telephone Encounter (Signed)
Pt called stating that the new birth control pill made her sick. She states that she can not continue taking them. She would like to know if there is another pill that she can be prescribed that will not make her sick.

## 2017-07-26 NOTE — Telephone Encounter (Signed)
Pt informed

## 2017-08-05 ENCOUNTER — Encounter (HOSPITAL_COMMUNITY): Payer: Self-pay | Admitting: Emergency Medicine

## 2017-08-05 ENCOUNTER — Other Ambulatory Visit: Payer: Self-pay

## 2017-08-05 ENCOUNTER — Emergency Department (HOSPITAL_COMMUNITY)
Admission: EM | Admit: 2017-08-05 | Discharge: 2017-08-05 | Disposition: A | Discharge status: Home / Self Care | Payer: BLUE CROSS/BLUE SHIELD | Attending: Emergency Medicine | Admitting: Emergency Medicine

## 2017-08-05 DIAGNOSIS — I10 Essential (primary) hypertension: Secondary | ICD-10-CM | POA: Insufficient documentation

## 2017-08-05 DIAGNOSIS — R0981 Nasal congestion: Secondary | ICD-10-CM | POA: Diagnosis present

## 2017-08-05 DIAGNOSIS — J111 Influenza due to unidentified influenza virus with other respiratory manifestations: Secondary | ICD-10-CM | POA: Insufficient documentation

## 2017-08-05 DIAGNOSIS — Z79899 Other long term (current) drug therapy: Secondary | ICD-10-CM | POA: Diagnosis not present

## 2017-08-05 DIAGNOSIS — R6889 Other general symptoms and signs: Secondary | ICD-10-CM

## 2017-08-05 MED ORDER — DM-GUAIFENESIN ER 30-600 MG PO TB12
1.0000 | ORAL_TABLET | Freq: Two times a day (BID) | ORAL | Status: DC
  Administered 2017-08-05: 1 via ORAL
  Filled 2017-08-05: qty 1

## 2017-08-05 MED ORDER — IBUPROFEN 600 MG PO TABS: 600.0000 mg | ORAL_TABLET | Freq: Four times a day (QID) | ORAL | 0 refills | Status: DC | PRN

## 2017-08-05 MED ORDER — ACETAMINOPHEN 325 MG PO TABS: 650.0000 mg | ORAL_TABLET | Freq: Four times a day (QID) | ORAL | 0 refills | Status: DC | PRN

## 2017-08-05 MED ORDER — IBUPROFEN 400 MG PO TABS
600.0000 mg | ORAL_TABLET | Freq: Once | ORAL | Status: AC
  Administered 2017-08-05: 07:00:00 600 mg via ORAL
  Filled 2017-08-05: qty 1

## 2017-08-05 MED ORDER — DM-GUAIFENESIN ER 30-600 MG PO TB12: 1.0000 | ORAL_TABLET | Freq: Two times a day (BID) | ORAL | 0 refills | Status: DC

## 2017-08-05 NOTE — ED Provider Notes (Signed)
[facility] EMERGENCY DEPARTMENT Provider Note   CSN: [id number] Arrival date & time: 08/05/17  0010     History   Chief Complaint Chief Complaint  Patient presents with  . Influenza    HPI Brittany [name] is a 37 y.o. female.  HPI  37 year old female comes in with flulike symptoms.  Patient reports that for the past 2 days she has been having body aches, nasal congestion, sore throat with postnasal drip, cough, headaches, and back pain.  Patient denies any sick contacts, and she did get a flu shot.  Patient has no associated emesis or diarrhea.  Patient has taken TheraFlu with minimal relief. Pt doesn't think she is pregnant.  Past Medical History:  Diagnosis Date  . Gallstone   . Headache(784.0)   . Hypertension   . Pregnancy induced hypertension    after 2nd delivery  . Vaginal Pap smear, abnormal     Patient Active Problem List   Diagnosis Date Noted  . Murmur, cardiac 11/30/2016  . Anemia 11/30/2016  . Migraine 03/18/2016  . Subclinical hyperthyroidism 12/09/2015    Past Surgical History:  Procedure Laterality Date  . DILATION AND CURETTAGE OF UTERUS    . LEEP      OB History    Gravida Para Term Preterm AB Living   8 3 2 1 5 3    SAB TAB Ectopic Multiple Live Births   4 1 0 0 3       Home Medications    Prior to Admission medications   Medication Sig Start Date End Date Taking? Authorizing Provider  acetaminophen (TYLENOL) 325 MG tablet Take 2 tablets (650 mg total) by mouth every 6 (six) hours as needed. 08/05/17   Derwood Kaplan, MD  dextromethorphan-guaiFENesin (MUCINEX DM) 30-600 MG 12hr tablet Take 1 tablet by mouth 2 (two) times daily. 08/05/17   Derwood Kaplan, MD  ibuprofen (ADVIL,MOTRIN) 600 MG tablet Take 1 tablet (600 mg total) by mouth every 6 (six) hours as needed. 08/05/17   Derwood Kaplan, MD  NON FORMULARY Take by mouth daily. Mylan--birth control    [provider]  Norethindrone Acetate-Ethinyl  Estrad-FE (LOESTRIN 24 FE) 1-20 MG-MCG(24) tablet Take 1 tablet by mouth daily. 07/26/17   Brock Bad, MD  Prenatal Vit-Fe Fumarate-FA (PRENATAL MULTIVITAMIN) TABS tablet Take 1 tablet by mouth daily.     [provider]    Family History Family History  Problem Relation Age of Onset  . Asthma Mother   . Hypertension Mother   . Asthma Maternal Grandmother   . Hypertension Maternal Grandmother     Social History Social History   Tobacco Use  . Smoking status: Never Smoker  . Smokeless tobacco: Never Used  Substance Use Topics  . Alcohol use: No    Alcohol/week: 0.0 oz  . Drug use: No     Allergies   Patient has no known allergies.   Review of Systems Review of Systems  Constitutional: Positive for activity change.  HENT: Positive for congestion, postnasal drip, rhinorrhea, sinus pressure, sinus pain and sore throat.   Respiratory: Positive for cough.   Allergic/Immunologic: Negative for immunocompromised state.  All other systems reviewed and are negative.    Physical Exam Updated Vital Signs BP (!) 135/94 (BP Location: Left Arm)   Pulse 77   Temp 99 F (37.2 C) (Oral)   Resp 16   Ht 5\' 5"  (1.651 m)   Wt 81.6 kg (180 lb)   SpO2 100%  BMI 29.95 kg/m   Physical Exam  Constitutional: She is oriented to person, place, and time. She appears well-developed.  HENT:  Head: Normocephalic and atraumatic.  Eyes: EOM are normal.  Neck: Normal range of motion. Neck supple.  Cardiovascular: Normal rate.  Pulmonary/Chest: Effort normal. No stridor. No respiratory distress. She has no wheezes.  Abdominal: Bowel sounds are normal.  Lymphadenopathy:    She has cervical adenopathy.  Neurological: She is alert and oriented to person, place, and time.  Skin: Skin is warm and dry.  Nursing note and vitals reviewed.    ED Treatments / Results  Labs (all labs ordered are listed, but only abnormal results are displayed) Labs Reviewed - No data to  display  EKG  EKG Interpretation None       Radiology No results found.  Procedures Procedures (including critical care time)  Medications Ordered in ED Medications  ibuprofen (ADVIL,MOTRIN) tablet 600 mg (not administered)  dextromethorphan-guaiFENesin (MUCINEX DM) 30-600 MG per 12 hr tablet 1 tablet (not administered)     Initial Impression / Assessment and Plan / ED Course  I have reviewed the triage vital signs and the nursing notes.  Pertinent labs & imaging results that were available during my care of the patient were reviewed by me and considered in my medical decision making (see chart for details).    37 year old female comes in with what appears to be flulike illness.  Patient did get a flu shot which is reassuring.  Patient does not demonstrate any signs of decompensation, and we will treat her symptomatically.  Strict ER return precautions have been discussed.   Final Clinical Impressions(s) / ED Diagnoses   Final diagnoses:  Flu-like symptoms    ED Discharge Orders        Ordered    dextromethorphan-guaiFENesin [facility]MUCINEX DM) 30-600 MG 12hr tablet  2 times daily     08/05/17 0712    acetaminophen (TYLENOL) 325 MG tablet  Every 6 hours PRN     08/05/17 0712    ibuprofen (ADVIL,MOTRIN) 600 MG tablet  Every 6 hours PRN     08/05/17 [id number]       Derwood KaplanNanavati, Jaxsun Ciampi, MD 08/05/17 [phone]

## 2017-08-05 NOTE — Discharge Instructions (Signed)
°  We think what you have is a viral syndrome - the treatment for which is symptomatic relief only, and your body will fight the infection off in a few days. °We are prescribing you some meds for pain and fevers. °See your primary care doctor in 1 week if the symptoms dont improve. °

## 2017-08-05 NOTE — ED Triage Notes (Addendum)
Pt c/o body aches, nasal congestion, HA, abd discomfort, cough, back pain x 2 days.

## 2017-08-26 ENCOUNTER — Encounter (HOSPITAL_COMMUNITY): Payer: Self-pay | Admitting: Emergency Medicine

## 2017-08-26 ENCOUNTER — Emergency Department (HOSPITAL_COMMUNITY)
Admission: EM | Admit: 2017-08-26 | Discharge: 2017-08-26 | Disposition: A | Discharge status: Home / Self Care | Payer: BLUE CROSS/BLUE SHIELD | Attending: Emergency Medicine | Admitting: Emergency Medicine

## 2017-08-26 ENCOUNTER — Emergency Department (HOSPITAL_COMMUNITY): Payer: BLUE CROSS/BLUE SHIELD

## 2017-08-26 ENCOUNTER — Other Ambulatory Visit: Payer: Self-pay

## 2017-08-26 DIAGNOSIS — R109 Unspecified abdominal pain: Secondary | ICD-10-CM

## 2017-08-26 DIAGNOSIS — K8020 Calculus of gallbladder without cholecystitis without obstruction: Secondary | ICD-10-CM

## 2017-08-26 DIAGNOSIS — E0590 Thyrotoxicosis, unspecified without thyrotoxic crisis or storm: Secondary | ICD-10-CM | POA: Diagnosis not present

## 2017-08-26 DIAGNOSIS — I10 Essential (primary) hypertension: Secondary | ICD-10-CM | POA: Insufficient documentation

## 2017-08-26 DIAGNOSIS — R1084 Generalized abdominal pain: Secondary | ICD-10-CM | POA: Diagnosis present

## 2017-08-26 LAB — URINALYSIS, ROUTINE W REFLEX MICROSCOPIC
Bilirubin Urine: NEGATIVE
Glucose, UA: NEGATIVE mg/dL
Hgb urine dipstick: NEGATIVE
Ketones, ur: NEGATIVE mg/dL
LEUKOCYTES UA: NEGATIVE
Nitrite: NEGATIVE
PROTEIN: NEGATIVE mg/dL
Specific Gravity, Urine: 1.01 (ref 1.005–1.030)
pH: 5 (ref 5.0–8.0)

## 2017-08-26 LAB — COMPREHENSIVE METABOLIC PANEL
ALBUMIN: 3.6 g/dL (ref 3.5–5.0)
ALT: 33 U/L (ref 14–54)
ANION GAP: 11 (ref 5–15)
AST: 34 U/L (ref 15–41)
Alkaline Phosphatase: 49 U/L (ref 38–126)
BILIRUBIN TOTAL: 0.7 mg/dL (ref 0.3–1.2)
BUN: 9 mg/dL (ref 6–20)
CO2: 24 mmol/L (ref 22–32)
Calcium: 9.3 mg/dL (ref 8.9–10.3)
Chloride: 103 mmol/L (ref 101–111)
Creatinine, Ser: 0.89 mg/dL (ref 0.44–1.00)
GFR calc Af Amer: >60 mL/min (ref >60)
GLUCOSE: 108 mg/dL — AB (ref 65–99)
POTASSIUM: 3.7 mmol/L (ref 3.5–5.1)
Sodium: 138 mmol/L (ref 135–145)
TOTAL PROTEIN: 7.7 g/dL (ref 6.5–8.1)

## 2017-08-26 LAB — CBC
HEMATOCRIT: 36.7 % (ref 36.0–46.0)
Hemoglobin: 11.9 g/dL — ABNORMAL LOW (ref 12.0–15.0)
MCH: 28 pg (ref 26.0–34.0)
MCHC: 32.4 g/dL (ref 30.0–36.0)
MCV: 86.4 fL (ref 78.0–100.0)
PLATELETS: 310 10*3/uL (ref 150–400)
RBC: 4.25 MIL/uL (ref 3.87–5.11)
RDW: 13.4 % (ref 11.5–15.5)
WBC: 8.1 10*3/uL (ref 4.0–10.5)

## 2017-08-26 LAB — POC URINE PREG, ED: PREG TEST UR: NEGATIVE

## 2017-08-26 NOTE — Discharge Instructions (Signed)
It was my pleasure taking care of you today!   Alternate between Tylenol and Ibuprofen as needed for pain. Ice or heat can also aide in pain relief.   Follow up with your primary care doctor.  Return to ER for new or worsening symptoms, any additional concerns.

## 2017-08-26 NOTE — ED Triage Notes (Signed)
Patient with bilateral flank pain.  She states that she has the pain worse on the right than the left.  She states that it started about one week ago, but today has gotten worse.

## 2017-08-26 NOTE — ED Provider Notes (Signed)
[facility] EMERGENCY DEPARTMENT Provider Note   CSN: [id number] Arrival date & time: 08/26/17  4098     History   Chief Complaint Chief Complaint  Patient presents with  . Flank Pain    HPI Brittany [name] is a 37 y.o. female.  The history is provided by the patient and medical records. No language interpreter was used.  Flank Pain    Brittany [name] is a 37 y.o. female  with a PMH of HTN who presents to the Emergency Department complaining of right flank pain x 1 week.  Patient states that pain is worse with certain movements.  She reports using the bathroom more often than usual, but denies any dysuria, urgency or hesitancy.  She saw a fast med at onset of symptoms and was told pain was likely a muscle strain.  She has been taking prescription medication for this, however is unsure what the name of the medication is.  She does not feel as if it is helping.  She denies any history of similar.  No abdominal pain, nausea, vomiting or back pain.  No fever or chills.   Past Medical History:  Diagnosis Date  . Gallstone   . Headache(784.0)   . Hypertension   . Pregnancy induced hypertension    after 2nd delivery  . Vaginal Pap smear, abnormal     Patient Active Problem List   Diagnosis Date Noted  . Murmur, cardiac 11/30/2016  . Anemia 11/30/2016  . Migraine 03/18/2016  . Subclinical hyperthyroidism 12/09/2015    Past Surgical History:  Procedure Laterality Date  . DILATION AND CURETTAGE OF UTERUS    . LEEP      OB History    Gravida Para Term Preterm AB Living   8 3 2 1 5 3    SAB TAB Ectopic Multiple Live Births   4 1 0 0 3       Home Medications    Prior to Admission medications   Medication Sig Start Date End Date Taking? Authorizing Provider  acetaminophen (TYLENOL) 325 MG tablet Take 2 tablets (650 mg total) by mouth every 6 (six) hours as needed. 08/05/17   Derwood Kaplan, MD  dextromethorphan-guaiFENesin (MUCINEX DM) 30-600 MG  12hr tablet Take 1 tablet by mouth 2 (two) times daily. 08/05/17   Derwood Kaplan, MD  ibuprofen (ADVIL,MOTRIN) 600 MG tablet Take 1 tablet (600 mg total) by mouth every 6 (six) hours as needed. 08/05/17   Derwood Kaplan, MD  NON FORMULARY Take by mouth daily. Mylan--birth control    [provider]  Norethindrone Acetate-Ethinyl Estrad-FE (LOESTRIN 24 FE) 1-20 MG-MCG(24) tablet Take 1 tablet by mouth daily. 07/26/17   Brock Bad, MD  Prenatal Vit-Fe Fumarate-FA (PRENATAL MULTIVITAMIN) TABS tablet Take 1 tablet by mouth daily.     [provider]    Family History Family History  Problem Relation Age of Onset  . Asthma Mother   . Hypertension Mother   . Asthma Maternal Grandmother   . Hypertension Maternal Grandmother     Social History Social History   Tobacco Use  . Smoking status: Never Smoker  . Smokeless tobacco: Never Used  Substance Use Topics  . Alcohol use: No    Alcohol/week: 0.0 oz  . Drug use: No     Allergies   Patient has no known allergies.   Review of Systems Review of Systems  Genitourinary: Positive for flank pain and frequency. Negative for dysuria, urgency and vaginal discharge.  All other systems reviewed and are negative.    Physical Exam Updated Vital Signs BP (!) 136/92 (BP Location: Left Arm)   Pulse 67   Temp 98.4 F (36.9 C) (Oral)   Resp 16   LMP 08/03/2017 (Approximate)   SpO2 97%   Physical Exam  Constitutional: She is oriented to person, place, and time. She appears well-developed and well-nourished. No distress.  Well-appearing.  HENT:  Head: Normocephalic and atraumatic.  Cardiovascular: Normal rate, regular rhythm and normal heart sounds.  No murmur heard. Pulmonary/Chest: Effort normal and breath sounds normal. No respiratory distress.  Abdominal: Soft. Bowel sounds are normal. She exhibits no distension.  No abdominal or CVA tenderness.  Musculoskeletal:  Tenderness to right flank without overlying  skin changes. No crepitus. Reproducible with torso rotation. No midline spinal tenderness.  Neurological: She is alert and oriented to person, place, and time.  Skin: Skin is warm and dry.  Nursing note and vitals reviewed.    ED Treatments / Results  Labs (all labs ordered are listed, but only abnormal results are displayed) Labs Reviewed  CBC - Abnormal; Notable for the following components:      Result Value   Hemoglobin 11.9 (*)    All other components within normal limits  COMPREHENSIVE METABOLIC PANEL - Abnormal; Notable for the following components:   Glucose, Bld 108 (*)    All other components within normal limits  URINALYSIS, ROUTINE W REFLEX MICROSCOPIC  POC URINE PREG, ED    EKG  EKG Interpretation None       Radiology [place] [facility]  Result Date: 08/26/2017 CLINICAL DATA:  37 year old female with right flank pain for 1 week. EXAM: RENAL / URINARY TRACT ULTRASOUND COMPLETE COMPARISON:  None. FINDINGS: Right Kidney: Length: 10.5 cm. Echogenicity within normal limits. No mass or hydronephrosis visualized. Left Kidney: Length: 11.7 cm. Echogenicity within normal limits. No mass or hydronephrosis visualized. Bladder: Appears normal for degree of bladder distention. A 2.1 cm gallstone is identified. No sonographic evidence of acute cholecystitis. IMPRESSION: 1. Normal kidneys and bladder. 2. 2.1 cm gallstone without sonographic evidence of acute cholecystitis. Electronically Signed   By: Harmon PierJeffrey  Hu M.D.   On: 08/26/2017 10:01    Procedures Procedures (including critical care time)  Medications Ordered in ED Medications - No data to display   Initial Impression / Assessment and Plan / ED Course  I have reviewed the triage vital signs and the nursing notes.  Pertinent labs & imaging results that were available during my care of the patient were reviewed by me and considered in my medical decision making (see chart for details).    Brittany [name] is a 37 y.o. female  who presents to ED for 1 week of right-sided flank pain.  On exam, patient is afebrile, hemodynamically stable with no abdominal or CVA tenderness.  She does have reproducible tenderness to the right lateral flank/rib cage area.  Pain is also reproducible with torso rotation.  Labs reviewed and reassuring.  UA negative.  Musculoskeletal etiology most likely, kidney stone in differential. Will obtain renal ultrasound for further evaluation.   Renal ultrasound with normal kidneys and bladder. Incidentally, 2 cm gallstone was noted without evidence of acute cholecystitis. Patient re-evaluated with no abdominal pain nor tenderness to this area. No further emergent work up needed, but will have patient follow up with PCP. Home care instructions and return precautions discussed. All questions answered.   Final Clinical Impressions(s) / ED Diagnoses   Final diagnoses:  Flank  pain  Calculus of gallbladder without cholecystitis without obstruction    ED Discharge Orders    None       Remus Hagedorn, Chase Picket, PA-C 08/26/17 1021    Maia Plan, MD 08/26/17 2008

## 2017-10-04 ENCOUNTER — Ambulatory Visit (INDEPENDENT_AMBULATORY_CARE_PROVIDER_SITE_OTHER): Payer: No Typology Code available for payment source | Admitting: Nurse Practitioner

## 2017-10-04 ENCOUNTER — Encounter: Payer: Self-pay | Admitting: Nurse Practitioner

## 2017-10-04 ENCOUNTER — Other Ambulatory Visit (INDEPENDENT_AMBULATORY_CARE_PROVIDER_SITE_OTHER): Payer: No Typology Code available for payment source

## 2017-10-04 VITALS — BP 124/80 | HR 68 | Temp 98.4°F | Resp 16 | Ht 65.0 in | Wt 175.0 lb

## 2017-10-04 DIAGNOSIS — E663 Overweight: Secondary | ICD-10-CM

## 2017-10-04 DIAGNOSIS — Z13220 Encounter for screening for lipoid disorders: Secondary | ICD-10-CM

## 2017-10-04 DIAGNOSIS — Z0001 Encounter for general adult medical examination with abnormal findings: Secondary | ICD-10-CM

## 2017-10-04 DIAGNOSIS — R011 Cardiac murmur, unspecified: Secondary | ICD-10-CM

## 2017-10-04 LAB — CBC WITH DIFFERENTIAL/PLATELET
Basophils Absolute: 0.1 10*3/uL (ref 0.0–0.1)
Basophils Relative: 1.5 % (ref 0.0–3.0)
EOS PCT: 1.5 % (ref 0.0–5.0)
Eosinophils Absolute: 0.1 10*3/uL (ref 0.0–0.7)
HEMATOCRIT: 38.6 % (ref 36.0–46.0)
HEMOGLOBIN: 12.7 g/dL (ref 12.0–15.0)
LYMPHS PCT: 31 % (ref 12.0–46.0)
Lymphs Abs: 2.3 10*3/uL (ref 0.7–4.0)
MCHC: 32.9 g/dL (ref 30.0–36.0)
MCV: 85.3 fl (ref 78.0–100.0)
MONO ABS: 0.5 10*3/uL (ref 0.1–1.0)
Monocytes Relative: 7 % (ref 3.0–12.0)
Neutro Abs: 4.4 10*3/uL (ref 1.4–7.7)
Neutrophils Relative %: 59 % (ref 43.0–77.0)
Platelets: 302 10*3/uL (ref 150.0–400.0)
RBC: 4.52 Mil/uL (ref 3.87–5.11)
RDW: 13.3 % (ref 11.5–15.5)
WBC: 7.5 10*3/uL (ref 4.0–10.5)

## 2017-10-04 LAB — COMPREHENSIVE METABOLIC PANEL
ALBUMIN: 4.6 g/dL (ref 3.5–5.2)
ALK PHOS: 41 U/L (ref 39–117)
ALT: 26 U/L (ref 0–35)
AST: 23 U/L (ref 0–37)
BUN: 11 mg/dL (ref 6–23)
CALCIUM: 10 mg/dL (ref 8.4–10.5)
CO2: 23 mEq/L (ref 19–32)
Chloride: 105 mEq/L (ref 96–112)
Creatinine, Ser: 0.78 mg/dL (ref 0.40–1.20)
GFR: 108.35 mL/min (ref >60.00)
Glucose, Bld: 96 mg/dL (ref 70–99)
POTASSIUM: 4 meq/L (ref 3.5–5.1)
SODIUM: 142 meq/L (ref 135–145)
TOTAL PROTEIN: 8.4 g/dL — AB (ref 6.0–8.3)
Total Bilirubin: 0.3 mg/dL (ref 0.2–1.2)

## 2017-10-04 LAB — LIPID PANEL
CHOLESTEROL: 147 mg/dL (ref 0–200)
HDL: 67.2 mg/dL (ref >39.00)
LDL CALC: 63 mg/dL (ref 0–99)
NonHDL: 79.34
TRIGLYCERIDES: 81 mg/dL (ref 0.0–149.0)
Total CHOL/HDL Ratio: 2
VLDL: 16.2 mg/dL (ref 0.0–40.0)

## 2017-10-04 LAB — HEMOGLOBIN A1C: Hgb A1c MFr Bld: 5.7 % (ref 4.6–6.5)

## 2017-10-04 LAB — TSH: TSH: 0.7 u[IU]/mL (ref 0.35–4.50)

## 2017-10-04 NOTE — Patient Instructions (Addendum)
Please return to the lab downstairs for labwork when fasting- only water or black coffee for 8 hours prior.  I have placed a referral to cardiology and nutrition. Our office will call you to schedule this appointment. You should hear from our office in 7-10 days.  Please work on your diet and exercise as we discussed. Remember half of your plate should be veggies, one-fourth carbs, one-fourth meat, and don't eat meat at every meal. Also, remember to stay away from sugary drinks. I'd like for you to start incorporating exercise into your daily schedule. Start at 10 minutes a day, working up to 30 minutes five times a week.  myfitnesspal app might be helpful  Please return in 1 year, or sooner if you need me.   Preventive Care 18-39 Years, Female Preventive care refers to lifestyle choices and visits with your health care provider that can promote health and wellness. What does preventive care include?  A yearly physical exam. This is also called an annual well check.  Dental exams once or twice a year.  Routine eye exams. Ask your health care provider how often you should have your eyes checked.  Personal lifestyle choices, including: ? Daily care of your teeth and gums. ? Regular physical activity. ? Eating a healthy diet. ? Avoiding tobacco and drug use. ? Limiting alcohol use. ? Practicing safe sex. ? Taking vitamin and mineral supplements as recommended by your health care provider. What happens during an annual well check? The services and screenings done by your health care provider during your annual well check will depend on your age, overall health, lifestyle risk factors, and family history of disease. Counseling Your health care provider may ask you questions about your:  Alcohol use.  Tobacco use.  Drug use.  Emotional well-being.  Home and relationship well-being.  Sexual activity.  Eating habits.  Work and work Statistician.  Method of birth  control.  Menstrual cycle.  Pregnancy history.  Screening You may have the following tests or measurements:  Height, weight, and BMI.  Diabetes screening. This is done by checking your blood sugar (glucose) after you have not eaten for a while (fasting).  Blood pressure.  Lipid and cholesterol levels. These may be checked every 5 years starting at age 32.  Skin check.  Hepatitis C blood test.  Hepatitis B blood test.  Sexually transmitted disease (STD) testing.  BRCA-related cancer screening. This may be done if you have a family history of breast, ovarian, tubal, or peritoneal cancers.  Pelvic exam and Pap test. This may be done every 3 years starting at age 86. Starting at age 13, this may be done every 5 years if you have a Pap test in combination with an HPV test.  Discuss your test results, treatment options, and if necessary, the need for more tests with your health care provider. Vaccines Your health care provider may recommend certain vaccines, such as:  Influenza vaccine. This is recommended every year.  Tetanus, diphtheria, and acellular pertussis (Tdap, Td) vaccine. You may need a Td booster every 10 years.  Varicella vaccine. You may need this if you have not been vaccinated.  HPV vaccine. If you are 63 or younger, you may need three doses over 6 months.  Measles, mumps, and rubella (MMR) vaccine. You may need at least one dose of MMR. You may also need a second dose.  Pneumococcal 13-valent conjugate (PCV13) vaccine. You may need this if you have certain conditions and were not previously  vaccinated.  Pneumococcal polysaccharide (PPSV23) vaccine. You may need one or two doses if you smoke cigarettes or if you have certain conditions.  Meningococcal vaccine. One dose is recommended if you are age 22-21 years and a first-year college student living in a residence hall, or if you have one of several medical conditions. You may also need additional booster  doses.  Hepatitis A vaccine. You may need this if you have certain conditions or if you travel or work in places where you may be exposed to hepatitis A.  Hepatitis B vaccine. You may need this if you have certain conditions or if you travel or work in places where you may be exposed to hepatitis B.  Haemophilus influenzae type b (Hib) vaccine. You may need this if you have certain risk factors.  Talk to your health care provider about which screenings and vaccines you need and how often you need them. This information is not intended to replace advice given to you by your health care provider. Make sure you discuss any questions you have with your health care provider. Document Released: 08/30/2001 Document Revised: 03/23/2016 Document Reviewed: 05/05/2015 Elsevier Interactive Patient Education  [employer].

## 2017-10-04 NOTE — Assessment & Plan Note (Signed)
-  USPSTF grade A and B recommendations reviewed with patient; age-appropriate recommendations, preventive care, screening tests, etc discussed and encouraged; healthy living encouraged; see AVS for patient education given to patient -Discussed importance of 150 minutes of physical activity weekly, eat two servings of fish weekly, eat 6 servings of fruit/vegetables daily and drink plenty of water and avoid sweet beverages.   -Reviewed Health Maintenance: up to date She will continue to F/U with GYN for womens care  Screening for cholesterol level-- Lipid panel; Future Not fasting today but would prefer to get labs drawn now for convenience  Overweight (BMI 25.0-29.9) She is interested in healthy weight loss today, concerned that she is overweight We discussed healthy diet and exercise and will refer to dietary education She will F/U if needed - TSH; Future - Lipid panel; Future - CBC with Differential/Platelet; Future - Comprehensive metabolic panel; Future - Hemoglobin A1c; Future - Ambulatory referral to Nutrition and Diabetic Education

## 2017-10-04 NOTE — Assessment & Plan Note (Signed)
Not auscultated on PE today but documented by prior provider We dicussed referral to cardiology for further evaluation due to patient concern and she is agreeable - Ambulatory referral to Cardiology

## 2017-10-04 NOTE — Progress Notes (Signed)
Name: Brittany [name]   MRN: [id number]    DOB: [birth year]   Date:10/04/2017       Progress Note  Subjective  Chief Complaint  Chief Complaint  Patient presents with  . Establish Care    weight loss option cpe    HPI  Patient is establishing care-transferring from another provider in our clinic who left this location Patient presents for annual CPE.  Diet: greasy, fried foods most days, does not eat many fruits or vegetables, drinks juice Exercise: busy with 3 children- does participate in their activities but does not routinely workout   USPSTF grade A and B recommendations  Depression: no concerns for anxiety or depression [facility] 11/30/2016 12/16/2015 12/09/2015  Decreased Interest 0 0 0  Down, Depressed, Hopeless 0 0 0  PHQ - 2 Score 0 0 0   Hypertension: BP Readings from Last 3 Encounters:  10/04/17 124/80  08/26/17 (!) 136/92  08/05/17 133/80   Obesity: Wt Readings from Last 3 Encounters:  10/04/17 175 lb (79.4 kg)  08/05/17 180 lb (81.6 kg)  03/08/17 180 lb (81.6 kg)   BMI Readings from Last 3 Encounters:  10/04/17 29.12 kg/m  08/05/17 29.95 kg/m  03/08/17 29.95 kg/m    Alcohol: no Tobacco use: no, never HIV screening: up to date STD testing and prevention (chl/gon/syphilis): declines today Intimate partner violence: denies  Menstrual History/LMP/Abnormal Bleeding: denies abnormal bleeding or vaginal discharge, reports regular F/U with GYN for annual womens care and management of birth control, pap is up to date Incontinence Symptoms: denies   Vaccinations: up to date  Advanced Care Planning: A voluntary discussion about advance care planning including the explanation and discussion of advance directives.  Discussed health care proxy and Living will, and the patient DOES NOT have a living will at present time. If patient does have living will, I have requested they bring this to the clinic to be scanned in to their chart.  Cervical  cancer screening: pap is up to date  Lipids:  Lab Results  Component Value Date   CHOL 136 11/30/2016   Lab Results  Component Value Date   HDL 67.50 11/30/2016   Lab Results  Component Value Date   LDLCALC 57 11/30/2016   Lab Results  Component Value Date   TRIG 54.0 11/30/2016   Lab Results  Component Value Date   CHOLHDL 2 11/30/2016   No results found for: LDLDIRECT  Glucose:  Glucose, Bld  Date Value Ref Range Status  08/26/2017 108 (H) 65 - 99 mg/dL Final  78/29/562105/16/2018 92 70 - 99 mg/dL Final  30/86/578410/18/2017 95 65 - 99 mg/dL Final   Glucose-Capillary  Date Value Ref Range Status  01/05/2016 106 (H) 65 - 99 mg/dL Final    Skin cancer: No concerns today Colorectal cancer: No personal or family history of colon cancer, denies constipation, diarrhea, rectal bleeding  Aspirin: not indicated ECG: not indicated   Patient Active Problem List   Diagnosis Date Noted  . Murmur, cardiac 11/30/2016  . Anemia 11/30/2016  . Migraine 03/18/2016  . Subclinical hyperthyroidism 12/09/2015    Past Surgical History:  Procedure Laterality Date  . DILATION AND CURETTAGE OF UTERUS    . LEEP      Family History  Problem Relation Age of Onset  . Asthma Mother   . Hypertension Mother   . Asthma Maternal Grandmother   . Hypertension Maternal Grandmother     Social History   Socioeconomic History  .  Marital status: Married    Spouse name: Not on file  . Number of children: Not on file  . Years of education: Not on file  . Highest education level: Not on file  Social Needs  . Financial resource strain: Not on file  . Food insecurity - worry: Not on file  . Food insecurity - inability: Not on file  . Transportation needs - medical: Not on file  . Transportation needs - non-medical: Not on file  Occupational History  . Not on file  Tobacco Use  . Smoking status: Never Smoker  . Smokeless tobacco: Never Used  Substance and Sexual Activity  . Alcohol use: No     Alcohol/week: 0.0 oz  . Drug use: No  . Sexual activity: Yes    Birth control/protection: Pill  Other Topics Concern  . Not on file  Social History Narrative  . Not on file     Current Outpatient Medications:  .  Norethindrone Acetate-Ethinyl Estrad-FE (LOESTRIN 24 FE) 1-20 MG-MCG(24) tablet, Take 1 tablet by mouth daily., Disp: 1 Package, Rfl: 11  No Known Allergies   ROS  Constitutional: Negative for fever or weight change.  Respiratory: Negative for cough and shortness of breath.   Cardiovascular: Negative for chest pain or palpitations.  Gastrointestinal: Negative for abdominal pain, no bowel changes.  Musculoskeletal: Negative for gait problem or joint swelling.  Skin: Negative for rash.  Neurological: Negative for dizziness or headache.  No other specific complaints in a complete review of systems (except as listed in HPI above).   Objective  Vitals:   10/04/17 0927  BP: 124/80  Pulse: 68  Resp: 16  Temp: 98.4 F (36.9 C)  TempSrc: Oral  SpO2: 95%  Weight: 175 lb (79.4 kg)  Height: 5\' 5"  (1.651 m)    Body mass index is 29.12 kg/m.  Physical Exam Vital signs reviewed. Constitutional: Patient appears well-developed and well-nourished. No distress.  HENT: Head: Normocephalic and atraumatic. Ears: B TMs ok, no erythema or effusion; scant cerumen in right external ear canal; Nose: Nose normal. Mouth/Throat: Oropharynx is clear and moist. No oropharyngeal exudate.  Eyes: Conjunctivae and EOM are normal. Pupils are equal, round, and reactive to light. No scleral icterus.  Neck: Normal range of motion. Neck supple. No JVD present. No thyromegaly present.  Cardiovascular: Normal rate, regular rhythm and normal heart sounds.  No murmur heard. No BLE edema. Pulmonary/Chest: Effort normal and breath sounds normal. No respiratory distress. Abdominal: Soft. Bowel sounds are normal, no distension. There is no tenderness. no masses FEMALE GENITALIA:  Deferred to  GYN RECTAL: Deferred to GYN Musculoskeletal: Normal range of motion, no joint effusions. No gross deformities Neurological: She is alert and oriented to person, place, and time. No cranial nerve deficit. Coordination, balance, strength, speech and gait are normal.  Skin: Skin is warm and dry. No rash noted. No erythema.  Psychiatric: Patient has a normal mood and affect. behavior is normal. Judgment and thought content normal.   PHQ2/9: Depression screen [facility] 2/9 11/30/2016 12/16/2015 12/09/2015  Decreased Interest 0 0 0  Down, Depressed, Hopeless 0 0 0  PHQ - 2 Score 0 0 0    Fall Risk: Fall Risk  11/30/2016 12/16/2015 12/09/2015 11/20/2015 11/18/2015  Falls in the past year? No No No No No   Assessment & Plan RTC in 1 year for CPE

## 2017-10-19 ENCOUNTER — Encounter
Payer: No Typology Code available for payment source | Attending: Nurse Practitioner | Admitting: Skilled Nursing Facility1

## 2017-10-19 ENCOUNTER — Encounter: Payer: Self-pay | Admitting: Skilled Nursing Facility1

## 2017-10-19 DIAGNOSIS — Z6828 Body mass index (BMI) 28.0-28.9, adult: Secondary | ICD-10-CM | POA: Insufficient documentation

## 2017-10-19 DIAGNOSIS — Z713 Dietary counseling and surveillance: Secondary | ICD-10-CM | POA: Diagnosis present

## 2017-10-19 DIAGNOSIS — E663 Overweight: Secondary | ICD-10-CM

## 2017-10-19 NOTE — Patient Instructions (Addendum)
-  Aim to only have fried foods 2 times a week or less; if you find yourself wanting fried food have yogurt, rice cake, or 1 slice of bread with peanut butter 1-2 T  -Aim to eat every 3-5 hours   -https://whatscooking.AdDates.czfns.usda.gov/  -Eat 3 meals a day and snacks in between (if you are hungry for the snacks) -A meal: carbohydrate, protein, vegetable -A snack: A Fruit OR Vegetable AND Protein -Honor your body by listening to your hunger and fullness cues -First thought: Am I hungry? -Second thought: (if the answer is yes I am hungry) Does this meal have vegetables? Protein? Carbohydrate?  -Third thought: Are there more vegetables on my plate compared to Protein and Carbohydrates? -After you have finished your first serving Do Not go back for more until you have waited 20-30 minutes and checked in with your body by asking Am I Still Hungry?   -Drink from the tap with lemon in your water

## 2017-10-19 NOTE — Progress Notes (Signed)
  Assessment:  Primary concerns today: overweight.  Pt states she Does not know why she is here; stating her doctor just sent her here. Pt states she sleeps okay but it is not where she wants to be with 3 children and 37 year old. Pt states she has 1 bowel movement every day. Pt states her energy level is okay but could be better. Pt states she is trying to cut back on fried food. Pt states she works for [employer] 6am-10am. Pt states she just wants information on being healthier and eating for her body.   MEDICATIONS: N/A   DIETARY INTAKE:  Usual eating pattern includes 3 meals and 0 snacks per day.  Everyday foods include none stated.  Avoided foods include none stated.    24-hr recall:  B ( 5AM): banana or sausage and american cheese on ramen noodles Snk ( AM):  L ( PM): pizza and fries  Snk ( PM):  D ( PM): chicken and french fries  Snk ( PM): Beverages: water with flavorings   Usual physical activity: ADL's  Progress Towards Goal(s):  In progress.    Intervention:  Nutrition coounseling for appropriate nutrition.  Goals: -Aim to only have fried foods 2 times a week or less; if you find yourself wanting fried food have yogurt, rice cake, or 1 slice of bread with peanut butter 1-2 T -Aim to eat every 3-5 hours  -https://whatscooking.AdDates.czfns.usda.gov/ -Eat 3 meals a day and snacks in between (if you are hungry for the snacks) -A meal: carbohydrate, protein, vegetable -A snack: A Fruit OR Vegetable AND Protein -Honor your body by listening to your hunger and fullness cues -First thought: Am I hungry? -Second thought: (if the answer is yes I am hungry) Does this meal have vegetables? Protein? Carbohydrate?  -Third thought: Are there more vegetables on my plate compared to Protein and Carbohydrates? -After you have finished your first serving Do Not go back for more until you have waited 20-30 minutes and checked in with your body by asking Am I Still Hungry?  -Drink from the tap with  lemon in your water  Teaching Method Utilized:  Visual Auditory Hands on  Handouts given during visit include:  Meal ideas  Am I hungry  myplate  Barriers to learning/adherence to lifestyle change: none identified   Demonstrated degree of understanding via:  Teach Back   Monitoring/Evaluation:  Dietary intake, exercise,and body weight prn.

## 2017-11-07 ENCOUNTER — Inpatient Hospital Stay (HOSPITAL_COMMUNITY): Payer: No Typology Code available for payment source

## 2017-11-07 ENCOUNTER — Encounter (HOSPITAL_COMMUNITY): Payer: Self-pay | Admitting: *Deleted

## 2017-11-07 ENCOUNTER — Inpatient Hospital Stay (HOSPITAL_COMMUNITY)
Admission: AD | Admit: 2017-11-07 | Discharge: 2017-11-07 | Disposition: A | Discharge status: Home / Self Care | Payer: No Typology Code available for payment source | Source: Ambulatory Visit | Attending: Family Medicine | Admitting: Family Medicine

## 2017-11-07 ENCOUNTER — Other Ambulatory Visit: Payer: Self-pay

## 2017-11-07 DIAGNOSIS — Z3A40 40 weeks gestation of pregnancy: Secondary | ICD-10-CM | POA: Diagnosis not present

## 2017-11-07 DIAGNOSIS — Z825 Family history of asthma and other chronic lower respiratory diseases: Secondary | ICD-10-CM | POA: Diagnosis not present

## 2017-11-07 DIAGNOSIS — M545 Low back pain, unspecified: Secondary | ICD-10-CM

## 2017-11-07 DIAGNOSIS — R109 Unspecified abdominal pain: Secondary | ICD-10-CM

## 2017-11-07 DIAGNOSIS — Z8249 Family history of ischemic heart disease and other diseases of the circulatory system: Secondary | ICD-10-CM | POA: Insufficient documentation

## 2017-11-07 DIAGNOSIS — O3680X0 Pregnancy with inconclusive fetal viability, not applicable or unspecified: Secondary | ICD-10-CM

## 2017-11-07 DIAGNOSIS — O26891 Other specified pregnancy related conditions, first trimester: Secondary | ICD-10-CM | POA: Diagnosis not present

## 2017-11-07 LAB — CBC
HCT: 37.5 % (ref 36.0–46.0)
HEMOGLOBIN: 12.5 g/dL (ref 12.0–15.0)
MCH: 27.9 pg (ref 26.0–34.0)
MCHC: 33.3 g/dL (ref 30.0–36.0)
MCV: 83.7 fL (ref 78.0–100.0)
Platelets: 263 10*3/uL (ref 150–400)
RBC: 4.48 MIL/uL (ref 3.87–5.11)
RDW: 13.1 % (ref 11.5–15.5)
WBC: 7.5 10*3/uL (ref 4.0–10.5)

## 2017-11-07 LAB — URINALYSIS, ROUTINE W REFLEX MICROSCOPIC
Bacteria, UA: NONE SEEN
Bilirubin Urine: NEGATIVE
Glucose, UA: NEGATIVE mg/dL
Ketones, ur: 5 mg/dL — AB
Leukocytes, UA: NEGATIVE
NITRITE: NEGATIVE
PH: 5 (ref 5.0–8.0)
Protein, ur: NEGATIVE mg/dL
Specific Gravity, Urine: 1.018 (ref 1.005–1.030)

## 2017-11-07 LAB — WET PREP, GENITAL
CLUE CELLS WET PREP: NONE SEEN
Trich, Wet Prep: NONE SEEN
WBC WET PREP: NONE SEEN
YEAST WET PREP: NONE SEEN

## 2017-11-07 LAB — HCG, QUANTITATIVE, PREGNANCY: HCG, BETA CHAIN, QUANT, S: 402 m[IU]/mL — AB (ref <5)

## 2017-11-07 LAB — POCT PREGNANCY, URINE: Preg Test, Ur: POSITIVE — AB

## 2017-11-07 MED ORDER — CYCLOBENZAPRINE HCL 5 MG PO TABS: 5.0000 mg | ORAL_TABLET | Freq: Two times a day (BID) | ORAL | 0 refills | Status: DC | PRN

## 2017-11-07 MED ORDER — ACETAMINOPHEN 500 MG PO TABS
1000.0000 mg | ORAL_TABLET | Freq: Once | ORAL | Status: AC
  Administered 2017-11-07: 1000 mg via ORAL
  Filled 2017-11-07: qty 2

## 2017-11-07 NOTE — Progress Notes (Signed)
Discharge instructions given, questions answered, pt states understanding. Signed and given copy 

## 2017-11-07 NOTE — MAU Note (Signed)
Been having low back pain, was really bad 2 days ago.  Haven't had monthly since 3/11. +HPT last night.

## 2017-11-07 NOTE — Discharge Instructions (Signed)
Return to care   If you have heavier bleeding that soaks through more that 2 pads per hour for an hour or more  If you bleed so much that you feel like you might pass out or you do pass out  If you have significant abdominal pain that is not improved with Tylenol   If you develop a fever > 100.5      Back Pain in Pregnancy Back pain during pregnancy is common. Back pain may be caused by several factors that are related to changes during your pregnancy. Follow these instructions at home: Managing pain, stiffness, and swelling  If directed, apply ice for sudden (acute) back pain. ? Put ice in a plastic bag. ? Place a towel between your skin and the bag. ? Leave the ice on for 20 minutes, 2-3 times per day.  If directed, apply heat to the affected area before you exercise: ? Place a towel between your skin and the heat pack or heating pad. ? Leave the heat on for 20-30 minutes. ? Remove the heat if your skin turns bright red. This is especially important if you are unable to feel pain, heat, or cold. You may have a greater risk of getting burned. Activity  Exercise as told by your health care provider. Exercising is the best way to prevent or manage back pain.  Listen to your body when lifting. If lifting hurts, ask for help or bend your knees. This uses your leg muscles instead of your back muscles.  Squat down when picking up something from the floor. Do not bend over.  Only use bed rest as told by your health care provider. Bed rest should only be used for the most severe episodes of back pain. Standing, Sitting, and Lying Down  Do not stand in one place for long periods of time.  Use good posture when sitting. Make sure your head rests over your shoulders and is not hanging forward. Use a pillow on your lower back if necessary.  Try sleeping on your side, preferably the left side, with a pillow or two between your legs. If you are sore after a night's rest, your bed may be  too soft. A firm mattress may provide more support for your back during pregnancy. General instructions  Do not wear high heels.  Eat a healthy diet. Try to gain weight within your health care provider's recommendations.  Use a maternity girdle, elastic sling, or back brace as told by your health care provider.  Take over-the-counter and prescription medicines only as told by your health care provider.  Keep all follow-up visits as told by your health care provider. This is important. This includes any visits with any specialists, such as a physical therapist. Contact a health care provider if:  Your back pain interferes with your daily activities.  You have increasing pain in other parts of your body. Get help right away if:  You develop numbness, tingling, weakness, or problems with the use of your arms or legs.  You develop severe back pain that is not controlled with medicine.  You have a sudden change in bowel or bladder control.  You develop shortness of breath, dizziness, or you faint.  You develop nausea, vomiting, or sweating.  You have back pain that is a rhythmic, cramping pain similar to labor pains. Labor pain is usually 1-2 minutes apart, lasts for about 1 minute, and involves a bearing down feeling or pressure in your pelvis.  You have back  pain and your water breaks or you have vaginal bleeding.  You have back pain or numbness that travels down your leg.  Your back pain developed after you fell.  You develop pain on one side of your back.  You see blood in your urine.  You develop skin blisters in the area of your back pain. This information is not intended to replace advice given to you by your health care provider. Make sure you discuss any questions you have with your health care provider. Document Released: 10/12/2005 Document Revised: 12/10/2015 Document Reviewed: 03/18/2015 Elsevier Interactive Patient Education  [employer]2018 [employer] Inc[employer]

## 2017-11-07 NOTE — MAU Provider Note (Signed)
History     CSN: [id number]  Arrival date and time: 11/07/17 1034   First Provider Initiated Contact with Patient 11/07/17 1141      Chief Complaint  Patient presents with  . Possible Pregnancy  . Back Pain   HPI Brittany [name] is a 37 y.o. [phone] at [redacted]w[redacted]d by LMP who presents low back pain. Was on OCPs for contraception but ran out of them last month. Reports mid to low back pain for the last week that has worsened in the last 2 days. Pain is constant and radiates to bilateral lower abdomen. Rates pain 9/10. Has not treated symptoms. Nothing makes pain better or worse. Denies fever/chills, n/v/d, constipation, dysuria, hematuria, vaginal bleeding, or vaginal discharge. Had positive HPT last night.   OB History    Gravida  9   Para  3   Term  2   Preterm  1   AB  5   Living  3     SAB  4   TAB  1   Ectopic  0   Multiple  0   Live Births  3           Past Medical History:  Diagnosis Date  . Gallstone   . Headache(784.0)   . Hypertension   . Pregnancy induced hypertension    after 2nd delivery  . Vaginal Pap smear, abnormal     Past Surgical History:  Procedure Laterality Date  . DILATION AND CURETTAGE OF UTERUS    . LEEP      Family History  Problem Relation Age of Onset  . Asthma Mother   . Hypertension Mother   . Asthma Maternal Grandmother   . Hypertension Maternal Grandmother     Social History   Tobacco Use  . Smoking status: Never Smoker  . Smokeless tobacco: Never Used  Substance Use Topics  . Alcohol use: No    Alcohol/week: 0.0 oz  . Drug use: No    Allergies: No Known Allergies  Medications Prior to Admission  Medication Sig Dispense Refill Last Dose  . Norethindrone Acetate-Ethinyl Estrad-FE (LOESTRIN 24 FE) 1-20 MG-MCG(24) tablet Take 1 tablet by mouth daily. 1 Package 11 Past Month at Unknown time    Review of Systems  Constitutional: Negative.   Gastrointestinal: Positive for abdominal pain. Negative for  constipation, diarrhea, nausea and vomiting.  Genitourinary: Negative.   Musculoskeletal: Positive for back pain.   Physical Exam   Blood pressure 134/89, pulse 85, temperature 99.5 F (37.5 C), temperature source Oral, resp. rate 16, weight 172 lb 12 oz (78.4 kg), last menstrual period 09/25/2017, SpO2 100 %, currently breastfeeding.  Physical Exam  Nursing note and vitals reviewed. Constitutional: She is oriented to person, place, and time. She appears well-developed and well-nourished. No distress.  HENT:  Head: Normocephalic and atraumatic.  Eyes: Conjunctivae are normal. Right eye exhibits no discharge. Left eye exhibits no discharge. No scleral icterus.  Neck: Normal range of motion.  Respiratory: Effort normal. No respiratory distress.  GI: Soft. She exhibits no distension. There is no tenderness. There is no rebound and no CVA tenderness.  Musculoskeletal:       Lumbar back: Normal.  Neurological: She is alert and oriented to person, place, and time.  Skin: Skin is warm and dry. She is not diaphoretic.  Psychiatric: She has a normal mood and affect. Her behavior is normal. Judgment and thought content normal.    MAU Course  Procedures Results for orders placed or  performed during the hospital encounter of 11/07/17 (from the past 24 hour(s))  Urinalysis, Routine w reflex microscopic     Status: Abnormal   Collection Time: 11/07/17 11:20 AM  Result Value Ref Range   Color, Urine YELLOW YELLOW   APPearance CLEAR CLEAR   Specific Gravity, Urine 1.018 1.005 - 1.030   pH 5.0 5.0 - 8.0   Glucose, UA NEGATIVE NEGATIVE mg/dL   Hgb urine dipstick SMALL (A) NEGATIVE   Bilirubin Urine NEGATIVE NEGATIVE   Ketones, ur 5 (A) NEGATIVE mg/dL   Protein, ur NEGATIVE NEGATIVE mg/dL   Nitrite NEGATIVE NEGATIVE   Leukocytes, UA NEGATIVE NEGATIVE   RBC / HPF 0-5 0 - 5 RBC/hpf   WBC, UA 0-5 0 - 5 WBC/hpf   Bacteria, UA NONE SEEN NONE SEEN   Squamous Epithelial / LPF 0-5 0 - 5   Mucus  PRESENT   Pregnancy, urine POC     Status: Abnormal   Collection Time: 11/07/17 11:29 AM  Result Value Ref Range   Preg Test, Ur POSITIVE (A) NEGATIVE  CBC     Status: None   Collection Time: 11/07/17 12:06 PM  Result Value Ref Range   WBC 7.5 4.0 - 10.5 K/uL   RBC 4.48 3.87 - 5.11 MIL/uL   Hemoglobin 12.5 12.0 - 15.0 g/dL   HCT 54.037.5 98.136.0 - 19.146.0 %   MCV 83.7 78.0 - 100.0 fL   MCH 27.9 26.0 - 34.0 pg   MCHC 33.3 30.0 - 36.0 g/dL   RDW 47.813.1 29.511.5 - 62.115.5 %   Platelets 263 150 - 400 K/uL  hCG, quantitative, pregnancy     Status: Abnormal   Collection Time: 11/07/17 12:06 PM  Result Value Ref Range   hCG, Beta Chain, Quant, S 402 (H) <5 mIU/mL  Wet prep, genital     Status: None   Collection Time: 11/07/17 12:30 PM  Result Value Ref Range   Yeast Wet Prep HPF POC NONE SEEN NONE SEEN   Trich, Wet Prep NONE SEEN NONE SEEN   Clue Cells Wet Prep HPF POC NONE SEEN NONE SEEN   WBC, Wet Prep HPF POC NONE SEEN NONE SEEN   Sperm PRESENT     MDM +UPT UA, wet prep, GC/chlamydia, CBC, ABO/Rh, quant hCG, HIV, and US today to rule out ectopic pregnancy O positive Ultrasound shows no IUP or adnexal mass. HCG 402 Will have patient return for f/u hcg   Assessment and Plan  A; 1. Abdominal pain during pregnancy in first trimester   2. Pregnancy of unknown anatomic location   3. Acute bilateral low back pain without sciatica    P: Discharge home Go to [facility] Friday morning (no appts available Thursday) Discussed reasons to return to MAU Rx flexeril #10 GC/CT pending  [name] 11/07/2017, 11:41 AM

## 2017-11-07 NOTE — MAU Note (Signed)
Lower back pain started 2 days ago. Pain 9/10, constant

## 2017-11-08 LAB — GC/CHLAMYDIA PROBE AMP (~~LOC~~) NOT AT ARMC
CHLAMYDIA, DNA PROBE: NEGATIVE
NEISSERIA GONORRHEA: NEGATIVE

## 2017-11-10 ENCOUNTER — Ambulatory Visit (INDEPENDENT_AMBULATORY_CARE_PROVIDER_SITE_OTHER): Payer: No Typology Code available for payment source | Admitting: General Practice

## 2017-11-10 DIAGNOSIS — O3680X0 Pregnancy with inconclusive fetal viability, not applicable or unspecified: Secondary | ICD-10-CM

## 2017-11-10 DIAGNOSIS — O283 Abnormal ultrasonic finding on antenatal screening of mother: Secondary | ICD-10-CM

## 2017-11-10 LAB — HCG, QUANTITATIVE, PREGNANCY: hCG, Beta Chain, Quant, S: 1587 m[IU]/mL — ABNORMAL HIGH (ref <5)

## 2017-11-10 NOTE — Progress Notes (Signed)
Notified Thressa ShellerHeather Hogan, CNM pt's STAT beta results.  Provider's recommendation is if pt does not have sx then to schedule a f/u US in 10 days.  If pt does have sx pt should be scheduled for another beta draw in 48 hrs.  Pt reports back pain no abdominal pain and no bleeding.  OB f/u US scheduled for May 7th @ 0815.  Pt notified.   Pt advised to continue to monitor for changed sx.  Pt stated understanding with no further questions.

## 2017-11-10 NOTE — Progress Notes (Signed)
Patient here for stat bhcg today. Patient reports continued back pain and denies vaginal bleeding. Discussed we are monitoring her beta hcg levels today and asked she wait in lobby for results/updated plan of care. Patient verbalized understanding & has no questions at this time.

## 2017-11-11 ENCOUNTER — Emergency Department (HOSPITAL_COMMUNITY)
Admission: EM | Admit: 2017-11-11 | Discharge: 2017-11-12 | Disposition: A | Discharge status: Home / Self Care | Payer: No Typology Code available for payment source | Attending: Emergency Medicine | Admitting: Emergency Medicine

## 2017-11-11 ENCOUNTER — Encounter (HOSPITAL_COMMUNITY): Payer: Self-pay

## 2017-11-11 DIAGNOSIS — M545 Low back pain, unspecified: Secondary | ICD-10-CM

## 2017-11-11 DIAGNOSIS — O2341 Unspecified infection of urinary tract in pregnancy, first trimester: Secondary | ICD-10-CM | POA: Diagnosis present

## 2017-11-11 DIAGNOSIS — I10 Essential (primary) hypertension: Secondary | ICD-10-CM | POA: Insufficient documentation

## 2017-11-11 DIAGNOSIS — Z3491 Encounter for supervision of normal pregnancy, unspecified, first trimester: Secondary | ICD-10-CM | POA: Diagnosis not present

## 2017-11-11 DIAGNOSIS — Z3A01 Less than 8 weeks gestation of pregnancy: Secondary | ICD-10-CM | POA: Diagnosis not present

## 2017-11-11 LAB — CBC
HCT: 36 % (ref 36.0–46.0)
Hemoglobin: 12.1 g/dL (ref 12.0–15.0)
MCH: 28.5 pg (ref 26.0–34.0)
MCHC: 33.6 g/dL (ref 30.0–36.0)
MCV: 84.7 fL (ref 78.0–100.0)
PLATELETS: 278 10*3/uL (ref 150–400)
RBC: 4.25 MIL/uL (ref 3.87–5.11)
RDW: 13.4 % (ref 11.5–15.5)
WBC: 8.6 10*3/uL (ref 4.0–10.5)

## 2017-11-11 LAB — I-STAT BETA HCG BLOOD, ED (MC, WL, AP ONLY)

## 2017-11-11 NOTE — ED Triage Notes (Signed)
Pt states that for the past two days she has been having back pain all over, denies heavy lifting, denies urinary symptoms. States she is pregnant but does not know how far along or her last LMP

## 2017-11-11 NOTE — Discharge Instructions (Addendum)
As discussed, apply warm compresses to your back, massage, tylenol for pain.  Your blood work was reassuring today.  Normal kidney function.  Your urine showed signs of urinary tract infection that are new from your last urine analysis on the 23rd. Take your entire course of antibiotics even if you feel better.  Follow-up with your OB/GYN for your repeat ultrasound and primary care provider as needed. Return to [facility] emergency department if symptoms worsen, fever, worsening back pain, chills, abdominal pain, vaginal discharge or bleeding or any other new concerning symptoms in the meantime.

## 2017-11-11 NOTE — ED Provider Notes (Signed)
[facility] EMERGENCY DEPARTMENT Provider Note   CSN: [id number] Arrival date & time: 11/11/17  1848     History   Chief Complaint Chief Complaint  Patient presents with  . Back Pain    HPI Brittany [name] is a 37 y.o. female [phone] presenting with worsening lower back pain over the last 2 days. She was seen on 11/07/17 for back pain and had a positive pregnancy urine. They evaluated for ectopic with [place] but could not visualize IUP. Trending quant hcg and repeat US scheduled 2 weeks from now.  She has been prescribed Flexeril muscle relaxant for her symptoms at [facility].  She reports the pain as though" someone is kicking you in the back", she denies any radiation down her lower extremities, no numbness or weakness, no fall injury or trauma. Denies fever, chills, abdominal pain, diarrhea, vaginal discharge or bleeding, no loss of bowel or bladder function.  She denies any urinary symptoms.  States that she is otherwise feeling well but has had an episode of vomiting once this morning.  HPI  Past Medical History:  Diagnosis Date  . Gallstone   . Headache(784.0)   . Hypertension   . Pregnancy induced hypertension    after 2nd delivery  . Vaginal Pap smear, abnormal     Patient Active Problem List   Diagnosis Date Noted  . Encounter for general adult medical examination with abnormal findings 10/04/2017  . Murmur, cardiac 11/30/2016  . Anemia 11/30/2016  . Migraine 03/18/2016  . Subclinical hyperthyroidism 12/09/2015    Past Surgical History:  Procedure Laterality Date  . DILATION AND CURETTAGE OF UTERUS    . LEEP       OB History    Gravida  9   Para  3   Term  2   Preterm  1   AB  5   Living  3     SAB  4   TAB  1   Ectopic  0   Multiple  0   Live Births  3            Home Medications    Prior to Admission medications   Medication Sig Start Date End Date Taking? Authorizing Provider  cephALEXin (KEFLEX) 500 MG capsule  Take 1 capsule (500 mg total) by mouth 2 (two) times daily for 7 days. 11/12/17 11/19/17  Mathews Robinsons B, PA-C  cyclobenzaprine (FLEXERIL) 5 MG tablet Take 1 tablet (5 mg total) by mouth 2 (two) times daily as needed for muscle spasms. 11/07/17   Judeth Horn, NP    Family History Family History  Problem Relation Age of Onset  . Asthma Mother   . Hypertension Mother   . Asthma Maternal Grandmother   . Hypertension Maternal Grandmother     Social History Social History   Tobacco Use  . Smoking status: Never Smoker  . Smokeless tobacco: Never Used  Substance Use Topics  . Alcohol use: No    Alcohol/week: 0.0 oz  . Drug use: No     Allergies   Patient has no known allergies.   Review of Systems Review of Systems  Constitutional: Negative for chills, diaphoresis and fever.  HENT: Negative for congestion, ear pain and sore throat.   Respiratory: Negative for cough, choking, chest tightness, shortness of breath, wheezing and stridor.   Cardiovascular: Negative for chest pain, palpitations and leg swelling.  Gastrointestinal: Positive for nausea and vomiting. Negative for abdominal distention, abdominal pain, blood in stool  and diarrhea.  Genitourinary: Negative for decreased urine volume, difficulty urinating, dysuria, flank pain, frequency, hematuria, pelvic pain, urgency, vaginal bleeding, vaginal discharge and vaginal pain.  Musculoskeletal: Positive for back pain and myalgias. Negative for arthralgias, gait problem, joint swelling, neck pain and neck stiffness.  Skin: Negative for color change, pallor, rash and wound.  Neurological: Negative for dizziness, tremors, weakness, light-headedness, numbness and headaches.  Psychiatric/Behavioral: Negative for behavioral problems.     Physical Exam Updated Vital Signs BP 119/75 (BP Location: Right Arm)   Pulse 68   Temp 98.4 F (36.9 C) (Oral)   Resp 16   Ht  (1.651 m)   Wt 78.5 kg (173 lb)   LMP 09/25/2017    SpO2 100%   BMI 28.79 kg/m   Physical Exam  Constitutional: She appears well-developed and well-nourished. No distress.  Afebrile, nontoxic-appearing, sitting comfortably in chair in no acute distress.  HENT:  Head: Normocephalic and atraumatic.  Eyes: Conjunctivae are normal.  Neck: Normal range of motion. Neck supple.  Cardiovascular: Normal rate, regular rhythm, normal heart sounds and intact distal pulses.  No murmur heard. Pulmonary/Chest: Effort normal and breath sounds normal. No stridor. No respiratory distress. She has no wheezes. She has no rales.  Abdominal: Soft. She exhibits no distension and no mass. There is no tenderness. There is no rebound and no guarding.  Musculoskeletal: Normal range of motion. She exhibits tenderness. She exhibits no edema or deformity.  No midline tenderness palpation of the spine.  Tightness and tenderness palpation of the lower back musculature.  Neurological: She is alert. No sensory deficit. She exhibits normal muscle tone.  5/5 strength in lower extremities bilaterally, normal balance, normal stance and gait.  Skin: Skin is warm and dry. No rash noted. She is not diaphoretic. No erythema. No pallor.  Psychiatric: She has a normal mood and affect.  Nursing note and vitals reviewed.    ED Treatments / Results  Labs (all labs ordered are listed, but only abnormal results are displayed) Labs Reviewed  HCG, QUANTITATIVE, PREGNANCY - Abnormal; Notable for the following components:      Result Value   hCG, Beta Chain, Quant, S 2,945 (*)    All other components within normal limits  URINALYSIS, ROUTINE W REFLEX MICROSCOPIC - Abnormal; Notable for the following components:   APPearance CLOUDY (*)    Ketones, ur 5 (*)    Leukocytes, UA SMALL (*)    Bacteria, UA RARE (*)    All other components within normal limits  I-STAT BETA HCG BLOOD, ED (MC, WL, AP ONLY) - Abnormal; Notable for the following components:   I-stat hCG, quantitative >2,000.0  (*)    All other components within normal limits  URINE CULTURE  BASIC METABOLIC PANEL  CBC    EKG None  Radiology No results found.  Procedures Procedures (including critical care time)  Medications Ordered in ED Medications  cephALEXin (KEFLEX) capsule 500 mg (500 mg Oral Given 11/12/17 0127)     Initial Impression / Assessment and Plan / ED Course  I have reviewed the triage vital signs and the nursing notes.  Pertinent labs & imaging results that were available during my care of the patient were reviewed by me and considered in my medical decision making (see chart for details).    11:25 pm - Went to see patient but she was not in the room.  Patient presents with lower back pain.  No gross neurological deficits and normal neuro exam.  Patient  has no gait abnormality or concern for cauda equina.  No loss of bowel or bladder control, fever, night sweats, weight loss, h/o malignancy, or IVDU.  RICE protocol and pain medications indicated and discussed with patient.   Patient with injury or trauma, no midline tenderness palpation of the spine.  No CVA tenderness.  Abdomen is soft and nontender to palpation.  No vaginal bleeding, pelvic pain or discharge. She is afebrile and nontoxic-appearing hemodynamically stable.  hCG qualitative trending upward with normal expected values. 1308  (11/11/17) 1587  (11/10/17)   402  (11/07/17)  Labs unremarkable with the exeption of U/A with leukocytes and bacteria which is new from last U/A on 11/07/17. Sent out for culture. Will give first dose of keflex while in ER.  Discharge home with antibiotics, symptomatic relief and close follow-up with OB/GYN and PCP.   Discussed return precautions and patient understands and agrees with discharge plan.  Final Clinical Impressions(s) / ED Diagnoses   Final diagnoses:  Acute bilateral low back pain without sciatica  UTI (urinary tract infection) in pregnancy in first trimester    ED Discharge  Orders        Ordered    cephALEXin (KEFLEX) 500 MG capsule  2 times daily     11/12/17 0100       Georgiana Shore, PA-C 11/12/17 0133    Doug Sou, MD 11/13/17 [phone]

## 2017-11-12 LAB — URINALYSIS, ROUTINE W REFLEX MICROSCOPIC
Bilirubin Urine: NEGATIVE
Glucose, UA: NEGATIVE mg/dL
HGB URINE DIPSTICK: NEGATIVE
Ketones, ur: 5 mg/dL — AB
NITRITE: NEGATIVE
PH: 5 (ref 5.0–8.0)
Protein, ur: NEGATIVE mg/dL
SPECIFIC GRAVITY, URINE: 1.024 (ref 1.005–1.030)

## 2017-11-12 LAB — BASIC METABOLIC PANEL
Anion gap: 7 (ref 5–15)
BUN: 6 mg/dL (ref 6–20)
CALCIUM: 9.1 mg/dL (ref 8.9–10.3)
CHLORIDE: 104 mmol/L (ref 101–111)
CO2: 24 mmol/L (ref 22–32)
CREATININE: 0.84 mg/dL (ref 0.44–1.00)
GFR calc Af Amer: >60 mL/min (ref >60)
GFR calc non Af Amer: >60 mL/min (ref >60)
GLUCOSE: 94 mg/dL (ref 65–99)
Potassium: 3.5 mmol/L (ref 3.5–5.1)
Sodium: 135 mmol/L (ref 135–145)

## 2017-11-12 LAB — HCG, QUANTITATIVE, PREGNANCY: hCG, Beta Chain, Quant, S: 2945 m[IU]/mL — ABNORMAL HIGH (ref <5)

## 2017-11-12 MED ORDER — CEPHALEXIN 250 MG PO CAPS
500.0000 mg | ORAL_CAPSULE | Freq: Once | ORAL | Status: AC
  Administered 2017-11-12: 500 mg via ORAL
  Filled 2017-11-12: qty 2

## 2017-11-12 MED ORDER — CEPHALEXIN 500 MG PO CAPS: 500.0000 mg | ORAL_CAPSULE | Freq: Two times a day (BID) | ORAL | 0 refills | Status: AC

## 2017-11-13 ENCOUNTER — Ambulatory Visit: Payer: No Typology Code available for payment source

## 2017-11-13 LAB — URINE CULTURE

## 2017-11-15 ENCOUNTER — Ambulatory Visit: Payer: No Typology Code available for payment source | Admitting: Cardiology

## 2017-11-21 ENCOUNTER — Ambulatory Visit (HOSPITAL_COMMUNITY)
Admission: RE | Admit: 2017-11-21 | Discharge: 2017-11-21 | Disposition: A | Discharge status: Home / Self Care | Payer: No Typology Code available for payment source | Source: Ambulatory Visit | Attending: Advanced Practice Midwife | Admitting: Advanced Practice Midwife

## 2017-11-21 ENCOUNTER — Encounter: Payer: Self-pay | Admitting: General Practice

## 2017-11-21 ENCOUNTER — Ambulatory Visit (INDEPENDENT_AMBULATORY_CARE_PROVIDER_SITE_OTHER): Payer: No Typology Code available for payment source | Admitting: General Practice

## 2017-11-21 DIAGNOSIS — Z712 Person consulting for explanation of examination or test findings: Secondary | ICD-10-CM

## 2017-11-21 DIAGNOSIS — O3680X0 Pregnancy with inconclusive fetal viability, not applicable or unspecified: Secondary | ICD-10-CM

## 2017-11-21 DIAGNOSIS — Z3A01 Less than 8 weeks gestation of pregnancy: Secondary | ICD-10-CM | POA: Diagnosis not present

## 2017-11-21 DIAGNOSIS — O283 Abnormal ultrasonic finding on antenatal screening of mother: Secondary | ICD-10-CM | POA: Diagnosis present

## 2017-11-21 NOTE — Progress Notes (Signed)
Patient here for viability results today. Reviewed ultrasound with Judeth Horn who finds living IUP- patient should begin prenatal care.  Informed patient of results, reviewed dating, & provided pictures. Recommended she begin OB care. Patient verbalized understanding to all & had no questions.

## 2017-11-30 NOTE — Progress Notes (Signed)
Chart reviewed for nurse visit. Agree with plan of care.   Judeth Horn, NP 11/30/2017 11:49 AM

## 2017-12-06 ENCOUNTER — Other Ambulatory Visit: Payer: Self-pay | Admitting: Certified Nurse Midwife

## 2017-12-06 ENCOUNTER — Telehealth: Payer: Self-pay

## 2017-12-06 DIAGNOSIS — O219 Vomiting of pregnancy, unspecified: Secondary | ICD-10-CM

## 2017-12-06 MED ORDER — PROMETHAZINE HCL 12.5 MG PO TABS: 12.5000 mg | ORAL_TABLET | Freq: Four times a day (QID) | ORAL | 0 refills | Status: DC | PRN

## 2017-12-06 NOTE — Progress Notes (Signed)
Phenergan sent to pharmacy for N&V

## 2017-12-06 NOTE — Telephone Encounter (Signed)
Pt has NOB appt next week with you requesting Rx for Nausea and Vomiting  Pharmacy is [facility] on [address]

## 2017-12-06 NOTE — Telephone Encounter (Signed)
Phenergan was sent to the pharmacy for her to take.   Thank you R.Fe Okubo CNM

## 2017-12-07 ENCOUNTER — Encounter (HOSPITAL_COMMUNITY): Payer: Self-pay | Admitting: *Deleted

## 2017-12-07 ENCOUNTER — Inpatient Hospital Stay (HOSPITAL_COMMUNITY)
Admission: AD | Admit: 2017-12-07 | Discharge: 2017-12-07 | Disposition: A | Discharge status: Home / Self Care | Payer: No Typology Code available for payment source | Source: Ambulatory Visit | Attending: Obstetrics and Gynecology | Admitting: Obstetrics and Gynecology

## 2017-12-07 DIAGNOSIS — Z3A08 8 weeks gestation of pregnancy: Secondary | ICD-10-CM | POA: Insufficient documentation

## 2017-12-07 DIAGNOSIS — R109 Unspecified abdominal pain: Secondary | ICD-10-CM | POA: Insufficient documentation

## 2017-12-07 DIAGNOSIS — O26891 Other specified pregnancy related conditions, first trimester: Secondary | ICD-10-CM

## 2017-12-07 DIAGNOSIS — Z3491 Encounter for supervision of normal pregnancy, unspecified, first trimester: Secondary | ICD-10-CM

## 2017-12-07 DIAGNOSIS — O26899 Other specified pregnancy related conditions, unspecified trimester: Secondary | ICD-10-CM

## 2017-12-07 DIAGNOSIS — O219 Vomiting of pregnancy, unspecified: Secondary | ICD-10-CM

## 2017-12-07 DIAGNOSIS — O210 Mild hyperemesis gravidarum: Secondary | ICD-10-CM | POA: Insufficient documentation

## 2017-12-07 LAB — URINALYSIS, ROUTINE W REFLEX MICROSCOPIC
Bacteria, UA: NONE SEEN
Bilirubin Urine: NEGATIVE
Glucose, UA: NEGATIVE mg/dL
KETONES UR: NEGATIVE mg/dL
Leukocytes, UA: NEGATIVE
Nitrite: NEGATIVE
PH: 5 (ref 5.0–8.0)
PROTEIN: NEGATIVE mg/dL
Specific Gravity, Urine: 1.024 (ref 1.005–1.030)

## 2017-12-07 LAB — CBC
HEMATOCRIT: 36.7 % (ref 36.0–46.0)
HEMOGLOBIN: 12.1 g/dL (ref 12.0–15.0)
MCH: 28.1 pg (ref 26.0–34.0)
MCHC: 33 g/dL (ref 30.0–36.0)
MCV: 85.2 fL (ref 78.0–100.0)
Platelets: 276 10*3/uL (ref 150–400)
RBC: 4.31 MIL/uL (ref 3.87–5.11)
RDW: 13.5 % (ref 11.5–15.5)
WBC: 7.8 10*3/uL (ref 4.0–10.5)

## 2017-12-07 MED ORDER — ONDANSETRON 8 MG PO TBDP
8.0000 mg | ORAL_TABLET | Freq: Once | ORAL | Status: AC
  Administered 2017-12-07: 8 mg via ORAL
  Filled 2017-12-07: qty 1

## 2017-12-07 MED ORDER — METOCLOPRAMIDE HCL 10 MG PO TABS: 10.0000 mg | ORAL_TABLET | Freq: Four times a day (QID) | ORAL | 0 refills | Status: DC

## 2017-12-07 NOTE — Discharge Instructions (Signed)
Morning Sickness °Morning sickness is when you feel sick to your stomach (nauseous) during pregnancy. This nauseous feeling may or may not come with vomiting. It often occurs in the morning but can be a problem any time of day. Morning sickness is most common during the first trimester, but it may continue throughout pregnancy. While morning sickness is unpleasant, it is usually harmless unless you develop severe and continual vomiting (hyperemesis gravidarum). This condition requires more intense treatment. °What are the causes? °The cause of morning sickness is not completely known but seems to be related to normal hormonal changes that occur in pregnancy. °What increases the risk? °You are at greater risk if you: °· Experienced nausea or vomiting before your pregnancy. °· Had morning sickness during a previous pregnancy. °· Are pregnant with more than one baby, such as twins. ° °How is this treated? °Do not use any medicines (prescription, over-the-counter, or herbal) for morning sickness without first talking to your health care provider. Your health care provider may prescribe or recommend: °· Vitamin B6 supplements. °· Anti-nausea medicines. °· The herbal medicine ginger. ° °Follow these instructions at home: °· Only take over-the-counter or prescription medicines as directed by your health care provider. °· Taking multivitamins before getting pregnant can prevent or decrease the severity of morning sickness in most women. °· Eat a piece of dry toast or unsalted crackers before getting out of bed in the morning. °· Eat five or six small meals a day. °· Eat dry and bland foods (rice, baked potato). Foods high in carbohydrates are often helpful. °· Do not drink liquids with your meals. Drink liquids between meals. °· Avoid greasy, fatty, and spicy foods. °· Get someone to cook for you if the smell of any food causes nausea and vomiting. °· If you feel nauseous after taking prenatal vitamins, take the vitamins at  night or with a snack. °· Snack on protein foods (nuts, yogurt, cheese) between meals if you are hungry. °· Eat unsweetened gelatins for desserts. °· Wearing an acupressure wristband (worn for sea sickness) may be helpful. °· Acupuncture may be helpful. °· Do not smoke. °· Get a humidifier to keep the air in your house free of odors. °· Get plenty of fresh air. °Contact a health care provider if: °· Your home remedies are not working, and you need medicine. °· You feel dizzy or lightheaded. °· You are losing weight. °Get help right away if: °· You have persistent and uncontrolled nausea and vomiting. °· You pass out (faint). °This information is not intended to replace advice given to you by your health care provider. Make sure you discuss any questions you have with your health care provider. °Document Released: 08/25/2006 Document Revised: 12/10/2015 Document Reviewed: 12/19/2012 °Elsevier Interactive Patient Education © 2017 [employer] Inc[employer] ° °

## 2017-12-07 NOTE — MAU Provider Note (Signed)
History     CSN: [id number]  Arrival date and time: 12/07/17 4098   First Provider Initiated Contact with Patient 12/07/17 1012     Chief Complaint  Patient presents with  . Nausea  . Back Pain   HPI Brittany [name] is a 37 y.o. [phone] at [redacted]w[redacted]d who presents with nausea and vomiting. She states this has been ongoing for a week but gradually getting worse. She was prescribed phenergan yesterday but states it isn't working. She also reports lower back and lower abdominal cramping. She rates the pain a 8/10 and has not tried anything for the pain. She denies any dysuria, vaginal bleeding or discharge.   OB History    Gravida  9   Para  3   Term  2   Preterm  1   AB  5   Living  3     SAB  4   TAB  1   Ectopic  0   Multiple  0   Live Births  3           Past Medical History:  Diagnosis Date  . Gallstone   . Headache(784.0)   . Hypertension   . Pregnancy induced hypertension    after 2nd delivery  . Vaginal Pap smear, abnormal     Past Surgical History:  Procedure Laterality Date  . DILATION AND CURETTAGE OF UTERUS    . LEEP      Family History  Problem Relation Age of Onset  . Asthma Mother   . Hypertension Mother   . Asthma Maternal Grandmother   . Hypertension Maternal Grandmother     Social History   Tobacco Use  . Smoking status: Never Smoker  . Smokeless tobacco: Never Used  Substance Use Topics  . Alcohol use: No    Alcohol/week: 0.0 oz  . Drug use: No    Allergies: No Known Allergies  Medications Prior to Admission  Medication Sig Dispense Refill Last Dose  . cyclobenzaprine (FLEXERIL) 5 MG tablet Take 1 tablet (5 mg total) by mouth 2 (two) times daily as needed for muscle spasms. 10 tablet 0   . promethazine (PHENERGAN) 12.5 MG tablet Take 1 tablet (12.5 mg total) by mouth every 6 (six) hours as needed for up to 18 days for nausea or vomiting. 30 tablet 0     Review of Systems  Constitutional: Negative.  Negative for  fatigue and fever.  HENT: Negative.   Respiratory: Negative.  Negative for shortness of breath.   Cardiovascular: Negative.  Negative for chest pain.  Gastrointestinal: Positive for nausea. Negative for abdominal pain, constipation, diarrhea and vomiting.  Genitourinary: Negative.  Negative for dysuria, vaginal bleeding and vaginal discharge.  Neurological: Negative.  Negative for dizziness and headaches.   Physical Exam   Blood pressure 115/78, pulse 87, temperature 98.9 F (37.2 C), resp. rate 18, height  (1.651 m), weight 167 lb (75.8 kg), last menstrual period 09/25/2017, currently breastfeeding.  Physical Exam  Nursing note and vitals reviewed. Constitutional: She is oriented to person, place, and time. She appears well-developed and well-nourished. No distress.  HENT:  Head: Normocephalic.  Eyes: Pupils are equal, round, and reactive to light.  Cardiovascular: Normal rate, regular rhythm and normal heart sounds.  Respiratory: Effort normal and breath sounds normal. No respiratory distress.  GI: Soft. Bowel sounds are normal. She exhibits no distension. There is no tenderness.  Neurological: She is alert and oriented to person, place, and time.  Skin: Skin  is warm and dry.  Psychiatric: She has a normal mood and affect. Her behavior is normal. Judgment and thought content normal.    MAU Course  Procedures Results for orders placed or performed during the hospital encounter of 12/07/17 (from the past 24 hour(s))  Urinalysis, Routine w reflex microscopic     Status: Abnormal   Collection Time: 12/07/17  9:50 AM  Result Value Ref Range   Color, Urine AMBER (A) YELLOW   APPearance CLEAR CLEAR   Specific Gravity, Urine 1.024 1.005 - 1.030   pH 5.0 5.0 - 8.0   Glucose, UA NEGATIVE NEGATIVE mg/dL   Hgb urine dipstick SMALL (A) NEGATIVE   Bilirubin Urine NEGATIVE NEGATIVE   Ketones, ur NEGATIVE NEGATIVE mg/dL   Protein, ur NEGATIVE NEGATIVE mg/dL   Nitrite NEGATIVE  NEGATIVE   Leukocytes, UA NEGATIVE NEGATIVE   RBC / HPF 6-10 0 - 5 RBC/hpf   WBC, UA 0-5 0 - 5 WBC/hpf   Bacteria, UA NONE SEEN NONE SEEN   Squamous Epithelial / LPF 0-5 0 - 5   Mucus PRESENT    MDM UA CBC Zofran  ODT per patient request Labs ordered and reviewed Limited bedside u/s performed and confirmed cardiac activity, FHR 130s-140s  Assessment and Plan   1. Abdominal pain affecting pregnancy   2. [redacted] weeks gestation of pregnancy   3. Nausea and vomiting during pregnancy prior to [redacted] weeks gestation   4. Normal intrauterine pregnancy on prenatal ultrasound in first trimester    -Discharge home in stable condition -Rx for reglan sent to patient's pharmacy -Nausea and vomiting precautions discussed -Patient advised to follow-up with [facility] as scheduled to start prenatal care -Patient may return to MAU as needed or if her condition were to change or worsen  Rolm Bookbinder CNM 12/07/2017, 10:12 AM   Allergies as of 12/07/2017   No Known Allergies     Medication List    TAKE these medications   cyclobenzaprine 5 MG tablet Commonly known as:  FLEXERIL Take 1 tablet (5 mg total) by mouth 2 (two) times daily as needed for muscle spasms.   metoCLOPramide 10 MG tablet Commonly known as:  REGLAN Take 1 tablet (10 mg total) by mouth every 6 (six) hours.   prenatal multivitamin Tabs tablet Take 1 tablet by mouth daily at 12 noon.   promethazine 12.5 MG tablet Commonly known as:  PHENERGAN Take 1 tablet (12.5 mg total) by mouth every 6 (six) hours as needed for up to 18 days for nausea or vomiting.

## 2017-12-07 NOTE — MAU Note (Signed)
Pt c/o nausea for the past few days and back and abd pain. Denies and vaginal discharge or bleeding.

## 2017-12-12 ENCOUNTER — Ambulatory Visit (INDEPENDENT_AMBULATORY_CARE_PROVIDER_SITE_OTHER): Payer: No Typology Code available for payment source | Admitting: Certified Nurse Midwife

## 2017-12-12 ENCOUNTER — Encounter: Payer: Self-pay | Admitting: Certified Nurse Midwife

## 2017-12-12 VITALS — BP 129/90 | HR 89 | Wt 169.6 lb

## 2017-12-12 DIAGNOSIS — O0990 Supervision of high risk pregnancy, unspecified, unspecified trimester: Secondary | ICD-10-CM | POA: Insufficient documentation

## 2017-12-12 DIAGNOSIS — Z3401 Encounter for supervision of normal first pregnancy, first trimester: Secondary | ICD-10-CM

## 2017-12-12 DIAGNOSIS — R011 Cardiac murmur, unspecified: Secondary | ICD-10-CM

## 2017-12-12 DIAGNOSIS — Z8639 Personal history of other endocrine, nutritional and metabolic disease: Secondary | ICD-10-CM

## 2017-12-12 DIAGNOSIS — O219 Vomiting of pregnancy, unspecified: Secondary | ICD-10-CM

## 2017-12-12 DIAGNOSIS — O0991 Supervision of high risk pregnancy, unspecified, first trimester: Secondary | ICD-10-CM

## 2017-12-12 DIAGNOSIS — O10911 Unspecified pre-existing hypertension complicating pregnancy, first trimester: Secondary | ICD-10-CM

## 2017-12-12 MED ORDER — DOXYLAMINE-PYRIDOXINE ER 20-20 MG PO TBCR
1.0000 | EXTENDED_RELEASE_TABLET | Freq: Two times a day (BID) | ORAL | 6 refills | Status: DC
Start: 2017-12-12 — End: 2018-04-05

## 2017-12-12 NOTE — Progress Notes (Signed)
Subjective:   Brittany [name] is a 37 y.o. [phone] at 31w1dby early ultrasound '@6wks'  being seen today for her first obstetrical visit.  Her obstetrical history is significant for Hx of multiple SABS, hx of IUGR, Hx of PIH>CHTN. Patient does intend to breast feed. Pregnancy history fully reviewed.  Patient reports fatigue, nausea, no bleeding, no contractions, no cramping, no leaking and vomiting.  HISTORY: OB History  Gravida Para Term Preterm AB Living  '9 3 2 1 5 3  ' SAB TAB Ectopic Multiple Live Births  4 1 0 0 3    # Outcome Date GA Lbr Len/2nd Weight Sex Delivery Anes PTL Lv  9 Current           8 Term 05/03/16 373w1d0:08 / 00:01 4 lb 12.7 oz (2.175 kg) M Vag-Spont None  LIV     Name: Speedy,BOY Thalia     Apgar1: 8  Apgar5: 9  7 Preterm 04/06/11 3625w6d:25 / 00:08 5 lb 6.6 oz (2.455 kg) F Vag-Spont None  LIV     Complications: PIH (pregnancy induced hypertension)     Name: Umble,GIRL Carnelia     Apgar1: 9  Apgar5: 9  6 TAB           5 SAB           4 SAB           3 SAB           2 SAB           1 Term  [id number]-Spont       Last pap smear was done 10/2016 and was normal  Past Medical History:  Diagnosis Date  . Gallstone   . Headache(784.0)   . Hypertension   . Pregnancy induced hypertension    after 2nd delivery  . Vaginal Pap smear, abnormal    Past Surgical History:  Procedure Laterality Date  . DILATION AND CURETTAGE OF UTERUS    . LEEP     Family History  Problem Relation Age of Onset  . Asthma Mother   . Hypertension Mother   . Asthma Maternal Grandmother   . Hypertension Maternal Grandmother    Social History   Tobacco Use  . Smoking status: Never Smoker  . Smokeless tobacco: Never Used  Substance Use Topics  . Alcohol use: No    Alcohol/week: 0.0 oz  . Drug use: No   No Known Allergies Current Outpatient Medications on File Prior to Visit  Medication Sig Dispense Refill  . metoCLOPramide (REGLAN) 10 MG tablet Take 1 tablet (10 mg  total) by mouth every 6 (six) hours. 30 tablet 0  . Prenatal Vit-Fe Fumarate-FA (PRENATAL MULTIVITAMIN) TABS tablet Take 1 tablet by mouth daily at 12 noon.    . promethazine (PHENERGAN) 12.5 MG tablet Take 1 tablet (12.5 mg total) by mouth every 6 (six) hours as needed for up to 18 days for nausea or vomiting. (Patient not taking: Reported on 12/07/2017) 30 tablet 0   No current facility-administered medications on file prior to visit.     Review of Systems Pertinent items noted in HPI and remainder of comprehensive ROS otherwise negative.  Exam   Vitals:   12/12/17 0834  BP: 129/90  Pulse: 89  Weight: 169 lb 9.6 oz (76.9 kg)   Fetal Heart Rate (bpm): 177; doppler  Pelvis: deferred    System: General: well-developed, well-nourished female in no acute distress  Breast:  normal appearance, no masses or tenderness   Skin: normal coloration and turgor, no rashes   Neurologic: oriented, normal, negative, normal mood   Extremities: normal strength, tone, and muscle mass, ROM of all joints is normal   HEENT PERRLA, extraocular movement intact and sclera clear, anicteric   Mouth/Teeth mucous membranes moist, pharynx normal without lesions and dental hygiene good   Neck supple and no masses   Cardiovascular: regular rate and rhythm   Respiratory:  no respiratory distress, normal breath sounds   Abdomen: soft, non-tender; bowel sounds normal; no masses,  no organomegaly     Assessment:   Pregnancy: [id number] Patient Active Problem List   Diagnosis Date Noted  . Supervision of high risk pregnancy, antepartum 12/12/2017  . Murmur, cardiac 11/30/2016  . Anemia 11/30/2016  . Migraine 03/18/2016  . Subclinical hyperthyroidism 12/09/2015  . Maternal chronic hypertension in first trimester 04/09/2011     Plan:  1. Supervision of high risk pregnancy, antepartum    Vitafol Nano samples given.   - Obstetric Panel, Including HIV - Culture, OB Urine - Cystic Fibrosis Mutation 97 -  Genetic Screening - Protein / creatinine ratio, urine - Comp Met (CMET) - [place] MFM OB DETAIL +14 WK; Future - [place] MFM OB Transvaginal; Future - Vitamin D (25 hydroxy) - Hemoglobin A1c  2. History of hyperthyroidism     - TSH Pregnancy - [place] MFM OB DETAIL +14 WK; Future - [place] MFM OB Transvaginal; Future  3. Maternal chronic hypertension in first trimester     - Protein / creatinine ratio, urine - Comp Met (CMET) - Ambulatory referral to Cardiology - [place] MFM OB DETAIL +14 WK; Future - [place] MFM OB Transvaginal; Future  4. Encounter for supervision of normal first pregnancy in first trimester     Changed to High Risk d/t hx of CHTN, hyperthyroidism, preterm birth  49. Murmur, cardiac      - Ambulatory referral to Cardiology  6. Nausea and vomiting in pregnancy     Bonjesta samples given - Doxylamine-Pyridoxine ER (BONJESTA) 20-20 MG TBCR; Take 1 tablet by mouth 2 (two) times daily.  Dispense: 60 tablet; Refill: 6   Initial labs drawn. Continue prenatal vitamins. Genetic Screening discussed, NIPS: ordered. Ultrasound discussed; fetal anatomic survey: ordered. Problem list reviewed and updated. The nature of [place] with multiple MDs and other Advanced Practice Providers was explained to patient; also emphasized that residents, students are part of our team. Routine obstetric precautions reviewed. Return in about 1 month (around 01/09/2018) for [facility], Needs to see FP MD here.     Kandis Cocking, [facility], [place]

## 2017-12-13 LAB — COMPREHENSIVE METABOLIC PANEL
ALK PHOS: 62 IU/L (ref 39–117)
ALT: 42 IU/L — AB (ref 0–32)
AST: 23 IU/L (ref 0–40)
Albumin/Globulin Ratio: 1.3 (ref 1.2–2.2)
Albumin: 4.2 g/dL (ref 3.5–5.5)
BILIRUBIN TOTAL: 0.2 mg/dL (ref 0.0–1.2)
BUN/Creatinine Ratio: 15 (ref 9–23)
BUN: 9 mg/dL (ref 6–20)
CHLORIDE: 99 mmol/L (ref 96–106)
CO2: 22 mmol/L (ref 20–29)
Calcium: 9.7 mg/dL (ref 8.7–10.2)
Creatinine, Ser: 0.62 mg/dL (ref 0.57–1.00)
GFR calc Af Amer: 136 mL/min/{1.73_m2} (ref >59)
GFR calc non Af Amer: 118 mL/min/{1.73_m2} (ref >59)
GLUCOSE: 70 mg/dL (ref 65–99)
Globulin, Total: 3.2 g/dL (ref 1.5–4.5)
Potassium: 4.1 mmol/L (ref 3.5–5.2)
Sodium: 137 mmol/L (ref 134–144)
Total Protein: 7.4 g/dL (ref 6.0–8.5)

## 2017-12-13 LAB — OBSTETRIC PANEL, INCLUDING HIV
Antibody Screen: NEGATIVE
BASOS ABS: 0 10*3/uL (ref 0.0–0.2)
Basos: 0 %
EOS (ABSOLUTE): 0.1 10*3/uL (ref 0.0–0.4)
EOS: 1 %
HEMOGLOBIN: 11.6 g/dL (ref 11.1–15.9)
HEP B S AG: NEGATIVE
HIV Screen 4th Generation wRfx: NONREACTIVE
Hematocrit: 36.3 % (ref 34.0–46.6)
IMMATURE GRANS (ABS): 0.1 10*3/uL (ref 0.0–0.1)
Immature Granulocytes: 1 %
LYMPHS: 19 %
Lymphocytes Absolute: 1.9 10*3/uL (ref 0.7–3.1)
MCH: 26.8 pg (ref 26.6–33.0)
MCHC: 32 g/dL (ref 31.5–35.7)
MCV: 84 fL (ref 79–97)
MONOS ABS: 0.8 10*3/uL (ref 0.1–0.9)
Monocytes: 8 %
NEUTROS PCT: 71 %
Neutrophils Absolute: 7.3 10*3/uL — ABNORMAL HIGH (ref 1.4–7.0)
Platelets: 293 10*3/uL (ref 150–450)
RBC: 4.33 x10E6/uL (ref 3.77–5.28)
RDW: 14.3 % (ref 12.3–15.4)
RH TYPE: POSITIVE
RPR Ser Ql: NONREACTIVE
Rubella Antibodies, IGG: 2.56 index (ref >0.99)
WBC: 10.2 10*3/uL (ref 3.4–10.8)

## 2017-12-13 LAB — VITAMIN D 25 HYDROXY (VIT D DEFICIENCY, FRACTURES): Vit D, 25-Hydroxy: 23.9 ng/mL — ABNORMAL LOW (ref 30.0–100.0)

## 2017-12-13 LAB — HEMOGLOBIN A1C
ESTIMATED AVERAGE GLUCOSE: 114 mg/dL
HEMOGLOBIN A1C: 5.6 % (ref 4.8–5.6)

## 2017-12-13 LAB — PROTEIN / CREATININE RATIO, URINE
CREATININE, UR: 130.7 mg/dL
PROTEIN/CREAT RATIO: 98 mg/g{creat} (ref 0–200)
Protein, Ur: 12.8 mg/dL

## 2017-12-13 LAB — TSH PREGNANCY: TSH PREGNANCY: 0.238 u[IU]/mL — AB (ref 0.450–4.500)

## 2017-12-14 ENCOUNTER — Other Ambulatory Visit: Payer: Self-pay | Admitting: Certified Nurse Midwife

## 2017-12-14 DIAGNOSIS — O0990 Supervision of high risk pregnancy, unspecified, unspecified trimester: Secondary | ICD-10-CM

## 2017-12-14 DIAGNOSIS — R740 Nonspecific elevation of levels of transaminase and lactic acid dehydrogenase [LDH]: Secondary | ICD-10-CM

## 2017-12-14 DIAGNOSIS — E559 Vitamin D deficiency, unspecified: Secondary | ICD-10-CM | POA: Insufficient documentation

## 2017-12-14 DIAGNOSIS — R7401 Elevation of levels of liver transaminase levels: Secondary | ICD-10-CM | POA: Insufficient documentation

## 2017-12-14 MED ORDER — VITAMIN D (ERGOCALCIFEROL) 1.25 MG (50000 UNIT) PO CAPS: 50000.0000 [IU] | ORAL_CAPSULE | ORAL | 2 refills | Status: DC

## 2017-12-16 LAB — CULTURE, OB URINE

## 2017-12-16 LAB — URINE CULTURE, OB REFLEX

## 2017-12-18 LAB — CYSTIC FIBROSIS MUTATION 97: Interpretation: NOT DETECTED

## 2017-12-19 ENCOUNTER — Other Ambulatory Visit: Payer: Self-pay | Admitting: Certified Nurse Midwife

## 2017-12-19 ENCOUNTER — Telehealth: Payer: Self-pay | Admitting: *Deleted

## 2017-12-19 DIAGNOSIS — O2341 Unspecified infection of urinary tract in pregnancy, first trimester: Secondary | ICD-10-CM | POA: Insufficient documentation

## 2017-12-19 DIAGNOSIS — O0990 Supervision of high risk pregnancy, unspecified, unspecified trimester: Secondary | ICD-10-CM

## 2017-12-19 MED ORDER — AMOXICILLIN-POT CLAVULANATE 875-125 MG PO TABS: 1.0000 | ORAL_TABLET | Freq: Two times a day (BID) | ORAL | 0 refills | Status: DC

## 2017-12-19 NOTE — Telephone Encounter (Signed)
Pt called to office for lab results. Pt made aware of UTI per lab results and Rx info.

## 2017-12-21 ENCOUNTER — Encounter: Payer: Self-pay | Admitting: *Deleted

## 2017-12-22 ENCOUNTER — Encounter (HOSPITAL_COMMUNITY): Payer: Self-pay | Admitting: *Deleted

## 2017-12-22 ENCOUNTER — Other Ambulatory Visit: Payer: Self-pay | Admitting: Certified Nurse Midwife

## 2017-12-22 ENCOUNTER — Inpatient Hospital Stay (HOSPITAL_COMMUNITY)
Admission: AD | Admit: 2017-12-22 | Discharge: 2017-12-22 | Disposition: A | Discharge status: Home / Self Care | Payer: Medicaid Other | Source: Ambulatory Visit | Attending: Obstetrics & Gynecology | Admitting: Obstetrics & Gynecology

## 2017-12-22 DIAGNOSIS — R1030 Lower abdominal pain, unspecified: Secondary | ICD-10-CM | POA: Diagnosis present

## 2017-12-22 DIAGNOSIS — R102 Pelvic and perineal pain: Secondary | ICD-10-CM | POA: Insufficient documentation

## 2017-12-22 DIAGNOSIS — M549 Dorsalgia, unspecified: Secondary | ICD-10-CM

## 2017-12-22 DIAGNOSIS — M7918 Myalgia, other site: Secondary | ICD-10-CM | POA: Diagnosis not present

## 2017-12-22 DIAGNOSIS — Z3A10 10 weeks gestation of pregnancy: Secondary | ICD-10-CM | POA: Diagnosis not present

## 2017-12-22 DIAGNOSIS — O2341 Unspecified infection of urinary tract in pregnancy, first trimester: Secondary | ICD-10-CM

## 2017-12-22 DIAGNOSIS — O26891 Other specified pregnancy related conditions, first trimester: Secondary | ICD-10-CM | POA: Diagnosis not present

## 2017-12-22 DIAGNOSIS — O0990 Supervision of high risk pregnancy, unspecified, unspecified trimester: Secondary | ICD-10-CM

## 2017-12-22 LAB — CBC
HEMATOCRIT: 35.2 % — AB (ref 36.0–46.0)
Hemoglobin: 11.8 g/dL — ABNORMAL LOW (ref 12.0–15.0)
MCH: 28.1 pg (ref 26.0–34.0)
MCHC: 33.5 g/dL (ref 30.0–36.0)
MCV: 83.8 fL (ref 78.0–100.0)
PLATELETS: 257 10*3/uL (ref 150–400)
RBC: 4.2 MIL/uL (ref 3.87–5.11)
RDW: 14 % (ref 11.5–15.5)
WBC: 9.8 10*3/uL (ref 4.0–10.5)

## 2017-12-22 LAB — URINALYSIS, ROUTINE W REFLEX MICROSCOPIC
Bilirubin Urine: NEGATIVE
GLUCOSE, UA: NEGATIVE mg/dL
HGB URINE DIPSTICK: NEGATIVE
Ketones, ur: NEGATIVE mg/dL
Leukocytes, UA: NEGATIVE
Nitrite: NEGATIVE
PROTEIN: NEGATIVE mg/dL
SPECIFIC GRAVITY, URINE: 1.021 (ref 1.005–1.030)
pH: 6 (ref 5.0–8.0)

## 2017-12-22 MED ORDER — CYCLOBENZAPRINE HCL 5 MG PO TABS
5.0000 mg | ORAL_TABLET | Freq: Once | ORAL | Status: AC
  Administered 2017-12-22: 5 mg via ORAL
  Filled 2017-12-22: qty 1

## 2017-12-22 MED ORDER — CYCLOBENZAPRINE HCL 5 MG PO TABS: 5.0000 mg | ORAL_TABLET | Freq: Three times a day (TID) | ORAL | 0 refills | Status: DC | PRN

## 2017-12-22 MED ORDER — NITROFURANTOIN MONOHYD MACRO 100 MG PO CAPS: 100.0000 mg | ORAL_CAPSULE | Freq: Two times a day (BID) | ORAL | 1 refills | Status: DC

## 2017-12-22 MED ORDER — ONDANSETRON 4 MG PO TBDP: 4.0000 mg | ORAL_TABLET | Freq: Four times a day (QID) | ORAL | 0 refills | Status: DC | PRN

## 2017-12-22 NOTE — MAU Provider Note (Signed)
History     CSN: [id number]  Arrival date and time: 12/22/17 0945   First Provider Initiated Contact with Patient 12/22/17 1026      Chief Complaint  Patient presents with  . Abdominal Pain  . Back Pain   [name] is an A.A G/P: [phone] d female that present today with Nausea, bilateral lower back with CVA tendernes and lower abdominal pelvic pain since 0100 12/22/2017. She states that the pain has become progressively worse through the morning, rating it a 9/10. Pt. States that this "feels different from the other ones". She complains of some intermittent  bilateral flank discomfort while micturating. She is currently taking Augmentin (800/125) for a previous occurence of a UTI. She denies any recent illness, fever, chills, dysuria, constipation, hematochezia, or dysuria.      OB History    Gravida  9   Para  3   Term  2   Preterm  1   AB  5   Living  3     SAB  4   TAB  1   Ectopic  0   Multiple  0   Live Births  3           Past Medical History:  Diagnosis Date  . Gallstone   . Headache(784.0)   . Hypertension   . Pregnancy induced hypertension    after 2nd delivery  . Vaginal Pap smear, abnormal     Past Surgical History:  Procedure Laterality Date  . DILATION AND CURETTAGE OF UTERUS    . LEEP      Family History  Problem Relation Age of Onset  . Asthma Mother   . Hypertension Mother   . Asthma Maternal Grandmother   . Hypertension Maternal Grandmother     Social History   Tobacco Use  . Smoking status: Never Smoker  . Smokeless tobacco: Never Used  Substance Use Topics  . Alcohol use: No    Alcohol/week: 0.0 oz  . Drug use: No    Allergies: No Known Allergies  Medications Prior to Admission  Medication Sig Dispense Refill Last Dose  . amoxicillin-clavulanate (AUGMENTIN) 875-125 MG tablet Take 1 tablet by mouth 2 (two) times daily. 14 tablet 0   . Doxylamine-Pyridoxine ER (BONJESTA) 20-20 MG TBCR Take 1 tablet by  mouth 2 (two) times daily. 60 tablet 6   . metoCLOPramide (REGLAN) 10 MG tablet Take 1 tablet (10 mg total) by mouth every 6 (six) hours. 30 tablet 0 Taking  . Prenatal Vit-Fe Fumarate-FA (PRENATAL MULTIVITAMIN) TABS tablet Take 1 tablet by mouth daily at 12 noon.   Taking  . promethazine (PHENERGAN) 12.5 MG tablet Take 1 tablet (12.5 mg total) by mouth every 6 (six) hours as needed for up to 18 days for nausea or vomiting. (Patient not taking: Reported on 12/07/2017) 30 tablet 0 Not Taking  . Vitamin D, Ergocalciferol, (DRISDOL) 50000 units CAPS capsule Take 1 capsule (50,000 Units total) by mouth every 7 (seven) days. 30 capsule 2     Review of Systems  Constitutional: Negative for chills, diaphoresis, fatigue and fever.  HENT: Negative.   Respiratory: Negative.   Cardiovascular: Positive for leg swelling (pt. stated that her foot and knee was swollen last night and that she couldnt "walk or put pressure on it".  she states that it is not a bad right now, but still hurts.). Negative for chest pain and palpitations.  Gastrointestinal: Positive for abdominal pain and nausea. Negative for abdominal  distention, blood in stool, constipation, diarrhea, rectal pain and vomiting.  Genitourinary: Positive for flank pain and pelvic pain. Negative for difficulty urinating, dysuria, frequency, urgency, vaginal bleeding and vaginal pain.  Musculoskeletal: Positive for back pain and myalgias.  Skin: Negative.   Neurological: Negative.   Hematological: Negative for adenopathy.   Physical Exam   Blood pressure 129/81, pulse 91, temperature 97.7 F (36.5 C), temperature source Oral, resp. rate 18, last menstrual period 09/25/2017, SpO2 99 %, currently breastfeeding.  Physical Exam  Constitutional: She is oriented to person, place, and time. She appears well-nourished.  HENT:  Head: Normocephalic.  Eyes: EOM are normal.  Cardiovascular: Normal rate, regular rhythm and intact distal pulses.  Murmur: Pt.  hx of murmmur, unable to appreciate duriong exam. Respiratory: Effort normal and breath sounds normal.  GI: Soft. She exhibits no distension and no mass. There is tenderness. There is no rebound and no guarding.  Musculoskeletal: She exhibits no edema.  BLE symetrical, neg. Edema, Erythema, Calor. Skin color appropriate for ethnicity. Femoral, popliteal, posterior tibial pulses intact and palpable. Capillary refill <3 sec BLE. Valgus and McMurray neg. Patellar pain elicited on palpation.  Neurological: She is alert and oriented to person, place, and time.  Skin: Skin is warm and dry. She is not diaphoretic. No erythema. No pallor.    MAU Course  Procedures  MDM UC was attained and identified enterobacter, suceptible to nitrofurantoin. Will d/c Augmentin and switch pt. Macrobid. Utrasound was performed by Georgia Dommaria Williams, CNM, and fetal movement and HR observed. Pt. Has a Hx of LBP and tx with success with cyclobenzaprine. Will trial and evaluate.  Assessment and Plan  Musculoskeletal Pain Pelvic pain associated with pregnancy  Jahir Halt Winona LegatoM Wolfgang Finigan, FNP (Student) 12/22/2017, 11:16 AM

## 2017-12-22 NOTE — Discharge Instructions (Signed)
Back Pain, Adult Back pain is very common. The pain often gets better over time. The cause of back pain is usually not dangerous. Most people can learn to manage their back pain on their own. Follow these instructions at home: Watch your back pain for any changes. The following actions may help to lessen any pain you are feeling:  Stay active. Start with short walks on flat ground if you can. Try to walk farther each day.  Exercise regularly as told by your doctor. Exercise helps your back heal faster. It also helps avoid future injury by keeping your muscles strong and flexible.  Do not sit, drive, or stand in one place for more than 30 minutes.  Do not stay in bed. Resting more than 1-2 days can slow down your recovery.  Be careful when you bend or lift an object. Use good form when lifting: ? Bend at your knees. ? Keep the object close to your body. ? Do not twist.  Sleep on a firm mattress. Lie on your side, and bend your knees. If you lie on your back, put a pillow under your knees.  Take medicines only as told by your doctor.  Put ice on the injured area. ? Put ice in a plastic bag. ? Place a towel between your skin and the bag. ? Leave the ice on for 20 minutes, 2-3 times a day for the first 2-3 days. After that, you can switch between ice and heat packs.  Avoid feeling anxious or stressed. Find good ways to deal with stress, such as exercise.  Maintain a healthy weight. Extra weight puts stress on your back.  Contact a doctor if:  You have pain that does not go away with rest or medicine.  You have worsening pain that goes down into your legs or buttocks.  You have pain that does not get better in one week.  You have pain at night.  You lose weight.  You have a fever or chills. Get help right away if:  You cannot control when you poop (bowel movement) or pee (urinate).  Your arms or legs feel weak.  Your arms or legs lose feeling (numbness).  You feel sick  to your stomach (nauseous) or throw up (vomit).  You have belly (abdominal) pain.  You feel like you may pass out (faint). This information is not intended to replace advice given to you by your health care provider. Make sure you discuss any questions you have with your health care provider. Document Released: 12/21/2007 Document Revised: 12/10/2015 Document Reviewed: 11/05/2013 Elsevier Interactive Patient Education  2018 [employer]  Pregnancy and Urinary Tract Infection What is a urinary tract infection? A urinary tract infection (UTI) is an infection of any part of the urinary tract. This includes the kidneys, the tubes that connect your kidneys to your bladder (ureters), the bladder, and the tube that carries urine out of your body (urethra). These organs make, store, and get rid of urine in the body. A UTI can be a bladder infection (cystitis) or a kidney infection (pyelonephritis). This infection may be caused by fungi, viruses, and bacteria. Bacteria are the most common cause of UTIs. You are more likely to develop a UTI during pregnancy because:  The physical and hormonal changes your body goes through can make it easier for bacteria to get into your urinary tract.  Your growing baby puts pressure on your uterus and can affect urine flow.  Does a UTI place my baby  at risk? An untreated UTI during pregnancy could lead to a kidney infection, which can cause health problems that could affect your baby. Possible complications of an untreated UTI include:  Having your baby before 37 weeks of pregnancy (premature).  Having a baby with a low birth weight.  Developing high blood pressure during pregnancy (preeclampsia).  What are the symptoms of a UTI? Symptoms of a UTI include:  Fever.  Frequent urination or passing small amounts of urine frequently.  Needing to urinate urgently.  Pain or a burning sensation with urination.  Urine that smells bad or unusual.  Cloudy  urine.  Pain in the lower abdomen or back.  Trouble urinating.  Blood in the urine.  Vomiting or being less hungry than normal.  Diarrhea or abdominal pain.  Vaginal discharge.  What are the treatment options for a UTI during pregnancy? Treatment for this condition may include:  Antibiotic medicines that are safe to take during pregnancy.  Other medicines to treat less common causes of UTI.  How can I prevent a UTI?  To prevent a UTI:  Go to the bathroom as soon as you feel the need.  Always wipe from front to back.  Wash your genital area with soap and warm water daily.  Empty your bladder before and after sex.  Wear cotton underwear.  Limit your intake of high sugar foods or drinks, such as regular soda, juice, and sweets.  Drink 6-8 glasses of water daily.  Do not wear tight-fitting pants.  Do not douche or use deodorant sprays.  Do not drink alcohol, caffeine, or carbonated drinks. These can irritate the bladder.  Contact a health care provider if:  Your symptoms do not improve or get worse.  You have a fever after two days of treatment.  You have a rash.  You have abnormal vaginal discharge.  You have back or side pain.  You have chills.  You have nausea and vomiting. Get help right away if: Seek immediate medical care if you are pregnant and:  You feel contractions in your uterus.  You have lower belly pain.  You have a gush of fluid from your vagina.  You have blood in your urine.  You are vomiting and cannot keep down any medicines or water.  This information is not intended to replace advice given to you by your health care provider. Make sure you discuss any questions you have with your health care provider. Document Released: 10/29/2010 Document Revised: 06/17/2016 Document Reviewed: 05/25/2015 Elsevier Interactive Patient Education  2017 [employer]

## 2017-12-22 NOTE — MAU Note (Signed)
Patient was diagnosed with a UTI a few days ago and has been taking ABX for past 3 days.  States still having back pain and lower abdominal pain.  Pt does report vomiting started 2 days ago.  Denies diarrhea.  States she felt hot last night, but did not check temperature.

## 2017-12-22 NOTE — MAU Provider Note (Signed)
Chief Complaint: Abdominal Pain and Back Pain   First Provider Initiated Contact with Patient 12/22/17 1026        SUBJECTIVE HPI: Brittany [name] is a 37 y.o. [id number] at [redacted]w[redacted]d by LMP who presents to maternity admissions reporting lower abdominal pain and also has back pain.  Has been on Cipro for a UTI.  Review of chart shows .Enterococcus, therefore Cipro is not in sensitivity list.   She denies vaginal bleeding, vaginal itching/burning, h/a, dizziness, n/v, or fever/chills.    Abdominal Pain  This is a recurrent problem. The current episode started in the past 7 days. The onset quality is gradual. The problem occurs intermittently. The problem has been unchanged. The pain is located in the RLQ and LLQ. The quality of the pain is cramping and aching. The abdominal pain radiates to the back. Associated symptoms include nausea and vomiting. Pertinent negatives include no constipation, diarrhea, dysuria, fever, headaches or myalgias. The pain is aggravated by certain positions and movement. The pain is relieved by nothing. She has tried nothing for the symptoms.  Back Pain  This is a recurrent problem. The current episode started in the past 7 days. The problem occurs intermittently. The problem is unchanged. The pain is present in the lumbar spine and thoracic spine. The quality of the pain is described as aching. The pain does not radiate. The pain is moderate. The pain is the same all the time. The symptoms are aggravated by position, twisting and bending. Stiffness is present all day. Associated symptoms include abdominal pain. Pertinent negatives include no dysuria, fever or headaches.   RN Note: Patient was diagnosed with a UTI a few days ago and has been taking ABX for past 3 days.  States still having back pain and lower abdominal pain.  Pt does report vomiting started 2 days ago.  Denies diarrhea.  States she felt hot last night, but did not check temperature.    Past Medical History:   Diagnosis Date  . Gallstone   . Headache(784.0)   . Hypertension   . Pregnancy induced hypertension    after 2nd delivery  . Vaginal Pap smear, abnormal    Past Surgical History:  Procedure Laterality Date  . DILATION AND CURETTAGE OF UTERUS    . LEEP     Social History   Socioeconomic History  . Marital status: Married    Spouse name: Not on file  . Number of children: Not on file  . Years of education: Not on file  . Highest education level: Not on file  Occupational History  . Not on file  Social Needs  . Financial resource strain: Not on file  . Food insecurity:    Worry: Not on file    Inability: Not on file  . Transportation needs:    Medical: Not on file    Non-medical: Not on file  Tobacco Use  . Smoking status: Never Smoker  . Smokeless tobacco: Never Used  Substance and Sexual Activity  . Alcohol use: No    Alcohol/week: 0.0 oz  . Drug use: No  . Sexual activity: Yes  Lifestyle  . Physical activity:    Days per week: Not on file    Minutes per session: Not on file  . Stress: Not on file  Relationships  . Social connections:    Talks on phone: Not on file    Gets together: Not on file    Attends religious service: Not on file  Active member of club or organization: Not on file    Attends meetings of clubs or organizations: Not on file    Relationship status: Not on file  . Intimate partner violence:    Fear of current or ex partner: Not on file    Emotionally abused: Not on file    Physically abused: Not on file    Forced sexual activity: Not on file  Other Topics Concern  . Not on file  Social History Narrative  . Not on file   No current facility-administered medications on file prior to encounter.    Current Outpatient Medications on File Prior to Encounter  Medication Sig Dispense Refill  . amoxicillin-clavulanate (AUGMENTIN) 875-125 MG tablet Take 1 tablet by mouth 2 (two) times daily. 14 tablet 0  . Doxylamine-Pyridoxine ER  (BONJESTA) 20-20 MG TBCR Take 1 tablet by mouth 2 (two) times daily. 60 tablet 6  . metoCLOPramide (REGLAN) 10 MG tablet Take 1 tablet (10 mg total) by mouth every 6 (six) hours. 30 tablet 0  . Prenatal Vit-Fe Fumarate-FA (PRENATAL MULTIVITAMIN) TABS tablet Take 1 tablet by mouth daily at 12 noon.    . promethazine (PHENERGAN) 12.5 MG tablet Take 1 tablet (12.5 mg total) by mouth every 6 (six) hours as needed for up to 18 days for nausea or vomiting. (Patient not taking: Reported on 12/07/2017) 30 tablet 0  . Vitamin D, Ergocalciferol, (DRISDOL) 50000 units CAPS capsule Take 1 capsule (50,000 Units total) by mouth every 7 (seven) days. 30 capsule 2   No Known Allergies  I have reviewed patient's Past Medical Hx, Surgical Hx, Family Hx, Social Hx, medications and allergies.   ROS:  Review of Systems  Constitutional: Negative for fever.  Gastrointestinal: Positive for abdominal pain, nausea and vomiting. Negative for constipation and diarrhea.  Genitourinary: Negative for dysuria.  Musculoskeletal: Positive for back pain. Negative for myalgias.  Neurological: Negative for headaches.   Review of Systems  Other systems negative   Physical Exam  Physical Exam Patient Vitals for the past 24 hrs:  BP Temp Temp src Pulse Resp SpO2  12/22/17 1015 129/81 - - 91 - 99 %  12/22/17 1013 - 97.7 F (36.5 C) Oral 95 18 -   Constitutional: Well-developed, well-nourished female in no acute distress.  Cardiovascular: normal rate Respiratory: normal effort GI: Abd soft, non-tender. Pos BS x 4 MS: Extremities nontender, no edema, normal ROM Neurologic: Alert and oriented x 4.  GU: Neg CVAT.  PELVIC EXAM: deferred  FHR 150s per bedside US  LAB RESULTS Results for orders placed or performed during the hospital encounter of 12/22/17 (from the past 24 hour(s))  Urinalysis, Routine w reflex microscopic     Status: Abnormal   Collection Time: 12/22/17 10:02 AM  Result Value Ref Range   Color, Urine  YELLOW YELLOW   APPearance HAZY (A) CLEAR   Specific Gravity, Urine 1.021 1.005 - 1.030   pH 6.0 5.0 - 8.0   Glucose, UA NEGATIVE NEGATIVE mg/dL   Hgb urine dipstick NEGATIVE NEGATIVE   Bilirubin Urine NEGATIVE NEGATIVE   Ketones, ur NEGATIVE NEGATIVE mg/dL   Protein, ur NEGATIVE NEGATIVE mg/dL   Nitrite NEGATIVE NEGATIVE   Leukocytes, UA NEGATIVE NEGATIVE  CBC     Status: Abnormal   Collection Time: 12/22/17  1:07 PM  Result Value Ref Range   WBC 9.8 4.0 - 10.5 K/uL   RBC 4.20 3.87 - 5.11 MIL/uL   Hemoglobin 11.8 (L) 12.0 - 15.0 g/dL  HCT 35.2 (L) 36.0 - 46.0 %   MCV 83.8 78.0 - 100.0 fL   MCH 28.1 26.0 - 34.0 pg   MCHC 33.5 30.0 - 36.0 g/dL   RDW 16.1 09.6 - 04.5 %   Platelets 257 150 - 400 K/uL    O/Positive/-- (05/28 0914)  IMAGING No results found.  MAU Management/MDM: Ordered CBC to assess for signs of systemic infection, WBC is normal UA is clear, but I suspect UTI is undertreated due to type of bacteria, will change to Macrobid Flexeril was given to rule out and treat muscle spasm as source of back pain.  THis provided relief so suspect back pain is muscle spasm related.    ASSESSMENT SIngle IUP at [redacted]w[redacted]d Urinary Tract Infection Low suspicion for pyelonephritis Back pain  PLAN Discharge home Rx Macrobid to more effectively treat UTI Rx Flexeril for back pain Rx Zofran for nausea (other meds not working well)  Pt stable at time of discharge. Encouraged to return here or to other Urgent Care/ED if she develops worsening of symptoms, increase in pain, fever, or other concerning symptoms.    Wynelle Bourgeois CNM, MSN Certified Nurse-Midwife 12/22/2017  10:26 AM

## 2017-12-25 ENCOUNTER — Telehealth: Payer: Self-pay

## 2017-12-25 NOTE — Telephone Encounter (Signed)
Pt wanted to know results of her genetic testing, says she does not have access to her MyChart currently. I advised pt that her genetic testing was low risk and pt wanted to know gender so I informed her she is having a girl. Pt verbalized understanding and had no further questions.

## 2018-01-02 ENCOUNTER — Ambulatory Visit (INDEPENDENT_AMBULATORY_CARE_PROVIDER_SITE_OTHER): Payer: Medicaid Other | Admitting: Interventional Cardiology

## 2018-01-02 ENCOUNTER — Encounter: Payer: Self-pay | Admitting: Interventional Cardiology

## 2018-01-02 VITALS — BP 124/80 | HR 81 | Ht 65.0 in | Wt 175.8 lb

## 2018-01-02 DIAGNOSIS — Z331 Pregnant state, incidental: Secondary | ICD-10-CM | POA: Diagnosis not present

## 2018-01-02 DIAGNOSIS — Z3491 Encounter for supervision of normal pregnancy, unspecified, first trimester: Secondary | ICD-10-CM

## 2018-01-02 DIAGNOSIS — R011 Cardiac murmur, unspecified: Secondary | ICD-10-CM

## 2018-01-02 NOTE — Patient Instructions (Signed)
Medication Instructions:  Your physician recommends that you continue on your current medications as directed. Please refer to the Current Medication list given to you today.  Labwork: None  Testing/Procedures: None  Follow-Up: Your physician recommends that you schedule a follow-up appointment as needed with Dr. Smith.     Any Other Special Instructions Will Be Listed Below (If Applicable).     If you need a refill on your cardiac medications before your next appointment, please call your pharmacy.   

## 2018-01-02 NOTE — Progress Notes (Signed)
Cardiology Office Note    Date:  01/02/2018   ID:  Brittany, [name] 07/28/1980, MRN [id number]  PCP:  Evaristo Bury, NP  Cardiologist: Lesleigh Noe, MD   No chief complaint on file.   History of Present Illness:  Brittany [name] is a 37 y.o. female referred by Brittany [name] [facility] for evaluation of heart murmur.  In her first trimester pregnancy.  Found to have a murmur during a recent evaluation.  She has no cardiopulmonary complaints.  Says that her provider heard a murmur even prior to pregnancy.  3 other pregnancies were not associated with any cardiac issues.  This is the first that she is ever been told of having a heart murmur.  Family history is negative for familial heart disease.  She denies syncope, palpitations, edema, and chest pain.  Past Medical History:  Diagnosis Date  . Gallstone   . Headache(784.0)   . Hypertension   . Pregnancy induced hypertension    after 2nd delivery  . Vaginal Pap smear, abnormal     Past Surgical History:  Procedure Laterality Date  . DILATION AND CURETTAGE OF UTERUS    . LEEP      Current Medications: Outpatient Medications Prior to Visit  Medication Sig Dispense Refill  . cyclobenzaprine (FLEXERIL) 5 MG tablet Take 1 tablet (5 mg total) by mouth 3 (three) times daily as needed for muscle spasms. 30 tablet 0  . Doxylamine-Pyridoxine ER (BONJESTA) 20-20 MG TBCR Take 1 tablet by mouth 2 (two) times daily. 60 tablet 6  . metoCLOPramide (REGLAN) 10 MG tablet Take 1 tablet (10 mg total) by mouth every 6 (six) hours. 30 tablet 0  . nitrofurantoin, macrocrystal-monohydrate, (MACROBID) 100 MG capsule Take 1 capsule (100 mg total) by mouth 2 (two) times daily. 14 capsule 1  . ondansetron (ZOFRAN ODT) 4 MG disintegrating tablet Take 1 tablet (4 mg total) by mouth every 6 (six) hours as needed for nausea. 20 tablet 0  . Prenatal Vit-Fe Fumarate-FA (PRENATAL MULTIVITAMIN) TABS tablet Take 1 tablet by mouth daily at 12 noon.      . promethazine (PHENERGAN) 12.5 MG tablet Take 12.5 mg by mouth every 6 (six) hours as needed for nausea or vomiting.    . Vitamin D, Ergocalciferol, (DRISDOL) 50000 units CAPS capsule Take 1 capsule (50,000 Units total) by mouth every 7 (seven) days. 30 capsule 2  . promethazine (PHENERGAN) 12.5 MG tablet Take 1 tablet (12.5 mg total) by mouth every 6 (six) hours as needed for up to 18 days for nausea or vomiting. (Patient not taking: Reported on 12/07/2017) 30 tablet 0   No facility-administered medications prior to visit.      Allergies:   Patient has no known allergies.   Social History   Socioeconomic History  . Marital status: Married    Spouse name: Not on file  . Number of children: Not on file  . Years of education: Not on file  . Highest education level: Not on file  Occupational History  . Not on file  Social Needs  . Financial resource strain: Not on file  . Food insecurity:    Worry: Not on file    Inability: Not on file  . Transportation needs:    Medical: Not on file    Non-medical: Not on file  Tobacco Use  . Smoking status: Never Smoker  . Smokeless tobacco: Never Used  Substance and Sexual Activity  . Alcohol use: No  Alcohol/week: 0.0 oz  . Drug use: No  . Sexual activity: Yes  Lifestyle  . Physical activity:    Days per week: Not on file    Minutes per session: Not on file  . Stress: Not on file  Relationships  . Social connections:    Talks on phone: Not on file    Gets together: Not on file    Attends religious service: Not on file    Active member of club or organization: Not on file    Attends meetings of clubs or organizations: Not on file    Relationship status: Not on file  Other Topics Concern  . Not on file  Social History Narrative  . Not on file     Family History:  The patient's family history includes Asthma in her maternal grandmother and mother; Hypertension in her maternal grandmother and mother.  Grandmom has "heart  disease" but no details concerning type.  ROS:   Please see the history of present illness.    3 months pregnant and no problems to this point. All other systems reviewed and are negative.   PHYSICAL EXAM:   VS:  BP 124/80   Pulse 81   Ht 5\' 5"  (1.651 m)   Wt 175 lb 12.8 oz (79.7 kg)   LMP 09/25/2017   BMI 29.25 kg/m    GEN: Well nourished, well developed, in no acute distress  HEENT: normal  Neck: no JVD, carotid bruits, or masses Cardiac: RRR;  rubs, or gallops,no edema.  2/6 left upper sternal border crescendo decrescendo systolic murmur.  Standing, the murmur disappears.  No diastolic murmur is heard.ww Respiratory:  clear to auscultation bilaterally, normal work of breathing GI: soft, nontender, nondistended, + BS MS: no deformity or atrophy  Skin: warm and dry, no rash Neuro:  Alert and Oriented x 3, Strength and sensation are intact Psych: euthymic mood, full affect  Wt Readings from Last 3 Encounters:  01/02/18 175 lb 12.8 oz (79.7 kg)  12/12/17 169 lb 9.6 oz (76.9 kg)  12/07/17 167 lb (75.8 kg)      Studies/Labs Reviewed:   EKG:  EKG normal sinus rhythm with nonspecific T wave flattening.  Normal otherwise.  When compared to 2016  Recent Labs: 10/04/2017: TSH 0.70 12/12/2017: ALT 42; BUN 9; Creatinine, Ser 0.62; Potassium 4.1; Sodium 137 12/22/2017: Hemoglobin 11.8; Platelets 257   Lipid Panel    Component Value Date/Time   CHOL 147 10/04/2017 1105   TRIG 81.0 10/04/2017 1105   HDL 67.20 10/04/2017 1105   CHOLHDL 2 10/04/2017 1105   VLDL 16.2 10/04/2017 1105   LDLCALC 63 10/04/2017 1105    Additional studies/ records that were reviewed today include:  No cardiac imaging    ASSESSMENT:    1. Heart murmur   2. First trimester pregnancy      PLAN:  In order of problems listed above:  1. Based upon clinical exam, history, and EKG I believe the murmur is a functional flow murmur related to volume expansion from pregnancy.  In absence of symptoms  and signs of volume overload, no specific evaluation is necessary.  The patient is reassured.  Clinical follow-up as needed if any symptoms worsen for heart disease develop.  Overall her exam is normal.    Medication Adjustments/Labs and Tests Ordered: Current medicines are reviewed at length with the patient today.  Concerns regarding medicines are outlined above.  Medication changes, Labs and Tests ordered today are listed in the Patient Instructions  below. Patient Instructions  Medication Instructions:  Your physician recommends that you continue on your current medications as directed. Please refer to the Current Medication list given to you today.  Labwork: None  Testing/Procedures: None  Follow-Up: Your physician recommends that you schedule a follow-up appointment as needed with Dr. Katrinka Blazing.     Any Other Special Instructions Will Be Listed Below (If Applicable).     If you need a refill on your cardiac medications before your next appointment, please call your pharmacy.      Signed, Lesleigh Noe, MD  01/02/2018 11:19 AM    [facility] [address] [place], [place], [place]  [address] Phone: [phone]; Fax: [phone]

## 2018-01-06 ENCOUNTER — Inpatient Hospital Stay (HOSPITAL_COMMUNITY): Payer: Medicaid Other

## 2018-01-06 ENCOUNTER — Inpatient Hospital Stay (HOSPITAL_COMMUNITY)
Admission: AD | Admit: 2018-01-06 | Discharge: 2018-01-06 | Disposition: A | Discharge status: Home / Self Care | Payer: Medicaid Other | Source: Ambulatory Visit | Attending: Obstetrics & Gynecology | Admitting: Obstetrics & Gynecology

## 2018-01-06 ENCOUNTER — Encounter (HOSPITAL_COMMUNITY): Payer: Self-pay

## 2018-01-06 DIAGNOSIS — R109 Unspecified abdominal pain: Secondary | ICD-10-CM | POA: Diagnosis not present

## 2018-01-06 DIAGNOSIS — Z79899 Other long term (current) drug therapy: Secondary | ICD-10-CM | POA: Diagnosis not present

## 2018-01-06 DIAGNOSIS — R102 Pelvic and perineal pain: Secondary | ICD-10-CM | POA: Insufficient documentation

## 2018-01-06 DIAGNOSIS — Z3A13 13 weeks gestation of pregnancy: Secondary | ICD-10-CM | POA: Insufficient documentation

## 2018-01-06 DIAGNOSIS — N949 Unspecified condition associated with female genital organs and menstrual cycle: Secondary | ICD-10-CM

## 2018-01-06 DIAGNOSIS — O26892 Other specified pregnancy related conditions, second trimester: Secondary | ICD-10-CM | POA: Diagnosis not present

## 2018-01-06 DIAGNOSIS — O26891 Other specified pregnancy related conditions, first trimester: Secondary | ICD-10-CM | POA: Diagnosis present

## 2018-01-06 DIAGNOSIS — O26899 Other specified pregnancy related conditions, unspecified trimester: Secondary | ICD-10-CM

## 2018-01-06 LAB — CBC
HEMATOCRIT: 35.2 % — AB (ref 36.0–46.0)
Hemoglobin: 11.9 g/dL — ABNORMAL LOW (ref 12.0–15.0)
MCH: 28.3 pg (ref 26.0–34.0)
MCHC: 33.8 g/dL (ref 30.0–36.0)
MCV: 83.6 fL (ref 78.0–100.0)
PLATELETS: 238 10*3/uL (ref 150–400)
RBC: 4.21 MIL/uL (ref 3.87–5.11)
RDW: 13.7 % (ref 11.5–15.5)
WBC: 10.4 10*3/uL (ref 4.0–10.5)

## 2018-01-06 LAB — WET PREP, GENITAL
CLUE CELLS WET PREP: NONE SEEN
Sperm: NONE SEEN
TRICH WET PREP: NONE SEEN
Yeast Wet Prep HPF POC: NONE SEEN

## 2018-01-06 LAB — URINALYSIS, ROUTINE W REFLEX MICROSCOPIC
BILIRUBIN URINE: NEGATIVE
Glucose, UA: NEGATIVE mg/dL
Hgb urine dipstick: NEGATIVE
Ketones, ur: NEGATIVE mg/dL
LEUKOCYTES UA: NEGATIVE
NITRITE: NEGATIVE
PROTEIN: NEGATIVE mg/dL
Specific Gravity, Urine: 1.011 (ref 1.005–1.030)
pH: 7 (ref 5.0–8.0)

## 2018-01-06 LAB — COMPREHENSIVE METABOLIC PANEL
ALT: 21 U/L (ref 14–54)
AST: 23 U/L (ref 15–41)
Albumin: 3.8 g/dL (ref 3.5–5.0)
Alkaline Phosphatase: 50 U/L (ref 38–126)
Anion gap: 10 (ref 5–15)
BILIRUBIN TOTAL: 0.5 mg/dL (ref 0.3–1.2)
BUN: 7 mg/dL (ref 6–20)
CO2: 22 mmol/L (ref 22–32)
CREATININE: 0.58 mg/dL (ref 0.44–1.00)
Calcium: 9 mg/dL (ref 8.9–10.3)
Chloride: 101 mmol/L (ref 101–111)
Glucose, Bld: 84 mg/dL (ref 65–99)
Potassium: 3.9 mmol/L (ref 3.5–5.1)
Sodium: 133 mmol/L — ABNORMAL LOW (ref 135–145)
TOTAL PROTEIN: 8 g/dL (ref 6.5–8.1)

## 2018-01-06 MED ORDER — CYCLOBENZAPRINE HCL 10 MG PO TABS: 10.0000 mg | ORAL_TABLET | Freq: Once | ORAL | Status: DC

## 2018-01-06 MED ORDER — CYCLOBENZAPRINE HCL 5 MG PO TABS: 10.0000 mg | ORAL_TABLET | Freq: Three times a day (TID) | ORAL | 0 refills | Status: DC | PRN

## 2018-01-06 MED ORDER — KETOROLAC TROMETHAMINE 30 MG/ML IJ SOLN
30.0000 mg | Freq: Once | INTRAMUSCULAR | Status: AC
  Administered 2018-01-06: 30 mg via INTRAMUSCULAR
  Filled 2018-01-06: qty 1

## 2018-01-06 MED ORDER — DOCUSATE SODIUM 100 MG PO CAPS: 100.0000 mg | ORAL_CAPSULE | Freq: Two times a day (BID) | ORAL | 0 refills | Status: DC

## 2018-01-06 NOTE — MAU Note (Signed)
Some cramping last night and then other child kicked her in abdomen last night. Cramping cont into this AM. Cramping all over, sharp pains l side mid back/flank. No bleeding.

## 2018-01-06 NOTE — Discharge Instructions (Signed)
-take colace 100 mg twice a day; increase water fruits and vegetabls -purchase belly band and wear daily to support her belly  Constipation, Adult Constipation is when a person:  Poops (has a bowel movement) fewer times in a week than normal.  Has a hard time pooping.  Has poop that is dry, hard, or bigger than normal.  Follow these instructions at home: Eating and drinking   Eat foods that have a lot of fiber, such as: ? Fresh fruits and vegetables. ? Whole grains. ? Beans.  Eat less of foods that are high in fat, low in fiber, or overly processed, such as: ? [place] fries. ? Hamburgers. ? Cookies. ? Candy. ? Soda.  Drink enough fluid to keep your pee (urine) clear or pale yellow. General instructions  Exercise regularly or as told by your doctor.  Go to the restroom when you feel like you need to poop. Do not hold it in.  Take over-the-counter and prescription medicines only as told by your doctor. These include any fiber supplements.  Do pelvic floor retraining exercises, such as: ? Doing deep breathing while relaxing your lower belly (abdomen). ? Relaxing your pelvic floor while pooping.  Watch your condition for any changes.  Keep all follow-up visits as told by your doctor. This is important. Contact a doctor if:  You have pain that gets worse.  You have a fever.  You have not pooped for 4 days.  You throw up (vomit).  You are not hungry.  You lose weight.  You are bleeding from the anus.  You have thin, pencil-like poop (stool). Get help right away if:  You have a fever, and your symptoms suddenly get worse.  You leak poop or have blood in your poop.  Your belly feels hard or bigger than normal (is bloated).  You have very bad belly pain.  You feel dizzy or you faint. This information is not intended to replace advice given to you by your health care provider. Make sure you discuss any questions you have with your health care  provider. Document Released: 12/21/2007 Document Revised: 01/22/2016 Document Reviewed: 12/23/2015 Elsevier Interactive Patient Education  2018 [employer]   Round Ligament Pain The round ligament is a cord of muscle and tissue that helps to support the uterus. It can become a source of pain during pregnancy if it becomes stretched or twisted as the baby grows. The pain usually begins in the second trimester of pregnancy, and it can come and go until the baby is delivered. It is not a serious problem, and it does not cause harm to the baby. Round ligament pain is usually a short, sharp, and pinching pain, but it can also be a dull, lingering, and aching pain. The pain is felt in the lower side of the abdomen or in the groin. It usually starts deep in the groin and moves up to the outside of the hip area. Pain can occur with:  A sudden change in position.  Rolling over in bed.  Coughing or sneezing.  Physical activity.  Follow these instructions at home: Watch your condition for any changes. Take these steps to help with your pain:  When the pain starts, relax. Then try: ? Sitting down. ? Flexing your knees up to your abdomen. ? Lying on your side with one pillow under your abdomen and another pillow between your legs. ? Sitting in a warm bath for 15-20 minutes or until the pain goes away.  Take over-the-counter  and prescription medicines only as told by your health care provider.  Move slowly when you sit and stand.  Avoid long walks if they cause pain.  Stop or lessen your physical activities if they cause pain.  Contact a health care provider if:  Your pain does not go away with treatment.  You feel pain in your back that you did not have before.  Your medicine is not helping. Get help right away if:  You develop a fever or chills.  You develop uterine contractions.  You develop vaginal bleeding.  You develop nausea or vomiting.  You develop diarrhea.  You  have pain when you urinate. This information is not intended to replace advice given to you by your health care provider. Make sure you discuss any questions you have with your health care provider. Document Released: 04/12/2008 Document Revised: 12/10/2015 Document Reviewed: 09/10/2014 Elsevier Interactive Patient Education  [employer].

## 2018-01-06 NOTE — MAU Provider Note (Addendum)
Patient Brittany ArnoldStacy [name] Brittany [name] Bryner is a [age] y.o. [phone] Here with complaints of cramping last night, sharp pain on her left side and then a lot of pressure when she urinates. All of this started yesterday; all at once. She describes the pain as pressure, not burning.  She has constant pain in her lower back.  She was recently treated for a UTI in early June and took all of her medications. She denies history of PID or STIs.    History     CSN: [id number]  Arrival date and time: 01/06/18 1045   None     Chief Complaint  Patient presents with  . Abdominal Pain  . Back Pain   Abdominal Pain  This is a new problem. The current episode started yesterday. The onset quality is sudden. The problem occurs constantly. The pain is located in the suprapubic region. The pain is at a severity of 7/10. The quality of the pain is cramping. Associated symptoms include headaches. Pertinent negatives include no dysuria or fever.  Back Pain  The pain is at a severity of 7/10. Associated symptoms include abdominal pain and headaches. Pertinent negatives include no chest pain, dysuria or fever.  She describes her side pain as the same as her back pain. It is a 7/10. Nothing makes it better or worse; she did not try anything for it.  She also complains of pelvic pressure when she urinates; she denies abnormal odor, color when she urinates.   She was constipated this week but had  BM on Friday. She had to strain when she went to the bathroom on Friday.  OB History    Gravida  9   Para  3   Term  2   Preterm  1   AB  5   Living  3     SAB  4   TAB  1   Ectopic  0   Multiple  0   Live Births  3           Past Medical History:  Diagnosis Date  . Gallstone   . Headache(784.0)   . Hypertension   . Pregnancy induced hypertension    after 2nd delivery  . Vaginal Pap smear, abnormal     Past Surgical History:  Procedure Laterality Date  . DILATION AND CURETTAGE OF UTERUS    . LEEP       Family History  Problem Relation Age of Onset  . Asthma Mother   . Hypertension Mother   . Asthma Maternal Grandmother   . Hypertension Maternal Grandmother     Social History   Tobacco Use  . Smoking status: Never Smoker  . Smokeless tobacco: Never Used  Substance Use Topics  . Alcohol use: No    Alcohol/week: 0.0 oz  . Drug use: No    Allergies: No Known Allergies  Medications Prior to Admission  Medication Sig Dispense Refill Last Dose  . cyclobenzaprine (FLEXERIL) 5 MG tablet Take 1 tablet (5 mg total) by mouth 3 (three) times daily as needed for muscle spasms. 30 tablet 0 Taking  . Doxylamine-Pyridoxine ER (BONJESTA) 20-20 MG TBCR Take 1 tablet by mouth 2 (two) times daily. 60 tablet 6 Taking  . metoCLOPramide (REGLAN) 10 MG tablet Take 1 tablet (10 mg total) by mouth every 6 (six) hours. 30 tablet 0 Taking  . nitrofurantoin, macrocrystal-monohydrate, (MACROBID) 100 MG capsule Take 1 capsule (100 mg total) by mouth 2 (two) times daily. 14 capsule 1 Taking  .  ondansetron (ZOFRAN ODT) 4 MG disintegrating tablet Take 1 tablet (4 mg total) by mouth every 6 (six) hours as needed for nausea. 20 tablet 0 Taking  . Prenatal Vit-Fe Fumarate-FA (PRENATAL MULTIVITAMIN) TABS tablet Take 1 tablet by mouth daily at 12 noon.   Taking  . promethazine (PHENERGAN) 12.5 MG tablet Take 12.5 mg by mouth every 6 (six) hours as needed for nausea or vomiting.   Taking  . Vitamin D, Ergocalciferol, (DRISDOL) 50000 units CAPS capsule Take 1 capsule (50,000 Units total) by mouth every 7 (seven) days. 30 capsule 2 Taking    Review of Systems  Constitutional: Negative for fever.  Cardiovascular: Negative for chest pain.  Gastrointestinal: Positive for abdominal pain.  Genitourinary: Negative for dysuria.  Musculoskeletal: Positive for back pain.  Neurological: Positive for headaches.   Physical Exam   Blood pressure 119/74, pulse 76, temperature 98.3 F (36.8 C), temperature source Oral,  resp. rate 18, height 5\' 5"  (1.651 m), weight 175 lb (79.4 kg), last menstrual period 09/25/2017, currently breastfeeding.  Physical Exam  Constitutional: She appears well-developed.  HENT:  Head: Normocephalic.  Eyes: Pupils are equal, round, and reactive to light.  Respiratory: Effort normal.  GI: Soft.  Genitourinary: Vagina normal.  Genitourinary Comments: Normal external genitalia; no discharge or blood in the vagina. No CMT but patient very distressed and crying during speculum and pelvic exam so unable to complete bimanual.   Musculoskeletal: Normal range of motion.  Neurological: She is alert.  Skin: Skin is warm and dry.    MAU Course  Procedures  MDM -afebrile -will  do [place] to rule out torsion -CBC and CMP are normal for signs of infection -Urine culture sent -wet prep normal -GC chlaymdia pending -30 mg injection of toradol; patient is almost 35 weeks and patient does not have a ride so cannot take flexeril, percocet, etc.   Patient now reports that pain is a 3/10; she feels ready for discharge. Was observed sleeping in MAU bed.   US Pelvis Limited  Result Date: 01/06/2018 CLINICAL DATA:  37 y/o F; left flank pain, evaluate for ovarian torsion. Thirteen weeks pregnant. EXAM: TRANSABDOMINAL ULTRASOUND OF PELVIS DOPPLER ULTRASOUND OF OVARIES TECHNIQUE: Transabdominal ultrasound examination of the pelvis was performed including evaluation of the uterus, ovaries, adnexal regions, and pelvic cul-de-sac. Color and duplex Doppler ultrasound was utilized to evaluate blood flow to the ovaries. COMPARISON:  None. FINDINGS: Uterus Fetal heart rate 159 beats per minute. Fetal and placental anatomy not assessed. Continued regular follow-up with obstetrician recommended. Right ovary Measurements: 2.6 x 1.6 x 1.4 cm. Normal appearance/no adnexal mass. Left ovary Measurements: 2.6 x 1.4 x 1.9 cm. Normal appearance/no adnexal mass. Pulsed Doppler evaluation demonstrates normal low-resistance  arterial and venous waveforms in both ovaries. Other: No free fluid identified. IMPRESSION: No evidence for ovarian torsion at this time. No acute process identified. Electronically Signed   By: Mitzi Hansen M.D.   On: 01/06/2018 14:24      Assessment and Plan   1. Abdominal pain affecting pregnancy   2. Round ligament pain      2. Patient stable for discharge; RX for Flexeril and colace given. Discussed with patient in detail importance of keeping her bowels moving to prevent painful constipation. Also explained that as her gestation increases, she will continue to have stretching of her belly, back and side. She can try belly band, heat, tylenol. Explained that, physiologically, she is going to have a lot of pressure in her pelvis due to  her grand multiparity.    3. Patient to keep nob visit on [facility] at [facility], June 25.   4. Return to MAU if her condition worsens or changes. All questions answered; patient stable for discharge.   Charlesetta Garibaldi Bejamin Hackbart 01/06/2018, 11:40 AM

## 2018-01-08 LAB — CULTURE, OB URINE: Culture: <10000 — AB

## 2018-01-08 LAB — GC/CHLAMYDIA PROBE AMP (~~LOC~~) NOT AT ARMC
Chlamydia: NEGATIVE
NEISSERIA GONORRHEA: NEGATIVE

## 2018-01-09 ENCOUNTER — Ambulatory Visit (INDEPENDENT_AMBULATORY_CARE_PROVIDER_SITE_OTHER): Payer: Medicaid Other | Admitting: Obstetrics and Gynecology

## 2018-01-09 ENCOUNTER — Encounter: Payer: Self-pay | Admitting: Obstetrics and Gynecology

## 2018-01-09 VITALS — BP 137/83 | HR 85 | Wt 178.9 lb

## 2018-01-09 DIAGNOSIS — R740 Nonspecific elevation of levels of transaminase and lactic acid dehydrogenase [LDH]: Secondary | ICD-10-CM

## 2018-01-09 DIAGNOSIS — O0990 Supervision of high risk pregnancy, unspecified, unspecified trimester: Secondary | ICD-10-CM

## 2018-01-09 DIAGNOSIS — E0590 Thyrotoxicosis, unspecified without thyrotoxic crisis or storm: Secondary | ICD-10-CM

## 2018-01-09 DIAGNOSIS — R011 Cardiac murmur, unspecified: Secondary | ICD-10-CM

## 2018-01-09 DIAGNOSIS — O09899 Supervision of other high risk pregnancies, unspecified trimester: Secondary | ICD-10-CM | POA: Insufficient documentation

## 2018-01-09 DIAGNOSIS — O10919 Unspecified pre-existing hypertension complicating pregnancy, unspecified trimester: Secondary | ICD-10-CM

## 2018-01-09 DIAGNOSIS — Z9889 Other specified postprocedural states: Secondary | ICD-10-CM

## 2018-01-09 DIAGNOSIS — R7401 Elevation of levels of liver transaminase levels: Secondary | ICD-10-CM

## 2018-01-09 DIAGNOSIS — O09219 Supervision of pregnancy with history of pre-term labor, unspecified trimester: Secondary | ICD-10-CM

## 2018-01-09 DIAGNOSIS — O09299 Supervision of pregnancy with other poor reproductive or obstetric history, unspecified trimester: Secondary | ICD-10-CM

## 2018-01-09 MED ORDER — ASPIRIN EC 81 MG PO TBEC: 81.0000 mg | DELAYED_RELEASE_TABLET | Freq: Every day | ORAL | 2 refills | Status: DC

## 2018-01-09 NOTE — Progress Notes (Signed)
   PRENATAL VISIT NOTE  Subjective:  Brittany [name] is a 37 y.o. [phone] at 845w1d being seen today for ongoing prenatal care.  She is currently monitored for the following issues for this high-risk pregnancy and has HTN in pregnancy, chronic; Subclinical hyperthyroidism; Migraine; Murmur, cardiac; Anemia; Supervision of high risk pregnancy, antepartum; Elevated ALT measurement; Vitamin D deficiency; UTI (urinary tract infection) during pregnancy, first trimester; H/O LEEP; H/O intrauterine growth restriction in prior pregnancy, currently pregnant; H/O pre-eclampsia in prior pregnancy, currently pregnant; and H/O preterm delivery, currently pregnant on their problem list.  Patient reports vaginal pressure.  Contractions: Not present. Vag. Bleeding: None.  Movement: Present. Denies leaking of fluid.   The following portions of the patient's history were reviewed and updated as appropriate: allergies, current medications, past family history, past medical history, past social history, past surgical history and problem list. Problem list updated.  Objective:   Vitals:   01/09/18 0826  BP: 137/83  Pulse: 85  Weight: 178 lb 14.4 oz (81.1 kg)    Fetal Status: Fetal Heart Rate (bpm): 156   Movement: Present     General:  Alert, oriented and cooperative. Patient is in no acute distress.  Skin: Skin is warm and dry. No rash noted.   Cardiovascular: Normal heart rate noted  Respiratory: Normal respiratory effort, no problems with respiration noted  Abdomen: Soft, gravid, appropriate for gestational age.  Pain/Pressure: Present     Pelvic: Cervical exam deferred        Extremities: Normal range of motion.  Edema: None  Mental Status: Normal mood and affect. Normal behavior. Normal judgment and thought content.   Assessment and Plan:  Pregnancy: [id number] at 425w1d   1. Supervision of high risk pregnancy, antepartum Panorama low risk Anatomy scheduled for 7/29  2. Elevated ALT  measurement Repeat at 13 weeks wnl  3. Murmur, cardiac Saw cards 12/2017, feel it is functional flow murmur related to volume expansion in pregnancy, no further eval necessary unless symptomatic  4. HTN in pregnancy, chronic Baseline labs wnl (ALT elevated and then normalized)  5. H/O LEEP Pap 10/26/16: negative  6. H/O intrauterine growth restriction in prior pregnancy, currently pregnant Will need growth f/u  7. Subclinical hyperthyroidism TSH 1st trim wnl for pregnancy  8. H/O pre-eclampsia in prior pregnancy, currently pregnant Induced 37 weeks, severe features  9. H/O preterm delivery, currently pregnant For 17P, previously ordered   Preterm labor symptoms and general obstetric precautions including but not limited to vaginal bleeding, contractions, leaking of fluid and fetal movement were reviewed in detail with the patient. Please refer to After Visit Summary for other counseling recommendations.  Return in about 1 month (around 02/06/2018) for OB visit (MD).  Future Appointments  Date Time Provider Department Center  02/06/2018  8:45 AM Willodean RosenthalHarraway-Smith, Carolyn, MD [facility] None  02/12/2018 10:30 AM [facility] US 1 [facility]  10/08/2018 10:00 AM Evaristo BuryShambley, Ashleigh N, NP [facility]    Conan BowensKelly M Leiah Giannotti, MD

## 2018-01-17 ENCOUNTER — Telehealth: Payer: Self-pay

## 2018-01-17 ENCOUNTER — Other Ambulatory Visit: Payer: Self-pay | Admitting: Obstetrics and Gynecology

## 2018-01-17 MED ORDER — BUTALBITAL-APAP-CAFFEINE 50-325-40 MG PO CAPS: 1.0000 | ORAL_CAPSULE | Freq: Four times a day (QID) | ORAL | 0 refills | Status: DC | PRN

## 2018-01-17 NOTE — Telephone Encounter (Signed)
[facility] Celeste[facility] called to inform us that they have the patients Bonjesta ready to be shipped, but is waiting on pt to return call to verify insurance information. Pt informed of this information.

## 2018-01-17 NOTE — Progress Notes (Signed)
Sent fioricet to pharmacy for headache.

## 2018-01-17 NOTE — Telephone Encounter (Signed)
Pt called stating that she has been having a lot of headaches. She states that tylenol is not working for her, and wants to know if there is anything else that she can take or be prescribed. Please advise

## 2018-01-18 ENCOUNTER — Inpatient Hospital Stay (HOSPITAL_COMMUNITY)
Admission: AD | Admit: 2018-01-18 | Discharge: 2018-01-18 | Disposition: A | Discharge status: Home / Self Care | Payer: Medicaid Other | Source: Ambulatory Visit | Attending: Obstetrics and Gynecology | Admitting: Obstetrics and Gynecology

## 2018-01-18 ENCOUNTER — Encounter (HOSPITAL_COMMUNITY): Payer: Self-pay | Admitting: *Deleted

## 2018-01-18 DIAGNOSIS — R51 Headache: Secondary | ICD-10-CM | POA: Diagnosis present

## 2018-01-18 DIAGNOSIS — Z3A14 14 weeks gestation of pregnancy: Secondary | ICD-10-CM | POA: Diagnosis not present

## 2018-01-18 DIAGNOSIS — O26892 Other specified pregnancy related conditions, second trimester: Secondary | ICD-10-CM | POA: Diagnosis not present

## 2018-01-18 DIAGNOSIS — O09219 Supervision of pregnancy with history of pre-term labor, unspecified trimester: Secondary | ICD-10-CM

## 2018-01-18 DIAGNOSIS — O26899 Other specified pregnancy related conditions, unspecified trimester: Secondary | ICD-10-CM | POA: Diagnosis not present

## 2018-01-18 DIAGNOSIS — Z825 Family history of asthma and other chronic lower respiratory diseases: Secondary | ICD-10-CM | POA: Diagnosis not present

## 2018-01-18 DIAGNOSIS — Z79899 Other long term (current) drug therapy: Secondary | ICD-10-CM | POA: Insufficient documentation

## 2018-01-18 DIAGNOSIS — Z7982 Long term (current) use of aspirin: Secondary | ICD-10-CM | POA: Diagnosis not present

## 2018-01-18 DIAGNOSIS — Z8249 Family history of ischemic heart disease and other diseases of the circulatory system: Secondary | ICD-10-CM | POA: Insufficient documentation

## 2018-01-18 DIAGNOSIS — O09522 Supervision of elderly multigravida, second trimester: Secondary | ICD-10-CM | POA: Insufficient documentation

## 2018-01-18 DIAGNOSIS — O10919 Unspecified pre-existing hypertension complicating pregnancy, unspecified trimester: Secondary | ICD-10-CM

## 2018-01-18 DIAGNOSIS — O09899 Supervision of other high risk pregnancies, unspecified trimester: Secondary | ICD-10-CM

## 2018-01-18 DIAGNOSIS — Z9889 Other specified postprocedural states: Secondary | ICD-10-CM

## 2018-01-18 DIAGNOSIS — O09299 Supervision of pregnancy with other poor reproductive or obstetric history, unspecified trimester: Secondary | ICD-10-CM

## 2018-01-18 LAB — URINALYSIS, ROUTINE W REFLEX MICROSCOPIC
BILIRUBIN URINE: NEGATIVE
Glucose, UA: NEGATIVE mg/dL
Hgb urine dipstick: NEGATIVE
KETONES UR: NEGATIVE mg/dL
Leukocytes, UA: NEGATIVE
NITRITE: NEGATIVE
Protein, ur: NEGATIVE mg/dL
Specific Gravity, Urine: 1.024 (ref 1.005–1.030)
pH: 6 (ref 5.0–8.0)

## 2018-01-18 MED ORDER — ACETAMINOPHEN 500 MG PO TABS
1000.0000 mg | ORAL_TABLET | Freq: Once | ORAL | Status: AC
  Administered 2018-01-18: 1000 mg via ORAL
  Filled 2018-01-18: qty 2

## 2018-01-18 NOTE — Discharge Instructions (Signed)
Second Trimester of Pregnancy The second trimester is from week 13 through week 28, month 4 through 6. This is often the time in pregnancy that you feel your best. Often times, morning sickness has lessened or quit. You may have more energy, and you may get hungry more often. Your unborn baby (fetus) is growing rapidly. At the end of the sixth month, he or she is about 9 inches long and weighs about 1 pounds. You will likely feel the baby move (quickening) between 18 and 20 weeks of pregnancy. Follow these instructions at home:  Avoid all smoking, herbs, and alcohol. Avoid drugs not approved by your doctor.  Do not use any tobacco products, including cigarettes, chewing tobacco, and electronic cigarettes. If you need help quitting, ask your doctor. You may get counseling or other support to help you quit.  Only take medicine as told by your doctor. Some medicines are safe and some are not during pregnancy.  Exercise only as told by your doctor. Stop exercising if you start having cramps.  Eat regular, healthy meals.  Wear a good support bra if your breasts are tender.  Do not use hot tubs, steam rooms, or saunas.  Wear your seat belt when driving.  Avoid raw meat, uncooked cheese, and liter boxes and soil used by cats.  Take your prenatal vitamins.  Take 1500-2000 milligrams of calcium daily starting at the 20th week of pregnancy until you deliver your baby.  Try taking medicine that helps you poop (stool softener) as needed, and if your doctor approves. Eat more fiber by eating fresh fruit, vegetables, and whole grains. Drink enough fluids to keep your pee (urine) clear or pale yellow.  Take warm water baths (sitz baths) to soothe pain or discomfort caused by hemorrhoids. Use hemorrhoid cream if your doctor approves.  If you have puffy, bulging veins (varicose veins), wear support hose. Raise (elevate) your feet for 15 minutes, 3-4 times a day. Limit salt in your diet.  Avoid heavy  lifting, wear low heals, and sit up straight.  Rest with your legs raised if you have leg cramps or low back pain.  Visit your dentist if you have not gone during your pregnancy. Use a soft toothbrush to brush your teeth. Be gentle when you floss.  You can have sex (intercourse) unless your doctor tells you not to.  Go to your doctor visits. Get help if:  You feel dizzy.  You have mild cramps or pressure in your lower belly (abdomen).  You have a nagging pain in your belly area.  You continue to feel sick to your stomach (nauseous), throw up (vomit), or have watery poop (diarrhea).  You have bad smelling fluid coming from your vagina.  You have pain with peeing (urination). Get help right away if:  You have a fever.  You are leaking fluid from your vagina.  You have spotting or bleeding from your vagina.  You have severe belly cramping or pain.  You lose or gain weight rapidly.  You have trouble catching your breath and have chest pain.  You notice sudden or extreme puffiness (swelling) of your face, hands, ankles, feet, or legs.  You have not felt the baby move in over an hour.  You have severe headaches that do not go away with medicine.  You have vision changes. This information is not intended to replace advice given to you by your health care provider. Make sure you discuss any questions you have with your health care   provider. Document Released: 09/28/2009 Document Revised: 12/10/2015 Document Reviewed: 09/04/2012 Elsevier Interactive Patient Education  2017 [employer] Inc.  

## 2018-01-18 NOTE — MAU Provider Note (Signed)
History     CSN: [id number]  Arrival date and time: 01/18/18 1037   First Provider Initiated Contact with Patient 01/18/18 1123      Chief Complaint  Patient presents with  . Headache  . Nausea  . Hot Flashes   HPI  Brittany [name] is a 37 y.o. [id number] at 2833w3d who presents to MAU with complaint of headache without aura for the past two days. Describes pain as in the middle of her forehead, does not radiate, no alleviating factors, aggravated by bright light and movement. Denies vaginal bleeding, leaking of fluid, fever, falls, or recent illness.  This is a new problem this pregnancy but patient reports she has a history of severe headaches in previous pregnancies.   Patient reports she called clinic yesterday and received a prescription for Fioricet. States she took it once last night but did not get any relief.  Patient states she had steak for breakfast before starting her work day around 7:00am and has been drinking water throughout the morning.  OB History    Gravida  9   Para  3   Term  2   Preterm  1   AB  5   Living  3     SAB  4   TAB  1   Ectopic  0   Multiple  0   Live Births  3           Past Medical History:  Diagnosis Date  . Gallstone   . Headache(784.0)   . Hypertension   . Maternal chronic hypertension in first trimester 04/09/2011  . Pregnancy induced hypertension    after 2nd delivery  . Vaginal Pap smear, abnormal     Past Surgical History:  Procedure Laterality Date  . DILATION AND CURETTAGE OF UTERUS    . LEEP      Family History  Problem Relation Age of Onset  . Asthma Mother   . Hypertension Mother   . Asthma Maternal Grandmother   . Hypertension Maternal Grandmother     Social History   Tobacco Use  . Smoking status: Never Smoker  . Smokeless tobacco: Never Used  Substance Use Topics  . Alcohol use: No    Alcohol/week: 0.0 oz  . Drug use: No    Allergies: No Known Allergies  Medications Prior to  Admission  Medication Sig Dispense Refill Last Dose  . aspirin EC 81 MG tablet Take 1 tablet (81 mg total) by mouth daily. Take after 12 weeks for prevention of preeclampsia later in pregnancy 300 tablet 2   . Butalbital-APAP-Caffeine 50-325-40 MG capsule Take 1 capsule by mouth every 6 (six) hours as needed for headache. 20 capsule 0   . cyclobenzaprine (FLEXERIL) 5 MG tablet Take 2 tablets (10 mg total) by mouth 3 (three) times daily as needed for muscle spasms. 50 tablet 0 Taking  . docusate sodium (COLACE) 100 MG capsule Take 1 capsule (100 mg total) by mouth 2 (two) times daily. 10 capsule 0 Taking  . Doxylamine-Pyridoxine ER (BONJESTA) 20-20 MG TBCR Take 1 tablet by mouth 2 (two) times daily. 60 tablet 6 Taking  . metoCLOPramide (REGLAN) 10 MG tablet Take 1 tablet (10 mg total) by mouth every 6 (six) hours. 30 tablet 0 Taking  . ondansetron (ZOFRAN ODT) 4 MG disintegrating tablet Take 1 tablet (4 mg total) by mouth every 6 (six) hours as needed for nausea. 20 tablet 0 Taking  . Prenatal Vit-Fe Fumarate-FA (PRENATAL MULTIVITAMIN) TABS tablet  Take 1 tablet by mouth daily at 12 noon.   Taking  . promethazine (PHENERGAN) 12.5 MG tablet Take 12.5 mg by mouth every 6 (six) hours as needed for nausea or vomiting.   Taking  . Vitamin D, Ergocalciferol, (DRISDOL) 50000 units CAPS capsule Take 1 capsule (50,000 Units total) by mouth every 7 (seven) days. 30 capsule 2 Taking    Review of Systems  Constitutional: Negative for chills, fatigue and fever.  Eyes: Positive for photophobia. Negative for pain, discharge and visual disturbance.  Respiratory: Negative for shortness of breath and wheezing.   Gastrointestinal: Negative for nausea and vomiting.  Genitourinary: Negative for difficulty urinating, vaginal bleeding, vaginal discharge and vaginal pain.  Neurological: Positive for headaches. Negative for dizziness and light-headedness.  All other systems reviewed and are negative.  Physical Exam    Blood pressure 110/75, pulse 78, resp. rate 17, height 5\' 5"  (1.651 m), weight 175 lb (79.4 kg), last menstrual period 09/25/2017, SpO2 98 %, currently breastfeeding.  Physical Exam  Nursing note and vitals reviewed. Constitutional: She is oriented to person, place, and time. She appears well-developed and well-nourished.  Neck: Normal range of motion.  Cardiovascular: Normal rate, regular rhythm, normal heart sounds and intact distal pulses.  Respiratory: Effort normal and breath sounds normal.  GI: Soft. Bowel sounds are normal.  Musculoskeletal: Normal range of motion.  Neurological: She is alert and oriented to person, place, and time. She has normal reflexes.  Skin: Skin is warm and dry.  Psychiatric: She has a normal mood and affect. Her behavior is normal. Judgment and thought content normal.    MAU Course  Procedures  MDM FHT 158 Pain responsive to PO Tylenol, pain now 5/10 Normotensive  Patient Vitals for the past 24 hrs:  BP Pulse Resp SpO2 Height Weight  01/18/18 1110 110/75 78 17 98 % 5\' 5"  (1.651 m) 175 lb (79.4 kg)    Orders Placed This Encounter  Procedures  . Urinalysis, Routine w reflex microscopic   Results for orders placed or performed during the hospital encounter of 01/18/18 (from the past 24 hour(s))  Urinalysis, Routine w reflex microscopic     Status: Abnormal   Collection Time: 01/18/18 11:13 AM  Result Value Ref Range   Color, Urine YELLOW YELLOW   APPearance HAZY (A) CLEAR   Specific Gravity, Urine 1.024 1.005 - 1.030   pH 6.0 5.0 - 8.0   Glucose, UA NEGATIVE NEGATIVE mg/dL   Hgb urine dipstick NEGATIVE NEGATIVE   Bilirubin Urine NEGATIVE NEGATIVE   Ketones, ur NEGATIVE NEGATIVE mg/dL   Protein, ur NEGATIVE NEGATIVE mg/dL   Nitrite NEGATIVE NEGATIVE   Leukocytes, UA NEGATIVE NEGATIVE   Meds ordered this encounter  Medications  . acetaminophen (TYLENOL) tablet 1,000 mg   Assessment and Plan  --[id number] at [redacted]w[redacted]d  --Headache in  pregnancy --Continue taking Fioricet q six hours as prescribed --Discharge home in stable condition  Calvert Cantor, CNM 01/18/2018, 12:38 PM

## 2018-01-18 NOTE — MAU Note (Signed)
Pt states she has been having headaches and hot flashes that have been going on for two days now.  Tried tylenol but it didn't help.  Last dose was last night around 0000 2 extra strength.  Denies LOF.VB.

## 2018-01-19 ENCOUNTER — Encounter (HOSPITAL_COMMUNITY): Payer: Self-pay

## 2018-01-19 ENCOUNTER — Other Ambulatory Visit: Payer: Self-pay

## 2018-01-19 ENCOUNTER — Inpatient Hospital Stay (HOSPITAL_COMMUNITY)
Admission: AD | Admit: 2018-01-19 | Discharge: 2018-01-19 | Disposition: A | Discharge status: Home / Self Care | Payer: Medicaid Other | Source: Ambulatory Visit | Attending: Obstetrics and Gynecology | Admitting: Obstetrics and Gynecology

## 2018-01-19 ENCOUNTER — Telehealth: Payer: Self-pay

## 2018-01-19 DIAGNOSIS — O162 Unspecified maternal hypertension, second trimester: Secondary | ICD-10-CM | POA: Diagnosis not present

## 2018-01-19 DIAGNOSIS — R51 Headache: Secondary | ICD-10-CM | POA: Insufficient documentation

## 2018-01-19 DIAGNOSIS — Z3A14 14 weeks gestation of pregnancy: Secondary | ICD-10-CM | POA: Insufficient documentation

## 2018-01-19 DIAGNOSIS — O26892 Other specified pregnancy related conditions, second trimester: Secondary | ICD-10-CM | POA: Diagnosis not present

## 2018-01-19 DIAGNOSIS — Z7982 Long term (current) use of aspirin: Secondary | ICD-10-CM | POA: Insufficient documentation

## 2018-01-19 DIAGNOSIS — R519 Headache, unspecified: Secondary | ICD-10-CM

## 2018-01-19 DIAGNOSIS — Z79899 Other long term (current) drug therapy: Secondary | ICD-10-CM | POA: Insufficient documentation

## 2018-01-19 MED ORDER — IBUPROFEN 800 MG PO TABS
800.0000 mg | ORAL_TABLET | Freq: Once | ORAL | Status: AC
  Administered 2018-01-19: 800 mg via ORAL
  Filled 2018-01-19: qty 1

## 2018-01-19 MED ORDER — CYCLOBENZAPRINE HCL 5 MG PO TABS: 5.0000 mg | ORAL_TABLET | Freq: Three times a day (TID) | ORAL | 0 refills | Status: DC | PRN

## 2018-01-19 NOTE — Telephone Encounter (Signed)
Pt states that she is still having really bad headaches. She states that the medication that was prescribed to her is not working. She has been taking tylenol, and was just started on fiorcet with no relief. Pt advised to go to MAU for evaluation.

## 2018-01-19 NOTE — MAU Note (Signed)
Urine in lab 

## 2018-01-19 NOTE — Telephone Encounter (Signed)
Pt called and left message stating that she is still having headaches. Unable to reach patient. Left message on vm for her to return call to the office

## 2018-01-19 NOTE — Progress Notes (Addendum)
G9P3 @ 14.[redacted] wksga. Here for the same thing yesterday headache. States was given tylenol and helped a little but ha cont at home. Took tylenol 45 mins ago but Ha pain currently is 7/10.   Pt ate at 1100 hamburger and fries before coming in to MAU  Denies LOF or bleeding.   Doppler: 147  No symptoms of PIH except for the ha that started yesterday  1217: urine not ordered but will confirm with provider if urine needs to be done since done yesterday.  1220: orders received for motrin and monitor pain in an hr of motrin administration. NO need for repeat urine ordered per provider since urine done yesterday  1225: medicated per order  1337: provider made aware of pt's ha pain decreased from 7 to 4. Ok to given pt crackers. Crackers given.   1405: d/c orders received 1408: d/c instructions given with pt understanding. Pt left unit via ambulatory.

## 2018-01-19 NOTE — MAU Provider Note (Signed)
History     CSN: [id number]  Arrival date and time: 01/19/18 1103   First Provider Initiated Contact with Patient 01/19/18 1207      Chief Complaint  Patient presents with  . Headache   HPI Brittany [name] is a 37 y.o. [id number] at [redacted]w[redacted]d who presents with headache. Has had constant headache since Tuesday. Reports having headache like this with previous pregnancies but this is her first one with current pregnancy. Pain in frontal region. Rates 9/10. Pain does not radiate. Endorses photophobia. No fever/chills, tinnitus, head trauma, n/v. Has been taking fioricet & tylenol without relief. Was prescribed fioricet by ob/gyn when she called in about headache & has been taking it every 6 hours since Wednesday.   OB History    Gravida  9   Para  3   Term  2   Preterm  1   AB  5   Living  3     SAB  4   TAB  1   Ectopic  0   Multiple  0   Live Births  3           Past Medical History:  Diagnosis Date  . Gallstone   . Headache(784.0)   . Hypertension   . Maternal chronic hypertension in first trimester 04/09/2011  . Pregnancy induced hypertension    after 2nd delivery  . Vaginal Pap smear, abnormal    cant remember    Past Surgical History:  Procedure Laterality Date  . DILATION AND CURETTAGE OF UTERUS     dont remember year  . LEEP      Family History  Problem Relation Age of Onset  . Asthma Mother   . Hypertension Mother   . Asthma Maternal Grandmother   . Hypertension Maternal Grandmother     Social History   Tobacco Use  . Smoking status: Never Smoker  . Smokeless tobacco: Never Used  Substance Use Topics  . Alcohol use: No    Alcohol/week: 0.0 oz  . Drug use: No    Allergies: No Known Allergies  Medications Prior to Admission  Medication Sig Dispense Refill Last Dose  . aspirin EC 81 MG tablet Take 1 tablet (81 mg total) by mouth daily. Take after 12 weeks for prevention of preeclampsia later in pregnancy 300 tablet 2   .  Butalbital-APAP-Caffeine 50-325-40 MG capsule Take 1 capsule by mouth every 6 (six) hours as needed for headache. 20 capsule 0   . cyclobenzaprine (FLEXERIL) 5 MG tablet Take 2 tablets (10 mg total) by mouth 3 (three) times daily as needed for muscle spasms. 50 tablet 0 Taking  . docusate sodium (COLACE) 100 MG capsule Take 1 capsule (100 mg total) by mouth 2 (two) times daily. 10 capsule 0 Taking  . Doxylamine-Pyridoxine ER (BONJESTA) 20-20 MG TBCR Take 1 tablet by mouth 2 (two) times daily. 60 tablet 6 Taking  . metoCLOPramide (REGLAN) 10 MG tablet Take 1 tablet (10 mg total) by mouth every 6 (six) hours. 30 tablet 0 Taking  . ondansetron (ZOFRAN ODT) 4 MG disintegrating tablet Take 1 tablet (4 mg total) by mouth every 6 (six) hours as needed for nausea. 20 tablet 0 Taking  . Prenatal Vit-Fe Fumarate-FA (PRENATAL MULTIVITAMIN) TABS tablet Take 1 tablet by mouth daily at 12 noon.   Taking  . promethazine (PHENERGAN) 12.5 MG tablet Take 12.5 mg by mouth every 6 (six) hours as needed for nausea or vomiting.   Taking  . Vitamin D, Ergocalciferol, (  DRISDOL) 50000 units CAPS capsule Take 1 capsule (50,000 Units total) by mouth every 7 (seven) days. 30 capsule 2 Taking    Review of Systems  Constitutional: Negative.   Eyes: Positive for photophobia. Negative for pain and visual disturbance.  Respiratory: Negative.   Gastrointestinal: Negative.   Neurological: Positive for headaches. Negative for dizziness and syncope.   Physical Exam   Blood pressure 125/73, pulse 85, temperature 98.9 F (37.2 C), temperature source Oral, resp. rate 18, weight 178 lb 12 oz (81.1 kg), last menstrual period 09/25/2017, SpO2 98 %, currently breastfeeding.  Physical Exam  Nursing note and vitals reviewed. Constitutional: She is oriented to person, place, and time. She appears well-developed and well-nourished. No distress.  HENT:  Head: Normocephalic and atraumatic.  Eyes: Pupils are equal, round, and reactive to  light. Conjunctivae and EOM are normal. Right eye exhibits no discharge. Left eye exhibits no discharge. No scleral icterus.  Neck: Normal range of motion.  Cardiovascular: Normal rate, regular rhythm and normal heart sounds.  No murmur heard. Respiratory: Effort normal and breath sounds normal. No respiratory distress. She has no wheezes.  GI: Soft. There is no tenderness.  Neurological: She is alert and oriented to person, place, and time. No cranial nerve deficit.  Skin: Skin is warm and dry. She is not diaphoretic.  Psychiatric: She has a normal mood and affect. Her behavior is normal. Judgment and thought content normal.    MAU Course  Procedures No results found for this or any previous visit (from the past 24 hour(s)).  MDM FHT 147 by doppler VSS Normal neuro exam Pt drove self here & does not have ride home. Discussed options for treating pain here. Agreeable to dose of ibuprofen. Pain down to 4 from 9 after dose of ibuprofen.  Discussed changing meds for headache & if headaches continue may need [facility]. Pt agreeable with plan.   Assessment and Plan  A: 1. Pregnancy headache in second trimester   2. [redacted] weeks gestation of pregnancy    P: Discharge home D/c fioricet Ibuprofen x 48 hrs Rx flexeril Discussed reasons to return to MAU  Judeth HornErin Dutch Ing 01/19/2018, 12:07 PM

## 2018-01-19 NOTE — Discharge Instructions (Signed)
General Headache Without Cause A headache is pain or discomfort felt around the head or neck area. The specific cause of a headache may not be found. There are many causes and types of headaches. A few common ones are:  Tension headaches.  Migraine headaches.  Cluster headaches.  Chronic daily headaches.  Follow these instructions at home: Watch your condition for any changes. Take these steps to help with your condition: Managing pain  Take over-the-counter and prescription medicines only as told by your health care provider.  Lie down in a dark, quiet room when you have a headache.  If directed, apply ice to the head and neck area: ? Put ice in a plastic bag. ? Place a towel between your skin and the bag. ? Leave the ice on for 20 minutes, 2-3 times per day.  Use a heating pad or hot shower to apply heat to the head and neck area as told by your health care provider.  Keep lights dim if bright lights bother you or make your headaches worse. Eating and drinking  Eat meals on a regular schedule.  Limit alcohol use.  Decrease the amount of caffeine you drink, or stop drinking caffeine. General instructions  Keep all follow-up visits as told by your health care provider. This is important.  Keep a headache journal to help find out what may trigger your headaches. For example, write down: ? What you eat and drink. ? How much sleep you get. ? Any change to your diet or medicines.  Try massage or other relaxation techniques.  Limit stress.  Sit up straight, and do not tense your muscles.  Do not use tobacco products, including cigarettes, chewing tobacco, or e-cigarettes. If you need help quitting, ask your health care provider.  Exercise regularly as told by your health care provider.  Sleep on a regular schedule. Get 7-9 hours of sleep, or the amount recommended by your health care provider. Contact a health care provider if:  Your symptoms are not helped by  medicine.  You have a headache that is different from the usual headache.  You have nausea or you vomit.  You have a fever. Get help right away if:  Your headache becomes severe.  You have repeated vomiting.  You have a stiff neck.  You have a loss of vision.  You have problems with speech.  You have pain in the eye or ear.  You have muscular weakness or loss of muscle control.  You lose your balance or have trouble walking.  You feel faint or pass out.  You have confusion. This information is not intended to replace advice given to you by your health care provider. Make sure you discuss any questions you have with your health care provider. Document Released: 07/04/2005 Document Revised: 12/10/2015 Document Reviewed: 10/27/2014 Elsevier Interactive Patient Education  2018 [employer]  

## 2018-01-19 NOTE — MAU Note (Signed)
Still having headaches. Was here yesterday for same.  Called dr and they told her she needed to come back in and be evaluated. Took new meds and they are not helping.

## 2018-01-31 ENCOUNTER — Inpatient Hospital Stay (HOSPITAL_COMMUNITY)
Admission: AD | Admit: 2018-01-31 | Discharge: 2018-01-31 | Disposition: A | Discharge status: Home / Self Care | Payer: Medicaid Other | Source: Ambulatory Visit | Attending: Obstetrics & Gynecology | Admitting: Obstetrics & Gynecology

## 2018-01-31 ENCOUNTER — Encounter (HOSPITAL_COMMUNITY): Payer: Self-pay

## 2018-01-31 DIAGNOSIS — Z7982 Long term (current) use of aspirin: Secondary | ICD-10-CM | POA: Insufficient documentation

## 2018-01-31 DIAGNOSIS — R51 Headache: Secondary | ICD-10-CM | POA: Diagnosis not present

## 2018-01-31 DIAGNOSIS — Z79899 Other long term (current) drug therapy: Secondary | ICD-10-CM | POA: Insufficient documentation

## 2018-01-31 DIAGNOSIS — O26899 Other specified pregnancy related conditions, unspecified trimester: Secondary | ICD-10-CM

## 2018-01-31 DIAGNOSIS — Z825 Family history of asthma and other chronic lower respiratory diseases: Secondary | ICD-10-CM | POA: Diagnosis not present

## 2018-01-31 DIAGNOSIS — O26852 Spotting complicating pregnancy, second trimester: Secondary | ICD-10-CM | POA: Insufficient documentation

## 2018-01-31 DIAGNOSIS — R519 Headache, unspecified: Secondary | ICD-10-CM

## 2018-01-31 DIAGNOSIS — O26892 Other specified pregnancy related conditions, second trimester: Secondary | ICD-10-CM | POA: Diagnosis not present

## 2018-01-31 DIAGNOSIS — Z8249 Family history of ischemic heart disease and other diseases of the circulatory system: Secondary | ICD-10-CM | POA: Insufficient documentation

## 2018-01-31 DIAGNOSIS — R102 Pelvic and perineal pain: Secondary | ICD-10-CM | POA: Diagnosis present

## 2018-01-31 DIAGNOSIS — O26859 Spotting complicating pregnancy, unspecified trimester: Secondary | ICD-10-CM

## 2018-01-31 DIAGNOSIS — Z3A16 16 weeks gestation of pregnancy: Secondary | ICD-10-CM | POA: Diagnosis not present

## 2018-01-31 LAB — URINALYSIS, ROUTINE W REFLEX MICROSCOPIC
BILIRUBIN URINE: NEGATIVE
GLUCOSE, UA: NEGATIVE mg/dL
HGB URINE DIPSTICK: NEGATIVE
KETONES UR: NEGATIVE mg/dL
LEUKOCYTES UA: NEGATIVE
Nitrite: NEGATIVE
PROTEIN: NEGATIVE mg/dL
Specific Gravity, Urine: 1.016 (ref 1.005–1.030)
pH: 6 (ref 5.0–8.0)

## 2018-01-31 LAB — WET PREP, GENITAL
CLUE CELLS WET PREP: NONE SEEN
Sperm: NONE SEEN
Trich, Wet Prep: NONE SEEN
Yeast Wet Prep HPF POC: NONE SEEN

## 2018-01-31 MED ORDER — IBUPROFEN 800 MG PO TABS
800.0000 mg | ORAL_TABLET | Freq: Once | ORAL | Status: AC
  Administered 2018-01-31: 800 mg via ORAL
  Filled 2018-01-31: qty 1

## 2018-01-31 NOTE — MAU Note (Signed)
Pt C/O HA, lower back pain, lower abd cramping & spotting.  All sx's started today.  Denies vomiting or diarrhea.

## 2018-01-31 NOTE — MAU Provider Note (Signed)
History     CSN: [id number]  Arrival date and time: 01/31/18 1509   First Provider Initiated Contact with Patient 01/31/18 1714      Chief Complaint  Patient presents with  . Pelvic Pain  . Back Pain  . Headache  . Vaginal Bleeding   Brittany [name] is a 37 y.o. [phone] at [redacted]w[redacted]d who presents today with cramping, spotting, back pain and headache.  Pelvic Pain  The patient's primary symptoms include pelvic pain. The patient's pertinent negatives include no vaginal discharge. This is a new problem. The current episode started today. The problem has been unchanged (spotting has stopped, but pain is the same. ). Pain severity now: 8/10  The problem affects both sides. She is pregnant. Pertinent negatives include no chills, constipation, diarrhea, dysuria, fever, frequency, nausea or vomiting. The vaginal bleeding is spotting. Nothing aggravates the symptoms. She has tried nothing for the symptoms. Sexual activity: last intercourse 2 weeks ago      Past Medical History:  Diagnosis Date  . Gallstone   . Headache(784.0)   . Hypertension   . Maternal chronic hypertension in first trimester 04/09/2011  . Pregnancy induced hypertension    after 2nd delivery  . Vaginal Pap smear, abnormal    cant remember    Past Surgical History:  Procedure Laterality Date  . DILATION AND CURETTAGE OF UTERUS     dont remember year  . LEEP      Family History  Problem Relation Age of Onset  . Asthma Mother   . Hypertension Mother   . Asthma Maternal Grandmother   . Hypertension Maternal Grandmother     Social History   Tobacco Use  . Smoking status: Never Smoker  . Smokeless tobacco: Never Used  Substance Use Topics  . Alcohol use: No    Alcohol/week: 0.0 oz  . Drug use: No    Allergies: No Known Allergies  Medications Prior to Admission  Medication Sig Dispense Refill Last Dose  . aspirin EC 81 MG tablet Take 1 tablet (81 mg total) by mouth daily. Take after 12 weeks for  prevention of preeclampsia later in pregnancy 300 tablet 2   . cyclobenzaprine (FLEXERIL) 5 MG tablet Take 1 tablet (5 mg total) by mouth 3 (three) times daily as needed for muscle spasms. 30 tablet 0   . docusate sodium (COLACE) 100 MG capsule Take 1 capsule (100 mg total) by mouth 2 (two) times daily. 10 capsule 0 Taking  . Doxylamine-Pyridoxine ER (BONJESTA) 20-20 MG TBCR Take 1 tablet by mouth 2 (two) times daily. 60 tablet 6 Taking  . metoCLOPramide (REGLAN) 10 MG tablet Take 1 tablet (10 mg total) by mouth every 6 (six) hours. 30 tablet 0 Taking  . ondansetron (ZOFRAN ODT) 4 MG disintegrating tablet Take 1 tablet (4 mg total) by mouth every 6 (six) hours as needed for nausea. 20 tablet 0 Taking  . Prenatal Vit-Fe Fumarate-FA (PRENATAL MULTIVITAMIN) TABS tablet Take 1 tablet by mouth daily at 12 noon.   Taking  . promethazine (PHENERGAN) 12.5 MG tablet Take 12.5 mg by mouth every 6 (six) hours as needed for nausea or vomiting.   Taking  . Vitamin D, Ergocalciferol, (DRISDOL) 50000 units CAPS capsule Take 1 capsule (50,000 Units total) by mouth every 7 (seven) days. 30 capsule 2 Taking    Review of Systems  Constitutional: Negative for chills and fever.  Gastrointestinal: Negative for constipation, diarrhea, nausea and vomiting.  Genitourinary: Positive for pelvic pain and vaginal bleeding.  Negative for dysuria, frequency and vaginal discharge.   Physical Exam   Blood pressure 121/74, pulse 86, temperature 98.7 F (37.1 C), temperature source Oral, resp. rate 16, height 5\' 5"  (1.651 m), weight 176 lb 1.9 oz (79.9 kg), last menstrual period 09/25/2017, currently breastfeeding.  Physical Exam  Nursing note and vitals reviewed. Constitutional: She is oriented to person, place, and time. She appears well-developed and well-nourished. No distress.  HENT:  Head: Normocephalic.  Cardiovascular: Normal rate.  Respiratory: Effort normal.  GI: Soft. There is no tenderness. There is no rebound.   Genitourinary:  Genitourinary Comments:  External: no lesion Vagina: small amount of white discharge Cervix: pink, smooth, no CMT Uterus: AGA   Neurological: She is alert and oriented to person, place, and time.  Skin: Skin is warm and dry.  Psychiatric: She has a normal mood and affect.  FHT 153 with doppler   Pt informed that the ultrasound is considered a limited OB ultrasound and is not intended to be a complete ultrasound exam.  Patient also informed that the ultrasound is not being completed with the intent of assessing for fetal or placental anomalies or any pelvic abnormalities.  Explained that the purpose of today's ultrasound is to assess for  viability.  Patient acknowledges the purpose of the exam and the limitations of the study.       Results for orders placed or performed during the hospital encounter of 01/31/18 (from the past 24 hour(s))  Urinalysis, Routine w reflex microscopic     Status: None   Collection Time: 01/31/18  3:49 PM  Result Value Ref Range   Color, Urine YELLOW YELLOW   APPearance CLEAR CLEAR   Specific Gravity, Urine 1.016 1.005 - 1.030   pH 6.0 5.0 - 8.0   Glucose, UA NEGATIVE NEGATIVE mg/dL   Hgb urine dipstick NEGATIVE NEGATIVE   Bilirubin Urine NEGATIVE NEGATIVE   Ketones, ur NEGATIVE NEGATIVE mg/dL   Protein, ur NEGATIVE NEGATIVE mg/dL   Nitrite NEGATIVE NEGATIVE   Leukocytes, UA NEGATIVE NEGATIVE  Wet prep, genital     Status: Abnormal   Collection Time: 01/31/18  5:39 PM  Result Value Ref Range   Yeast Wet Prep HPF POC NONE SEEN NONE SEEN   Trich, Wet Prep NONE SEEN NONE SEEN   Clue Cells Wet Prep HPF POC NONE SEEN NONE SEEN   WBC, Wet Prep HPF POC MODERATE (A) NONE SEEN   Sperm NONE SEEN     MAU Course  Procedures  MDM Patient has had 800mg  ibuprofen, and reports that her headache has improved. Reassured with +FHT on US. No bleeding seen on pelvic exam.   Assessment and Plan   1. Pregnancy headache in second trimester   2.  Spotting in pregnancy   3. Pain of round ligament affecting pregnancy, antepartum   4. [redacted] weeks gestation of pregnancy    DC home Comfort measures reviewed  2nd/3rd Trimester precautions  Bleeding precautions PTL precautions  Fetal kick counts RX: no new rx, continue current meds  Return to MAU as needed FU with OB as planned  Follow-up Information    [facility] Follow up.   Contact information: [address] [place] [phone]           Brittany ShellerHeather Marykatherine Sherwood 01/31/2018, 5:23 PM

## 2018-01-31 NOTE — Discharge Instructions (Signed)

## 2018-01-31 NOTE — MAU Provider Note (Signed)
History     CSN: [id number]  Arrival date and time: 01/31/18 1509   First Provider Initiated Contact with Patient 01/31/18 1714      Chief Complaint  Patient presents with  . Abdominal Pain  . Back Pain  . Headache  . Vaginal Bleeding   Brittany [name] is a 37 yo F [phone] at [redacted]w[redacted]d presenting for back pain, HA, spotting and cramping starting this morning. She states that her back pain is most bothersome and it is an 8/10 on pain scale. Pain is constant and has stayed the same the entire day. Spotting started around 10 am this morning and has since stopped completely. No hx of back injury. Taking tylenol prior for headache but hasn't seemed to help. Denies vaginal discharge, burning, itching. +FM, No LOF. Last intercourse 2 weeks ago.      OB History    Gravida  9   Para  3   Term  2   Preterm  1   AB  5   Living  3     SAB  4   TAB  1   Ectopic  0   Multiple  0   Live Births  3           Past Medical History:  Diagnosis Date  . Gallstone   . Headache(784.0)   . Hypertension   . Maternal chronic hypertension in first trimester 04/09/2011  . Pregnancy induced hypertension    after 2nd delivery  . Vaginal Pap smear, abnormal    cant remember    Past Surgical History:  Procedure Laterality Date  . DILATION AND CURETTAGE OF UTERUS     dont remember year  . LEEP      Family History  Problem Relation Age of Onset  . Asthma Mother   . Hypertension Mother   . Asthma Maternal Grandmother   . Hypertension Maternal Grandmother     Social History   Tobacco Use  . Smoking status: Never Smoker  . Smokeless tobacco: Never Used  Substance Use Topics  . Alcohol use: No    Alcohol/week: 0.0 oz  . Drug use: No    Allergies: No Known Allergies  Medications Prior to Admission  Medication Sig Dispense Refill Last Dose  . aspirin EC 81 MG tablet Take 1 tablet (81 mg total) by mouth daily. Take after 12 weeks for prevention of preeclampsia later in  pregnancy 300 tablet 2   . cyclobenzaprine (FLEXERIL) 5 MG tablet Take 1 tablet (5 mg total) by mouth 3 (three) times daily as needed for muscle spasms. 30 tablet 0   . docusate sodium (COLACE) 100 MG capsule Take 1 capsule (100 mg total) by mouth 2 (two) times daily. 10 capsule 0 Taking  . Doxylamine-Pyridoxine ER (BONJESTA) 20-20 MG TBCR Take 1 tablet by mouth 2 (two) times daily. 60 tablet 6 Taking  . metoCLOPramide (REGLAN) 10 MG tablet Take 1 tablet (10 mg total) by mouth every 6 (six) hours. 30 tablet 0 Taking  . ondansetron (ZOFRAN ODT) 4 MG disintegrating tablet Take 1 tablet (4 mg total) by mouth every 6 (six) hours as needed for nausea. 20 tablet 0 Taking  . Prenatal Vit-Fe Fumarate-FA (PRENATAL MULTIVITAMIN) TABS tablet Take 1 tablet by mouth daily at 12 noon.   Taking  . promethazine (PHENERGAN) 12.5 MG tablet Take 12.5 mg by mouth every 6 (six) hours as needed for nausea or vomiting.   Taking  . Vitamin D, Ergocalciferol, (DRISDOL) 50000 units CAPS  capsule Take 1 capsule (50,000 Units total) by mouth every 7 (seven) days. 30 capsule 2 Taking    Review of Systems  Constitutional: Negative for fever.  Gastrointestinal: Positive for nausea. Negative for abdominal pain, constipation, diarrhea and vomiting.  Genitourinary: Negative for difficulty urinating and hematuria.  Musculoskeletal: Positive for back pain.  Neurological: Positive for headaches.   Physical Exam   Blood pressure 121/74, pulse 86, temperature 98.7 F (37.1 C), temperature source Oral, resp. rate 16, height 5\' 5"  (1.651 m), weight 79.9 kg (176 lb 1.9 oz), last menstrual period 09/25/2017, currently breastfeeding.  Physical Exam  Constitutional: She appears well-developed and well-nourished. No distress.  HENT:  Head: Normocephalic and atraumatic.  Eyes: Conjunctivae are normal.  Cardiovascular: Normal rate and normal heart sounds.  Respiratory: Effort normal and breath sounds normal. No respiratory distress.   GI: Soft. She exhibits no distension. There is tenderness.  Mild TTP along lower abdominal quadrants  Genitourinary: Vagina normal. Cervix exhibits no motion tenderness, no discharge and no friability. Right adnexum displays no mass and no tenderness. Left adnexum displays no mass and no tenderness.   Recent Results (from the past 2160 hour(s))  Urinalysis, Routine w reflex microscopic     Status: None   Collection Time: 01/31/18  3:49 PM  Result Value Ref Range   Color, Urine YELLOW YELLOW   APPearance CLEAR CLEAR   Specific Gravity, Urine 1.016 1.005 - 1.030   pH 6.0 5.0 - 8.0   Glucose, UA NEGATIVE NEGATIVE mg/dL   Hgb urine dipstick NEGATIVE NEGATIVE   Bilirubin Urine NEGATIVE NEGATIVE   Ketones, ur NEGATIVE NEGATIVE mg/dL   Protein, ur NEGATIVE NEGATIVE mg/dL   Nitrite NEGATIVE NEGATIVE   Leukocytes, UA NEGATIVE NEGATIVE    Comment: Performed at [facility], [address] [place], [place] [address]  Wet prep, genital     Status: Abnormal   Collection Time: 01/31/18  5:39 PM  Result Value Ref Range   Yeast Wet Prep HPF POC NONE SEEN NONE SEEN   Trich, Wet Prep NONE SEEN NONE SEEN   Clue Cells Wet Prep HPF POC NONE SEEN NONE SEEN   WBC, Wet Prep HPF POC MODERATE (A) NONE SEEN   Sperm NONE SEEN     Comment: Performed at [facility], [address] [place], [place] [address]     MAU Course  Procedures  Fetal heart tones Bedside U/S UA Wet prep G/C  MDM Bedside U/S ruled out poor fetal well being. Results of wet prep and UA were all negative ruling out UTI and other vaginal infections. G/C still pending. No bleeding on pelvic exam. Pt stable to go home.  Assessment and Plan   1. Pregnancy headache in second trimester   2. Spotting in pregnancy   3. Pain of round ligament affecting pregnancy, antepartum   4. [redacted] weeks gestation of pregnancy    DC home Pain management administered- 800 Mg Strict return precautions Fu with outpt OB Return to MAU as  needed Continue current medications  Rolland BimlerLexi J Monay Houlton MS3 01/31/2018, 5:16 PM

## 2018-02-01 LAB — GC/CHLAMYDIA PROBE AMP (~~LOC~~) NOT AT ARMC
Chlamydia: NEGATIVE
Neisseria Gonorrhea: NEGATIVE

## 2018-02-05 ENCOUNTER — Encounter (HOSPITAL_COMMUNITY): Payer: Self-pay

## 2018-02-06 ENCOUNTER — Encounter: Payer: Medicaid Other | Admitting: Obstetrics & Gynecology

## 2018-02-06 ENCOUNTER — Ambulatory Visit (INDEPENDENT_AMBULATORY_CARE_PROVIDER_SITE_OTHER): Payer: Medicaid Other | Admitting: Obstetrics & Gynecology

## 2018-02-06 VITALS — BP 135/88 | HR 90 | Wt 179.2 lb

## 2018-02-06 DIAGNOSIS — O09899 Supervision of other high risk pregnancies, unspecified trimester: Secondary | ICD-10-CM

## 2018-02-06 DIAGNOSIS — O10919 Unspecified pre-existing hypertension complicating pregnancy, unspecified trimester: Secondary | ICD-10-CM

## 2018-02-06 DIAGNOSIS — O09212 Supervision of pregnancy with history of pre-term labor, second trimester: Secondary | ICD-10-CM

## 2018-02-06 DIAGNOSIS — O09292 Supervision of pregnancy with other poor reproductive or obstetric history, second trimester: Secondary | ICD-10-CM

## 2018-02-06 DIAGNOSIS — O09219 Supervision of pregnancy with history of pre-term labor, unspecified trimester: Secondary | ICD-10-CM

## 2018-02-06 DIAGNOSIS — Z9889 Other specified postprocedural states: Secondary | ICD-10-CM

## 2018-02-06 DIAGNOSIS — R51 Headache: Secondary | ICD-10-CM

## 2018-02-06 DIAGNOSIS — O09299 Supervision of pregnancy with other poor reproductive or obstetric history, unspecified trimester: Secondary | ICD-10-CM

## 2018-02-06 DIAGNOSIS — O26899 Other specified pregnancy related conditions, unspecified trimester: Secondary | ICD-10-CM | POA: Insufficient documentation

## 2018-02-06 DIAGNOSIS — O0990 Supervision of high risk pregnancy, unspecified, unspecified trimester: Secondary | ICD-10-CM

## 2018-02-06 DIAGNOSIS — O26892 Other specified pregnancy related conditions, second trimester: Secondary | ICD-10-CM

## 2018-02-06 DIAGNOSIS — O10912 Unspecified pre-existing hypertension complicating pregnancy, second trimester: Secondary | ICD-10-CM

## 2018-02-06 DIAGNOSIS — O0992 Supervision of high risk pregnancy, unspecified, second trimester: Secondary | ICD-10-CM

## 2018-02-06 MED ORDER — HYDROXYPROGESTERONE CAPROATE 275 MG/1.1ML ~~LOC~~ SOAJ
275.0000 mg | Freq: Once | SUBCUTANEOUS | Status: AC
  Administered 2018-02-06: 275 mg via SUBCUTANEOUS

## 2018-02-06 MED ORDER — SUMATRIPTAN SUCCINATE 100 MG PO TABS: ORAL_TABLET | ORAL | 1 refills | Status: DC

## 2018-02-06 NOTE — Patient Instructions (Signed)
Cluster Headache A cluster headache is a type of headache that causes deep, intense head pain. Cluster headaches can last from 15 minutes to 3 hours. They usually occur:  On one side of the head. They may occur on the other side when a new cluster of headaches begins.  Repeatedly over weeks to months.  Several times a day.  At the same time of day, often at night.  More often in the fall and springtime.  What are the causes? The cause of this condition is not known. What increases the risk? This condition is more likely to develop in:  Males.  People who drink alcohol.  People who smoke or use products that contain nicotine or tobacco.  People who take medicines that cause blood vessels to expand, such as nitroglycerin.  People who take antihistamines.  What are the signs or symptoms? Symptoms of this condition include:  Severe pain on one side of the head that begins behind or around your eye or temple.  Pain on one side of the head.  Nausea.  Sensitivity to light.  Runny nose and nasal stuffiness.  Sweaty, pale skin on the face.  Droopy or swollen eyelid, eye redness, or tearing.  Restlessness and agitation.  How is this diagnosed? This condition may be diagnosed based on:  Your symptoms.  A physical exam.  Your health care provider may order tests to see if your headaches are caused by another medical condition. These tests may show that you do not have cluster headaches. Tests may include:  A CT scan of your head.  An MRI of your head.  Lab tests.  How is this treated? This condition may be treated with:  Medicines to relieve pain and to prevent repeated (recurrent) attacks. Some people may need a combination of medicines.  Oxygen. This helps to relieve pain.  Follow these instructions at home: Headache diary Keep a headache diary as told by your health care provider. Doing this can help you and your health care provider figure out what  triggers your headaches. In your headache diary, include information about:  The time of day that your headache started and what you were doing when it began.  How long your headache lasted.  Where your pain started and whether it moved to other areas.  The type of pain, such as burning, stabbing, throbbing, or cramping.  Your level of pain. Use a pain scale and rate the pain with a number from 1 (mild) up to 10 (severe).  The treatment that you used, and any change in symptoms after treatment.  Medicines  Take over-the-counter and prescription medicines only as told by your health care provider.  Do not drive or use heavy machinery while taking prescription pain medicine.  Use oxygen as told by your health care provider. Lifestyle  Follow a regular sleep schedule. Do not vary the time that you go to bed or the amount that you sleep from day to day. It is important to stay on the same schedule during a cluster period to help prevent headaches.  Exercise regularly.  Eat a healthy diet and avoid foods that may trigger your headaches.  Avoid alcohol.  Do not use any products that contain nicotine or tobacco, such as cigarettes and e-cigarettes. If you need help quitting, ask your health care provider. Contact a health care provider if:  Your headaches change, become more severe, or occur more often.  The medicine or oxygen that your health care provider recommended does  not help. Get help right away if:  You faint.  You have weakness or numbness, especially on one side of your body or face.  You have double vision.  You have nausea or vomiting that does not go away within several hours.  You have trouble talking, walking, or keeping your balance.  You have pain or stiffness in your neck.  You have a fever. Summary  A cluster headache is a type of headache that causes deep, intense head pain, usually on one side of the head.  Keep a headache diary to help discover  what triggers your headaches.  A regular sleep schedule can help prevent headaches. This information is not intended to replace advice given to you by your health care provider. Make sure you discuss any questions you have with your health care provider. Document Released: 07/04/2005 Document Revised: 03/15/2016 Document Reviewed: 03/15/2016 Elsevier Interactive Patient Education  2018 [employer] General Headache Without Cause A headache is pain or discomfort felt around the head or neck area. The specific cause of a headache may not be found. There are many causes and types of headaches. A few common ones are:  Tension headaches.  Migraine headaches.  Cluster headaches.  Chronic daily headaches.  Follow these instructions at home: Watch your condition for any changes. Take these steps to help with your condition: Managing pain  Take over-the-counter and prescription medicines only as told by your health care provider.  Lie down in a dark, quiet room when you have a headache.  If directed, apply ice to the head and neck area: ? Put ice in a plastic bag. ? Place a towel between your skin and the bag. ? Leave the ice on for 20 minutes, 2-3 times per day.  Use a heating pad or hot shower to apply heat to the head and neck area as told by your health care provider.  Keep lights dim if bright lights bother you or make your headaches worse. Eating and drinking  Eat meals on a regular schedule.  Limit alcohol use.  Decrease the amount of caffeine you drink, or stop drinking caffeine. General instructions  Keep all follow-up visits as told by your health care provider. This is important.  Keep a headache journal to help find out what may trigger your headaches. For example, write down: ? What you eat and drink. ? How much sleep you get. ? Any change to your diet or medicines.  Try massage or other relaxation techniques.  Limit stress.  Sit up straight, and do not  tense your muscles.  Do not use tobacco products, including cigarettes, chewing tobacco, or e-cigarettes. If you need help quitting, ask your health care provider.  Exercise regularly as told by your health care provider.  Sleep on a regular schedule. Get 7-9 hours of sleep, or the amount recommended by your health care provider. Contact a health care provider if:  Your symptoms are not helped by medicine.  You have a headache that is different from the usual headache.  You have nausea or you vomit.  You have a fever. Get help right away if:  Your headache becomes severe.  You have repeated vomiting.  You have a stiff neck.  You have a loss of vision.  You have problems with speech.  You have pain in the eye or ear.  You have muscular weakness or loss of muscle control.  You lose your balance or have trouble walking.  You feel faint or pass out.  You have confusion. This information is not intended to replace advice given to you by your health care provider. Make sure you discuss any questions you have with your health care provider. Document Released: 07/04/2005 Document Revised: 12/10/2015 Document Reviewed: 10/27/2014 Elsevier Interactive Patient Education  [employer].

## 2018-02-06 NOTE — Addendum Note (Signed)
Addended by: Frutoso ChaseOX, Corydon Schweiss on: 02/06/2018 10:22 AM   Modules accepted: Orders

## 2018-02-06 NOTE — Progress Notes (Signed)
   PRENATAL VISIT NOTE  Subjective:  Brittany [name] is a 37 y.o. [phone] at 623w1d being seen today for ongoing prenatal care.  She is currently monitored for the following issues for this high-risk pregnancy and has HTN in pregnancy, chronic; Subclinical hyperthyroidism; Migraine; Murmur, cardiac; Anemia; Supervision of high risk pregnancy, antepartum; Elevated ALT measurement; Vitamin D deficiency; UTI (urinary tract infection) during pregnancy, first trimester; H/O LEEP; H/O intrauterine growth restriction in prior pregnancy, currently pregnant; H/O pre-eclampsia in prior pregnancy, currently pregnant; H/O preterm delivery, currently pregnant; and Headache in pregnancy, antepartum on their problem list.  Patient reports severe HA. Not responding to Flexeril, Tylenol or Fioricet  She reports having HA every pregnancy and ONLY in her pregnancies. She has never found relief with any tx and expresses frustration.  Contractions: Not present. Vag. Bleeding: None.  Movement: Present. Denies leaking of fluid.   The following portions of the patient's history were reviewed and updated as appropriate: allergies, current medications, past family history, past medical history, past social history, past surgical history and problem list. Problem list updated.  Objective:   Vitals:   02/06/18 0935 02/06/18 0937  BP: (!) 143/88 135/88  Pulse: 91 90  Weight: 179 lb 3.2 oz (81.3 kg)     Fetal Status: Fetal Heart Rate (bpm): 152   Movement: Present     General:  Alert, oriented and cooperative. Patient is in no acute distress.  Skin: Skin is warm and dry. No rash noted.   Cardiovascular: Normal heart rate noted  Respiratory: Normal respiratory effort, no problems with respiration noted  Abdomen: Soft, gravid, appropriate for gestational age.  Pain/Pressure: Present     Pelvic: Cervical exam deferred        Extremities: Normal range of motion.  Edema: None  Mental Status: Normal mood and affect. Normal  behavior. Normal judgment and thought content.   Assessment and Plan:  Pregnancy: [id number] at 363w1d  1. Supervision of high risk pregnancy, antepartum Ha US scheduled 7/29  2. HTN in pregnancy, chronic On ASA  3. H/O preterm delivery, currently pregnant Pt to start 17-OH-P  4. H/O pre-eclampsia in prior pregnancy, currently pregnant Keep baby ASA daily  5. H/O LEEP US to check cervical length   6. H/O intrauterine growth restriction in prior pregnancy, currently pregnant  7. HA in pregnancy  Will begin Imitrex for severe HA only. Pt prescribed 100mg  tablets to take 1/4 tablet with Flexeril with HA. I reviewed the potential risks of med with pt    Preterm labor symptoms and general obstetric precautions including but not limited to vaginal bleeding, contractions, leaking of fluid and fetal movement were reviewed in detail with the patient. Please refer to After Visit Summary for other counseling recommendations.  Return in about 1 month (around 03/06/2018).  Future Appointments  Date Time Provider Department Center  02/12/2018 10:30 AM [facility] US 1 [facility]  10/08/2018 10:00 AM Evaristo BuryShambley, Ashleigh N, NP [facility]    Willodean Rosenthalarolyn Harraway-Smith, MD

## 2018-02-08 LAB — AFP, SERUM, OPEN SPINA BIFIDA
AFP MoM: 1.3
AFP Value: 47.9 ng/mL
GEST. AGE ON COLLECTION DATE: 17.1 wk
Maternal Age At EDD: 35.3 yr
OSBR Risk 1 IN: 9606
TEST RESULTS AFP: NEGATIVE
Weight: 179 [lb_av]

## 2018-02-10 ENCOUNTER — Emergency Department (HOSPITAL_COMMUNITY): Payer: Medicaid Other

## 2018-02-10 ENCOUNTER — Emergency Department (HOSPITAL_COMMUNITY)
Admission: EM | Admit: 2018-02-10 | Discharge: 2018-02-10 | Disposition: A | Discharge status: Home / Self Care | Payer: Medicaid Other | Attending: Emergency Medicine | Admitting: Emergency Medicine

## 2018-02-10 ENCOUNTER — Encounter (HOSPITAL_COMMUNITY): Payer: Self-pay | Admitting: *Deleted

## 2018-02-10 ENCOUNTER — Other Ambulatory Visit: Payer: Self-pay

## 2018-02-10 DIAGNOSIS — Z79899 Other long term (current) drug therapy: Secondary | ICD-10-CM | POA: Insufficient documentation

## 2018-02-10 DIAGNOSIS — O9989 Other specified diseases and conditions complicating pregnancy, childbirth and the puerperium: Secondary | ICD-10-CM | POA: Diagnosis present

## 2018-02-10 DIAGNOSIS — R109 Unspecified abdominal pain: Secondary | ICD-10-CM | POA: Diagnosis not present

## 2018-02-10 DIAGNOSIS — Z3A17 17 weeks gestation of pregnancy: Secondary | ICD-10-CM | POA: Diagnosis not present

## 2018-02-10 DIAGNOSIS — Z7982 Long term (current) use of aspirin: Secondary | ICD-10-CM | POA: Insufficient documentation

## 2018-02-10 LAB — COMPREHENSIVE METABOLIC PANEL
ALBUMIN: 3 g/dL — AB (ref 3.5–5.0)
ALK PHOS: 42 U/L (ref 38–126)
ALT: 21 U/L (ref 0–44)
AST: 22 U/L (ref 15–41)
Anion gap: 9 (ref 5–15)
BILIRUBIN TOTAL: 0.4 mg/dL (ref 0.3–1.2)
BUN: 5 mg/dL — AB (ref 6–20)
CO2: 23 mmol/L (ref 22–32)
CREATININE: 0.61 mg/dL (ref 0.44–1.00)
Calcium: 8.7 mg/dL — ABNORMAL LOW (ref 8.9–10.3)
Chloride: 103 mmol/L (ref 98–111)
GFR calc Af Amer: >60 mL/min (ref >60)
Glucose, Bld: 76 mg/dL (ref 70–99)
POTASSIUM: 3.8 mmol/L (ref 3.5–5.1)
Sodium: 135 mmol/L (ref 135–145)
TOTAL PROTEIN: 6.6 g/dL (ref 6.5–8.1)

## 2018-02-10 LAB — URINALYSIS, ROUTINE W REFLEX MICROSCOPIC
Bilirubin Urine: NEGATIVE
Glucose, UA: NEGATIVE mg/dL
Hgb urine dipstick: NEGATIVE
KETONES UR: NEGATIVE mg/dL
LEUKOCYTES UA: NEGATIVE
NITRITE: NEGATIVE
PH: 7 (ref 5.0–8.0)
PROTEIN: NEGATIVE mg/dL
Specific Gravity, Urine: 1.019 (ref 1.005–1.030)

## 2018-02-10 LAB — CBC WITH DIFFERENTIAL/PLATELET
ABS IMMATURE GRANULOCYTES: 0.1 10*3/uL (ref 0.0–0.1)
Basophils Absolute: 0 10*3/uL (ref 0.0–0.1)
Basophils Relative: 0 %
EOS ABS: 0.3 10*3/uL (ref 0.0–0.7)
Eosinophils Relative: 3 %
HEMATOCRIT: 35.8 % — AB (ref 36.0–46.0)
HEMOGLOBIN: 11.6 g/dL — AB (ref 12.0–15.0)
IMMATURE GRANULOCYTES: 1 %
Lymphocytes Relative: 25 %
Lymphs Abs: 2.7 10*3/uL (ref 0.7–4.0)
MCH: 28.3 pg (ref 26.0–34.0)
MCHC: 32.4 g/dL (ref 30.0–36.0)
MCV: 87.3 fL (ref 78.0–100.0)
MONO ABS: 0.8 10*3/uL (ref 0.1–1.0)
MONOS PCT: 8 %
NEUTROS ABS: 6.9 10*3/uL (ref 1.7–7.7)
Neutrophils Relative %: 63 %
Platelets: 219 10*3/uL (ref 150–400)
RBC: 4.1 MIL/uL (ref 3.87–5.11)
RDW: 13.6 % (ref 11.5–15.5)
WBC: 10.9 10*3/uL — ABNORMAL HIGH (ref 4.0–10.5)

## 2018-02-10 MED ORDER — CYCLOBENZAPRINE HCL 10 MG PO TABS: 5.0000 mg | ORAL_TABLET | Freq: Every evening | ORAL | 0 refills | Status: DC | PRN

## 2018-02-10 MED ORDER — DIPHENHYDRAMINE HCL 25 MG PO CAPS
25.0000 mg | ORAL_CAPSULE | Freq: Once | ORAL | Status: AC
  Administered 2018-02-10: 25 mg via ORAL
  Filled 2018-02-10: qty 1

## 2018-02-10 MED ORDER — METOCLOPRAMIDE HCL 10 MG PO TABS
10.0000 mg | ORAL_TABLET | Freq: Once | ORAL | Status: AC
  Administered 2018-02-10: 10 mg via ORAL
  Filled 2018-02-10: qty 1

## 2018-02-10 NOTE — ED Notes (Signed)
Patient currently at U/S.

## 2018-02-10 NOTE — ED Triage Notes (Signed)
PT reports she is [redacted]weeks  Gestation. . Pt also reports a HA for several days and to day ABD cramping. Pt denies any vag. Bleeding  Or discharge.

## 2018-02-10 NOTE — ED Provider Notes (Signed)
[facility] EMERGENCY DEPARTMENT Provider Note   CSN: [id number] Arrival date & time: 02/10/18  1146     History   Chief Complaint Chief Complaint  Patient presents with  . Abdominal Pain  . Migraine    HPI Brittany [name] is a 37 y.o. female.  Patient with history of preeclampsia, high blood pressure presents with lower abdominal cramping and headache for the past few days.  Similar to previous.  Gradual onset no fevers or chills.  No urinary symptoms.  No vaginal bleeding or discharge.  No new sexual partners.  Patient has close outpatient follow-up with OB due to high risk pregnancy.  Patient denies neurologic symptoms.     Past Medical History:  Diagnosis Date  . Gallstone   . Headache(784.0)   . Hypertension   . Maternal chronic hypertension in first trimester 04/09/2011  . Pregnancy induced hypertension    after 2nd delivery  . Vaginal Pap smear, abnormal    cant remember    Patient Active Problem List   Diagnosis Date Noted  . Headache in pregnancy, antepartum 02/06/2018  . H/O LEEP 01/09/2018  . H/O intrauterine growth restriction in prior pregnancy, currently pregnant 01/09/2018  . H/O pre-eclampsia in prior pregnancy, currently pregnant 01/09/2018  . H/O preterm delivery, currently pregnant 01/09/2018  . UTI (urinary tract infection) during pregnancy, first trimester 12/19/2017  . Elevated ALT measurement 12/14/2017  . Vitamin D deficiency 12/14/2017  . Supervision of high risk pregnancy, antepartum 12/12/2017  . Murmur, cardiac 11/30/2016  . Anemia 11/30/2016  . Migraine 03/18/2016  . Subclinical hyperthyroidism 12/09/2015  . HTN in pregnancy, chronic 04/09/2011    Past Surgical History:  Procedure Laterality Date  . DILATION AND CURETTAGE OF UTERUS     dont remember year  . LEEP       OB History    Gravida  9   Para  3   Term  2   Preterm  1   AB  5   Living  3     SAB  4   TAB  1   Ectopic  0   Multiple    0   Live Births  3            Home Medications    Prior to Admission medications   Medication Sig Start Date End Date Taking? Authorizing Provider  aspirin EC 81 MG tablet Take 1 tablet (81 mg total) by mouth daily. Take after 12 weeks for prevention of preeclampsia later in pregnancy 01/09/18   Conan Bowens, MD  cyclobenzaprine (FLEXERIL) 5 MG tablet Take 1 tablet (5 mg total) by mouth 3 (three) times daily as needed for muscle spasms. 01/19/18   Judeth Horn, NP  docusate sodium (COLACE) 100 MG capsule Take 1 capsule (100 mg total) by mouth 2 (two) times daily. 01/06/18   Marylene Land, CNM  Doxylamine-Pyridoxine ER (BONJESTA) 20-20 MG TBCR Take 1 tablet by mouth 2 (two) times daily. 12/12/17   Orvilla Cornwall A, CNM  metoCLOPramide (REGLAN) 10 MG tablet Take 1 tablet (10 mg total) by mouth every 6 (six) hours. Patient not taking: Reported on 02/06/2018 12/07/17   Rolm Bookbinder, CNM  ondansetron (ZOFRAN ODT) 4 MG disintegrating tablet Take 1 tablet (4 mg total) by mouth every 6 (six) hours as needed for nausea. 12/22/17   Aviva Signs, CNM  Prenatal Vit-Fe Fumarate-FA (PRENATAL MULTIVITAMIN) TABS tablet Take 1 tablet by mouth daily at 12 noon.  [provider]  promethazine (PHENERGAN) 12.5 MG tablet Take 12.5 mg by mouth every 6 (six) hours as needed for nausea or vomiting.    [provider]  SUMAtriptan (IMITREX) 100 MG tablet Take 1/4 of a tablet by mouth as needed for migraine. Not to exceed 1 tablet per 24 hours. 02/06/18   Willodean Rosenthal, MD  Vitamin D, Ergocalciferol, (DRISDOL) 50000 units CAPS capsule Take 1 capsule (50,000 Units total) by mouth every 7 (seven) days. 12/14/17   Roe Coombs, CNM    Family History Family History  Problem Relation Age of Onset  . Asthma Mother   . Hypertension Mother   . Asthma Maternal Grandmother   . Hypertension Maternal Grandmother     Social History Social History   Tobacco Use   . Smoking status: Never Smoker  . Smokeless tobacco: Never Used  Substance Use Topics  . Alcohol use: No    Alcohol/week: 0.0 oz  . Drug use: No     Allergies   Patient has no known allergies.   Review of Systems Review of Systems  Constitutional: Positive for appetite change. Negative for chills and fever.  HENT: Negative for congestion.   Eyes: Negative for visual disturbance.  Respiratory: Negative for shortness of breath.   Cardiovascular: Negative for chest pain.  Gastrointestinal: Positive for abdominal pain. Negative for vomiting.  Genitourinary: Negative for dysuria and flank pain.  Musculoskeletal: Negative for back pain, neck pain and neck stiffness.  Skin: Negative for rash.  Neurological: Positive for headaches. Negative for light-headedness.     Physical Exam Updated Vital Signs BP 112/74   Pulse 72   Temp 99.1 F (37.3 C) (Oral)   Resp 16   Ht 5\' 5"  (1.651 m)   Wt 81.2 kg (179 lb)   LMP 09/25/2017   SpO2 99%   BMI 29.79 kg/m   Physical Exam  Constitutional: She is oriented to person, place, and time. She appears well-developed and well-nourished.  HENT:  Head: Normocephalic and atraumatic.  Eyes: Conjunctivae are normal. Right eye exhibits no discharge. Left eye exhibits no discharge.  Neck: Normal range of motion. Neck supple. No tracheal deviation present.  Cardiovascular: Normal rate.  Pulmonary/Chest: Effort normal.  Abdominal: Soft. She exhibits no distension. There is tenderness (mild suprapubic). There is no guarding.  Musculoskeletal: She exhibits no edema.  Neurological: She is alert and oriented to person, place, and time.  5+ strength in UE and LE with f/e at major joints. Sensation to palpation intact in UE and LE. CNs 2-12 grossly intact.  EOMFI.  PERRL.   Finger nose and coordination intact bilateral.   Visual fields intact to finger testing. No nystagmus   Skin: Skin is warm. No rash noted.  Psychiatric: She has a normal mood  and affect.  Nursing note and vitals reviewed.    ED Treatments / Results  Labs (all labs ordered are listed, but only abnormal results are displayed) Labs Reviewed  URINALYSIS, ROUTINE W REFLEX MICROSCOPIC  CBC WITH DIFFERENTIAL/PLATELET  COMPREHENSIVE METABOLIC PANEL    EKG None  Radiology No results found.  Procedures Procedures (including critical care time)  Medications Ordered in ED Medications  metoCLOPramide (REGLAN) tablet 10 mg (10 mg Oral Given 02/10/18 1455)  diphenhydrAMINE (BENADRYL) capsule 25 mg (25 mg Oral Given 02/10/18 1455)     Initial Impression / Assessment and Plan / ED Course  I have reviewed the triage vital signs and the nursing notes.  Pertinent labs & imaging results  that were available during my care of the patient were reviewed by me and considered in my medical decision making (see chart for details).    Patient with high risk pregnancy history presents with abdominal cramping and headache.  Patient has normal neurologic exam in the ER, blood pressure normal, patient has close outpatient follow-up.  Discussed migraine medications to help with her symptoms patient is tried Tylenol at home without improvement.  Patient is very concerned about her pregnancy discussed obtaining a limited pelvic/OB ultrasound for further details and continued outpatient follow-up.  Screening blood work and urinalysis due to abdominal tenderness and pregnancy.  Blood work pending, urinalysis unremarkable. Patient's care will be signed out to Dr Anitra LauthPlunkett to follow-up Ultrasound results and likely discharge for outpatient follow-up.    Final Clinical Impressions(s) / ED Diagnoses   Final diagnoses:  Pregnancy with 17 completed weeks gestation  Abdominal cramping    ED Discharge Orders    None       Blane OharaZavitz, Vastie Douty, MD 02/10/18 [phone]

## 2018-02-10 NOTE — Discharge Instructions (Addendum)
Follow up with OB early this week.

## 2018-02-10 NOTE — ED Notes (Signed)
Patient returned to room from Ultrasound. No acute distress noted.

## 2018-02-10 NOTE — ED Notes (Signed)
Dr. Plunkett at bedside at this time.  

## 2018-02-10 NOTE — ED Provider Notes (Signed)
Ultrasound showed an 18-week 3-day pregnancy with no acute complications.  Heart rate was in the 150s.  Patient has remained normotensive here.  No evidence of preeclampsia or UTI at this time.  Findings discussed with the patient and the plan is to follow-up with OB/GYN in the next 3 days.  Patient may be having spasm in the back and was given Flexeril and told to use over-the-counter patches with some lidocaine in them to help with the pain.   Gwyneth SproutPlunkett, Darlinda Bellows, MD 02/10/18 [phone]

## 2018-02-12 ENCOUNTER — Encounter (HOSPITAL_COMMUNITY): Payer: Self-pay

## 2018-02-12 ENCOUNTER — Ambulatory Visit (HOSPITAL_COMMUNITY)
Admission: RE | Admit: 2018-02-12 | Discharge: 2018-02-12 | Disposition: A | Discharge status: Home / Self Care | Payer: Medicaid Other | Source: Ambulatory Visit | Attending: Certified Nurse Midwife | Admitting: Certified Nurse Midwife

## 2018-02-12 ENCOUNTER — Other Ambulatory Visit (HOSPITAL_COMMUNITY): Payer: Self-pay | Admitting: *Deleted

## 2018-02-12 DIAGNOSIS — O0992 Supervision of high risk pregnancy, unspecified, second trimester: Secondary | ICD-10-CM | POA: Insufficient documentation

## 2018-02-12 DIAGNOSIS — O3442 Maternal care for other abnormalities of cervix, second trimester: Secondary | ICD-10-CM

## 2018-02-12 DIAGNOSIS — Z8639 Personal history of other endocrine, nutritional and metabolic disease: Secondary | ICD-10-CM | POA: Diagnosis not present

## 2018-02-12 DIAGNOSIS — Z363 Encounter for antenatal screening for malformations: Secondary | ICD-10-CM

## 2018-02-12 DIAGNOSIS — O0990 Supervision of high risk pregnancy, unspecified, unspecified trimester: Secondary | ICD-10-CM

## 2018-02-12 DIAGNOSIS — O10919 Unspecified pre-existing hypertension complicating pregnancy, unspecified trimester: Secondary | ICD-10-CM

## 2018-02-12 DIAGNOSIS — O10012 Pre-existing essential hypertension complicating pregnancy, second trimester: Secondary | ICD-10-CM

## 2018-02-12 DIAGNOSIS — Z3A18 18 weeks gestation of pregnancy: Secondary | ICD-10-CM | POA: Diagnosis not present

## 2018-02-12 DIAGNOSIS — O09292 Supervision of pregnancy with other poor reproductive or obstetric history, second trimester: Secondary | ICD-10-CM

## 2018-02-12 DIAGNOSIS — O10912 Unspecified pre-existing hypertension complicating pregnancy, second trimester: Secondary | ICD-10-CM | POA: Insufficient documentation

## 2018-02-12 DIAGNOSIS — O10911 Unspecified pre-existing hypertension complicating pregnancy, first trimester: Secondary | ICD-10-CM

## 2018-02-15 ENCOUNTER — Ambulatory Visit: Payer: Medicaid Other

## 2018-02-15 VITALS — BP 131/81 | HR 94 | Wt 179.8 lb

## 2018-02-15 DIAGNOSIS — O0990 Supervision of high risk pregnancy, unspecified, unspecified trimester: Secondary | ICD-10-CM

## 2018-02-15 MED ORDER — HYDROXYPROGESTERONE CAPROATE 275 MG/1.1ML ~~LOC~~ SOAJ
275.0000 mg | Freq: Once | SUBCUTANEOUS | Status: AC
  Administered 2018-02-15: 275 mg via SUBCUTANEOUS

## 2018-02-15 NOTE — Progress Notes (Signed)
Pt is here for 17P injection only. Injection given in L arm. Pt tolerated well but states the medicine burns.

## 2018-02-22 ENCOUNTER — Ambulatory Visit (INDEPENDENT_AMBULATORY_CARE_PROVIDER_SITE_OTHER): Payer: Medicaid Other | Admitting: *Deleted

## 2018-02-22 VITALS — BP 130/81 | HR 90 | Wt 182.0 lb

## 2018-02-22 DIAGNOSIS — O09212 Supervision of pregnancy with history of pre-term labor, second trimester: Secondary | ICD-10-CM

## 2018-02-22 DIAGNOSIS — O0990 Supervision of high risk pregnancy, unspecified, unspecified trimester: Secondary | ICD-10-CM

## 2018-02-22 MED ORDER — SERTRALINE HCL 50 MG PO TABS: 50.0000 mg | ORAL_TABLET | Freq: Every day | ORAL | 3 refills | Status: DC

## 2018-02-22 MED ORDER — HYDROXYPROGESTERONE CAPROATE 275 MG/1.1ML ~~LOC~~ SOAJ
275.0000 mg | Freq: Once | SUBCUTANEOUS | Status: AC
  Administered 2018-02-22: 275 mg via SUBCUTANEOUS

## 2018-02-22 NOTE — Progress Notes (Signed)
Patient seen due to high depression screening score. Patient admits to feeling overwhelmed. She is having daily arguments with her 35 year old and still has to care for her 691.35 year old.  Rx zoloft provided which patient agreed to Patient to see [name] next week  I have reviewed the chart and agree with nursing staff's documentation of this patient's encounter.  Catalina AntiguaPeggy Bernhardt Riemenschneider, MD 02/22/2018 11:02 AM

## 2018-02-22 NOTE — Progress Notes (Signed)
Pt is in office for 17p injection.  Pt tolerated injection well.   BP 130/81   Pulse 90   Wt 182 lb (82.6 kg)   LMP 09/25/2017   BMI 30.29 kg/m   Administrations This Visit    HYDROXYprogesterone Caproate SOAJ 275 mg    Admin Date 02/22/2018 Action Given Dose 275 mg Route Subcutaneous Administered By Lanney GinsFoster, Suzanne D, CMA         Pt states that she is having problems at home/stress.  Pt states she and her husband are having issues with 35y/o son and she also has a 35 year old that is hands on care (feedings).  Pt states she doesn't know what she should do to cope with situation.  Pt has stated to me "that I just want to get in my car and drive into a tree".  Pt made aware of services offered that may help her.  Pt advised that she may make us aware of any situation and if help is needed. [facility]  Dr Jolayne Pantheronstant asked to speak to pt today given situation and comments made.  Pt given [place] Depression Screen today- Score of 15. MD made aware.

## 2018-02-27 ENCOUNTER — Institutional Professional Consult (permissible substitution): Payer: Medicaid Other

## 2018-02-27 NOTE — BH Specialist Note (Deleted)
Integrated Behavioral Health Initial Visit  MRN: [id number] Name: Brittany [name]  Number of Integrated Behavioral Health Clinician visits:: 1/6 Session Start time: ***  Session End time: *** Total time: {IBH Total Time:21014050}  Type of Service: Integrated Behavioral Health- Individual/Family Interpretor:No. Interpretor Name and Language: n/a   Warm Hand Off Completed.       SUBJECTIVE: Brittany CamelStacy [name] is a 37 y.o. female accompanied by {CHL AMB ACCOMPANIED NW:[id number]}BY:[phone]} Patient was referred by Catalina AntiguaPeggy Constant, MDfor depression. Patient reports the following symptoms/concerns: *** Duration of problem: ***; Severity of problem: {Mild/Moderate/Severe:20260}  OBJECTIVE: Mood: {BHH MOOD:22306} and Affect: {BHH AFFECT:22307} Risk of harm to self or others: {CHL AMB BH Suicide Current Mental Status:21022748}  LIFE CONTEXT: Family and Social: *** School/Work: *** Self-Care: *** Life Changes: ***  GOALS ADDRESSED: Patient will: 1. Reduce symptoms of: {IBH Symptoms:21014056} 2. Increase knowledge and/or ability of: {IBH Patient Tools:21014057}  3. Demonstrate ability to: {IBH Goals:21014053}  INTERVENTIONS: Interventions utilized: {IBH Interventions:21014054}  Standardized Assessments completed: {IBH Screening Tools:21014051}  ASSESSMENT: Patient currently experiencing ***.   Patient may benefit from ***.  PLAN: 1. Follow up with behavioral health clinician on : *** 2. Behavioral recommendations: *** 3. Referral(s): {IBH Referrals:21014055} 4. "From scale of 1-10, how likely are you to follow plan?": ***  Rae LipsJamie C Rulon Abdalla, LCSW  Depression screen [facility] 2/9 10/19/2017 11/30/2016 12/16/2015 12/09/2015  Decreased Interest 0 0 0 0  Down, Depressed, Hopeless 0 0 0 0  PHQ - 2 Score 0 0 0 0   GAD 7 : Generalized Anxiety Score 10/26/2016  Nervous, Anxious, on Edge 0  Control/stop worrying 0  Worry too much - different things 0  Trouble relaxing 0  Restless 0  Easily  annoyed or irritable 0  Afraid - awful might happen 0  Total GAD 7 Score 0  Anxiety Difficulty Not difficult at all

## 2018-03-01 ENCOUNTER — Ambulatory Visit: Payer: Medicaid Other

## 2018-03-01 ENCOUNTER — Institutional Professional Consult (permissible substitution): Payer: Medicaid Other

## 2018-03-01 VITALS — BP 132/82 | HR 78 | Wt 180.7 lb

## 2018-03-01 DIAGNOSIS — O09899 Supervision of other high risk pregnancies, unspecified trimester: Secondary | ICD-10-CM

## 2018-03-01 DIAGNOSIS — O0990 Supervision of high risk pregnancy, unspecified, unspecified trimester: Secondary | ICD-10-CM

## 2018-03-01 DIAGNOSIS — O09219 Supervision of pregnancy with history of pre-term labor, unspecified trimester: Secondary | ICD-10-CM

## 2018-03-01 MED ORDER — HYDROXYPROGESTERONE CAPROATE 275 MG/1.1ML ~~LOC~~ SOAJ
275.0000 mg | Freq: Once | SUBCUTANEOUS | Status: AC
  Administered 2018-03-01: 275 mg via SUBCUTANEOUS

## 2018-03-01 NOTE — Progress Notes (Signed)
Presents for 17P Injection, given in left arm, tolerated well.  Administrations This Visit    HYDROXYprogesterone Caproate SOAJ 275 mg    Admin Date 03/01/2018 Action Given Dose 275 mg Route Subcutaneous Administered By Hillard Goodwine J, RMA         

## 2018-03-05 ENCOUNTER — Telehealth: Payer: Self-pay

## 2018-03-05 MED ORDER — CYCLOBENZAPRINE HCL 10 MG PO TABS: 10.0000 mg | ORAL_TABLET | Freq: Three times a day (TID) | ORAL | 2 refills | Status: DC | PRN

## 2018-03-05 NOTE — Addendum Note (Signed)
Addended by: Catalina AntiguaONSTANT, Mihir Flanigan on: 03/05/2018 04:12 PM   Modules accepted: Orders

## 2018-03-05 NOTE — Telephone Encounter (Signed)
Pt called stating she is still having severe headaches. She has taken the 1/4 Immitrex with a flexiril in the past for this (prescribed in July) and it has helped but pt states she is out of flexiril- would like to know if she can have a refill. Pt denies any blurry vision, swelling, bleeding, or abdominal pain. Pt has appointment with us in office tomorrow.

## 2018-03-06 ENCOUNTER — Encounter: Payer: Self-pay | Admitting: Obstetrics and Gynecology

## 2018-03-06 ENCOUNTER — Ambulatory Visit (INDEPENDENT_AMBULATORY_CARE_PROVIDER_SITE_OTHER): Payer: Medicaid Other | Admitting: Obstetrics and Gynecology

## 2018-03-06 ENCOUNTER — Ambulatory Visit (INDEPENDENT_AMBULATORY_CARE_PROVIDER_SITE_OTHER): Payer: Medicaid Other | Admitting: Clinical

## 2018-03-06 ENCOUNTER — Other Ambulatory Visit (HOSPITAL_COMMUNITY)
Admission: RE | Admit: 2018-03-06 | Discharge: 2018-03-06 | Disposition: A | Discharge status: Home / Self Care | Payer: Medicaid Other | Source: Ambulatory Visit | Attending: Obstetrics and Gynecology | Admitting: Obstetrics and Gynecology

## 2018-03-06 VITALS — BP 146/90 | HR 78 | Wt 180.0 lb

## 2018-03-06 DIAGNOSIS — Z23 Encounter for immunization: Secondary | ICD-10-CM | POA: Diagnosis not present

## 2018-03-06 DIAGNOSIS — F329 Major depressive disorder, single episode, unspecified: Secondary | ICD-10-CM

## 2018-03-06 DIAGNOSIS — O10919 Unspecified pre-existing hypertension complicating pregnancy, unspecified trimester: Secondary | ICD-10-CM

## 2018-03-06 DIAGNOSIS — Z3A21 21 weeks gestation of pregnancy: Secondary | ICD-10-CM | POA: Insufficient documentation

## 2018-03-06 DIAGNOSIS — O0990 Supervision of high risk pregnancy, unspecified, unspecified trimester: Secondary | ICD-10-CM

## 2018-03-06 DIAGNOSIS — F32A Depression, unspecified: Secondary | ICD-10-CM | POA: Insufficient documentation

## 2018-03-06 DIAGNOSIS — O09219 Supervision of pregnancy with history of pre-term labor, unspecified trimester: Secondary | ICD-10-CM

## 2018-03-06 DIAGNOSIS — F4322 Adjustment disorder with anxiety: Secondary | ICD-10-CM | POA: Diagnosis not present

## 2018-03-06 DIAGNOSIS — O99340 Other mental disorders complicating pregnancy, unspecified trimester: Secondary | ICD-10-CM

## 2018-03-06 DIAGNOSIS — O2341 Unspecified infection of urinary tract in pregnancy, first trimester: Secondary | ICD-10-CM

## 2018-03-06 DIAGNOSIS — O0992 Supervision of high risk pregnancy, unspecified, second trimester: Secondary | ICD-10-CM | POA: Insufficient documentation

## 2018-03-06 DIAGNOSIS — O09899 Supervision of other high risk pregnancies, unspecified trimester: Secondary | ICD-10-CM

## 2018-03-06 DIAGNOSIS — O09299 Supervision of pregnancy with other poor reproductive or obstetric history, unspecified trimester: Secondary | ICD-10-CM

## 2018-03-06 MED ORDER — HYDROXYPROGESTERONE CAPROATE 275 MG/1.1ML ~~LOC~~ SOAJ
275.0000 mg | SUBCUTANEOUS | Status: DC
  Administered 2018-03-06 – 2018-05-31 (×9): 275 mg via SUBCUTANEOUS

## 2018-03-06 NOTE — BH Specialist Note (Signed)
Integrated Behavioral Health Initial Visit  MRN: [id number] Name: Brittany [name]  Number of Integrated Behavioral Health Clinician visits:: 1/6 Session Start time: 11:26  Session End time: 12:13 Total time: 45 minutes  Type of Service: Integrated Behavioral Health- Individual/Family Interpretor:No. Interpretor Name and Language: n/a   Warm Hand Off Completed.       SUBJECTIVE: Brittany [name] is a 37 y.o. female accompanied by n/a Patient was referred by Catalina AntiguaPeggy Constant, MD for depression and stress. Patient reports the following symptoms/concerns: Pt states her primary concern today is feeling overwhelming stress and anxiety; open to learning self-coping strategies today.  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 Reduce symptoms of: anxiety and stress 2. Increase knowledge and/or ability of: self-management skills and stress reduction  3. Demonstrate ability to: Increase healthy adjustment to current life circumstances  INTERVENTIONS: Interventions utilized: Mindfulness or Management consultantelaxation Training, Psychoeducation and/or Health Education and Link to [facility] Resources  Standardized Assessments completed: Not given today  ASSESSMENT: Patient currently experiencing Adjustment disorder with anxiety.   Patient may benefit from psychoeducation and brief therapeutic  interventions regarding coping with symptoms of anxiety .  PLAN: 1. Follow up with behavioral health clinician on : One month, or earlier as needed 2. Behavioral recommendations:  -CALM relaxation breathing exercise at least twice daily (morning with breakfast; at bedtime with sleep sounds) -Consider apps as additional coping strategies when symptoms escalate -Read educational materials regarding coping with symptoms of anxiety -Take home Postpartum Planner; discuss with family to plan to help postpartum 3. Referral(s): Integrated Art gallery managerBehavioral Health Services (In Clinic) and [facility] Resources:  [facility] 4. "From scale of 1-10, how likely are you to follow plan?": 9  Rae LipsJamie C McMannes, LCSW    Depression screen [facility] 2/9 10/19/2017 11/30/2016 12/16/2015 12/09/2015  Decreased Interest 0 0 0 0  Down, Depressed, Hopeless 0 0 0 0  PHQ - 2 Score 0 0 0 0   GAD 7 : Generalized Anxiety Score 10/26/2016  Nervous, Anxious, on Edge 0  Control/stop worrying 0  Worry too much - different things 0  Trouble relaxing 0  Restless 0  Easily annoyed or irritable 0  Afraid - awful might happen 0  Total GAD 7 Score 0  Anxiety Difficulty Not difficult at all

## 2018-03-06 NOTE — Progress Notes (Signed)
Pt states some ctx recently.  Some change in D/C- would like check today.  Pt states some HA's.

## 2018-03-06 NOTE — Progress Notes (Signed)
Subjective:  Brittany [name] is a 37 y.o. [id number] at 5051w1d being seen today for ongoing prenatal care.  She is currently monitored for the following issues for this high-risk pregnancy and has HTN in pregnancy, chronic; Subclinical hyperthyroidism; Migraine; Murmur, cardiac; Anemia; Supervision of high risk pregnancy, antepartum; Vitamin D deficiency; UTI (urinary tract infection) during pregnancy, first trimester; H/O LEEP; H/O intrauterine growth restriction in prior pregnancy, currently pregnant; H/O pre-eclampsia in prior pregnancy, currently pregnant; H/O preterm delivery, currently pregnant; Headache in pregnancy, antepartum; and Depression affecting pregnancy on their problem list.  Patient reports vaginal discharge and cramps.  Contractions: Irritability. Vag. Bleeding: None.  Movement: Present. Denies leaking of fluid.   The following portions of the patient's history were reviewed and updated as appropriate: allergies, current medications, past family history, past medical history, past social history, past surgical history and problem list. Problem list updated.  Objective:   Vitals:   03/06/18 1035  BP: (!) 146/90  Pulse: 78  Weight: 180 lb (81.6 kg)    Fetal Status:     Movement: Present     General:  Alert, oriented and cooperative. Patient is in no acute distress.  Skin: Skin is warm and dry. No rash noted.   Cardiovascular: Normal heart rate noted  Respiratory: Normal respiratory effort, no problems with respiration noted  Abdomen: Soft, gravid, appropriate for gestational age. Pain/Pressure: Present     Pelvic:  Cervical exam performed        Extremities: Normal range of motion.     Mental Status: Normal mood and affect. Normal behavior. Normal judgment and thought content.   Urinalysis:      Assessment and Plan:  Pregnancy: [id number] at 4651w1d  1. Supervision of high risk pregnancy, antepartum Stable - HYDROXYprogesterone Caproate SOAJ 275 mg - Flu Vaccine QUAD 36+  mos IM (Fluarix, Quad PF) - Cervicovaginal ancillary only  2. HTN in pregnancy, chronic Stable  3. H/O preterm delivery, currently pregnant Continue with 17 OHP  4. H/O pre-eclampsia in prior pregnancy, currently pregnant No S/Sx   5. H/O intrauterine growth restriction in prior pregnancy, currently pregnant Follow with growth scans as indicated  6. UTI (urinary tract infection) during pregnancy, first trimester  - Culture, OB Urine  7. Depression affecting pregnancy Continue with Zoloft To see [name] today  Preterm labor symptoms and general obstetric precautions including but not limited to vaginal bleeding, contractions, leaking of fluid and fetal movement were reviewed in detail with the patient. Please refer to After Visit Summary for other counseling recommendations.  Return in about 4 weeks (around 04/03/2018) for OB visit.   Hermina StaggersErvin, [name], MD

## 2018-03-08 ENCOUNTER — Encounter: Payer: Medicaid Other | Admitting: Obstetrics and Gynecology

## 2018-03-08 LAB — CERVICOVAGINAL ANCILLARY ONLY
BACTERIAL VAGINITIS: POSITIVE — AB
CHLAMYDIA, DNA PROBE: NEGATIVE
Candida vaginitis: NEGATIVE
NEISSERIA GONORRHEA: NEGATIVE
TRICH (WINDOWPATH): NEGATIVE

## 2018-03-09 ENCOUNTER — Other Ambulatory Visit: Payer: Self-pay

## 2018-03-09 ENCOUNTER — Encounter (HOSPITAL_COMMUNITY): Payer: Self-pay | Admitting: *Deleted

## 2018-03-09 ENCOUNTER — Inpatient Hospital Stay (HOSPITAL_BASED_OUTPATIENT_CLINIC_OR_DEPARTMENT_OTHER): Payer: Medicaid Other

## 2018-03-09 ENCOUNTER — Inpatient Hospital Stay (HOSPITAL_COMMUNITY)
Admission: AD | Admit: 2018-03-09 | Discharge: 2018-03-09 | Disposition: A | Discharge status: Home / Self Care | Payer: Medicaid Other | Source: Ambulatory Visit | Attending: Obstetrics and Gynecology | Admitting: Obstetrics and Gynecology

## 2018-03-09 DIAGNOSIS — N760 Acute vaginitis: Secondary | ICD-10-CM | POA: Diagnosis present

## 2018-03-09 DIAGNOSIS — O479 False labor, unspecified: Secondary | ICD-10-CM | POA: Diagnosis present

## 2018-03-09 DIAGNOSIS — O99282 Endocrine, nutritional and metabolic diseases complicating pregnancy, second trimester: Secondary | ICD-10-CM | POA: Diagnosis not present

## 2018-03-09 DIAGNOSIS — O4702 False labor before 37 completed weeks of gestation, second trimester: Secondary | ICD-10-CM

## 2018-03-09 DIAGNOSIS — Z3A21 21 weeks gestation of pregnancy: Secondary | ICD-10-CM | POA: Diagnosis not present

## 2018-03-09 DIAGNOSIS — E0590 Thyrotoxicosis, unspecified without thyrotoxic crisis or storm: Secondary | ICD-10-CM

## 2018-03-09 DIAGNOSIS — O26892 Other specified pregnancy related conditions, second trimester: Secondary | ICD-10-CM | POA: Diagnosis present

## 2018-03-09 DIAGNOSIS — O10012 Pre-existing essential hypertension complicating pregnancy, second trimester: Secondary | ICD-10-CM | POA: Diagnosis not present

## 2018-03-09 DIAGNOSIS — R109 Unspecified abdominal pain: Secondary | ICD-10-CM | POA: Diagnosis present

## 2018-03-09 DIAGNOSIS — O4700 False labor before 37 completed weeks of gestation, unspecified trimester: Secondary | ICD-10-CM | POA: Diagnosis present

## 2018-03-09 DIAGNOSIS — O09293 Supervision of pregnancy with other poor reproductive or obstetric history, third trimester: Secondary | ICD-10-CM | POA: Diagnosis not present

## 2018-03-09 DIAGNOSIS — O3440 Maternal care for other abnormalities of cervix, unspecified trimester: Secondary | ICD-10-CM | POA: Diagnosis not present

## 2018-03-09 DIAGNOSIS — B9689 Other specified bacterial agents as the cause of diseases classified elsewhere: Secondary | ICD-10-CM | POA: Diagnosis present

## 2018-03-09 DIAGNOSIS — Z363 Encounter for antenatal screening for malformations: Secondary | ICD-10-CM

## 2018-03-09 LAB — URINALYSIS, ROUTINE W REFLEX MICROSCOPIC
Bilirubin Urine: NEGATIVE
Glucose, UA: NEGATIVE mg/dL
Hgb urine dipstick: NEGATIVE
KETONES UR: NEGATIVE mg/dL
Leukocytes, UA: NEGATIVE
NITRITE: NEGATIVE
PH: 7 (ref 5.0–8.0)
PROTEIN: NEGATIVE mg/dL
Specific Gravity, Urine: 1.02 (ref 1.005–1.030)

## 2018-03-09 LAB — CULTURE, OB URINE

## 2018-03-09 LAB — URINE CULTURE, OB REFLEX

## 2018-03-09 MED ORDER — METRONIDAZOLE 500 MG PO TABS: 500.0000 mg | ORAL_TABLET | Freq: Two times a day (BID) | ORAL | 0 refills | Status: DC

## 2018-03-09 MED ORDER — ACETAMINOPHEN 500 MG PO TABS
1000.0000 mg | ORAL_TABLET | Freq: Once | ORAL | Status: AC
  Administered 2018-03-09: 1000 mg via ORAL
  Filled 2018-03-09: qty 2

## 2018-03-09 MED ORDER — LACTATED RINGERS IV BOLUS
1000.0000 mL | Freq: Once | INTRAVENOUS | Status: AC
Start: 2018-03-09 — End: 2018-03-09
  Administered 2018-03-09: 1000 mL via INTRAVENOUS

## 2018-03-09 NOTE — MAU Note (Signed)
Pt C/O lower abdominal cramping since last night, feels like she needs to have a BM but she doesn't.  Denies bleeding or LOF.  Reports good fetal movement.

## 2018-03-09 NOTE — MAU Provider Note (Signed)
History     CSN: [id number]  Arrival date and time: 03/09/18 4098   First Provider Initiated Contact with Patient 03/09/18 [phone]      Chief Complaint  Patient presents with  . Abdominal Pain   HPI  Ms.  Brittany [name] is a 37 y.o. year old [phone] female at [redacted]w[redacted]d weeks gestation who presents to MAU reporting abdominal pain/cramping since last night. She was told today by her OB office that she has BV and a Rx was sent to her pharmacy. When she told them she was on the way here, they told her MAU would evaluate her for the cramping. She reports good (+) FM today.   Past Medical History:  Diagnosis Date  . Gallstone   . Headache(784.0)   . Hypertension   . Maternal chronic hypertension in first trimester 04/09/2011  . Pregnancy induced hypertension    after 2nd delivery  . Vaginal Pap smear, abnormal    cant remember    Past Surgical History:  Procedure Laterality Date  . DILATION AND CURETTAGE OF UTERUS     dont remember year  . LEEP      Family History  Problem Relation Age of Onset  . Asthma Mother   . Hypertension Mother   . Asthma Maternal Grandmother   . Hypertension Maternal Grandmother     Social History   Tobacco Use  . Smoking status: Never Smoker  . Smokeless tobacco: Never Used  Substance Use Topics  . Alcohol use: No    Alcohol/week: 0.0 standard drinks  . Drug use: No    Allergies: No Known Allergies  Facility-Administered Medications Prior to Admission  Medication Dose Route Frequency Provider Last Rate Last Dose  . HYDROXYprogesterone Caproate SOAJ 275 mg  275 mg Subcutaneous Weekly Hermina Staggers, MD   275 mg at 03/06/18 1053   Medications Prior to Admission  Medication Sig Dispense Refill Last Dose  . aspirin EC 81 MG tablet Take 1 tablet (81 mg total) by mouth daily. Take after 12 weeks for prevention of preeclampsia later in pregnancy 300 tablet 2 Taking  . cyclobenzaprine (FLEXERIL) 10 MG tablet Take 0.5-1 tablets (5-10 mg total)  by mouth at bedtime as needed for muscle spasms. 10 tablet 0 Taking  . cyclobenzaprine (FLEXERIL) 10 MG tablet Take 1 tablet (10 mg total) by mouth 3 (three) times daily as needed for muscle spasms. 30 tablet 2   . docusate sodium (COLACE) 100 MG capsule Take 1 capsule (100 mg total) by mouth 2 (two) times daily. (Patient not taking: Reported on 02/12/2018) 10 capsule 0 Not Taking  . Doxylamine-Pyridoxine ER (BONJESTA) 20-20 MG TBCR Take 1 tablet by mouth 2 (two) times daily. 60 tablet 6 Taking  . metoCLOPramide (REGLAN) 10 MG tablet Take 1 tablet (10 mg total) by mouth every 6 (six) hours. (Patient not taking: Reported on 02/06/2018) 30 tablet 0 Not Taking  . metroNIDAZOLE (FLAGYL) 500 MG tablet Take 1 tablet (500 mg total) by mouth 2 (two) times daily. 14 tablet 0   . ondansetron (ZOFRAN ODT) 4 MG disintegrating tablet Take 1 tablet (4 mg total) by mouth every 6 (six) hours as needed for nausea. (Patient not taking: Reported on 02/12/2018) 20 tablet 0 Not Taking  . Prenatal Vit-Fe Fumarate-FA (PRENATAL MULTIVITAMIN) TABS tablet Take 1 tablet by mouth daily at 12 noon.   Taking  . promethazine (PHENERGAN) 12.5 MG tablet Take 12.5 mg by mouth every 6 (six) hours as needed for nausea or vomiting.  Not Taking  . sertraline (ZOLOFT) 50 MG tablet Take 1 tablet (50 mg total) by mouth daily. 30 tablet 3   . SUMAtriptan (IMITREX) 100 MG tablet Take 1/4 of a tablet by mouth as needed for migraine. Not to exceed 1 tablet per 24 hours. (Patient not taking: Reported on 02/12/2018) 9 tablet 1 Not Taking  . Vitamin D, Ergocalciferol, (DRISDOL) 50000 units CAPS capsule Take 1 capsule (50,000 Units total) by mouth every 7 (seven) days. 30 capsule 2 Taking    Review of Systems  Constitutional: Negative.   HENT: Negative.   Eyes: Negative.   Respiratory: Negative.   Cardiovascular: Negative.   Gastrointestinal: Positive for abdominal pain (cramping since last night).  Endocrine: Negative.   Genitourinary: Positive  for pelvic pain.  Musculoskeletal: Negative.   Skin: Negative.   Allergic/Immunologic: Negative.   Neurological: Negative.   Hematological: Negative.   Psychiatric/Behavioral: Negative.    Physical Exam   Blood pressure 112/70, pulse 85, temperature 98.6 F (37 C), temperature source Oral, resp. rate 18, height 5\' 5"  (1.651 m), weight 82.6 kg, last menstrual period 09/25/2017.  Physical Exam  Nursing note and vitals reviewed. Constitutional: She is oriented to person, place, and time. She appears well-developed and well-nourished.  HENT:  Head: Normocephalic and atraumatic.  Eyes: Pupils are equal, round, and reactive to light.  Neck: Normal range of motion.  Cardiovascular: Normal rate, regular rhythm and normal heart sounds.  Respiratory: Effort normal and breath sounds normal.  GI: Soft. Bowel sounds are normal.  Genitourinary:  Genitourinary Comments: Dilation: Closed Effacement (%): Thick Cervical Position: Posterior Station: Ballotable Presentation: Undeterminable Exam by: Carloyn Jaeger, MSN, CNM   Musculoskeletal: Normal range of motion.  Neurological: She is alert and oriented to person, place, and time.  Skin: Skin is warm and dry.  Psychiatric: She has a normal mood and affect. Her behavior is normal. Judgment and thought content normal.   Cervical re-exam: unchanged  MAU Course  Procedures  MDM Tylenol 1000 mg LR 1000 ml @ bolus rate Continuous Toco: irregular every 4-7 mins OB Limited U/S for CL  *Consult with Dr. Vergie Living @ 1630 - notified of patient's complaints, assessments, lab & U/S results, tx plan d/c home with closer F/U at [facility] - ok to d/c home, agrees with plan  Results for orders placed or performed during the hospital encounter of 03/09/18 (from the past 24 hour(s))  Urinalysis, Routine w reflex microscopic     Status: Abnormal   Collection Time: 03/09/18  9:44 AM  Result Value Ref Range   Color, Urine YELLOW YELLOW   APPearance CLOUDY (A)  CLEAR   Specific Gravity, Urine 1.020 1.005 - 1.030   pH 7.0 5.0 - 8.0   Glucose, UA NEGATIVE NEGATIVE mg/dL   Hgb urine dipstick NEGATIVE NEGATIVE   Bilirubin Urine NEGATIVE NEGATIVE   Ketones, ur NEGATIVE NEGATIVE mg/dL   Protein, ur NEGATIVE NEGATIVE mg/dL   Nitrite NEGATIVE NEGATIVE   Leukocytes, UA NEGATIVE NEGATIVE    [place] Mfm Ob Limited  Result Date: 03/09/2018 ----------------------------------------------------------------------  OBSTETRICS REPORT                       (Signed Final 03/09/2018 03:25 pm) ---------------------------------------------------------------------- Patient Info  ID #:       [id number]                          D.O.B.:  [birth year] (37 yrs)  Name:  Brittany [name]                 Visit Date: 03/09/2018 02:33 pm ---------------------------------------------------------------------- Performed By  Performed By:     Lenise ArenaHannah Bazemore        [address]     [address]                    [place]                                                             [address]  Attending:        Patsi Searsobert L Jacobson      [facility]                    MD  Referred By:      Conan BowensKELLY M DAVIS                    MD ---------------------------------------------------------------------- Orders   #  Description                          Code         Ordered By   1  US MFM OB LIMITED                    [id number]     Keshara Kiger  ----------------------------------------------------------------------   #  Order #                    Accession #                 Episode #   1  [id number]                  [id number]  ---------------------------------------------------------------------- Indications   Encounter for antenatal screening for          Z36.3   malformations   Hypertension - Chronic/Pre-existing            O10.019   Hyperthyroid                                   O99.280 E05.90   [redacted] weeks gestation of pregnancy                Z3A.21   Poor obstetric  history: Previous               O09.299   preeclampsia / eclampsia/gestational HTN   Poor obstetric history: Previous fetal growth  O09.299   restriction (FGR)   Previous cervical surgery (LEEP)               O34.40  ---------------------------------------------------------------------- Vital Signs                                                 Height:  5'5" ---------------------------------------------------------------------- Fetal Evaluation  Num Of Fetuses:         1  Fetal Heart Rate(bpm):  164  Cardiac Activity:       Observed  Presentation:           Breech  Placenta:               Posterior  Amniotic Fluid  AFI FV:      Within normal limits                              Largest Pocket(cm)                              4.89 ---------------------------------------------------------------------- OB History  Gravidity:    9         Term:   2        Prem:   1        SAB:   4  TOP:          1       Ectopic:  0        Living: 3 ---------------------------------------------------------------------- Gestational Age  LMP:           23w 4d        Date:  09/25/17                 EDD:   07/02/18  Best:          [place] 4d     Det. ByMarcella Dubs         EDD:   07/16/18                                      (11/21/17) ---------------------------------------------------------------------- Cervix Uterus Adnexa  Cervix  Length:           3.56  cm.  Normal appearance by transabdominal scan. LUS Contraction  Uterus  No abnormality visualized. ---------------------------------------------------------------------- Comments  U/S images were reviewed.  Cervix appears closed and  intact.  It measures 3.56 cm in length.  No fetal abnormalities  were identified.  Recommendations: 1) Follow-up as previously scheduled ---------------------------------------------------------------------- Recommendations  Follow-up growth in 4-5 weeks ----------------------------------------------------------------------               Patsi Sears, MD Electronically Signed Final Report   03/09/2018 03:25 pm ----------------------------------------------------------------------   Assessment and Plan  Preterm contractions  - Advised to return for UC's that are every 3-5 mins, not relieved by Tylenol 1000 mg or soaking in warm tub - Msg sent to [facility] - Admin Pool to schedule pt with provider sooner than 9/19 OB appt that is scheduled - Discharge home - Patient verbalized an understanding of the plan of care and agrees.   Raelyn Mora, MSN, CNM 03/09/2018, 9:57 AM

## 2018-03-09 NOTE — Progress Notes (Signed)
Called pt to let her know results and notify her of rx flagyl sent to pharmacy. [facility] "bad [facility]" that are painful this morning.

## 2018-03-09 NOTE — Progress Notes (Signed)
Patient states she was seen in the office for a routine appointment a few days ago and [facility].

## 2018-03-15 ENCOUNTER — Ambulatory Visit (INDEPENDENT_AMBULATORY_CARE_PROVIDER_SITE_OTHER): Payer: Medicaid Other | Admitting: Obstetrics & Gynecology

## 2018-03-15 ENCOUNTER — Encounter: Payer: Self-pay | Admitting: Obstetrics and Gynecology

## 2018-03-15 ENCOUNTER — Ambulatory Visit: Payer: Medicaid Other

## 2018-03-15 ENCOUNTER — Encounter: Payer: Self-pay | Admitting: Obstetrics & Gynecology

## 2018-03-15 VITALS — BP 127/82 | HR 84 | Wt 178.8 lb

## 2018-03-15 DIAGNOSIS — O09219 Supervision of pregnancy with history of pre-term labor, unspecified trimester: Secondary | ICD-10-CM

## 2018-03-15 DIAGNOSIS — O09299 Supervision of pregnancy with other poor reproductive or obstetric history, unspecified trimester: Secondary | ICD-10-CM

## 2018-03-15 DIAGNOSIS — O0990 Supervision of high risk pregnancy, unspecified, unspecified trimester: Secondary | ICD-10-CM

## 2018-03-15 DIAGNOSIS — O09899 Supervision of other high risk pregnancies, unspecified trimester: Secondary | ICD-10-CM

## 2018-03-15 NOTE — Progress Notes (Signed)
   PRENATAL VISIT NOTE  Subjective:  Brittany [name] is a 37 y.o. [phone] at 6159w3d being seen today for ongoing prenatal care.  She is currently monitored for the following issues for this high-risk pregnancy and has HTN in pregnancy, chronic; Subclinical hyperthyroidism; Migraine; Murmur, cardiac; Anemia; Supervision of high risk pregnancy, antepartum; Vitamin D deficiency; UTI (urinary tract infection) during pregnancy, first trimester; H/O LEEP; H/O intrauterine growth restriction in prior pregnancy, currently pregnant; H/O pre-eclampsia in prior pregnancy, currently pregnant; H/O preterm delivery, currently pregnant; Headache in pregnancy, antepartum; Depression affecting pregnancy; Preterm contractions; and Bacterial vaginitis on their problem list.  Patient reports occasional contractions.  Contractions: Irritability. Vag. Bleeding: None.  Movement: Present. Denies leaking of fluid.   The following portions of the patient's history were reviewed and updated as appropriate: allergies, current medications, past family history, past medical history, past social history, past surgical history and problem list. Problem list updated.  Objective:   Vitals:   03/15/18 1423  BP: 127/82  Pulse: 84  Weight: 178 lb 12.8 oz (81.1 kg)    Fetal Status: Fetal Heart Rate (bpm): 153   Movement: Present     General:  Alert, oriented and cooperative. Patient is in no acute distress.  Skin: Skin is warm and dry. No rash noted.   Cardiovascular: Normal heart rate noted  Respiratory: Normal respiratory effort, no problems with respiration noted  Abdomen: Soft, gravid, appropriate for gestational age.  Pain/Pressure: Absent     Pelvic: Cervical exam deferred        Extremities: Normal range of motion.  Edema: None  Mental Status: Normal mood and affect. Normal behavior. Normal judgment and thought content.   Assessment and Plan:  Pregnancy: [phone] at 259w3d  1. Supervision of high risk pregnancy,  antepartum US 8/23 nl cx length  2. H/O intrauterine growth restriction in prior pregnancy, currently pregnant 17 P  3. H/O preterm delivery, currently pregnant [place] f/u 4 weeks  Preterm labor symptoms and general obstetric precautions including but not limited to vaginal bleeding, contractions, leaking of fluid and fetal movement were reviewed in detail with the patient. Please refer to After Visit Summary for other counseling recommendations.  Return in about 4 weeks (around 04/12/2018).  Future Appointments  Date Time Provider Department Center  03/22/2018  9:30 AM [facility] NURSE [facility] None  03/29/2018  9:30 AM [facility] NURSE [facility] None  04/05/2018 10:00 AM Hermina StaggersErvin, Michael L, MD [facility] None  04/10/2018  9:00 AM [facility] US 3 [facility]  10/08/2018 10:00 AM Evaristo BuryShambley, Ashleigh N, NP [facility]    Scheryl DarterJames Weltha Cathy, MD

## 2018-03-15 NOTE — Patient Instructions (Signed)
Second Trimester of Pregnancy The second trimester is from week 13 through week 28, month 4 through 6. This is often the time in pregnancy that you feel your best. Often times, morning sickness has lessened or quit. You may have more energy, and you may get hungry more often. Your unborn baby (fetus) is growing rapidly. At the end of the sixth month, he or she is about 9 inches long and weighs about 1 pounds. You will likely feel the baby move (quickening) between 18 and 20 weeks of pregnancy. Follow these instructions at home:  Avoid all smoking, herbs, and alcohol. Avoid drugs not approved by your doctor.  Do not use any tobacco products, including cigarettes, chewing tobacco, and electronic cigarettes. If you need help quitting, ask your doctor. You may get counseling or other support to help you quit.  Only take medicine as told by your doctor. Some medicines are safe and some are not during pregnancy.  Exercise only as told by your doctor. Stop exercising if you start having cramps.  Eat regular, healthy meals.  Wear a good support bra if your breasts are tender.  Do not use hot tubs, steam rooms, or saunas.  Wear your seat belt when driving.  Avoid raw meat, uncooked cheese, and liter boxes and soil used by cats.  Take your prenatal vitamins.  Take 1500-2000 milligrams of calcium daily starting at the 20th week of pregnancy until you deliver your baby.  Try taking medicine that helps you poop (stool softener) as needed, and if your doctor approves. Eat more fiber by eating fresh fruit, vegetables, and whole grains. Drink enough fluids to keep your pee (urine) clear or pale yellow.  Take warm water baths (sitz baths) to soothe pain or discomfort caused by hemorrhoids. Use hemorrhoid cream if your doctor approves.  If you have puffy, bulging veins (varicose veins), wear support hose. Raise (elevate) your feet for 15 minutes, 3-4 times a day. Limit salt in your diet.  Avoid heavy  lifting, wear low heals, and sit up straight.  Rest with your legs raised if you have leg cramps or low back pain.  Visit your dentist if you have not gone during your pregnancy. Use a soft toothbrush to brush your teeth. Be gentle when you floss.  You can have sex (intercourse) unless your doctor tells you not to.  Go to your doctor visits. Get help if:  You feel dizzy.  You have mild cramps or pressure in your lower belly (abdomen).  You have a nagging pain in your belly area.  You continue to feel sick to your stomach (nauseous), throw up (vomit), or have watery poop (diarrhea).  You have bad smelling fluid coming from your vagina.  You have pain with peeing (urination). Get help right away if:  You have a fever.  You are leaking fluid from your vagina.  You have spotting or bleeding from your vagina.  You have severe belly cramping or pain.  You lose or gain weight rapidly.  You have trouble catching your breath and have chest pain.  You notice sudden or extreme puffiness (swelling) of your face, hands, ankles, feet, or legs.  You have not felt the baby move in over an hour.  You have severe headaches that do not go away with medicine.  You have vision changes. This information is not intended to replace advice given to you by your health care provider. Make sure you discuss any questions you have with your health care   provider. Document Released: 09/28/2009 Document Revised: 12/10/2015 Document Reviewed: 09/04/2012 Elsevier Interactive Patient Education  2017 [employer] Inc.  

## 2018-03-15 NOTE — Progress Notes (Signed)
ROB. 17P given in left arm, tolerated well.  Administrations This Visit    HYDROXYprogesterone Caproate SOAJ 275 mg    Admin Date 03/15/2018 Action Given Dose 275 mg Route Subcutaneous Administered By Maretta BeesMcGlashan, Stat[place] Markey J, RMA

## 2018-03-22 ENCOUNTER — Ambulatory Visit (INDEPENDENT_AMBULATORY_CARE_PROVIDER_SITE_OTHER): Payer: Medicaid Other | Admitting: *Deleted

## 2018-03-22 VITALS — BP 122/79 | HR 92 | Wt 182.0 lb

## 2018-03-22 DIAGNOSIS — O09213 Supervision of pregnancy with history of pre-term labor, third trimester: Secondary | ICD-10-CM | POA: Diagnosis not present

## 2018-03-22 DIAGNOSIS — O0990 Supervision of high risk pregnancy, unspecified, unspecified trimester: Secondary | ICD-10-CM

## 2018-03-22 NOTE — Progress Notes (Signed)
Pt is in office for 17p injections. Pt states she is doing well, has no other concerns today. Injection given, pt tolerated well.  BP 122/79   Pulse 92   Wt 182 lb (82.6 kg)   LMP 09/25/2017   BMI 30.29 kg/m   Administrations This Visit    HYDROXYprogesterone Caproate SOAJ 275 mg    Admin Date 03/22/2018 Action Given Dose 275 mg Route Subcutaneous Administered By Lanney Gins, CMA

## 2018-03-26 ENCOUNTER — Encounter (HOSPITAL_COMMUNITY): Payer: Self-pay

## 2018-03-26 ENCOUNTER — Inpatient Hospital Stay (HOSPITAL_COMMUNITY)
Admission: AD | Admit: 2018-03-26 | Discharge: 2018-03-26 | Disposition: A | Discharge status: Home / Self Care | Payer: Medicaid Other | Source: Ambulatory Visit | Attending: Obstetrics & Gynecology | Admitting: Obstetrics & Gynecology

## 2018-03-26 DIAGNOSIS — Z8249 Family history of ischemic heart disease and other diseases of the circulatory system: Secondary | ICD-10-CM | POA: Insufficient documentation

## 2018-03-26 DIAGNOSIS — O09522 Supervision of elderly multigravida, second trimester: Secondary | ICD-10-CM | POA: Insufficient documentation

## 2018-03-26 DIAGNOSIS — O26892 Other specified pregnancy related conditions, second trimester: Secondary | ICD-10-CM | POA: Insufficient documentation

## 2018-03-26 DIAGNOSIS — Z7982 Long term (current) use of aspirin: Secondary | ICD-10-CM | POA: Insufficient documentation

## 2018-03-26 DIAGNOSIS — Z3A24 24 weeks gestation of pregnancy: Secondary | ICD-10-CM | POA: Diagnosis not present

## 2018-03-26 DIAGNOSIS — O09212 Supervision of pregnancy with history of pre-term labor, second trimester: Secondary | ICD-10-CM | POA: Insufficient documentation

## 2018-03-26 DIAGNOSIS — R110 Nausea: Secondary | ICD-10-CM | POA: Diagnosis not present

## 2018-03-26 DIAGNOSIS — R109 Unspecified abdominal pain: Secondary | ICD-10-CM | POA: Diagnosis present

## 2018-03-26 DIAGNOSIS — Z825 Family history of asthma and other chronic lower respiratory diseases: Secondary | ICD-10-CM | POA: Diagnosis not present

## 2018-03-26 DIAGNOSIS — R1030 Lower abdominal pain, unspecified: Secondary | ICD-10-CM | POA: Insufficient documentation

## 2018-03-26 DIAGNOSIS — Z8751 Personal history of pre-term labor: Secondary | ICD-10-CM

## 2018-03-26 DIAGNOSIS — O26899 Other specified pregnancy related conditions, unspecified trimester: Secondary | ICD-10-CM

## 2018-03-26 DIAGNOSIS — Z3492 Encounter for supervision of normal pregnancy, unspecified, second trimester: Secondary | ICD-10-CM

## 2018-03-26 LAB — URINALYSIS, ROUTINE W REFLEX MICROSCOPIC
BILIRUBIN URINE: NEGATIVE
Glucose, UA: NEGATIVE mg/dL
HGB URINE DIPSTICK: NEGATIVE
Ketones, ur: NEGATIVE mg/dL
Leukocytes, UA: NEGATIVE
Nitrite: NEGATIVE
PH: 8 (ref 5.0–8.0)
Protein, ur: NEGATIVE mg/dL
SPECIFIC GRAVITY, URINE: 1.015 (ref 1.005–1.030)

## 2018-03-26 NOTE — MAU Note (Signed)
Pt presents to MAU with lower abdominal cramping  And nausea that started today around 12pm. Pt has not felt her baby move normally following the cramping. Pt denies VB and LOF.

## 2018-03-26 NOTE — MAU Provider Note (Addendum)
History     CSN: [id number]  Arrival date and time: 03/26/18 1559   None     Chief Complaint  Patient presents with  . Abdominal Pain  . Decreased Fetal Movement  . Nausea   HPI Brittany [name] is 37 y.o. [phone] [redacted]w[redacted]d weeks presenting with lower abdominal cramping and nausea that began at noon today.  Reports no feeling fetal movement since that time  Neg for vaginal bleeding and loss of fluid.  Denies fever, constipation, and diarrhea.  Hx of chronic hypertension, preterm delivery, She is a patient at [facility], has next appt 9/19.  Treated at last visit for BV, sxs resolved.  Past Medical History:  Diagnosis Date  . Gallstone   . Headache(784.0)   . Hypertension   . Maternal chronic hypertension in first trimester 04/09/2011  . Pregnancy induced hypertension    after 2nd delivery  . Vaginal Pap smear, abnormal    cant remember    Past Surgical History:  Procedure Laterality Date  . DILATION AND CURETTAGE OF UTERUS     dont remember year  . LEEP      Family History  Problem Relation Age of Onset  . Asthma Mother   . Hypertension Mother   . Asthma Maternal Grandmother   . Hypertension Maternal Grandmother     Social History   Tobacco Use  . Smoking status: Never Smoker  . Smokeless tobacco: Never Used  Substance Use Topics  . Alcohol use: No    Alcohol/week: 0.0 standard drinks  . Drug use: No    Allergies: No Known Allergies  Facility-Administered Medications Prior to Admission  Medication Dose Route Frequency Provider Last Rate Last Dose  . HYDROXYprogesterone Caproate SOAJ 275 mg  275 mg Subcutaneous Weekly Hermina Staggers, MD   275 mg at 03/22/18 0930   Medications Prior to Admission  Medication Sig Dispense Refill Last Dose  . aspirin EC 81 MG tablet Take 1 tablet (81 mg total) by mouth daily. Take after 12 weeks for prevention of preeclampsia later in pregnancy 300 tablet 2 03/09/2018 at Unknown time  . cyclobenzaprine (FLEXERIL) 10 MG tablet Take  0.5-1 tablets (5-10 mg total) by mouth at bedtime as needed for muscle spasms. 10 tablet 0 03/08/2018 at Unknown time  . docusate sodium (COLACE) 100 MG capsule Take 1 capsule (100 mg total) by mouth 2 (two) times daily. (Patient not taking: Reported on 02/12/2018) 10 capsule 0 Not Taking  . Doxylamine-Pyridoxine ER (BONJESTA) 20-20 MG TBCR Take 1 tablet by mouth 2 (two) times daily. (Patient not taking: Reported on 03/09/2018) 60 tablet 6 Not Taking at Unknown time  . metoCLOPramide (REGLAN) 10 MG tablet Take 1 tablet (10 mg total) by mouth every 6 (six) hours. (Patient not taking: Reported on 02/06/2018) 30 tablet 0 Not Taking  . metroNIDAZOLE (FLAGYL) 500 MG tablet Take 1 tablet (500 mg total) by mouth 2 (two) times daily. (Patient not taking: Reported on 03/09/2018) 14 tablet 0 Not Taking at Unknown time  . ondansetron (ZOFRAN ODT) 4 MG disintegrating tablet Take 1 tablet (4 mg total) by mouth every 6 (six) hours as needed for nausea. (Patient not taking: Reported on 02/12/2018) 20 tablet 0 Not Taking  . Prenatal Vit-Fe Fumarate-FA (PRENATAL MULTIVITAMIN) TABS tablet Take 1 tablet by mouth daily at 12 noon.   03/09/2018 at Unknown time  . sertraline (ZOLOFT) 50 MG tablet Take 1 tablet (50 mg total) by mouth daily. (Patient not taking: Reported on 03/09/2018) 30 tablet 3  Not Taking at Unknown time  . SUMAtriptan (IMITREX) 100 MG tablet Take 1/4 of a tablet by mouth as needed for migraine. Not to exceed 1 tablet per 24 hours. (Patient not taking: Reported on 02/12/2018) 9 tablet 1 Not Taking  . Vitamin D, Ergocalciferol, (DRISDOL) 50000 units CAPS capsule Take 1 capsule (50,000 Units total) by mouth every 7 (seven) days. (Patient not taking: Reported on 03/09/2018) 30 capsule 2 Not Taking at Unknown time    Review of Systems  Constitutional: Negative for activity change, appetite change and fever.  Respiratory: Negative for chest tightness.   Cardiovascular: Negative for chest pain.  Gastrointestinal:  Positive for abdominal pain (cramping) and nausea. Negative for constipation, diarrhea and vomiting.  Genitourinary: Negative for vaginal bleeding and vaginal discharge.  Musculoskeletal: Negative for back pain.   Physical Exam   Blood pressure 119/71, pulse 87, temperature 99 F (37.2 C), temperature source Oral, resp. rate 18, height 5\' 5"  (1.651 m), weight 82.6 kg, last menstrual period 09/25/2017, currently breastfeeding.  Physical Exam  Nursing note and vitals reviewed. Constitutional: She is oriented to person, place, and time. She appears well-developed and well-nourished. No distress.  HENT:  Head: Normocephalic.  Neck: Normal range of motion.  Cardiovascular: Normal rate.  Respiratory: Effort normal.  GI: Soft. She exhibits no distension and no mass. There is no tenderness. There is no rebound and no guarding.  Genitourinary: There is no rash, tenderness or lesion on the right labia. There is no rash, tenderness or lesion on the left labia. Uterus is enlarged. Uterus is not tender. Cervix exhibits no motion tenderness, no discharge and no friability. No bleeding in the vagina. Vaginal discharge (small amount of white normal appearing discharge without odor.  Neg for bleeding) found.  Neurological: She is alert and oriented to person, place, and time.  Skin: Skin is warm and dry.  Psychiatric: She has a normal mood and affect. Her behavior is normal. Thought content normal.   Results for orders placed or performed during the hospital encounter of 03/26/18 (from the past 24 hour(s))  Urinalysis, Routine w reflex microscopic     Status: Abnormal   Collection Time: 03/26/18  4:39 PM  Result Value Ref Range   Color, Urine YELLOW YELLOW   APPearance CLOUDY (A) CLEAR   Specific Gravity, Urine 1.015 1.005 - 1.030   pH 8.0 5.0 - 8.0   Glucose, UA NEGATIVE NEGATIVE mg/dL   Hgb urine dipstick NEGATIVE NEGATIVE   Bilirubin Urine NEGATIVE NEGATIVE   Ketones, ur NEGATIVE NEGATIVE mg/dL    Protein, ur NEGATIVE NEGATIVE mg/dL   Nitrite NEGATIVE NEGATIVE   Leukocytes, UA NEGATIVE NEGATIVE   MAU Course  Procedures  MDM MSE Exam Lab NST- baseline FHR 150           Variability=Moderate            10X10 accels           Neg for uterine contraction           Assessment and Plan  A"  Abdominal cramping      Nausea without vomiting      Neg for contractions on NSt     Cervix closed      Hx of preterm delivery  P:  Possibly GI sxs with neg findings for contractions or UTI      Bland diet for tonight.       Keep scheduled appointment, call office for worsening sxs.  Dennison Mascot Key 03/26/2018, 5:00 PM

## 2018-03-26 NOTE — Discharge Instructions (Signed)
Abdominal Pain During Pregnancy Abdominal pain is common in pregnancy. Most of the time, it does not cause harm. There are many causes of abdominal pain. Some causes are more serious than others and sometimes the cause is not known. Abdominal pain can be a sign that something is very wrong with the pregnancy or the pain may have nothing to do with the pregnancy. Always tell your health care provider if you have any abdominal pain. Follow these instructions at home:  Do not have sex or put anything in your vagina until your symptoms go away completely.  Watch your abdominal pain for any changes.  Get plenty of rest until your pain improves.  Drink enough fluid to keep your urine clear or pale yellow.  Take over-the-counter or prescription medicines only as told by your health care provider.  Keep all follow-up visits as told by your health care provider. This is important. Contact a health care provider if:  You have a fever.  Your pain gets worse or you have cramping.  Your pain continues after resting. Get help right away if:  You are bleeding, leaking fluid, or passing tissue from the vagina.  You have vomiting or diarrhea that does not go away.  You have painful or bloody urination.  You notice a decrease in your baby's movements.  You feel very weak or faint.  You have shortness of breath.  You develop a severe headache with abdominal pain.  You have abnormal vaginal discharge with abdominal pain. This information is not intended to replace advice given to you by your health care provider. Make sure you discuss any questions you have with your health care provider. Document Released: 07/04/2005 Document Revised: 04/14/2016 Document Reviewed: 01/31/2013 Elsevier Interactive Patient Education  2018 [employer].  [employer] en los adultos (Nausea, Adult) Tener nuseas significa sentir Programme researcher, broadcasting/film/video o ganas de vomitar. Las nuseas en s a menudo no revisten gravedad,  pero pueden ser un signo temprano de problemas mdicos ms graves. Si las nuseas empeoran, pueden ocasionar vmitos. Si vomita o si no puede beber suficiente lquido, corre el riesgo de deshidratarse. La deshidratacin puede hacer que se sienta cansado y sediento, que tenga la boca seca y que orine con menos frecuencia. Los [employer] y las personas que tienen otras enfermedades o un sistema de defensa (inmunitario) dbil tienen mayor riesgo de sufrir deshidratacin. El [place] principal del tratamiento de esta afeccin es lo siguiente:  Limitar la frecuencia con la que siente nuseas.  Prevenir los vmitos y la deshidratacin. CUIDADOS EN EL HOGAR Siga las indicaciones del mdico sobre cmo debe cuidarse en su casa. Comida y bebida Siga estas recomendaciones como se lo haya indicado el mdico:  Tome una solucin de rehidratacin oral (SRO). Es Neomia Dear bebida que se vende en farmacias y tiendas.  Beba lquidos claros en pequeas cantidades segn le sea posible. Por ejemplo: ? [place]. ? Cubitos de hielo. ? Jugo de frutas con agua agregada (jugo de frutas diluido). ? Bebidas deportivas de bajas caloras.  En la medida en que pueda, consuma alimentos blandos y fciles de digerir en pequeas cantidades. Por ejemplo: ? Bananas. ? Pur de [employer]. ? Arroz. ? [employer]J's. ? Tostadas. ? Galletas.  Evite beber lquidos que contengan mucha azcar o cafena.  Evite el alcohol.  Evite los alimentos picantes o grasos. Instrucciones generales  Beba suficiente lquido para mantener el pis (orina) claro o de color amarillo plido.  Lvese las manos con frecuencia. Use un desinfectante para manos  si no dispone de [place] y [place].  Asegrese de que todas las personas que viven en su casa se laven bien las manos y con frecuencia.  Descanse en su casa hasta sentirse mejor.  Tome los medicamentos de venta libre y los recetados solamente como se lo haya indicado el mdico.  Cuando sienta  nuseas, respire lenta y profundamente.  Controle su afeccin para ver si hay cambios.  Concurra a todas las visitas de control como se lo haya indicado el mdico. Esto es importante. SOLICITE AYUDA SI:  Tiene dolores de [place].  Aparecen nuevos sntomas.  El Museum/gallery curator.  Tiene fiebre.  Se siente mareado o siente que va a desvanecerse.  Vomita  No puede retener los lquidos. SOLICITE AYUDA DE INMEDIATO SI:  Siente dolor en el pecho, el cuello, los brazos o la [place].  Se siente muy dbil o se desvanece (se desmaya).  Vomita sangre de color rojo brillante o el vmito se asemeja al poso del caf.  Tiene heces con sangre, de color negro, o con aspecto alquitranado.  Siente un dolor de cabeza intenso, rigidez en el cuello, o ambas cosas.  Tiene dolor muy intenso en el vientre, clicos o meteorismo.  Tiene una erupcin cutnea.  Le cuesta respirar o respira muy rpidamente.  Su corazn late muy rpidamente.  Siente la piel fra y hmeda.  Se siente confundido.  Siente dolor al [employer].  Tiene signos de deshidratacin, por ejemplo: ? [place], muy escasa o [place]. ? Labios agrietados. ? [employer]. ? Ojos hundidos. ? Somnolencia. ? Debilidad. Estos sntomas pueden Customer service manager. No espere hasta que los sntomas desaparezcan. Solicite atencin mdica de inmediato. Comunquese con el servicio de emergencias de su localidad (911 en los [place]). No conduzca por sus propios medios [employer]. Esta informacin no tiene Theme park manager el consejo del mdico. Asegrese de hacerle al mdico cualquier pregunta que tenga. Document Released: 06/23/2011 Document Revised: 10/26/2015 Document Reviewed: 03/10/2015 Elsevier Interactive Patient Education  [employer].

## 2018-03-29 ENCOUNTER — Telehealth: Payer: Self-pay

## 2018-03-29 ENCOUNTER — Encounter: Payer: Self-pay | Admitting: Obstetrics

## 2018-03-29 ENCOUNTER — Ambulatory Visit (INDEPENDENT_AMBULATORY_CARE_PROVIDER_SITE_OTHER): Payer: Medicaid Other

## 2018-03-29 ENCOUNTER — Other Ambulatory Visit: Payer: Self-pay | Admitting: Obstetrics and Gynecology

## 2018-03-29 VITALS — BP 131/85 | HR 93 | Wt 184.4 lb

## 2018-03-29 DIAGNOSIS — O09212 Supervision of pregnancy with history of pre-term labor, second trimester: Secondary | ICD-10-CM | POA: Diagnosis not present

## 2018-03-29 DIAGNOSIS — O0990 Supervision of high risk pregnancy, unspecified, unspecified trimester: Secondary | ICD-10-CM

## 2018-03-29 MED ORDER — HYDROXYPROGESTERONE CAPROATE 275 MG/1.1ML ~~LOC~~ SOAJ
275.0000 mg | Freq: Once | SUBCUTANEOUS | Status: AC
  Administered 2018-03-29: 275 mg via SUBCUTANEOUS

## 2018-03-29 MED ORDER — BUTALBITAL-APAP-CAFFEINE 50-325-40 MG PO TABS: 1.0000 | ORAL_TABLET | Freq: Four times a day (QID) | ORAL | 0 refills | Status: DC | PRN

## 2018-03-29 NOTE — Progress Notes (Signed)
Pt is here for 17P injection only. Inj given in R arm, pt tolerated well.

## 2018-03-29 NOTE — Telephone Encounter (Signed)
Pt was in office today for nurse visit for Makena. Pt states she is having headaches all day and is wondering if there's anything else we can give her for them. States that tylenol and the flexeril she has tried and does not get relief.

## 2018-04-03 ENCOUNTER — Encounter: Payer: Medicaid Other | Admitting: Obstetrics and Gynecology

## 2018-04-05 ENCOUNTER — Encounter: Payer: Self-pay | Admitting: Obstetrics and Gynecology

## 2018-04-05 ENCOUNTER — Ambulatory Visit (INDEPENDENT_AMBULATORY_CARE_PROVIDER_SITE_OTHER): Payer: Medicaid Other | Admitting: Obstetrics and Gynecology

## 2018-04-05 VITALS — BP 120/80 | HR 93 | Wt 183.8 lb

## 2018-04-05 DIAGNOSIS — O10919 Unspecified pre-existing hypertension complicating pregnancy, unspecified trimester: Secondary | ICD-10-CM

## 2018-04-05 DIAGNOSIS — O26899 Other specified pregnancy related conditions, unspecified trimester: Secondary | ICD-10-CM

## 2018-04-05 DIAGNOSIS — O09219 Supervision of pregnancy with history of pre-term labor, unspecified trimester: Secondary | ICD-10-CM

## 2018-04-05 DIAGNOSIS — R519 Headache, unspecified: Secondary | ICD-10-CM

## 2018-04-05 DIAGNOSIS — F32A Depression, unspecified: Secondary | ICD-10-CM

## 2018-04-05 DIAGNOSIS — O99340 Other mental disorders complicating pregnancy, unspecified trimester: Secondary | ICD-10-CM

## 2018-04-05 DIAGNOSIS — F329 Major depressive disorder, single episode, unspecified: Secondary | ICD-10-CM

## 2018-04-05 DIAGNOSIS — O0990 Supervision of high risk pregnancy, unspecified, unspecified trimester: Secondary | ICD-10-CM

## 2018-04-05 DIAGNOSIS — R51 Headache: Secondary | ICD-10-CM

## 2018-04-05 DIAGNOSIS — O09899 Supervision of other high risk pregnancies, unspecified trimester: Secondary | ICD-10-CM

## 2018-04-05 DIAGNOSIS — O09299 Supervision of pregnancy with other poor reproductive or obstetric history, unspecified trimester: Secondary | ICD-10-CM

## 2018-04-05 MED ORDER — BUTALBITAL-APAP-CAFFEINE 50-325-40 MG PO TABS: 1.0000 | ORAL_TABLET | Freq: Four times a day (QID) | ORAL | 1 refills | Status: DC | PRN

## 2018-04-05 MED ORDER — HYDROXYPROGESTERONE CAPROATE 275 MG/1.1ML ~~LOC~~ SOAJ
275.0000 mg | Freq: Once | SUBCUTANEOUS | Status: AC
  Administered 2018-04-05: 275 mg via SUBCUTANEOUS

## 2018-04-05 NOTE — Progress Notes (Signed)
Subjective:  Brittany [name] is a 37 y.o. [phone] at 7081w3d being seen today for ongoing prenatal care.  She is currently monitored for the following issues for this high-risk pregnancy and has HTN in pregnancy, chronic; Subclinical hyperthyroidism; Migraine; Murmur, cardiac; Anemia; Supervision of high risk pregnancy, antepartum; Vitamin D deficiency; H/O LEEP; H/O intrauterine growth restriction in prior pregnancy, currently pregnant; H/O pre-eclampsia in prior pregnancy, currently pregnant; H/O preterm delivery, currently pregnant; Headache in pregnancy, antepartum; Depression affecting pregnancy; and Preterm contractions on their problem list.  Patient reports no complaints.  Contractions: Irritability. Vag. Bleeding: None.  Movement: Present. Denies leaking of fluid.   The following portions of the patient's history were reviewed and updated as appropriate: allergies, current medications, past family history, past medical history, past social history, past surgical history and problem list. Problem list updated.  Objective:   Vitals:   04/05/18 0955  BP: 120/80  Pulse: 93  Weight: 183 lb 12.8 oz (83.4 kg)    Fetal Status: Fetal Heart Rate (bpm): 144   Movement: Present     General:  Alert, oriented and cooperative. Patient is in no acute distress.  Skin: Skin is warm and dry. No rash noted.   Cardiovascular: Normal heart rate noted  Respiratory: Normal respiratory effort, no problems with respiration noted  Abdomen: Soft, gravid, appropriate for gestational age. Pain/Pressure: Absent     Pelvic:  Cervical exam deferred        Extremities: Normal range of motion.  Edema: None  Mental Status: Normal mood and affect. Normal behavior. Normal judgment and thought content.   Urinalysis:      Assessment and Plan:  Pregnancy: [id number] at 2081w3d  1. Supervision of high risk pregnancy, antepartum Stable Glucola next visit - HYDROXYprogesterone Caproate SOAJ 275 mg  2. HTN in pregnancy,  chronic BP stable without meds No S/Sx of PEC  3. H/O preterm delivery, currently pregnant Stable Continue with weekly 17 OHP  4. H/O pre-eclampsia in prior pregnancy, currently pregnant As noted above  5. H/O intrauterine growth restriction in prior pregnancy, currently pregnant Growth next week  6. Headache in pregnancy, antepartum Improved with Fioricet - butalbital-acetaminophen-caffeine (FIORICET, ESGIC) 50-325-40 MG tablet; Take 1-2 tablets by mouth every 6 (six) hours as needed for headache.  Dispense: 20 tablet; Refill: 1  7. Depression affecting pregnancy Stable Has seen Jamie. Pt made aware can see [name] as needed. She has her contact information  Preterm labor symptoms and general obstetric precautions including but not limited to vaginal bleeding, contractions, leaking of fluid and fetal movement were reviewed in detail with the patient. Please refer to After Visit Summary for other counseling recommendations.  Return in about 3 weeks (around 04/26/2018) for OB visit.   Hermina StaggersErvin, Michael L, MD

## 2018-04-05 NOTE — Progress Notes (Signed)
Pt is here for ROB. G9P3 4527w3d. Pt is on Makena, we do not have any refills in office- going to use office stock today- called Makena to refill today, they said it will be here by next thursday. Pt would like to know if she can have a refill on the Fioricet, states that it is helping with her headaches.

## 2018-04-05 NOTE — Patient Instructions (Signed)

## 2018-04-06 ENCOUNTER — Encounter (HOSPITAL_COMMUNITY): Payer: Self-pay | Admitting: *Deleted

## 2018-04-06 ENCOUNTER — Encounter (HOSPITAL_COMMUNITY): Payer: Self-pay

## 2018-04-06 ENCOUNTER — Other Ambulatory Visit: Payer: Self-pay

## 2018-04-06 ENCOUNTER — Inpatient Hospital Stay (HOSPITAL_COMMUNITY)
Admission: AD | Admit: 2018-04-06 | Discharge: 2018-04-06 | Disposition: A | Discharge status: Home / Self Care | Payer: Medicaid Other | Source: Ambulatory Visit | Attending: Obstetrics & Gynecology | Admitting: Obstetrics & Gynecology

## 2018-04-06 DIAGNOSIS — O09899 Supervision of other high risk pregnancies, unspecified trimester: Secondary | ICD-10-CM

## 2018-04-06 DIAGNOSIS — M549 Dorsalgia, unspecified: Secondary | ICD-10-CM | POA: Diagnosis present

## 2018-04-06 DIAGNOSIS — O26892 Other specified pregnancy related conditions, second trimester: Secondary | ICD-10-CM | POA: Diagnosis not present

## 2018-04-06 DIAGNOSIS — O9989 Other specified diseases and conditions complicating pregnancy, childbirth and the puerperium: Secondary | ICD-10-CM

## 2018-04-06 DIAGNOSIS — O4700 False labor before 37 completed weeks of gestation, unspecified trimester: Secondary | ICD-10-CM

## 2018-04-06 DIAGNOSIS — O09299 Supervision of pregnancy with other poor reproductive or obstetric history, unspecified trimester: Secondary | ICD-10-CM

## 2018-04-06 DIAGNOSIS — R102 Pelvic and perineal pain: Secondary | ICD-10-CM | POA: Insufficient documentation

## 2018-04-06 DIAGNOSIS — Z3A25 25 weeks gestation of pregnancy: Secondary | ICD-10-CM | POA: Insufficient documentation

## 2018-04-06 DIAGNOSIS — O10919 Unspecified pre-existing hypertension complicating pregnancy, unspecified trimester: Secondary | ICD-10-CM

## 2018-04-06 DIAGNOSIS — O09219 Supervision of pregnancy with history of pre-term labor, unspecified trimester: Secondary | ICD-10-CM

## 2018-04-06 DIAGNOSIS — R109 Unspecified abdominal pain: Secondary | ICD-10-CM

## 2018-04-06 DIAGNOSIS — N949 Unspecified condition associated with female genital organs and menstrual cycle: Secondary | ICD-10-CM

## 2018-04-06 DIAGNOSIS — Z9889 Other specified postprocedural states: Secondary | ICD-10-CM

## 2018-04-06 DIAGNOSIS — O479 False labor, unspecified: Secondary | ICD-10-CM

## 2018-04-06 HISTORY — DX: Cardiac murmur, unspecified: R01.1

## 2018-04-06 HISTORY — DX: Unspecified infectious disease: B99.9

## 2018-04-06 LAB — URINALYSIS, ROUTINE W REFLEX MICROSCOPIC
BILIRUBIN URINE: NEGATIVE
Glucose, UA: NEGATIVE mg/dL
HGB URINE DIPSTICK: NEGATIVE
Ketones, ur: NEGATIVE mg/dL
Leukocytes, UA: NEGATIVE
NITRITE: NEGATIVE
PROTEIN: NEGATIVE mg/dL
Specific Gravity, Urine: 1.021 (ref 1.005–1.030)
pH: 7 (ref 5.0–8.0)

## 2018-04-06 MED ORDER — COMFORT FIT MATERNITY SUPP LG MISC: 1.0000 [IU] | Freq: Every day | 0 refills | Status: DC | PRN

## 2018-04-06 NOTE — Discharge Instructions (Signed)
PREGNANCY SUPPORT BELT: You are not alone, Seventy-five percent of women have some sort of abdominal or back pain at some point in their pregnancy. Your baby is growing at a fast pace, which means that your whole body is rapidly trying to adjust to the changes. As your uterus grows, your back may start feeling a bit under stress and this can result in back or abdominal pain that can go from mild, and therefore bearable, to severe pains that will not allow you to sit or lay down comfortably, When it comes to dealing with pregnancy-related pains and cramps, some pregnant women usually prefer natural remedies, which the market is filled with nowadays. For example, wearing a pregnancy support belt can help ease and lessen your discomfort and pain. WHAT ARE THE BENEFITS OF WEARING A PREGNANCY SUPPORT BELT? A pregnancy support belt provides support to the lower portion of the belly taking some of the weight of the growing uterus and distributing to the other parts of your body. It is designed make you comfortable and gives you extra support. Over the years, the pregnancy apparel market has been studying the needs and wants of pregnant women and they have come up with the most comfortable pregnancy support belts that woman could ever ask for. In fact, you will no longer have to wear a stretched-out or bulky pregnancy belt that is visible underneath your clothes and makes you feel even more uncomfortable. Nowadays, a pregnancy support belt is made of comfortable and stretchy materials that will not irritate your skin but will actually make you feel at ease and you will not even notice you are wearing it. They are easy to put on and adjust during the day and can be worn at night for additional support.  BENEFITS:  Relives Back pain  Relieves Abdominal Muscle and Leg Pain  Stabilizes the Pelvic Ring  Offers a Cushioned Abdominal Lift Pad  Relieves pressure on the Sciatic Nerve Within Minutes WHERE TO GET  YOUR PREGNANCY BELT: [employer] [phone]  [address] [place], [place] [id number]   Back Pain in Pregnancy Back pain during pregnancy is common. Back pain may be caused by several factors that are related to changes during your pregnancy. Follow these instructions at home: Managing pain, stiffness, and swelling  If directed, apply ice for sudden (acute) back pain. ? Put ice in a plastic bag. ? Place a towel between your skin and the bag. ? Leave the ice on for 20 minutes, 2-3 times per day.  If directed, apply heat to the affected area before you exercise: ? Place a towel between your skin and the heat pack or heating pad. ? Leave the heat on for 20-30 minutes. ? Remove the heat if your skin turns bright red. This is especially important if you are unable to feel pain, heat, or cold. You may have a greater risk of getting burned. Activity  Exercise as told by your health care provider. Exercising is the best way to prevent or manage back pain.  Listen to your body when lifting. If lifting hurts, ask for help or bend your knees. This uses your leg muscles instead of your back muscles.  Squat down when picking up something from the floor. Do not bend over.  Only use bed rest as told by your health care provider. Bed rest should only be used for the most severe episodes of back pain. Standing, Sitting, and Lying Down  Do not stand in one  place for long periods of time.  Use good posture when sitting. Make sure your head rests over your shoulders and is not hanging forward. Use a pillow on your lower back if necessary.  Try sleeping on your side, preferably the left side, with a pillow or two between your legs. If you are sore after a night's rest, your bed may be too soft. A firm mattress may provide more support for your back during pregnancy. General instructions  Do not wear high heels.  Eat a healthy diet. Try to gain weight within your health care  provider's recommendations.  Use a maternity girdle, elastic sling, or back brace as told by your health care provider.  Take over-the-counter and prescription medicines only as told by your health care provider.  Keep all follow-up visits as told by your health care provider. This is important. This includes any visits with any specialists, such as a physical therapist. Contact a health care provider if:  Your back pain interferes with your daily activities.  You have increasing pain in other parts of your body. Get help right away if:  You develop numbness, tingling, weakness, or problems with the use of your arms or legs.  You develop severe back pain that is not controlled with medicine.  You have a sudden change in bowel or bladder control.  You develop shortness of breath, dizziness, or you faint.  You develop nausea, vomiting, or sweating.  You have back pain that is a rhythmic, cramping pain similar to labor pains. Labor pain is usually 1-2 minutes apart, lasts for about 1 minute, and involves a bearing down feeling or pressure in your pelvis.  You have back pain and your water breaks or you have vaginal bleeding.  You have back pain or numbness that travels down your leg.  Your back pain developed after you fell.  You develop pain on one side of your back.  You see blood in your urine.  You develop skin blisters in the area of your back pain. This information is not intended to replace advice given to you by your health care provider. Make sure you discuss any questions you have with your health care provider. Document Released: 10/12/2005 Document Revised: 12/10/2015 Document Reviewed: 03/18/2015 Elsevier Interactive Patient Education  2018 [employer].  [employer] of the uterus can occur throughout pregnancy, but they are not always a sign that you are in labor. You may have practice contractions called Braxton Hicks contractions.  These false labor contractions are sometimes confused with true labor. What are Deberah Pelton contractions? Braxton Hicks contractions are tightening movements that occur in the muscles of the uterus before labor. Unlike true labor contractions, these contractions do not result in opening (dilation) and thinning of the cervix. Toward the end of pregnancy (32-34 weeks), Braxton Hicks contractions can happen more often and may become stronger. These contractions are sometimes difficult to tell apart from true labor because they can be very uncomfortable. You should not feel embarrassed if you go to the hospital with false labor. Sometimes, the only way to tell if you are in true labor is for your health care provider to look for changes in the cervix. The health care provider will do a physical exam and may monitor your contractions. If you are not in true labor, the exam should show that your cervix is not dilating and your water has not broken. If there are other health problems associated with your pregnancy, it is completely  safe for you to be sent home with false labor. You may continue to have Braxton Hicks contractions until you go into true labor. How to tell the difference between true labor and false labor True labor  Contractions last 30-70 seconds.  Contractions become very regular.  Discomfort is usually felt in the top of the uterus, and it spreads to the lower abdomen and low back.  Contractions do not go away with walking.  Contractions usually become more intense and increase in frequency.  The cervix dilates and gets thinner. False labor  Contractions are usually shorter and not as strong as true labor contractions.  Contractions are usually irregular.  Contractions are often felt in the front of the lower abdomen and in the groin.  Contractions may go away when you walk around or change positions while lying down.  Contractions get weaker and are shorter-lasting as time  goes on.  The cervix usually does not dilate or become thin. Follow these instructions at home:  Take over-the-counter and prescription medicines only as told by your health care provider.  Keep up with your usual exercises and follow other instructions from your health care provider.  Eat and drink lightly if you think you are going into labor.  If Braxton Hicks contractions are making you uncomfortable: ? Change your position from lying down or resting to walking, or change from walking to resting. ? Sit and rest in a tub of warm water. ? Drink enough fluid to keep your urine pale yellow. Dehydration may cause these contractions. ? Do slow and deep breathing several times an hour.  Keep all follow-up prenatal visits as told by your health care provider. This is important. Contact a health care provider if:  You have a fever.  You have continuous pain in your abdomen. Get help right away if:  Your contractions become stronger, more regular, and closer together.  You have fluid leaking or gushing from your vagina.  You pass blood-tinged mucus (bloody show).  You have bleeding from your vagina.  You have low back pain that you never had before.  You feel your babys head pushing down and causing pelvic pressure.  Your baby is not moving inside you as much as it used to. Summary  Contractions that occur before labor are called Braxton Hicks contractions, false labor, or practice contractions.  Braxton Hicks contractions are usually shorter, weaker, farther apart, and less regular than true labor contractions. True labor contractions usually become progressively stronger and regular and they become more frequent.  Manage discomfort from [facility] contractions by changing position, resting in a warm bath, drinking plenty of water, or practicing deep breathing. This information is not intended to replace advice given to you by your health care provider. Make sure you  discuss any questions you have with your health care provider. Document Released: 11/17/2016 Document Revised: 11/17/2016 Document Reviewed: 11/17/2016 Elsevier Interactive Patient Education  2018 [employer].

## 2018-04-06 NOTE — MAU Note (Signed)
Pain in low back, goes down into bottom,, pain in lower abd/ pelvis.  Kind of hurts to walk. Pain Is constant.  No bleeding or leaking.

## 2018-04-06 NOTE — MAU Provider Note (Signed)
History     CSN: [id number]  Arrival date and time: 04/06/18 [id number]   First Provider Initiated Contact with Patient 04/06/18 1009      Chief Complaint  Patient presents with  . Abdominal Pain  . Back Pain   HPI  Brittany [name] is a 37 y.o. [phone] at 7875w4d who presents to MAU today with complaint of low back and hip pain as well as lower pelvic pressure since this morning. She states pain is often with ambulation. She denies bleeding, LOF, UTI symptoms or fever. She has had some nausea without vomiting or diarrhea. She states last BM was yesterday. She reports normal fetal movement. She rates pain at 7/10 now and has not taken anything for pain.   OB History as of 04/05/2018    Gravida  9   Para  3   Term  2   Preterm  1   AB  5   Living  3     SAB  4   TAB  1   Ectopic  0   Multiple  0   Live Births  3           Past Medical History:  Diagnosis Date  . Gallstone   . Headache(784.0)   . Heart murmur   . Hypertension   . Infection    UTI  . Maternal chronic hypertension in first trimester 04/09/2011  . Pregnancy induced hypertension    after 2nd delivery  . Vaginal Pap smear, abnormal    cant remember    Past Surgical History:  Procedure Laterality Date  . DILATION AND CURETTAGE OF UTERUS     dont remember year  . LEEP      Family History  Problem Relation Age of Onset  . Asthma Mother   . Hypertension Mother   . Asthma Maternal Grandmother   . Hypertension Maternal Grandmother     Social History   Tobacco Use  . Smoking status: Never Smoker  . Smokeless tobacco: Never Used  Substance Use Topics  . Alcohol use: No    Alcohol/week: 0.0 standard drinks  . Drug use: No    Allergies: No Known Allergies  No medications prior to admission.    Review of Systems  Constitutional: Negative for fever.  Gastrointestinal: Positive for abdominal pain and nausea. Negative for constipation, diarrhea and vomiting.  Genitourinary:  Positive for pelvic pain. Negative for dysuria, frequency, urgency, vaginal bleeding and vaginal discharge.  Musculoskeletal: Positive for back pain.   Physical Exam   Blood pressure 123/77, pulse 83, temperature 98.3 F (36.8 C), temperature source Oral, resp. rate 18, weight 84 kg, last menstrual period 09/25/2017, SpO2 99 %, unknown if currently breastfeeding.  Physical Exam  Nursing note and vitals reviewed. Constitutional: She is oriented to person, place, and time. She appears well-developed and well-nourished. No distress.  HENT:  Head: Normocephalic and atraumatic.  Cardiovascular: Normal rate.  Respiratory: Effort normal.  GI: Soft. She exhibits no distension and no mass. There is no tenderness. There is no rebound and no guarding.  Neurological: She is alert and oriented to person, place, and time.  Skin: Skin is warm and dry. No erythema.  Psychiatric: She has a normal mood and affect.  Dilation: Closed Effacement (%): Thick Cervical Position: Posterior Station: -2 Exam by:: Harlon FlorJ Wenzel PA   Results for orders placed or performed during the hospital encounter of 04/06/18 (from the past 24 hour(s))  Urinalysis, Routine w reflex microscopic  Status: None   Collection Time: 04/06/18  9:39 AM  Result Value Ref Range   Color, Urine YELLOW YELLOW   APPearance CLEAR CLEAR   Specific Gravity, Urine 1.021 1.005 - 1.030   pH 7.0 5.0 - 8.0   Glucose, UA NEGATIVE NEGATIVE mg/dL   Hgb urine dipstick NEGATIVE NEGATIVE   Bilirubin Urine NEGATIVE NEGATIVE   Ketones, ur NEGATIVE NEGATIVE mg/dL   Protein, ur NEGATIVE NEGATIVE mg/dL   Nitrite NEGATIVE NEGATIVE   Leukocytes, UA NEGATIVE NEGATIVE    Fetal Monitoring: Baseline: 130 bpm Variability: moderate Accelerations: 10 x 10 and 15 x 15 Decelerations: none Contractions: none   MAU Course  Procedures None  MDM UA today  FFN collected, cervix closed, FFN discarded.   Assessment and Plan  A: SIUP at [redacted]w[redacted]d Round  ligament pain Pelvic pain in pregnancy, second trimester   P: Discharge home Rx for maternity belt given to patient Advised Tylenol PRN for pain and Flexeril PRN for pain as previously prescribed  Preterm labor precautions discussed Patient advised to follow-up with [facility] as scheduled for routine prenatal care  Patient may return to MAU as needed or if her condition were to change or worsen  [name], PA-C 04/06/2018, 11:21 AM

## 2018-04-07 ENCOUNTER — Inpatient Hospital Stay (HOSPITAL_COMMUNITY)
Admission: AD | Admit: 2018-04-07 | Discharge: 2018-04-07 | Disposition: A | Discharge status: Home / Self Care | Payer: Medicaid Other | Source: Ambulatory Visit | Attending: Obstetrics & Gynecology | Admitting: Obstetrics & Gynecology

## 2018-04-07 ENCOUNTER — Encounter (HOSPITAL_COMMUNITY): Payer: Self-pay | Admitting: *Deleted

## 2018-04-07 DIAGNOSIS — O99891 Other specified diseases and conditions complicating pregnancy: Secondary | ICD-10-CM

## 2018-04-07 DIAGNOSIS — R109 Unspecified abdominal pain: Secondary | ICD-10-CM

## 2018-04-07 DIAGNOSIS — M545 Low back pain: Secondary | ICD-10-CM | POA: Diagnosis present

## 2018-04-07 DIAGNOSIS — M549 Dorsalgia, unspecified: Secondary | ICD-10-CM | POA: Diagnosis not present

## 2018-04-07 DIAGNOSIS — O26892 Other specified pregnancy related conditions, second trimester: Secondary | ICD-10-CM | POA: Diagnosis not present

## 2018-04-07 DIAGNOSIS — Z3A25 25 weeks gestation of pregnancy: Secondary | ICD-10-CM | POA: Insufficient documentation

## 2018-04-07 DIAGNOSIS — O9989 Other specified diseases and conditions complicating pregnancy, childbirth and the puerperium: Secondary | ICD-10-CM

## 2018-04-07 LAB — URINALYSIS, ROUTINE W REFLEX MICROSCOPIC
BILIRUBIN URINE: NEGATIVE
Glucose, UA: NEGATIVE mg/dL
HGB URINE DIPSTICK: NEGATIVE
Ketones, ur: NEGATIVE mg/dL
Leukocytes, UA: NEGATIVE
NITRITE: NEGATIVE
PROTEIN: NEGATIVE mg/dL
Specific Gravity, Urine: 1.017 (ref 1.005–1.030)
pH: 7 (ref 5.0–8.0)

## 2018-04-07 MED ORDER — BUTALBITAL-APAP-CAFFEINE 50-325-40 MG PO TABS
1.0000 | ORAL_TABLET | Freq: Four times a day (QID) | ORAL | 0 refills | Status: DC | PRN
Start: 2018-04-07 — End: 2018-04-29

## 2018-04-07 MED ORDER — ACETAMINOPHEN 500 MG PO TABS
1000.0000 mg | ORAL_TABLET | Freq: Once | ORAL | Status: AC
  Administered 2018-04-07: 1000 mg via ORAL
  Filled 2018-04-07: qty 2

## 2018-04-07 NOTE — MAU Note (Signed)
Brittany ArnoldStacy Jimmie MollyLerika Cashatt is a 37 y.o. at 8299w5d here in MAU reporting: Lower back pain and pelvic pressure. Onset of complaint: has been ongoing and states was seen here yesterday for the same complaints. Pain score: 8/10 Vitals:   04/07/18 1348  BP: 125/83  Pulse: 82  Resp: 17  Temp: 98.2 F (36.8 C)  SpO2: 100%     FHT:156 Lab orders placed from triage: ua

## 2018-04-07 NOTE — MAU Provider Note (Addendum)
History   States he is a 37 year old female in today she is a G9 P2-1-5-3 at 25 weeks and 5 days in with chronic low back pain and pelvic pain with this pregnancy.  She has been seen on multiple occasions for this complaint.  She denies any vaginal bleeding or rupture membranes.  She denies any contractions.  She states the pain is a low sharp pulling pain that is worse when she is upright.  CSN: [id number]  Arrival date & time 04/07/18  1324   First Provider Initiated Contact with Patient 04/07/18 1512      Chief Complaint  Patient presents with  . Back Pain  . pelvic pressure    HPI  Past Medical History:  Diagnosis Date  . Gallstone   . Headache(784.0)   . Heart murmur   . Hypertension   . Infection    UTI  . Maternal chronic hypertension in first trimester 04/09/2011  . Pregnancy induced hypertension    after 2nd delivery  . Vaginal Pap smear, abnormal    cant remember    Past Surgical History:  Procedure Laterality Date  . DILATION AND CURETTAGE OF UTERUS     dont remember year  . LEEP      Family History  Problem Relation Age of Onset  . Asthma Mother   . Hypertension Mother   . Asthma Maternal Grandmother   . Hypertension Maternal Grandmother     Social History   Tobacco Use  . Smoking status: Never Smoker  . Smokeless tobacco: Never Used  Substance Use Topics  . Alcohol use: No    Alcohol/week: 0.0 standard drinks  . Drug use: No    OB History    Gravida  9   Para  3   Term  2   Preterm  1   AB  5   Living  3     SAB  4   TAB  1   Ectopic  0   Multiple  0   Live Births  3           Review of Systems  Constitutional: Negative.   HENT: Negative.   Eyes: Negative.   Respiratory: Negative.   Cardiovascular: Negative.   Gastrointestinal: Positive for abdominal pain.  Endocrine: Negative.   Genitourinary: Negative.   Musculoskeletal: Positive for back pain.  Skin: Negative.   Allergic/Immunologic: Negative.    Neurological: Negative.   Hematological: Negative.   Psychiatric/Behavioral: Negative.     Allergies  Patient has no known allergies.  Home Medications    BP 125/83 (BP Location: Right Arm)   Pulse 82   Temp 98.2 F (36.8 C) (Oral)   Resp 17   Wt 83.5 kg   LMP 09/25/2017   SpO2 100% Comment: ra  BMI 30.63 kg/m   Physical Exam  Constitutional: She is oriented to person, place, and time. She appears well-developed and well-nourished.  HENT:  Head: Normocephalic.  Eyes: Pupils are equal, round, and reactive to light.  Neck: Normal range of motion.  Cardiovascular: Normal rate, regular rhythm, normal heart sounds and intact distal pulses.  Pulmonary/Chest: Effort normal and breath sounds normal.  Abdominal: Soft. Bowel sounds are normal.  Genitourinary: Vagina normal and uterus normal.  Musculoskeletal: Normal range of motion.  Neurological: She is alert and oriented to person, place, and time.  Skin: Skin is warm and dry.  Psychiatric: She has a normal mood and affect. Her behavior is normal. Judgment and thought content normal.  MAU Course  Procedures (including critical care time)  Labs Reviewed  URINALYSIS, ROUTINE W REFLEX MICROSCOPIC - Abnormal; Notable for the following components:      Result Value   APPearance HAZY (*)    All other components within normal limits   No results found.   1. Abdominal pain in pregnancy, second trimester   2. Back pain affecting pregnancy in second trimester       MDM  Vital signs are stable fetal heart rate pattern is reassuring with no decelerations.  No contractions.  Urinalysis is normal..  Sterile vaginal exam was performe cervix is closed posterior and high. I had a lengthy discussion with this patient in regards to this problem that is probably going to be a chronic problem to the risks of the pregnancy.  She verbalized understanding of this.  Going to discharge her home.  I encouraged her to wear maternity belt for  support.  And to keep her appointments at the office

## 2018-04-07 NOTE — Discharge Instructions (Signed)
Back Pain in Pregnancy Back pain during pregnancy is common. Back pain may be caused by several factors that are related to changes during your pregnancy. Follow these instructions at home: Managing pain, stiffness, and swelling  If directed, apply ice for sudden (acute) back pain. ? Put ice in a plastic bag. ? Place a towel between your skin and the bag. ? Leave the ice on for 20 minutes, 2-3 times per day.  If directed, apply heat to the affected area before you exercise: ? Place a towel between your skin and the heat pack or heating pad. ? Leave the heat on for 20-30 minutes. ? Remove the heat if your skin turns bright red. This is especially important if you are unable to feel pain, heat, or cold. You may have a greater risk of getting burned. Activity  Exercise as told by your health care provider. Exercising is the best way to prevent or manage back pain.  Listen to your body when lifting. If lifting hurts, ask for help or bend your knees. This uses your leg muscles instead of your back muscles.  Squat down when picking up something from the floor. Do not bend over.  Only use bed rest as told by your health care provider. Bed rest should only be used for the most severe episodes of back pain. Standing, Sitting, and Lying Down  Do not stand in one place for long periods of time.  Use good posture when sitting. Make sure your head rests over your shoulders and is not hanging forward. Use a pillow on your lower back if necessary.  Try sleeping on your side, preferably the left side, with a pillow or two between your legs. If you are sore after a night's rest, your bed may be too soft. A firm mattress may provide more support for your back during pregnancy. General instructions  Do not wear high heels.  Eat a healthy diet. Try to gain weight within your health care provider's recommendations.  Use a maternity girdle, elastic sling, or back brace as told by your health care  provider.  Take over-the-counter and prescription medicines only as told by your health care provider.  Keep all follow-up visits as told by your health care provider. This is important. This includes any visits with any specialists, such as a physical therapist. Contact a health care provider if:  Your back pain interferes with your daily activities.  You have increasing pain in other parts of your body. Get help right away if:  You develop numbness, tingling, weakness, or problems with the use of your arms or legs.  You develop severe back pain that is not controlled with medicine.  You have a sudden change in bowel or bladder control.  You develop shortness of breath, dizziness, or you faint.  You develop nausea, vomiting, or sweating.  You have back pain that is a rhythmic, cramping pain similar to labor pains. Labor pain is usually 1-2 minutes apart, lasts for about 1 minute, and involves a bearing down feeling or pressure in your pelvis.  You have back pain and your water breaks or you have vaginal bleeding.  You have back pain or numbness that travels down your leg.  Your back pain developed after you fell.  You develop pain on one side of your back.  You see blood in your urine.  You develop skin blisters in the area of your back pain. This information is not intended to replace advice given to you   by your health care provider. Make sure you discuss any questions you have with your health care provider. Document Released: 10/12/2005 Document Revised: 12/10/2015 Document Reviewed: 03/18/2015 Elsevier Interactive Patient Education  2018 [employer]  

## 2018-04-10 ENCOUNTER — Ambulatory Visit (HOSPITAL_COMMUNITY)
Admission: RE | Admit: 2018-04-10 | Discharge: 2018-04-10 | Disposition: A | Discharge status: Home / Self Care | Payer: Medicaid Other | Source: Ambulatory Visit | Attending: Obstetrics and Gynecology | Admitting: Obstetrics and Gynecology

## 2018-04-10 ENCOUNTER — Other Ambulatory Visit (HOSPITAL_COMMUNITY): Payer: Self-pay | Admitting: *Deleted

## 2018-04-10 ENCOUNTER — Encounter (HOSPITAL_COMMUNITY): Payer: Self-pay

## 2018-04-10 DIAGNOSIS — O99282 Endocrine, nutritional and metabolic diseases complicating pregnancy, second trimester: Secondary | ICD-10-CM | POA: Insufficient documentation

## 2018-04-10 DIAGNOSIS — Z3A26 26 weeks gestation of pregnancy: Secondary | ICD-10-CM | POA: Insufficient documentation

## 2018-04-10 DIAGNOSIS — O10012 Pre-existing essential hypertension complicating pregnancy, second trimester: Secondary | ICD-10-CM | POA: Diagnosis present

## 2018-04-10 DIAGNOSIS — O09292 Supervision of pregnancy with other poor reproductive or obstetric history, second trimester: Secondary | ICD-10-CM | POA: Diagnosis not present

## 2018-04-10 DIAGNOSIS — Z363 Encounter for antenatal screening for malformations: Secondary | ICD-10-CM

## 2018-04-10 DIAGNOSIS — O10919 Unspecified pre-existing hypertension complicating pregnancy, unspecified trimester: Secondary | ICD-10-CM

## 2018-04-10 DIAGNOSIS — O3442 Maternal care for other abnormalities of cervix, second trimester: Secondary | ICD-10-CM | POA: Diagnosis not present

## 2018-04-11 ENCOUNTER — Inpatient Hospital Stay (HOSPITAL_COMMUNITY)
Admission: AD | Admit: 2018-04-11 | Discharge: 2018-04-11 | Disposition: A | Discharge status: Home / Self Care | Payer: Medicaid Other | Source: Ambulatory Visit | Attending: Obstetrics and Gynecology | Admitting: Obstetrics and Gynecology

## 2018-04-11 ENCOUNTER — Encounter (HOSPITAL_COMMUNITY): Payer: Self-pay | Admitting: *Deleted

## 2018-04-11 DIAGNOSIS — D649 Anemia, unspecified: Secondary | ICD-10-CM | POA: Diagnosis not present

## 2018-04-11 DIAGNOSIS — O99012 Anemia complicating pregnancy, second trimester: Secondary | ICD-10-CM | POA: Diagnosis not present

## 2018-04-11 DIAGNOSIS — Z3A26 26 weeks gestation of pregnancy: Secondary | ICD-10-CM | POA: Diagnosis not present

## 2018-04-11 DIAGNOSIS — R51 Headache: Secondary | ICD-10-CM | POA: Diagnosis not present

## 2018-04-11 DIAGNOSIS — R102 Pelvic and perineal pain: Secondary | ICD-10-CM

## 2018-04-11 DIAGNOSIS — Z3689 Encounter for other specified antenatal screening: Secondary | ICD-10-CM

## 2018-04-11 DIAGNOSIS — O26892 Other specified pregnancy related conditions, second trimester: Secondary | ICD-10-CM | POA: Diagnosis not present

## 2018-04-11 DIAGNOSIS — O99013 Anemia complicating pregnancy, third trimester: Secondary | ICD-10-CM | POA: Diagnosis not present

## 2018-04-11 DIAGNOSIS — Z7982 Long term (current) use of aspirin: Secondary | ICD-10-CM | POA: Diagnosis not present

## 2018-04-11 LAB — URINALYSIS, ROUTINE W REFLEX MICROSCOPIC
Bilirubin Urine: NEGATIVE
Glucose, UA: NEGATIVE mg/dL
HGB URINE DIPSTICK: NEGATIVE
Ketones, ur: NEGATIVE mg/dL
Leukocytes, UA: NEGATIVE
Nitrite: NEGATIVE
Protein, ur: NEGATIVE mg/dL
SPECIFIC GRAVITY, URINE: 1.017 (ref 1.005–1.030)
pH: 7 (ref 5.0–8.0)

## 2018-04-11 LAB — CBC
HEMATOCRIT: 32.2 % — AB (ref 36.0–46.0)
HEMOGLOBIN: 10.8 g/dL — AB (ref 12.0–15.0)
MCH: 28.6 pg (ref 26.0–34.0)
MCHC: 33.5 g/dL (ref 30.0–36.0)
MCV: 85.4 fL (ref 78.0–100.0)
Platelets: 204 10*3/uL (ref 150–400)
RBC: 3.77 MIL/uL — ABNORMAL LOW (ref 3.87–5.11)
RDW: 14 % (ref 11.5–15.5)
WBC: 13.8 10*3/uL — ABNORMAL HIGH (ref 4.0–10.5)

## 2018-04-11 LAB — WET PREP, GENITAL
Clue Cells Wet Prep HPF POC: NONE SEEN
SPERM: NONE SEEN
Trich, Wet Prep: NONE SEEN
YEAST WET PREP: NONE SEEN

## 2018-04-11 MED ORDER — FERROUS SULFATE DRIED ER 160 (50 FE) MG PO TBCR: 1.0000 | EXTENDED_RELEASE_TABLET | ORAL | 0 refills | Status: DC

## 2018-04-11 MED ORDER — ACETAMINOPHEN 500 MG PO TABS
1000.0000 mg | ORAL_TABLET | Freq: Once | ORAL | Status: AC
  Administered 2018-04-11: 1000 mg via ORAL
  Filled 2018-04-11: qty 2

## 2018-04-11 NOTE — MAU Provider Note (Signed)
History     CSN: [id number]  Arrival date and time: 04/11/18 2135   First Provider Initiated Contact with Patient 04/11/18 2206      Chief Complaint  Patient presents with  . Headache  . Abdominal Pain   HPI  Brittany [name] is a 37 y.o. [phone] at [redacted]w[redacted]d who presents to MAU with multiple complaints. Denies abdominal pain, vaginal bleeding, leaking of fluid, decreased fetal movement, fever, falls, or recent illness.    Headache This is a new problem, onset this morning. Patient endorses bilateral frontal headache "right behind both eyes", rates as 7/10, does not radiate, no aggravating or alleviating factors. Previously prescribed Fioricet but does not take because it causes her to be sleepy and she does all the driving for her family. Denies aura, visual disturbances, syncope, SOB.  Pelvic pain This is a recurring problem this pregnancy, initially noted 04/06/18. Patient endorses bilateral pelvic pain 7/10 as well as "pressure" in her pubic bones. States the sensation is similar to previous MAU visits but "more intense", especially when voiding. Has not taken medication for this problem. Denies aggravating or alleviating factors. Previously prescribed maternity belt, not currently using.  Dizziness This is a new problem, onset this morning. Patient states she was running errands when she felt a sudden sense that she was about to faint. Sensation resolved without intervention. Patient denies syncope. Patient endorses PO hydration with water throughout the day as well as regular meals and bowel movements.   OB History    Gravida  9   Para  3   Term  2   Preterm  1   AB  5   Living  3     SAB  4   TAB  1   Ectopic  0   Multiple  0   Live Births  3           Past Medical History:  Diagnosis Date  . Gallstone   . Headache(784.0)   . Heart murmur   . Hypertension   . Infection    UTI  . Maternal chronic hypertension in first trimester 04/09/2011  . Pregnancy  induced hypertension    after 2nd delivery  . Vaginal Pap smear, abnormal    cant remember    Past Surgical History:  Procedure Laterality Date  . DILATION AND CURETTAGE OF UTERUS     dont remember year  . LEEP      Family History  Problem Relation Age of Onset  . Asthma Mother   . Hypertension Mother   . Asthma Maternal Grandmother   . Hypertension Maternal Grandmother     Social History   Tobacco Use  . Smoking status: Never Smoker  . Smokeless tobacco: Never Used  Substance Use Topics  . Alcohol use: No    Alcohol/week: 0.0 standard drinks  . Drug use: No    Allergies: No Known Allergies  Facility-Administered Medications Prior to Admission  Medication Dose Route Frequency Provider Last Rate Last Dose  . HYDROXYprogesterone Caproate SOAJ 275 mg  275 mg Subcutaneous Weekly Hermina Staggers, MD   275 mg at 03/22/18 0930   Medications Prior to Admission  Medication Sig Dispense Refill Last Dose  . aspirin EC 81 MG tablet Take 1 tablet (81 mg total) by mouth daily. Take after 12 weeks for prevention of preeclampsia later in pregnancy 300 tablet 2 04/11/2018 at Unknown time  . butalbital-acetaminophen-caffeine (FIORICET, ESGIC) 50-325-40 MG tablet Take 1-2 tablets by mouth every 6 (  six) hours as needed for headache. 20 tablet 0 04/10/2018 at Unknown time  . cyclobenzaprine (FLEXERIL) 10 MG tablet Take 0.5-1 tablets (5-10 mg total) by mouth at bedtime as needed for muscle spasms. 10 tablet 0 04/10/2018 at Unknown time  . Elastic Bandages & Supports (COMFORT FIT MATERNITY SUPP LG) MISC 1 Units by Does not apply route daily as needed. 1 each 0 Past Week at Unknown time  . Prenatal Vit-Fe Fumarate-FA (PRENATAL MULTIVITAMIN) TABS tablet Take 1 tablet by mouth daily at 12 noon.   04/11/2018 at Unknown time  . Vitamin D, Ergocalciferol, (DRISDOL) 50000 units CAPS capsule Take 1 capsule (50,000 Units total) by mouth every 7 (seven) days. 30 capsule 2 More than a month at Unknown time     Review of Systems  Constitutional: Negative for fatigue and fever.  Respiratory: Negative for chest tightness and shortness of breath.   Gastrointestinal: Negative for abdominal distention, nausea and vomiting.  Genitourinary: Positive for pelvic pain. Negative for difficulty urinating, dyspareunia, dysuria, vaginal bleeding, vaginal discharge and vaginal pain.       Endorses intermittent pressure with onset of voiding  Musculoskeletal: Negative for gait problem.  Neurological: Positive for dizziness and headaches. Negative for seizures and syncope.  All other systems reviewed and are negative.  Physical Exam   Height 5\' 5"  (1.651 m), weight 84.6 kg, last menstrual period 09/25/2017, unknown if currently breastfeeding.  Physical Exam  Nursing note and vitals reviewed. Constitutional: She is oriented to person, place, and time. She appears well-developed and well-nourished.  Cardiovascular: Normal rate and intact distal pulses.  Respiratory: Effort normal. No respiratory distress.  GI: She exhibits no distension. There is no tenderness. There is no rebound and no guarding.  Gravid  Genitourinary: No vaginal discharge found.  Neurological: She is alert and oriented to person, place, and time. She has normal reflexes. Coordination and gait normal.  Skin: Skin is warm and dry.  Psychiatric: She has a normal mood and affect. Her behavior is normal. Judgment and thought content normal.    MAU Course  Procedures  MDM --Normotensive in MAU --Reactive fetal tracing: baseline 140, moderate variability, positive accelerations, no decelerations --Toco: quiet --Patient not adherent to previously prescribed interventions for pelvic pain --Patient compliant with weekly 17P --Headache improved with Tylenol given in MAU  Patient Vitals for the past 24 hrs:  BP Temp Temp src Pulse Resp Height Weight  04/11/18 2310 111/70 98.3 F (36.8 C) Oral 79 17 - -  04/11/18 2154 125/72 - - - - 5'  5" (1.651 m) 84.6 kg     Results for orders placed or performed during the hospital encounter of 04/11/18 (from the past 24 hour(s))  Urinalysis, Routine w reflex microscopic     Status: Abnormal   Collection Time: 04/11/18  9:54 PM  Result Value Ref Range   Color, Urine YELLOW YELLOW   APPearance HAZY (A) CLEAR   Specific Gravity, Urine 1.017 1.005 - 1.030   pH 7.0 5.0 - 8.0   Glucose, UA NEGATIVE NEGATIVE mg/dL   Hgb urine dipstick NEGATIVE NEGATIVE   Bilirubin Urine NEGATIVE NEGATIVE   Ketones, ur NEGATIVE NEGATIVE mg/dL   Protein, ur NEGATIVE NEGATIVE mg/dL   Nitrite NEGATIVE NEGATIVE   Leukocytes, UA NEGATIVE NEGATIVE  Wet prep, genital     Status: Abnormal   Collection Time: 04/11/18 10:14 PM  Result Value Ref Range   Yeast Wet Prep HPF POC NONE SEEN NONE SEEN   Trich, Wet Prep NONE SEEN  NONE SEEN   Clue Cells Wet Prep HPF POC NONE SEEN NONE SEEN   WBC, Wet Prep HPF POC MANY (A) NONE SEEN   Sperm NONE SEEN   CBC     Status: Abnormal   Collection Time: 04/11/18 10:28 PM  Result Value Ref Range   WBC 13.8 (H) 4.0 - 10.5 K/uL   RBC 3.77 (L) 3.87 - 5.11 MIL/uL   Hemoglobin 10.8 (L) 12.0 - 15.0 g/dL   HCT 91.4 (L) 78.2 - 95.6 %   MCV 85.4 78.0 - 100.0 fL   MCH 28.6 26.0 - 34.0 pg   MCHC 33.5 30.0 - 36.0 g/dL   RDW 21.3 08.6 - 57.8 %   Platelets 204 150 - 400 K/uL   Meds ordered this encounter  Medications  . acetaminophen (TYLENOL) tablet 1,000 mg  . ferrous sulfate (SLOW IRON) 160 (50 Fe) MG TBCR SR tablet    Sig: Take 1 tablet (160 mg total) by mouth every 3 (three) days.    Dispense:  30 each    Refill:  0    Order Specific Question:   Supervising Provider    Answer:   Reva Bores [[id number]]    Assessment and Plan  --37 y.o. [phone] at [redacted]w[redacted]d  --Reactive fetal tracing --Closed cervix --Anemia with new onset dizziness, discussed hemodilution approaching third trimester, initiate Fe as prescribed above --Recurring pelvic pain, discussed utilizing previously  prescribed maternity belt and medication --Headache improved with Tylenol administered in MAU --Reviewed general obstetric precautions including but not limited to falls, fever, vaginal bleeding, leaking of fluid, decreased    fetal movement, headache not relieved by Tylenol or Fioricet, rest and PO hydration.  F/U: Weekly 17P tomorrow 04/12/18, next OB appt 04/26/18 @ [facility]  Calvert Cantor, CNM 04/11/2018, 11:18 PM

## 2018-04-11 NOTE — MAU Note (Signed)
Pt presents to MAU c/o a HA, feeling faint, and lower abdominal pain that is a 7/10 on the pain scale. Pt also states the abdominal pain is lower and sharp and radiates to her left side. Pt reports +FM no bleeding or LOF.

## 2018-04-11 NOTE — Discharge Instructions (Signed)

## 2018-04-12 ENCOUNTER — Ambulatory Visit: Payer: Medicaid Other

## 2018-04-12 VITALS — BP 127/80 | HR 81 | Wt 188.4 lb

## 2018-04-12 DIAGNOSIS — O09899 Supervision of other high risk pregnancies, unspecified trimester: Secondary | ICD-10-CM

## 2018-04-12 DIAGNOSIS — O09219 Supervision of pregnancy with history of pre-term labor, unspecified trimester: Principal | ICD-10-CM

## 2018-04-12 NOTE — Progress Notes (Signed)
Patient is in the office for 17-p injection, administered in R arm and pt tolerated well .Marland Kitchen Administrations This Visit    HYDROXYprogesterone Caproate SOAJ 275 mg    Admin Date 04/12/2018 Action Given Dose 275 mg Route Subcutaneous Administered By Katrina Stack, RN

## 2018-04-12 NOTE — Progress Notes (Signed)
I have reviewed this chart and agree with the RN/CMA assessment and management.    K. Meryl Davis, M.D. [facility]  

## 2018-04-15 ENCOUNTER — Encounter (HOSPITAL_COMMUNITY): Payer: Self-pay

## 2018-04-15 ENCOUNTER — Inpatient Hospital Stay (HOSPITAL_COMMUNITY)
Admission: AD | Admit: 2018-04-15 | Discharge: 2018-04-15 | Disposition: A | Discharge status: Home / Self Care | Payer: Medicaid Other | Source: Ambulatory Visit | Attending: Obstetrics & Gynecology | Admitting: Obstetrics & Gynecology

## 2018-04-15 DIAGNOSIS — M545 Low back pain: Secondary | ICD-10-CM | POA: Diagnosis present

## 2018-04-15 DIAGNOSIS — O9989 Other specified diseases and conditions complicating pregnancy, childbirth and the puerperium: Secondary | ICD-10-CM

## 2018-04-15 DIAGNOSIS — O26892 Other specified pregnancy related conditions, second trimester: Secondary | ICD-10-CM | POA: Insufficient documentation

## 2018-04-15 DIAGNOSIS — M549 Dorsalgia, unspecified: Secondary | ICD-10-CM

## 2018-04-15 DIAGNOSIS — Z7982 Long term (current) use of aspirin: Secondary | ICD-10-CM | POA: Insufficient documentation

## 2018-04-15 DIAGNOSIS — Z3A26 26 weeks gestation of pregnancy: Secondary | ICD-10-CM | POA: Insufficient documentation

## 2018-04-15 DIAGNOSIS — O99891 Other specified diseases and conditions complicating pregnancy: Secondary | ICD-10-CM

## 2018-04-15 LAB — URINALYSIS, ROUTINE W REFLEX MICROSCOPIC
Bilirubin Urine: NEGATIVE
Glucose, UA: 150 mg/dL — AB
Hgb urine dipstick: NEGATIVE
KETONES UR: NEGATIVE mg/dL
Leukocytes, UA: NEGATIVE
NITRITE: NEGATIVE
PROTEIN: NEGATIVE mg/dL
Specific Gravity, Urine: 1.016 (ref 1.005–1.030)
pH: 6 (ref 5.0–8.0)

## 2018-04-15 MED ORDER — GABAPENTIN 300 MG PO CAPS: 300.0000 mg | ORAL_CAPSULE | Freq: Two times a day (BID) | ORAL | 0 refills | Status: DC

## 2018-04-15 MED ORDER — GABAPENTIN 300 MG PO CAPS
300.0000 mg | ORAL_CAPSULE | Freq: Once | ORAL | Status: AC
  Administered 2018-04-15: 300 mg via ORAL
  Filled 2018-04-15: qty 1

## 2018-04-15 NOTE — MAU Note (Signed)
Woke up this AM with back pain, states it radiates from lower back up to her shoulder blades, the pain feels right on top of her spinal cord, it is sharp in nature and constant  No bleeding, no LOF, + FM

## 2018-04-15 NOTE — Discharge Instructions (Signed)
Back Pain in Pregnancy Back pain during pregnancy is common. Back pain may be caused by several factors that are related to changes during your pregnancy. Follow these instructions at home: Managing pain, stiffness, and swelling  If directed, apply ice for sudden (acute) back pain. ? Put ice in a plastic bag. ? Place a towel between your skin and the bag. ? Leave the ice on for 20 minutes, 2-3 times per day.  If directed, apply heat to the affected area before you exercise: ? Place a towel between your skin and the heat pack or heating pad. ? Leave the heat on for 20-30 minutes. ? Remove the heat if your skin turns bright red. This is especially important if you are unable to feel pain, heat, or cold. You may have a greater risk of getting burned. Activity  Exercise as told by your health care provider. Exercising is the best way to prevent or manage back pain.  Listen to your body when lifting. If lifting hurts, ask for help or bend your knees. This uses your leg muscles instead of your back muscles.  Squat down when picking up something from the floor. Do not bend over.  Only use bed rest as told by your health care provider. Bed rest should only be used for the most severe episodes of back pain. Standing, Sitting, and Lying Down  Do not stand in one place for long periods of time.  Use good posture when sitting. Make sure your head rests over your shoulders and is not hanging forward. Use a pillow on your lower back if necessary.  Try sleeping on your side, preferably the left side, with a pillow or two between your legs. If you are sore after a night's rest, your bed may be too soft. A firm mattress may provide more support for your back during pregnancy. General instructions  Do not wear high heels.  Eat a healthy diet. Try to gain weight within your health care provider's recommendations.  Use a maternity girdle, elastic sling, or back brace as told by your health care  provider.  Take over-the-counter and prescription medicines only as told by your health care provider.  Keep all follow-up visits as told by your health care provider. This is important. This includes any visits with any specialists, such as a physical therapist. Contact a health care provider if:  Your back pain interferes with your daily activities.  You have increasing pain in other parts of your body. Get help right away if:  You develop numbness, tingling, weakness, or problems with the use of your arms or legs.  You develop severe back pain that is not controlled with medicine.  You have a sudden change in bowel or bladder control.  You develop shortness of breath, dizziness, or you faint.  You develop nausea, vomiting, or sweating.  You have back pain that is a rhythmic, cramping pain similar to labor pains. Labor pain is usually 1-2 minutes apart, lasts for about 1 minute, and involves a bearing down feeling or pressure in your pelvis.  You have back pain and your water breaks or you have vaginal bleeding.  You have back pain or numbness that travels down your leg.  Your back pain developed after you fell.  You develop pain on one side of your back.  You see blood in your urine.  You develop skin blisters in the area of your back pain. This information is not intended to replace advice given to you   by your health care provider. Make sure you discuss any questions you have with your health care provider. Document Released: 10/12/2005 Document Revised: 12/10/2015 Document Reviewed: 03/18/2015 Elsevier Interactive Patient Education  2018 [employer]  

## 2018-04-15 NOTE — MAU Provider Note (Signed)
Chief Complaint:  Back Pain   First Provider Initiated Contact with Patient 04/15/18 1407     HPI: Brittany [name] is a 37 y.o. [phone] at [redacted]w[redacted]d who presents to maternity admissions reporting significant exacerbation of low back pain.  Has had problems with low back pain and every pregnancy but this is the worse it has ever been.  No improvement with Tylenol, Flexeril, maternity support belt.  Location: mid-low back Quality: sharp Severity: 8/10 in pain scale Duration: several weeks Course: Worsening Radiation: Left outer thigh and today radiating into midback Context: [redacted] weeks pregnant, multip, Hx LBP w/ pregnancies Timing: Constant Modifying factors: No improvement w/ Tylenol, Flexeril, Maternity support belt. Associated signs and symptoms: Neg for fever, chills, difficulties w/ gait or coordination, loss of bowel or bladder control.  Denies contractions, leakage of fluid or vaginal bleeding. Good fetal movement.   Pregnancy Course: CHTN   Past Medical History:  Diagnosis Date  . Gallstone   . Headache(784.0)   . Heart murmur   . Hypertension   . Infection    UTI  . Maternal chronic hypertension in first trimester 04/09/2011  . Pregnancy induced hypertension    after 2nd delivery  . Vaginal Pap smear, abnormal    cant remember   OB History  Gravida Para Term Preterm AB Living  9 3 2 1 5 3   SAB TAB Ectopic Multiple Live Births  4 1 0 0 3    # Outcome Date GA Lbr Len/2nd Weight Sex Delivery Anes PTL Lv  9 Current           8 Term 05/03/16 [redacted]w[redacted]d 00:08 / 00:01 2175 g M Vag-Spont None  LIV  7 Preterm 04/06/11 [redacted]w[redacted]d 08:25 / 00:08 2455 g F Vag-Spont None  LIV     Complications: PIH (pregnancy induced hypertension)  6 Term 10/21/05 [redacted]w[redacted]d    Vag-Spont        Complications: Failure to Progress in Second Stage  5 TAB           4 SAB           3 SAB           2 SAB           1 SAB            Past Surgical History:  Procedure Laterality Date  . DILATION AND CURETTAGE OF  UTERUS     dont remember year  . LEEP     Family History  Problem Relation Age of Onset  . Asthma Mother   . Hypertension Mother   . Asthma Maternal Grandmother   . Hypertension Maternal Grandmother    Social History   Tobacco Use  . Smoking status: Never Smoker  . Smokeless tobacco: Never Used  Substance Use Topics  . Alcohol use: No    Alcohol/week: 0.0 standard drinks  . Drug use: No   No Known Allergies Facility-Administered Medications Prior to Admission  Medication Dose Route Frequency Provider Last Rate Last Dose  . HYDROXYprogesterone Caproate SOAJ 275 mg  275 mg Subcutaneous Weekly Hermina Staggers, MD   275 mg at 04/12/18 1028   Medications Prior to Admission  Medication Sig Dispense Refill Last Dose  . aspirin EC 81 MG tablet Take 1 tablet (81 mg total) by mouth daily. Take after 12 weeks for prevention of preeclampsia later in pregnancy 300 tablet 2 04/14/2018 at Unknown time  . butalbital-acetaminophen-caffeine (FIORICET, ESGIC) 50-325-40 MG tablet Take 1-2 tablets by  mouth every 6 (six) hours as needed for headache. 20 tablet 0 04/15/2018 at Unknown time  . cyclobenzaprine (FLEXERIL) 10 MG tablet Take 0.5-1 tablets (5-10 mg total) by mouth at bedtime as needed for muscle spasms. 10 tablet 0 04/15/2018 at Unknown time  . Elastic Bandages & Supports (COMFORT FIT MATERNITY SUPP LG) MISC 1 Units by Does not apply route daily as needed. 1 each 0 04/14/2018 at Unknown time  . ferrous sulfate (SLOW IRON) 160 (50 Fe) MG TBCR SR tablet Take 1 tablet (160 mg total) by mouth every 3 (three) days. 30 each 0 Past Week at Unknown time  . Prenatal Vit-Fe Fumarate-FA (PRENATAL MULTIVITAMIN) TABS tablet Take 1 tablet by mouth daily at 12 noon.   04/14/2018 at Unknown time  . Vitamin D, Ergocalciferol, (DRISDOL) 50000 units CAPS capsule Take 1 capsule (50,000 Units total) by mouth every 7 (seven) days. 30 capsule 2 More than a month at Unknown time    I have reviewed patient's Past  Medical Hx, Surgical Hx, Family Hx, Social Hx, medications and allergies.   ROS:  Review of Systems  Constitutional: Negative for chills and fever.  Gastrointestinal: Negative for abdominal pain.  Genitourinary: Negative for dysuria, flank pain, frequency, hematuria, pelvic pain and vaginal bleeding.  Musculoskeletal: Positive for back pain. Negative for gait problem, myalgias and neck pain.  Neurological: Negative for weakness, numbness and headaches.    Physical Exam   Patient Vitals for the past 24 hrs:  BP Temp Temp src Pulse Resp Weight  04/15/18 1348 123/77 99.1 F (37.3 C) Oral 96 18 -  04/15/18 1325 - - - - - 86 kg   Constitutional: Well-developed, well-nourished female in mild distress.  Cardiovascular: normal rate Respiratory: normal effort GI: Abd soft, non-tender, gravid appropriate for gestational age.  MS: Extremities nontender, no edema, normal ROM. Back NT.  Neurologic: Alert and oriented x 4. Grossly Nml sensation of legs and back. GU: Neg CVAT.   FHT:  Baseline 150 , moderate variability, accelerations present, no decelerations Contractions: None   Labs: Results for orders placed or performed during the hospital encounter of 04/15/18 (from the past 24 hour(s))  Urinalysis, Routine w reflex microscopic     Status: Abnormal   Collection Time: 04/15/18  2:01 PM  Result Value Ref Range   Color, Urine YELLOW YELLOW   APPearance HAZY (A) CLEAR   Specific Gravity, Urine 1.016 1.005 - 1.030   pH 6.0 5.0 - 8.0   Glucose, UA 150 (A) NEGATIVE mg/dL   Hgb urine dipstick NEGATIVE NEGATIVE   Bilirubin Urine NEGATIVE NEGATIVE   Ketones, ur NEGATIVE NEGATIVE mg/dL   Protein, ur NEGATIVE NEGATIVE mg/dL   Nitrite NEGATIVE NEGATIVE   Leukocytes, UA NEGATIVE NEGATIVE    Imaging:  NA  MAU Course: Orders Placed This Encounter  Procedures  . Urinalysis, Routine w reflex microscopic  . Apply ice  . Discharge patient   Meds ordered this encounter  Medications  .  gabapentin (NEURONTIN) capsule 300 mg  . gabapentin (NEURONTIN) 300 MG capsule    Sig: Take 1 capsule (300 mg total) by mouth 2 (two) times daily.    Dispense:  20 capsule    Refill:  0    Order Specific Question:   Supervising Provider    Answer:   Duane Lope H [[id number]]   Pain decreased significantly to 4/10. Pt reports feeling much better. Will Rx Gabapentin to be used sparingly. Encouraged ice, continuing maternity support belt. Will refer to Sr.  Stinson, DO for possible adjustement  MDM: - Sciatica vs Musculoskeletal LBP exacerbated by postural changes of pregnancy. No numbness or weakness concerning for emergent neurological condition. Pain controlled w/ ice and Gabapentin.   Assessment: 1. Back pain affecting pregnancy in second trimester     Plan: Discharge home in stable condition.  Preterm Labor precautions and fetal kick counts Comfort measures. Follow-up Information    [facility] Follow up.   Why:  As scheduled or sooner as needed if symptoms worsen Contact information: [address] [place] [address] [phone]       [facility] Follow up.   Specialty:  Obstetrics and Gynecology Why:  For appointment with Dr. Adrian Blackwater to treat back pain Contact information: [address] [place] [address] [phone]       [facility] [place] MATERNITY ADMISSIONS Follow up.   Why:  As needed in pregnancy emergencies Contact information: [address] [id number] mc [place] [address] [phone]          Allergies as of 04/15/2018   No Known Allergies     Medication List    TAKE these medications   aspirin EC 81 MG tablet Take 1 tablet (81 mg total) by mouth daily. Take after 12 weeks for prevention of preeclampsia later in pregnancy   butalbital-acetaminophen-caffeine 50-325-40 MG tablet Commonly  known as:  FIORICET, ESGIC Take 1-2 tablets by mouth every 6 (six) hours as needed for headache.   COMFORT FIT MATERNITY SUPP LG Misc 1 Units by Does not apply route daily as needed.   cyclobenzaprine 10 MG tablet Commonly known as:  FLEXERIL Take 0.5-1 tablets (5-10 mg total) by mouth at bedtime as needed for muscle spasms.   ferrous sulfate 160 (50 Fe) MG Tbcr SR tablet Commonly known as:  SLOW FE Take 1 tablet (160 mg total) by mouth every 3 (three) days.   gabapentin 300 MG capsule Commonly known as:  NEURONTIN Take 1 capsule (300 mg total) by mouth 2 (two) times daily.   prenatal multivitamin Tabs tablet Take 1 tablet by mouth daily at 12 noon.     ASK your doctor about these medications   Vitamin D (Ergocalciferol) 50000 units Caps capsule Commonly known as:  DRISDOL Take 1 capsule (50,000 Units total) by mouth every 7 (seven) days.       Katrinka Blazing, [place], CNM 04/15/2018 4:22 PM

## 2018-04-19 ENCOUNTER — Ambulatory Visit (INDEPENDENT_AMBULATORY_CARE_PROVIDER_SITE_OTHER): Payer: Medicaid Other | Admitting: *Deleted

## 2018-04-19 ENCOUNTER — Encounter: Payer: Self-pay | Admitting: Family Medicine

## 2018-04-19 VITALS — BP 131/86 | HR 94 | Wt 184.0 lb

## 2018-04-19 DIAGNOSIS — O0990 Supervision of high risk pregnancy, unspecified, unspecified trimester: Secondary | ICD-10-CM

## 2018-04-19 DIAGNOSIS — O09219 Supervision of pregnancy with history of pre-term labor, unspecified trimester: Principal | ICD-10-CM

## 2018-04-19 DIAGNOSIS — O09212 Supervision of pregnancy with history of pre-term labor, second trimester: Secondary | ICD-10-CM | POA: Diagnosis not present

## 2018-04-19 DIAGNOSIS — O09899 Supervision of other high risk pregnancies, unspecified trimester: Secondary | ICD-10-CM

## 2018-04-19 NOTE — Progress Notes (Signed)
Pt is in office for 17p injection today. Pt states that she is doing well and has no other concerns today. Pt tolerated injection well.  BP 131/86   Pulse 94   Wt 184 lb (83.5 kg)   LMP 09/25/2017   BMI 30.62 kg/m   Administrations This Visit    HYDROXYprogesterone Caproate SOAJ 275 mg    Admin Date 04/19/2018 Action Given Dose 275 mg Route Subcutaneous Administered By Lanney Gins, CMA

## 2018-04-23 ENCOUNTER — Encounter: Payer: Self-pay | Admitting: Family Medicine

## 2018-04-26 ENCOUNTER — Encounter: Payer: Self-pay | Admitting: Obstetrics and Gynecology

## 2018-04-26 ENCOUNTER — Ambulatory Visit (INDEPENDENT_AMBULATORY_CARE_PROVIDER_SITE_OTHER): Payer: Medicaid Other | Admitting: Obstetrics and Gynecology

## 2018-04-26 ENCOUNTER — Other Ambulatory Visit: Payer: Medicaid Other

## 2018-04-26 VITALS — BP 141/95 | HR 90 | Wt 185.5 lb

## 2018-04-26 DIAGNOSIS — Z23 Encounter for immunization: Secondary | ICD-10-CM

## 2018-04-26 DIAGNOSIS — O09899 Supervision of other high risk pregnancies, unspecified trimester: Secondary | ICD-10-CM

## 2018-04-26 DIAGNOSIS — O09219 Supervision of pregnancy with history of pre-term labor, unspecified trimester: Principal | ICD-10-CM

## 2018-04-26 DIAGNOSIS — O09213 Supervision of pregnancy with history of pre-term labor, third trimester: Secondary | ICD-10-CM

## 2018-04-26 DIAGNOSIS — O09293 Supervision of pregnancy with other poor reproductive or obstetric history, third trimester: Secondary | ICD-10-CM

## 2018-04-26 DIAGNOSIS — O0990 Supervision of high risk pregnancy, unspecified, unspecified trimester: Secondary | ICD-10-CM

## 2018-04-26 DIAGNOSIS — O10913 Unspecified pre-existing hypertension complicating pregnancy, third trimester: Secondary | ICD-10-CM

## 2018-04-26 DIAGNOSIS — O10919 Unspecified pre-existing hypertension complicating pregnancy, unspecified trimester: Secondary | ICD-10-CM

## 2018-04-26 DIAGNOSIS — O0993 Supervision of high risk pregnancy, unspecified, third trimester: Secondary | ICD-10-CM

## 2018-04-26 DIAGNOSIS — O09299 Supervision of pregnancy with other poor reproductive or obstetric history, unspecified trimester: Secondary | ICD-10-CM

## 2018-04-26 DIAGNOSIS — Z9889 Other specified postprocedural states: Secondary | ICD-10-CM

## 2018-04-26 MED ORDER — HYDROXYPROGESTERONE CAPROATE 275 MG/1.1ML ~~LOC~~ SOAJ
275.0000 mg | Freq: Once | SUBCUTANEOUS | Status: AC
  Administered 2018-04-26: 275 mg via SUBCUTANEOUS

## 2018-04-26 NOTE — Progress Notes (Signed)
Pt presents for ROB w/ 2hr GTT today.  17 p given  T-dap given    c/o: headaches takes tylenol no relief. Denies any swelling or visual changes.

## 2018-04-26 NOTE — Progress Notes (Signed)
   PRENATAL VISIT NOTE  Subjective:  Brittany [name] is a 37 y.o. [phone] at [redacted]w[redacted]d being seen today for ongoing prenatal care.  She is currently monitored for the following issues for this high-risk pregnancy and has HTN in pregnancy, chronic; Subclinical hyperthyroidism; Migraine; Murmur, cardiac; Anemia; Supervision of high risk pregnancy, antepartum; Vitamin D deficiency; H/O LEEP; H/O intrauterine growth restriction in prior pregnancy, currently pregnant; H/O pre-eclampsia in prior pregnancy, currently pregnant; H/O preterm delivery, currently pregnant; Headache in pregnancy, antepartum; Depression affecting pregnancy; and Preterm contractions on their problem list.  Patient reports lower back cramping that is painful, also with cramping in uterus. Has also had headaches intermittently, took tylenol which helped. Has been having more headaches, history of headaches in pregnancy, but feels okay now.   Contractions: Not present. Vag. Bleeding: None.  Movement: Present. Denies leaking of fluid.   The following portions of the patient's history were reviewed and updated as appropriate: allergies, current medications, past family history, past medical history, past social history, past surgical history and problem list. Problem list updated.  Objective:   Vitals:   04/26/18 0841  BP: (!) 141/95  Pulse: 90  Weight: 185 lb 8 oz (84.1 kg)    Fetal Status: Fetal Heart Rate (bpm): 158   Movement: Present     General:  Alert, oriented and cooperative. Patient is in no acute distress.  Skin: Skin is warm and dry. No rash noted.   Cardiovascular: Normal heart rate noted  Respiratory: Normal respiratory effort, no problems with respiration noted  Abdomen: Soft, gravid, appropriate for gestational age.  Pain/Pressure: Present     Pelvic: Cervical exam deferred        Extremities: Normal range of motion.  Edema: None  Mental Status: Normal mood and affect. Normal behavior. Normal judgment and  thought content.   Assessment and Plan:  Pregnancy: [id number] at [redacted]w[redacted]d  1. Supervision of high risk pregnancy, antepartum - Glucose Tolerance, 2 Hours w/1 Hour - CBC - HIV Antibody (routine testing w rflx) - RPR - Tdap vaccine greater than or equal to 7yo IM  2. H/O preterm delivery, currently pregnant - HYDROXYprogesterone Caproate SOAJ 275 mg  3. H/O intrauterine growth restriction in prior pregnancy, currently pregnant  4. H/O pre-eclampsia in prior pregnancy, currently pregnant Cont baby Aspirin  5. HTN in pregnancy, chronic BP mildly elevated today with intermittent headache CMP, UPCr  6. H/O LEEP  7. Back pain - reviewed strategies for improvement - to hospital with labor/contractions   Preterm labor symptoms and general obstetric precautions including but not limited to vaginal bleeding, contractions, leaking of fluid and fetal movement were reviewed in detail with the patient. Please refer to After Visit Summary for other counseling recommendations.  Return in about 2 weeks (around 05/10/2018) for OB visit (MD).  Future Appointments  Date Time Provider Department Center  05/03/2018 10:30 AM [facility] NURSE [facility] None  05/08/2018  9:00 AM [facility] [place] [facility]  10/08/2018 10:00 AM Evaristo Bury, NP [facility]    Conan Bowens, MD

## 2018-04-27 ENCOUNTER — Encounter: Payer: Self-pay | Admitting: Obstetrics and Gynecology

## 2018-04-27 DIAGNOSIS — O1210 Gestational proteinuria, unspecified trimester: Secondary | ICD-10-CM | POA: Insufficient documentation

## 2018-04-27 LAB — RPR: RPR Ser Ql: NONREACTIVE

## 2018-04-27 LAB — COMPREHENSIVE METABOLIC PANEL
ALBUMIN: 3.6 g/dL (ref 3.5–5.5)
ALT: 20 IU/L (ref 0–32)
AST: 20 IU/L (ref 0–40)
Albumin/Globulin Ratio: 1.2 (ref 1.2–2.2)
Alkaline Phosphatase: 73 IU/L (ref 39–117)
BUN / CREAT RATIO: 10 (ref 9–23)
BUN: 5 mg/dL — ABNORMAL LOW (ref 6–20)
Bilirubin Total: 0.2 mg/dL (ref 0.0–1.2)
CALCIUM: 8.7 mg/dL (ref 8.7–10.2)
CO2: 20 mmol/L (ref 20–29)
CREATININE: 0.5 mg/dL — AB (ref 0.57–1.00)
Chloride: 100 mmol/L (ref 96–106)
GFR calc Af Amer: 145 mL/min/{1.73_m2} (ref >59)
GFR, EST NON AFRICAN AMERICAN: 126 mL/min/{1.73_m2} (ref >59)
GLOBULIN, TOTAL: 2.9 g/dL (ref 1.5–4.5)
Glucose: 96 mg/dL (ref 65–99)
Potassium: 3.8 mmol/L (ref 3.5–5.2)
SODIUM: 136 mmol/L (ref 134–144)
Total Protein: 6.5 g/dL (ref 6.0–8.5)

## 2018-04-27 LAB — PROTEIN / CREATININE RATIO, URINE
Creatinine, Urine: 75.5 mg/dL
Protein, Ur: 37.7 mg/dL
Protein/Creat Ratio: 499 mg/g creat — ABNORMAL HIGH (ref 0–200)

## 2018-04-27 LAB — CBC
Hematocrit: 35.5 % (ref 34.0–46.6)
Hemoglobin: 11.6 g/dL (ref 11.1–15.9)
MCH: 28.5 pg (ref 26.6–33.0)
MCHC: 32.7 g/dL (ref 31.5–35.7)
MCV: 87 fL (ref 79–97)
PLATELETS: 200 10*3/uL (ref 150–450)
RBC: 4.07 x10E6/uL (ref 3.77–5.28)
RDW: 13.6 % (ref 12.3–15.4)
WBC: 11.1 10*3/uL — ABNORMAL HIGH (ref 3.4–10.8)

## 2018-04-27 LAB — GLUCOSE TOLERANCE, 2 HOURS W/ 1HR
GLUCOSE, 1 HOUR: 140 mg/dL (ref 65–179)
Glucose, 2 hour: 101 mg/dL (ref 65–152)
Glucose, Fasting: 71 mg/dL (ref 65–91)

## 2018-04-27 LAB — HIV ANTIBODY (ROUTINE TESTING W REFLEX): HIV Screen 4th Generation wRfx: NONREACTIVE

## 2018-04-29 ENCOUNTER — Other Ambulatory Visit: Payer: Self-pay

## 2018-04-29 ENCOUNTER — Encounter (HOSPITAL_COMMUNITY): Payer: Self-pay

## 2018-04-29 ENCOUNTER — Inpatient Hospital Stay (HOSPITAL_BASED_OUTPATIENT_CLINIC_OR_DEPARTMENT_OTHER): Payer: Medicaid Other

## 2018-04-29 ENCOUNTER — Inpatient Hospital Stay (HOSPITAL_COMMUNITY)
Admission: AD | Admit: 2018-04-29 | Discharge: 2018-04-29 | Disposition: A | Discharge status: Home / Self Care | Payer: Medicaid Other | Source: Ambulatory Visit | Attending: Obstetrics and Gynecology | Admitting: Obstetrics and Gynecology

## 2018-04-29 DIAGNOSIS — R519 Headache, unspecified: Secondary | ICD-10-CM

## 2018-04-29 DIAGNOSIS — Z3A28 28 weeks gestation of pregnancy: Secondary | ICD-10-CM | POA: Diagnosis not present

## 2018-04-29 DIAGNOSIS — O113 Pre-existing hypertension with pre-eclampsia, third trimester: Secondary | ICD-10-CM | POA: Insufficient documentation

## 2018-04-29 DIAGNOSIS — O1403 Mild to moderate pre-eclampsia, third trimester: Secondary | ICD-10-CM | POA: Diagnosis not present

## 2018-04-29 DIAGNOSIS — O1493 Unspecified pre-eclampsia, third trimester: Secondary | ICD-10-CM | POA: Diagnosis not present

## 2018-04-29 DIAGNOSIS — O10013 Pre-existing essential hypertension complicating pregnancy, third trimester: Secondary | ICD-10-CM | POA: Diagnosis not present

## 2018-04-29 DIAGNOSIS — G8929 Other chronic pain: Secondary | ICD-10-CM

## 2018-04-29 DIAGNOSIS — R51 Headache: Secondary | ICD-10-CM | POA: Diagnosis present

## 2018-04-29 LAB — URINALYSIS, ROUTINE W REFLEX MICROSCOPIC
Bilirubin Urine: NEGATIVE
Glucose, UA: 50 mg/dL — AB
Hgb urine dipstick: NEGATIVE
KETONES UR: NEGATIVE mg/dL
LEUKOCYTES UA: NEGATIVE
NITRITE: NEGATIVE
PH: 6 (ref 5.0–8.0)
Protein, ur: NEGATIVE mg/dL
Specific Gravity, Urine: 1.011 (ref 1.005–1.030)

## 2018-04-29 LAB — COMPREHENSIVE METABOLIC PANEL
ALBUMIN: 3 g/dL — AB (ref 3.5–5.0)
ALT: 21 U/L (ref 0–44)
AST: 21 U/L (ref 15–41)
Alkaline Phosphatase: 65 U/L (ref 38–126)
Anion gap: 8 (ref 5–15)
BUN: 6 mg/dL (ref 6–20)
CHLORIDE: 105 mmol/L (ref 98–111)
CO2: 22 mmol/L (ref 22–32)
CREATININE: 0.52 mg/dL (ref 0.44–1.00)
Calcium: 8.8 mg/dL — ABNORMAL LOW (ref 8.9–10.3)
GFR calc Af Amer: >60 mL/min (ref >60)
GFR calc non Af Amer: >60 mL/min (ref >60)
Glucose, Bld: 89 mg/dL (ref 70–99)
Potassium: 3.7 mmol/L (ref 3.5–5.1)
Sodium: 135 mmol/L (ref 135–145)
Total Bilirubin: 0.2 mg/dL — ABNORMAL LOW (ref 0.3–1.2)
Total Protein: 7.1 g/dL (ref 6.5–8.1)

## 2018-04-29 LAB — CBC
HCT: 34.8 % — ABNORMAL LOW (ref 36.0–46.0)
Hemoglobin: 11.7 g/dL — ABNORMAL LOW (ref 12.0–15.0)
MCH: 28.7 pg (ref 26.0–34.0)
MCHC: 33.6 g/dL (ref 30.0–36.0)
MCV: 85.5 fL (ref 80.0–100.0)
NRBC: 0 % (ref 0.0–0.2)
PLATELETS: 196 10*3/uL (ref 150–400)
RBC: 4.07 MIL/uL (ref 3.87–5.11)
RDW: 14.3 % (ref 11.5–15.5)
WBC: 11.1 10*3/uL — ABNORMAL HIGH (ref 4.0–10.5)

## 2018-04-29 MED ORDER — LACTATED RINGERS IV BOLUS
1000.0000 mL | Freq: Once | INTRAVENOUS | Status: AC
  Administered 2018-04-29: 1000 mL via INTRAVENOUS

## 2018-04-29 MED ORDER — GABAPENTIN 300 MG PO CAPS: 300.0000 mg | ORAL_CAPSULE | Freq: Three times a day (TID) | ORAL | 1 refills | Status: DC | PRN

## 2018-04-29 MED ORDER — DEXAMETHASONE SODIUM PHOSPHATE 10 MG/ML IJ SOLN
10.0000 mg | Freq: Once | INTRAMUSCULAR | Status: AC
  Administered 2018-04-29: 10 mg via INTRAVENOUS
  Filled 2018-04-29: qty 1

## 2018-04-29 MED ORDER — PROCHLORPERAZINE EDISYLATE 10 MG/2ML IJ SOLN
10.0000 mg | Freq: Once | INTRAMUSCULAR | Status: AC
  Administered 2018-04-29: 10 mg via INTRAVENOUS
  Filled 2018-04-29: qty 2

## 2018-04-29 MED ORDER — GABAPENTIN 300 MG PO CAPS
300.0000 mg | ORAL_CAPSULE | Freq: Once | ORAL | Status: AC
  Administered 2018-04-29: 300 mg via ORAL
  Filled 2018-04-29: qty 1

## 2018-04-29 MED ORDER — DIPHENHYDRAMINE HCL 50 MG/ML IJ SOLN
25.0000 mg | Freq: Once | INTRAMUSCULAR | Status: AC
  Administered 2018-04-29: 25 mg via INTRAVENOUS
  Filled 2018-04-29: qty 1

## 2018-04-29 NOTE — Discharge Instructions (Signed)
Preeclampsia and Eclampsia °Preeclampsia is a serious condition that develops only during pregnancy. It is also called toxemia of pregnancy. This condition causes high blood pressure along with other symptoms, such as swelling and headaches. These symptoms may develop as the condition gets worse. Preeclampsia may occur at 20 weeks of pregnancy or later. °Diagnosing and treating preeclampsia early is very important. If not treated early, it can cause serious problems for you and your baby. One problem it can lead to is eclampsia, which is a condition that causes muscle jerking or shaking (convulsions or seizures) in the mother. Delivering your baby is the best treatment for preeclampsia or eclampsia. Preeclampsia and eclampsia symptoms usually go away after your baby is born. °What are the causes? °The cause of preeclampsia is not known. °What increases the risk? °The following risk factors make you more likely to develop preeclampsia: °· Being pregnant for the first time. °· Having had preeclampsia during a past pregnancy. °· Having a family history of preeclampsia. °· Having high blood pressure. °· Being pregnant with twins or triplets. °· Being 35 or older. °· Being African-American. °· Having kidney disease or diabetes. °· Having medical conditions such as lupus or blood diseases. °· Being very overweight (obese). ° °What are the signs or symptoms? °The earliest signs of preeclampsia are: °· High blood pressure. °· Increased protein in your urine. Your health care provider will check for this at every visit before you give birth (prenatal visit). ° °Other symptoms that may develop as the condition gets worse include: °· Severe headaches. °· Sudden weight gain. °· Swelling of the hands, face, legs, and feet. °· Nausea and vomiting. °· Vision problems, such as blurred or double vision. °· Numbness in the face, arms, legs, and feet. °· Urinating less than usual. °· Dizziness. °· Slurred speech. °· Abdominal pain,  especially upper abdominal pain. °· Convulsions or seizures. ° °Symptoms generally go away after giving birth. °How is this diagnosed? °There are no screening tests for preeclampsia. Your health care provider will ask you about symptoms and check for signs of preeclampsia during your prenatal visits. You may also have tests that include: °· Urine tests. °· Blood tests. °· Checking your blood pressure. °· Monitoring your baby’s heart rate. °· Ultrasound. ° °How is this treated? °You and your health care provider will determine the treatment approach that is best for you. Treatment may include: °· Having more frequent prenatal exams to check for signs of preeclampsia, if you have an increased risk for preeclampsia. °· Bed rest. °· Reducing how much salt (sodium) you eat. °· Medicine to lower your blood pressure. °· Staying in the hospital, if your condition is severe. There, treatment will focus on controlling your blood pressure and the amount of fluids in your body (fluid retention). °· You may need to take medicine (magnesium sulfate) to prevent seizures. This medicine may be given as an injection or through an IV tube. °· Delivering your baby early, if your condition gets worse. You may have your labor started with medicine (induced), or you may have a cesarean delivery. ° °Follow these instructions at home: °Eating and drinking ° °· Drink enough fluid to keep your urine clear or pale yellow. °· Eat a healthy diet that is low in sodium. Do not add salt to your food. Check nutrition labels to see how much sodium a food or beverage contains. °· Avoid caffeine. °Lifestyle °· Do not use any products that contain nicotine or tobacco, such as cigarettes   and e-cigarettes. If you need help quitting, ask your health care provider. °· Do not use alcohol or drugs. °· Avoid stress as much as possible. Rest and get plenty of sleep. °General instructions °· Take over-the-counter and prescription medicines only as told by your  health care provider. °· When lying down, lie on your side. This keeps pressure off of your baby. °· When sitting or lying down, raise (elevate) your feet. Try putting some pillows underneath your lower legs. °· Exercise regularly. Ask your health care provider what kinds of exercise are best for you. °· Keep all follow-up and prenatal visits as told by your health care provider. This is important. °How is this prevented? °To prevent preeclampsia or eclampsia from developing during another pregnancy: °· Get proper medical care during pregnancy. Your health care provider may be able to prevent preeclampsia or diagnose and treat it early. °· Your health care provider may have you take a low-dose aspirin or a calcium supplement during your next pregnancy. °· You may have tests of your blood pressure and kidney function after giving birth. °· Maintain a healthy weight. Ask your health care provider for help managing weight gain during pregnancy. °· Work with your health care provider to manage any long-term (chronic) health conditions you have, such as diabetes or kidney problems. ° °Contact a health care provider if: °· You gain more weight than expected. °· You have headaches. °· You have nausea or vomiting. °· You have abdominal pain. °· You feel dizzy or light-headed. °Get help right away if: °· You develop sudden or severe swelling anywhere in your body. This usually happens in the legs. °· You gain 5 lbs (2.3 kg) or more during one week. °· You have severe: °? Abdominal pain. °? Headaches. °? Dizziness. °? Vision problems. °? Confusion. °? Nausea or vomiting. °· You have a seizure. °· You have trouble moving any part of your body. °· You develop numbness in any part of your body. °· You have trouble speaking. °· You have any abnormal bleeding. °· You pass out. °This information is not intended to replace advice given to you by your health care provider. Make sure you discuss any questions you have with your health  care provider. °Document Released: 07/01/2000 Document Revised: 03/01/2016 Document Reviewed: 02/08/2016 °Elsevier Interactive Patient Education © 2018 [employer] ° °

## 2018-04-29 NOTE — MAU Note (Signed)
Do not repeat bp per CNM

## 2018-04-29 NOTE — MAU Provider Note (Signed)
Chief Complaint  Patient presents with  . Headache  . Back Pain     First Provider Initiated Contact with Patient 04/29/18 1441      S: Brittany [name]  is a 37 y.o. y.o. year old [phone] female at [redacted]w[redacted]d weeks gestation who presents to MAU with headsche that did not resolve w/ Tylenol. Pos Hx CHTN. Current blood pressure medication: None. Had Pre-E labs 04/26/18. P:C was elevated. She had A Nml P:C earlier this pregnancy so she already meets criteria for Pre-E w/out severe features. Results had not yet resulted and-of-day Friday so the pt did not know about the Dx.   Reports having headaches ~2-3 x per week since the beginning of this pregnancy. Has never found anything that makes them go away. They usually go away after a day or two on their own. Has tried Tylenol, Fioricet, Flexeril w/out improvement. Only tried Tylenol today. Came to MAU today for HA because she called the after-hours nurse line, told them about her HA and Hx CHTN and they recommended she go to MAU. Rates pain 8/10, frontal pounding.    Associated symptoms: Pos Headache and photophobia. Neg for vision changes, epigastric pain, neck pain, fever, chills, difficulties w/ speech or gait, N/V, dizziness or weakness.  Contractions: Denies Vaginal bleeding: Denies Fetal movement: Nml  O:  Patient Vitals for the past 24 hrs:  BP Temp Temp src Pulse Resp Weight  04/29/18 1329 124/78 98.5 F (36.9 C) Oral 93 16 84.3 kg   General: Mild distress, more so w/ light on.  Heart: Regular rate Lungs: Normal rate and effort Abd: Soft, NT, Gravid, S=D Extremities: Tr Pedal edema Neuro: 2+ deep tendon reflexes, No clonus Pelvic: NEFG, no bleeding or LOF.      EFM: 140, Moderate variability, 15 x 15 accelerations, no decelerations Toco: None  Results for orders placed or performed during the hospital encounter of 04/29/18 (from the past 24 hour(s))  Urinalysis, Routine w reflex microscopic     Status: Abnormal   Collection Time:  04/29/18  1:34 PM  Result Value Ref Range   Color, Urine YELLOW YELLOW   APPearance CLEAR CLEAR   Specific Gravity, Urine 1.011 1.005 - 1.030   pH 6.0 5.0 - 8.0   Glucose, UA 50 (A) NEGATIVE mg/dL   Hgb urine dipstick NEGATIVE NEGATIVE   Bilirubin Urine NEGATIVE NEGATIVE   Ketones, ur NEGATIVE NEGATIVE mg/dL   Protein, ur NEGATIVE NEGATIVE mg/dL   Nitrite NEGATIVE NEGATIVE   Leukocytes, UA NEGATIVE NEGATIVE  CBC     Status: Abnormal   Collection Time: 04/29/18  2:47 PM  Result Value Ref Range   WBC 11.1 (H) 4.0 - 10.5 K/uL   RBC 4.07 3.87 - 5.11 MIL/uL   Hemoglobin 11.7 (L) 12.0 - 15.0 g/dL   HCT 11.9 (L) 14.7 - 82.9 %   MCV 85.5 80.0 - 100.0 fL   MCH 28.7 26.0 - 34.0 pg   MCHC 33.6 30.0 - 36.0 g/dL   RDW 56.2 13.0 - 86.5 %   Platelets 196 150 - 400 K/uL   nRBC 0.0 0.0 - 0.2 %  Comprehensive metabolic panel     Status: Abnormal   Collection Time: 04/29/18  2:47 PM  Result Value Ref Range   Sodium 135 135 - 145 mmol/L   Potassium 3.7 3.5 - 5.1 mmol/L   Chloride 105 98 - 111 mmol/L   CO2 22 22 - 32 mmol/L   Glucose, Bld 89 70 - 99 mg/dL  BUN 6 6 - 20 mg/dL   Creatinine, Ser 1.61 0.44 - 1.00 mg/dL   Calcium 8.8 (L) 8.9 - 10.3 mg/dL   Total Protein 7.1 6.5 - 8.1 g/dL   Albumin 3.0 (L) 3.5 - 5.0 g/dL   AST 21 15 - 41 U/L   ALT 21 0 - 44 U/L   Alkaline Phosphatase 65 38 - 126 U/L   Total Bilirubin 0.2 (L) 0.3 - 1.2 mg/dL   GFR calc non Af Amer >60 >60 mL/min   GFR calc Af Amer >60 >60 mL/min   Anion gap 8 5 - 15   Discussed Hx, labs, exam w/ Dr. Earlene Plater. Agrees w/ POC. New orders: BPP.   BPP 10/10  A: [redacted]w[redacted]d week IUP Chronic hypertension with superimposed preeclampsia w/out severe features. Chronic HA that resolved w/ HA Cocktail. Not C/W Pre-E HA. Will try Gabapentin for HA's. May need referral to [facility].  Pre-E precautions.  FHR reactive  P: Discharge home per consult with Conan Bowens, MD. Preeclampsia precautions. Follow-up for ROB/blood pressure  check/17-P on 05/03/18 at your doctor's office sooner as needed if symptoms worsen. Return to maternity admissions as needed in emergencies  [place], [place], [place] 04/29/2018 6:44 PM

## 2018-04-29 NOTE — MAU Note (Signed)
Headache since this morning, no relief with tylenol  Lower back pain ongoing in pregnancy but worse today  Had chest pain this AM but none right now  No blurry vision  No vaginal bleeding, no discharge, +FM

## 2018-04-30 ENCOUNTER — Telehealth: Payer: Self-pay

## 2018-04-30 ENCOUNTER — Other Ambulatory Visit: Payer: Self-pay | Admitting: Obstetrics

## 2018-04-30 DIAGNOSIS — R112 Nausea with vomiting, unspecified: Secondary | ICD-10-CM

## 2018-04-30 MED ORDER — DOXYLAMINE-PYRIDOXINE 10-10 MG PO TBEC: DELAYED_RELEASE_TABLET | ORAL | 5 refills | Status: DC

## 2018-04-30 NOTE — Telephone Encounter (Signed)
TC from pt to report Nausea and Vomiting requesting Diglesic Rx  Please advise.

## 2018-04-30 NOTE — Telephone Encounter (Signed)
Diclegis Rx 

## 2018-05-03 ENCOUNTER — Encounter: Payer: Self-pay | Admitting: Obstetrics and Gynecology

## 2018-05-03 ENCOUNTER — Ambulatory Visit (INDEPENDENT_AMBULATORY_CARE_PROVIDER_SITE_OTHER): Payer: Medicaid Other | Admitting: Obstetrics and Gynecology

## 2018-05-03 ENCOUNTER — Ambulatory Visit: Payer: Medicaid Other

## 2018-05-03 VITALS — BP 137/87 | HR 94 | Wt 189.8 lb

## 2018-05-03 DIAGNOSIS — O09299 Supervision of pregnancy with other poor reproductive or obstetric history, unspecified trimester: Secondary | ICD-10-CM

## 2018-05-03 DIAGNOSIS — O119 Pre-existing hypertension with pre-eclampsia, unspecified trimester: Secondary | ICD-10-CM

## 2018-05-03 DIAGNOSIS — O0990 Supervision of high risk pregnancy, unspecified, unspecified trimester: Secondary | ICD-10-CM

## 2018-05-03 DIAGNOSIS — O09899 Supervision of other high risk pregnancies, unspecified trimester: Secondary | ICD-10-CM

## 2018-05-03 DIAGNOSIS — O1213 Gestational proteinuria, third trimester: Secondary | ICD-10-CM

## 2018-05-03 DIAGNOSIS — Z9889 Other specified postprocedural states: Secondary | ICD-10-CM

## 2018-05-03 DIAGNOSIS — O09219 Supervision of pregnancy with history of pre-term labor, unspecified trimester: Secondary | ICD-10-CM

## 2018-05-03 NOTE — Progress Notes (Signed)
Patient reports good fetal movement with pressure that comes and goes, denies contractions and bleeding. Administered 17-p and pt tolerated well.

## 2018-05-03 NOTE — Progress Notes (Signed)
   PRENATAL VISIT NOTE  Subjective:  Brittany [name] is a 37 y.o. [phone] at [redacted]w[redacted]d being seen today for ongoing prenatal care.  She is currently monitored for the following issues for this high-risk pregnancy and has Chronic hypertension with superimposed preeclampsia; Subclinical hyperthyroidism; Migraine; Murmur, cardiac; Anemia; Supervision of high risk pregnancy, antepartum; Vitamin D deficiency; H/O LEEP; H/O intrauterine growth restriction in prior pregnancy, currently pregnant; H/O pre-eclampsia in prior pregnancy, currently pregnant; H/O preterm delivery, currently pregnant; Headache in pregnancy, antepartum; Depression affecting pregnancy; Preterm contractions; and Proteinuria affecting pregnancy on their problem list.  Patient reports fatigue and lots of vaginal pressure.  Contractions: Not present. Vag. Bleeding: None.  Movement: Present. Denies leaking of fluid.   The following portions of the patient's history were reviewed and updated as appropriate: allergies, current medications, past family history, past medical history, past social history, past surgical history and problem list. Problem list updated.  Objective:   Vitals:   05/03/18 1031  BP: 137/87  Pulse: 94  Weight: 189 lb 12.8 oz (86.1 kg)    Fetal Status: Fetal Heart Rate (bpm): 140   Movement: Present     General:  Alert, oriented and cooperative. Patient is in no acute distress.  Skin: Skin is warm and dry. No rash noted.   Cardiovascular: Normal heart rate noted  Respiratory: Normal respiratory effort, no problems with respiration noted  Abdomen: Soft, gravid, appropriate for gestational age.  Pain/Pressure: Present     Pelvic: Cervical exam deferred        Extremities: Normal range of motion.  Edema: None  Mental Status: Normal mood and affect. Normal behavior. Normal judgment and thought content.   Assessment and Plan:  Pregnancy: [id number] at [redacted]w[redacted]d  1. Supervision of high risk pregnancy, antepartum  2.  Chronic hypertension with superimposed preeclampsia Stable on no meds Will start weekly BPP Headache improved today To return to MAU with any worsening symptoms  3. H/O LEEP  4. H/O intrauterine growth restriction in prior pregnancy, currently pregnant  5. H/O pre-eclampsia in prior pregnancy, currently pregnant Cont baby ASA  6. H/O preterm delivery, currently pregnant Cont 17P  7. Proteinuria affecting pregnancy in third trimester   Preterm labor symptoms and general obstetric precautions including but not limited to vaginal bleeding, contractions, leaking of fluid and fetal movement were reviewed in detail with the patient. Please refer to After Visit Summary for other counseling recommendations.  Return in about 2 weeks (around 05/17/2018) for OB visit (MD).  Future Appointments  Date Time Provider Department Center  05/08/2018  9:00 AM [facility] [place] 3 [facility]  05/10/2018 10:45 AM Anyanwu, Jethro Bastos, MD [facility] None  10/08/2018 10:00 AM Evaristo Bury, NP [facility]    Conan Bowens, MD

## 2018-05-03 NOTE — Addendum Note (Signed)
Addended by: Leroy Libman on: 05/03/2018 11:15 AM   Modules accepted: Orders

## 2018-05-08 ENCOUNTER — Ambulatory Visit (HOSPITAL_COMMUNITY)
Admission: RE | Admit: 2018-05-08 | Discharge: 2018-05-08 | Disposition: A | Discharge status: Home / Self Care | Payer: Medicaid Other | Source: Ambulatory Visit | Attending: Obstetrics and Gynecology | Admitting: Obstetrics and Gynecology

## 2018-05-08 ENCOUNTER — Other Ambulatory Visit (HOSPITAL_COMMUNITY): Payer: Self-pay | Admitting: *Deleted

## 2018-05-08 ENCOUNTER — Encounter (HOSPITAL_COMMUNITY): Payer: Self-pay

## 2018-05-08 DIAGNOSIS — O10919 Unspecified pre-existing hypertension complicating pregnancy, unspecified trimester: Secondary | ICD-10-CM

## 2018-05-08 DIAGNOSIS — Z3A30 30 weeks gestation of pregnancy: Secondary | ICD-10-CM | POA: Diagnosis not present

## 2018-05-08 DIAGNOSIS — O3440 Maternal care for other abnormalities of cervix, unspecified trimester: Secondary | ICD-10-CM | POA: Diagnosis not present

## 2018-05-08 DIAGNOSIS — O10913 Unspecified pre-existing hypertension complicating pregnancy, third trimester: Secondary | ICD-10-CM

## 2018-05-08 DIAGNOSIS — O1493 Unspecified pre-eclampsia, third trimester: Secondary | ICD-10-CM

## 2018-05-08 DIAGNOSIS — O119 Pre-existing hypertension with pre-eclampsia, unspecified trimester: Secondary | ICD-10-CM

## 2018-05-08 DIAGNOSIS — O113 Pre-existing hypertension with pre-eclampsia, third trimester: Secondary | ICD-10-CM | POA: Insufficient documentation

## 2018-05-08 DIAGNOSIS — O10013 Pre-existing essential hypertension complicating pregnancy, third trimester: Secondary | ICD-10-CM

## 2018-05-08 DIAGNOSIS — O09293 Supervision of pregnancy with other poor reproductive or obstetric history, third trimester: Secondary | ICD-10-CM

## 2018-05-10 ENCOUNTER — Ambulatory Visit (INDEPENDENT_AMBULATORY_CARE_PROVIDER_SITE_OTHER): Payer: Medicaid Other | Admitting: *Deleted

## 2018-05-10 ENCOUNTER — Encounter (HOSPITAL_COMMUNITY): Payer: Self-pay | Admitting: *Deleted

## 2018-05-10 ENCOUNTER — Inpatient Hospital Stay (HOSPITAL_COMMUNITY)
Admission: AD | Admit: 2018-05-10 | Discharge: 2018-05-10 | Disposition: A | Discharge status: Home / Self Care | Payer: Medicaid Other | Source: Ambulatory Visit | Attending: Obstetrics and Gynecology | Admitting: Obstetrics and Gynecology

## 2018-05-10 VITALS — BP 159/103 | HR 99 | Wt 189.0 lb

## 2018-05-10 DIAGNOSIS — O10013 Pre-existing essential hypertension complicating pregnancy, third trimester: Secondary | ICD-10-CM | POA: Insufficient documentation

## 2018-05-10 DIAGNOSIS — Z3A30 30 weeks gestation of pregnancy: Secondary | ICD-10-CM | POA: Diagnosis not present

## 2018-05-10 DIAGNOSIS — Z9889 Other specified postprocedural states: Secondary | ICD-10-CM

## 2018-05-10 DIAGNOSIS — O26893 Other specified pregnancy related conditions, third trimester: Secondary | ICD-10-CM | POA: Diagnosis not present

## 2018-05-10 DIAGNOSIS — O09299 Supervision of pregnancy with other poor reproductive or obstetric history, unspecified trimester: Secondary | ICD-10-CM

## 2018-05-10 DIAGNOSIS — O09219 Supervision of pregnancy with history of pre-term labor, unspecified trimester: Secondary | ICD-10-CM

## 2018-05-10 DIAGNOSIS — O10913 Unspecified pre-existing hypertension complicating pregnancy, third trimester: Secondary | ICD-10-CM

## 2018-05-10 DIAGNOSIS — R51 Headache: Secondary | ICD-10-CM | POA: Diagnosis not present

## 2018-05-10 DIAGNOSIS — Z7982 Long term (current) use of aspirin: Secondary | ICD-10-CM | POA: Diagnosis not present

## 2018-05-10 DIAGNOSIS — O479 False labor, unspecified: Secondary | ICD-10-CM | POA: Diagnosis not present

## 2018-05-10 DIAGNOSIS — Z3689 Encounter for other specified antenatal screening: Secondary | ICD-10-CM

## 2018-05-10 DIAGNOSIS — R519 Headache, unspecified: Secondary | ICD-10-CM

## 2018-05-10 DIAGNOSIS — O0990 Supervision of high risk pregnancy, unspecified, unspecified trimester: Secondary | ICD-10-CM

## 2018-05-10 DIAGNOSIS — O119 Pre-existing hypertension with pre-eclampsia, unspecified trimester: Secondary | ICD-10-CM

## 2018-05-10 DIAGNOSIS — O09899 Supervision of other high risk pregnancies, unspecified trimester: Secondary | ICD-10-CM

## 2018-05-10 DIAGNOSIS — O29293 Other central nervous system complications of anesthesia during pregnancy, third trimester: Secondary | ICD-10-CM

## 2018-05-10 DIAGNOSIS — O0993 Supervision of high risk pregnancy, unspecified, third trimester: Secondary | ICD-10-CM | POA: Diagnosis not present

## 2018-05-10 DIAGNOSIS — O4700 False labor before 37 completed weeks of gestation, unspecified trimester: Secondary | ICD-10-CM

## 2018-05-10 DIAGNOSIS — R030 Elevated blood-pressure reading, without diagnosis of hypertension: Secondary | ICD-10-CM | POA: Diagnosis present

## 2018-05-10 LAB — CBC
HCT: 33.8 % — ABNORMAL LOW (ref 36.0–46.0)
HEMOGLOBIN: 11.3 g/dL — AB (ref 12.0–15.0)
MCH: 28.5 pg (ref 26.0–34.0)
MCHC: 33.4 g/dL (ref 30.0–36.0)
MCV: 85.1 fL (ref 80.0–100.0)
Platelets: 206 10*3/uL (ref 150–400)
RBC: 3.97 MIL/uL (ref 3.87–5.11)
RDW: 14.1 % (ref 11.5–15.5)
WBC: 13.4 10*3/uL — AB (ref 4.0–10.5)
nRBC: 0 % (ref 0.0–0.2)

## 2018-05-10 LAB — COMPREHENSIVE METABOLIC PANEL
ALK PHOS: 72 U/L (ref 38–126)
ALT: 20 U/L (ref 0–44)
ANION GAP: 9 (ref 5–15)
AST: 25 U/L (ref 15–41)
Albumin: 3 g/dL — ABNORMAL LOW (ref 3.5–5.0)
BILIRUBIN TOTAL: 0.4 mg/dL (ref 0.3–1.2)
BUN: 8 mg/dL (ref 6–20)
CALCIUM: 8.9 mg/dL (ref 8.9–10.3)
CO2: 21 mmol/L — AB (ref 22–32)
Chloride: 105 mmol/L (ref 98–111)
Creatinine, Ser: 0.46 mg/dL (ref 0.44–1.00)
GFR calc non Af Amer: >60 mL/min (ref >60)
Glucose, Bld: 91 mg/dL (ref 70–99)
Potassium: 3.4 mmol/L — ABNORMAL LOW (ref 3.5–5.1)
SODIUM: 135 mmol/L (ref 135–145)
TOTAL PROTEIN: 6.7 g/dL (ref 6.5–8.1)

## 2018-05-10 LAB — PROTEIN / CREATININE RATIO, URINE
CREATININE, URINE: 98 mg/dL
PROTEIN CREATININE RATIO: 0.11 mg/mg{creat} (ref 0.00–0.15)
TOTAL PROTEIN, URINE: 11 mg/dL

## 2018-05-10 MED ORDER — BUTALBITAL-APAP-CAFFEINE 50-325-40 MG PO TABS
2.0000 | ORAL_TABLET | Freq: Once | ORAL | Status: AC
  Administered 2018-05-10: 2 via ORAL
  Filled 2018-05-10: qty 2

## 2018-05-10 NOTE — Progress Notes (Addendum)
Pt is in office for 17p injection and NST. Pt has elevated BP in office today and complaints of severe HA with no relief after Tylenol. Pt denies any changes in vision, reports good FM. Pt is taking ASA daily but no other BP meds at this time.   Initial BP: 160/97 BP (!) 159/103   Pulse 99   Wt 189 lb (85.7 kg)   LMP 09/25/2017   BMI 31.45 kg/m    Pt did not have NST at today's visit. Reviewed with Dr Macon Large- pt to be sent to MAU after receiving 17p today.  Injection given, pt advised to go straight to [facility] for eval due to increase BP/symptomatic.  Pt states understanding.    Administrations This Visit    HYDROXYprogesterone Caproate SOAJ 275 mg    Admin Date 05/10/2018 Action Given Dose 275 mg Route Subcutaneous Administered By Lanney Gins, CMA

## 2018-05-10 NOTE — MAU Note (Signed)
Pt reports she was sent over from [facility] due to elevated b/p. Headache off/on throughout the pregnancy.

## 2018-05-10 NOTE — MAU Note (Signed)
Urine in lab 

## 2018-05-10 NOTE — Discharge Instructions (Signed)

## 2018-05-10 NOTE — MAU Provider Note (Signed)
History     CSN: [id number]  Arrival date and time: 05/10/18 1102   First Provider Initiated Contact with Patient 05/10/18 1159      Chief Complaint  Patient presents with  . Headache  . Hypertension   [id number] @30 .3 wks sent from office for elevated BP. Reports frontal HA since this am. HA worse with light. Took Tylenol  But had no relief. No visual disturbances, RUQ pain, SOB, or CP. Good FM. No VB, LOF, or ctx. Hx of chronic HAs.  OB History    Gravida  9   Para  3   Term  2   Preterm  1   AB  5   Living  3     SAB  4   TAB  1   Ectopic  0   Multiple  0   Live Births  3           Past Medical History:  Diagnosis Date  . Gallstone   . Headache(784.0)   . Heart murmur   . Hypertension   . Infection    UTI  . Maternal chronic hypertension in first trimester 04/09/2011  . Pregnancy induced hypertension    after 2nd delivery  . Vaginal Pap smear, abnormal    cant remember    Past Surgical History:  Procedure Laterality Date  . DILATION AND CURETTAGE OF UTERUS     dont remember year  . LEEP      Family History  Problem Relation Age of Onset  . Asthma Mother   . Hypertension Mother   . Asthma Maternal Grandmother   . Hypertension Maternal Grandmother     Social History   Tobacco Use  . Smoking status: Never Smoker  . Smokeless tobacco: Never Used  Substance Use Topics  . Alcohol use: No    Alcohol/week: 0.0 standard drinks  . Drug use: No    Allergies: No Known Allergies  Facility-Administered Medications Prior to Admission  Medication Dose Route Frequency Provider Last Rate Last Dose  . HYDROXYprogesterone Caproate SOAJ 275 mg  275 mg Subcutaneous Weekly Hermina Staggers, MD   275 mg at 05/10/18 1126   Medications Prior to Admission  Medication Sig Dispense Refill Last Dose  . aspirin EC 81 MG tablet Take 1 tablet (81 mg total) by mouth daily. Take after 12 weeks for prevention of preeclampsia later in pregnancy 300 tablet 2  Taking  . cyclobenzaprine (FLEXERIL) 10 MG tablet Take 0.5-1 tablets (5-10 mg total) by mouth at bedtime as needed for muscle spasms. (Patient not taking: Reported on 05/08/2018) 10 tablet 0 Not Taking  . Doxylamine-Pyridoxine (DICLEGIS) 10-10 MG TBEC 1 tab in AM, 1 tab mid afternoon 2 tabs at bedtime. Max dose 4 tabs daily. 100 tablet 5 Taking  . Elastic Bandages & Supports (COMFORT FIT MATERNITY SUPP LG) MISC 1 Units by Does not apply route daily as needed. 1 each 0 Taking  . ferrous sulfate (SLOW IRON) 160 (50 Fe) MG TBCR SR tablet Take 1 tablet (160 mg total) by mouth every 3 (three) days. 30 each 0 Taking  . gabapentin (NEURONTIN) 300 MG capsule Take 1 capsule (300 mg total) by mouth 3 (three) times daily as needed. (Patient not taking: Reported on 05/08/2018) 30 capsule 1 Not Taking  . Prenatal Vit-Fe Fumarate-FA (PRENATAL MULTIVITAMIN) TABS tablet Take 1 tablet by mouth daily at 12 noon.   Taking  . Vitamin D, Ergocalciferol, (DRISDOL) 50000 units CAPS capsule Take 1 capsule (50,000 Units  total) by mouth every 7 (seven) days. 30 capsule 2 Taking    Review of Systems  Eyes: Negative for visual disturbance.  Respiratory: Negative for shortness of breath.   Cardiovascular: Negative for chest pain.  Gastrointestinal: Negative for abdominal pain.  Neurological: Positive for headaches.   Physical Exam   Blood pressure 126/87, pulse 92, temperature 98.7 F (37.1 C), temperature source Oral, resp. rate 19, height 5\' 5"  (1.651 m), weight 86.2 kg, last menstrual period 09/25/2017, SpO2 99 %, unknown if currently breastfeeding. Patient Vitals for the past 24 hrs:  BP Temp Temp src Pulse Resp SpO2 Height Weight  05/10/18 1315 122/80 - - 77 - - - -  05/10/18 1300 123/85 - - 80 - - - -  05/10/18 1245 128/77 - - 87 - - - -  05/10/18 1230 125/84 - - 88 - - - -  05/10/18 1215 120/77 - - 93 - - - -  05/10/18 1200 120/79 - - 90 - - - -  05/10/18 1145 123/80 - - 90 - - - -  05/10/18 1141 126/87 - -  92 - - - -  05/10/18 1124 (!) 151/100 98.7 F (37.1 C) Oral 99 19 99 % 5\' 5"  (1.651 m) 86.2 kg   Physical Exam  Nursing note and vitals reviewed. Constitutional: She is oriented to person, place, and time. She appears well-developed and well-nourished. No distress.  HENT:  Head: Normocephalic and atraumatic.  Neck: Normal range of motion.  Cardiovascular: Normal rate.  Respiratory: Effort normal. No respiratory distress.  GI: Soft. She exhibits no distension. There is no tenderness.  gravid  Musculoskeletal: Normal range of motion. She exhibits no edema.  Neurological: She is alert and oriented to person, place, and time.  Skin: Skin is warm and dry.  Psychiatric: She has a normal mood and affect.  EFM: 140 bpm, mod variability, + accels, no decels Toco: rare  Results for orders placed or performed during the hospital encounter of 05/10/18 (from the past 24 hour(s))  Protein / creatinine ratio, urine     Status: None   Collection Time: 05/10/18 11:58 AM  Result Value Ref Range   Creatinine, Urine 98.00 mg/dL   Total Protein, Urine 11 mg/dL   Protein Creatinine Ratio 0.11 0.00 - 0.15 mg/mg[Cre]  CBC     Status: Abnormal   Collection Time: 05/10/18 12:15 PM  Result Value Ref Range   WBC 13.4 (H) 4.0 - 10.5 K/uL   RBC 3.97 3.87 - 5.11 MIL/uL   Hemoglobin 11.3 (L) 12.0 - 15.0 g/dL   HCT 16.1 (L) 09.6 - 04.5 %   MCV 85.1 80.0 - 100.0 fL   MCH 28.5 26.0 - 34.0 pg   MCHC 33.4 30.0 - 36.0 g/dL   RDW 40.9 81.1 - 91.4 %   Platelets 206 150 - 400 K/uL   nRBC 0.0 0.0 - 0.2 %  Comprehensive metabolic panel     Status: Abnormal   Collection Time: 05/10/18 12:15 PM  Result Value Ref Range   Sodium 135 135 - 145 mmol/L   Potassium 3.4 (L) 3.5 - 5.1 mmol/L   Chloride 105 98 - 111 mmol/L   CO2 21 (L) 22 - 32 mmol/L   Glucose, Bld 91 70 - 99 mg/dL   BUN 8 6 - 20 mg/dL   Creatinine, Ser 7.82 0.44 - 1.00 mg/dL   Calcium 8.9 8.9 - 95.6 mg/dL   Total Protein 6.7 6.5 - 8.1 g/dL   Albumin  3.0 (  L) 3.5 - 5.0 g/dL   AST 25 15 - 41 U/L   ALT 20 0 - 44 U/L   Alkaline Phosphatase 72 38 - 126 U/L   Total Bilirubin 0.4 0.3 - 1.2 mg/dL   GFR calc non Af Amer >60 >60 mL/min   GFR calc Af Amer >60 >60 mL/min   Anion gap 9 5 - 15   MAU Course  Procedures Fioricet  MDM Labs ordered and reviewed. One elevated BP, then normotensive. HA almost resolved. No evidence of superimposed PEC. Stable for discharge home.  Assessment and Plan  [redacted] weeks gestation Reactive NST Chronic HTN Headache in pregnancy Discharge home Follow up in OB office next week PEC precautions  Allergies as of 05/10/2018   No Known Allergies     Medication List    TAKE these medications   aspirin EC 81 MG tablet Take 1 tablet (81 mg total) by mouth daily. Take after 12 weeks for prevention of preeclampsia later in pregnancy   COMFORT FIT MATERNITY SUPP LG Misc 1 Units by Does not apply route daily as needed.   cyclobenzaprine 10 MG tablet Commonly known as:  FLEXERIL Take 0.5-1 tablets (5-10 mg total) by mouth at bedtime as needed for muscle spasms.   Doxylamine-Pyridoxine 10-10 MG Tbec 1 tab in AM, 1 tab mid afternoon 2 tabs at bedtime. Max dose 4 tabs daily. What changed:    how much to take  how to take this  when to take this   ferrous sulfate 160 (50 Fe) MG Tbcr SR tablet Commonly known as:  SLOW FE Take 1 tablet (160 mg total) by mouth every 3 (three) days.   gabapentin 300 MG capsule Commonly known as:  NEURONTIN Take 1 capsule (300 mg total) by mouth 3 (three) times daily as needed.   prenatal multivitamin Tabs tablet Take 1 tablet by mouth daily at 12 noon.   Vitamin D (Ergocalciferol) 50000 units Caps capsule Commonly known as:  DRISDOL Take 1 capsule (50,000 Units total) by mouth every 7 (seven) days.      Donette Larry, CNM 05/10/2018, 12:05 PM

## 2018-05-11 NOTE — Progress Notes (Signed)
I have reviewed the chart and agree with nursing staff's documentation of this patient's encounter.  Brendin Situ, MD 05/11/2018 12:59 AM    

## 2018-05-15 ENCOUNTER — Ambulatory Visit (HOSPITAL_COMMUNITY)
Admission: RE | Admit: 2018-05-15 | Discharge: 2018-05-15 | Disposition: A | Discharge status: Home / Self Care | Payer: Medicaid Other | Source: Ambulatory Visit | Attending: Obstetrics and Gynecology | Admitting: Obstetrics and Gynecology

## 2018-05-15 ENCOUNTER — Encounter (HOSPITAL_COMMUNITY): Payer: Self-pay

## 2018-05-15 DIAGNOSIS — O99213 Obesity complicating pregnancy, third trimester: Secondary | ICD-10-CM

## 2018-05-15 DIAGNOSIS — E0590 Thyrotoxicosis, unspecified without thyrotoxic crisis or storm: Secondary | ICD-10-CM

## 2018-05-15 DIAGNOSIS — O10913 Unspecified pre-existing hypertension complicating pregnancy, third trimester: Secondary | ICD-10-CM | POA: Diagnosis not present

## 2018-05-15 DIAGNOSIS — O10013 Pre-existing essential hypertension complicating pregnancy, third trimester: Secondary | ICD-10-CM | POA: Diagnosis not present

## 2018-05-15 DIAGNOSIS — O99283 Endocrine, nutritional and metabolic diseases complicating pregnancy, third trimester: Secondary | ICD-10-CM | POA: Diagnosis not present

## 2018-05-15 DIAGNOSIS — O3443 Maternal care for other abnormalities of cervix, third trimester: Secondary | ICD-10-CM

## 2018-05-15 DIAGNOSIS — Z3A31 31 weeks gestation of pregnancy: Secondary | ICD-10-CM | POA: Insufficient documentation

## 2018-05-17 ENCOUNTER — Ambulatory Visit (INDEPENDENT_AMBULATORY_CARE_PROVIDER_SITE_OTHER): Payer: Medicaid Other | Admitting: Certified Nurse Midwife

## 2018-05-17 ENCOUNTER — Encounter: Payer: Self-pay | Admitting: Certified Nurse Midwife

## 2018-05-17 VITALS — BP 134/86 | HR 92 | Wt 190.0 lb

## 2018-05-17 DIAGNOSIS — O0993 Supervision of high risk pregnancy, unspecified, third trimester: Secondary | ICD-10-CM

## 2018-05-17 DIAGNOSIS — O09219 Supervision of pregnancy with history of pre-term labor, unspecified trimester: Secondary | ICD-10-CM

## 2018-05-17 DIAGNOSIS — O26893 Other specified pregnancy related conditions, third trimester: Secondary | ICD-10-CM

## 2018-05-17 DIAGNOSIS — O0990 Supervision of high risk pregnancy, unspecified, unspecified trimester: Secondary | ICD-10-CM

## 2018-05-17 DIAGNOSIS — O09899 Supervision of other high risk pregnancies, unspecified trimester: Secondary | ICD-10-CM

## 2018-05-17 DIAGNOSIS — N949 Unspecified condition associated with female genital organs and menstrual cycle: Secondary | ICD-10-CM

## 2018-05-17 DIAGNOSIS — O09213 Supervision of pregnancy with history of pre-term labor, third trimester: Secondary | ICD-10-CM | POA: Diagnosis not present

## 2018-05-17 DIAGNOSIS — O113 Pre-existing hypertension with pre-eclampsia, third trimester: Secondary | ICD-10-CM

## 2018-05-17 DIAGNOSIS — R102 Pelvic and perineal pain: Secondary | ICD-10-CM

## 2018-05-17 DIAGNOSIS — O119 Pre-existing hypertension with pre-eclampsia, unspecified trimester: Secondary | ICD-10-CM

## 2018-05-17 NOTE — Progress Notes (Signed)
   PRENATAL VISIT NOTE  Subjective:  Brittany [name] is a 37 y.o. [phone] at [redacted]w[redacted]d being seen today for ongoing prenatal care.  She is currently monitored for the following issues for this high-risk pregnancy and has Chronic hypertension with superimposed preeclampsia; Subclinical hyperthyroidism; Migraine; Murmur, cardiac; Anemia; Supervision of high risk pregnancy, antepartum; Vitamin D deficiency; H/O LEEP; H/O intrauterine growth restriction in prior pregnancy, currently pregnant; H/O pre-eclampsia in prior pregnancy, currently pregnant; H/O preterm delivery, currently pregnant; Headache in pregnancy, antepartum; Depression affecting pregnancy; Preterm contractions; and Proteinuria affecting pregnancy on their problem list.  Patient reports pelvic pressure.  Contractions: Irregular. Vag. Bleeding: None.  Movement: Present. Denies leaking of fluid.   The following portions of the patient's history were reviewed and updated as appropriate: allergies, current medications, past family history, past medical history, past social history, past surgical history and problem list. Problem list updated.  Objective:   Vitals:   05/17/18 1019  BP: 134/86  Pulse: 92  Weight: 190 lb (86.2 kg)    Fetal Status: Fetal Heart Rate (bpm): 145   Movement: Present  Presentation: Vertex  General:  Alert, oriented and cooperative. Patient is in no acute distress.  Skin: Skin is warm and dry. No rash noted.   Cardiovascular: Normal heart rate noted  Respiratory: Normal respiratory effort, no problems with respiration noted  Abdomen: Soft, gravid, appropriate for gestational age.  Pain/Pressure: Present     Pelvic: Cervical exam performed Dilation: Fingertip Effacement (%): Thick Station: -3  Extremities: Normal range of motion.     Mental Status: Normal mood and affect. Normal behavior. Normal judgment and thought content.   Assessment and Plan:  Pregnancy: [id number] at [redacted]w[redacted]d  1. Chronic hypertension with  superimposed preeclampsia - BP stable in office today, 137/94 and 134/86, currently on no meds  - Patient denies HA, visual changes or RUQ pain at this time  - Continue 17P for hx of preterm delivery  - Plan for delivery at 37 weeks and weekly BPP until delivery   2. Supervision of high risk pregnancy, antepartum - patient doing well, complaints of increased pelvic pressure since this morning, denies contractions, increased vaginal discharge or vaginal bleeding   3. Pelvic pressure in pregnancy, antepartum, third trimester - Cervix evaluation, cervix 0.5/thick  - Preterm labor precautions reviewed and discussed reasons to go to MAU for evaluation, patient verbalizes understanding   Preterm labor symptoms and general obstetric precautions including but not limited to vaginal bleeding, contractions, leaking of fluid and fetal movement were reviewed in detail with the patient. Please refer to After Visit Summary for other counseling recommendations.  Return in about 1 week (around 05/24/2018) for 17P/NST.  Future Appointments  Date Time Provider Department Center  05/22/2018  9:45 AM [facility] [place] [facility]  05/24/2018 10:00 AM Constant, Peggy, MD [facility] None  05/29/2018  9:45 AM [facility] [place] [facility]  06/05/2018  9:45 AM [facility] [place] [facility]  10/08/2018 10:00 AM Evaristo Bury, NP [facility]    Sharyon Cable, CNM

## 2018-05-22 ENCOUNTER — Encounter (HOSPITAL_COMMUNITY): Payer: Self-pay

## 2018-05-22 ENCOUNTER — Inpatient Hospital Stay (HOSPITAL_COMMUNITY)
Admission: AD | Admit: 2018-05-22 | Discharge: 2018-05-22 | Disposition: A | Discharge status: Home / Self Care | Payer: Medicaid Other | Source: Ambulatory Visit | Attending: Obstetrics and Gynecology | Admitting: Obstetrics and Gynecology

## 2018-05-22 ENCOUNTER — Ambulatory Visit (HOSPITAL_COMMUNITY)
Admission: RE | Admit: 2018-05-22 | Discharge: 2018-05-22 | Disposition: A | Discharge status: Home / Self Care | Payer: Medicaid Other | Source: Ambulatory Visit | Attending: Obstetrics and Gynecology | Admitting: Obstetrics and Gynecology

## 2018-05-22 ENCOUNTER — Encounter (HOSPITAL_COMMUNITY): Payer: Self-pay | Admitting: *Deleted

## 2018-05-22 DIAGNOSIS — O10013 Pre-existing essential hypertension complicating pregnancy, third trimester: Secondary | ICD-10-CM

## 2018-05-22 DIAGNOSIS — Z3A32 32 weeks gestation of pregnancy: Secondary | ICD-10-CM | POA: Insufficient documentation

## 2018-05-22 DIAGNOSIS — Z8249 Family history of ischemic heart disease and other diseases of the circulatory system: Secondary | ICD-10-CM | POA: Diagnosis not present

## 2018-05-22 DIAGNOSIS — O163 Unspecified maternal hypertension, third trimester: Secondary | ICD-10-CM | POA: Diagnosis not present

## 2018-05-22 DIAGNOSIS — Z7982 Long term (current) use of aspirin: Secondary | ICD-10-CM | POA: Insufficient documentation

## 2018-05-22 DIAGNOSIS — O09293 Supervision of pregnancy with other poor reproductive or obstetric history, third trimester: Secondary | ICD-10-CM | POA: Diagnosis not present

## 2018-05-22 DIAGNOSIS — O99213 Obesity complicating pregnancy, third trimester: Secondary | ICD-10-CM

## 2018-05-22 DIAGNOSIS — R51 Headache: Secondary | ICD-10-CM | POA: Insufficient documentation

## 2018-05-22 DIAGNOSIS — O26893 Other specified pregnancy related conditions, third trimester: Secondary | ICD-10-CM | POA: Insufficient documentation

## 2018-05-22 DIAGNOSIS — O1493 Unspecified pre-eclampsia, third trimester: Secondary | ICD-10-CM

## 2018-05-22 DIAGNOSIS — O10913 Unspecified pre-existing hypertension complicating pregnancy, third trimester: Secondary | ICD-10-CM | POA: Insufficient documentation

## 2018-05-22 DIAGNOSIS — Z79899 Other long term (current) drug therapy: Secondary | ICD-10-CM | POA: Insufficient documentation

## 2018-05-22 DIAGNOSIS — O119 Pre-existing hypertension with pre-eclampsia, unspecified trimester: Secondary | ICD-10-CM | POA: Diagnosis not present

## 2018-05-22 DIAGNOSIS — Z9889 Other specified postprocedural states: Secondary | ICD-10-CM | POA: Insufficient documentation

## 2018-05-22 DIAGNOSIS — O113 Pre-existing hypertension with pre-eclampsia, third trimester: Secondary | ICD-10-CM | POA: Insufficient documentation

## 2018-05-22 DIAGNOSIS — R519 Headache, unspecified: Secondary | ICD-10-CM

## 2018-05-22 LAB — COMPREHENSIVE METABOLIC PANEL
ALK PHOS: 79 U/L (ref 38–126)
ALT: 19 U/L (ref 0–44)
ANION GAP: 8 (ref 5–15)
AST: 23 U/L (ref 15–41)
Albumin: 2.8 g/dL — ABNORMAL LOW (ref 3.5–5.0)
BUN: 7 mg/dL (ref 6–20)
CALCIUM: 8.3 mg/dL — AB (ref 8.9–10.3)
CO2: 23 mmol/L (ref 22–32)
Chloride: 103 mmol/L (ref 98–111)
Creatinine, Ser: 0.52 mg/dL (ref 0.44–1.00)
GFR calc Af Amer: >60 mL/min (ref >60)
GFR calc non Af Amer: >60 mL/min (ref >60)
Glucose, Bld: 77 mg/dL (ref 70–99)
Potassium: 3.2 mmol/L — ABNORMAL LOW (ref 3.5–5.1)
SODIUM: 134 mmol/L — AB (ref 135–145)
Total Bilirubin: 0.8 mg/dL (ref 0.3–1.2)
Total Protein: 6.2 g/dL — ABNORMAL LOW (ref 6.5–8.1)

## 2018-05-22 LAB — CBC
HCT: 34.6 % — ABNORMAL LOW (ref 36.0–46.0)
Hemoglobin: 11.5 g/dL — ABNORMAL LOW (ref 12.0–15.0)
MCH: 28.9 pg (ref 26.0–34.0)
MCHC: 33.2 g/dL (ref 30.0–36.0)
MCV: 86.9 fL (ref 80.0–100.0)
PLATELETS: 214 10*3/uL (ref 150–400)
RBC: 3.98 MIL/uL (ref 3.87–5.11)
RDW: 14 % (ref 11.5–15.5)
WBC: 12 10*3/uL — AB (ref 4.0–10.5)
nRBC: 0 % (ref 0.0–0.2)

## 2018-05-22 LAB — URINALYSIS, ROUTINE W REFLEX MICROSCOPIC
Bilirubin Urine: NEGATIVE
Glucose, UA: NEGATIVE mg/dL
Hgb urine dipstick: NEGATIVE
KETONES UR: NEGATIVE mg/dL
Leukocytes, UA: NEGATIVE
Nitrite: NEGATIVE
PH: 7 (ref 5.0–8.0)
PROTEIN: NEGATIVE mg/dL
Specific Gravity, Urine: 1.012 (ref 1.005–1.030)

## 2018-05-22 LAB — PROTEIN / CREATININE RATIO, URINE
Creatinine, Urine: 99 mg/dL
PROTEIN CREATININE RATIO: 0.1 mg/mg{creat} (ref 0.00–0.15)
TOTAL PROTEIN, URINE: 10 mg/dL

## 2018-05-22 MED ORDER — PROCHLORPERAZINE EDISYLATE 10 MG/2ML IJ SOLN
10.0000 mg | Freq: Once | INTRAMUSCULAR | Status: AC
  Administered 2018-05-22: 10 mg via INTRAVENOUS
  Filled 2018-05-22: qty 2

## 2018-05-22 MED ORDER — LACTATED RINGERS IV BOLUS
1000.0000 mL | Freq: Once | INTRAVENOUS | Status: AC
  Administered 2018-05-22: 1000 mL via INTRAVENOUS

## 2018-05-22 MED ORDER — DIPHENHYDRAMINE HCL 50 MG/ML IJ SOLN
25.0000 mg | Freq: Once | INTRAMUSCULAR | Status: AC
  Administered 2018-05-22: 25 mg via INTRAVENOUS
  Filled 2018-05-22: qty 1

## 2018-05-22 MED ORDER — DEXAMETHASONE SODIUM PHOSPHATE 10 MG/ML IJ SOLN
10.0000 mg | Freq: Once | INTRAMUSCULAR | Status: AC
  Administered 2018-05-22: 10 mg via INTRAVENOUS
  Filled 2018-05-22: qty 1

## 2018-05-22 MED ORDER — ACETAMINOPHEN 500 MG PO TABS
1000.0000 mg | ORAL_TABLET | Freq: Once | ORAL | Status: AC
  Administered 2018-05-22: 1000 mg via ORAL
  Filled 2018-05-22: qty 2

## 2018-05-22 NOTE — Discharge Instructions (Signed)
Third Trimester of Pregnancy The third trimester is from week 28 through week 40 (months 7 through 9). The third trimester is a time when the unborn baby (fetus) is growing rapidly. At the end of the ninth month, the fetus is about 20 inches in length and weighs 6-10 pounds. Body changes during your third trimester Your body will continue to go through many changes during pregnancy. The changes vary from woman to woman. During the third trimester:  Your weight will continue to increase. You can expect to gain 25-35 pounds (11-16 kg) by the end of the pregnancy.  You may begin to get stretch marks on your hips, abdomen, and breasts.  You may urinate more often because the fetus is moving lower into your pelvis and pressing on your bladder.  You may develop or continue to have heartburn. This is caused by increased hormones that slow down muscles in the digestive tract.  You may develop or continue to have constipation because increased hormones slow digestion and cause the muscles that push waste through your intestines to relax.  You may develop hemorrhoids. These are swollen veins (varicose veins) in the rectum that can itch or be painful.  You may develop swollen, bulging veins (varicose veins) in your legs.  You may have increased body aches in the pelvis, back, or thighs. This is due to weight gain and increased hormones that are relaxing your joints.  You may have changes in your hair. These can include thickening of your hair, rapid growth, and changes in texture. Some women also have hair loss during or after pregnancy, or hair that feels dry or thin. Your hair will most likely return to normal after your baby is born.  Your breasts will continue to grow and they will continue to become tender. A yellow fluid (colostrum) may leak from your breasts. This is the first milk you are producing for your baby.  Your belly button may stick out.  You may notice more swelling in your hands,  face, or ankles.  You may have increased tingling or numbness in your hands, arms, and legs. The skin on your belly may also feel numb.  You may feel short of breath because of your expanding uterus.  You may have more problems sleeping. This can be caused by the size of your belly, increased need to urinate, and an increase in your body's metabolism.  You may notice the fetus "dropping," or moving lower in your abdomen (lightening).  You may have increased vaginal discharge.  You may notice your joints feel loose and you may have pain around your pelvic bone.  What to expect at prenatal visits You will have prenatal exams every 2 weeks until week 36. Then you will have weekly prenatal exams. During a routine prenatal visit:  You will be weighed to make sure you and the baby are growing normally.  Your blood pressure will be taken.  Your abdomen will be measured to track your baby's growth.  The fetal heartbeat will be listened to.  Any test results from the previous visit will be discussed.  You may have a cervical check near your due date to see if your cervix has softened or thinned (effaced).  You will be tested for Group B streptococcus. This happens between 35 and 37 weeks.  Your health care provider may ask you:  What your birth plan is.  How you are feeling.  If you are feeling the baby move.  If you have had   any abnormal symptoms, such as leaking fluid, bleeding, severe headaches, or abdominal cramping.  If you are using any tobacco products, including cigarettes, chewing tobacco, and electronic cigarettes.  If you have any questions.  Other tests or screenings that may be performed during your third trimester include:  Blood tests that check for low iron levels (anemia).  Fetal testing to check the health, activity level, and growth of the fetus. Testing is done if you have certain medical conditions or if there are problems during the  pregnancy.  Nonstress test (NST). This test checks the health of your baby to make sure there are no signs of problems, such as the baby not getting enough oxygen. During this test, a belt is placed around your belly. The baby is made to move, and its heart rate is monitored during movement.  What is false labor? False labor is a condition in which you feel small, irregular tightenings of the muscles in the womb (contractions) that usually go away with rest, changing position, or drinking water. These are called Braxton Hicks contractions. Contractions may last for hours, days, or even weeks before true labor sets in. If contractions come at regular intervals, become more frequent, increase in intensity, or become painful, you should see your health care provider. What are the signs of labor?  Abdominal cramps.  Regular contractions that start at 10 minutes apart and become stronger and more frequent with time.  Contractions that start on the top of the uterus and spread down to the lower abdomen and back.  Increased pelvic pressure and dull back pain.  A watery or bloody mucus discharge that comes from the vagina.  Leaking of amniotic fluid. This is also known as your "water breaking." It could be a slow trickle or a gush. Let your health care provider know if it has a color or strange odor. If you have any of these signs, call your health care provider right away, even if it is before your due date. Follow these instructions at home: Medicines  Follow your health care provider's instructions regarding medicine use. Specific medicines may be either safe or unsafe to take during pregnancy.  Take a prenatal vitamin that contains at least 600 micrograms (mcg) of folic acid.  If you develop constipation, try taking a stool softener if your health care provider approves. Eating and drinking  Eat a balanced diet that includes fresh fruits and vegetables, whole grains, good sources of protein  such as meat, eggs, or tofu, and low-fat dairy. Your health care provider will help you determine the amount of weight gain that is right for you.  Avoid raw meat and uncooked cheese. These carry germs that can cause birth defects in the baby.  If you have low calcium intake from food, talk to your health care provider about whether you should take a daily calcium supplement.  Eat four or five small meals rather than three large meals a day.  Limit foods that are high in fat and processed sugars, such as fried and sweet foods.  To prevent constipation: ? Drink enough fluid to keep your urine clear or pale yellow. ? Eat foods that are high in fiber, such as fresh fruits and vegetables, whole grains, and beans. Activity  Exercise only as directed by your health care provider. Most women can continue their usual exercise routine during pregnancy. Try to exercise for 30 minutes at least 5 days a week. Stop exercising if you experience uterine contractions.  Avoid heavy   lifting.  Do not exercise in extreme heat or humidity, or at high altitudes.  Wear low-heel, comfortable shoes.  Practice good posture.  You may continue to have sex unless your health care provider tells you otherwise. Relieving pain and discomfort  Take frequent breaks and rest with your legs elevated if you have leg cramps or low back pain.  Take warm sitz baths to soothe any pain or discomfort caused by hemorrhoids. Use hemorrhoid cream if your health care provider approves.  Wear a good support bra to prevent discomfort from breast tenderness.  If you develop varicose veins: ? Wear support pantyhose or compression stockings as told by your healthcare provider. ? Elevate your feet for 15 minutes, 3-4 times a day. Prenatal care  Write down your questions. Take them to your prenatal visits.  Keep all your prenatal visits as told by your health care provider. This is important. Safety  Wear your seat belt at  all times when driving.  Make a list of emergency phone numbers, including numbers for family, friends, the hospital, and police and fire departments. General instructions  Avoid cat litter boxes and soil used by cats. These carry germs that can cause birth defects in the baby. If you have a cat, ask someone to clean the litter box for you.  Do not travel far distances unless it is absolutely necessary and only with the approval of your health care provider.  Do not use hot tubs, steam rooms, or saunas.  Do not drink alcohol.  Do not use any products that contain nicotine or tobacco, such as cigarettes and e-cigarettes. If you need help quitting, ask your health care provider.  Do not use any medicinal herbs or unprescribed drugs. These chemicals affect the formation and growth of the baby.  Do not douche or use tampons or scented sanitary pads.  Do not cross your legs for long periods of time.  To prepare for the arrival of your baby: ? Take prenatal classes to understand, practice, and ask questions about labor and delivery. ? Make a trial run to the hospital. ? Visit the hospital and tour the maternity area. ? Arrange for maternity or paternity leave through employers. ? Arrange for family and friends to take care of pets while you are in the hospital. ? Purchase a rear-facing car seat and make sure you know how to install it in your car. ? Pack your hospital bag. ? Prepare the baby's nursery. Make sure to remove all pillows and stuffed animals from the baby's crib to prevent suffocation.  Visit your dentist if you have not gone during your pregnancy. Use a soft toothbrush to brush your teeth and be gentle when you floss. Contact a health care provider if:  You are unsure if you are in labor or if your water has broken.  You become dizzy.  You have mild pelvic cramps, pelvic pressure, or nagging pain in your abdominal area.  You have lower back pain.  You have persistent  nausea, vomiting, or diarrhea.  You have an unusual or bad smelling vaginal discharge.  You have pain when you urinate. Get help right away if:  Your water breaks before 37 weeks.  You have regular contractions less than 5 minutes apart before 37 weeks.  You have a fever.  You are leaking fluid from your vagina.  You have spotting or bleeding from your vagina.  You have severe abdominal pain or cramping.  You have rapid weight loss or weight gain.    You have shortness of breath with chest pain.  You notice sudden or extreme swelling of your face, hands, ankles, feet, or legs.  Your baby makes fewer than 10 movements in 2 hours.  You have severe headaches that do not go away when you take medicine.  You have vision changes. Summary  The third trimester is from week 28 through week 40, months 7 through 9. The third trimester is a time when the unborn baby (fetus) is growing rapidly.  During the third trimester, your discomfort may increase as you and your baby continue to gain weight. You may have abdominal, leg, and back pain, sleeping problems, and an increased need to urinate.  During the third trimester your breasts will keep growing and they will continue to become tender. A yellow fluid (colostrum) may leak from your breasts. This is the first milk you are producing for your baby.  False labor is a condition in which you feel small, irregular tightenings of the muscles in the womb (contractions) that eventually go away. These are called Braxton Hicks contractions. Contractions may last for hours, days, or even weeks before true labor sets in.  Signs of labor can include: abdominal cramps; regular contractions that start at 10 minutes apart and become stronger and more frequent with time; watery or bloody mucus discharge that comes from the vagina; increased pelvic pressure and dull back pain; and leaking of amniotic fluid. This information is not intended to replace advice  given to you by your health care provider. Make sure you discuss any questions you have with your health care provider. Document Released: 06/28/2001 Document Revised: 12/10/2015 Document Reviewed: 09/04/2012 Elsevier Interactive Patient Education  2017 [employer]  

## 2018-05-22 NOTE — MAU Note (Signed)
Pt being seen in [facility] today for U/S.  BP134/95, 145/94 at recheck.  Pt C/O intermittent HA for awhile, unable to pinpoint approx start date with some relief with Tylenol for about 1 hour and then HA returns.  Today she currently has HA but hasn't taken anything except her low dose ASA.  Denies vision changes, epigastric pain, or edema.  Good fetal movement.  Report called to Thornton Dales in MAU.  Pt has transportation issues with family only having 1 vehicle.  Pt to go home and have [facility] drop her back off for evaluation in about 1.5 hours for further evaluation in MAU per Dr. Judeth Cornfield.

## 2018-05-22 NOTE — MAU Note (Signed)
Urine sent to lab 

## 2018-05-22 NOTE — MAU Note (Signed)
Pt sent from [facility]FM to monitor blood pressures and labs to rule out PIH. Pt reports a headache, states she has had a headache throughout her whole pregnancy

## 2018-05-22 NOTE — MAU Provider Note (Signed)
History     CSN: [id number]  Arrival date and time: 05/22/18 1136   First Provider Initiated Contact with Patient 05/22/18 1213      Chief Complaint  Patient presents with  . Headache  . Hypertension   Brittany [name] is a 37 y.o. [phone] at [redacted]w[redacted]d who presents today with elevated blood pressure and headache. She has CHTN, but is not on any medications. She states that she has had headaches off and on the entire pregnancy. She states that this headache today is worse. Mainly that it is stronger than typical. She hasn't taken anything for her headache today other than her daily baby ASA. She states that tylenol usually works. She last took tylenol yesterday.   Headache   This is a new problem. The current episode started more than 1 month ago. The problem occurs daily. The problem has been gradually worsening. The pain is located in the frontal region. The pain does not radiate. The pain quality is similar to prior headaches. The pain is at a severity of 8/10. Pertinent negatives include no abdominal pain or fever. Nothing aggravates the symptoms. She has tried nothing for the symptoms. Her past medical history is significant for hypertension.  Hypertension  Associated symptoms include headaches.    OB History    Gravida  9   Para  3   Term  2   Preterm  1   AB  5   Living  3     SAB  4   TAB  1   Ectopic  0   Multiple  0   Live Births  3           Past Medical History:  Diagnosis Date  . Gallstone   . Headache(784.0)   . Heart murmur   . Hypertension   . Infection    UTI  . Maternal chronic hypertension in first trimester 04/09/2011  . Pregnancy induced hypertension    after 2nd delivery  . Vaginal Pap smear, abnormal    cant remember    Past Surgical History:  Procedure Laterality Date  . DILATION AND CURETTAGE OF UTERUS     dont remember year  . LEEP      Family History  Problem Relation Age of Onset  . Asthma Mother   . Hypertension  Mother   . Asthma Maternal Grandmother   . Hypertension Maternal Grandmother     Social History   Tobacco Use  . Smoking status: Never Smoker  . Smokeless tobacco: Never Used  Substance Use Topics  . Alcohol use: No    Alcohol/week: 0.0 standard drinks  . Drug use: No    Allergies: No Known Allergies  Facility-Administered Medications Prior to Admission  Medication Dose Route Frequency Provider Last Rate Last Dose  . HYDROXYprogesterone Caproate SOAJ 275 mg  275 mg Subcutaneous Weekly Hermina Staggers, MD   275 mg at 05/17/18 1104   Medications Prior to Admission  Medication Sig Dispense Refill Last Dose  . aspirin EC 81 MG tablet Take 1 tablet (81 mg total) by mouth daily. Take after 12 weeks for prevention of preeclampsia later in pregnancy 300 tablet 2 Taking  . cyclobenzaprine (FLEXERIL) 10 MG tablet Take 0.5-1 tablets (5-10 mg total) by mouth at bedtime as needed for muscle spasms. 10 tablet 0 Taking  . Doxylamine-Pyridoxine (DICLEGIS) 10-10 MG TBEC 1 tab in AM, 1 tab mid afternoon 2 tabs at bedtime. Max dose 4 tabs daily. (Patient not taking: Reported  on 05/15/2018) 100 tablet 5 Not Taking  . Elastic Bandages & Supports (COMFORT FIT MATERNITY SUPP LG) MISC 1 Units by Does not apply route daily as needed. 1 each 0 Taking  . ferrous sulfate (SLOW IRON) 160 (50 Fe) MG TBCR SR tablet Take 1 tablet (160 mg total) by mouth every 3 (three) days. 30 each 0 Taking  . gabapentin (NEURONTIN) 300 MG capsule Take 1 capsule (300 mg total) by mouth 3 (three) times daily as needed. 30 capsule 1 Taking  . Prenatal Vit-Fe Fumarate-FA (PRENATAL MULTIVITAMIN) TABS tablet Take 1 tablet by mouth daily at 12 noon.   Taking  . Vitamin D, Ergocalciferol, (DRISDOL) 50000 units CAPS capsule Take 1 capsule (50,000 Units total) by mouth every 7 (seven) days. 30 capsule 2 Taking    Review of Systems  Constitutional: Negative for chills and fever.  Eyes: Negative for visual disturbance.   Gastrointestinal: Negative for abdominal pain.  Neurological: Positive for headaches.   Physical Exam   Blood pressure 126/82, pulse 78, last menstrual period 09/25/2017, unknown if currently breastfeeding.  Physical Exam  Nursing note and vitals reviewed. Constitutional: She is oriented to person, place, and time. She appears well-developed and well-nourished. No distress.  HENT:  Head: Normocephalic.  Cardiovascular: Normal rate.  Respiratory: Effort normal.  GI: Soft. There is no tenderness. There is no rebound.  Neurological: She is alert and oriented to person, place, and time. She has normal reflexes. She exhibits normal muscle tone.  Skin: Skin is warm and dry.  Psychiatric: She has a normal mood and affect.   NST:  Baseline: 130 Variability: moderate Accels: 15x15 Decels: none Toco: none   Results for orders placed or performed during the hospital encounter of 05/22/18 (from the past 24 hour(s))  Protein / creatinine ratio, urine     Status: None   Collection Time: 05/22/18 12:08 PM  Result Value Ref Range   Creatinine, Urine 99.00 mg/dL   Total Protein, Urine 10 mg/dL   Protein Creatinine Ratio 0.10 0.00 - 0.15 mg/mg[Cre]  Urinalysis, Routine w reflex microscopic     Status: None   Collection Time: 05/22/18 12:14 PM  Result Value Ref Range   Color, Urine YELLOW YELLOW   APPearance CLEAR CLEAR   Specific Gravity, Urine 1.012 1.005 - 1.030   pH 7.0 5.0 - 8.0   Glucose, UA NEGATIVE NEGATIVE mg/dL   Hgb urine dipstick NEGATIVE NEGATIVE   Bilirubin Urine NEGATIVE NEGATIVE   Ketones, ur NEGATIVE NEGATIVE mg/dL   Protein, ur NEGATIVE NEGATIVE mg/dL   Nitrite NEGATIVE NEGATIVE   Leukocytes, UA NEGATIVE NEGATIVE  CBC     Status: Abnormal   Collection Time: 05/22/18 12:33 PM  Result Value Ref Range   WBC 12.0 (H) 4.0 - 10.5 K/uL   RBC 3.98 3.87 - 5.11 MIL/uL   Hemoglobin 11.5 (L) 12.0 - 15.0 g/dL   HCT 45.4 (L) 09.8 - 11.9 %   MCV 86.9 80.0 - 100.0 fL   MCH  28.9 26.0 - 34.0 pg   MCHC 33.2 30.0 - 36.0 g/dL   RDW 14.7 82.9 - 56.2 %   Platelets 214 150 - 400 K/uL   nRBC 0.0 0.0 - 0.2 %  Comprehensive metabolic panel     Status: Abnormal   Collection Time: 05/22/18 12:33 PM  Result Value Ref Range   Sodium 134 (L) 135 - 145 mmol/L   Potassium 3.2 (L) 3.5 - 5.1 mmol/L   Chloride 103 98 - 111 mmol/L  CO2 23 22 - 32 mmol/L   Glucose, Bld 77 70 - 99 mg/dL   BUN 7 6 - 20 mg/dL   Creatinine, Ser 1.61 0.44 - 1.00 mg/dL   Calcium 8.3 (L) 8.9 - 10.3 mg/dL   Total Protein 6.2 (L) 6.5 - 8.1 g/dL   Albumin 2.8 (L) 3.5 - 5.0 g/dL   AST 23 15 - 41 U/L   ALT 19 0 - 44 U/L   Alkaline Phosphatase 79 38 - 126 U/L   Total Bilirubin 0.8 0.3 - 1.2 mg/dL   GFR calc non Af Amer >60 >60 mL/min   GFR calc Af Amer >60 >60 mL/min   Anion gap 8 5 - 15    MAU Course  Procedures  MDM  2:16 PM consult with Dr. Earlene Plater, will give headache cocktail today. If no improvement in headache then it will be considered a severe feature.   Patient has had migraine cocktail. She is sleeping off on, but reports that her headache is still present. Currently rating it 5/10.   4:46 PM patient reports that her headache is now 3/10. She is requesting DC home.   4:49 PM Consult with Dr. Adrian Blackwater patient is ok for DC home. FU with the office as planned.   Assessment and Plan   1. Headache in pregnancy, antepartum, third trimester   2. [redacted] weeks gestation of pregnancy   3. Chronic hypertension with superimposed preeclampsia    DC home Pre-eclampsia warning signs reviewed  Comfort measures reviewed  3rd Trimester precautions  PTL precautions  Fetal kick counts RX: none  Return to MAU as needed FU with OB as planned  Follow-up Information    Constant, Peggy, MD Follow up.   Specialty:  Obstetrics and Gynecology Contact information: [address] [place] [address] [phone]           Thressa Sheller 05/22/2018, 2:11 PM

## 2018-05-24 ENCOUNTER — Encounter: Payer: Self-pay | Admitting: Obstetrics and Gynecology

## 2018-05-24 ENCOUNTER — Ambulatory Visit (INDEPENDENT_AMBULATORY_CARE_PROVIDER_SITE_OTHER): Payer: Medicaid Other | Admitting: Obstetrics and Gynecology

## 2018-05-24 VITALS — BP 140/90 | HR 90 | Wt 194.2 lb

## 2018-05-24 DIAGNOSIS — O119 Pre-existing hypertension with pre-eclampsia, unspecified trimester: Secondary | ICD-10-CM

## 2018-05-24 DIAGNOSIS — O0990 Supervision of high risk pregnancy, unspecified, unspecified trimester: Secondary | ICD-10-CM

## 2018-05-24 DIAGNOSIS — O09219 Supervision of pregnancy with history of pre-term labor, unspecified trimester: Secondary | ICD-10-CM

## 2018-05-24 DIAGNOSIS — O09213 Supervision of pregnancy with history of pre-term labor, third trimester: Secondary | ICD-10-CM

## 2018-05-24 DIAGNOSIS — O0993 Supervision of high risk pregnancy, unspecified, third trimester: Secondary | ICD-10-CM

## 2018-05-24 DIAGNOSIS — O113 Pre-existing hypertension with pre-eclampsia, third trimester: Secondary | ICD-10-CM

## 2018-05-24 DIAGNOSIS — O09899 Supervision of other high risk pregnancies, unspecified trimester: Secondary | ICD-10-CM

## 2018-05-24 MED ORDER — HYDROXYPROGESTERONE CAPROATE 275 MG/1.1ML ~~LOC~~ SOAJ
275.0000 mg | Freq: Once | SUBCUTANEOUS | Status: AC
  Administered 2018-05-24: 275 mg via SUBCUTANEOUS

## 2018-05-24 NOTE — Progress Notes (Signed)
   PRENATAL VISIT NOTE  Subjective:  Brittany [name] is a 37 y.o. [phone] at [redacted]w[redacted]d being seen today for ongoing prenatal care.  She is currently monitored for the following issues for this high-risk pregnancy and has Chronic hypertension with superimposed preeclampsia; Subclinical hyperthyroidism; Migraine; Murmur, cardiac; Anemia; Supervision of high risk pregnancy, antepartum; Vitamin D deficiency; H/O LEEP; H/O intrauterine growth restriction in prior pregnancy, currently pregnant; H/O pre-eclampsia in prior pregnancy, currently pregnant; H/O preterm delivery, currently pregnant; Headache in pregnancy, antepartum; Depression affecting pregnancy; Preterm contractions; and Proteinuria affecting pregnancy on their problem list.  Patient reports no complaints.  Contractions: Irregular. Vag. Bleeding: None.  Movement: Present. Denies leaking of fluid.   The following portions of the patient's history were reviewed and updated as appropriate: allergies, current medications, past family history, past medical history, past social history, past surgical history and problem list. Problem list updated.  Objective:   Vitals:   05/24/18 0934  BP: 140/90  Pulse: 90  Weight: 194 lb 3.2 oz (88.1 kg)    Fetal Status: Fetal Heart Rate (bpm): NST    Movement: Present     General:  Alert, oriented and cooperative. Patient is in no acute distress.  Skin: Skin is warm and dry. No rash noted.   Cardiovascular: Normal heart rate noted  Respiratory: Normal respiratory effort, no problems with respiration noted  Abdomen: Soft, gravid, appropriate for gestational age.  Pain/Pressure: Present     Pelvic: Cervical exam deferred        Extremities: Normal range of motion.  Edema: None  Mental Status: Normal mood and affect. Normal behavior. Normal judgment and thought content.   Assessment and Plan:  Pregnancy: [id number] at [redacted]w[redacted]d  1. Supervision of high risk pregnancy, antepartum Patient is doing well She is  frustrated with [facility] evaluations- Education provided on her diagnosis  2. Chronic hypertension with superimposed preeclampsia BP stable today No si/sx of worsening disease Continue antenatal testing- NST reviewed and reactive with baseline 140, mod variability, +accels, no decels Plan for delivery by 37 weeks  3. H/O preterm delivery, currently pregnant Continue weekly 17-P  Preterm labor symptoms and general obstetric precautions including but not limited to vaginal bleeding, contractions, leaking of fluid and fetal movement were reviewed in detail with the patient. Please refer to After Visit Summary for other counseling recommendations.  No follow-ups on file.  Future Appointments  Date Time Provider Department Center  05/29/2018  9:45 AM [facility] [place] [facility]  06/05/2018  9:45 AM [facility] [place] [facility]  10/08/2018 10:00 AM Shambley, Audie Box, NP [facility]    Catalina Antigua, MD

## 2018-05-27 ENCOUNTER — Encounter (HOSPITAL_COMMUNITY): Payer: Self-pay

## 2018-05-27 ENCOUNTER — Inpatient Hospital Stay (HOSPITAL_COMMUNITY)
Admission: EM | Admit: 2018-05-27 | Discharge: 2018-05-27 | Disposition: A | Discharge status: Home / Self Care | Payer: Medicaid Other | Source: Ambulatory Visit | Attending: Obstetrics & Gynecology | Admitting: Obstetrics & Gynecology

## 2018-05-27 DIAGNOSIS — R519 Headache, unspecified: Secondary | ICD-10-CM

## 2018-05-27 DIAGNOSIS — Z3A32 32 weeks gestation of pregnancy: Secondary | ICD-10-CM | POA: Insufficient documentation

## 2018-05-27 DIAGNOSIS — O09899 Supervision of other high risk pregnancies, unspecified trimester: Secondary | ICD-10-CM

## 2018-05-27 DIAGNOSIS — O479 False labor, unspecified: Secondary | ICD-10-CM | POA: Diagnosis not present

## 2018-05-27 DIAGNOSIS — O09213 Supervision of pregnancy with history of pre-term labor, third trimester: Secondary | ICD-10-CM

## 2018-05-27 DIAGNOSIS — Z3689 Encounter for other specified antenatal screening: Secondary | ICD-10-CM

## 2018-05-27 DIAGNOSIS — R51 Headache: Secondary | ICD-10-CM | POA: Diagnosis not present

## 2018-05-27 DIAGNOSIS — O4700 False labor before 37 completed weeks of gestation, unspecified trimester: Secondary | ICD-10-CM

## 2018-05-27 DIAGNOSIS — O26899 Other specified pregnancy related conditions, unspecified trimester: Secondary | ICD-10-CM | POA: Diagnosis present

## 2018-05-27 DIAGNOSIS — M545 Low back pain: Secondary | ICD-10-CM | POA: Diagnosis present

## 2018-05-27 DIAGNOSIS — O09219 Supervision of pregnancy with history of pre-term labor, unspecified trimester: Secondary | ICD-10-CM

## 2018-05-27 DIAGNOSIS — Z7982 Long term (current) use of aspirin: Secondary | ICD-10-CM | POA: Diagnosis not present

## 2018-05-27 LAB — OB RESULTS CONSOLE GC/CHLAMYDIA: GC PROBE AMP, GENITAL: NEGATIVE

## 2018-05-27 LAB — URINALYSIS, ROUTINE W REFLEX MICROSCOPIC
BILIRUBIN URINE: NEGATIVE
GLUCOSE, UA: NEGATIVE mg/dL
HGB URINE DIPSTICK: NEGATIVE
KETONES UR: NEGATIVE mg/dL
Leukocytes, UA: NEGATIVE
NITRITE: NEGATIVE
PH: 6 (ref 5.0–8.0)
Protein, ur: NEGATIVE mg/dL
SPECIFIC GRAVITY, URINE: 1.019 (ref 1.005–1.030)

## 2018-05-27 LAB — WET PREP, GENITAL
Clue Cells Wet Prep HPF POC: NONE SEEN
SPERM: NONE SEEN
Trich, Wet Prep: NONE SEEN
YEAST WET PREP: NONE SEEN

## 2018-05-27 LAB — FETAL FIBRONECTIN: Fetal Fibronectin: POSITIVE — AB

## 2018-05-27 MED ORDER — CYCLOBENZAPRINE HCL 10 MG PO TABS
10.0000 mg | ORAL_TABLET | Freq: Once | ORAL | Status: AC
  Administered 2018-05-27: 10 mg via ORAL
  Filled 2018-05-27: qty 1

## 2018-05-27 NOTE — MAU Provider Note (Signed)
History     CSN: [id number]  Arrival date and time: 05/27/18 1300   First Provider Initiated Contact with Patient 05/27/18 1502      Chief Complaint  Patient presents with  . Back Pain  . Headache  . Pelvic Pain   HPI  Ms.  Brittany [name] is a 37 y.o. year old [phone] female at [redacted]w[redacted]d weeks gestation who presents to [facility] reporting lower back pain, sharp cramping pains in vagina and H/A (intermittent throughout pregnancy); rated 8/10. She reports the vaginal pain started "very early this AM." She has tried Tylenol @0930  for the pain and H/A with no relief. She denies recent SI. She reports good (+) FM today.  Past Medical History:  Diagnosis Date  . Gallstone   . Headache(784.0)   . Heart murmur   . Hypertension   . Infection    UTI  . Maternal chronic hypertension in first trimester 04/09/2011  . Pregnancy induced hypertension    after 2nd delivery  . Vaginal Pap smear, abnormal    cant remember    Past Surgical History:  Procedure Laterality Date  . DILATION AND CURETTAGE OF UTERUS     dont remember year  . LEEP      Family History  Problem Relation Age of Onset  . Asthma Mother   . Hypertension Mother   . Asthma Maternal Grandmother   . Hypertension Maternal Grandmother     Social History   Tobacco Use  . Smoking status: Never Smoker  . Smokeless tobacco: Never Used  Substance Use Topics  . Alcohol use: No    Alcohol/week: 0.0 standard drinks  . Drug use: No    Allergies: No Known Allergies  Facility-Administered Medications Prior to Admission  Medication Dose Route Frequency Provider Last Rate Last Dose  . HYDROXYprogesterone Caproate SOAJ 275 mg  275 mg Subcutaneous Weekly Hermina Staggers, MD   275 mg at 05/17/18 1104   Medications Prior to Admission  Medication Sig Dispense Refill Last Dose  . aspirin EC 81 MG tablet Take 1 tablet (81 mg total) by mouth daily. Take after 12 weeks for prevention of preeclampsia later in pregnancy 300 tablet 2  05/27/2018 at Unknown time  . Doxylamine-Pyridoxine (DICLEGIS) 10-10 MG TBEC 1 tab in AM, 1 tab mid afternoon 2 tabs at bedtime. Max dose 4 tabs daily. 100 tablet 5 Past Week at Unknown time  . ferrous sulfate (SLOW IRON) 160 (50 Fe) MG TBCR SR tablet Take 1 tablet (160 mg total) by mouth every 3 (three) days. 30 each 0 Past Week at Unknown time  . Prenatal Vit-Fe Fumarate-FA (PRENATAL MULTIVITAMIN) TABS tablet Take 1 tablet by mouth daily at 12 noon.   05/27/2018 at Unknown time  . Vitamin D, Ergocalciferol, (DRISDOL) 50000 units CAPS capsule Take 1 capsule (50,000 Units total) by mouth every 7 (seven) days. 30 capsule 2 05/26/2018 at Unknown time  . cyclobenzaprine (FLEXERIL) 10 MG tablet Take 0.5-1 tablets (5-10 mg total) by mouth at bedtime as needed for muscle spasms. (Patient not taking: Reported on 05/24/2018) 10 tablet 0 More than a month at Unknown time  . Elastic Bandages & Supports (COMFORT FIT MATERNITY SUPP LG) MISC 1 Units by Does not apply route daily as needed. (Patient not taking: Reported on 05/24/2018) 1 each 0 More than a month at Unknown time  . gabapentin (NEURONTIN) 300 MG capsule Take 1 capsule (300 mg total) by mouth 3 (three) times daily as needed. (Patient not taking: Reported on  05/24/2018) 30 capsule 1 More than a month at Unknown time    Review of Systems  Constitutional: Negative.   HENT: Negative.   Eyes: Negative.   Respiratory: Negative.   Cardiovascular: Negative.   Gastrointestinal: Negative.   Endocrine: Negative.   Genitourinary: Positive for vaginal pain.  Musculoskeletal: Positive for back pain.  Skin: Negative.   Allergic/Immunologic: Negative.   Neurological: Positive for headaches.  Hematological: Negative.   Psychiatric/Behavioral: Negative.    Physical Exam   Blood pressure 127/79, pulse 79, temperature 98.1 F (36.7 C), temperature source Oral, resp. rate 18, weight 84.5 kg, last menstrual period 09/25/2017.  Physical Exam  Nursing note and  vitals reviewed. Constitutional: She is oriented to person, place, and time. She appears well-developed and well-nourished.  HENT:  Head: Normocephalic and atraumatic.  Eyes: Pupils are equal, round, and reactive to light.  Neck: Normal range of motion.  Cardiovascular: Normal rate.  Respiratory: Effort normal.  GI: Soft.  Genitourinary:  Genitourinary Comments: Uterus: gravid, S=D, SE: cervix is smooth, pink, no lesions, small amt of thick, white vaginal d/c -- WP, GC/CT done, VE: closed/50%/very soft, no CMT or friability, no adnexal tenderness  Musculoskeletal: Normal range of motion.  Neurological: She is alert and oriented to person, place, and time.  Skin: Skin is warm and dry.  Psychiatric: She has a normal mood and affect. Her behavior is normal. Judgment and thought content normal.   MAU Course  Procedures  MDM [facility] fFN Flexeril 10 mg po -- H/A & vaginal pain decreased from 8/10 to 3/10  NST - FHR: 140 bpm / moderate variability / accels present / decels absent / TOCO: none  *Consult with Dr. Erin Fulling @ 1650 - notified of patient's complaints, assessments, lab & NST results, tx plan d/c home with PTL precautions   Results for orders placed or performed during the hospital encounter of 05/27/18 (from the past 24 hour(s))  Urinalysis, Routine w reflex microscopic     Status: Abnormal   Collection Time: 05/27/18  1:21 PM  Result Value Ref Range   Color, Urine YELLOW YELLOW   APPearance HAZY (A) CLEAR   Specific Gravity, Urine 1.019 1.005 - 1.030   pH 6.0 5.0 - 8.0   Glucose, UA NEGATIVE NEGATIVE mg/dL   Hgb urine dipstick NEGATIVE NEGATIVE   Bilirubin Urine NEGATIVE NEGATIVE   Ketones, ur NEGATIVE NEGATIVE mg/dL   Protein, ur NEGATIVE NEGATIVE mg/dL   Nitrite NEGATIVE NEGATIVE   Leukocytes, UA NEGATIVE NEGATIVE  Fetal fibronectin     Status: Abnormal   Collection Time: 05/27/18  3:15 PM  Result Value Ref Range   Fetal Fibronectin POSITIVE (A) NEGATIVE  Wet  prep, genital     Status: Abnormal   Collection Time: 05/27/18  3:15 PM  Result Value Ref Range   Yeast Wet Prep HPF POC NONE SEEN NONE SEEN   Trich, Wet Prep NONE SEEN NONE SEEN   Clue Cells Wet Prep HPF POC NONE SEEN NONE SEEN   WBC, Wet Prep HPF POC FEW (A) NONE SEEN   Sperm NONE SEEN     Assessment and Plan  Preterm contractions  - Continue weekly 17-P injections - Information provided on preventing PTB, preterm contractions  Headache in pregnancy, antepartum  - Take Tylenol prn - Ask at [facility] visit for Rx for something for severe H/As  - Discharge patient - Keep scheduled appts with [facility] and [facility] as scheduled - Patient verbalized an understanding of the plan of care and agrees.  Raelyn Mora, MSN, CNM 05/27/2018, 3:02 PM

## 2018-05-27 NOTE — MAU Note (Signed)
Headache came this morning, took tylenol with no relief  Low back pain, took tylenol with no relief  Sharp cramping pains in vagina  No blurry vision, no epigastric pain

## 2018-05-28 LAB — GC/CHLAMYDIA PROBE AMP (~~LOC~~) NOT AT ARMC
Chlamydia: NEGATIVE
Neisseria Gonorrhea: NEGATIVE

## 2018-05-29 ENCOUNTER — Encounter (HOSPITAL_COMMUNITY): Payer: Self-pay

## 2018-05-29 ENCOUNTER — Ambulatory Visit (HOSPITAL_COMMUNITY)
Admission: RE | Admit: 2018-05-29 | Discharge: 2018-05-29 | Disposition: A | Discharge status: Home / Self Care | Payer: Medicaid Other | Source: Ambulatory Visit | Attending: Obstetrics and Gynecology | Admitting: Obstetrics and Gynecology

## 2018-05-29 DIAGNOSIS — O10013 Pre-existing essential hypertension complicating pregnancy, third trimester: Secondary | ICD-10-CM | POA: Insufficient documentation

## 2018-05-29 DIAGNOSIS — O09293 Supervision of pregnancy with other poor reproductive or obstetric history, third trimester: Secondary | ICD-10-CM | POA: Insufficient documentation

## 2018-05-29 DIAGNOSIS — O3443 Maternal care for other abnormalities of cervix, third trimester: Secondary | ICD-10-CM | POA: Diagnosis not present

## 2018-05-29 DIAGNOSIS — O10913 Unspecified pre-existing hypertension complicating pregnancy, third trimester: Secondary | ICD-10-CM

## 2018-05-29 DIAGNOSIS — O99213 Obesity complicating pregnancy, third trimester: Secondary | ICD-10-CM | POA: Insufficient documentation

## 2018-05-29 DIAGNOSIS — O113 Pre-existing hypertension with pre-eclampsia, third trimester: Secondary | ICD-10-CM | POA: Diagnosis not present

## 2018-05-29 DIAGNOSIS — E0590 Thyrotoxicosis, unspecified without thyrotoxic crisis or storm: Secondary | ICD-10-CM | POA: Diagnosis not present

## 2018-05-29 DIAGNOSIS — O99283 Endocrine, nutritional and metabolic diseases complicating pregnancy, third trimester: Secondary | ICD-10-CM | POA: Diagnosis not present

## 2018-05-29 DIAGNOSIS — Z3A33 33 weeks gestation of pregnancy: Secondary | ICD-10-CM | POA: Diagnosis not present

## 2018-05-31 ENCOUNTER — Encounter (HOSPITAL_COMMUNITY): Payer: Self-pay | Admitting: *Deleted

## 2018-05-31 ENCOUNTER — Telehealth: Payer: Self-pay

## 2018-05-31 ENCOUNTER — Inpatient Hospital Stay (HOSPITAL_COMMUNITY)
Admission: AD | Admit: 2018-05-31 | Discharge: 2018-05-31 | Disposition: A | Discharge status: Home / Self Care | Payer: Medicaid Other | Source: Ambulatory Visit | Attending: Obstetrics & Gynecology | Admitting: Obstetrics & Gynecology

## 2018-05-31 ENCOUNTER — Ambulatory Visit (INDEPENDENT_AMBULATORY_CARE_PROVIDER_SITE_OTHER): Payer: Medicaid Other | Admitting: Obstetrics and Gynecology

## 2018-05-31 ENCOUNTER — Encounter: Payer: Self-pay | Admitting: Obstetrics and Gynecology

## 2018-05-31 VITALS — BP 162/107 | HR 102 | Wt 189.0 lb

## 2018-05-31 DIAGNOSIS — O113 Pre-existing hypertension with pre-eclampsia, third trimester: Secondary | ICD-10-CM

## 2018-05-31 DIAGNOSIS — Z3A33 33 weeks gestation of pregnancy: Secondary | ICD-10-CM | POA: Diagnosis not present

## 2018-05-31 DIAGNOSIS — R51 Headache: Secondary | ICD-10-CM | POA: Diagnosis present

## 2018-05-31 DIAGNOSIS — O10013 Pre-existing essential hypertension complicating pregnancy, third trimester: Secondary | ICD-10-CM | POA: Diagnosis not present

## 2018-05-31 DIAGNOSIS — O10913 Unspecified pre-existing hypertension complicating pregnancy, third trimester: Secondary | ICD-10-CM

## 2018-05-31 DIAGNOSIS — Z9889 Other specified postprocedural states: Secondary | ICD-10-CM

## 2018-05-31 DIAGNOSIS — O26893 Other specified pregnancy related conditions, third trimester: Secondary | ICD-10-CM

## 2018-05-31 DIAGNOSIS — O119 Pre-existing hypertension with pre-eclampsia, unspecified trimester: Secondary | ICD-10-CM

## 2018-05-31 DIAGNOSIS — O09219 Supervision of pregnancy with history of pre-term labor, unspecified trimester: Secondary | ICD-10-CM

## 2018-05-31 DIAGNOSIS — O1213 Gestational proteinuria, third trimester: Secondary | ICD-10-CM

## 2018-05-31 DIAGNOSIS — O09213 Supervision of pregnancy with history of pre-term labor, third trimester: Secondary | ICD-10-CM | POA: Diagnosis not present

## 2018-05-31 DIAGNOSIS — O09293 Supervision of pregnancy with other poor reproductive or obstetric history, third trimester: Secondary | ICD-10-CM

## 2018-05-31 DIAGNOSIS — O09899 Supervision of other high risk pregnancies, unspecified trimester: Secondary | ICD-10-CM

## 2018-05-31 DIAGNOSIS — O0990 Supervision of high risk pregnancy, unspecified, unspecified trimester: Secondary | ICD-10-CM

## 2018-05-31 DIAGNOSIS — O09299 Supervision of pregnancy with other poor reproductive or obstetric history, unspecified trimester: Secondary | ICD-10-CM

## 2018-05-31 DIAGNOSIS — O0993 Supervision of high risk pregnancy, unspecified, third trimester: Secondary | ICD-10-CM

## 2018-05-31 LAB — COMPREHENSIVE METABOLIC PANEL
ALT: 22 U/L (ref 0–44)
ANION GAP: 8 (ref 5–15)
AST: 25 U/L (ref 15–41)
Albumin: 2.9 g/dL — ABNORMAL LOW (ref 3.5–5.0)
Alkaline Phosphatase: 92 U/L (ref 38–126)
BUN: 7 mg/dL (ref 6–20)
CHLORIDE: 106 mmol/L (ref 98–111)
CO2: 21 mmol/L — ABNORMAL LOW (ref 22–32)
CREATININE: 0.55 mg/dL (ref 0.44–1.00)
Calcium: 8.7 mg/dL — ABNORMAL LOW (ref 8.9–10.3)
GFR calc non Af Amer: >60 mL/min (ref >60)
Glucose, Bld: 96 mg/dL (ref 70–99)
POTASSIUM: 3.3 mmol/L — AB (ref 3.5–5.1)
SODIUM: 135 mmol/L (ref 135–145)
Total Bilirubin: 0.3 mg/dL (ref 0.3–1.2)
Total Protein: 6.9 g/dL (ref 6.5–8.1)

## 2018-05-31 LAB — CBC
HEMATOCRIT: 35.7 % — AB (ref 36.0–46.0)
Hemoglobin: 11.6 g/dL — ABNORMAL LOW (ref 12.0–15.0)
MCH: 28.1 pg (ref 26.0–34.0)
MCHC: 32.5 g/dL (ref 30.0–36.0)
MCV: 86.4 fL (ref 80.0–100.0)
NRBC: 0 % (ref 0.0–0.2)
Platelets: 243 10*3/uL (ref 150–400)
RBC: 4.13 MIL/uL (ref 3.87–5.11)
RDW: 14 % (ref 11.5–15.5)
WBC: 11.4 10*3/uL — AB (ref 4.0–10.5)

## 2018-05-31 LAB — URINALYSIS, ROUTINE W REFLEX MICROSCOPIC
Bilirubin Urine: NEGATIVE
GLUCOSE, UA: 50 mg/dL — AB
HGB URINE DIPSTICK: NEGATIVE
Ketones, ur: NEGATIVE mg/dL
Leukocytes, UA: NEGATIVE
Nitrite: NEGATIVE
Protein, ur: NEGATIVE mg/dL
Specific Gravity, Urine: 1.011 (ref 1.005–1.030)
pH: 7 (ref 5.0–8.0)

## 2018-05-31 LAB — PROTEIN / CREATININE RATIO, URINE
Creatinine, Urine: 85 mg/dL
Protein Creatinine Ratio: 0.11 mg/mg{Cre} (ref 0.00–0.15)
Total Protein, Urine: 9 mg/dL

## 2018-05-31 MED ORDER — ACETAMINOPHEN 500 MG PO TABS
1000.0000 mg | ORAL_TABLET | Freq: Once | ORAL | Status: AC
  Administered 2018-05-31: 1000 mg via ORAL
  Filled 2018-05-31: qty 2

## 2018-05-31 MED ORDER — LABETALOL HCL 100 MG PO TABS: 100.0000 mg | ORAL_TABLET | Freq: Two times a day (BID) | ORAL | 0 refills | Status: DC

## 2018-05-31 NOTE — Discharge Instructions (Signed)
Fetal Movement Counts °Patient Name: ________________________________________________ Patient Due Date: ____________________ °What is a fetal movement count? °A fetal movement count is the number of times that you feel your baby move during a certain amount of time. This may also be called a fetal kick count. A fetal movement count is recommended for every pregnant woman. You may be asked to start counting fetal movements as early as week 28 of your pregnancy. °Pay attention to when your baby is most active. You may notice your baby's sleep and wake cycles. You may also notice things that make your baby move more. You should do a fetal movement count: °· When your baby is normally most active. °· At the same time each day. ° °A good time to count movements is while you are resting, after having something to eat and drink. °How do I count fetal movements? °1. Find a quiet, comfortable area. Sit, or lie down on your side. °2. Write down the date, the start time and stop time, and the number of movements that you felt between those two times. Take this information with you to your health care visits. °3. For 2 hours, count kicks, flutters, swishes, rolls, and jabs. You should feel at least 10 movements during 2 hours. °4. You may stop counting after you have felt 10 movements. °5. If you do not feel 10 movements in 2 hours, have something to eat and drink. Then, keep resting and counting for 1 hour. If you feel at least 4 movements during that hour, you may stop counting. °Contact a health care provider if: °· You feel fewer than 4 movements in 2 hours. °· Your baby is not moving like he or she usually does. °Date: ____________ Start time: ____________ Stop time: ____________ Movements: ____________ °Date: ____________ Start time: ____________ Stop time: ____________ Movements: ____________ °Date: ____________ Start time: ____________ Stop time: ____________ Movements: ____________ °Date: ____________ Start time:  ____________ Stop time: ____________ Movements: ____________ °Date: ____________ Start time: ____________ Stop time: ____________ Movements: ____________ °Date: ____________ Start time: ____________ Stop time: ____________ Movements: ____________ °Date: ____________ Start time: ____________ Stop time: ____________ Movements: ____________ °Date: ____________ Start time: ____________ Stop time: ____________ Movements: ____________ °Date: ____________ Start time: ____________ Stop time: ____________ Movements: ____________ °This information is not intended to replace advice given to you by your health care provider. Make sure you discuss any questions you have with your health care provider. °Document Released: 08/03/2006 Document Revised: 03/02/2016 Document Reviewed: 08/13/2015 °Elsevier Interactive Patient Education © 2018 [employer] ° ° ° °Hypertension During Pregnancy °Hypertension, commonly called high blood pressure, is when the force of blood pumping through your arteries is too strong. Arteries are blood vessels that carry blood from the heart throughout the body. Hypertension during pregnancy can cause problems for you and your baby. Your baby may be born early (prematurely) or may not weigh as much as he or she should at birth. Very bad cases of hypertension during pregnancy can be life-threatening. °Different types of hypertension can occur during pregnancy. These include: °· Chronic hypertension. This happens when: °? You have hypertension before pregnancy and it continues during pregnancy. °? You develop hypertension before you are [redacted] weeks pregnant, and it continues during pregnancy. °· Gestational hypertension. This is hypertension that develops after the 20th week of pregnancy. °· Preeclampsia, also called toxemia of pregnancy. This is a very serious type of hypertension that develops only during pregnancy. It affects the whole body, and it can be very dangerous for you and   your baby. ° °Gestational  hypertension and preeclampsia usually go away within 6 weeks after your baby is born. Women who have hypertension during pregnancy have a greater chance of developing hypertension later in life or during future pregnancies. °What are the causes? °The exact cause of hypertension is not known. °What increases the risk? °There are certain factors that make it more likely for you to develop hypertension during pregnancy. These include: °· Having hypertension during a previous pregnancy or prior to pregnancy. °· Being overweight. °· Being older than age 40. °· Being pregnant for the first time or being pregnant with more than one baby. °· Becoming pregnant using fertilization methods such as IVF (in vitro fertilization). °· Having diabetes, kidney problems, or systemic lupus erythematosus. °· Having a family history of hypertension. ° °What are the signs or symptoms? °Chronic hypertension and gestational hypertension rarely cause symptoms. Preeclampsia causes symptoms, which may include: °· Increased protein in your urine. Your health care provider will check for this at every visit before you give birth (prenatal visit). °· Severe headaches. °· Sudden weight gain. °· Swelling of the hands, face, legs, and feet. °· Nausea and vomiting. °· Vision problems, such as blurred or double vision. °· Numbness in the face, arms, legs, and feet. °· Dizziness. °· Slurred speech. °· Sensitivity to bright lights. °· Abdominal pain. °· Convulsions. ° °How is this diagnosed? °You may be diagnosed with hypertension during a routine prenatal exam. At each prenatal visit, you may: °· Have a urine test to check for high amounts of protein in your urine. °· Have your blood pressure checked. A blood pressure reading is recorded as two numbers, such as "120 over 80" (or 120/80). The first ("top") number is called the systolic pressure. It is a measure of the pressure in your arteries when your heart beats. The second ("bottom") number is  called the diastolic pressure. It is a measure of the pressure in your arteries as your heart relaxes between beats. Blood pressure is measured in a unit called mm Hg. A normal blood pressure reading is: °? Systolic: below 120. °? Diastolic: below 80. ° °The type of hypertension that you are diagnosed with depends on your test results and when your symptoms developed. °· Chronic hypertension is usually diagnosed before 20 weeks of pregnancy. °· Gestational hypertension is usually diagnosed after 20 weeks of pregnancy. °· Hypertension with high amounts of protein in the urine is diagnosed as preeclampsia. °· Blood pressure measurements that stay above 160 systolic, or above 110 diastolic, are signs of severe preeclampsia. ° °How is this treated? °Treatment for hypertension during pregnancy varies depending on the type of hypertension you have and how serious it is. °· If you take medicines called ACE inhibitors to treat chronic hypertension, you may need to switch medicines. ACE inhibitors should not be taken during pregnancy. °· If you have gestational hypertension, you may need to take blood pressure medicine. °· If you are at risk for preeclampsia, your health care provider may recommend that you take a low-dose aspirin every day to prevent high blood pressure during your pregnancy. °· If you have severe preeclampsia, you may need to be hospitalized so you and your baby can be monitored closely. You may also need to take medicine (magnesium sulfate) to prevent seizures and to lower blood pressure. This medicine may be given as an injection or through an IV tube. °· In some cases, if your condition gets worse, you may need to deliver your baby   early. ° °Follow these instructions at home: °Eating and drinking °· Drink enough fluid to keep your urine clear or pale yellow. °· Eat a healthy diet that is low in salt (sodium). Do not add salt to your food. Check food labels to see how much sodium a food or beverage  contains. °Lifestyle °· Do not use any products that contain nicotine or tobacco, such as cigarettes and e-cigarettes. If you need help quitting, ask your health care provider. °· Do not use alcohol. °· Avoid caffeine. °· Avoid stress as much as possible. Rest and get plenty of sleep. °General instructions °· Take over-the-counter and prescription medicines only as told by your health care provider. °· While lying down, lie on your left side. This keeps pressure off your baby. °· While sitting or lying down, raise (elevate) your feet. Try putting some pillows under your lower legs. °· Exercise regularly. Ask your health care provider what kinds of exercise are best for you. °· Keep all prenatal and follow-up visits as told by your health care provider. This is important. °Contact a health care provider if: °· You have symptoms that your health care provider told you may require more treatment or monitoring, such as: °? Fever. °? Vomiting. °? Headache. °Get help right away if: °· You have severe abdominal pain or vomiting that does not get better with treatment. °· You suddenly develop swelling in your hands, ankles, or face. °· You gain 4 lbs (1.8 kg) or more in 1 week. °· You develop vaginal bleeding, or you have blood in your urine. °· You do not feel your baby moving as much as usual. °· You have blurred or double vision. °· You have muscle twitching or sudden tightening (spasms). °· You have shortness of breath. °· Your lips or fingernails turn blue. °This information is not intended to replace advice given to you by your health care provider. Make sure you discuss any questions you have with your health care provider. °Document Released: 03/22/2011 Document Revised: 01/22/2016 Document Reviewed: 12/18/2015 °Elsevier Interactive Patient Education © 2018 [employer] Inc[employer] ° °

## 2018-05-31 NOTE — Telephone Encounter (Addendum)
Request for Makena rf called to [facility].

## 2018-05-31 NOTE — Progress Notes (Signed)
Pt states that she had a bad night, didn't feel well-up and down to bathroom. Pt complaints of HA - constant, really no change than before.  17p given today, tolerated well.

## 2018-05-31 NOTE — MAU Provider Note (Signed)
Chief Complaint:  Hypertension and Headache   First Provider Initiated Contact with Patient 05/31/18 1117     HPI: Brittany [name] is a 37 y.o. [id number] at [redacted]w[redacted]d who presents to maternity admissions from the office for hypertension & headache. Patient with hx of chronic hypertension. Currently only taking baby ASA d/t history.  Was in office today & had severe range BP. Reports frontal headache that is 9/10. Has history of recurrent headaches during this pregnancy. Has not treated symptoms today.  Denies visual disturbance or epigastric pain.   Denies contractions, leakage of fluid or vaginal bleeding. Good fetal movement.  Location: frontal headache Quality: throbbing Severity: 9/10 in pain scale Duration: 1 day Timing: constant Modifying factors: nothing makes better or worse. Hasn't treated Associated signs and symptoms: none    Past Medical History:  Diagnosis Date  . Gallstone   . Headache(784.0)   . Heart murmur   . Hypertension   . Infection    UTI  . Maternal chronic hypertension in first trimester 04/09/2011  . Pregnancy induced hypertension    after 2nd delivery  . Vaginal Pap smear, abnormal    cant remember   OB History  Gravida Para Term Preterm AB Living  9 3 2 1 5 3   SAB TAB Ectopic Multiple Live Births  4 1 0 0 3    # Outcome Date GA Lbr Len/2nd Weight Sex Delivery Anes PTL Lv  9 Current           8 Term 05/03/16 [redacted]w[redacted]d 00:08 / 00:01 2175 g M Vag-Spont None  LIV  7 Preterm 04/06/11 102w6d 08:25 / 00:08 2455 g F Vag-Spont None  LIV     Complications: PIH (pregnancy induced hypertension)  6 Term 10/21/05 [redacted]w[redacted]d    Vag-Spont        Complications: Failure to Progress in Second Stage  5 TAB           4 SAB           3 SAB           2 SAB           1 SAB            Past Surgical History:  Procedure Laterality Date  . DILATION AND CURETTAGE OF UTERUS     dont remember year  . LEEP     Family History  Problem Relation Age of Onset  . Asthma Mother   .  Hypertension Mother   . Asthma Maternal Grandmother   . Hypertension Maternal Grandmother    Social History   Tobacco Use  . Smoking status: Never Smoker  . Smokeless tobacco: Never Used  Substance Use Topics  . Alcohol use: No    Alcohol/week: 0.0 standard drinks  . Drug use: No   No Known Allergies No medications prior to admission.    I have reviewed patient's Past Medical Hx, Surgical Hx, Family Hx, Social Hx, medications and allergies.   ROS:  Review of Systems  Constitutional: Negative.   Eyes: Negative for photophobia and visual disturbance.  Gastrointestinal: Negative.   Neurological: Positive for headaches.    Physical Exam   Patient Vitals for the past 24 hrs:  BP Temp Temp src Pulse Resp Height Weight  05/31/18 1305 - 98.6 F (37 C) Oral - 20 - -  05/31/18 1246 126/84 - - 82 - - -  05/31/18 1231 121/77 - - 85 - - -  05/31/18 1216 122/78 - -  69 - - -  05/31/18 1201 125/83 - - 85 - - -  05/31/18 1146 122/85 - - 95 - - -  05/31/18 1130 125/79 - - 85 - - -  05/31/18 1116 132/85 - - 84 - - -  05/31/18 1100 136/88 - - 95 - - -  05/31/18 1045 132/83 - - 89 - - -  05/31/18 1031 123/88 - - 90 - - -  05/31/18 1017 124/81 - - 89 - - -  05/31/18 1000 (!) 143/94 98.3 F (36.8 C) - 98 18 5\' 5"  (1.651 m) 84.8 kg    Constitutional: Well-developed, well-nourished female in no acute distress.  Cardiovascular: normal rate & rhythm, no murmur Respiratory: normal effort, lung sounds clear throughout GI: Abd soft, non-tender, gravid appropriate for gestational age. Pos BS x 4 MS: Extremities nontender, no edema, normal ROM Neurologic: Alert and oriented x 4.   NST:  Baseline: 145 bpm, Variability: Good {> 6 bpm), Accelerations: Reactive and Decelerations: Absent   Labs: Results for orders placed or performed during the hospital encounter of 05/31/18 (from the past 24 hour(s))  Urinalysis, Routine w reflex microscopic     Status: Abnormal   Collection Time: 05/31/18  10:11 AM  Result Value Ref Range   Color, Urine YELLOW YELLOW   APPearance CLEAR CLEAR   Specific Gravity, Urine 1.011 1.005 - 1.030   pH 7.0 5.0 - 8.0   Glucose, UA 50 (A) NEGATIVE mg/dL   Hgb urine dipstick NEGATIVE NEGATIVE   Bilirubin Urine NEGATIVE NEGATIVE   Ketones, ur NEGATIVE NEGATIVE mg/dL   Protein, ur NEGATIVE NEGATIVE mg/dL   Nitrite NEGATIVE NEGATIVE   Leukocytes, UA NEGATIVE NEGATIVE  Protein / creatinine ratio, urine     Status: None   Collection Time: 05/31/18 10:11 AM  Result Value Ref Range   Creatinine, Urine 85.00 mg/dL   Total Protein, Urine 9 mg/dL   Protein Creatinine Ratio 0.11 0.00 - 0.15 mg/mg[Cre]  CBC     Status: Abnormal   Collection Time: 05/31/18 10:55 AM  Result Value Ref Range   WBC 11.4 (H) 4.0 - 10.5 K/uL   RBC 4.13 3.87 - 5.11 MIL/uL   Hemoglobin 11.6 (L) 12.0 - 15.0 g/dL   HCT 16.1 (L) 09.6 - 04.5 %   MCV 86.4 80.0 - 100.0 fL   MCH 28.1 26.0 - 34.0 pg   MCHC 32.5 30.0 - 36.0 g/dL   RDW 40.9 81.1 - 91.4 %   Platelets 243 150 - 400 K/uL   nRBC 0.0 0.0 - 0.2 %  Comprehensive metabolic panel     Status: Abnormal   Collection Time: 05/31/18 10:55 AM  Result Value Ref Range   Sodium 135 135 - 145 mmol/L   Potassium 3.3 (L) 3.5 - 5.1 mmol/L   Chloride 106 98 - 111 mmol/L   CO2 21 (L) 22 - 32 mmol/L   Glucose, Bld 96 70 - 99 mg/dL   BUN 7 6 - 20 mg/dL   Creatinine, Ser 7.82 0.44 - 1.00 mg/dL   Calcium 8.7 (L) 8.9 - 10.3 mg/dL   Total Protein 6.9 6.5 - 8.1 g/dL   Albumin 2.9 (L) 3.5 - 5.0 g/dL   AST 25 15 - 41 U/L   ALT 22 0 - 44 U/L   Alkaline Phosphatase 92 38 - 126 U/L   Total Bilirubin 0.3 0.3 - 1.2 mg/dL   GFR calc non Af Amer >60 >60 mL/min   GFR calc Af Amer >60 >60  mL/min   Anion gap 8 5 - 15    Imaging:  No results found.  MAU Course: Orders Placed This Encounter  Procedures  . Urinalysis, Routine w reflex microscopic  . Protein / creatinine ratio, urine  . CBC  . Comprehensive metabolic panel  . Discharge patient    Meds ordered this encounter  Medications  . acetaminophen (TYLENOL) tablet 1,000 mg  . labetalol (NORMODYNE) 100 MG tablet    Sig: Take 1 tablet (100 mg total) by mouth 2 (two) times daily.    Dispense:  60 tablet    Refill:  0    Order Specific Question:   Supervising Provider    Answer:   Adam PhenixARNOLD, JAMES G [[id number]]    MDM: Reactive NST Elevated BP x 1, not severe range. Otherwise normotensive Negative PEC labs Pt doesn't have ride; only able to give tylenol for headache. H/a down to 6/10. Pt requesting to be discharged home.  Reviewed patient with Dr. Debroah LoopArnold. Will start on labetalol 100 mg BID & f/u in office on Monday  Assessment: 1. Pregnancy headache in third trimester   2. Chronic hypertension with superimposed preeclampsia   3. [redacted] weeks gestation of pregnancy     Plan: Discharge home in stable condition.  PEC precautions   Allergies as of 05/31/2018   No Known Allergies     Medication List    TAKE these medications   aspirin EC 81 MG tablet Take 1 tablet (81 mg total) by mouth daily. Take after 12 weeks for prevention of preeclampsia later in pregnancy   COMFORT FIT MATERNITY SUPP LG Misc 1 Units by Does not apply route daily as needed.   cyclobenzaprine 10 MG tablet Commonly known as:  FLEXERIL Take 0.5-1 tablets (5-10 mg total) by mouth at bedtime as needed for muscle spasms.   Doxylamine-Pyridoxine 10-10 MG Tbec 1 tab in AM, 1 tab mid afternoon 2 tabs at bedtime. Max dose 4 tabs daily.   ferrous sulfate 160 (50 Fe) MG Tbcr SR tablet Commonly known as:  SLOW FE Take 1 tablet (160 mg total) by mouth every 3 (three) days.   gabapentin 300 MG capsule Commonly known as:  NEURONTIN Take 1 capsule (300 mg total) by mouth 3 (three) times daily as needed.   labetalol 100 MG tablet Commonly known as:  NORMODYNE Take 1 tablet (100 mg total) by mouth 2 (two) times daily.   prenatal multivitamin Tabs tablet Take 1 tablet by mouth daily at 12 noon.   Vitamin  D (Ergocalciferol) 1.25 MG (50000 UT) Caps capsule Commonly known as:  DRISDOL Take 1 capsule (50,000 Units total) by mouth every 7 (seven) days.       Judeth HornLawrence, Tihanna Goodson, NP 05/31/2018 7:30 PM

## 2018-05-31 NOTE — MAU Note (Signed)
Pt sent over from clinic for severe range B/P (Chronic HTN). Pt also c/o HA. Reports good fetal movement.

## 2018-05-31 NOTE — Progress Notes (Signed)
   PRENATAL VISIT NOTE  Subjective:  Brittany [name] is a 37 y.o. [phone] at 4751w3d being seen today for ongoing prenatal care.  She is currently monitored for the following issues for this high-risk pregnancy and has Chronic hypertension with superimposed preeclampsia; Subclinical hyperthyroidism; Migraine; Murmur, cardiac; Anemia; Supervision of high risk pregnancy, antepartum; Vitamin D deficiency; H/O LEEP; H/O intrauterine growth restriction in prior pregnancy, currently pregnant; H/O pre-eclampsia in prior pregnancy, currently pregnant; H/O preterm delivery, currently pregnant; Headache in pregnancy, antepartum; Depression affecting pregnancy; Preterm contractions; and Proteinuria affecting pregnancy on their problem list.  Patient reports headache off and on, it is bad today but not the worst she has had. Reports she has had nausea and was up and down all night going to the bathroom with the urge to urinate. Reports she generally doesn't feel well and just "doesn't feel like herself today."  Contractions: Irregular. Vag. Bleeding: None.  Movement: Present. Denies leaking of fluid.   The following portions of the patient's history were reviewed and updated as appropriate: allergies, current medications, past family history, past medical history, past social history, past surgical history and problem list. Problem list updated.  Objective:   Vitals:   05/31/18 0857 05/31/18 0858  BP: (!) 156/107 (!) 162/107  Pulse: (!) 102   Weight: 189 lb (85.7 kg)     Fetal Status: Fetal Heart Rate (bpm): 150   Movement: Present     General:  Alert, oriented and cooperative. Patient is in no acute distress.  Skin: Skin is warm and dry. No rash noted.   Cardiovascular: Normal heart rate noted  Respiratory: Normal respiratory effort, no problems with respiration noted  Abdomen: Soft, gravid, appropriate for gestational age.  Pain/Pressure: Present     Pelvic: Cervical exam deferred        Extremities:  Normal range of motion.     Mental Status: Normal mood and affect. Normal behavior. Normal judgment and thought content.   Assessment and Plan:  Pregnancy: [id number] at 5651w3d  1. Supervision of high risk pregnancy, antepartum  2. H/O preterm delivery, currently pregnant Cont 17P  3. H/O pre-eclampsia in prior pregnancy, currently pregnant  4. H/O LEEP  5. Proteinuria affecting pregnancy in third trimester  6. Chronic hypertension with superimposed preeclampsia Severe range BP today, higher than last few readings Given headache and generally not feeling well, to MAU for eval NST not done here as she will go to MAU for eval  Preterm labor symptoms and general obstetric precautions including but not limited to vaginal bleeding, contractions, leaking of fluid and fetal movement were reviewed in detail with the patient. Please refer to After Visit Summary for other counseling recommendations.  Return in about 1 week (around 06/07/2018) for OB visit (MD).  Future Appointments  Date Time Provider Department Center  06/05/2018  9:45 AM [facility] US 2 [facility]  10/08/2018 10:00 AM Evaristo BuryShambley, Ashleigh N, NP [facility]    Conan BowensKelly M Cadell Gabrielson, MD

## 2018-06-05 ENCOUNTER — Encounter (HOSPITAL_COMMUNITY): Payer: Self-pay

## 2018-06-05 ENCOUNTER — Other Ambulatory Visit (HOSPITAL_COMMUNITY): Payer: Self-pay | Admitting: *Deleted

## 2018-06-05 ENCOUNTER — Ambulatory Visit (HOSPITAL_COMMUNITY)
Admission: RE | Admit: 2018-06-05 | Discharge: 2018-06-05 | Disposition: A | Discharge status: Home / Self Care | Payer: Medicaid Other | Source: Ambulatory Visit | Attending: Obstetrics and Gynecology | Admitting: Obstetrics and Gynecology

## 2018-06-05 DIAGNOSIS — Z3A34 34 weeks gestation of pregnancy: Secondary | ICD-10-CM | POA: Insufficient documentation

## 2018-06-05 DIAGNOSIS — O3443 Maternal care for other abnormalities of cervix, third trimester: Secondary | ICD-10-CM | POA: Diagnosis not present

## 2018-06-05 DIAGNOSIS — O10913 Unspecified pre-existing hypertension complicating pregnancy, third trimester: Secondary | ICD-10-CM | POA: Diagnosis not present

## 2018-06-05 DIAGNOSIS — O99283 Endocrine, nutritional and metabolic diseases complicating pregnancy, third trimester: Secondary | ICD-10-CM | POA: Insufficient documentation

## 2018-06-05 DIAGNOSIS — O09293 Supervision of pregnancy with other poor reproductive or obstetric history, third trimester: Secondary | ICD-10-CM | POA: Diagnosis not present

## 2018-06-05 DIAGNOSIS — O99213 Obesity complicating pregnancy, third trimester: Secondary | ICD-10-CM | POA: Diagnosis not present

## 2018-06-05 DIAGNOSIS — O119 Pre-existing hypertension with pre-eclampsia, unspecified trimester: Secondary | ICD-10-CM

## 2018-06-05 DIAGNOSIS — E0590 Thyrotoxicosis, unspecified without thyrotoxic crisis or storm: Secondary | ICD-10-CM | POA: Insufficient documentation

## 2018-06-07 ENCOUNTER — Encounter: Payer: Medicaid Other | Admitting: Obstetrics and Gynecology

## 2018-06-07 ENCOUNTER — Encounter (HOSPITAL_COMMUNITY): Payer: Self-pay

## 2018-06-07 ENCOUNTER — Other Ambulatory Visit: Payer: Self-pay

## 2018-06-07 ENCOUNTER — Ambulatory Visit (INDEPENDENT_AMBULATORY_CARE_PROVIDER_SITE_OTHER): Payer: Medicaid Other

## 2018-06-07 ENCOUNTER — Inpatient Hospital Stay (HOSPITAL_COMMUNITY)
Admission: AD | Admit: 2018-06-07 | Discharge: 2018-06-07 | Disposition: A | Discharge status: Home / Self Care | Payer: Medicaid Other | Source: Ambulatory Visit | Attending: Obstetrics & Gynecology | Admitting: Obstetrics & Gynecology

## 2018-06-07 VITALS — BP 173/102 | HR 95

## 2018-06-07 DIAGNOSIS — Z7982 Long term (current) use of aspirin: Secondary | ICD-10-CM | POA: Diagnosis not present

## 2018-06-07 DIAGNOSIS — O09213 Supervision of pregnancy with history of pre-term labor, third trimester: Secondary | ICD-10-CM | POA: Diagnosis not present

## 2018-06-07 DIAGNOSIS — O1213 Gestational proteinuria, third trimester: Secondary | ICD-10-CM

## 2018-06-07 DIAGNOSIS — O10013 Pre-existing essential hypertension complicating pregnancy, third trimester: Secondary | ICD-10-CM | POA: Diagnosis not present

## 2018-06-07 DIAGNOSIS — O09899 Supervision of other high risk pregnancies, unspecified trimester: Secondary | ICD-10-CM

## 2018-06-07 DIAGNOSIS — O09219 Supervision of pregnancy with history of pre-term labor, unspecified trimester: Secondary | ICD-10-CM

## 2018-06-07 DIAGNOSIS — O4700 False labor before 37 completed weeks of gestation, unspecified trimester: Secondary | ICD-10-CM

## 2018-06-07 DIAGNOSIS — O113 Pre-existing hypertension with pre-eclampsia, third trimester: Secondary | ICD-10-CM

## 2018-06-07 DIAGNOSIS — R519 Headache, unspecified: Secondary | ICD-10-CM

## 2018-06-07 DIAGNOSIS — Z3A34 34 weeks gestation of pregnancy: Secondary | ICD-10-CM | POA: Diagnosis not present

## 2018-06-07 DIAGNOSIS — O09299 Supervision of pregnancy with other poor reproductive or obstetric history, unspecified trimester: Secondary | ICD-10-CM

## 2018-06-07 DIAGNOSIS — O09293 Supervision of pregnancy with other poor reproductive or obstetric history, third trimester: Secondary | ICD-10-CM

## 2018-06-07 DIAGNOSIS — O119 Pre-existing hypertension with pre-eclampsia, unspecified trimester: Secondary | ICD-10-CM

## 2018-06-07 DIAGNOSIS — O479 False labor, unspecified: Secondary | ICD-10-CM

## 2018-06-07 DIAGNOSIS — O10913 Unspecified pre-existing hypertension complicating pregnancy, third trimester: Secondary | ICD-10-CM

## 2018-06-07 DIAGNOSIS — Z3689 Encounter for other specified antenatal screening: Secondary | ICD-10-CM

## 2018-06-07 DIAGNOSIS — R51 Headache: Secondary | ICD-10-CM

## 2018-06-07 DIAGNOSIS — Z9889 Other specified postprocedural states: Secondary | ICD-10-CM

## 2018-06-07 LAB — COMPREHENSIVE METABOLIC PANEL
ALT: 20 U/L (ref 0–44)
AST: 25 U/L (ref 15–41)
Albumin: 2.8 g/dL — ABNORMAL LOW (ref 3.5–5.0)
Alkaline Phosphatase: 87 U/L (ref 38–126)
Anion gap: 9 (ref 5–15)
BUN: 8 mg/dL (ref 6–20)
CHLORIDE: 106 mmol/L (ref 98–111)
CO2: 21 mmol/L — ABNORMAL LOW (ref 22–32)
Calcium: 8.4 mg/dL — ABNORMAL LOW (ref 8.9–10.3)
Creatinine, Ser: 0.56 mg/dL (ref 0.44–1.00)
GFR calc Af Amer: >60 mL/min (ref >60)
Glucose, Bld: 108 mg/dL — ABNORMAL HIGH (ref 70–99)
Potassium: 3.4 mmol/L — ABNORMAL LOW (ref 3.5–5.1)
SODIUM: 136 mmol/L (ref 135–145)
Total Bilirubin: 0.4 mg/dL (ref 0.3–1.2)
Total Protein: 6.8 g/dL (ref 6.5–8.1)

## 2018-06-07 LAB — CBC
HCT: 34.4 % — ABNORMAL LOW (ref 36.0–46.0)
Hemoglobin: 11.4 g/dL — ABNORMAL LOW (ref 12.0–15.0)
MCH: 28.6 pg (ref 26.0–34.0)
MCHC: 33.1 g/dL (ref 30.0–36.0)
MCV: 86.4 fL (ref 80.0–100.0)
NRBC: 0 % (ref 0.0–0.2)
Platelets: 211 10*3/uL (ref 150–400)
RBC: 3.98 MIL/uL (ref 3.87–5.11)
RDW: 13.9 % (ref 11.5–15.5)
WBC: 10.8 10*3/uL — ABNORMAL HIGH (ref 4.0–10.5)

## 2018-06-07 LAB — URINALYSIS, ROUTINE W REFLEX MICROSCOPIC
Bilirubin Urine: NEGATIVE
Glucose, UA: 50 mg/dL — AB
Hgb urine dipstick: NEGATIVE
KETONES UR: NEGATIVE mg/dL
LEUKOCYTES UA: NEGATIVE
NITRITE: NEGATIVE
PH: 6 (ref 5.0–8.0)
PROTEIN: NEGATIVE mg/dL
SPECIFIC GRAVITY, URINE: 1.018 (ref 1.005–1.030)

## 2018-06-07 LAB — PROTEIN / CREATININE RATIO, URINE
CREATININE, URINE: 121 mg/dL
Protein Creatinine Ratio: 0.13 mg/mg{Cre} (ref 0.00–0.15)
Total Protein, Urine: 16 mg/dL

## 2018-06-07 MED ORDER — BUTALBITAL-APAP-CAFFEINE 50-325-40 MG PO TABS: 1.0000 | ORAL_TABLET | Freq: Four times a day (QID) | ORAL | 0 refills | Status: DC | PRN

## 2018-06-07 MED ORDER — HYDROXYPROGESTERONE CAPROATE 275 MG/1.1ML ~~LOC~~ SOAJ
275.0000 mg | Freq: Once | SUBCUTANEOUS | Status: AC
  Administered 2018-06-07: 275 mg via SUBCUTANEOUS

## 2018-06-07 MED ORDER — OXYCODONE HCL 5 MG PO TABS: 5.0000 mg | ORAL_TABLET | Freq: Once | ORAL | Status: DC

## 2018-06-07 NOTE — MAU Note (Signed)
Sent from clinic, BP up, has a headache.  Denies visual changes, increase in swelling or epigastric pain.

## 2018-06-07 NOTE — Progress Notes (Signed)
Pt presents for 17 p and bp check. Inj given in left arm. Pt tolerated well. Pt does complain of having a headache. She is not currently taking the labetalol because she states that she does not like the way it makes her feel. She is only taking the baby aspirin. Pt advised to go to MAU for evaluation

## 2018-06-07 NOTE — MAU Note (Signed)
Pt has had ongoing headaches for a while rates it 6/10. Pt is on baby aspirin for HTN but does not want to take anything for BP because of the way it makes her feel. Was in clinic for 17P and sent for workup for high BP there. Has tried tylenol for headache but not working.

## 2018-06-07 NOTE — MAU Provider Note (Signed)
History     CSN: [id number]  Arrival date and time: 06/07/18 1027   First Provider Initiated Contact with Patient 06/07/18 1101      Chief Complaint  Patient presents with  . Headache  . Hypertension   [id number] @34 .3 wks sent from office for HTN and HA. PT reports daily frontal HA throughout pregnancy. Took 2 Tylenol about 2 hrs ago but didn't help. Denies visual disturbances, RUQ pain, and CP. Having SOB with activity. Reports good FM. Denies VB, LOF, and ctx. Having some pelvic pressure for a few days. Hx of CHTN, was prescribed Labetalol last week but isn't taking because it made her dizzy.   OB History    Gravida  9   Para  3   Term  2   Preterm  1   AB  5   Living  3     SAB  4   TAB  1   Ectopic  0   Multiple  0   Live Births  3           Past Medical History:  Diagnosis Date  . Gallstone   . Headache(784.0)   . Heart murmur   . Hypertension   . Infection    UTI  . Maternal chronic hypertension in first trimester 04/09/2011  . Pregnancy induced hypertension    after 2nd delivery  . Vaginal Pap smear, abnormal    cant remember    Past Surgical History:  Procedure Laterality Date  . DILATION AND CURETTAGE OF UTERUS     dont remember year  . LEEP      Family History  Problem Relation Age of Onset  . Asthma Mother   . Hypertension Mother   . Asthma Maternal Grandmother   . Hypertension Maternal Grandmother     Social History   Tobacco Use  . Smoking status: Never Smoker  . Smokeless tobacco: Never Used  Substance Use Topics  . Alcohol use: No    Alcohol/week: 0.0 standard drinks  . Drug use: No    Allergies: No Known Allergies  No medications prior to admission.    Review of Systems  Eyes: Negative for visual disturbance.  Respiratory: Positive for shortness of breath.   Cardiovascular: Negative for chest pain.  Gastrointestinal: Negative for abdominal pain.  Genitourinary: Positive for pelvic pain. Negative for  vaginal bleeding and vaginal discharge.  Neurological: Positive for headaches.   Physical Exam   Blood pressure 135/80, pulse 93, weight 87 kg, last menstrual period 09/25/2017, SpO2 96 %, unknown if currently breastfeeding. Patient Vitals for the past 24 hrs:  BP Pulse SpO2 Weight  06/07/18 1216 135/80 93 - -  06/07/18 1201 124/83 76 - -  06/07/18 1146 119/85 82 - -  06/07/18 1145 - - 96 % -  06/07/18 1140 - - 96 % -  06/07/18 1135 - - 97 % -  06/07/18 1131 134/84 91 - -  06/07/18 1120 - - 97 % -  06/07/18 1116 (!) 139/92 93 - -  06/07/18 1115 - - 99 % -  06/07/18 1110 - - 98 % -  06/07/18 1105 - - 99 % -  06/07/18 1101 (!) 136/94 89 - -  06/07/18 1100 - - 98 % -  06/07/18 1056 136/90 84 98 % -  06/07/18 1037 - - - 87 kg    Physical Exam  Constitutional: She is oriented to person, place, and time. She appears well-developed and well-nourished. No distress.  HENT:  Head: Normocephalic and atraumatic.  Neck: Normal range of motion.  Cardiovascular: Normal rate.  Respiratory: Effort normal. No respiratory distress.  Genitourinary:  Genitourinary Comments: SVE closed/thick  Musculoskeletal: Normal range of motion. She exhibits no edema.  Neurological: She is alert and oriented to person, place, and time. No cranial nerve deficit.  Skin: Skin is warm and dry.  Psychiatric: She has a normal mood and affect.  EFM: 145 bpm, mod variability, + accels, no decels Toco: none  Results for orders placed or performed during the hospital encounter of 06/07/18 (from the past 24 hour(s))  Urinalysis, Routine w reflex microscopic     Status: Abnormal   Collection Time: 06/07/18 11:03 AM  Result Value Ref Range   Color, Urine YELLOW YELLOW   APPearance CLEAR CLEAR   Specific Gravity, Urine 1.018 1.005 - 1.030   pH 6.0 5.0 - 8.0   Glucose, UA 50 (A) NEGATIVE mg/dL   Hgb urine dipstick NEGATIVE NEGATIVE   Bilirubin Urine NEGATIVE NEGATIVE   Ketones, ur NEGATIVE NEGATIVE mg/dL    Protein, ur NEGATIVE NEGATIVE mg/dL   Nitrite NEGATIVE NEGATIVE   Leukocytes, UA NEGATIVE NEGATIVE  Protein / creatinine ratio, urine     Status: None   Collection Time: 06/07/18 11:03 AM  Result Value Ref Range   Creatinine, Urine 121.00 mg/dL   Total Protein, Urine 16 mg/dL   Protein Creatinine Ratio 0.13 0.00 - 0.15 mg/mg[Cre]  CBC     Status: Abnormal   Collection Time: 06/07/18 11:07 AM  Result Value Ref Range   WBC 10.8 (H) 4.0 - 10.5 K/uL   RBC 3.98 3.87 - 5.11 MIL/uL   Hemoglobin 11.4 (L) 12.0 - 15.0 g/dL   HCT 54.0 (L) 98.1 - 19.1 %   MCV 86.4 80.0 - 100.0 fL   MCH 28.6 26.0 - 34.0 pg   MCHC 33.1 30.0 - 36.0 g/dL   RDW 47.8 29.5 - 62.1 %   Platelets 211 150 - 400 K/uL   nRBC 0.0 0.0 - 0.2 %  Comprehensive metabolic panel     Status: Abnormal   Collection Time: 06/07/18 11:07 AM  Result Value Ref Range   Sodium 136 135 - 145 mmol/L   Potassium 3.4 (L) 3.5 - 5.1 mmol/L   Chloride 106 98 - 111 mmol/L   CO2 21 (L) 22 - 32 mmol/L   Glucose, Bld 108 (H) 70 - 99 mg/dL   BUN 8 6 - 20 mg/dL   Creatinine, Ser 3.08 0.44 - 1.00 mg/dL   Calcium 8.4 (L) 8.9 - 10.3 mg/dL   Total Protein 6.8 6.5 - 8.1 g/dL   Albumin 2.8 (L) 3.5 - 5.0 g/dL   AST 25 15 - 41 U/L   ALT 20 0 - 44 U/L   Alkaline Phosphatase 87 38 - 126 U/L   Total Bilirubin 0.4 0.3 - 1.2 mg/dL   GFR calc non Af Amer >60 >60 mL/min   GFR calc Af Amer >60 >60 mL/min   Anion gap 9 5 - 15   MAU Course  Procedures  MDM Labs ordered and reviewed. Chart review shows superimposed PEC dx 1 month ago based on proteinuria (PCR 499). Labs normal today, no proteinuria. No severe BPs. Unable to treat HA d/t pt not having transportation and all options cause sedation. Consult with Dr. Macon Large, does not recommend trying a different BP agent since pt cannot tolerate Labetalol and BP fairly well controlled. HA is chronic in nature. Will Rx Fioricet for home use.  Stable for discharge home.   Assessment and Plan  [redacted] weeks  gestation Reactive NST Chronic HTN in pregnancy Intractable HA Discharge home [facility] in 1 week Strict return precautions  Allergies as of 06/07/2018   No Known Allergies     Medication List    TAKE these medications   aspirin EC 81 MG tablet Take 1 tablet (81 mg total) by mouth daily. Take after 12 weeks for prevention of preeclampsia later in pregnancy   butalbital-acetaminophen-caffeine 50-325-40 MG tablet Commonly known as:  FIORICET, ESGIC Take 1-2 tablets by mouth every 6 (six) hours as needed for headache.   COMFORT FIT MATERNITY SUPP LG Misc 1 Units by Does not apply route daily as needed.   cyclobenzaprine 10 MG tablet Commonly known as:  FLEXERIL Take 0.5-1 tablets (5-10 mg total) by mouth at bedtime as needed for muscle spasms.   Doxylamine-Pyridoxine 10-10 MG Tbec 1 tab in AM, 1 tab mid afternoon 2 tabs at bedtime. Max dose 4 tabs daily.   ferrous sulfate 160 (50 Fe) MG Tbcr SR tablet Commonly known as:  SLOW FE Take 1 tablet (160 mg total) by mouth every 3 (three) days.   gabapentin 300 MG capsule Commonly known as:  NEURONTIN Take 1 capsule (300 mg total) by mouth 3 (three) times daily as needed.   labetalol 100 MG tablet Commonly known as:  NORMODYNE Take 1 tablet (100 mg total) by mouth 2 (two) times daily.   prenatal multivitamin Tabs tablet Take 1 tablet by mouth daily at 12 noon.   Vitamin D (Ergocalciferol) 1.25 MG (50000 UT) Caps capsule Commonly known as:  DRISDOL Take 1 capsule (50,000 Units total) by mouth every 7 (seven) days.       Donette Larry, CNM 06/07/2018, 12:37 PM

## 2018-06-07 NOTE — Discharge Instructions (Signed)
Migraine Headache A migraine headache is a very strong throbbing pain on one side or both sides of your head. Migraines can also cause other symptoms. Talk with your doctor about what things may bring on (trigger) your migraine headaches. Follow these instructions at home: Medicines  Take over-the-counter and prescription medicines only as told by your doctor.  Do not drive or use heavy machinery while taking prescription pain medicine.  To prevent or treat constipation while you are taking prescription pain medicine, your doctor may recommend that you: ? Drink enough fluid to keep your pee (urine) clear or pale yellow. ? Take over-the-counter or prescription medicines. ? Eat foods that are high in fiber. These include fresh fruits and vegetables, whole grains, and beans. ? Limit foods that are high in fat and processed sugars. These include fried and sweet foods. Lifestyle  Avoid alcohol.  Do not use any products that contain nicotine or tobacco, such as cigarettes and e-cigarettes. If you need help quitting, ask your doctor.  Get at least 8 hours of sleep every night.  Limit your stress. General instructions   Keep a journal to find out what may bring on your migraines. For example, write down: ? What you eat and drink. ? How much sleep you get. ? Any change in what you eat or drink. ? Any change in your medicines.  If you have a migraine: ? Avoid things that make your symptoms worse, such as bright lights. ? It may help to lie down in a dark, quiet room. ? Do not drive or use heavy machinery. ? Ask your doctor what activities are safe for you.  Keep all follow-up visits as told by your doctor. This is important. Contact a doctor if:  You get a migraine that is different or worse than your usual migraines. Get help right away if:  Your migraine gets very bad.  You have a fever.  You have a stiff neck.  You have trouble seeing.  Your muscles feel weak or like you  cannot control them.  You start to lose your balance a lot.  You start to have trouble walking.  You pass out (faint). This information is not intended to replace advice given to you by your health care provider. Make sure you discuss any questions you have with your health care provider. Document Released: 04/12/2008 Document Revised: 01/22/2016 Document Reviewed: 12/21/2015 Elsevier Interactive Patient Education  2018 [employer].   Hypertension During Pregnancy Hypertension is also called high blood pressure. High blood pressure means that the force of your blood moving in your body is too strong. When you are pregnant, this condition should be watched carefully. It can cause problems for you and your baby. Follow these instructions at home: Eating and drinking  Drink enough fluid to keep your pee (urine) clear or pale yellow.  Eat healthy foods that are low in salt (sodium). ? Do not add salt to your food. ? Check labels on foods and drinks to see much salt is in them. Look on the label where you see "Sodium." Lifestyle  Do not use any products that contain nicotine or tobacco, such as cigarettes and e-cigarettes. If you need help quitting, ask your doctor.  Do not use alcohol.  Avoid caffeine.  Avoid stress. Rest and get plenty of sleep. General instructions  Take over-the-counter and prescription medicines only as told by your doctor.  While lying down, lie on your left side. This keeps pressure off your baby.  While  sitting or lying down, raise (elevate) your feet. Try putting some pillows under your lower legs.  Exercise regularly. Ask your doctor what kinds of exercise are best for you.  Keep all prenatal and follow-up visits as told by your doctor. This is important. Contact a doctor if:  You have symptoms that your doctor told you to watch for, such as: ? Fever. ? Throwing up (vomiting). ? Headache. Get help right away if:  You have very bad pain in your  belly (abdomen).  You are throwing up, and this does not get better with treatment.  You suddenly get swelling in your hands, ankles, or face.  You gain 4 lb (1.8 kg) or more in 1 week.  You get bleeding from your vagina.  You have blood in your pee.  You do not feel your baby moving as much as normal.  You have a change in vision.  You have muscle twitching or sudden tightening (spasms).  You have trouble breathing.  Your lips or fingernails turn blue. This information is not intended to replace advice given to you by your health care provider. Make sure you discuss any questions you have with your health care provider. Document Released: 08/06/2010 Document Revised: 03/15/2016 Document Reviewed: 03/15/2016 Elsevier Interactive Patient Education  [employer]2018 [employer]

## 2018-06-08 ENCOUNTER — Telehealth: Payer: Self-pay

## 2018-06-08 NOTE — Telephone Encounter (Signed)
Patient called stating that the medication (Fiorecet) she was given in the hospital yesterday is not helping with her chronic HA. After consulting with Dr. Clearance CootsHarper, I advised pt to go back to the hospital as they would likely need to give her IV medication at this point. Pt denies any contractions, bleeding, and reports good fetal movement. Pt states she will go over to MAU.

## 2018-06-12 ENCOUNTER — Encounter (HOSPITAL_COMMUNITY): Payer: Self-pay | Admitting: *Deleted

## 2018-06-12 ENCOUNTER — Inpatient Hospital Stay (HOSPITAL_COMMUNITY)
Admission: AD | Admit: 2018-06-12 | Discharge: 2018-06-15 | DRG: 807 | Disposition: A | Discharge status: Home / Self Care | Payer: Medicaid Other | Attending: Obstetrics and Gynecology | Admitting: Obstetrics and Gynecology

## 2018-06-12 ENCOUNTER — Inpatient Hospital Stay (HOSPITAL_COMMUNITY): Payer: Medicaid Other | Admitting: Anesthesiology

## 2018-06-12 ENCOUNTER — Ambulatory Visit (HOSPITAL_BASED_OUTPATIENT_CLINIC_OR_DEPARTMENT_OTHER)
Admission: RE | Admit: 2018-06-12 | Discharge: 2018-06-12 | Disposition: A | Discharge status: Home / Self Care | Payer: Medicaid Other | Source: Ambulatory Visit | Attending: Obstetrics and Gynecology | Admitting: Obstetrics and Gynecology

## 2018-06-12 ENCOUNTER — Other Ambulatory Visit: Payer: Self-pay

## 2018-06-12 ENCOUNTER — Encounter (HOSPITAL_COMMUNITY): Payer: Self-pay

## 2018-06-12 DIAGNOSIS — E0590 Thyrotoxicosis, unspecified without thyrotoxic crisis or storm: Secondary | ICD-10-CM | POA: Diagnosis present

## 2018-06-12 DIAGNOSIS — O99824 Streptococcus B carrier state complicating childbirth: Secondary | ICD-10-CM | POA: Diagnosis present

## 2018-06-12 DIAGNOSIS — O3443 Maternal care for other abnormalities of cervix, third trimester: Secondary | ICD-10-CM

## 2018-06-12 DIAGNOSIS — O10919 Unspecified pre-existing hypertension complicating pregnancy, unspecified trimester: Secondary | ICD-10-CM | POA: Diagnosis present

## 2018-06-12 DIAGNOSIS — O09219 Supervision of pregnancy with history of pre-term labor, unspecified trimester: Secondary | ICD-10-CM

## 2018-06-12 DIAGNOSIS — O114 Pre-existing hypertension with pre-eclampsia, complicating childbirth: Principal | ICD-10-CM | POA: Diagnosis present

## 2018-06-12 DIAGNOSIS — Z3A35 35 weeks gestation of pregnancy: Secondary | ICD-10-CM

## 2018-06-12 DIAGNOSIS — O1414 Severe pre-eclampsia complicating childbirth: Secondary | ICD-10-CM | POA: Diagnosis not present

## 2018-06-12 DIAGNOSIS — O0990 Supervision of high risk pregnancy, unspecified, unspecified trimester: Secondary | ICD-10-CM

## 2018-06-12 DIAGNOSIS — O09293 Supervision of pregnancy with other poor reproductive or obstetric history, third trimester: Secondary | ICD-10-CM

## 2018-06-12 DIAGNOSIS — Z3490 Encounter for supervision of normal pregnancy, unspecified, unspecified trimester: Secondary | ICD-10-CM | POA: Diagnosis present

## 2018-06-12 DIAGNOSIS — O99283 Endocrine, nutritional and metabolic diseases complicating pregnancy, third trimester: Secondary | ICD-10-CM | POA: Diagnosis not present

## 2018-06-12 DIAGNOSIS — O1213 Gestational proteinuria, third trimester: Secondary | ICD-10-CM

## 2018-06-12 DIAGNOSIS — O119 Pre-existing hypertension with pre-eclampsia, unspecified trimester: Secondary | ICD-10-CM | POA: Diagnosis present

## 2018-06-12 DIAGNOSIS — O99213 Obesity complicating pregnancy, third trimester: Secondary | ICD-10-CM | POA: Diagnosis not present

## 2018-06-12 DIAGNOSIS — O1210 Gestational proteinuria, unspecified trimester: Secondary | ICD-10-CM | POA: Diagnosis present

## 2018-06-12 DIAGNOSIS — O09523 Supervision of elderly multigravida, third trimester: Secondary | ICD-10-CM | POA: Diagnosis not present

## 2018-06-12 DIAGNOSIS — O4700 False labor before 37 completed weeks of gestation, unspecified trimester: Secondary | ICD-10-CM

## 2018-06-12 DIAGNOSIS — O10913 Unspecified pre-existing hypertension complicating pregnancy, third trimester: Secondary | ICD-10-CM

## 2018-06-12 DIAGNOSIS — R011 Cardiac murmur, unspecified: Secondary | ICD-10-CM | POA: Diagnosis present

## 2018-06-12 DIAGNOSIS — O1002 Pre-existing essential hypertension complicating childbirth: Secondary | ICD-10-CM | POA: Diagnosis present

## 2018-06-12 DIAGNOSIS — G43909 Migraine, unspecified, not intractable, without status migrainosus: Secondary | ICD-10-CM | POA: Diagnosis present

## 2018-06-12 DIAGNOSIS — Z9889 Other specified postprocedural states: Secondary | ICD-10-CM

## 2018-06-12 DIAGNOSIS — O09299 Supervision of pregnancy with other poor reproductive or obstetric history, unspecified trimester: Secondary | ICD-10-CM

## 2018-06-12 DIAGNOSIS — O479 False labor, unspecified: Secondary | ICD-10-CM

## 2018-06-12 DIAGNOSIS — O09899 Supervision of other high risk pregnancies, unspecified trimester: Secondary | ICD-10-CM

## 2018-06-12 LAB — CBC
HCT: 34.2 % — ABNORMAL LOW (ref 36.0–46.0)
HCT: 36.1 % (ref 36.0–46.0)
HEMOGLOBIN: 12.1 g/dL (ref 12.0–15.0)
Hemoglobin: 11.3 g/dL — ABNORMAL LOW (ref 12.0–15.0)
MCH: 28.4 pg (ref 26.0–34.0)
MCH: 28.9 pg (ref 26.0–34.0)
MCHC: 33 g/dL (ref 30.0–36.0)
MCHC: 33.5 g/dL (ref 30.0–36.0)
MCV: 85.9 fL (ref 80.0–100.0)
MCV: 86.4 fL (ref 80.0–100.0)
NRBC: 0 % (ref 0.0–0.2)
PLATELETS: 213 10*3/uL (ref 150–400)
PLATELETS: 234 10*3/uL (ref 150–400)
RBC: 3.98 MIL/uL (ref 3.87–5.11)
RBC: 4.18 MIL/uL (ref 3.87–5.11)
RDW: 13.8 % (ref 11.5–15.5)
RDW: 13.9 % (ref 11.5–15.5)
WBC: 10.4 10*3/uL (ref 4.0–10.5)
WBC: 15 10*3/uL — ABNORMAL HIGH (ref 4.0–10.5)
nRBC: 0 % (ref 0.0–0.2)

## 2018-06-12 LAB — COMPREHENSIVE METABOLIC PANEL
ALBUMIN: 2.8 g/dL — AB (ref 3.5–5.0)
ALBUMIN: 3 g/dL — AB (ref 3.5–5.0)
ALK PHOS: 94 U/L (ref 38–126)
ALT: 27 U/L (ref 0–44)
ALT: 28 U/L (ref 0–44)
ANION GAP: 8 (ref 5–15)
ANION GAP: 10 (ref 5–15)
AST: 31 U/L (ref 15–41)
AST: 34 U/L (ref 15–41)
Alkaline Phosphatase: 100 U/L (ref 38–126)
BILIRUBIN TOTAL: 0.7 mg/dL (ref 0.3–1.2)
BUN: 9 mg/dL (ref 6–20)
BUN: 9 mg/dL (ref 6–20)
CALCIUM: 8.5 mg/dL — AB (ref 8.9–10.3)
CO2: 21 mmol/L — ABNORMAL LOW (ref 22–32)
CO2: 21 mmol/L — AB (ref 22–32)
Calcium: 8 mg/dL — ABNORMAL LOW (ref 8.9–10.3)
Chloride: 107 mmol/L (ref 98–111)
Chloride: 102 mmol/L (ref 98–111)
Creatinine, Ser: 0.48 mg/dL (ref 0.44–1.00)
Creatinine, Ser: 0.66 mg/dL (ref 0.44–1.00)
GFR calc Af Amer: >60 mL/min (ref >60)
GFR calc non Af Amer: >60 mL/min (ref >60)
GLUCOSE: 82 mg/dL (ref 70–99)
GLUCOSE: 87 mg/dL (ref 70–99)
POTASSIUM: 4 mmol/L (ref 3.5–5.1)
Potassium: 3.4 mmol/L — ABNORMAL LOW (ref 3.5–5.1)
SODIUM: 133 mmol/L — AB (ref 135–145)
Sodium: 136 mmol/L (ref 135–145)
TOTAL PROTEIN: 6.8 g/dL (ref 6.5–8.1)
Total Bilirubin: 0.7 mg/dL (ref 0.3–1.2)
Total Protein: 7.2 g/dL (ref 6.5–8.1)

## 2018-06-12 LAB — PROTEIN / CREATININE RATIO, URINE
CREATININE, URINE: 134 mg/dL
Protein Creatinine Ratio: 0.14 mg/mg{Cre} (ref 0.00–0.15)
Total Protein, Urine: 19 mg/dL

## 2018-06-12 LAB — TYPE AND SCREEN
ABO/RH(D): O POS
Antibody Screen: NEGATIVE

## 2018-06-12 MED ORDER — LABETALOL HCL 5 MG/ML IV SOLN: 40.0000 mg | INTRAVENOUS | Status: DC | PRN

## 2018-06-12 MED ORDER — LIDOCAINE HCL (PF) 1 % IJ SOLN
INTRAMUSCULAR | Status: DC | PRN
  Administered 2018-06-12 (×2): 5 mL via EPIDURAL

## 2018-06-12 MED ORDER — LABETALOL HCL 5 MG/ML IV SOLN: 20.0000 mg | INTRAVENOUS | Status: DC | PRN

## 2018-06-12 MED ORDER — LACTATED RINGERS IV SOLN: 500.0000 mL | Freq: Once | INTRAVENOUS | Status: DC

## 2018-06-12 MED ORDER — ACETAMINOPHEN 325 MG PO TABS
650.0000 mg | ORAL_TABLET | ORAL | Status: DC | PRN
  Administered 2018-06-12 (×2): 650 mg via ORAL
  Filled 2018-06-12 (×2): qty 2

## 2018-06-12 MED ORDER — OXYTOCIN BOLUS FROM INFUSION
500.0000 mL | Freq: Once | INTRAVENOUS | Status: AC
  Administered 2018-06-13: 500 mL via INTRAVENOUS

## 2018-06-12 MED ORDER — FENTANYL CITRATE (PF) 100 MCG/2ML IJ SOLN
100.0000 ug | INTRAMUSCULAR | Status: DC | PRN
  Administered 2018-06-12 (×2): 100 ug via INTRAVENOUS
  Filled 2018-06-12 (×2): qty 2

## 2018-06-12 MED ORDER — EPHEDRINE 5 MG/ML INJ
10.0000 mg | INTRAVENOUS | Status: DC | PRN
  Filled 2018-06-12: qty 2

## 2018-06-12 MED ORDER — DIPHENHYDRAMINE HCL 50 MG/ML IJ SOLN: 12.5000 mg | INTRAMUSCULAR | Status: DC | PRN

## 2018-06-12 MED ORDER — TERBUTALINE SULFATE 1 MG/ML IJ SOLN: 0.2500 mg | Freq: Once | INTRAMUSCULAR | Status: DC | PRN

## 2018-06-12 MED ORDER — OXYTOCIN 40 UNITS IN LACTATED RINGERS INFUSION - SIMPLE MED
1.0000 m[IU]/min | INTRAVENOUS | Status: DC
  Administered 2018-06-12: 2 m[IU]/min via INTRAVENOUS
  Filled 2018-06-12: qty 1000

## 2018-06-12 MED ORDER — HYDRALAZINE HCL 20 MG/ML IJ SOLN: 10.0000 mg | INTRAMUSCULAR | Status: DC | PRN

## 2018-06-12 MED ORDER — LABETALOL HCL 5 MG/ML IV SOLN: 80.0000 mg | INTRAVENOUS | Status: DC | PRN

## 2018-06-12 MED ORDER — MAGNESIUM SULFATE 40 G IN LACTATED RINGERS - SIMPLE
2.0000 g/h | INTRAVENOUS | Status: AC
  Administered 2018-06-13: 2 g/h via INTRAVENOUS
  Filled 2018-06-12 (×2): qty 500

## 2018-06-12 MED ORDER — LIDOCAINE HCL (PF) 1 % IJ SOLN
30.0000 mL | INTRAMUSCULAR | Status: DC | PRN
  Filled 2018-06-12: qty 30

## 2018-06-12 MED ORDER — BETAMETHASONE SOD PHOS & ACET 6 (3-3) MG/ML IJ SUSP
12.0000 mg | INTRAMUSCULAR | Status: DC
  Administered 2018-06-12: 12 mg via INTRAMUSCULAR
  Filled 2018-06-12 (×2): qty 2

## 2018-06-12 MED ORDER — OXYTOCIN 40 UNITS IN LACTATED RINGERS INFUSION - SIMPLE MED: 2.5000 [IU]/h | INTRAVENOUS | Status: DC

## 2018-06-12 MED ORDER — PENICILLIN G 3 MILLION UNITS IVPB - SIMPLE MED
3.0000 10*6.[IU] | INTRAVENOUS | Status: DC
  Administered 2018-06-12 – 2018-06-13 (×3): 3 10*6.[IU] via INTRAVENOUS
  Filled 2018-06-12 (×4): qty 100

## 2018-06-12 MED ORDER — ACETAMINOPHEN 500 MG PO TABS
500.0000 mg | ORAL_TABLET | Freq: Once | ORAL | Status: AC
  Administered 2018-06-12: 500 mg via ORAL
  Filled 2018-06-12: qty 1

## 2018-06-12 MED ORDER — PHENYLEPHRINE 40 MCG/ML (10ML) SYRINGE FOR IV PUSH (FOR BLOOD PRESSURE SUPPORT)
80.0000 ug | PREFILLED_SYRINGE | INTRAVENOUS | Status: DC | PRN
  Filled 2018-06-12: qty 5
  Filled 2018-06-12: qty 10

## 2018-06-12 MED ORDER — FENTANYL CITRATE (PF) 100 MCG/2ML IJ SOLN: 50.0000 ug | INTRAMUSCULAR | Status: DC | PRN

## 2018-06-12 MED ORDER — SODIUM CHLORIDE 0.9 % IV SOLN
5.0000 10*6.[IU] | Freq: Once | INTRAVENOUS | Status: AC
  Administered 2018-06-12: 5 10*6.[IU] via INTRAVENOUS
  Filled 2018-06-12: qty 5

## 2018-06-12 MED ORDER — MAGNESIUM SULFATE BOLUS VIA INFUSION
4.0000 g | Freq: Once | INTRAVENOUS | Status: AC
  Administered 2018-06-12: 4 g via INTRAVENOUS
  Filled 2018-06-12: qty 500

## 2018-06-12 MED ORDER — MISOPROSTOL 50MCG HALF TABLET
50.0000 ug | ORAL_TABLET | Freq: Once | ORAL | Status: AC
  Administered 2018-06-12: 50 ug via ORAL
  Filled 2018-06-12: qty 1

## 2018-06-12 MED ORDER — PHENYLEPHRINE 40 MCG/ML (10ML) SYRINGE FOR IV PUSH (FOR BLOOD PRESSURE SUPPORT)
80.0000 ug | PREFILLED_SYRINGE | INTRAVENOUS | Status: DC | PRN
  Filled 2018-06-12: qty 5

## 2018-06-12 MED ORDER — FENTANYL 2.5 MCG/ML BUPIVACAINE 1/10 % EPIDURAL INFUSION (WH - ANES)
14.0000 mL/h | INTRAMUSCULAR | Status: DC | PRN
  Administered 2018-06-12: 14 mL/h via EPIDURAL
  Filled 2018-06-12: qty 100

## 2018-06-12 MED ORDER — SOD CITRATE-CITRIC ACID 500-334 MG/5ML PO SOLN
30.0000 mL | ORAL | Status: DC | PRN
  Filled 2018-06-12: qty 15

## 2018-06-12 MED ORDER — LACTATED RINGERS IV SOLN
500.0000 mL | INTRAVENOUS | Status: DC | PRN
  Administered 2018-06-12 – 2018-06-13 (×2): 500 mL via INTRAVENOUS

## 2018-06-12 MED ORDER — LACTATED RINGERS IV SOLN
INTRAVENOUS | Status: DC
  Administered 2018-06-12 – 2018-06-13 (×2): via INTRAVENOUS

## 2018-06-12 MED ORDER — ONDANSETRON HCL 4 MG/2ML IJ SOLN: 4.0000 mg | Freq: Four times a day (QID) | INTRAMUSCULAR | Status: DC | PRN

## 2018-06-12 NOTE — Anesthesia Preprocedure Evaluation (Signed)
Anesthesia Evaluation  Patient identified by MRN, date of birth, ID band Patient awake    Reviewed: Allergy & Precautions, H&P , NPO status , Patient's Chart, lab work & pertinent test results  History of Anesthesia Complications Negative for: history of anesthetic complications  Airway Mallampati: II  TM Distance: >3 FB Neck ROM: full    Dental no notable dental hx. (+) Teeth Intact   Pulmonary neg pulmonary ROS,    Pulmonary exam normal breath sounds clear to auscultation       Cardiovascular hypertension, Normal cardiovascular exam Rhythm:regular Rate:Normal     Neuro/Psych negative neurological ROS  negative psych ROS   GI/Hepatic negative GI ROS, Neg liver ROS,   Endo/Other  negative endocrine ROS  Renal/GU negative Renal ROS  negative genitourinary   Musculoskeletal   Abdominal   Peds  Hematology negative hematology ROS (+)   Anesthesia Other Findings   Reproductive/Obstetrics (+) Pregnancy PIH                             Anesthesia Physical Anesthesia Plan  ASA: III  Anesthesia Plan: Epidural   Post-op Pain Management:    Induction:   PONV Risk Score and Plan:   Airway Management Planned:   Additional Equipment:   Intra-op Plan:   Post-operative Plan:   Informed Consent: I have reviewed the patients History and Physical, chart, labs and discussed the procedure including the risks, benefits and alternatives for the proposed anesthesia with the patient or authorized representative who has indicated his/her understanding and acceptance.     Plan Discussed with:   Anesthesia Plan Comments:         Anesthesia Quick Evaluation

## 2018-06-12 NOTE — Anesthesia Procedure Notes (Signed)
Epidural Patient location during procedure: OB  Staffing Anesthesiologist: Anaysia Germer, MD Performed: anesthesiologist   Preanesthetic Checklist Completed: patient identified, site marked, surgical consent, pre-op evaluation, timeout performed, IV checked, risks and benefits discussed and monitors and equipment checked  Epidural Patient position: sitting Prep: DuraPrep Patient monitoring: heart rate, continuous pulse ox and blood pressure Approach: right paramedian Location: L3-L4 Injection technique: LOR saline  Needle:  Needle type: Tuohy  Needle gauge: 17 G Needle length: 9 cm and 9 Needle insertion depth: 6 cm Catheter type: closed end flexible Catheter size: 20 Guage Catheter at skin depth: 10 cm Test dose: negative  Assessment Events: blood not aspirated, injection not painful, no injection resistance, negative IV test and no paresthesia  Additional Notes Patient identified. Risks/Benefits/Options discussed with patient including but not limited to bleeding, infection, nerve damage, paralysis, failed block, incomplete pain control, headache, blood pressure changes, nausea, vomiting, reactions to medication both or allergic, itching and postpartum back pain. Confirmed with bedside nurse the patient's most recent platelet count. Confirmed with patient that they are not currently taking any anticoagulation, have any bleeding history or any family history of bleeding disorders. Patient expressed understanding and wished to proceed. All questions were answered. Sterile technique was used throughout the entire procedure. Please see nursing notes for vital signs. Test dose was given through epidural needle and negative prior to continuing to dose epidural or start infusion. Warning signs of high block given to the patient including shortness of breath, tingling/numbness in hands, complete motor block, or any concerning symptoms with instructions to call for help. Patient was given  instructions on fall risk and not to get out of bed. All questions and concerns addressed with instructions to call with any issues.     

## 2018-06-12 NOTE — MAU Note (Signed)
Pt presents to MAU from clinic for monitoring. PT states she has a headache on and off with blurred vision.  +FM

## 2018-06-12 NOTE — H&P (Addendum)
LABOR AND DELIVERY ADMISSION HISTORY AND PHYSICAL NOTE  Brittany [name] is a 37 y.o. female [phone] with IUP at [redacted]w[redacted]d by early U/S=18w presenting for IOL secondary to cHTN w/superimposed PEC. She was seen in MFM today and had headaches and vision changes at that time. She was prescribed labetalol for BP, but was unable to tolerate it. She has not been doing any home monitoring so is unsure of her control at home.  She reports positive fetal movement. She denies leakage of fluid or vaginal bleeding.  Prenatal History/Complications: PNC at [facility] Pregnancy complications:  - cHTN with superimposed PEC - subclinical hyperthyroidism - Hx of PEC - Hx of LEEP - Hx of IUGR - Hx of preterm delivery    Past Medical History: Past Medical History:  Diagnosis Date  . Gallstone   . Headache(784.0)   . Heart murmur   . Hypertension   . Infection    UTI  . Maternal chronic hypertension in first trimester 04/09/2011  . Pregnancy induced hypertension    after 2nd delivery  . Vaginal Pap smear, abnormal    cant remember    Past Surgical History: Past Surgical History:  Procedure Laterality Date  . DILATION AND CURETTAGE OF UTERUS     dont remember year  . LEEP      Obstetrical History: OB History    Gravida  9   Para  3   Term  2   Preterm  1   AB  5   Living  3     SAB  4   TAB  1   Ectopic  0   Multiple  0   Live Births  3           Social History: Social History   Socioeconomic History  . Marital status: Married    Spouse name: Not on file  . Number of children: Not on file  . Years of education: Not on file  . Highest education level: Not on file  Occupational History  . Not on file  Social Needs  . Financial resource strain: Not on file  . Food insecurity:    Worry: Not on file    Inability: Not on file  . Transportation needs:    Medical: Not on file    Non-medical: Not on file  Tobacco Use  . Smoking status: Never  Smoker  . Smokeless tobacco: Never Used  Substance and Sexual Activity  . Alcohol use: No    Alcohol/week: 0.0 standard drinks  . Drug use: No  . Sexual activity: Yes    Birth control/protection: None  Lifestyle  . Physical activity:    Days per week: Not on file    Minutes per session: Not on file  . Stress: Not on file  Relationships  . Social connections:    Talks on phone: Not on file    Gets together: Not on file    Attends religious service: Not on file    Active member of club or organization: Not on file    Attends meetings of clubs or organizations: Not on file    Relationship status: Not on file  Other Topics Concern  . Not on file  Social History Narrative  . Not on file    Family History: Family History  Problem Relation Age of Onset  . Asthma Mother   . Hypertension Mother   . Asthma Maternal Grandmother   . Hypertension Maternal Grandmother  Allergies: No Known Allergies  Facility-Administered Medications Prior to Admission  Medication Dose Route Frequency Provider Last Rate Last Dose  . HYDROXYprogesterone Caproate SOAJ 275 mg  275 mg Subcutaneous Weekly Hermina StaggersErvin, Michael L, MD   275 mg at 05/31/18 [id number]   Medications Prior to Admission  Medication Sig Dispense Refill Last Dose  . acetaminophen (TYLENOL) 500 MG tablet Take 1,000 mg by mouth every 6 (six) hours as needed for mild pain or headache.   06/12/2018 at Unknown time  . aspirin EC 81 MG tablet Take 1 tablet (81 mg total) by mouth daily. Take after 12 weeks for prevention of preeclampsia later in pregnancy 300 tablet 2 06/12/2018 at Unknown time  . butalbital-acetaminophen-caffeine (FIORICET, ESGIC) 50-325-40 MG tablet Take 1-2 tablets by mouth every 6 (six) hours as needed for headache. 20 tablet 0 Past Week at Unknown time  . Doxylamine-Pyridoxine (DICLEGIS) 10-10 MG TBEC 1 tab in AM, 1 tab mid afternoon 2 tabs at bedtime. Max dose 4 tabs daily. 100 tablet 5 Past Week at Unknown time  . ferrous  sulfate (SLOW IRON) 160 (50 Fe) MG TBCR SR tablet Take 1 tablet (160 mg total) by mouth every 3 (three) days. 30 each 0 Past Week at Unknown time  . Prenatal Vit-Fe Fumarate-FA (PRENATAL MULTIVITAMIN) TABS tablet Take 1 tablet by mouth daily at 12 noon.   06/12/2018 at Unknown time  . cyclobenzaprine (FLEXERIL) 10 MG tablet Take 0.5-1 tablets (5-10 mg total) by mouth at bedtime as needed for muscle spasms. (Patient not taking: Reported on 05/24/2018) 10 tablet 0 Not Taking at Unknown time  . Elastic Bandages & Supports (COMFORT FIT MATERNITY SUPP LG) MISC 1 Units by Does not apply route daily as needed. (Patient not taking: Reported on 05/24/2018) 1 each 0 Not Taking at Unknown time  . gabapentin (NEURONTIN) 300 MG capsule Take 1 capsule (300 mg total) by mouth 3 (three) times daily as needed. (Patient not taking: Reported on 05/24/2018) 30 capsule 1 Not Taking at Unknown time  . labetalol (NORMODYNE) 100 MG tablet Take 1 tablet (100 mg total) by mouth 2 (two) times daily. (Patient not taking: Reported on 06/05/2018) 60 tablet 0 Not Taking at Unknown time  . Vitamin D, Ergocalciferol, (DRISDOL) 50000 units CAPS capsule Take 1 capsule (50,000 Units total) by mouth every 7 (seven) days. (Patient not taking: Reported on 05/31/2018) 30 capsule 2 Not Taking at Unknown time     Review of Systems  All systems reviewed and negative except as stated in HPI  Physical Exam Blood pressure (!) 154/89, pulse 78, temperature 98.1 F (36.7 C), temperature source Oral, resp. rate 18, height 5\' 5"  (1.651 m), weight 88 kg, last menstrual period 09/25/2017, SpO2 99 %, unknown if currently breastfeeding. General appearance: alert, oriented, NAD Lungs: normal respiratory effort Heart: regular rate Abdomen: soft, non-tender; gravid, FH appropriate for GA Extremities: No calf swelling or tenderness Presentation: cephalic Fetal monitoring: Baseline rate 140s with moderate variability. +accelerations. No decelerations.   Uterine activity: Irregular mild contractions    Prenatal labs: ABO, Rh: O/Positive/-- (05/28 0914) Antibody: Negative (05/28 0914) Rubella: 2.56 (05/28 0914) RPR: Non Reactive (10/10 1015)  HBsAg: Negative (05/28 0914)  HIV: Non Reactive (10/10 1015)  GC/Chlamydia: Negative (05/27/18) GBS:  Positive 2-hr GTT: 101 (04/26/18) Genetic screening:  Panorama low risk Anatomy US: Normal  Prenatal Transfer Tool  Maternal Diabetes: No Genetic Screening: Normal Maternal Ultrasounds/Referrals: Normal Fetal Ultrasounds or other Referrals:  None Maternal Substance Abuse:  No Significant Maternal Medications:  None Significant Maternal Lab Results: Lab values include: Group B Strep positive  Results for orders placed or performed during the hospital encounter of 06/12/18 (from the past 24 hour(s))  Comprehensive metabolic panel   Collection Time: 06/12/18 11:06 AM  Result Value Ref Range   Sodium 136 135 - 145 mmol/L   Potassium 3.4 (L) 3.5 - 5.1 mmol/L   Chloride 107 98 - 111 mmol/L   CO2 21 (L) 22 - 32 mmol/L   Glucose, Bld 82 70 - 99 mg/dL   BUN 9 6 - 20 mg/dL   Creatinine, Ser 1.61 0.44 - 1.00 mg/dL   Calcium 8.5 (L) 8.9 - 10.3 mg/dL   Total Protein 6.8 6.5 - 8.1 g/dL   Albumin 2.8 (L) 3.5 - 5.0 g/dL   AST 31 15 - 41 U/L   ALT 27 0 - 44 U/L   Alkaline Phosphatase 94 38 - 126 U/L   Total Bilirubin 0.7 0.3 - 1.2 mg/dL   GFR calc non Af Amer >60 >60 mL/min   GFR calc Af Amer >60 >60 mL/min   Anion gap 8 5 - 15  CBC   Collection Time: 06/12/18 11:06 AM  Result Value Ref Range   WBC 10.4 4.0 - 10.5 K/uL   RBC 3.98 3.87 - 5.11 MIL/uL   Hemoglobin 11.3 (L) 12.0 - 15.0 g/dL   HCT 09.6 (L) 04.5 - 40.9 %   MCV 85.9 80.0 - 100.0 fL   MCH 28.4 26.0 - 34.0 pg   MCHC 33.0 30.0 - 36.0 g/dL   RDW 81.1 91.4 - 78.2 %   Platelets 213 150 - 400 K/uL   nRBC 0.0 0.0 - 0.2 %  Protein / creatinine ratio, urine   Collection Time: 06/12/18 11:21 AM  Result Value Ref Range   Creatinine,  Urine 134.00 mg/dL   Total Protein, Urine 19 mg/dL   Protein Creatinine Ratio 0.14 0.00 - 0.15 mg/mg[Cre]    Patient Active Problem List   Diagnosis Date Noted  . Encounter for induction of labor 06/12/2018  . Proteinuria affecting pregnancy 04/27/2018  . Preterm contractions 03/09/2018  . 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 Headache in pregnancy, antepartum 02/06/2018  . H/O LEEP 01/09/2018  . H/O intrauterine growth restriction in prior pregnancy, currently pregnant 01/09/2018  . H/O pre-eclampsia in prior pregnancy, currently pregnant 01/09/2018  . H/O preterm delivery, currently pregnant 01/09/2018  . Vitamin D deficiency 12/14/2017  . Supervision of high risk pregnancy, antepartum 12/12/2017  . Murmur, cardiac 11/30/2016  . Anemia 11/30/2016  . Migraine 03/18/2016  . Subclinical hyperthyroidism 12/09/2015  . Chronic hypertension with superimposed preeclampsia 04/09/2011    Assessment: Brittany [name] is a 37 y.o. [id number] at [redacted]w[redacted]d here for IOL secondary to superimposed PEC.   #Labor: IOL today with FB and cytotec given cervix unfavorable #Pain: Pt in mild amount of pain currently. Is undecided on pain management for labor but is considering an epidural.  #FWB: Fetal movement reassuring. Baseline rate 140s with accelerations. No decels.  #ID:  GBS pos: Penicillin G  #MOF: Breast feeding #MOC: Nexplanon #PreE w severe features: start magnesium now and continue 24H postpartum #Preterm labor: BMZ Q24H x2 doses #HA: high dose tylenol ordered x1; consider alternative medication if pain persists  Erin Elphick, PA-S. 06/12/2018, 2:10 PM   OB FELLOW HISTORY AND PHYSICAL ATTESTATION  I have seen and examined this patient; I agree with above documentation in the student's note.  Brittany [name] presents with preE with severe features (  headace and blurry vision) requiring delivery at this time. Labs are normal on admission. Will repeat Q12H. Cr levels normal. Obtain magnesium  level if concern for toxicity or if creatinine becomes elevated. Has received cytotec at this time. Will attempt to place FB as well. Start [facility] for GBS positive and repeat BMZ if pt remains pregnant at 24 hours.   Gwenevere Abbot, MD OB Fellow  06/12/2018, 7:19 PM

## 2018-06-12 NOTE — MAU Note (Signed)
Pt seen in M[facility] by Dr. Judeth CornfieldShankar.  Pt states she was started on labetalol approx 1 week ago. Reports it made her dizzy so she was told to discontinue the med.  Report called to [place] Holiday[place], RN in MAU.  Pt taken to MAU for further evaluation.

## 2018-06-12 NOTE — Progress Notes (Signed)
Brittany ArnoldStacy Jimmie MollyLerika [name] is a 37 y.o. [id number] at 2333w1d by LMP confirmed by US admitted for induction of labor due to Chronic Hypertension with Superimposed Preeclampsia.  Subjective: Resting comfortably s/p IV Fentanyl x 1. Does not feel contractions.  Objective: BP (!) 156/94 (BP Location: Left Arm)   Pulse 96   Temp 98.1 F (36.7 C) (Oral)   Resp 16   Ht 5\' 5"  (1.651 m)   Wt 88 kg   LMP 09/25/2017   SpO2 98%   BMI 32.28 kg/m  No intake/output data recorded. No intake/output data recorded.  FHT:  FHR: 135 bpm, variability: moderate,  accelerations:  Present,  decelerations:  Absent UC:   irregular, every 4-7 minutes SVE:   Dilation: 1.5 Effacement (%): Thick Station: -3 Exam by:: MD Montez HagemanPhillip  Labs: Lab Results  Component Value Date   WBC 10.4 06/12/2018   HGB 11.3 (L) 06/12/2018   HCT 34.2 (L) 06/12/2018   MCV 85.9 06/12/2018   PLT 213 06/12/2018    Assessment / Plan: Foley bulb placed now, tolerate well by patient S/P 50 mcg oral Cytotec at 1630 Minor cervical bleeding with balloon inflation Category I fetal tracing MgSO4 infusing, no signs of toxicity Anticipated MOD:  NSVD  Brittany CantorSamantha C Alek [name], CNM 06/12/2018, 5:37 PM

## 2018-06-12 NOTE — MAU Provider Note (Signed)
History     CSN: [id number]  Arrival date and time: 06/12/18 1031   First Provider Initiated Contact with Patient 06/12/18 1106      Chief Complaint  Patient presents with  . Hypertension   [id number] @35 .1 sent from MFM for HA, visual disturbances, and worsening BPs. Reports intermittent blurry vision today. HA has been chronic and daily. She took Tylenol but had no relief. Denies CP. Having some intermittent SOB. Reports good FM. No Vb, LOF, or ctx. Her pregnancy has been complicated by [facility] (not on meds), and chronic HAs.   OB History    Gravida  9   Para  3   Term  2   Preterm  1   AB  5   Living  3     SAB  4   TAB  1   Ectopic  0   Multiple  0   Live Births  3           Past Medical History:  Diagnosis Date  . Gallstone   . Headache(784.0)   . Heart murmur   . Hypertension   . Infection    UTI  . Maternal chronic hypertension in first trimester 04/09/2011  . Pregnancy induced hypertension    after 2nd delivery  . Vaginal Pap smear, abnormal    cant remember    Past Surgical History:  Procedure Laterality Date  . DILATION AND CURETTAGE OF UTERUS     dont remember year  . LEEP      Family History  Problem Relation Age of Onset  . Asthma Mother   . Hypertension Mother   . Asthma Maternal Grandmother   . Hypertension Maternal Grandmother     Social History   Tobacco Use  . Smoking status: Never Smoker  . Smokeless tobacco: Never Used  Substance Use Topics  . Alcohol use: No    Alcohol/week: 0.0 standard drinks  . Drug use: No    Allergies: No Known Allergies  Facility-Administered Medications Prior to Admission  Medication Dose Route Frequency Provider Last Rate Last Dose  . HYDROXYprogesterone Caproate SOAJ 275 mg  275 mg Subcutaneous Weekly Hermina Staggers, MD   275 mg at 05/31/18 1478   Medications Prior to Admission  Medication Sig Dispense Refill Last Dose  . aspirin EC 81 MG tablet Take 1 tablet (81 mg total) by  mouth daily. Take after 12 weeks for prevention of preeclampsia later in pregnancy 300 tablet 2 Taking  . butalbital-acetaminophen-caffeine (FIORICET, ESGIC) 50-325-40 MG tablet Take 1-2 tablets by mouth every 6 (six) hours as needed for headache. 20 tablet 0 Taking  . cyclobenzaprine (FLEXERIL) 10 MG tablet Take 0.5-1 tablets (5-10 mg total) by mouth at bedtime as needed for muscle spasms. (Patient not taking: Reported on 05/24/2018) 10 tablet 0 Not Taking  . Doxylamine-Pyridoxine (DICLEGIS) 10-10 MG TBEC 1 tab in AM, 1 tab mid afternoon 2 tabs at bedtime. Max dose 4 tabs daily. 100 tablet 5 Taking  . Elastic Bandages & Supports (COMFORT FIT MATERNITY SUPP LG) MISC 1 Units by Does not apply route daily as needed. (Patient not taking: Reported on 05/24/2018) 1 each 0 Not Taking  . ferrous sulfate (SLOW IRON) 160 (50 Fe) MG TBCR SR tablet Take 1 tablet (160 mg total) by mouth every 3 (three) days. 30 each 0 Taking  . gabapentin (NEURONTIN) 300 MG capsule Take 1 capsule (300 mg total) by mouth 3 (three) times daily as needed. (Patient not taking: Reported on  05/24/2018) 30 capsule 1 Not Taking  . labetalol (NORMODYNE) 100 MG tablet Take 1 tablet (100 mg total) by mouth 2 (two) times daily. (Patient not taking: Reported on 06/05/2018) 60 tablet 0 Not Taking  . Prenatal Vit-Fe Fumarate-FA (PRENATAL MULTIVITAMIN) TABS tablet Take 1 tablet by mouth daily at 12 noon.   Taking  . Vitamin D, Ergocalciferol, (DRISDOL) 50000 units CAPS capsule Take 1 capsule (50,000 Units total) by mouth every 7 (seven) days. (Patient not taking: Reported on 05/31/2018) 30 capsule 2 Not Taking    Review of Systems  Eyes: Positive for visual disturbance.  Respiratory: Positive for shortness of breath.   Cardiovascular: Negative for chest pain.  Gastrointestinal: Negative for abdominal pain.  Genitourinary: Negative for vaginal bleeding.  Neurological: Positive for headaches.   Physical Exam   Blood pressure (!) 145/88, pulse  92, resp. rate 16, weight 88.9 kg, last menstrual period 09/25/2017, SpO2 99 %, unknown if currently breastfeeding.  Patient Vitals for the past 24 hrs:  BP Pulse Resp SpO2 Weight  06/12/18 1246 (!) 158/102 79 - - -  06/12/18 1231 (!) 169/107 89 - - -  06/12/18 1216 (!) 153/91 79 - - -  06/12/18 1201 (!) 140/93 75 - - -  06/12/18 1146 137/90 75 - - -  06/12/18 1131 136/83 79 - - -  06/12/18 1116 (!) 142/91 76 - - -  06/12/18 1101 (!) 145/88 92 - - -  06/12/18 1058 (!) 136/99 78 16 99 % -  06/12/18 1050 - - - - 88.9 kg    Physical Exam  Constitutional: She is oriented to person, place, and time. She appears well-developed and well-nourished. No distress.  HENT:  Head: Normocephalic and atraumatic.  Neck: Normal range of motion.  Cardiovascular: Normal rate.  Respiratory: Effort normal. No respiratory distress.  GI: Soft. She exhibits no distension. There is no tenderness.  Musculoskeletal: Normal range of motion.  Neurological: She is alert and oriented to person, place, and time.  Skin: Skin is warm and dry.  Psychiatric: She has a normal mood and affect.  EFM: 135 bpm, mod variability, + accels, no decels Toco: none  Results for orders placed or performed during the hospital encounter of 06/12/18 (from the past 24 hour(s))  Comprehensive metabolic panel     Status: Abnormal   Collection Time: 06/12/18 11:06 AM  Result Value Ref Range   Sodium 136 135 - 145 mmol/L   Potassium 3.4 (L) 3.5 - 5.1 mmol/L   Chloride 107 98 - 111 mmol/L   CO2 21 (L) 22 - 32 mmol/L   Glucose, Bld 82 70 - 99 mg/dL   BUN 9 6 - 20 mg/dL   Creatinine, Ser 1.610.48 0.44 - 1.00 mg/dL   Calcium 8.5 (L) 8.9 - 10.3 mg/dL   Total Protein 6.8 6.5 - 8.1 g/dL   Albumin 2.8 (L) 3.5 - 5.0 g/dL   AST 31 15 - 41 U/L   ALT 27 0 - 44 U/L   Alkaline Phosphatase 94 38 - 126 U/L   Total Bilirubin 0.7 0.3 - 1.2 mg/dL   GFR calc non Af Amer >60 >60 mL/min   GFR calc Af Amer >60 >60 mL/min   Anion gap 8 5 - 15  CBC      Status: Abnormal   Collection Time: 06/12/18 11:06 AM  Result Value Ref Range   WBC 10.4 4.0 - 10.5 K/uL   RBC 3.98 3.87 - 5.11 MIL/uL   Hemoglobin 11.3 (L) 12.0 -  15.0 g/dL   HCT 16.1 (L) 09.6 - 04.5 %   MCV 85.9 80.0 - 100.0 fL   MCH 28.4 26.0 - 34.0 pg   MCHC 33.0 30.0 - 36.0 g/dL   RDW 40.9 81.1 - 91.4 %   Platelets 213 150 - 400 K/uL   nRBC 0.0 0.0 - 0.2 %  Protein / creatinine ratio, urine     Status: None   Collection Time: 06/12/18 11:21 AM  Result Value Ref Range   Creatinine, Urine 134.00 mg/dL   Total Protein, Urine 19 mg/dL   Protein Creatinine Ratio 0.14 0.00 - 0.15 mg/mg[Cre]    MAU Course  Procedures  MDM Labs ordered and reviewed. Consult with Dr. Vergie Living, recommends IOL d/t worsening sx and BPs.   Assessment and Plan  [redacted] weeks gestation NST reactive  CHTN w/superimposed PEC  Admit to BS BMZ Mngt per labor team  Donette Larry 06/12/2018, 11:14 AM

## 2018-06-12 NOTE — ED Notes (Signed)
Pt reports slight headache and SOB that started this am.  Pulse ox 100% on room air.

## 2018-06-13 ENCOUNTER — Encounter (HOSPITAL_COMMUNITY): Payer: Self-pay

## 2018-06-13 ENCOUNTER — Encounter: Payer: Self-pay | Admitting: Obstetrics and Gynecology

## 2018-06-13 DIAGNOSIS — O1414 Severe pre-eclampsia complicating childbirth: Secondary | ICD-10-CM

## 2018-06-13 DIAGNOSIS — O1410 Severe pre-eclampsia, unspecified trimester: Secondary | ICD-10-CM

## 2018-06-13 DIAGNOSIS — Z3A35 35 weeks gestation of pregnancy: Secondary | ICD-10-CM

## 2018-06-13 LAB — CBC
HEMATOCRIT: 35.4 % — AB (ref 36.0–46.0)
Hemoglobin: 11.6 g/dL — ABNORMAL LOW (ref 12.0–15.0)
MCH: 28.4 pg (ref 26.0–34.0)
MCHC: 32.8 g/dL (ref 30.0–36.0)
MCV: 86.8 fL (ref 80.0–100.0)
Platelets: 247 10*3/uL (ref 150–400)
RBC: 4.08 MIL/uL (ref 3.87–5.11)
RDW: 14 % (ref 11.5–15.5)
WBC: 19.7 10*3/uL — AB (ref 4.0–10.5)
nRBC: 0 % (ref 0.0–0.2)

## 2018-06-13 LAB — RPR: RPR Ser Ql: NONREACTIVE

## 2018-06-13 MED ORDER — BENZOCAINE-MENTHOL 20-0.5 % EX AERO
1.0000 "application " | INHALATION_SPRAY | CUTANEOUS | Status: DC | PRN
  Filled 2018-06-13: qty 56

## 2018-06-13 MED ORDER — DIPHENHYDRAMINE HCL 25 MG PO CAPS: 25.0000 mg | ORAL_CAPSULE | Freq: Four times a day (QID) | ORAL | Status: DC | PRN

## 2018-06-13 MED ORDER — FERROUS SULFATE 325 (65 FE) MG PO TABS
325.0000 mg | ORAL_TABLET | Freq: Every day | ORAL | Status: DC
  Administered 2018-06-13 – 2018-06-15 (×3): 325 mg via ORAL
  Filled 2018-06-13 (×3): qty 1

## 2018-06-13 MED ORDER — ACETAMINOPHEN 325 MG PO TABS
650.0000 mg | ORAL_TABLET | ORAL | Status: DC | PRN
  Administered 2018-06-14 (×2): 650 mg via ORAL
  Filled 2018-06-13 (×2): qty 2

## 2018-06-13 MED ORDER — ZOLPIDEM TARTRATE 5 MG PO TABS: 5.0000 mg | ORAL_TABLET | Freq: Every evening | ORAL | Status: DC | PRN

## 2018-06-13 MED ORDER — PRENATAL MULTIVITAMIN CH
1.0000 | ORAL_TABLET | Freq: Every day | ORAL | Status: DC
  Administered 2018-06-13 – 2018-06-14 (×2): 1 via ORAL
  Filled 2018-06-13 (×2): qty 1

## 2018-06-13 MED ORDER — SIMETHICONE 80 MG PO CHEW: 80.0000 mg | CHEWABLE_TABLET | ORAL | Status: DC | PRN

## 2018-06-13 MED ORDER — COCONUT OIL OIL
1.0000 "application " | TOPICAL_OIL | Status: DC | PRN
  Filled 2018-06-13: qty 120

## 2018-06-13 MED ORDER — IBUPROFEN 600 MG PO TABS
600.0000 mg | ORAL_TABLET | Freq: Four times a day (QID) | ORAL | Status: DC
  Administered 2018-06-13 – 2018-06-15 (×9): 600 mg via ORAL
  Filled 2018-06-13 (×9): qty 1

## 2018-06-13 MED ORDER — ONDANSETRON HCL 4 MG PO TABS: 4.0000 mg | ORAL_TABLET | ORAL | Status: DC | PRN

## 2018-06-13 MED ORDER — SENNOSIDES-DOCUSATE SODIUM 8.6-50 MG PO TABS
2.0000 | ORAL_TABLET | ORAL | Status: DC
  Administered 2018-06-13 – 2018-06-15 (×2): 2 via ORAL
  Filled 2018-06-13 (×2): qty 2

## 2018-06-13 MED ORDER — WITCH HAZEL-GLYCERIN EX PADS: 1.0000 "application " | MEDICATED_PAD | CUTANEOUS | Status: DC | PRN

## 2018-06-13 MED ORDER — ONDANSETRON HCL 4 MG/2ML IJ SOLN: 4.0000 mg | INTRAMUSCULAR | Status: DC | PRN

## 2018-06-13 MED ORDER — DIBUCAINE 1 % RE OINT: 1.0000 "application " | TOPICAL_OINTMENT | RECTAL | Status: DC | PRN

## 2018-06-13 MED ORDER — LACTATED RINGERS AMNIOINFUSION
INTRAVENOUS | Status: DC
  Administered 2018-06-13: 01:00:00 via INTRAUTERINE
  Filled 2018-06-13 (×2): qty 1000

## 2018-06-13 MED ORDER — LACTATED RINGERS IV SOLN
INTRAVENOUS | Status: DC
  Administered 2018-06-13 (×2): via INTRAVENOUS

## 2018-06-13 MED ORDER — TETANUS-DIPHTH-ACELL PERTUSSIS 5-2.5-18.5 LF-MCG/0.5 IM SUSP: 0.5000 mL | Freq: Once | INTRAMUSCULAR | Status: DC

## 2018-06-13 NOTE — Anesthesia Postprocedure Evaluation (Signed)
Anesthesia Post Note  Patient: Brittany SearStacy [name]  Procedure(s) Performed: AN AD HOC LABOR EPIDURAL     Patient location during evaluation: [facility] Anesthesia Type: Epidural Level of consciousness: awake, awake and alert, oriented and patient cooperative Pain management: pain level controlled Vital Signs Assessment: post-procedure vital signs reviewed and stable Respiratory status: spontaneous breathing, nonlabored ventilation and respiratory function stable Cardiovascular status: stable Postop Assessment: no headache, no backache, patient able to bend at knees, no apparent nausea or vomiting and able to ambulate Anesthetic complications: no    Last Vitals:  Vitals:   06/13/18 0753 06/13/18 1202  BP: (!) 130/94 (!) 134/97  Pulse: 87 87  Resp: 16 16  Temp: 36.4 C 36.6 C  SpO2: 100% 99%    Last Pain:  Vitals:   06/13/18 1202  TempSrc: Oral  PainSc:    Pain Goal: Patients Stated Pain Goal: 3 (06/13/18 0645)               Charnel Giles L

## 2018-06-13 NOTE — Progress Notes (Signed)
Brittany [name] Brittany [name] is a 37 y.o. [phone] [id number] at 2035w2d by LMP admitted for induction of labor due to Chronic Hypertension, AMA, Hyperthyroid, and h/o FGR.  Subjective: Called to room by Darrow BussingLiz Poore, RN for FHR in 50s and increased vaginal bleeding. O2 via FM.   Objective: BP 132/80   Pulse 86   Temp 97.8 F (36.6 C) (Oral)   Resp 20   Ht 5\' 5"  (1.651 m)   Wt 88 kg   LMP 09/25/2017   SpO2 100%   BMI 32.28 kg/m  I/O last 3 completed shifts: In: 1255 [P.O.:480; I.V.:425; IV Piggyback:350] Out: 125 [Urine:125] Total I/O In: 1128.5 [P.O.:580; I.V.:448.5; IV Piggyback:100] Out: 575 [Urine:575]  FHT:  FHR: 117 bpm, variability: moderate,  accelerations:  Abscent,  decelerations:  Present prolonged down to 50-60 bpm UC:   regular, every 1-4 minutes SVE:   Dilation: 6 Effacement (%): 70 Station: -2, -3 Exam by:: R Ronie Fleeger CNM AROM with small amount of clear fluid in return IUPC placed without difficulty FSE placed without difficulty  Labs: Lab Results  Component Value Date   WBC 15.0 (H) 06/12/2018   HGB 12.1 06/12/2018   HCT 36.1 06/12/2018   MCV 86.4 06/12/2018   PLT 234 06/12/2018    Assessment / Plan: Induction of labor due to cHTN,  progressing well on pitocin  Labor: Progressing without Pitocin Preeclampsia:  on magnesium sulfate, no signs or symptoms of toxicity and labs stable Fetal Wellbeing:  Category I and Category II Pain Control:  Epidural I/D:  n/a Anticipated MOD:  NSVD  [name], CNM 06/13/2018, 1:44 AM

## 2018-06-13 NOTE — Lactation Note (Signed)
This note was copied from a baby's chart. Lactation Consultation Note  Patient Name: Girl Vallarie MareStacy Schafer ZHYQM'VToday's Date: 06/13/2018 Reason for consult: Initial assessment;Late-preterm 34-36.6wks;Infant < 6lbs  Breastfeeding consultation services and support information given to patient.  Caring For Your Late Preterm Baby handout given and reviewed.  Mom is a P4 and breastfed previous babies without difficulty. Discussed normal late preterm feeding and explained that we don't expect good milk transfer until baby reaches term.  Newborn is 7 hours old.  Baby has been to breast 3 times.  Discussed importance of formula supplementation every 3 hours until milk comes to volume and she can supplement with her milk.  Mom agreeable.  Assisted with paced bottle feeding.  Baby not showing interest in feeding and took 3 mls.  Symphony pump set up and initiated.  Instructed to pump and hand express every 3 hours.  Plan is to attempt breastfeeding with cues or every 3 hours limiting feedings to 30 minutes, supplement with expressed milk and 10 mls of neosure every 3 hours and pump both breasts x 15 minutes.  Encouraged to call for assist/concerns prn.   Maternal Data    Feeding Feeding Type: Formula Nipple Type: Slow - flow  LATCH Score                   Interventions    Lactation Tools Discussed/Used     Consult Status Consult Status: Follow-up Date: 06/14/18 Follow-up type: In-patient    Huston FoleyMOULDEN, Leahann Lempke S 06/13/2018, 11:15 AM

## 2018-06-14 MED ORDER — HYDRALAZINE HCL 20 MG/ML IJ SOLN: 10.0000 mg | INTRAMUSCULAR | Status: DC | PRN

## 2018-06-14 MED ORDER — HYDRALAZINE HCL 20 MG/ML IJ SOLN: 5.0000 mg | INTRAMUSCULAR | Status: DC | PRN

## 2018-06-14 MED ORDER — AMLODIPINE BESYLATE 10 MG PO TABS
10.0000 mg | ORAL_TABLET | Freq: Every day | ORAL | Status: DC
  Administered 2018-06-14 – 2018-06-15 (×2): 10 mg via ORAL
  Filled 2018-06-14 (×2): qty 1

## 2018-06-14 MED ORDER — LABETALOL HCL 5 MG/ML IV SOLN: 20.0000 mg | INTRAVENOUS | Status: DC | PRN

## 2018-06-14 MED ORDER — LABETALOL HCL 5 MG/ML IV SOLN: 40.0000 mg | INTRAVENOUS | Status: DC | PRN

## 2018-06-14 NOTE — Progress Notes (Signed)
Post Partum Day 1 s/p svd, p IOL for CHTN with SIPE,, now s/p mag sulfate x 24 hr, d/c'd this am Subjective: no complaints and up ad lib no headaches,   Objective: Blood pressure 119/87, pulse 86, temperature 98.7 F (37.1 C), temperature source Oral, resp. rate 17, height 5\' 5"  (1.651 m), weight 88 kg, last menstrual period 09/25/2017, SpO2 98 %, unknown if currently breastfeeding.  Physical Exam:  General: alert, cooperative and no distress Lochia: appropriate Uterine Fundus: firm Incision:  DVT Evaluation: No evidence of DVT seen on physical exam. Negative Homan's sign.  Recent Labs    06/12/18 1943 06/13/18 0504  HGB 12.1 11.6*  HCT 36.1 35.4*    Assessment/Plan: Plan for discharge tomorrow   LOS: 2 days   Brittany BurrowJohn V Apurva [name] 06/14/2018, 7:38 AM

## 2018-06-14 NOTE — Lactation Note (Signed)
This note was copied from a baby's chart. Lactation Consultation Note  Patient Name: Girl Vallarie MareStacy Kratz ZOXWR'UToday's Date: 06/14/2018 Reason for consult: Follow-up assessment;Late-preterm 34-36.6wks;Infant weight loss;Infant < 6lbs  Visited with P4 Mom of LPTI at 631 hrs old.  Baby at 6% weight loss today.  Mom concerned that baby isn't able to sustain a deep latch.  [facility] swadd[facility]  Reviewed importance of STS when feeding baby, to keep baby more stimulated to actively feed.  Baby opened widely to latch, but quickly slipped onto nipple.  Mom pushing nipple into baby's mouth.  Offered to assist with bringing baby to breast.  Initiated a 20 mm nipple shield, with instructions on how to use and care for.  Nipple pulled well into shield.  Baby latched and fed with swallows identified.  Mom taught to use alternate breast compression to increase milk transfer.  20 ml of EBM in fridge, warmed and Mom to offer this after baby finishes the breast.  Mom knows to limit to 30 mins at each feeding, to avoid over tiring baby.    Disassembled pump parts and washed with warm water, rinsing well and air drying them in basin.   Plan- 1- Keep baby STS as much as possible 2- Awaken baby well for feeding, offering the breast first, using nipple shield to help baby attain depth on breast 3- Follow breast with 10-20 ml of EBM+/formula (22 cal) 4- Pump both breasts on Initiation setting, adding breast massage and hand expression. 5- Ask for help prn.  Mom has WIC, faxed referral for DEBP at discharge.  Mom aware of our pump loaner program and $30 deposit needed.   Interventions Interventions: Breast feeding basics reviewed;Assisted with latch;Skin to skin;Breast massage;Hand express;Expressed milk;Position options;Support pillows;Adjust position;Breast compression;DEBP  Lactation Tools Discussed/Used Tools: Pump;Nipple Dorris CarnesShields;Bottle Nipple shield size: 20 Breast pump type: Double-Electric Breast  Pump WIC Program: Yes Pump Review: Setup, frequency, and cleaning;Milk Storage Date initiated:: 06/13/18   Consult Status Consult Status: Follow-up Date: 06/15/18 Follow-up type: In-patient    Judee ClaraSmith, Bryella Diviney E 06/14/2018, 11:38 AM

## 2018-06-14 NOTE — Progress Notes (Signed)
Notified of unexpectedly high BP 180 systolic at 9 am, 6 hrs p d/c of mag.  Recheck 140'/92 will begin on Norvasc 10 d at 10 am.

## 2018-06-15 ENCOUNTER — Ambulatory Visit: Payer: Self-pay

## 2018-06-15 MED ORDER — IBUPROFEN 600 MG PO TABS: 600.0000 mg | ORAL_TABLET | Freq: Four times a day (QID) | ORAL | 0 refills | Status: DC

## 2018-06-15 MED ORDER — AMLODIPINE BESYLATE 10 MG PO TABS: 10.0000 mg | ORAL_TABLET | Freq: Every day | ORAL | 2 refills | Status: DC

## 2018-06-15 MED ORDER — OXYCODONE-ACETAMINOPHEN 5-325 MG PO TABS: 1.0000 | ORAL_TABLET | Freq: Four times a day (QID) | ORAL | 0 refills | Status: DC | PRN

## 2018-06-15 NOTE — Discharge Summary (Signed)
Physician Discharge Summary  Patient ID: Brittany [name] MRN: [id number] DOB/AGE: 25-Apr-1981 37 y.o.  Admit date: 06/12/2018 Discharge date: 06/15/2018   Discharge Diagnoses:  Principal Problem:   Chronic hypertension with superimposed preeclampsia Active Problems:   Subclinical hyperthyroidism   Migraine   Murmur, cardiac   Supervision of high risk pregnancy, antepartum   H/O LEEP   H/O intrauterine growth restriction in prior pregnancy, currently pregnant   H/O pre-eclampsia in prior pregnancy, currently pregnant   H/O preterm delivery, currently pregnant   Proteinuria affecting pregnancy   Encounter for induction of labor   Hospital Course: Please see HPI dated 06/12/2018 for details. This is a 37 y.o. [id number] now PPD#2 from a spontaneous vaginal delivery. She presented for induction of labor for chronic hypertension with superimposed preeclampsia with severe features. Her antepartum course was complicated by chronic HTN, recurrent headaches, depression, subclinical hyperthyroidism. Her post partum course was uncomplicated. She had 24 hrs MgSO4 treatment post partum. By day 2, she was ambulating, passing flatus and pain was well controlled. She was started on norvasc 10 mg daily to good effect and will be discharged home on this. She was discharged home PPD#2 in good condition. Baby is doing well and will be discharged home with patient. Instructions for follow up given, message sent for BP check in 4-5 days.   Physical exam  Vitals:   06/14/18 1622 06/14/18 2015 06/14/18 2324 06/15/18 0502  BP: 124/77 134/84 137/86 (!) 145/88  Pulse: 84 (!) 102 85 76  Resp: 18 20 20 20   Temp: 98.8 F (37.1 C) 98.8 F (37.1 C) 99.1 F (37.3 C) 98.3 F (36.8 C)  TempSrc: Oral Oral Oral Oral  SpO2: 97% 97% 96% 99%  Weight:      Height:       Physical Exam:  General: alert, oriented, cooperative Chest: LCTAB, normal respiratory effort Heart: RRR  Abdomen: +BS, soft, appropriately  tender to palpation  Uterine Fundus: firm, 2 fingers below the umbilicus Lochia: moderate, rubra DVT Evaluation: no evidence of DVT Extremities: no edema, no calf tenderness   Postpartum contraception: Nexplanon  Disposition: home  Discharged Condition: good  Discharge Instructions    Call MD for:  difficulty breathing, headache or visual disturbances   Complete by:  As directed    Call MD for:  extreme fatigue   Complete by:  As directed    Call MD for:  hives   Complete by:  As directed    Call MD for:  persistant dizziness or light-headedness   Complete by:  As directed    Call MD for:  persistant nausea and vomiting   Complete by:  As directed    Call MD for:  redness, tenderness, or signs of infection (pain, swelling, redness, odor or green/yellow discharge around incision site)   Complete by:  As directed    Call MD for:  severe uncontrolled pain   Complete by:  As directed    Call MD for:  temperature >100.4   Complete by:  As directed    Diet - low sodium heart healthy   Complete by:  As directed      Allergies as of 06/15/2018   No Known Allergies     Medication List    STOP taking these medications   acetaminophen 500 MG tablet Commonly known as:  TYLENOL   aspirin EC 81 MG tablet   butalbital-acetaminophen-caffeine 50-325-40 MG tablet Commonly known as:  FIORICET, ESGIC   COMFORT FIT MATERNITY  SUPP LG Misc   cyclobenzaprine 10 MG tablet Commonly known as:  FLEXERIL   Doxylamine-Pyridoxine 10-10 MG Tbec   ferrous sulfate 160 (50 Fe) MG Tbcr SR tablet Commonly known as:  SLOW FE   gabapentin 300 MG capsule Commonly known as:  NEURONTIN   labetalol 100 MG tablet Commonly known as:  NORMODYNE   Vitamin D (Ergocalciferol) 1.25 MG (50000 UT) Caps capsule Commonly known as:  DRISDOL     TAKE these medications   amLODipine 10 MG tablet Commonly known as:  NORVASC Take 1 tablet (10 mg total) by mouth daily.   ibuprofen 600 MG  tablet Commonly known as:  ADVIL,MOTRIN Take 1 tablet (600 mg total) by mouth every 6 (six) hours.   oxyCODONE-acetaminophen 5-325 MG tablet Commonly known as:  PERCOCET/ROXICET Take 1 tablet by mouth every 6 (six) hours as needed.   prenatal multivitamin Tabs tablet Take 1 tablet by mouth daily at 12 noon.      Follow-up Information    [facility]. Go on 06/19/2018.   Specialty:  Obstetrics and Gynecology Contact information: [address], [address] [place] [address] [phone]           Signed: Baldemar Lenis, M.D. Attending Center for [employer] (Faculty Practice)  06/15/2018, 7:17 AM

## 2018-06-15 NOTE — Progress Notes (Signed)
CSW received consult for hx of Anxiety and Depression.  CSW met with MOB to offer support and complete assessment.    When CSW arrived, MOB was bonding with infant as evidence by engaging in infant massages.  There were 3 other children in the room and MOB made reference to them as her children.  There was also a female person asleep on the couch that MOB did identify.  CSW offered to return at a later time but MOB was adamant that CSW meet with MOB at this time.  CSW explained CSW's role and MOB gave CSW permission to complete the assessment while MOB's guest were present. MOB was polite and receptive to meeting with CSW.  CSW asked about MOB's MH hx and MOB reported an increase in feelings of sadness and being overwhelmed for the past 3 months.  MOB attributed her symtoms to "daily life stressors." CSW assessed for what stressors MOB was making reference to however, MOB responses became vague and MOB was unable to specify.   CSW provided education regarding the baby blues period vs. perinatal mood disorders, discussed treatment and gave resources for mental health follow up if concerns arise.  CSW recommends self-evaluation during the postpartum time period using the New Mom Checklist from Postpartum Progress and encouraged MOB to contact a medical professional if symptoms are noted at any time. MOB presented with insight and awareness and did not demonstrate any acute MH symtoms. CSW assessed for SI and HI and MOB denied them both.  MOB reported that MOB has a good support team and feels prepared to parent infant. MOB was receptive to resources for outpatient counseling and agreed to seek help if warranted.    MOB Edinburgh score was 0.  CSW identifies no further need for intervention and no barriers to discharge at this time.  Laurey Arrow, MSW, LCSW Clinical Social Work [phone]

## 2018-06-15 NOTE — Lactation Note (Signed)
This note was copied from a baby's chart. Lactation Consultation Note; Mom reports baby has just finished nursing for 15 min. Is supplementing with EBM by bottle after nursing. Reports her breasts are feeling fuller this morning. Has WIC [facility] and mom states she will let me know. Mom states she knows how to use pump pieces as manual pump. No questions at present. To call prn  Patient Name: Brittany [name]'s Date: 06/15/2018 Reason for consult: Follow-up assessment;Late-preterm 34-36.6wks   Maternal Data Formula Feeding for Exclusion: No Has patient been taught Hand Expression?: Yes Does the patient have breastfeeding experience prior to this delivery?: Yes  Feeding Feeding Type: Breast Fed  LATCH Score                   Interventions    Lactation Tools Discussed/Used WIC Program: Yes   Consult Status Consult Status: Complete    Pamelia HoitWeeks, Cassady Turano D 06/15/2018, 9:08 AM

## 2018-06-15 NOTE — Lactation Note (Signed)
This note was copied from a baby's chart. Lactation Consultation Note Baby 2164 hrs old. LC spoke w/mom regarding increasing supplementing amount. Mom had been giving 10 ml. Baby needs at 64 hrs old 20-30 ml of supplementation after BF. Mom has 22 cal. Similac at bedside. Mom stated she hadn't been getting the formula because she has stored BM in ref. For the baby and mom is pumping as well. Praised mom. Asked mom if she has LPI information sheet, mom stated yes. Encouraged mom to call for assistance or questions. Mom has three children in her room that she is caring for w/no support person at bedside.  Patient Name: Girl Vallarie MareStacy Mcconville ZOXWR'UToday's Date: 06/15/2018 Reason for consult: Follow-up assessment;Late-preterm 34-36.6wks;Infant < 6lbs   Maternal Data    Feeding Feeding Type: Bottle Fed - Breast Milk Nipple Type: Slow - flow  LATCH Score                   Interventions    Lactation Tools Discussed/Used     Consult Status Consult Status: Follow-up Date: 06/16/18 Follow-up type: In-patient    Charyl DancerCARVER, Rayetta Veith G 06/15/2018, 8:52 PM

## 2018-06-15 NOTE — Progress Notes (Signed)
Discharge instructions given to patient.  Discussed postpartum care.  Reviewed signs of hypertension and importance of continuing anti-hypertensive. Patient verbalized understanding.   Patient to stay in room as baby is still a patient.

## 2018-06-19 ENCOUNTER — Ambulatory Visit (HOSPITAL_COMMUNITY): Admission: RE | Admit: 2018-06-19 | Payer: Medicaid Other | Source: Ambulatory Visit

## 2018-06-26 ENCOUNTER — Ambulatory Visit (HOSPITAL_COMMUNITY): Payer: Medicaid Other

## 2018-07-02 ENCOUNTER — Ambulatory Visit: Payer: Medicaid Other

## 2018-07-02 VITALS — BP 138/82 | HR 79

## 2018-07-02 DIAGNOSIS — Z0130 Encounter for examination of blood pressure without abnormal findings: Secondary | ICD-10-CM

## 2018-07-02 NOTE — Progress Notes (Signed)
Pt is here for BP check. Pt delivered 06/13/18 NSVD at 4940w2d. Pt is currently taking Norvasc 10mg  once daily. BP checked and reviewed with Dr. Jolayne Pantheronstant, advised pt to make her normal post partum visit and continue taking her meds. Pt verbalized understanding.

## 2018-07-16 ENCOUNTER — Ambulatory Visit (INDEPENDENT_AMBULATORY_CARE_PROVIDER_SITE_OTHER): Payer: Medicaid Other | Admitting: Obstetrics & Gynecology

## 2018-07-16 ENCOUNTER — Other Ambulatory Visit: Payer: Self-pay

## 2018-07-16 ENCOUNTER — Encounter: Payer: Self-pay | Admitting: Obstetrics & Gynecology

## 2018-07-16 VITALS — BP 127/85 | HR 70 | Ht 65.0 in | Wt 173.0 lb

## 2018-07-16 DIAGNOSIS — Z30017 Encounter for initial prescription of implantable subdermal contraceptive: Secondary | ICD-10-CM | POA: Diagnosis not present

## 2018-07-16 DIAGNOSIS — Z3202 Encounter for pregnancy test, result negative: Secondary | ICD-10-CM

## 2018-07-16 DIAGNOSIS — Z1389 Encounter for screening for other disorder: Secondary | ICD-10-CM

## 2018-07-16 DIAGNOSIS — E0590 Thyrotoxicosis, unspecified without thyrotoxic crisis or storm: Secondary | ICD-10-CM

## 2018-07-16 LAB — POCT URINE PREGNANCY: PREG TEST UR: NEGATIVE

## 2018-07-16 MED ORDER — ETONOGESTREL 68 MG ~~LOC~~ IMPL
68.0000 mg | DRUG_IMPLANT | Freq: Once | SUBCUTANEOUS | Status: AC
  Administered 2018-07-16: 68 mg via SUBCUTANEOUS

## 2018-07-16 NOTE — Progress Notes (Signed)
  GYNECOLOGY OFFICE PROCEDURE NOTE  Brittany [name] is a 37 y.o. [phone] here for Nexplanon insertion.  Last pap smear was on 2019 and was normal.  No other gynecologic concerns. She abstained since delivery and will continue for at least 1 week after insertion   Nexplanon Insertion Procedure Patient identified, informed consent performed, consent signed.   Patient does understand that irregular bleeding is a very common side effect of this medication. She was advised to have backup contraception for one week after placement. Pregnancy test in clinic today was negative.  Appropriate time out taken.  Patient's left arm was prepped and draped in the usual sterile fashion. The ruler used to measure and mark insertion area.  Patient was prepped with alcohol swab and then injected with 3 ml of 1% lidocaine.  She was prepped with betadine, Nexplanon removed from packaging,  Device confirmed in needle, then inserted full length of needle and withdrawn per handbook instructions. Nexplanon was able to palpated in the patient's arm; patient palpated the insert herself. There was minimal blood loss.  Patient insertion site covered with gauze and a pressure bandage to reduce any bruising.  The patient tolerated the procedure well and was given post procedure instructions.       Adam PhenixArnold, James G, MD Attending Obstetrician & Gynecologist, [facility]  07/16/2018

## 2018-07-16 NOTE — Progress Notes (Signed)
UPT today is NEGATIVE.  Administrations This Visit    etonogestrel (NEXPLANON) implant 68 mg    Admin Date 07/16/2018 Action Given Dose 68 mg Route Subdermal Administered By Maretta BeesMcGlashan, Carol J, RMA         Subjective:     Brittany [name] is a 37 y.o. female who presents for a postpartum visit. She is 4 weeks postpartum following a spontaneous vaginal delivery. I have fully reviewed the prenatal and intrapartum course. The delivery was at 35.2 gestational weeks. Outcome: spontaneous vaginal delivery. Anesthesia: epidural. Postpartum course has been Unremarkable. Baby's course has been Unremarkable. Baby is feeding by breast. Bleeding no bleeding. Bowel function is normal. Bladder function is normal. Patient is not sexually active. Contraception method is none. Postpartum depression screening: negative.  The following portions of the patient's history were reviewed and updated as appropriate: allergies, current medications, past family history, past medical history, past social history, past surgical history and problem list.  Review of Systems Pertinent items are noted in HPI.   Objective:    BP 127/85 (BP Location: Left Arm, Cuff Size: Normal)   Pulse 70   Ht 5\' 5"  (1.651 m)   Wt 173 lb (78.5 kg)   LMP 09/25/2017   Breastfeeding Yes   BMI 28.79 kg/m   General:  alert, cooperative and no distress           Abdomen: soft, non-tender; bowel sounds normal; no masses,  no organomegaly   Vulva:  not evaluated  Vagina: not evaluated                    Assessment:     normal postpartum exam. Pap smear not done at today's visit.   Plan:    1. Contraception: Nexplanon 2. Abstain for 1-2 weeks after Nexplanon 3. Follow up as needed.    Adam PhenixArnold, [place] G, MD 07/16/2018

## 2018-07-16 NOTE — Patient Instructions (Signed)
Etonogestrel implant What is this medicine? ETONOGESTREL (et oh noe JES trel) is a contraceptive (birth control) device. It is used to prevent pregnancy. It can be used for up to 3 years. This medicine may be used for other purposes; ask your health care provider or pharmacist if you have questions. COMMON BRAND NAME(S): Implanon, Nexplanon What should I tell my health care provider before I take this medicine? They need to know if you have any of these conditions: -abnormal vaginal bleeding -blood vessel disease or blood clots -breast, cervical, endometrial, ovarian, liver, or uterine cancer -diabetes -gallbladder disease -heart disease or recent heart attack -high blood pressure -high cholesterol or triglycerides -kidney disease -liver disease -migraine headaches -seizures -stroke -tobacco smoker -an unusual or allergic reaction to etonogestrel, anesthetics or antiseptics, other medicines, foods, dyes, or preservatives -pregnant or trying to get pregnant -breast-feeding How should I use this medicine? This device is inserted just under the skin on the inner side of your upper arm by a health care professional. Talk to your pediatrician regarding the use of this medicine in children. Special care may be needed. Overdosage: If you think you have taken too much of this medicine contact a poison control center or emergency room at once. NOTE: This medicine is only for you. Do not share this medicine with others. What if I miss a dose? This does not apply. What may interact with this medicine? Do not take this medicine with any of the following medications: -amprenavir -fosamprenavir This medicine may also interact with the following medications: -acitretin -aprepitant -armodafinil -bexarotene -bosentan -carbamazepine -certain medicines for fungal infections like fluconazole, ketoconazole, itraconazole and voriconazole -certain medicines to treat hepatitis, HIV or  AIDS -cyclosporine -felbamate -griseofulvin -lamotrigine -modafinil -oxcarbazepine -phenobarbital -phenytoin -primidone -rifabutin -rifampin -rifapentine -St. John's wort -topiramate This list may not describe all possible interactions. Give your health care provider a list of all the medicines, herbs, non-prescription drugs, or dietary supplements you use. Also tell them if you smoke, drink alcohol, or use illegal drugs. Some items may interact with your medicine. What should I watch for while using this medicine? This product does not protect you against HIV infection (AIDS) or other sexually transmitted diseases. You should be able to feel the implant by pressing your fingertips over the skin where it was inserted. Contact your doctor if you cannot feel the implant, and use a non-hormonal birth control method (such as condoms) until your doctor confirms that the implant is in place. Contact your doctor if you think that the implant may have broken or become bent while in your arm. You will receive a user card from your health care provider after the implant is inserted. The card is a record of the location of the implant in your upper arm and when it should be removed. Keep this card with your health records. What side effects may I notice from receiving this medicine? Side effects that you should report to your doctor or health care professional as soon as possible: -allergic reactions like skin rash, itching or hives, swelling of the face, lips, or tongue -breast lumps, breast tissue changes, or discharge -breathing problems -changes in emotions or moods -if you feel that the implant may have broken or bent while in your arm -high blood pressure -pain, irritation, swelling, or bruising at the insertion site -scar at site of insertion -signs of infection at the insertion site such as fever, and skin redness, pain or discharge -signs and symptoms of a blood clot such   as breathing  problems; changes in vision; chest pain; severe, sudden headache; pain, swelling, warmth in the leg; trouble speaking; sudden numbness or weakness of the face, arm or leg -signs and symptoms of liver injury like dark yellow or brown urine; general ill feeling or flu-like symptoms; light-colored stools; loss of appetite; nausea; right upper belly pain; unusually weak or tired; yellowing of the eyes or skin -unusual vaginal bleeding, discharge Side effects that usually do not require medical attention (report to your doctor or health care professional if they continue or are bothersome): -acne -breast pain or tenderness -headache -irregular menstrual bleeding -nausea This list may not describe all possible side effects. Call your doctor for medical advice about side effects. You may report side effects to FDA at 1-800-FDA-1088. Where should I keep my medicine? This drug is given in a hospital or clinic and will not be stored at home. NOTE: This sheet is a summary. It may not cover all possible information. If you have questions about this medicine, talk to your doctor, pharmacist, or health care provider.  2019 Elsevier/Gold Standard (2017-05-23 14:11:42) Nexplanon Instructions After Insertion   Keep bandage clean and dry for 24 hours   May use ice/Tylenol/Ibuprofen for soreness or pain   If you develop fever, drainage or increased warmth from incision site-contact office immediately   

## 2018-09-15 ENCOUNTER — Other Ambulatory Visit (HOSPITAL_COMMUNITY): Payer: Self-pay | Admitting: Obstetrics and Gynecology

## 2018-10-08 ENCOUNTER — Encounter: Payer: No Typology Code available for payment source | Admitting: Nurse Practitioner

## 2018-11-01 ENCOUNTER — Encounter: Payer: Self-pay | Admitting: Nurse Practitioner

## 2018-11-01 ENCOUNTER — Ambulatory Visit (INDEPENDENT_AMBULATORY_CARE_PROVIDER_SITE_OTHER): Payer: Medicaid Other | Admitting: Internal Medicine

## 2018-11-01 ENCOUNTER — Encounter: Payer: Self-pay | Admitting: Internal Medicine

## 2018-11-01 DIAGNOSIS — J029 Acute pharyngitis, unspecified: Secondary | ICD-10-CM | POA: Diagnosis not present

## 2018-11-01 MED ORDER — AMOXICILLIN-POT CLAVULANATE 875-125 MG PO TABS: 1.0000 | ORAL_TABLET | Freq: Two times a day (BID) | ORAL | 0 refills | Status: DC

## 2018-11-01 NOTE — Progress Notes (Signed)
Virtual Visit via Video Note  I connected with Mayer Camel on 11/01/18 at 11:00 AM EDT by a video enabled telemedicine application and verified that I am speaking with the correct person using two identifiers.   I discussed the limitations of evaluation and management by telemedicine and the availability of in person appointments. The patient expressed understanding and agreed to proceed.  History of Present Illness: The patient is a 38 y.o. female with visit for sore throat and fever. Denies cough and has mild neck swelling and tenderness. Does have mild eye watering but no nose drainage. Started about 1 week ago and is overall worsening. Has tried tylenol which did not help. Denies fatigue or body aches. Mild headache last week but this is not unusual and none since. Has tried tylenol which did not help. She is still breastfeeding.  Observations/Objective: Appearance: mildly ill, breathing appears normal, casual grooming, abdomen does not appear distended, throat with redness, cannot appreciate tonsils well on video display, mental status is A and O times 3  Assessment and Plan: See problem oriented charting  Follow Up Instructions: Rx for augmentin 5 day course and advised to start zyrtec  I discussed the assessment and treatment plan with the patient. The patient was provided an opportunity to ask questions and all were answered. The patient agreed with the plan and demonstrated an understanding of the instructions.   The patient was advised to call back or seek an in-person evaluation if the symptoms worsen or if the condition fails to improve as anticipated.  Myrlene Broker, MD

## 2018-11-01 NOTE — Assessment & Plan Note (Signed)
Rx for augmentin in case of strep throat. She does meet centor criteria for treatment. Discussed implications of breastfeeding.

## 2018-12-05 ENCOUNTER — Ambulatory Visit (INDEPENDENT_AMBULATORY_CARE_PROVIDER_SITE_OTHER): Payer: Medicaid Other | Admitting: Family

## 2018-12-05 ENCOUNTER — Encounter: Payer: Self-pay | Admitting: Family

## 2018-12-05 DIAGNOSIS — A084 Viral intestinal infection, unspecified: Secondary | ICD-10-CM | POA: Diagnosis not present

## 2018-12-05 MED ORDER — PROMETHAZINE HCL 12.5 MG PO TABS: 12.5000 mg | ORAL_TABLET | Freq: Three times a day (TID) | ORAL | 0 refills | Status: DC | PRN

## 2018-12-05 NOTE — Progress Notes (Signed)
Brittany [name] is a 38 y.o. female with the following history as recorded in [employer]:  Patient Active Problem List   Diagnosis Date Noted  . Sore throat 11/01/2018  . 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 Vitamin D deficiency 12/14/2017  . Murmur, cardiac 11/30/2016  . Anemia 11/30/2016  . Migraine 03/18/2016  . Subclinical hyperthyroidism 12/09/2015  . Chronic hypertension with superimposed preeclampsia 04/09/2011    Current Outpatient Medications  Medication Sig Dispense Refill  . amLODipine (NORVASC) 10 MG tablet TAKE 1 TABLET(10 MG) BY MOUTH DAILY 30 tablet 2  . Prenatal Vit-Fe Fumarate-FA (PRENATAL MULTIVITAMIN) TABS tablet Take 1 tablet by mouth daily at 12 noon.    . promethazine (PHENERGAN) 12.5 MG tablet Take 1 tablet (12.5 mg total) by mouth every 8 (eight) hours as needed for nausea or vomiting. 10 tablet 0   No current facility-administered medications for this visit.     Allergies: Patient has no known allergies.  Past Medical History:  Diagnosis Date  . Gallstone   . Headache(784.0)   . Heart murmur   . Hypertension   . Infection    UTI  . Maternal chronic hypertension in first trimester 04/09/2011  . Pregnancy induced hypertension    after 2nd delivery  . Vaginal Pap smear, abnormal    cant remember    Past Surgical History:  Procedure Laterality Date  . DILATION AND CURETTAGE OF UTERUS     dont remember year  . LEEP      Family History  Problem Relation Age of Onset  . Asthma Mother   . Hypertension Mother   . Asthma Maternal Grandmother   . Hypertension Maternal Grandmother     Social History   Tobacco Use  . Smoking status: Never Smoker  . Smokeless tobacco: Never Used  Substance Use Topics  . Alcohol use: No    Alcohol/week: 0.0 standard drinks    Subjective:    I connected with Mayer Camel on 12/05/18 at  9:00 AM EDT by a video enabled telemedicine application and verified that I am speaking with the correct person using  two identifiers.   I discussed the limitations of evaluation and management by telemedicine and the availability of in person appointments. The patient expressed understanding and agreed to proceed.  Patient presents with concerns for "stomach bug." Notes symptoms started suddenly last night with diarrhea, headache, nausea and vomiting; 2 of her children have been sick with similar symptoms- they are now doing better; patient does not have fever/ none of her children have had fever; + breast-feeding 83 month old; requesting work note for today and tomorrow; has been able to keep fluids down;   Objective:  There were no vitals filed for this visit.  General: Well developed, well nourished, in no acute distress  Head: Normocephalic and atraumatic  Lungs: Respirations unlabored;  Neurologic: Alert and oriented; speech intact; face symmetrical;   Assessment:  1. Viral gastroenteritis     Plan:  Low suspicion for COVID as patient's children had similar symptoms which lasted for 24 hours; discussed risks and benefits of using Phenergan- patient aware that studies have shown that low doses/ short-term use of Phenergan not thought to be problematic; BRAT diet discussed; work note for today and tomorrow; follow-up worse, no better.   No follow-ups on file.  No orders of the defined types were placed in this encounter.   Requested Prescriptions   Signed Prescriptions Disp Refills  . promethazine (PHENERGAN) 12.5 MG tablet 10  tablet 0    Sig: Take 1 tablet (12.5 mg total) by mouth every 8 (eight) hours as needed for nausea or vomiting.

## 2018-12-24 ENCOUNTER — Other Ambulatory Visit: Payer: Self-pay | Admitting: Obstetrics and Gynecology

## 2019-01-09 ENCOUNTER — Encounter: Payer: Self-pay | Admitting: Internal Medicine

## 2019-01-09 ENCOUNTER — Ambulatory Visit (INDEPENDENT_AMBULATORY_CARE_PROVIDER_SITE_OTHER): Payer: Medicaid Other | Admitting: Family

## 2019-01-09 ENCOUNTER — Encounter: Payer: Self-pay | Admitting: Family

## 2019-01-09 DIAGNOSIS — J029 Acute pharyngitis, unspecified: Secondary | ICD-10-CM

## 2019-01-09 MED ORDER — FLUTICASONE PROPIONATE 50 MCG/ACT NA SUSP: 2.0000 | Freq: Every day | NASAL | 6 refills | Status: DC

## 2019-01-09 NOTE — Progress Notes (Signed)
  Brittany [name] is a 38 y.o. female with the following history as recorded in [facility]:  Patient Active Problem List   Diagnosis Date Noted  . Sore throat 11/01/2018  . 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 Vitamin D deficiency 12/14/2017  . Murmur, cardiac 11/30/2016  . Anemia 11/30/2016  . Migraine 03/18/2016  . Subclinical hyperthyroidism 12/09/2015  . Chronic hypertension with superimposed preeclampsia 04/09/2011    Current Outpatient Medications  Medication Sig Dispense Refill  . amLODipine (NORVASC) 10 MG tablet TAKE 1 TABLET(10 MG) BY MOUTH DAILY 30 tablet 2  . Multiple Vitamin (MULTIVITAMIN WITH MINERALS) TABS tablet Take 1 tablet by mouth daily.    . fluticasone (FLONASE) 50 MCG/ACT nasal spray Place 2 sprays into both nostrils daily. 16 g 6   No current facility-administered medications for this visit.     Allergies: Patient has no known allergies.  Past Medical History:  Diagnosis Date  . Gallstone   . Headache(784.0)   . Heart murmur   . Hypertension   . Infection    UTI  . Maternal chronic hypertension in first trimester 04/09/2011  . Pregnancy induced hypertension    after 2nd delivery  . Vaginal Pap smear, abnormal    cant remember    Past Surgical History:  Procedure Laterality Date  . DILATION AND CURETTAGE OF UTERUS     dont remember year  . LEEP      Family History  Problem Relation Age of Onset  . Asthma Mother   . Hypertension Mother   . Asthma Maternal Grandmother   . Hypertension Maternal Grandmother     Social History   Tobacco Use  . Smoking status: Never Smoker  . Smokeless tobacco: Never Used  Substance Use Topics  . Alcohol use: No    Alcohol/week: 0.0 standard drinks    Subjective:    I connected with Brittany [name] on 01/09/19 at 11:20 AM EDT by a video enabled telemedicine application and verified that I am speaking with the correct person using two identifiers. Patient and I are the only 2 people on the video call.     I discussed the limitations of evaluation and management by telemedicine and the availability of in person appointments. The patient expressed understanding and agreed to proceed.  Patient is concerned that she woke up this morning with a sore throat; denies any fever or chills; notes that it is not painful to swallow- throat feels slightly "itchy" and "irritated." Has not taken any medications for symptom relief; none of her children are sick; is currently breast-feeding;    Objective:  There were no vitals filed for this visit.  General: Well developed, well nourished, in no acute distress  Head: Normocephalic and atraumatic  Lungs: Respirations unlabored;  Neurologic: Alert and oriented; speech intact; face symmetrical;   Assessment:  1. Sore throat     Plan:  Low suspicion for bacterial infection at this time- patient is not running any type of fever/ symptoms only started this morning; encouraged to try Claritin and Flonase for suspected post-nasal drainage; follow-up if symptoms persist.    No follow-ups on file.  No orders of the defined types were placed in this encounter.   Requested Prescriptions   Signed Prescriptions Disp Refills  . fluticasone (FLONASE) 50 MCG/ACT nasal spray 16 g 6    Sig: Place 2 sprays into both nostrils daily.

## 2019-01-17 ENCOUNTER — Other Ambulatory Visit: Payer: Self-pay

## 2019-01-21 ENCOUNTER — Other Ambulatory Visit: Payer: Self-pay

## 2019-01-21 ENCOUNTER — Encounter: Payer: Self-pay | Admitting: Family

## 2019-01-21 ENCOUNTER — Ambulatory Visit (INDEPENDENT_AMBULATORY_CARE_PROVIDER_SITE_OTHER): Payer: Self-pay | Admitting: Family

## 2019-01-21 ENCOUNTER — Other Ambulatory Visit (INDEPENDENT_AMBULATORY_CARE_PROVIDER_SITE_OTHER): Payer: Medicaid Other

## 2019-01-21 VITALS — BP 130/88 | HR 83 | Temp 98.8°F | Ht 65.0 in | Wt 166.6 lb

## 2019-01-21 DIAGNOSIS — I10 Essential (primary) hypertension: Secondary | ICD-10-CM

## 2019-01-21 DIAGNOSIS — Z13220 Encounter for screening for lipoid disorders: Secondary | ICD-10-CM | POA: Diagnosis not present

## 2019-01-21 DIAGNOSIS — Z0000 Encounter for general adult medical examination without abnormal findings: Secondary | ICD-10-CM

## 2019-01-21 LAB — CBC WITH DIFFERENTIAL/PLATELET
Basophils Absolute: 0.1 10*3/uL (ref 0.0–0.1)
Basophils Relative: 1 % (ref 0.0–3.0)
Eosinophils Absolute: 0.1 10*3/uL (ref 0.0–0.7)
Eosinophils Relative: 1.7 % (ref 0.0–5.0)
HCT: 38.5 % (ref 36.0–46.0)
Hemoglobin: 12.9 g/dL (ref 12.0–15.0)
Lymphocytes Relative: 33.3 % (ref 12.0–46.0)
Lymphs Abs: 2.1 10*3/uL (ref 0.7–4.0)
MCHC: 33.5 g/dL (ref 30.0–36.0)
MCV: 85.7 fl (ref 78.0–100.0)
Monocytes Absolute: 0.4 10*3/uL (ref 0.1–1.0)
Monocytes Relative: 6.8 % (ref 3.0–12.0)
Neutro Abs: 3.6 10*3/uL (ref 1.4–7.7)
Neutrophils Relative %: 57.2 % (ref 43.0–77.0)
Platelets: 292 10*3/uL (ref 150.0–400.0)
RBC: 4.49 Mil/uL (ref 3.87–5.11)
RDW: 13.7 % (ref 11.5–15.5)
WBC: 6.2 10*3/uL (ref 4.0–10.5)

## 2019-01-21 LAB — COMPREHENSIVE METABOLIC PANEL
ALT: 19 U/L (ref 0–35)
AST: 21 U/L (ref 0–37)
Albumin: 4.8 g/dL (ref 3.5–5.2)
Alkaline Phosphatase: 71 U/L (ref 39–117)
BUN: 12 mg/dL (ref 6–23)
CO2: 26 mEq/L (ref 19–32)
Calcium: 9.6 mg/dL (ref 8.4–10.5)
Chloride: 103 mEq/L (ref 96–112)
Creatinine, Ser: 0.76 mg/dL (ref 0.40–1.20)
GFR: 104.27 mL/min (ref >60.00)
Glucose, Bld: 81 mg/dL (ref 70–99)
Potassium: 3.8 mEq/L (ref 3.5–5.1)
Sodium: 138 mEq/L (ref 135–145)
Total Bilirubin: 0.4 mg/dL (ref 0.2–1.2)
Total Protein: 8.4 g/dL — ABNORMAL HIGH (ref 6.0–8.3)

## 2019-01-21 LAB — VITAMIN D 25 HYDROXY (VIT D DEFICIENCY, FRACTURES): VITD: 26.07 ng/mL — ABNORMAL LOW (ref 30.00–100.00)

## 2019-01-21 LAB — LIPID PANEL
Cholesterol: 140 mg/dL (ref 0–200)
HDL: 75.7 mg/dL (ref >39.00)
LDL Cholesterol: 57 mg/dL (ref 0–99)
NonHDL: 63.97
Total CHOL/HDL Ratio: 2
Triglycerides: 33 mg/dL (ref 0.0–149.0)
VLDL: 6.6 mg/dL (ref 0.0–40.0)

## 2019-01-21 LAB — TSH: TSH: 0.71 u[IU]/mL (ref 0.35–4.50)

## 2019-01-21 NOTE — Progress Notes (Signed)
Brittany [name] is a 38 y.o. female with the following history as recorded in [employer]:  Patient Active Problem List   Diagnosis Date Noted  . Sore throat 11/01/2018  . 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 Vitamin D deficiency 12/14/2017  . Murmur, cardiac 11/30/2016  . Anemia 11/30/2016  . Migraine 03/18/2016  . Subclinical hyperthyroidism 12/09/2015  . Chronic hypertension with superimposed preeclampsia 04/09/2011    Current Outpatient Medications  Medication Sig Dispense Refill  . amLODipine (NORVASC) 10 MG tablet TAKE 1 TABLET(10 MG) BY MOUTH DAILY 30 tablet 2  . etonogestrel (NEXPLANON) 68 MG IMPL implant 1 each by Subdermal route once.    . fluticasone (FLONASE) 50 MCG/ACT nasal spray Place 2 sprays into both nostrils daily. 16 g 6  . Multiple Vitamin (MULTIVITAMIN WITH MINERALS) TABS tablet Take 1 tablet by mouth daily.     No current facility-administered medications for this visit.     Allergies: Patient has no known allergies.  Past Medical History:  Diagnosis Date  . Gallstone   . Headache(784.0)   . Heart murmur   . Hypertension   . Infection    UTI  . Maternal chronic hypertension in first trimester 04/09/2011  . Pregnancy induced hypertension    after 2nd delivery  . Vaginal Pap smear, abnormal    cant remember    Past Surgical History:  Procedure Laterality Date  . DILATION AND CURETTAGE OF UTERUS     dont remember year  . LEEP      Family History  Problem Relation Age of Onset  . Asthma Mother   . Hypertension Mother   . CAD Mother   . Asthma Maternal Grandmother   . Hypertension Maternal Grandmother     Social History   Tobacco Use  . Smoking status: Never Smoker  . Smokeless tobacco: Never Used  Substance Use Topics  . Alcohol use: No    Alcohol/week: 0.0 standard drinks    Subjective:  Patient presents for yearly CPE: in baseline state of health today; Is currently breast-feeding; gave birth in November 2019- G4; did get  Nexplanon placed after pregnancy; developed high blood pressure/ was pre-eclamptic with pregnancy; Has seen eye doctor in the past year; overdue to see dentist; Sleeping is broken due children's needs; working 15-20 hours/ week at [employer];  LMP- Nexplanon  Review of Systems  Constitutional: Negative.   HENT: Negative.   Eyes: Negative.   Respiratory: Negative for shortness of breath.   Gastrointestinal: Negative for abdominal pain, constipation and diarrhea.  Genitourinary: Negative.   Musculoskeletal: Negative for myalgias.  Skin: Negative.   Neurological: Negative for dizziness.  Psychiatric/Behavioral: Negative for depression. The patient is not nervous/anxious and does not have insomnia.      Objective:  Vitals:   01/21/19 0920  BP: 130/88  Pulse: 83  Temp: 98.8 F (37.1 C)  TempSrc: Oral  SpO2: 90%  Weight: 166 lb 9.6 oz (75.6 kg)  Height: _0  (1.651 m)    General: Well developed, well nourished, in no acute distress  Skin : Warm and dry.  Head: Normocephalic and atraumatic  Eyes: Sclera and conjunctiva clear; pupils round and reactive to light; extraocular movements intact  Ears: External normal; canals clear; tympanic membranes normal  Oropharynx: Pink, supple. No suspicious lesions  Neck: Supple without thyromegaly, adenopathy  Lungs: Respirations unlabored; clear to auscultation bilaterally without wheeze, rales, rhonchi  CVS exam: normal rate and regular rhythm.  Abdomen: Soft; nontender; nondistended; normoactive bowel sounds; no  masses or hepatosplenomegaly  Musculoskeletal: No deformities; no active joint inflammation  Extremities: No edema, cyanosis, clubbing  Vessels: Symmetric bilaterally  Neurologic: Alert and oriented; speech intact; face symmetrical; moves all extremities well; CNII-XII intact without focal deficit   Assessment:  1. PE (physical exam), annual   2. Lipid screening   3. Essential hypertension     Plan:  Age appropriate  preventive healthcare needs addressed; encouraged regular eye doctor and dental exams; encouraged regular exercise; will update labs and refills as needed today; DASH diet discussed; follow-up in 3 months to re-check blood pressure.     Return in about 3 months (around 04/23/2019).  Orders Placed This Encounter  Procedures  . CBC w/Diff    Standing Status:   Future    Number of Occurrences:   1    Standing Expiration Date:   01/21/2020  . Comp Met (CMET)    Standing Status:   Future    Number of Occurrences:   1    Standing Expiration Date:   01/21/2020  . Lipid panel    Standing Status:   Future    Number of Occurrences:   1    Standing Expiration Date:   01/21/2020  . TSH    Standing Status:   Future    Number of Occurrences:   1    Standing Expiration Date:   01/21/2020  . Vitamin D (25 hydroxy)    Standing Status:   Future    Number of Occurrences:   1    Standing Expiration Date:   01/21/2020    Requested Prescriptions    No prescriptions requested or ordered in this encounter

## 2019-01-21 NOTE — Patient Instructions (Signed)

## 2019-01-29 ENCOUNTER — Telehealth: Payer: Self-pay

## 2019-01-29 ENCOUNTER — Encounter: Payer: Self-pay | Admitting: Family

## 2019-01-29 ENCOUNTER — Telehealth: Payer: Self-pay | Admitting: General Practice

## 2019-01-29 ENCOUNTER — Other Ambulatory Visit: Payer: Self-pay

## 2019-01-29 DIAGNOSIS — Z20822 Contact with and (suspected) exposure to covid-19: Secondary | ICD-10-CM

## 2019-01-29 NOTE — Telephone Encounter (Signed)
Patient has been scheduled per note in chart.

## 2019-01-29 NOTE — Telephone Encounter (Signed)
Brittany [name] DOB; Dec 21, 1980 MRN# [id number]  Reason: Potential exposure  Insurance: Medicaid  ID# [id number]  Contact # [phone]

## 2019-01-29 NOTE — Addendum Note (Signed)
Addended by: Denman George on: 01/29/2019 10:47 AM   Modules accepted: Orders

## 2019-01-29 NOTE — Telephone Encounter (Signed)
Pt has been scheduled for Covid testing.  Pt was referred by: Marrian Salvage, [place]

## 2019-01-29 NOTE — Telephone Encounter (Signed)
Copied from [place] [phone]. Topic: General - Other >> Jan 29, 2019 10:15 AM Antonieta Iba C wrote: Reason for CRM: Called to schedule pt for Covid testing. Pt says that she need a note from her provider stating that she has been tested to give to her employer. Pt says that letter can be sent to her via mychart if possible.

## 2019-01-30 ENCOUNTER — Encounter: Payer: Self-pay | Admitting: Family

## 2019-02-01 ENCOUNTER — Encounter: Payer: Self-pay | Admitting: Family

## 2019-02-03 LAB — NOVEL CORONAVIRUS, NAA: SARS-CoV-2, NAA: NOT DETECTED

## 2019-03-11 ENCOUNTER — Encounter: Payer: Self-pay | Admitting: Family

## 2019-03-12 ENCOUNTER — Encounter: Payer: Self-pay | Admitting: Family

## 2019-03-12 ENCOUNTER — Other Ambulatory Visit: Payer: Self-pay

## 2019-03-12 ENCOUNTER — Other Ambulatory Visit: Payer: Self-pay | Admitting: Family

## 2019-03-12 DIAGNOSIS — Z20822 Contact with and (suspected) exposure to covid-19: Secondary | ICD-10-CM

## 2019-03-12 DIAGNOSIS — Z20828 Contact with and (suspected) exposure to other viral communicable diseases: Secondary | ICD-10-CM

## 2019-03-13 ENCOUNTER — Encounter: Payer: Self-pay | Admitting: Family

## 2019-03-14 LAB — NOVEL CORONAVIRUS, NAA: SARS-CoV-2, NAA: NOT DETECTED

## 2019-03-15 ENCOUNTER — Other Ambulatory Visit: Payer: Self-pay | Admitting: Family

## 2019-04-01 ENCOUNTER — Encounter: Payer: Self-pay | Admitting: Family

## 2019-04-01 ENCOUNTER — Other Ambulatory Visit: Payer: Self-pay | Admitting: Family

## 2019-04-01 MED ORDER — AMLODIPINE BESYLATE 10 MG PO TABS: 10.0000 mg | ORAL_TABLET | Freq: Every day | ORAL | 0 refills | Status: DC

## 2019-04-22 ENCOUNTER — Encounter: Payer: Self-pay | Admitting: Family

## 2019-04-23 ENCOUNTER — Ambulatory Visit: Payer: Medicaid Other | Admitting: Family

## 2019-04-25 ENCOUNTER — Ambulatory Visit (HOSPITAL_COMMUNITY)
Admission: EM | Admit: 2019-04-25 | Discharge: 2019-04-25 | Disposition: A | Discharge status: Home / Self Care | Payer: Medicaid Other | Attending: Family Medicine | Admitting: Family Medicine

## 2019-04-25 ENCOUNTER — Other Ambulatory Visit: Payer: Self-pay

## 2019-04-25 ENCOUNTER — Encounter (HOSPITAL_COMMUNITY): Payer: Self-pay | Admitting: Emergency Medicine

## 2019-04-25 ENCOUNTER — Encounter: Payer: Self-pay | Admitting: Family

## 2019-04-25 DIAGNOSIS — Z20828 Contact with and (suspected) exposure to other viral communicable diseases: Secondary | ICD-10-CM | POA: Insufficient documentation

## 2019-04-25 DIAGNOSIS — J0110 Acute frontal sinusitis, unspecified: Secondary | ICD-10-CM | POA: Diagnosis not present

## 2019-04-25 DIAGNOSIS — I10 Essential (primary) hypertension: Secondary | ICD-10-CM

## 2019-04-25 MED ORDER — DEXAMETHASONE SODIUM PHOSPHATE 10 MG/ML IJ SOLN
10.0000 mg | Freq: Once | INTRAMUSCULAR | Status: AC
  Administered 2019-04-25: 13:00:00 10 mg via INTRAMUSCULAR

## 2019-04-25 MED ORDER — DEXAMETHASONE SODIUM PHOSPHATE 10 MG/ML IJ SOLN
INTRAMUSCULAR | Status: AC
  Filled 2019-04-25: qty 1

## 2019-04-25 NOTE — ED Triage Notes (Signed)
Sore throat started hurting this morning Bilateral ear pain started 2 days ago.   Denies fever Complains of chills.

## 2019-04-25 NOTE — Discharge Instructions (Addendum)
Keep using the zyrtec and Flonase daily.  Steroid injection given here for inflammation and pain.  You can continue the OTC meds as needed.  Ibuprofen may help.  We will call you if your COVID test is positive.  Follow up as needed for continued or worsening symptoms

## 2019-04-26 ENCOUNTER — Ambulatory Visit: Payer: Medicaid Other | Admitting: Family

## 2019-04-26 ENCOUNTER — Encounter: Payer: Self-pay | Admitting: Family

## 2019-04-26 LAB — NOVEL CORONAVIRUS, NAA (HOSP ORDER, SEND-OUT TO REF LAB; TAT 18-24 HRS): SARS-CoV-2, NAA: NOT DETECTED

## 2019-04-27 ENCOUNTER — Encounter (HOSPITAL_COMMUNITY): Payer: Self-pay | Admitting: Emergency Medicine

## 2019-04-27 ENCOUNTER — Ambulatory Visit (HOSPITAL_COMMUNITY)
Admission: EM | Admit: 2019-04-27 | Discharge: 2019-04-27 | Disposition: A | Discharge status: Home / Self Care | Payer: Medicaid Other | Attending: Emergency Medicine | Admitting: Emergency Medicine

## 2019-04-27 ENCOUNTER — Other Ambulatory Visit: Payer: Self-pay

## 2019-04-27 DIAGNOSIS — K5900 Constipation, unspecified: Secondary | ICD-10-CM

## 2019-04-27 MED ORDER — DOCUSATE SODIUM 100 MG PO CAPS: 100.0000 mg | ORAL_CAPSULE | Freq: Two times a day (BID) | ORAL | 0 refills | Status: DC

## 2019-04-27 NOTE — ED Triage Notes (Signed)
Pt c/o constipation for the last few days, upper abdominal cramping off and on, last BM was last night, states only a little comes out. Tried stool softener without relief. Tested negative for covid yesterday.

## 2019-04-27 NOTE — ED Provider Notes (Signed)
[place]    CSN: [id number] Arrival date & time: 04/25/19  1151      History   Chief Complaint Chief Complaint  Patient presents with  . Sore Throat    HPI Brittany [name] is a 38 y.o. female.    URI Presenting symptoms: congestion, ear pain and sore throat   Severity:  Mild Duration:  1 day Timing:  Constant Chronicity:  New Relieved by:  Nothing Worsened by:  Nothing Ineffective treatments:  None tried Associated symptoms: no arthralgias, no headaches, no myalgias, no neck pain, no sinus pain, no sneezing, no swollen glands and no wheezing   Associated symptoms comment:  Chills  Risk factors: sick contacts     Past Medical History:  Diagnosis Date  . Gallstone   . Headache(784.0)   . Heart murmur   . Hypertension   . Infection    UTI  . Maternal chronic hypertension in first trimester 04/09/2011  . Pregnancy induced hypertension    after 2nd delivery  . Vaginal Pap smear, abnormal    cant remember    Patient Active Problem List   Diagnosis Date Noted  . Sore throat 11/01/2018  . 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 Vitamin D deficiency 12/14/2017  . Murmur, cardiac 11/30/2016  . Anemia 11/30/2016  . Migraine 03/18/2016  . Subclinical hyperthyroidism 12/09/2015  . Chronic hypertension with superimposed preeclampsia 04/09/2011    Past Surgical History:  Procedure Laterality Date  . DILATION AND CURETTAGE OF UTERUS     dont remember year  . LEEP      OB History    Gravida  9   Para  4   Term  2   Preterm  2   AB  5   Living  4     SAB  4   TAB  1   Ectopic  0   Multiple  0   Live Births  4            Home Medications    Prior to Admission medications   Medication Sig Start Date End Date Taking? Authorizing Provider  amLODipine (NORVASC) 10 MG tablet Take 1 tablet (10 mg total) by mouth daily. 04/01/19  Yes Marrian Salvage, FNP  etonogestrel (NEXPLANON) 68 MG IMPL implant 1 each by  Subdermal route once.    [provider]  fluticasone (FLONASE) 50 MCG/ACT nasal spray Place 2 sprays into both nostrils daily. 01/09/19   Marrian Salvage, FNP  Multiple Vitamin (MULTIVITAMIN WITH MINERALS) TABS tablet Take 1 tablet by mouth daily.    [provider]    Family History Family History  Problem Relation Age of Onset  . Asthma Mother   . Hypertension Mother   . CAD Mother   . Asthma Maternal Grandmother   . Hypertension Maternal Grandmother     Social History Social History   Tobacco Use  . Smoking status: Never Smoker  . Smokeless tobacco: Never Used  Substance Use Topics  . Alcohol use: No    Alcohol/week: 0.0 standard drinks  . Drug use: No     Allergies   Patient has no known allergies.   Review of Systems Review of Systems  HENT: Positive for congestion, ear pain and sore throat. Negative for sinus pain and sneezing.   Respiratory: Negative for wheezing.   Musculoskeletal: Negative for arthralgias, myalgias and neck pain.  Neurological: Negative for headaches.     Physical Exam Triage Vital Signs ED Triage  Vitals  Enc Vitals Group     BP 04/25/19 1234 (!) 151/99     Pulse Rate 04/25/19 1234 100     Resp 04/25/19 1234 18     Temp 04/25/19 1234 98.4 F (36.9 C)     Temp Source 04/25/19 1234 Temporal     SpO2 04/25/19 1234 100 %     Weight --      Height --      Head Circumference --      Peak Flow --      Pain Score 04/25/19 1229 4     Pain Loc --      Pain Edu? --      Excl. in [place]? --    No data found.  Updated Vital Signs BP (!) 144/87 (BP Location: Left Arm) Comment (BP Location): repositioning-did not take blood pressure medicine today  Pulse 100   Temp 98.4 F (36.9 C) (Temporal)   Resp 18   SpO2 100%   Visual Acuity Right Eye Distance:   Left Eye Distance:   Bilateral Distance:    Right Eye Near:   Left Eye Near:    Bilateral Near:     Physical Exam Vitals signs and nursing note reviewed.   Constitutional:      General: She is not in acute distress.    Appearance: She is well-developed. She is not ill-appearing, toxic-appearing or diaphoretic.  HENT:     Head: Normocephalic and atraumatic.     Right Ear: Tympanic membrane and ear canal normal.     Left Ear: Tympanic membrane and ear canal normal.     Nose: Congestion and rhinorrhea present.     Mouth/Throat:     Pharynx: Posterior oropharyngeal erythema present.     Tonsils: 1+ on the right. 1+ on the left.  Eyes:     Conjunctiva/sclera: Conjunctivae normal.  Neck:     Musculoskeletal: Normal range of motion.  Cardiovascular:     Rate and Rhythm: Normal rate and regular rhythm.  Pulmonary:     Effort: Pulmonary effort is normal.     Breath sounds: Normal breath sounds.  Lymphadenopathy:     Cervical: No cervical adenopathy.  Skin:    General: Skin is warm.  Neurological:     Mental Status: She is alert.  Psychiatric:        Mood and Affect: Mood normal.      UC Treatments / Results  Labs (all labs ordered are listed, but only abnormal results are displayed) Labs Reviewed  NOVEL CORONAVIRUS, NAA (HOSP ORDER, SEND-OUT TO REF LAB; TAT 18-24 HRS)    EKG   Radiology No results found.  Procedures Procedures (including critical care time)  Medications Ordered in UC Medications  dexamethasone (DECADRON) injection 10 mg (10 mg Intramuscular Given 04/25/19 1318)  dexamethasone (DECADRON) 10 MG/ML injection (has no administration in time range)    Initial Impression / Assessment and Plan / UC Course  I have reviewed the triage vital signs and the nursing notes.  Pertinent labs & imaging results that were available during my care of the patient were reviewed by me and considered in my medical decision making (see chart for details).     Sinusitis- treating with decadron in clinic and zyrtec and Flonase outpatient.  COVID testing done with labs pending.  Final Clinical Impressions(s) / UC Diagnoses    Final diagnoses:  Acute non-recurrent frontal sinusitis     Discharge Instructions     Keep using the  zyrtec and Flonase daily.  Steroid injection given here for inflammation and pain.  You can continue the OTC meds as needed.  Ibuprofen may help.  We will call you if your COVID test is positive.  Follow up as needed for continued or worsening symptoms     ED Prescriptions    None     PDMP not reviewed this encounter.   Dahlia Byes A, NP 04/27/19 1018

## 2019-04-27 NOTE — Discharge Instructions (Signed)
The Dulcolax tablet that you took has not had time to work.

## 2019-04-27 NOTE — ED Provider Notes (Signed)
[facility]    CSN: [id number] Arrival date & time: 04/27/19  1305      History   Chief Complaint Chief Complaint  Patient presents with  . Constipation    HPI Brittany [name] is a 38 y.o. female.   Patient presents with constipation x2 days.  She states she had a bowel movement yesterday evening but it was small and hard.  She states her abdomen is cramping today and she feels constipated.  She attempted treatment with a Dulcolax tablet 2 hours PTA.  She denies fever, chills, vomiting, diarrhea, or other symptoms.    The history is provided by the patient.    Past Medical History:  Diagnosis Date  . Gallstone   . Headache(784.0)   . Heart murmur   . Hypertension   . Infection    UTI  . Maternal chronic hypertension in first trimester 04/09/2011  . Pregnancy induced hypertension    after 2nd delivery  . Vaginal Pap smear, abnormal    cant remember    Patient Active Problem List   Diagnosis Date Noted  . Sore throat 11/01/2018  . 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 Vitamin D deficiency 12/14/2017  . Murmur, cardiac 11/30/2016  . Anemia 11/30/2016  . Migraine 03/18/2016  . Subclinical hyperthyroidism 12/09/2015  . Chronic hypertension with superimposed preeclampsia 04/09/2011    Past Surgical History:  Procedure Laterality Date  . DILATION AND CURETTAGE OF UTERUS     dont remember year  . LEEP      OB History    Gravida  9   Para  4   Term  2   Preterm  2   AB  5   Living  4     SAB  4   TAB  1   Ectopic  0   Multiple  0   Live Births  4            Home Medications    Prior to Admission medications   Medication Sig Start Date End Date Taking? Authorizing Provider  amLODipine (NORVASC) 10 MG tablet Take 1 tablet (10 mg total) by mouth daily. 04/01/19   Olive Bass, FNP  docusate sodium (COLACE) 100 MG capsule Take 1 capsule (100 mg total) by mouth every 12 (twelve) hours. 04/27/19   Mickie Bail,  NP  etonogestrel (NEXPLANON) 68 MG IMPL implant 1 each by Subdermal route once.    [provider]  fluticasone (FLONASE) 50 MCG/ACT nasal spray Place 2 sprays into both nostrils daily. 01/09/19   Olive Bass, FNP  Multiple Vitamin (MULTIVITAMIN WITH MINERALS) TABS tablet Take 1 tablet by mouth daily.    [provider]    Family History Family History  Problem Relation Age of Onset  . Asthma Mother   . Hypertension Mother   . CAD Mother   . Asthma Maternal Grandmother   . Hypertension Maternal Grandmother     Social History Social History   Tobacco Use  . Smoking status: Never Smoker  . Smokeless tobacco: Never Used  Substance Use Topics  . Alcohol use: No    Alcohol/week: 0.0 standard drinks  . Drug use: No     Allergies   Patient has no known allergies.   Review of Systems Review of Systems  Constitutional: Negative for chills and fever.  HENT: Negative for ear pain and sore throat.   Eyes: Negative for pain and visual disturbance.  Respiratory: Negative for cough and  shortness of breath.   Cardiovascular: Negative for chest pain and palpitations.  Gastrointestinal: Positive for abdominal pain and constipation. Negative for diarrhea, nausea and vomiting.  Genitourinary: Negative for dysuria and hematuria.  Musculoskeletal: Negative for arthralgias and back pain.  Skin: Negative for color change and rash.  Neurological: Negative for seizures and syncope.  All other systems reviewed and are negative.    Physical Exam Triage Vital Signs ED Triage Vitals  Enc Vitals Group     BP 04/27/19 1321 125/72     Pulse Rate 04/27/19 1321 77     Resp 04/27/19 1321 16     Temp 04/27/19 1321 97.9 F (36.6 C)     Temp src --      SpO2 04/27/19 1321 98 %     Weight --      Height --      Head Circumference --      Peak Flow --      Pain Score 04/27/19 1323 0     Pain Loc --      Pain Edu? --      Excl. in [place]? --    No data found.   Updated Vital Signs BP 125/72   Pulse 77   Temp 97.9 F (36.6 C)   Resp 16   SpO2 98%   Visual Acuity Right Eye Distance:   Left Eye Distance:   Bilateral Distance:    Right Eye Near:   Left Eye Near:    Bilateral Near:     Physical Exam Vitals signs and nursing note reviewed.  Constitutional:      General: She is not in acute distress.    Appearance: She is well-developed.  HENT:     Head: Normocephalic and atraumatic.     Mouth/Throat:     Mouth: Mucous membranes are moist.     Pharynx: Oropharynx is clear.  Eyes:     Conjunctiva/sclera: Conjunctivae normal.  Neck:     Musculoskeletal: Neck supple.  Cardiovascular:     Rate and Rhythm: Normal rate and regular rhythm.     Heart sounds: No murmur.  Pulmonary:     Effort: Pulmonary effort is normal. No respiratory distress.     Breath sounds: Normal breath sounds.  Abdominal:     General: Bowel sounds are normal. There is no distension.     Palpations: Abdomen is soft.     Tenderness: There is no abdominal tenderness. There is no right CVA tenderness, left CVA tenderness, guarding or rebound.  Skin:    General: Skin is warm and dry.  Neurological:     General: No focal deficit present.     Mental Status: She is alert and oriented to person, place, and time.      UC Treatments / Results  Labs (all labs ordered are listed, but only abnormal results are displayed) Labs Reviewed - No data to display  EKG   Radiology No results found.  Procedures Procedures (including critical care time)  Medications Ordered in UC Medications - No data to display  Initial Impression / Assessment and Plan / UC Course  I have reviewed the triage vital signs and the nursing notes.  Pertinent labs & imaging results that were available during my care of the patient were reviewed by me and considered in my medical decision making (see chart for details).    Constipation.  Treating with Colace twice daily.  Discussed with  patient that the Dulcolax tablet has not had enough  time to work as she just took it 2 hours ago.  Instructed patient to follow-up with her PCP her symptoms persist or worsen.  Patient agrees to plan of care.     Final Clinical Impressions(s) / UC Diagnoses   Final diagnoses:  Constipation, unspecified constipation type     Discharge Instructions     The Dulcolax tablet that you took has not had time to work.        ED Prescriptions    Medication Sig Dispense Auth. Provider   docusate sodium (COLACE) 100 MG capsule Take 1 capsule (100 mg total) by mouth every 12 (twelve) hours. 60 capsule Mickie Bailate, Naylea Wigington H, NP     PDMP not reviewed this encounter.   Mickie Bailate, Jalin Erpelding H, NP 04/27/19 [phone]

## 2019-05-29 ENCOUNTER — Encounter: Payer: Self-pay | Admitting: Family

## 2019-06-07 ENCOUNTER — Other Ambulatory Visit: Payer: Self-pay

## 2019-06-07 ENCOUNTER — Encounter: Payer: Self-pay | Admitting: Family

## 2019-06-07 ENCOUNTER — Ambulatory Visit (INDEPENDENT_AMBULATORY_CARE_PROVIDER_SITE_OTHER): Payer: Medicaid Other | Admitting: Family

## 2019-06-07 VITALS — BP 138/84 | HR 80 | Temp 98.8°F | Resp 16 | Ht 65.0 in | Wt 172.0 lb

## 2019-06-07 DIAGNOSIS — I10 Essential (primary) hypertension: Secondary | ICD-10-CM

## 2019-06-07 DIAGNOSIS — E049 Nontoxic goiter, unspecified: Secondary | ICD-10-CM | POA: Diagnosis not present

## 2019-06-07 MED ORDER — AMLODIPINE BESYLATE 10 MG PO TABS: 10.0000 mg | ORAL_TABLET | Freq: Every day | ORAL | 2 refills | Status: DC

## 2019-06-07 NOTE — Progress Notes (Addendum)
Brittany [name] is a 38 y.o. female with the following history as recorded in [employer]:  Patient Active Problem List   Diagnosis Date Noted  . Sore throat 11/01/2018  . 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 Vitamin D deficiency 12/14/2017  . Murmur, cardiac 11/30/2016  . Anemia 11/30/2016  . Migraine 03/18/2016  . Subclinical hyperthyroidism 12/09/2015  . Chronic hypertension with superimposed preeclampsia 04/09/2011    Current Outpatient Medications  Medication Sig Dispense Refill  . amLODipine (NORVASC) 10 MG tablet Take 1 tablet (10 mg total) by mouth daily. 90 tablet 2  . docusate sodium (COLACE) 100 MG capsule Take 1 capsule (100 mg total) by mouth every 12 (twelve) hours. 60 capsule 0  . etonogestrel (NEXPLANON) 68 MG IMPL implant 1 each by Subdermal route once.    . fluticasone (FLONASE) 50 MCG/ACT nasal spray Place 2 sprays into both nostrils daily. 16 g 6  . Multiple Vitamin (MULTIVITAMIN WITH MINERALS) TABS tablet Take 1 tablet by mouth daily.     No current facility-administered medications for this visit.     Allergies: Patient has no known allergies.  Past Medical History:  Diagnosis Date  . Gallstone   . Headache(784.0)   . Heart murmur   . Hypertension   . Infection    UTI  . Maternal chronic hypertension in first trimester 04/09/2011  . Pregnancy induced hypertension    after 2nd delivery  . Vaginal Pap smear, abnormal    cant remember    Past Surgical History:  Procedure Laterality Date  . DILATION AND CURETTAGE OF UTERUS     dont remember year  . LEEP      Family History  Problem Relation Age of Onset  . Asthma Mother   . Hypertension Mother   . CAD Mother   . Asthma Maternal Grandmother   . Hypertension Maternal Grandmother     Social History   Tobacco Use  . Smoking status: Never Smoker  . Smokeless tobacco: Never Used  Substance Use Topics  . Alcohol use: No    Alcohol/week: 0.0 standard drinks    Subjective:   3 month  follow-up on hypertension; notes she has not taken her blood pressure medication today; does check her blood pressure regularly- averaging 128/ 78;  LMP- Nexplanon ( thinks was placed in 05/2018) 4 children- 6 yo boy, 64 yo girl, 34 yo boy, 36 yo girl Is worried about chronic sore throat; feels like her throat is swollen    Objective:  Vitals:   06/07/19 1010  BP: 138/84  Pulse: 80  Resp: 16  Temp: 98.8 F (37.1 C)  TempSrc: Oral  Weight: 172 lb (78 kg)  Height: 5\' 5"  (1.651 m)    General: Well developed, well nourished, in no acute distress  Skin : Warm and dry.  Head: Normocephalic and atraumatic  Eyes: Sclera and conjunctiva clear; pupils round and reactive to light; extraocular movements intact  Ears: External normal; canals clear; tympanic membranes normal  Oropharynx: Pink, supple. No suspicious lesions  Neck: Supple with mild thyromegaly, no adenopathy  Lungs: Respirations unlabored; clear to auscultation bilaterally without wheeze, rales, rhonchi  CVS exam: normal rate and regular rhythm.  Neurologic: Alert and oriented; speech intact; face symmetrical; moves all extremities well; CNII-XII intact without focal deficit   Assessment:  1. Essential hypertension   2. Enlarged thyroid     Plan:  1. Stable; patient not on medication today; home #s are averaging 126/78; continue same medication; refill updated. 2.  Update thyroid ultrasound. Follow-up to be determined;  Will plan for pap smear/ CPE next summer.   This visit occurred during the SARS-CoV-2 public health emergency.  Safety protocols were in place, including screening questions prior to the visit, additional usage of staff PPE, and extensive cleaning of exam room while observing appropriate contact time as indicated for disinfecting solutions.    Return in about 8 months (around 02/04/2020).  Orders Placed This Encounter  Procedures  . US THYROID    Standing Status:   Future    Standing Expiration Date:    08/06/2020    Order Specific Question:   Reason for Exam (SYMPTOM  OR DIAGNOSIS REQUIRED)    Answer:   enlarged thyroid gland    Order Specific Question:   Preferred imaging location?    Answer:   [id number]    Requested Prescriptions   Signed Prescriptions Disp Refills  . amLODipine (NORVASC) 10 MG tablet 90 tablet 2    Sig: Take 1 tablet (10 mg total) by mouth daily.

## 2019-06-10 ENCOUNTER — Other Ambulatory Visit: Payer: Self-pay | Admitting: Family

## 2019-06-10 DIAGNOSIS — E049 Nontoxic goiter, unspecified: Secondary | ICD-10-CM

## 2019-06-15 ENCOUNTER — Ambulatory Visit (HOSPITAL_COMMUNITY)
Admission: EM | Admit: 2019-06-15 | Discharge: 2019-06-15 | Disposition: A | Discharge status: Home / Self Care | Payer: Medicaid Other | Attending: Emergency Medicine | Admitting: Emergency Medicine

## 2019-06-15 ENCOUNTER — Encounter (HOSPITAL_COMMUNITY): Payer: Self-pay

## 2019-06-15 ENCOUNTER — Other Ambulatory Visit: Payer: Self-pay

## 2019-06-15 DIAGNOSIS — J029 Acute pharyngitis, unspecified: Secondary | ICD-10-CM | POA: Diagnosis not present

## 2019-06-15 DIAGNOSIS — J069 Acute upper respiratory infection, unspecified: Secondary | ICD-10-CM | POA: Diagnosis present

## 2019-06-15 DIAGNOSIS — R0981 Nasal congestion: Secondary | ICD-10-CM

## 2019-06-15 DIAGNOSIS — Z8249 Family history of ischemic heart disease and other diseases of the circulatory system: Secondary | ICD-10-CM | POA: Diagnosis not present

## 2019-06-15 DIAGNOSIS — Z825 Family history of asthma and other chronic lower respiratory diseases: Secondary | ICD-10-CM | POA: Insufficient documentation

## 2019-06-15 DIAGNOSIS — I10 Essential (primary) hypertension: Secondary | ICD-10-CM | POA: Insufficient documentation

## 2019-06-15 DIAGNOSIS — Z20828 Contact with and (suspected) exposure to other viral communicable diseases: Secondary | ICD-10-CM | POA: Diagnosis not present

## 2019-06-15 DIAGNOSIS — Z79899 Other long term (current) drug therapy: Secondary | ICD-10-CM | POA: Insufficient documentation

## 2019-06-15 LAB — POC SARS CORONAVIRUS 2 AG -  ED: SARS Coronavirus 2 Ag: NEGATIVE

## 2019-06-15 LAB — POC SARS CORONAVIRUS 2 AG: SARS Coronavirus 2 Ag: NEGATIVE

## 2019-06-15 NOTE — ED Provider Notes (Signed)
[place]    CSN: [id number] Arrival date & time: 06/15/19  1010      History   Chief Complaint Chief Complaint  Patient presents with   Sore Throat   Nasal Congestion    HPI Brittany [name] is a 38 y.o. female.   Patient here concerned with "allergy like symptoms" x 2 days . Admits nasal congestion, rhinorrhea, PND, scratchy throat, denies fever, chills, otalgia, nausea, vomiting, diarrhea, abdominal pain, cough, wheezing, SOB.  No sick contacts, no known exposures to [place].  Patient is taking zyrtec and flonase for environmental allergies without relief, and taking tylenol cold/flu without relief.  She has not taken any tylenol cold and flu today.       Past Medical History:  Diagnosis Date   Gallstone    Headache(784.0)    Heart murmur    Hypertension    Infection    UTI   Maternal chronic hypertension in first trimester 04/09/2011   Pregnancy induced hypertension    after 2nd delivery   Vaginal Pap smear, abnormal    cant remember    Patient Active Problem List   Diagnosis Date Noted   Sore throat 11/01/2018   Depression affecting pregnancy 03/06/2018   Vitamin D deficiency 12/14/2017   Murmur, cardiac 11/30/2016   Anemia 11/30/2016   Migraine 03/18/2016   Subclinical hyperthyroidism 12/09/2015   Chronic hypertension with superimposed preeclampsia 04/09/2011    Past Surgical History:  Procedure Laterality Date   DILATION AND CURETTAGE OF UTERUS     dont remember year   LEEP      OB History    Gravida  9   Para  4   Term  2   Preterm  2   AB  5   Living  4     SAB  4   TAB  1   Ectopic  0   Multiple  0   Live Births  4            Home Medications    Prior to Admission medications   Medication Sig Start Date End Date Taking? Authorizing Provider  amLODipine (NORVASC) 10 MG tablet Take 1 tablet (10 mg total) by mouth daily. 06/07/19   Marrian Salvage, FNP  docusate sodium (COLACE)  100 MG capsule Take 1 capsule (100 mg total) by mouth every 12 (twelve) hours. 04/27/19   Sharion Balloon, NP  etonogestrel (NEXPLANON) 68 MG IMPL implant 1 each by Subdermal route once.    [provider]  fluticasone (FLONASE) 50 MCG/ACT nasal spray Place 2 sprays into both nostrils daily. 01/09/19   Marrian Salvage, FNP  Multiple Vitamin (MULTIVITAMIN WITH MINERALS) TABS tablet Take 1 tablet by mouth daily.    [provider]    Family History Family History  Problem Relation Age of Onset   Asthma Mother    Hypertension Mother    CAD Mother    Asthma Maternal Grandmother    Hypertension Maternal Grandmother     Social History Social History   Tobacco Use   Smoking status: Never Smoker   Smokeless tobacco: Never Used  Substance Use Topics   Alcohol use: No    Alcohol/week: 0.0 standard drinks   Drug use: No     Allergies   Patient has no known allergies.   Review of Systems Review of Systems  Constitutional: Negative for activity change, appetite change, chills, fatigue and fever.  HENT: Positive for postnasal drip, rhinorrhea and  sore throat. Negative for congestion, ear discharge, ear pain, facial swelling, hearing loss, sinus pressure, sneezing, trouble swallowing and voice change.   Eyes: Negative for pain, discharge, redness and itching.  Respiratory: Negative for cough, shortness of breath and wheezing.   Cardiovascular: Negative for chest pain.  Gastrointestinal: Negative for abdominal pain, diarrhea, nausea and vomiting.  Musculoskeletal: Negative for arthralgias and myalgias.  Skin: Negative for pallor and rash.  Allergic/Immunologic: Positive for environmental allergies. Negative for immunocompromised state.  Neurological: Negative for dizziness, light-headedness and headaches.  Hematological: Negative for adenopathy.  Psychiatric/Behavioral: Negative for behavioral problems and sleep disturbance.  All other systems reviewed  and are negative.    Physical Exam Triage Vital Signs ED Triage Vitals  Enc Vitals Group     BP 06/15/19 1032 (!) 153/91     Pulse Rate 06/15/19 1032 99     Resp 06/15/19 1032 16     Temp 06/15/19 1032 99.1 F (37.3 C)     Temp Source 06/15/19 1032 Oral     SpO2 06/15/19 1032 98 %     Weight --      Height --      Head Circumference --      Peak Flow --      Pain Score 06/15/19 1033 0     Pain Loc --      Pain Edu? --      Excl. in [place]? --    No data found.  Updated Vital Signs BP (!) 153/91 (BP Location: Right Arm)    Pulse 99    Temp 99.1 F (37.3 C) (Oral)    Resp 16    SpO2 98%   Visual Acuity Right Eye Distance:   Left Eye Distance:   Bilateral Distance:    Right Eye Near:   Left Eye Near:    Bilateral Near:     Physical Exam Vitals signs and nursing note reviewed.  Constitutional:      General: She is not in acute distress.    Appearance: She is well-developed.  HENT:     Head: Normocephalic and atraumatic.     Right Ear: Tympanic membrane, ear canal and external ear normal. No drainage. Tympanic membrane is not injected, retracted or bulging.     Left Ear: Tympanic membrane, ear canal and external ear normal. No drainage. Tympanic membrane is not injected, retracted or bulging.     Nose: Congestion (mild) and rhinorrhea present. No nasal deformity. Rhinorrhea is clear.     Right Turbinates: Swollen and pale.     Left Turbinates: Swollen and pale.     Right Sinus: No maxillary sinus tenderness or frontal sinus tenderness.     Left Sinus: No maxillary sinus tenderness or frontal sinus tenderness.     Mouth/Throat:     Pharynx: Oropharynx is clear. Uvula midline. No pharyngeal swelling, oropharyngeal exudate or posterior oropharyngeal erythema.     Tonsils: No tonsillar exudate or tonsillar abscesses.  Eyes:     Conjunctiva/sclera: Conjunctivae normal.  Neck:     Musculoskeletal: Neck supple.  Cardiovascular:     Rate and Rhythm: Normal rate and regular  rhythm.     Heart sounds: No murmur.  Pulmonary:     Effort: Pulmonary effort is normal. No respiratory distress.     Breath sounds: Normal breath sounds.  Abdominal:     Palpations: Abdomen is soft.     Tenderness: There is no abdominal tenderness.  Skin:    General: Skin is warm  and dry.     Capillary Refill: Capillary refill takes less than 2 seconds.  Neurological:     General: No focal deficit present.     Mental Status: She is alert and oriented to person, place, and time.  Psychiatric:        Mood and Affect: Mood normal.        Behavior: Behavior normal.      UC Treatments / Results  Labs (all labs ordered are listed, but only abnormal results are displayed) Labs Reviewed  NOVEL CORONAVIRUS, NAA (HOSP ORDER, SEND-OUT TO REF LAB; TAT 18-24 HRS)  POC SARS CORONAVIRUS 2 AG -  ED  POC SARS CORONAVIRUS 2 AG    EKG   Radiology No results found.  Procedures Procedures (including critical care time)  Medications Ordered in UC Medications - No data to display  Initial Impression / Assessment and Plan / UC Course  I have reviewed the triage vital signs and the nursing notes.  Pertinent labs & imaging results that were available during my care of the patient were reviewed by me and considered in my medical decision making (see chart for details).     POC COVID negative, will send out. Recommend remain in self isolation until results of send out test are known. Advised patient to be careful of taking any medications with decongestant in it due to her blood pressure. Final Clinical Impressions(s) / UC Diagnoses   Final diagnoses:  Nasal congestion  Viral upper respiratory tract infection     Discharge Instructions     Continue allergy medication as discussed. Remain in self isolation pending results of COVID19 swab.     ED Prescriptions    None     PDMP not reviewed this encounter.   Evern Core, PA-C 06/15/19 1130

## 2019-06-15 NOTE — ED Triage Notes (Signed)
Pt present scratchy throat and nasal congestion. Symptoms started two days ago. Pt tried otc medication with no relief.

## 2019-06-15 NOTE — Discharge Instructions (Addendum)
Continue allergy medication as discussed. Remain in self isolation pending results of COVID19 swab.

## 2019-06-17 LAB — NOVEL CORONAVIRUS, NAA (HOSP ORDER, SEND-OUT TO REF LAB; TAT 18-24 HRS): SARS-CoV-2, NAA: NOT DETECTED

## 2019-06-21 ENCOUNTER — Encounter (HOSPITAL_COMMUNITY): Payer: Self-pay | Admitting: Emergency Medicine

## 2019-06-21 ENCOUNTER — Ambulatory Visit (HOSPITAL_COMMUNITY)
Admission: EM | Admit: 2019-06-21 | Discharge: 2019-06-21 | Disposition: A | Discharge status: Home / Self Care | Payer: Medicaid Other | Attending: Family Medicine | Admitting: Family Medicine

## 2019-06-21 ENCOUNTER — Other Ambulatory Visit: Payer: Self-pay

## 2019-06-21 DIAGNOSIS — E559 Vitamin D deficiency, unspecified: Secondary | ICD-10-CM | POA: Insufficient documentation

## 2019-06-21 DIAGNOSIS — I10 Essential (primary) hypertension: Secondary | ICD-10-CM | POA: Diagnosis not present

## 2019-06-21 DIAGNOSIS — J069 Acute upper respiratory infection, unspecified: Secondary | ICD-10-CM | POA: Insufficient documentation

## 2019-06-21 DIAGNOSIS — D649 Anemia, unspecified: Secondary | ICD-10-CM | POA: Insufficient documentation

## 2019-06-21 DIAGNOSIS — Z20828 Contact with and (suspected) exposure to other viral communicable diseases: Secondary | ICD-10-CM | POA: Insufficient documentation

## 2019-06-21 DIAGNOSIS — R011 Cardiac murmur, unspecified: Secondary | ICD-10-CM | POA: Insufficient documentation

## 2019-06-21 DIAGNOSIS — J029 Acute pharyngitis, unspecified: Secondary | ICD-10-CM | POA: Insufficient documentation

## 2019-06-21 DIAGNOSIS — Z79899 Other long term (current) drug therapy: Secondary | ICD-10-CM | POA: Insufficient documentation

## 2019-06-21 DIAGNOSIS — H9209 Otalgia, unspecified ear: Secondary | ICD-10-CM | POA: Insufficient documentation

## 2019-06-21 LAB — POCT RAPID STREP A: Streptococcus, Group A Screen (Direct): NEGATIVE

## 2019-06-21 NOTE — ED Triage Notes (Signed)
Pt. States she has developed a sore throat, and nasal congestion with ear pain from both ears, for 2 days now. Wants COVID testing.

## 2019-06-21 NOTE — Discharge Instructions (Signed)
Push fluids to ensure adequate hydration and keep secretions thin.  Tylenol and/or ibuprofen as needed for pain or fevers.  Over the counter treatments such as saline nasal spray, mucinex etc as needed for symptoms.  Throat lozenges, gargles, chloraseptic spray, warm teas, popsicles etc to help with throat pain.   Self isolate until covid results are back and negative.  Will notify you by phone of any positive findings. Your negative results will be sent through your MyChart.     If symptoms worsen or do not improve in the next week to return to be seen or to follow up with your PCP.

## 2019-06-21 NOTE — ED Provider Notes (Signed)
[facility]    CSN: [id number] Arrival date & time: 06/21/19  4765      History   Chief Complaint Chief Complaint  Patient presents with  . Sore Throat    HPI Brittany [name] is a 38 y.o. female.   Mayer Camel presents with complaints of sore throat, ear pain, headache, and body aches. Started two days ago. No fevers. No nausea vomiting or diarrhea. Some nasal drainage. Some cough, non productive. No shortness of breath . No chest pain . No known ill contacts. Has has been taking OTC cold medications which have not helepd. Similar symptoms on 11/28, these symptoms had gotten better. Two days ago the symptoms returned and worsened. No pain with swallowing. Works at [employer]. History  Of htn.     ROS per HPI, negative if not otherwise mentioned.      Past Medical History:  Diagnosis Date  . Gallstone   . Headache(784.0)   . Heart murmur   . Hypertension   . Infection    UTI  . Maternal chronic hypertension in first trimester 04/09/2011  . Pregnancy induced hypertension    after 2nd delivery  . Vaginal Pap smear, abnormal    cant remember    Patient Active Problem List   Diagnosis Date Noted  . Sore throat 11/01/2018  . 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 Vitamin D deficiency 12/14/2017  . Murmur, cardiac 11/30/2016  . Anemia 11/30/2016  . Migraine 03/18/2016  . Subclinical hyperthyroidism 12/09/2015  . Chronic hypertension with superimposed preeclampsia 04/09/2011    Past Surgical History:  Procedure Laterality Date  . DILATION AND CURETTAGE OF UTERUS     dont remember year  . LEEP      OB History    Gravida  9   Para  4   Term  2   Preterm  2   AB  5   Living  4     SAB  4   TAB  1   Ectopic  0   Multiple  0   Live Births  4            Home Medications    Prior to Admission medications   Medication Sig Start Date End Date Taking? Authorizing Provider  amLODipine (NORVASC) 10 MG tablet Take 1  tablet (10 mg total) by mouth daily. 06/07/19   Olive Bass, FNP  docusate sodium (COLACE) 100 MG capsule Take 1 capsule (100 mg total) by mouth every 12 (twelve) hours. 04/27/19   Mickie Bail, NP  etonogestrel (NEXPLANON) 68 MG IMPL implant 1 each by Subdermal route once.    [provider]  fluticasone (FLONASE) 50 MCG/ACT nasal spray Place 2 sprays into both nostrils daily. 01/09/19   Olive Bass, FNP  Multiple Vitamin (MULTIVITAMIN WITH MINERALS) TABS tablet Take 1 tablet by mouth daily.    [provider]    Family History Family History  Problem Relation Age of Onset  . Asthma Mother   . Hypertension Mother   . CAD Mother   . Asthma Maternal Grandmother   . Hypertension Maternal Grandmother     Social History Social History   Tobacco Use  . Smoking status: Never Smoker  . Smokeless tobacco: Never Used  Substance Use Topics  . Alcohol use: No    Alcohol/week: 0.0 standard drinks  . Drug use: No     Allergies   Patient has no known allergies.   Review  of Systems Review of Systems   Physical Exam Triage Vital Signs ED Triage Vitals  Enc Vitals Group     BP 06/21/19 0853 (!) 148/95     Pulse Rate 06/21/19 0853 96     Resp 06/21/19 0853 17     Temp 06/21/19 0853 99 F (37.2 C)     Temp Source 06/21/19 0853 Oral     SpO2 06/21/19 0853 99 %     Weight 06/21/19 0851 171 lb (77.6 kg)     Height --      Head Circumference --      Peak Flow --      Pain Score 06/21/19 0851 7     Pain Loc --      Pain Edu? --      Excl. in [place]? --    No data found.  Updated Vital Signs BP (!) 148/95 (BP Location: Left Arm)   Pulse 96   Temp 99 F (37.2 C) (Oral)   Resp 17   Wt 171 lb (77.6 kg)   SpO2 99%   BMI 28.46 kg/m    Physical Exam Constitutional:      General: She is not in acute distress.    Appearance: She is well-developed.  HENT:     Head: Normocephalic and atraumatic.     Right Ear: Tympanic membrane, ear canal  and external ear normal.     Left Ear: Tympanic membrane, ear canal and external ear normal.     Nose: Nose normal.     Mouth/Throat:     Pharynx: Uvula midline.     Tonsils: No tonsillar exudate. 1+ on the right. 1+ on the left.  Eyes:     Conjunctiva/sclera: Conjunctivae normal.     Pupils: Pupils are equal, round, and reactive to light.  Cardiovascular:     Rate and Rhythm: Normal rate.  Pulmonary:     Effort: Pulmonary effort is normal.  Skin:    General: Skin is warm and dry.  Neurological:     Mental Status: She is alert and oriented to person, place, and time.      UC Treatments / Results  Labs (all labs ordered are listed, but only abnormal results are displayed) Labs Reviewed  NOVEL CORONAVIRUS, NAA (HOSP ORDER, SEND-OUT TO REF LAB; TAT 18-24 HRS)  CULTURE, GROUP A STREP [facility])  POCT RAPID STREP A    EKG   Radiology No results found.  Procedures Procedures (including critical care time)  Medications Ordered in UC Medications - No data to display  Initial Impression / Assessment and Plan / UC Course  I have reviewed the triage vital signs and the nursing notes.  Pertinent labs & imaging results that were available during my care of the patient were reviewed by me and considered in my medical decision making (see chart for details).     Non toxic. Benign physical exam. Afebrile. History and physical consistent with viral illness.  Negative covid testing 11/28. Symptoms resolved and then returned and somewhat new. Negative rapid strep. Repeat covid testing sent out and pending. Supportive cares recommended. Return precautions provided. Patient verbalized understanding and agreeable to plan.     Final Clinical Impressions(s) / UC Diagnoses   Final diagnoses:  Viral upper respiratory tract infection     Discharge Instructions     Push fluids to ensure adequate hydration and keep secretions thin.  Tylenol and/or ibuprofen as needed for pain or fevers.   Over the counter treatments such  as saline nasal spray, mucinex etc as needed for symptoms.  Throat lozenges, gargles, chloraseptic spray, warm teas, popsicles etc to help with throat pain.   Self isolate until covid results are back and negative.  Will notify you by phone of any positive findings. Your negative results will be sent through your MyChart.     If symptoms worsen or do not improve in the next week to return to be seen or to follow up with your PCP.       ED Prescriptions    None     PDMP not reviewed this encounter.   Georgetta HaberBurky, Alexina Niccoli B, NP 06/21/19 [phone]

## 2019-06-23 LAB — CULTURE, GROUP A STREP (THRC)

## 2019-06-23 LAB — NOVEL CORONAVIRUS, NAA (HOSP ORDER, SEND-OUT TO REF LAB; TAT 18-24 HRS): SARS-CoV-2, NAA: NOT DETECTED

## 2019-06-26 ENCOUNTER — Other Ambulatory Visit: Payer: Self-pay | Admitting: Family

## 2019-06-27 ENCOUNTER — Ambulatory Visit
Admission: RE | Admit: 2019-06-27 | Discharge: 2019-06-27 | Disposition: A | Discharge status: Home / Self Care | Payer: Medicaid Other | Source: Ambulatory Visit | Attending: Family | Admitting: Family

## 2019-06-27 ENCOUNTER — Other Ambulatory Visit: Payer: Self-pay | Admitting: Family

## 2019-06-27 DIAGNOSIS — E049 Nontoxic goiter, unspecified: Secondary | ICD-10-CM

## 2019-06-27 DIAGNOSIS — E041 Nontoxic single thyroid nodule: Secondary | ICD-10-CM

## 2019-06-28 ENCOUNTER — Encounter: Payer: Self-pay | Admitting: Family

## 2019-07-01 ENCOUNTER — Encounter: Payer: Self-pay | Admitting: Family

## 2019-07-05 ENCOUNTER — Telehealth: Payer: Self-pay | Admitting: Family

## 2019-07-05 ENCOUNTER — Encounter: Payer: Self-pay | Admitting: Family

## 2019-07-05 MED ORDER — AMOXICILLIN 875 MG PO TABS: 875.0000 mg | ORAL_TABLET | Freq: Two times a day (BID) | ORAL | 0 refills | Status: DC

## 2019-07-05 NOTE — Telephone Encounter (Signed)
Will you please call her and see what she is needing? She is sending random e-mails that someone told her that her glands are really swollen. I already asked her to go to [facility] but I think I am misunderstanding her messages.

## 2019-07-05 NOTE — Telephone Encounter (Signed)
Spoke with patient and info given 

## 2019-07-05 NOTE — Telephone Encounter (Signed)
I am going to send in Amoxicillin for her; she can take this while breast-feeding.

## 2019-07-06 ENCOUNTER — Encounter (HOSPITAL_COMMUNITY): Payer: Self-pay | Admitting: *Deleted

## 2019-07-06 ENCOUNTER — Other Ambulatory Visit: Payer: Self-pay

## 2019-07-06 ENCOUNTER — Ambulatory Visit (HOSPITAL_COMMUNITY)
Admission: EM | Admit: 2019-07-06 | Discharge: 2019-07-06 | Disposition: A | Discharge status: Home / Self Care | Payer: Medicaid Other | Attending: Urgent Care | Admitting: Urgent Care

## 2019-07-06 DIAGNOSIS — J3489 Other specified disorders of nose and nasal sinuses: Secondary | ICD-10-CM

## 2019-07-06 DIAGNOSIS — R070 Pain in throat: Secondary | ICD-10-CM | POA: Diagnosis not present

## 2019-07-06 DIAGNOSIS — R519 Headache, unspecified: Secondary | ICD-10-CM | POA: Diagnosis not present

## 2019-07-06 DIAGNOSIS — Z20828 Contact with and (suspected) exposure to other viral communicable diseases: Secondary | ICD-10-CM

## 2019-07-06 DIAGNOSIS — R5381 Other malaise: Secondary | ICD-10-CM | POA: Diagnosis not present

## 2019-07-06 DIAGNOSIS — R591 Generalized enlarged lymph nodes: Secondary | ICD-10-CM

## 2019-07-06 DIAGNOSIS — Z789 Other specified health status: Secondary | ICD-10-CM

## 2019-07-06 LAB — POC SARS CORONAVIRUS 2 AG -  ED: SARS Coronavirus 2 Ag: NEGATIVE

## 2019-07-06 LAB — POC SARS CORONAVIRUS 2 AG: SARS Coronavirus 2 Ag: NEGATIVE

## 2019-07-06 MED ORDER — NAPROXEN 500 MG PO TABS: 500.0000 mg | ORAL_TABLET | Freq: Two times a day (BID) | ORAL | 0 refills | Status: DC

## 2019-07-06 NOTE — ED Triage Notes (Addendum)
C/O sore throat, HA, swollen glands, runny nose, bilat ear pain x 3 days without known fever.  Went to [facility] yesterday and was told negative strep, but was started on amoxicillin.  Pt has pending Covid test, but wishes to have POC covid test in [facility] today.

## 2019-07-06 NOTE — ED Provider Notes (Signed)
[place]   MRN: [id number] DOB: 10-22-1980  Subjective:   Brittany [name] is a 38 y.o. female presenting for recheck on COVID 19. Patient has a COVID 19 test pending. She is currently on amoxicillin as prescribed from [facility] despite negative strep testing, states that it was for a throat infection. She also had negative strep testing 06/21/2019. She is requesting rapid COVID test in clinic today. Patient is breastfeeding.   No current facility-administered medications for this encounter.  Current Outpatient Medications:  .  amLODipine (NORVASC) 10 MG tablet, Take 1 tablet (10 mg total) by mouth daily., Disp: 90 tablet, Rfl: 2 .  amoxicillin (AMOXIL) 875 MG tablet, Take 1 tablet (875 mg total) by mouth 2 (two) times daily., Disp: 20 tablet, Rfl: 0 .  etonogestrel (NEXPLANON) 68 MG IMPL implant, 1 each by Subdermal route once., Disp: , Rfl:  .  Multiple Vitamin (MULTIVITAMIN WITH MINERALS) TABS tablet, Take 1 tablet by mouth daily., Disp: , Rfl:     No Known Allergies   Past Medical History:  Diagnosis Date  . Gallstone   . Headache(784.0)   . Heart murmur   . Hypertension   . Infection    UTI  . Maternal chronic hypertension in first trimester 04/09/2011  . Pregnancy induced hypertension    after 2nd delivery  . Vaginal Pap smear, abnormal    cant remember     Past Surgical History:  Procedure Laterality Date  . DILATION AND CURETTAGE OF UTERUS     dont remember year  . LEEP      Family History  Problem Relation Age of Onset  . Asthma Mother   . Hypertension Mother   . CAD Mother   . Asthma Maternal Grandmother   . Hypertension Maternal Grandmother     Social History   Tobacco Use  . Smoking status: Never Smoker  . Smokeless tobacco: Never Used  Substance Use Topics  . Alcohol use: No    Alcohol/week: 0.0 standard drinks  . Drug use: No    Review of Systems  Constitutional: Positive for malaise/fatigue. Negative for fever.  HENT: Positive  for congestion (runny nose), ear pain and sore throat. Negative for sinus pain.   Eyes: Negative for discharge and redness.  Respiratory: Negative for cough, hemoptysis, shortness of breath and wheezing.   Cardiovascular: Negative for chest pain.  Gastrointestinal: Negative for abdominal pain, diarrhea, nausea and vomiting.  Genitourinary: Negative for dysuria, flank pain and hematuria.  Musculoskeletal: Negative for myalgias.  Skin: Negative for rash.  Neurological: Positive for headaches. Negative for dizziness and weakness.  Psychiatric/Behavioral: Negative for depression and substance abuse.     Objective:   Vitals: BP (!) 149/88   Pulse (!) 105   Temp 99.1 F (37.3 C) (Oral)   Resp 18   SpO2 100%   Breastfeeding Yes   Physical Exam Constitutional:      General: She is not in acute distress.    Appearance: Normal appearance. She is well-developed. She is not ill-appearing, toxic-appearing or diaphoretic.  HENT:     Head: Normocephalic and atraumatic.     Right Ear: Tympanic membrane and ear canal normal. No drainage or tenderness. No middle ear effusion. Tympanic membrane is not erythematous.     Left Ear: Tympanic membrane and ear canal normal. No drainage or tenderness.  No middle ear effusion. Tympanic membrane is not erythematous.     Nose: Nose normal. No congestion or rhinorrhea.     Mouth/Throat:  Mouth: Mucous membranes are moist. No oral lesions.     Pharynx: Oropharynx is clear. No pharyngeal swelling, oropharyngeal exudate, posterior oropharyngeal erythema or uvula swelling.     Tonsils: No tonsillar exudate or tonsillar abscesses.  Eyes:     Extraocular Movements: Extraocular movements intact.     Right eye: Normal extraocular motion.     Left eye: Normal extraocular motion.     Conjunctiva/sclera: Conjunctivae normal.     Pupils: Pupils are equal, round, and reactive to light.  Cardiovascular:     Rate and Rhythm: Normal rate and regular rhythm.      Pulses: Normal pulses.     Heart sounds: Normal heart sounds. No murmur. No friction rub. No gallop.   Pulmonary:     Effort: Pulmonary effort is normal. No respiratory distress.     Breath sounds: Normal breath sounds. No stridor. No wheezing, rhonchi or rales.  Musculoskeletal:     Cervical back: Normal range of motion and neck supple.  Lymphadenopathy:     Cervical: No cervical adenopathy.  Skin:    General: Skin is warm and dry.     Findings: No rash.  Neurological:     General: No focal deficit present.     Mental Status: She is alert and oriented to person, place, and time.  Psychiatric:        Mood and Affect: Mood normal.        Behavior: Behavior normal.        Thought Content: Thought content normal.     Results for orders placed or performed during the hospital encounter of 07/06/19 (from the past 24 hour(s))  POC SARS Coronavirus 2 Ag-ED - Nasal Swab (BD Veritor Kit)     Status: None   Collection Time: 07/06/19  4:01 PM  Result Value Ref Range   SARS Coronavirus 2 Ag NEGATIVE NEGATIVE  POC SARS Coronavirus 2 Ag     Status: None   Collection Time: 07/06/19  4:01 PM  Result Value Ref Range   SARS Coronavirus 2 Ag NEGATIVE NEGATIVE    Assessment and Plan :   1. Antibiotics received only prior to arrival   2. Throat pain   3. Generalized headaches   4. Malaise   5. Stuffy and runny nose   6. Lymphadenopathy     Exam is benign. Pulse was 90 on recheck by PA-Kaitlin Ardito. COVID 19 testing is still pending from the other clinic. Will have her maintain amoxicillin. Start naproxen for lymphadenopathy. RID is 3.3% per UpToDate and is acceptable for use in breastfeeding patient. Advised she monitor patient's behavior and half her dose/discontinue as needed. Counseled patient on potential for adverse effects with medications prescribed/recommended today, ER and return-to-clinic precautions discussed, patient verbalized understanding.    Brittany [name], [place] 07/06/19 [phone]

## 2019-07-08 ENCOUNTER — Ambulatory Visit
Admission: EM | Admit: 2019-07-08 | Discharge: 2019-07-08 | Disposition: A | Discharge status: Home / Self Care | Payer: Medicaid Other | Attending: Physician Assistant | Admitting: Physician Assistant

## 2019-07-08 DIAGNOSIS — J029 Acute pharyngitis, unspecified: Secondary | ICD-10-CM

## 2019-07-08 MED ORDER — LIDOCAINE VISCOUS HCL 2 % MT SOLN: OROMUCOSAL | 0 refills | Status: DC

## 2019-07-08 MED ORDER — AZELASTINE HCL 0.1 % NA SOLN: 2.0000 | Freq: Two times a day (BID) | NASAL | 0 refills | Status: DC

## 2019-07-08 NOTE — Discharge Instructions (Signed)
Symptoms are most likely due to viral illness/ drainage down your throat. Start lidocaine for sore throat, do not eat or drink for the next 40 mins after use as it can stunt your gag reflex. Flonase, atrovent for nasal congestion/drainage. You can use over the counter nasal saline rinse such as neti pot for nasal congestion. Monitor for any worsening of symptoms, swelling of the throat, trouble breathing, trouble swallowing, leaning forward to breath, drooling, go to the emergency department for further evaluation needed.

## 2019-07-08 NOTE — ED Provider Notes (Signed)
[name]    CSN: [id number] Arrival date & time: 07/08/19  0827      History   Chief Complaint Chief Complaint  Patient presents with  . Sore Throat    HPI Brittany [name] is a 38 y.o. female.   37 year old female comes in for continued sore throat.  This has been going on for 3 weeks, with bilateral swollen lymph nodes, sore throat, neck pain.  Patient was tested negative for Covid and strep.  She was started on amoxicillin by PCP, without any improvement.  She was also prescribed prednisone at one point, but due to breast-feeding, PCP told her not to take it.  She has had rhinorrhea without cough, nasal congestion.  Denies fever, chills, body aches.  Denies nausea, vomiting, diarrhea, abdominal pain.  Denies shortness of breath, loss of taste or smell.     Past Medical History:  Diagnosis Date  . Gallstone   . Headache(784.0)   . Heart murmur   . Hypertension   . Infection    UTI  . Maternal chronic hypertension in first trimester 04/09/2011  . Pregnancy induced hypertension    after 2nd delivery  . Vaginal Pap smear, abnormal    cant remember    Patient Active Problem List   Diagnosis Date Noted  . Sore throat 11/01/2018  . 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 Vitamin D deficiency 12/14/2017  . Murmur, cardiac 11/30/2016  . Anemia 11/30/2016  . Migraine 03/18/2016  . Subclinical hyperthyroidism 12/09/2015  . Chronic hypertension with superimposed preeclampsia 04/09/2011    Past Surgical History:  Procedure Laterality Date  . DILATION AND CURETTAGE OF UTERUS     dont remember year  . LEEP      OB History    Gravida  9   Para  4   Term  2   Preterm  2   AB  5   Living  4     SAB  4   TAB  1   Ectopic  0   Multiple  0   Live Births  4           Home Medications    Prior to Admission medications   Medication Sig Start Date End Date Taking? Authorizing Provider  amLODipine (NORVASC) 10 MG tablet Take 1  tablet (10 mg total) by mouth daily. 06/07/19   Olive Bass, FNP  amoxicillin (AMOXIL) 875 MG tablet Take 1 tablet (875 mg total) by mouth 2 (two) times daily. 07/05/19   Olive Bass, FNP  azelastine (ASTELIN) 0.1 % nasal spray Place 2 sprays into both nostrils 2 (two) times daily. 07/08/19   Belinda Fisher, PA-C  etonogestrel (NEXPLANON) 68 MG IMPL implant 1 each by Subdermal route once.    [provider]  lidocaine (XYLOCAINE) 2 % solution 5-15 mL gurgle as needed 07/08/19   Cathie Hoops, Biance Moncrief V, PA-C  Multiple Vitamin (MULTIVITAMIN WITH MINERALS) TABS tablet Take 1 tablet by mouth daily.    [provider]  naproxen (NAPROSYN) 500 MG tablet Take 1 tablet (500 mg total) by mouth 2 (two) times daily. 07/06/19   Wallis Bamberg, PA-C  fluticasone (FLONASE) 50 MCG/ACT nasal spray Place 2 sprays into both nostrils daily. 01/09/19 07/06/19  Olive Bass, FNP    Family History Family History  Problem Relation Age of Onset  . Asthma Mother   . Hypertension Mother   . CAD Mother   . Asthma Maternal Grandmother   .  Hypertension Maternal Grandmother     Social History Social History   Tobacco Use  . Smoking status: Never Smoker  . Smokeless tobacco: Never Used  Substance Use Topics  . Alcohol use: No    Alcohol/week: 0.0 standard drinks  . Drug use: No     Allergies   Patient has no known allergies.   Review of Systems Review of Systems  Reason unable to perform ROS: See HPI as above.   Physical Exam Triage Vital Signs ED Triage Vitals  Enc Vitals Group     BP 07/08/19 0843 136/86     Pulse Rate 07/08/19 0843 96     Resp 07/08/19 0843 18     Temp 07/08/19 0843 98.8 F (37.1 C)     Temp Source 07/08/19 0843 Oral     SpO2 07/08/19 0843 98 %     Weight --      Height --      Head Circumference --      Peak Flow --      Pain Score 07/08/19 0844 7     Pain Loc --      Pain Edu? --      Excl. in [place]? --    No data found.  Updated Vital  Signs BP 136/86 (BP Location: Left Arm)   Pulse 96   Temp 98.8 F (37.1 C) (Oral)   Resp 18   SpO2 98%   Breastfeeding Yes   Physical Exam Constitutional:      General: She is not in acute distress.    Appearance: Normal appearance. She is not ill-appearing, toxic-appearing or diaphoretic.  HENT:     Head: Normocephalic and atraumatic.     Right Ear: Ear canal and external ear normal. A middle ear effusion is present. Tympanic membrane is not erythematous or retracted.     Left Ear: Tympanic membrane, ear canal and external ear normal. Tympanic membrane is not erythematous or bulging.     Mouth/Throat:     Mouth: Mucous membranes are moist.     Pharynx: Oropharynx is clear. Uvula midline. No posterior oropharyngeal erythema or uvula swelling.     Tonsils: No tonsillar exudate.  Cardiovascular:     Rate and Rhythm: Normal rate and regular rhythm.     Heart sounds: Normal heart sounds. No murmur. No friction rub. No gallop.   Pulmonary:     Effort: Pulmonary effort is normal. No accessory muscle usage, prolonged expiration, respiratory distress or retractions.     Comments: Lungs clear to auscultation without adventitious lung sounds. Musculoskeletal:     Cervical back: Normal range of motion and neck supple.  Neurological:     General: No focal deficit present.     Mental Status: She is alert and oriented to person, place, and time.      UC Treatments / Results  Labs (all labs ordered are listed, but only abnormal results are displayed) Labs Reviewed - No data to display  EKG   Radiology No results found.  Procedures Procedures (including critical care time)  Medications Ordered in UC Medications - No data to display  Initial Impression / Assessment and Plan / UC Course  I have reviewed the triage vital signs and the nursing notes.  Pertinent labs & imaging results that were available during my care of the patient were reviewed by me and considered in my medical  decision making (see chart for details).    No alarming signs on exam.  Patient with  negative Covid, strep.  Patient to continue amoxicillin as directed by PCP.  Will provide symptomatic treatment with lidocaine, azelastine.  Return precautions given.  Patient expresses understanding and agrees to plan.  Final Clinical Impressions(s) / UC Diagnoses   Final diagnoses:  Sore throat   ED Prescriptions    Medication Sig Dispense Auth. Provider   azelastine (ASTELIN) 0.1 % nasal spray Place 2 sprays into both nostrils 2 (two) times daily. 30 mL Jett Kulzer Peggs V, PA-C   lidocaine (XYLOCAINE) 2 % solution 5-15 mL gurgle as needed 150 mL Ok Edwards, PA-C     PDMP not reviewed this encounter.   Ok Edwards, PA-C 07/08/19 1953

## 2019-07-08 NOTE — ED Triage Notes (Addendum)
Pt c/o sore throat with bilateral swollen lymph nodes to upper neck x3 days. States had a negative COVID and Strep test on Friday at [facility]. Was given prednisone and told by her PCP do not take d/t breastfeeding.

## 2019-07-23 ENCOUNTER — Other Ambulatory Visit: Payer: Medicaid Other

## 2019-07-31 ENCOUNTER — Other Ambulatory Visit (HOSPITAL_COMMUNITY)
Admission: RE | Admit: 2019-07-31 | Discharge: 2019-07-31 | Disposition: A | Discharge status: Home / Self Care | Payer: Medicaid Other | Source: Ambulatory Visit | Attending: Radiology | Admitting: Radiology

## 2019-07-31 ENCOUNTER — Ambulatory Visit
Admission: RE | Admit: 2019-07-31 | Discharge: 2019-07-31 | Disposition: A | Discharge status: Home / Self Care | Payer: Medicaid Other | Source: Ambulatory Visit | Attending: Family | Admitting: Family

## 2019-07-31 DIAGNOSIS — E041 Nontoxic single thyroid nodule: Secondary | ICD-10-CM

## 2019-08-01 ENCOUNTER — Encounter: Payer: Self-pay | Admitting: Family

## 2019-08-01 LAB — CYTOLOGY - NON PAP

## 2019-08-02 ENCOUNTER — Encounter: Payer: Self-pay | Admitting: Family

## 2019-08-09 ENCOUNTER — Other Ambulatory Visit: Payer: Self-pay

## 2019-08-09 ENCOUNTER — Encounter (HOSPITAL_COMMUNITY): Payer: Self-pay

## 2019-08-09 ENCOUNTER — Ambulatory Visit (HOSPITAL_COMMUNITY)
Admission: EM | Admit: 2019-08-09 | Discharge: 2019-08-09 | Disposition: A | Discharge status: Home / Self Care | Payer: Medicaid Other | Attending: Family Medicine | Admitting: Family Medicine

## 2019-08-09 DIAGNOSIS — Z825 Family history of asthma and other chronic lower respiratory diseases: Secondary | ICD-10-CM | POA: Insufficient documentation

## 2019-08-09 DIAGNOSIS — Z8249 Family history of ischemic heart disease and other diseases of the circulatory system: Secondary | ICD-10-CM | POA: Diagnosis not present

## 2019-08-09 DIAGNOSIS — Z20822 Contact with and (suspected) exposure to covid-19: Secondary | ICD-10-CM

## 2019-08-09 DIAGNOSIS — Z79899 Other long term (current) drug therapy: Secondary | ICD-10-CM | POA: Diagnosis not present

## 2019-08-09 DIAGNOSIS — I10 Essential (primary) hypertension: Secondary | ICD-10-CM

## 2019-08-09 DIAGNOSIS — Z793 Long term (current) use of hormonal contraceptives: Secondary | ICD-10-CM | POA: Insufficient documentation

## 2019-08-09 DIAGNOSIS — J069 Acute upper respiratory infection, unspecified: Secondary | ICD-10-CM | POA: Insufficient documentation

## 2019-08-09 LAB — POCT RAPID STREP A: Streptococcus, Group A Screen (Direct): NEGATIVE

## 2019-08-09 MED ORDER — BENZONATATE 200 MG PO CAPS: 200.0000 mg | ORAL_CAPSULE | Freq: Three times a day (TID) | ORAL | 0 refills | Status: AC | PRN

## 2019-08-09 MED ORDER — FLUTICASONE PROPIONATE 50 MCG/ACT NA SUSP: 1.0000 | Freq: Every day | NASAL | 0 refills | Status: DC

## 2019-08-09 MED ORDER — CETIRIZINE HCL 10 MG PO CAPS: 10.0000 mg | ORAL_CAPSULE | Freq: Every day | ORAL | 0 refills | Status: DC

## 2019-08-09 NOTE — ED Provider Notes (Signed)
[facility]    CSN: [id number] Arrival date & time: 08/09/19  1036      History   Chief Complaint Chief Complaint  Patient presents with  . Nasal Congestion  . Sore Throat  . Cough    HPI Brittany [name] is a 39 y.o. female history of hypertension, migraines, presenting today for evaluation of URI symptoms.  Patient states that over the past 3 days she has had cough, congestion, sore throat and headache.  She has been using over-the-counter sinus severe without relief.  She denies any fevers chills or body aches.  Denies any chest pain or shortness of breath.  Denies any GI symptoms.  HPI  Past Medical History:  Diagnosis Date  . Gallstone   . Headache(784.0)   . Heart murmur   . Hypertension   . Infection    UTI  . Maternal chronic hypertension in first trimester 04/09/2011  . Pregnancy induced hypertension    after 2nd delivery  . Vaginal Pap smear, abnormal    cant remember    Patient Active Problem List   Diagnosis Date Noted  . Sore throat 11/01/2018  . 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 Vitamin D deficiency 12/14/2017  . Murmur, cardiac 11/30/2016  . Anemia 11/30/2016  . Migraine 03/18/2016  . Subclinical hyperthyroidism 12/09/2015  . Chronic hypertension with superimposed preeclampsia 04/09/2011    Past Surgical History:  Procedure Laterality Date  . DILATION AND CURETTAGE OF UTERUS     dont remember year  . LEEP      OB History    Gravida  9   Para  4   Term  2   Preterm  2   AB  5   Living  4     SAB  4   TAB  1   Ectopic  0   Multiple  0   Live Births  4            Home Medications    Prior to Admission medications   Medication Sig Start Date End Date Taking? Authorizing Provider  amLODipine (NORVASC) 10 MG tablet Take 1 tablet (10 mg total) by mouth daily. 06/07/19   Olive Bass, FNP  azelastine (ASTELIN) 0.1 % nasal spray Place 2 sprays into both nostrils 2 (two) times daily.  07/08/19   Cathie Hoops, Amy V, PA-C  benzonatate (TESSALON) 200 MG capsule Take 1 capsule (200 mg total) by mouth 3 (three) times daily as needed for up to 7 days for cough. 08/09/19 08/16/19  Ioan Landini C, PA-C  Cetirizine HCl 10 MG CAPS Take 1 capsule (10 mg total) by mouth daily for 10 days. 08/09/19 08/19/19  Ebonye Reade C, PA-C  etonogestrel (NEXPLANON) 68 MG IMPL implant 1 each by Subdermal route once.    [provider]  fluticasone (FLONASE) 50 MCG/ACT nasal spray Place 1-2 sprays into both nostrils daily for 7 days. 08/09/19 08/16/19  Jevaeh Shams C, PA-C  Multiple Vitamin (MULTIVITAMIN WITH MINERALS) TABS tablet Take 1 tablet by mouth daily.    [provider]    Family History Family History  Problem Relation Age of Onset  . Asthma Mother   . Hypertension Mother   . CAD Mother   . Asthma Maternal Grandmother   . Hypertension Maternal Grandmother     Social History Social History   Tobacco Use  . Smoking status: Never Smoker  . Smokeless tobacco: Never Used  Substance Use Topics  . Alcohol use: No  Alcohol/week: 0.0 standard drinks  . Drug use: No     Allergies   Patient has no known allergies.   Review of Systems Review of Systems  Constitutional: Negative for activity change, appetite change, chills, fatigue and fever.  HENT: Positive for congestion, rhinorrhea and sore throat. Negative for ear pain, sinus pressure and trouble swallowing.   Eyes: Negative for discharge and redness.  Respiratory: Positive for cough. Negative for chest tightness and shortness of breath.   Cardiovascular: Negative for chest pain.  Gastrointestinal: Negative for abdominal pain, diarrhea, nausea and vomiting.  Musculoskeletal: Negative for myalgias.  Skin: Negative for rash.  Neurological: Positive for headaches. Negative for dizziness and light-headedness.     Physical Exam Triage Vital Signs ED Triage Vitals  Enc Vitals Group     BP 08/09/19 1121 (!)  146/91     Pulse Rate 08/09/19 1121 83     Resp 08/09/19 1121 18     Temp 08/09/19 1121 99.1 F (37.3 C)     Temp Source 08/09/19 1121 Oral     SpO2 08/09/19 1121 100 %     Weight --      Height --      Head Circumference --      Peak Flow --      Pain Score 08/09/19 1119 0     Pain Loc --      Pain Edu? --      Excl. in [place]? --    No data found.  Updated Vital Signs BP (!) 146/91 (BP Location: Right Arm)   Pulse 83   Temp 99.1 F (37.3 C) (Oral)   Resp 18   SpO2 100%   Visual Acuity Right Eye Distance:   Left Eye Distance:   Bilateral Distance:    Right Eye Near:   Left Eye Near:    Bilateral Near:     Physical Exam Vitals and nursing note reviewed.  Constitutional:      General: She is not in acute distress.    Appearance: She is well-developed.  HENT:     Head: Normocephalic and atraumatic.     Ears:     Comments: Bilateral ears without tenderness to palpation of external auricle, tragus and mastoid, EAC's without erythema or swelling, TM's with good bony landmarks and cone of light. Non erythematous.     Mouth/Throat:     Comments: Oral mucosa pink and moist, no tonsillar enlargement or exudate. Posterior pharynx patent and nonerythematous, no uvula deviation or swelling. Normal phonation. Eyes:     Conjunctiva/sclera: Conjunctivae normal.  Cardiovascular:     Rate and Rhythm: Normal rate and regular rhythm.     Heart sounds: No murmur.  Pulmonary:     Effort: Pulmonary effort is normal. No respiratory distress.     Breath sounds: Normal breath sounds.     Comments: Breathing comfortably at rest, CTABL, no wheezing, rales or other adventitious sounds auscultated Abdominal:     Palpations: Abdomen is soft.     Tenderness: There is no abdominal tenderness.  Musculoskeletal:     Cervical back: Neck supple.  Skin:    General: Skin is warm and dry.  Neurological:     Mental Status: She is alert.      UC Treatments / Results  Labs (all labs ordered  are listed, but only abnormal results are displayed) Labs Reviewed  NOVEL CORONAVIRUS, NAA (HOSP ORDER, SEND-OUT TO REF LAB; TAT 18-24 HRS)  CULTURE, GROUP A STREP [facility])  POCT RAPID STREP A    EKG   Radiology No results found.  Procedures Procedures (including critical care time)  Medications Ordered in UC Medications - No data to display  Initial Impression / Assessment and Plan / UC Course  I have reviewed the triage vital signs and the nursing notes.  Pertinent labs & imaging results that were available during my care of the patient were reviewed by me and considered in my medical decision making (see chart for details).     URI symptoms x3 days, vital signs stable, exam unremarkable, rapid strep negative, culture pending.  Covid PCR pending.  Recommending continued symptomatic and supportive care, quarantine until Covid results return.  Continue to rest and push fluids.Discussed strict return precautions. Patient verbalized understanding and is agreeable with plan.  Final Clinical Impressions(s) / UC Diagnoses   Final diagnoses:  Viral URI with cough     Discharge Instructions     Covid swab pending, monitor my chart for results, quarantine until results return Continue to rest and drink plenty of fluids Tessalon every 8 hours as needed for cough Flonase nasal spray 1 to 2 spray in each nostril daily to help with sinus congestion and pressure Cetirizine daily to help with congestion and postnasal drainage  Please follow-up if developing worsening or persistent symptoms, difficulty breathing, fevers, shortness of breath   ED Prescriptions    Medication Sig Dispense Auth. Provider   benzonatate (TESSALON) 200 MG capsule Take 1 capsule (200 mg total) by mouth 3 (three) times daily as needed for up to 7 days for cough. 28 capsule Twanisha Foulk C, PA-C   fluticasone (FLONASE) 50 MCG/ACT nasal spray Place 1-2 sprays into both nostrils daily for 7 days. 1 g Jencarlos Nicolson,  Kyanna Mahrt C, PA-C   Cetirizine HCl 10 MG CAPS Take 1 capsule (10 mg total) by mouth daily for 10 days. 10 capsule Monzerrath Mcburney, [place] C, PA-C     PDMP not reviewed this encounter.   Lew Dawes, [place] 08/09/19 1228

## 2019-08-09 NOTE — Discharge Instructions (Signed)
Covid swab pending, monitor my chart for results, quarantine until results return Continue to rest and drink plenty of fluids Tessalon every 8 hours as needed for cough Flonase nasal spray 1 to 2 spray in each nostril daily to help with sinus congestion and pressure Cetirizine daily to help with congestion and postnasal drainage  Please follow-up if developing worsening or persistent symptoms, difficulty breathing, fevers, shortness of breath

## 2019-08-09 NOTE — ED Triage Notes (Signed)
Pt presents to UC with cough, nasal congestion and sore throat x 3 days. Pt is taking OTC medicines without relief.

## 2019-08-11 LAB — CULTURE, GROUP A STREP (THRC)

## 2019-08-11 LAB — NOVEL CORONAVIRUS, NAA (HOSP ORDER, SEND-OUT TO REF LAB; TAT 18-24 HRS): SARS-CoV-2, NAA: NOT DETECTED

## 2019-08-12 NOTE — Telephone Encounter (Signed)
-----   Message from Laqueta Due, PA-C sent at 08/12/2019 12:15 PM EST ----- Regarding: Afirma results [name], those results will be sent to pathology after specimen is processed (lab is in [place]) and addendum usually issued. Pathology dept may be able to give you a better idea of when results will be [phone]). ----- Message ----- From: Olive Bass, FNP Sent: 08/12/2019   9:41 AM EST To: [name], PA-C  Good morning,  I am [name]'s PCP. I was writing to follow-up on the path report for the thyroid biopsy you did for her on 08/01/19.  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    I have not gotten further information. Where will that result go? Will your office contact her with follow-up?  Thank you, [name], FNP

## 2019-08-15 ENCOUNTER — Encounter (HOSPITAL_COMMUNITY): Payer: Self-pay

## 2019-08-16 ENCOUNTER — Ambulatory Visit: Payer: Medicaid Other | Attending: Internal Medicine

## 2019-08-16 DIAGNOSIS — Z20822 Contact with and (suspected) exposure to covid-19: Secondary | ICD-10-CM

## 2019-08-17 LAB — NOVEL CORONAVIRUS, NAA: SARS-CoV-2, NAA: NOT DETECTED

## 2019-08-19 ENCOUNTER — Encounter: Payer: Self-pay | Admitting: Family

## 2019-08-21 ENCOUNTER — Ambulatory Visit
Admission: EM | Admit: 2019-08-21 | Discharge: 2019-08-21 | Disposition: A | Discharge status: Home / Self Care | Payer: Medicaid Other | Attending: Emergency Medicine | Admitting: Emergency Medicine

## 2019-08-21 DIAGNOSIS — R450 Nervousness: Secondary | ICD-10-CM | POA: Diagnosis not present

## 2019-08-21 DIAGNOSIS — Z20822 Contact with and (suspected) exposure to covid-19: Secondary | ICD-10-CM | POA: Diagnosis not present

## 2019-08-21 DIAGNOSIS — J069 Acute upper respiratory infection, unspecified: Secondary | ICD-10-CM | POA: Diagnosis not present

## 2019-08-21 NOTE — ED Triage Notes (Signed)
Pt c/o cough, congestion, sore throat and fatigue x2 days. States drank a [employer]film[employer]video at [employer], 30 mins ago developed chest discomfort. States "feels like my heart is fast". Pt appears  anxious, no distress noted.

## 2019-08-21 NOTE — ED Provider Notes (Signed)
[name]    CSN: [id number] Arrival date & time: 08/21/19  1306      History   Chief Complaint Chief Complaint  Patient presents with  . Nasal Congestion    HPI Torian Thoennes is a 39 y.o. female with history of hypertension, heart murmur presenting for 2-day course of dry cough, nasal congestion, sore throat and fatigue.  Patient states that due to the symptoms she drank a kick start earlier this morning which caused her to feel jittery.  Patient states that she now feels like her heart is going fast and feels a little discomfort with this.  Denying active chest pain, difficulty breathing, nausea, vomiting, lightheadedness.  Regarding cold symptoms, patient has been taking OTC allergy, cough and cold medications, Flonase without significant relief.  Denies fever, known sick contacts.  Patient denies history of cardiopulmonary disease, thyroid issues.  No change to hairs, skin, nails, bowel or bladder habit.   Past Medical History:  Diagnosis Date  . Gallstone   . Headache(784.0)   . Heart murmur   . Hypertension   . Infection    UTI  . Maternal chronic hypertension in first trimester 04/09/2011  . Pregnancy induced hypertension    after 2nd delivery  . Vaginal Pap smear, abnormal    cant remember    Patient Active Problem List   Diagnosis Date Noted  . Sore throat 11/01/2018  . 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 Vitamin D deficiency 12/14/2017  . Murmur, cardiac 11/30/2016  . Anemia 11/30/2016  . Migraine 03/18/2016  . Subclinical hyperthyroidism 12/09/2015  . Chronic hypertension with superimposed preeclampsia 04/09/2011    Past Surgical History:  Procedure Laterality Date  . DILATION AND CURETTAGE OF UTERUS     dont remember year  . LEEP      OB History    Gravida  9   Para  4   Term  2   Preterm  2   AB  5   Living  4     SAB  4   TAB  1   Ectopic  0   Multiple  0   Live Births  4            Home  Medications    Prior to Admission medications   Medication Sig Start Date End Date Taking? Authorizing Provider  amLODipine (NORVASC) 10 MG tablet Take 1 tablet (10 mg total) by mouth daily. 06/07/19   Marrian Salvage, FNP  azelastine (ASTELIN) 0.1 % nasal spray Place 2 sprays into both nostrils 2 (two) times daily. 07/08/19   Tasia Catchings, Amy V, PA-C  Cetirizine HCl 10 MG CAPS Take 1 capsule (10 mg total) by mouth daily for 10 days. 08/09/19 08/19/19  Wieters, Hallie C, PA-C  etonogestrel (NEXPLANON) 68 MG IMPL implant 1 each by Subdermal route once.    [provider]  fluticasone (FLONASE) 50 MCG/ACT nasal spray Place 1-2 sprays into both nostrils daily for 7 days. 08/09/19 08/16/19  Wieters, Hallie C, PA-C  Multiple Vitamin (MULTIVITAMIN WITH MINERALS) TABS tablet Take 1 tablet by mouth daily.    [provider]    Family History Family History  Problem Relation Age of Onset  . Asthma Mother   . Hypertension Mother   . CAD Mother   . Asthma Maternal Grandmother   . Hypertension Maternal Grandmother     Social History Social History   Tobacco Use  . Smoking status: Never Smoker  . Smokeless tobacco:  Never Used  Substance Use Topics  . Alcohol use: No    Alcohol/week: 0.0 standard drinks  . Drug use: No     Allergies   Patient has no known allergies.   Review of Systems As per HPI   Physical Exam Triage Vital Signs ED Triage Vitals [08/21/19 1328]  Enc Vitals Group     BP 125/83     Pulse Rate 83     Resp 18     Temp 98 F (36.7 C)     Temp Source Oral     SpO2      Weight      Height      Head Circumference      Peak Flow      Pain Score 0     Pain Loc      Pain Edu?      Excl. in [place]?    No data found.  Updated Vital Signs BP 125/83 (BP Location: Left Arm)   Pulse 83   Temp 98 F (36.7 C) (Oral)   Resp 18   Visual Acuity Right Eye Distance:   Left Eye Distance:   Bilateral Distance:    Right Eye Near:   Left Eye Near:     Bilateral Near:     Physical Exam Constitutional:      General: She is not in acute distress.    Appearance: She is obese. She is not ill-appearing or diaphoretic.  HENT:     Head: Normocephalic and atraumatic.     Mouth/Throat:     Mouth: Mucous membranes are moist.     Pharynx: Oropharynx is clear. No oropharyngeal exudate or posterior oropharyngeal erythema.  Eyes:     General: No scleral icterus.    Conjunctiva/sclera: Conjunctivae normal.     Pupils: Pupils are equal, round, and reactive to light.  Neck:     Comments: Trachea midline, negative JVD.  No thyromegaly or nodules appreciated Cardiovascular:     Rate and Rhythm: Normal rate and regular rhythm.     Heart sounds: Murmur present. No gallop.      Comments: Grade 2 systolic murmur noted Pulmonary:     Effort: Pulmonary effort is normal. No respiratory distress.     Breath sounds: No wheezing, rhonchi or rales.  Musculoskeletal:     Cervical back: Neck supple. No tenderness.  Lymphadenopathy:     Cervical: No cervical adenopathy.  Skin:    General: Skin is warm.     Capillary Refill: Capillary refill takes less than 2 seconds.     Coloration: Skin is not jaundiced or pale.     Findings: No bruising, erythema or rash.  Neurological:     General: No focal deficit present.     Mental Status: She is alert and oriented to person, place, and time.  Psychiatric:        Behavior: Behavior normal.        Thought Content: Thought content normal.     Comments: Patient has good eye contact throughout visit.  Negative for pressured speech      UC Treatments / Results  Labs (all labs ordered are listed, but only abnormal results are displayed) Labs Reviewed  NOVEL CORONAVIRUS, NAA   Narrative:    Performed at:  [address] [facility] [address], [place], [place]  [id number] Lab Director: Maurine Simmering MDPhD, Phone:  [phone]    EKG   Radiology No results found.  Procedures Procedures (including critical care  time)  Medications Ordered in UC Medications - No data to display  Initial Impression / Assessment and Plan / UC Course  I have reviewed the triage vital signs and the nursing notes.  Pertinent labs & imaging results that were available during my care of the patient were reviewed by me and considered in my medical decision making (see chart for details).     Patient afebrile, nontoxic in office today.  EKG done in office, reviewed by me and compared to previous from 01/02/2018: NSR with ventricular rate 77 bpm.  No QTC prolongation, ST elevation or depression.  Waveforms unchanged in all leads-stable EKG.  Reviewed findings with patient who verbalized understanding.  Upon further chart review, patient does have history of subclinical hypothyroidism.  Last TSH drawn July 2020: Within normal limits.  No other systemic symptoms, patient is not tachycardic: Low concern for thyroid storm-TSH deferred, patient agreeable.  Patient appears to be reassured at time of discharge: No active chest pain in office.  Patient to go home, relax, meditate, and will follow up with PCP via phone tomorrow.  Return precautions discussed, patient verbalized understanding and is agreeable to plan. Final Clinical Impressions(s) / UC Diagnoses   Final diagnoses:  URI, acute  Jittery feeling     Discharge Instructions     Your COVID test is pending - it is important to quarantine / isolate at home until your results are back. If you test positive and would like further evaluation for persistent or worsening symptoms, you may schedule an E-visit or virtual (video) visit throughout the [facility] app or website.  PLEASE NOTE: If you develop severe chest pain or shortness of breath please go to the ER or call 9-1-1 for further evaluation --> DO NOT schedule electronic or virtual visits for this. Please call our office for further guidance / recommendations as needed.  For information about the Covid vaccine,  please visit [email or web address]    ED Prescriptions    None     PDMP not reviewed this encounter.   Hall-Potvin, [place], [place] 08/22/19 1619

## 2019-08-21 NOTE — Discharge Instructions (Signed)
Your COVID test is pending - it is important to quarantine / isolate at home until your results are back. °If you test positive and would like further evaluation for persistent or worsening symptoms, you may schedule an E-visit or virtual (video) visit throughout the [facility] MyChart app or website. ° °PLEASE NOTE: If you develop severe chest pain or shortness of breath please go to the ER or call 9-1-1 for further evaluation --> DO NOT schedule electronic or virtual visits for this. °Please call our office for further guidance / recommendations as needed. ° °For information about the Covid vaccine, please visit [email or web address] °

## 2019-08-22 LAB — NOVEL CORONAVIRUS, NAA: SARS-CoV-2, NAA: NOT DETECTED

## 2019-08-28 ENCOUNTER — Encounter: Payer: Self-pay | Admitting: Family

## 2019-08-28 ENCOUNTER — Other Ambulatory Visit: Payer: Self-pay | Admitting: Family

## 2019-08-30 ENCOUNTER — Other Ambulatory Visit: Payer: Self-pay | Admitting: Family

## 2019-08-30 DIAGNOSIS — N644 Mastodynia: Secondary | ICD-10-CM

## 2019-09-02 ENCOUNTER — Other Ambulatory Visit: Payer: Self-pay | Admitting: Family

## 2019-09-02 DIAGNOSIS — N644 Mastodynia: Secondary | ICD-10-CM

## 2019-09-09 ENCOUNTER — Encounter: Payer: Self-pay | Admitting: Emergency Medicine

## 2019-09-09 ENCOUNTER — Ambulatory Visit
Admission: EM | Admit: 2019-09-09 | Discharge: 2019-09-09 | Disposition: A | Discharge status: Home / Self Care | Payer: Medicaid Other | Attending: Emergency Medicine | Admitting: Emergency Medicine

## 2019-09-09 ENCOUNTER — Other Ambulatory Visit: Payer: Self-pay

## 2019-09-09 DIAGNOSIS — I10 Essential (primary) hypertension: Secondary | ICD-10-CM

## 2019-09-09 DIAGNOSIS — M545 Low back pain, unspecified: Secondary | ICD-10-CM

## 2019-09-09 LAB — POCT URINALYSIS DIP (MANUAL ENTRY)
Bilirubin, UA: NEGATIVE
Glucose, UA: NEGATIVE mg/dL
Ketones, POC UA: NEGATIVE mg/dL
Leukocytes, UA: NEGATIVE
Nitrite, UA: NEGATIVE
Protein Ur, POC: NEGATIVE mg/dL
Spec Grav, UA: 1.025 (ref 1.010–1.025)
Urobilinogen, UA: 0.2 E.U./dL
pH, UA: 6 (ref 5.0–8.0)

## 2019-09-09 MED ORDER — IBUPROFEN 800 MG PO TABS: 800.0000 mg | ORAL_TABLET | Freq: Three times a day (TID) | ORAL | 0 refills | Status: DC

## 2019-09-09 NOTE — ED Triage Notes (Signed)
Pt presents to [facility] for assessment of lower back pain x 2-3 days.  Concerned for UTI.  Pt states she is going more often.

## 2019-09-09 NOTE — Discharge Instructions (Addendum)
Recommend RICE: rest, ice, compression, elevation as needed for pain.    Heat therapy (hot compress, warm wash red, hot showers, etc.) can help relax muscles and soothe muscle aches. Cold therapy (ice packs) can be used to help swelling both after injury and after prolonged use of areas of chronic pain/aches.  For pain: ibuprofen as directed.  Return for worsening pain, blood in urine, fever, numbness in groin/legs, difficulty holding bowel/bladder.

## 2019-09-09 NOTE — ED Provider Notes (Signed)
[name]    CSN: [id number] Arrival date & time: 09/09/19  1210      History   Chief Complaint Chief Complaint  Patient presents with  . Back Pain    HPI Brittany [name] is a 39 y.o. female with history of hypertension, currently breast-feeding, presenting for 2 to 3-day course of low back pain (L>R).  States back pain is nonradiating, worse with certain movements.  Denies inciting event or injury.  Has been taking 1 g Tylenol without relief.  Patient does note that she is on her feet at work throughout the day: Denies bending, lifting.  Patient states she has been urinating more, though been focusing on drinking more water.  Denies dysuria, hematuria, abdominal or pelvic pain, vaginal discharge.  Patient denies history of UTI, kidney stones, kidney infections.  Patient currently has Nexplanon in place x1 year.   Past Medical History:  Diagnosis Date  . Gallstone   . Headache(784.0)   . Heart murmur   . Hypertension   . Infection    UTI  . Maternal chronic hypertension in first trimester 04/09/2011  . Pregnancy induced hypertension    after 2nd delivery  . Vaginal Pap smear, abnormal    cant remember    Patient Active Problem List   Diagnosis Date Noted  . Sore throat 11/01/2018  . 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 Vitamin D deficiency 12/14/2017  . Murmur, cardiac 11/30/2016  . Anemia 11/30/2016  . Migraine 03/18/2016  . Subclinical hyperthyroidism 12/09/2015  . Chronic hypertension with superimposed preeclampsia 04/09/2011    Past Surgical History:  Procedure Laterality Date  . DILATION AND CURETTAGE OF UTERUS     dont remember year  . LEEP      OB History    Gravida  9   Para  4   Term  2   Preterm  2   AB  5   Living  4     SAB  4   TAB  1   Ectopic  0   Multiple  0   Live Births  4            Home Medications    Prior to Admission medications   Medication Sig Start Date End Date Taking? Authorizing  Provider  amLODipine (NORVASC) 10 MG tablet Take 1 tablet (10 mg total) by mouth daily. 06/07/19   Olive Bass, FNP  azelastine (ASTELIN) 0.1 % nasal spray Place 2 sprays into both nostrils 2 (two) times daily. 07/08/19   Cathie Hoops, Amy V, PA-C  Cetirizine HCl 10 MG CAPS Take 1 capsule (10 mg total) by mouth daily for 10 days. 08/09/19 08/19/19  Wieters, Hallie C, PA-C  etonogestrel (NEXPLANON) 68 MG IMPL implant 1 each by Subdermal route once.    [provider]  fluticasone (FLONASE) 50 MCG/ACT nasal spray Place 1-2 sprays into both nostrils daily for 7 days. 08/09/19 08/16/19  Wieters, Hallie C, PA-C  ibuprofen (ADVIL) 800 MG tablet Take 1 tablet (800 mg total) by mouth 3 (three) times daily. 09/09/19   Hall-Potvin, [place], PA-C  Multiple Vitamin (MULTIVITAMIN WITH MINERALS) TABS tablet Take 1 tablet by mouth daily.    [provider]    Family History Family History  Problem Relation Age of Onset  . Asthma Mother   . Hypertension Mother   . CAD Mother   . Asthma Maternal Grandmother   . Hypertension Maternal Grandmother     Social History Social History  Tobacco Use  . Smoking status: Never Smoker  . Smokeless tobacco: Never Used  Substance Use Topics  . Alcohol use: No    Alcohol/week: 0.0 standard drinks  . Drug use: No     Allergies   Patient has no known allergies.   Review of Systems As per HPI   Physical Exam Triage Vital Signs ED Triage Vitals  Enc Vitals Group     BP      Pulse      Resp      Temp      Temp src      SpO2      Weight      Height      Head Circumference      Peak Flow      Pain Score      Pain Loc      Pain Edu?      Excl. in [place]?    No data found.  Updated Vital Signs BP (!) 149/95 (BP Location: Left Arm)   Pulse 99   Temp 97.7 F (36.5 C) (Temporal)   Resp 18   SpO2 96%   Visual Acuity Right Eye Distance:   Left Eye Distance:   Bilateral Distance:    Right Eye Near:   Left Eye Near:     Bilateral Near:     Physical Exam Constitutional:      General: She is not in acute distress. HENT:     Head: Normocephalic and atraumatic.  Eyes:     General: No scleral icterus.    Pupils: Pupils are equal, round, and reactive to light.  Cardiovascular:     Rate and Rhythm: Normal rate.  Pulmonary:     Effort: Pulmonary effort is normal. No respiratory distress.     Breath sounds: No wheezing.  Abdominal:     Tenderness: There is no right CVA tenderness or left CVA tenderness.  Musculoskeletal:        General: Tenderness present. No swelling, deformity or signs of injury. Normal range of motion.       Back:     Right lower leg: No edema.     Left lower leg: No edema.  Skin:    Coloration: Skin is not jaundiced or pale.  Neurological:     Mental Status: She is alert and oriented to person, place, and time.     Motor: No weakness.     Gait: Gait normal.     Deep Tendon Reflexes: Reflexes normal.      UC Treatments / Results  Labs (all labs ordered are listed, but only abnormal results are displayed) Labs Reviewed  POCT URINALYSIS DIP (MANUAL ENTRY) - Abnormal; Notable for the following components:      Result Value   Blood, UA trace-intact (*)    All other components within normal limits    EKG   Radiology No results found.  Procedures Procedures (including critical care time)  Medications Ordered in UC Medications - No data to display  Initial Impression / Assessment and Plan / UC Course  I have reviewed the triage vital signs and the nursing notes.  Pertinent labs & imaging results that were available during my care of the patient were reviewed by me and considered in my medical decision making (see chart for details).     Patient afebrile, nontoxic in office today.  POC urine dipstick done in office: Significant for trace intact blood, otherwise unremarkable.  Reviewed findings with patient who  verbalized understanding.  Previous urine dipstick was  without RBC: Discussed this could be related to possible nephrolithiasis, the patient has no history thereof, or inflammation.  Will follow up with PCP for repeat evaluation if needed.  Low back pain likely musculoskeletal: We will try ibuprofen, heating pad.  Work note provided.  Return precautions discussed, patient verbalized understanding and is agreeable to plan. Final Clinical Impressions(s) / UC Diagnoses   Final diagnoses:  Acute left-sided low back pain without sciatica     Discharge Instructions     Recommend RICE: rest, ice, compression, elevation as needed for pain.    Heat therapy (hot compress, warm wash red, hot showers, etc.) can help relax muscles and soothe muscle aches. Cold therapy (ice packs) can be used to help swelling both after injury and after prolonged use of areas of chronic pain/aches.  For pain: ibuprofen as directed.  Return for worsening pain, blood in urine, fever, numbness in groin/legs, difficulty holding bowel/bladder.    ED Prescriptions    Medication Sig Dispense Auth. Provider   ibuprofen (ADVIL) 800 MG tablet Take 1 tablet (800 mg total) by mouth 3 (three) times daily. 21 tablet Hall-Potvin, [place], PA-C     PDMP not reviewed this encounter.   Hall-Potvin, [place], [place] 09/09/19 1254

## 2019-09-13 ENCOUNTER — Other Ambulatory Visit: Payer: Self-pay | Admitting: Family

## 2019-09-16 ENCOUNTER — Ambulatory Visit: Payer: Medicaid Other | Admitting: Family

## 2019-09-16 ENCOUNTER — Other Ambulatory Visit (HOSPITAL_COMMUNITY)
Admission: RE | Admit: 2019-09-16 | Discharge: 2019-09-16 | Disposition: A | Discharge status: Home / Self Care | Payer: Medicaid Other | Source: Ambulatory Visit | Attending: Family | Admitting: Family

## 2019-09-16 ENCOUNTER — Ambulatory Visit
Admission: RE | Admit: 2019-09-16 | Discharge: 2019-09-16 | Disposition: A | Discharge status: Home / Self Care | Payer: Medicaid Other | Source: Ambulatory Visit | Attending: Family | Admitting: Family

## 2019-09-16 ENCOUNTER — Encounter: Payer: Self-pay | Admitting: Family

## 2019-09-16 ENCOUNTER — Ambulatory Visit: Payer: Medicaid Other

## 2019-09-16 ENCOUNTER — Other Ambulatory Visit: Payer: Self-pay

## 2019-09-16 VITALS — BP 138/84 | HR 83 | Temp 98.3°F | Ht 65.0 in | Wt 172.6 lb

## 2019-09-16 DIAGNOSIS — Z124 Encounter for screening for malignant neoplasm of cervix: Secondary | ICD-10-CM

## 2019-09-16 DIAGNOSIS — Z13220 Encounter for screening for lipoid disorders: Secondary | ICD-10-CM | POA: Diagnosis not present

## 2019-09-16 DIAGNOSIS — E559 Vitamin D deficiency, unspecified: Secondary | ICD-10-CM | POA: Diagnosis not present

## 2019-09-16 DIAGNOSIS — E041 Nontoxic single thyroid nodule: Secondary | ICD-10-CM | POA: Diagnosis not present

## 2019-09-16 DIAGNOSIS — I10 Essential (primary) hypertension: Secondary | ICD-10-CM

## 2019-09-16 DIAGNOSIS — N644 Mastodynia: Secondary | ICD-10-CM

## 2019-09-16 LAB — LIPID PANEL
Cholesterol: 124 mg/dL (ref 0–200)
HDL: 66.5 mg/dL (ref >39.00)
LDL Cholesterol: 52 mg/dL (ref 0–99)
NonHDL: 57.19
Total CHOL/HDL Ratio: 2
Triglycerides: 25 mg/dL (ref 0.0–149.0)
VLDL: 5 mg/dL (ref 0.0–40.0)

## 2019-09-16 LAB — CBC WITH DIFFERENTIAL/PLATELET
Basophils Absolute: 0.1 10*3/uL (ref 0.0–0.1)
Basophils Relative: 0.8 % (ref 0.0–3.0)
Eosinophils Absolute: 0.1 10*3/uL (ref 0.0–0.7)
Eosinophils Relative: 1.4 % (ref 0.0–5.0)
HCT: 37.8 % (ref 36.0–46.0)
Hemoglobin: 12.4 g/dL (ref 12.0–15.0)
Lymphocytes Relative: 29.3 % (ref 12.0–46.0)
Lymphs Abs: 2.1 10*3/uL (ref 0.7–4.0)
MCHC: 32.8 g/dL (ref 30.0–36.0)
MCV: 85.8 fl (ref 78.0–100.0)
Monocytes Absolute: 0.5 10*3/uL (ref 0.1–1.0)
Monocytes Relative: 7.3 % (ref 3.0–12.0)
Neutro Abs: 4.5 10*3/uL (ref 1.4–7.7)
Neutrophils Relative %: 61.2 % (ref 43.0–77.0)
Platelets: 267 10*3/uL (ref 150.0–400.0)
RBC: 4.4 Mil/uL (ref 3.87–5.11)
RDW: 13.3 % (ref 11.5–15.5)
WBC: 7.3 10*3/uL (ref 4.0–10.5)

## 2019-09-16 LAB — COMPREHENSIVE METABOLIC PANEL
ALT: 15 U/L (ref 0–35)
AST: 20 U/L (ref 0–37)
Albumin: 4.3 g/dL (ref 3.5–5.2)
Alkaline Phosphatase: 55 U/L (ref 39–117)
BUN: 14 mg/dL (ref 6–23)
CO2: 27 mEq/L (ref 19–32)
Calcium: 9.5 mg/dL (ref 8.4–10.5)
Chloride: 103 mEq/L (ref 96–112)
Creatinine, Ser: 0.79 mg/dL (ref 0.40–1.20)
GFR: 99.35 mL/min (ref >60.00)
Glucose, Bld: 82 mg/dL (ref 70–99)
Potassium: 4 mEq/L (ref 3.5–5.1)
Sodium: 139 mEq/L (ref 135–145)
Total Bilirubin: 0.5 mg/dL (ref 0.2–1.2)
Total Protein: 8.1 g/dL (ref 6.0–8.3)

## 2019-09-16 LAB — VITAMIN D 25 HYDROXY (VIT D DEFICIENCY, FRACTURES): VITD: 22.95 ng/mL — ABNORMAL LOW (ref 30.00–100.00)

## 2019-09-16 LAB — TSH: TSH: 0.85 u[IU]/mL (ref 0.35–4.50)

## 2019-09-16 MED ORDER — AMLODIPINE BESYLATE 10 MG PO TABS: 10.0000 mg | ORAL_TABLET | Freq: Every day | ORAL | 3 refills | Status: DC

## 2019-09-16 NOTE — Progress Notes (Signed)
Brittany [name] is a 39 y.o. female with the following history as recorded in EpicCare:  Patient Active Problem List   Diagnosis Date Noted  . Sore throat 11/01/2018  . 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 Vitamin D deficiency 12/14/2017  . Murmur, cardiac 11/30/2016  . Anemia 11/30/2016  . Migraine 03/18/2016  . Subclinical hyperthyroidism 12/09/2015  . Chronic hypertension with superimposed preeclampsia 04/09/2011    Current Outpatient Medications  Medication Sig Dispense Refill  . amLODipine (NORVASC) 10 MG tablet Take 1 tablet (10 mg total) by mouth daily. 90 tablet 3  . azelastine (ASTELIN) 0.1 % nasal spray Place 2 sprays into both nostrils 2 (two) times daily. 30 mL 0  . etonogestrel (NEXPLANON) 68 MG IMPL implant 1 each by Subdermal route once.    Marland Kitchen ibuprofen (ADVIL) 800 MG tablet Take 1 tablet (800 mg total) by mouth 3 (three) times daily. 21 tablet 0  . Multiple Vitamin (MULTIVITAMIN WITH MINERALS) TABS tablet Take 1 tablet by mouth daily.    . Cetirizine HCl 10 MG CAPS Take 1 capsule (10 mg total) by mouth daily for 10 days. 10 capsule 0  . fluticasone (FLONASE) 50 MCG/ACT nasal spray Place 1-2 sprays into both nostrils daily for 7 days. 1 g 0   No current facility-administered medications for this visit.    Allergies: Patient has no known allergies.  Past Medical History:  Diagnosis Date  . Gallstone   . Headache(784.0)   . Heart murmur   . Hypertension   . Infection    UTI  . Maternal chronic hypertension in first trimester 04/09/2011  . Pregnancy induced hypertension    after 2nd delivery  . Vaginal Pap smear, abnormal    cant remember    Past Surgical History:  Procedure Laterality Date  . DILATION AND CURETTAGE OF UTERUS     dont remember year  . LEEP      Family History  Problem Relation Age of Onset  . Asthma Mother   . Hypertension Mother   . CAD Mother   . Asthma Maternal Grandmother   . Hypertension Maternal Grandmother     Social  History   Tobacco Use  . Smoking status: Never Smoker  . Smokeless tobacco: Never Used  Substance Use Topics  . Alcohol use: No    Alcohol/week: 0.0 standard drinks    Subjective:  Requesting updated pap smear; does have Nexplanon in place- thinks it has only been in place x 1 year;  Would like to get her fasting labs updated today; notes she has not taken her blood pressure medication today but checks at home and averages 128/84. Is working on weight loss goal- trying to eat better; Is still breast-feeding;   Objective:  Vitals:   09/16/19 1052  BP: 138/84  Pulse: 83  Temp: 98.3 F (36.8 C)  TempSrc: Oral  SpO2: 96%  Weight: 172 lb 9.6 oz (78.3 kg)  Height: '5\' 5"'  (1.651 m)    General: Well developed, well nourished, in no acute distress  Skin : Warm and dry.  Head: Normocephalic and atraumatic  Oropharynx: Pink, supple. No suspicious lesions  Neck: Supple with known thyromegaly, no adenopathy  Lungs: Respirations unlabored; clear to auscultation bilaterally without wheeze, rales, rhonchi  CVS exam: normal rate and regular rhythm.  Musculoskeletal: No deformities; no active joint inflammation  Extremities: No edema, cyanosis, clubbing  Vessels: Symmetric bilaterally  Neurologic: Alert and oriented; speech intact; face symmetrical; moves all extremities well; CNII-XII intact without  focal deficit   Assessment:  1. Thyroid nodule   2. Lipid screening   3. Vitamin D deficiency   4. Cervical cancer screening   5. Essential hypertension     Plan:  1. Recent biopsy was negative but feels it affects her swallowing- refer to surgeon for 2nd opinion. 2. Check lipid panel; 3. Check Vitamin D level; 4. Pap smear updated; she understands she needs to check with her GYN about Nexplanon placement/ replacement as we cannot do that here; 5. Refill updated;  This visit occurred during the SARS-CoV-2 public health emergency.  Safety protocols were in place, including screening  questions prior to the visit, additional usage of staff PPE, and extensive cleaning of exam room while observing appropriate contact time as indicated for disinfecting solutions.     No follow-ups on file.  Orders Placed This Encounter  Procedures  . CBC w/Diff  . Comp Met (CMET)  . Vitamin D (25 hydroxy)  . Lipid panel  . TSH  . Ambulatory referral to General Surgery    Referral Priority:   Routine    Referral Type:   Surgical    Referral Reason:   Specialty Services Required    Requested Specialty:   General Surgery    Number of Visits Requested:   1    Requested Prescriptions   Signed Prescriptions Disp Refills  . amLODipine (NORVASC) 10 MG tablet 90 tablet 3    Sig: Take 1 tablet (10 mg total) by mouth daily.

## 2019-09-18 LAB — CYTOLOGY - PAP
Chlamydia: NEGATIVE
Comment: NEGATIVE
Comment: NEGATIVE
Comment: NEGATIVE
Comment: NORMAL
Diagnosis: NEGATIVE
High risk HPV: NEGATIVE
Neisseria Gonorrhea: NEGATIVE
Trichomonas: NEGATIVE

## 2019-09-20 ENCOUNTER — Encounter: Payer: Self-pay | Admitting: Family

## 2019-09-25 ENCOUNTER — Encounter: Payer: Self-pay | Admitting: Family

## 2019-09-26 ENCOUNTER — Encounter: Payer: Self-pay | Admitting: Family

## 2019-10-01 ENCOUNTER — Ambulatory Visit
Admission: EM | Admit: 2019-10-01 | Discharge: 2019-10-01 | Disposition: A | Discharge status: Home / Self Care | Payer: Medicaid Other | Attending: Physician Assistant | Admitting: Physician Assistant

## 2019-10-01 DIAGNOSIS — R519 Headache, unspecified: Secondary | ICD-10-CM | POA: Diagnosis not present

## 2019-10-01 DIAGNOSIS — Z20822 Contact with and (suspected) exposure to covid-19: Secondary | ICD-10-CM

## 2019-10-01 DIAGNOSIS — R5383 Other fatigue: Secondary | ICD-10-CM

## 2019-10-01 DIAGNOSIS — R0981 Nasal congestion: Secondary | ICD-10-CM

## 2019-10-01 MED ORDER — CETIRIZINE HCL 10 MG PO TABS: 10.0000 mg | ORAL_TABLET | Freq: Every day | ORAL | 0 refills | Status: DC

## 2019-10-01 MED ORDER — FLUTICASONE PROPIONATE 50 MCG/ACT NA SUSP: 2.0000 | Freq: Every day | NASAL | 0 refills | Status: DC

## 2019-10-01 NOTE — ED Triage Notes (Signed)
Pt c/o headache, nasal congestion and fatigue x3 days.

## 2019-10-01 NOTE — Discharge Instructions (Addendum)
COVID PCR testing ordered. I would like you to quarantine until testing results. Start zyrtec and flonase for symptoms. Tylenol/motrin for pain and fever. Keep hydrated, urine should be clear to pale yellow in color. If experiencing shortness of breath, trouble breathing, go to the emergency department for further evaluation needed.

## 2019-10-01 NOTE — ED Provider Notes (Signed)
[name]    CSN: [id number] Arrival date & time: 10/01/19  1820      History   Chief Complaint Chief Complaint  Patient presents with  . Headache    HPI Krisinda Giovanni is a 39 y.o. female.   39 year old female comes in for 3 day of URI symptoms. Has had sore throat, headache, nasal congestion, fatigue.Denies fever, chills, body aches. Denies abdominal pain, nausea, vomiting, diarrhea. Denies shortness of breath, loss of taste/smell. Daughter with similar symptoms.      Past Medical History:  Diagnosis Date  . Gallstone   . Headache(784.0)   . Heart murmur   . Hypertension   . Infection    UTI  . Maternal chronic hypertension in first trimester 04/09/2011  . Pregnancy induced hypertension    after 2nd delivery  . Vaginal Pap smear, abnormal    cant remember    Patient Active Problem List   Diagnosis Date Noted  . Sore throat 11/01/2018  . 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 Vitamin D deficiency 12/14/2017  . Murmur, cardiac 11/30/2016  . Anemia 11/30/2016  . Migraine 03/18/2016  . Subclinical hyperthyroidism 12/09/2015  . Chronic hypertension with superimposed preeclampsia 04/09/2011    Past Surgical History:  Procedure Laterality Date  . DILATION AND CURETTAGE OF UTERUS     dont remember year  . LEEP      OB History    Gravida  9   Para  4   Term  2   Preterm  2   AB  5   Living  4     SAB  4   TAB  1   Ectopic  0   Multiple  0   Live Births  4            Home Medications    Prior to Admission medications   Medication Sig Start Date End Date Taking? Authorizing Provider  amLODipine (NORVASC) 10 MG tablet Take 1 tablet (10 mg total) by mouth daily. 09/16/19   Olive Bass, FNP  azelastine (ASTELIN) 0.1 % nasal spray Place 2 sprays into both nostrils 2 (two) times daily. 07/08/19   Cathie Hoops, Venetta Knee V, PA-C  cetirizine (ZYRTEC ALLERGY) 10 MG tablet Take 1 tablet (10 mg total) by mouth daily. 10/01/19    Belinda Fisher, PA-C  etonogestrel (NEXPLANON) 68 MG IMPL implant 1 each by Subdermal route once.    [provider]  fluticasone (FLONASE) 50 MCG/ACT nasal spray Place 2 sprays into both nostrils daily. 10/01/19   Belinda Fisher, PA-C  Multiple Vitamin (MULTIVITAMIN WITH MINERALS) TABS tablet Take 1 tablet by mouth daily.    [provider]    Family History Family History  Problem Relation Age of Onset  . Asthma Mother   . Hypertension Mother   . CAD Mother   . Asthma Maternal Grandmother   . Hypertension Maternal Grandmother     Social History Social History   Tobacco Use  . Smoking status: Never Smoker  . Smokeless tobacco: Never Used  Substance Use Topics  . Alcohol use: No    Alcohol/week: 0.0 standard drinks  . Drug use: No     Allergies   Patient has no known allergies.   Review of Systems Review of Systems  Reason unable to perform ROS: See HPI as above.     Physical Exam Triage Vital Signs ED Triage Vitals [10/01/19 1846]  Enc Vitals Group  BP (!) 144/90     Pulse Rate 82     Resp 16     Temp 98.7 F (37.1 C)     Temp Source Oral     SpO2 97 %     Weight      Height      Head Circumference      Peak Flow      Pain Score      Pain Loc      Pain Edu?      Excl. in GC?    No data found.  Updated Vital Signs BP (!) 144/90 (BP Location: Left Arm)   Pulse 82   Temp 98.7 F (37.1 C) (Oral)   Resp 16   SpO2 97%   Visual Acuity Right Eye Distance:   Left Eye Distance:   Bilateral Distance:    Right Eye Near:   Left Eye Near:    Bilateral Near:     Physical Exam Constitutional:      General: She is not in acute distress.    Appearance: Normal appearance. She is not ill-appearing, toxic-appearing or diaphoretic.  HENT:     Head: Normocephalic and atraumatic.     Right Ear: Tympanic membrane, ear canal and external ear normal. Tympanic membrane is not erythematous or bulging.     Left Ear: Tympanic membrane, ear canal and  external ear normal. Tympanic membrane is not erythematous or bulging.     Nose:     Right Sinus: No maxillary sinus tenderness or frontal sinus tenderness.     Left Sinus: No maxillary sinus tenderness or frontal sinus tenderness.     Mouth/Throat:     Mouth: Mucous membranes are moist.     Pharynx: Oropharynx is clear. Uvula midline.  Eyes:     Extraocular Movements: Extraocular movements intact.     Conjunctiva/sclera: Conjunctivae normal.     Pupils: Pupils are equal, round, and reactive to light.  Cardiovascular:     Rate and Rhythm: Normal rate and regular rhythm.     Heart sounds: Normal heart sounds. No murmur. No friction rub. No gallop.   Pulmonary:     Effort: Pulmonary effort is normal. No accessory muscle usage, prolonged expiration, respiratory distress or retractions.     Comments: Lungs clear to auscultation without adventitious lung sounds. Musculoskeletal:     Cervical back: Normal range of motion and neck supple.  Neurological:     General: No focal deficit present.     Mental Status: She is alert and oriented to person, place, and time.      UC Treatments / Results  Labs (all labs ordered are listed, but only abnormal results are displayed) Labs Reviewed  NOVEL CORONAVIRUS, NAA    EKG   Radiology No results found.  Procedures Procedures (including critical care time)  Medications Ordered in UC Medications - No data to display  Initial Impression / Assessment and Plan / UC Course  I have reviewed the triage vital signs and the nursing notes.  Pertinent labs & imaging results that were available during my care of the patient were reviewed by me and considered in my medical decision making (see chart for details).    COVID PCR test ordered. Patient to quarantine until testing results return. No alarming signs on exam.  Patient speaking in full sentences without respiratory distress.  Symptomatic treatment discussed.  Push fluids.  Return precautions  given.  Patient expresses understanding and agrees to plan.  If COVID  testing negative, will need work note to be cleared to return to work.  Final Clinical Impressions(s) / UC Diagnoses   Final diagnoses:  Fatigue, unspecified type  Nasal congestion  Acute intractable headache, unspecified headache type   ED Prescriptions    Medication Sig Dispense Auth. Provider   cetirizine (ZYRTEC ALLERGY) 10 MG tablet Take 1 tablet (10 mg total) by mouth daily. 15 tablet Imane Burrough V, PA-C   fluticasone (FLONASE) 50 MCG/ACT nasal spray Place 2 sprays into both nostrils daily. 1 g Ok Edwards, PA-C     PDMP not reviewed this encounter.   Ok Edwards, PA-C 10/01/19 1906

## 2019-10-03 LAB — NOVEL CORONAVIRUS, NAA: SARS-CoV-2, NAA: NOT DETECTED

## 2019-10-11 ENCOUNTER — Ambulatory Visit
Admission: EM | Admit: 2019-10-11 | Discharge: 2019-10-11 | Disposition: A | Discharge status: Home / Self Care | Payer: Medicaid Other | Attending: Family Medicine | Admitting: Family Medicine

## 2019-10-11 ENCOUNTER — Encounter: Payer: Self-pay | Admitting: Family Medicine

## 2019-10-11 DIAGNOSIS — M7501 Adhesive capsulitis of right shoulder: Secondary | ICD-10-CM | POA: Diagnosis not present

## 2019-10-11 MED ORDER — PREDNISONE 20 MG PO TABS: ORAL_TABLET | ORAL | 0 refills | Status: DC

## 2019-10-11 NOTE — ED Provider Notes (Signed)
[name]    CSN: [id number] Arrival date & time: 10/11/19  1805      History   Chief Complaint Chief Complaint  Patient presents with  . Shoulder Pain    HPI Brittany [name] is a 39 y.o. female.   Established [facility] patient presents with right arm injury.  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  The last three days the shoulder has gotten quite sore to touch and/or move.  No fall.  No chest pain.  Patient is breast feeding.     Past Medical History:  Diagnosis Date  . Gallstone       . Heart murmur   . Hypertension   .       .    .       . Vaginal Pap smear, abnormal    cant remember    Patient Active Problem List   Diagnosis Date Noted    11/01/2018  . 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 Vitamin D deficiency 12/14/2017  . Murmur, cardiac 11/30/2016  . Anemia 11/30/2016  . Migraine 03/18/2016  . Subclinical hyperthyroidism 12/09/2015  .  04/09/2011    Past Surgical History:  Procedure Laterality Date  . DILATION AND CURETTAGE OF UTERUS     dont remember year  . LEEP      OB History    Gravida  9   Para  4   Term  2   Preterm  2   AB  5   Living  4     SAB  4   TAB  1   Ectopic  0   Multiple  0   Live Births  4            Home Medications    Prior to Admission medications   Medication Sig Start Date End Date Taking? Authorizing Provider  amLODipine (NORVASC) 10 MG tablet Take 1 tablet (10 mg total) by mouth daily. 09/16/19   Marrian Salvage, FNP  azelastine (ASTELIN) 0.1 % nasal spray Place 2 sprays into both nostrils 2 (two) times daily. 07/08/19   Tasia Catchings, Amy V, PA-C  cetirizine (ZYRTEC ALLERGY) 10 MG tablet Take 1 tablet (10 mg total) by mouth daily. 10/01/19   Ok Edwards, PA-C  etonogestrel (NEXPLANON) 68 MG IMPL implant 1 each by Subdermal route once.    [provider]  Multiple Vitamin (MULTIVITAMIN WITH MINERALS) TABS tablet Take 1 tablet by mouth daily.     [provider]  predniSONE (DELTASONE) 20 MG tablet Two daily with food 10/11/19   Robyn Haber, MD    Family History Family History  Problem Relation Age of Onset  . Asthma Mother   . Hypertension Mother   . CAD Mother   . Asthma Maternal Grandmother   . Hypertension Maternal Grandmother     Social History Social History   Tobacco Use  . Smoking status: Never Smoker  . Smokeless tobacco: Never Used  Substance Use Topics  . Alcohol use: No    Alcohol/week: 0.0 standard drinks  . Drug use: No     Allergies   Patient has no known allergies.   Review of Systems Review of Systems   Physical Exam Triage Vital Signs ED Triage Vitals  Enc Vitals Group     BP      Pulse      Resp      Temp      Temp src  SpO2      Weight      Height      Head Circumference      Peak Flow      Pain Score      Pain Loc      Pain Edu?      Excl. in GC?    No data found.  Updated Vital Signs BP (!) 147/99 (BP Location: Left Arm)   Pulse 78   Temp 98.9 F (37.2 C) (Oral)   Resp 16   SpO2 99%    Physical Exam Vitals and nursing note reviewed.  Constitutional:      Appearance: Normal appearance. She is obese.  HENT:     Head: Normocephalic.  Eyes:     Conjunctiva/sclera: Conjunctivae normal.  Cardiovascular:     Rate and Rhythm: Normal rate.  Pulmonary:     Effort: Pulmonary effort is normal.  Musculoskeletal:        General: Tenderness present.     Cervical back: Normal range of motion and neck supple. No tenderness.     Comments: Holding right shoulder to immobilize Tender right AC and acromial areas No STS or ecchymosis Any motion results in pain  Skin:    General: Skin is warm and dry.  Neurological:     General: No focal deficit present.     Mental Status: She is alert and oriented to person, place, and time.  Psychiatric:        Mood and Affect: Mood normal.        Behavior: Behavior normal.        Thought Content: Thought content  normal.        Judgment: Judgment normal.      UC Treatments / Results  Labs (all labs ordered are listed, but only abnormal results are displayed) Labs Reviewed - No data to display  EKG   Radiology No results found.  Procedures Procedures (including critical care time)  Medications Ordered in UC Medications - No data to display  Initial Impression / Assessment and Plan / UC Course  I have reviewed the triage vital signs and the nursing notes.  Pertinent labs & imaging results that were available during my care of the patient were reviewed by me and considered in my medical decision making (see chart for details).    Final Clinical Impressions(s) / UC Diagnoses   Final diagnoses:  Adhesive capsulitis of right shoulder     Discharge Instructions     You should see improvement over the next 48 hours.  Take the prednisone with food twice daily.  Use heat at night to ease the pain    ED Prescriptions    Medication Sig Dispense Auth. Provider   predniSONE (DELTASONE) 20 MG tablet Two daily with food 10 tablet Elvina Sidle, MD     I have reviewed the PDMP during this encounter.   Elvina Sidle, MD 10/11/19 Silva Bandy

## 2019-10-11 NOTE — ED Triage Notes (Signed)
Pt c/o rt should/arm pain x3 days and worse today. 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

## 2019-10-11 NOTE — Discharge Instructions (Addendum)
You should see improvement over the next 48 hours.  Take the prednisone with food twice daily.  Use heat at night to ease the pain

## 2019-10-15 ENCOUNTER — Encounter (HOSPITAL_COMMUNITY): Payer: Self-pay | Admitting: Emergency Medicine

## 2019-10-15 ENCOUNTER — Ambulatory Visit (HOSPITAL_COMMUNITY)
Admission: EM | Admit: 2019-10-15 | Discharge: 2019-10-15 | Disposition: A | Discharge status: Home / Self Care | Payer: Medicaid Other | Attending: Family Medicine | Admitting: Family Medicine

## 2019-10-15 ENCOUNTER — Other Ambulatory Visit: Payer: Self-pay

## 2019-10-15 ENCOUNTER — Encounter: Payer: Self-pay | Admitting: Family

## 2019-10-15 DIAGNOSIS — M25511 Pain in right shoulder: Secondary | ICD-10-CM

## 2019-10-15 MED ORDER — KETOROLAC TROMETHAMINE 30 MG/ML IJ SOLN
30.0000 mg | Freq: Once | INTRAMUSCULAR | Status: AC
  Administered 2019-10-15: 30 mg via INTRAMUSCULAR

## 2019-10-15 MED ORDER — KETOROLAC TROMETHAMINE 30 MG/ML IJ SOLN
INTRAMUSCULAR | Status: AC
  Filled 2019-10-15: qty 1

## 2019-10-15 NOTE — ED Provider Notes (Signed)
[facility]    CSN: [id number] Arrival date & time: 10/15/19  1117      History   Chief Complaint Chief Complaint  Patient presents with  . Shoulder Pain    HPI Brittany [name] is a 39 y.o. female.   Pt is a 39 year old female that presents with continued right shoulder pain that is severe. Reports that she has been taking ibuprofen without relief. She was prescribed prednisone 2 days ago but worried about taking this due to breast feeding. She is breast feeding and bottle feeding her 37 year old. She also tried a muscle relaxer but she reports that this just makes her and her baby sleepy and doesn't help with the pain. No radiation of pain, numbness or tingling. No weakness. No specific injury.   ROS per HPI      Past Medical History:  Diagnosis Date  . Gallstone   . Headache(784.0)   . Heart murmur   . Hypertension   . Infection    UTI  . Maternal chronic hypertension in first trimester 04/09/2011  . Pregnancy induced hypertension    after 2nd delivery  . Vaginal Pap smear, abnormal    cant remember    Patient Active Problem List   Diagnosis Date Noted  . Sore throat 11/01/2018  . 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 Vitamin D deficiency 12/14/2017  . Murmur, cardiac 11/30/2016  . Anemia 11/30/2016  . Migraine 03/18/2016  . Subclinical hyperthyroidism 12/09/2015  . Chronic hypertension with superimposed preeclampsia 04/09/2011    Past Surgical History:  Procedure Laterality Date  . DILATION AND CURETTAGE OF UTERUS     dont remember year  . LEEP      OB History    Gravida  9   Para  4   Term  2   Preterm  2   AB  5   Living  4     SAB  4   TAB  1   Ectopic  0   Multiple  0   Live Births  4            Home Medications    Prior to Admission medications   Medication Sig Start Date End Date Taking? Authorizing Provider  amLODipine (NORVASC) 10 MG tablet Take 1 tablet (10 mg total) by mouth daily. 09/16/19    Marrian Salvage, FNP  azelastine (ASTELIN) 0.1 % nasal spray Place 2 sprays into both nostrils 2 (two) times daily. 07/08/19   Tasia Catchings, Amy V, PA-C  cetirizine (ZYRTEC ALLERGY) 10 MG tablet Take 1 tablet (10 mg total) by mouth daily. 10/01/19   Ok Edwards, PA-C  etonogestrel (NEXPLANON) 68 MG IMPL implant 1 each by Subdermal route once.    [provider]  Multiple Vitamin (MULTIVITAMIN WITH MINERALS) TABS tablet Take 1 tablet by mouth daily.    [provider]  predniSONE (DELTASONE) 20 MG tablet Two daily with food Patient not taking: Reported on 10/15/2019 10/11/19   Robyn Haber, MD    Family History Family History  Problem Relation Age of Onset  . Asthma Mother   . Hypertension Mother   . CAD Mother   . Asthma Maternal Grandmother   . Hypertension Maternal Grandmother     Social History Social History   Tobacco Use  . Smoking status: Never Smoker  . Smokeless tobacco: Never Used  Substance Use Topics  . Alcohol use: No    Alcohol/week: 0.0 standard drinks  .  Drug use: No     Allergies   Patient has no known allergies.   Review of Systems Review of Systems   Physical Exam Triage Vital Signs ED Triage Vitals  Enc Vitals Group     BP 10/15/19 1230 (!) 154/84     Pulse Rate 10/15/19 1230 96     Resp 10/15/19 1230 18     Temp 10/15/19 1230 98.2 F (36.8 C)     Temp Source 10/15/19 1230 Oral     SpO2 10/15/19 1230 97 %     Weight --      Height --      Head Circumference --      Peak Flow --      Pain Score 10/15/19 1232 8     Pain Loc --      Pain Edu? --      Excl. in GC? --    No data found.  Updated Vital Signs BP (!) 154/84 (BP Location: Right Arm)   Pulse 96   Temp 98.2 F (36.8 C) (Oral)   Resp 18   SpO2 97%   Visual Acuity Right Eye Distance:   Left Eye Distance:   Bilateral Distance:    Right Eye Near:   Left Eye Near:    Bilateral Near:     Physical Exam Vitals and nursing note reviewed.  Constitutional:       General: She is not in acute distress.    Appearance: Normal appearance. She is not ill-appearing, toxic-appearing or diaphoretic.  HENT:     Head: Normocephalic.     Nose: Nose normal.  Eyes:     Conjunctiva/sclera: Conjunctivae normal.  Pulmonary:     Effort: Pulmonary effort is normal.  Musculoskeletal:     Right shoulder: Tenderness and bony tenderness present. No swelling, deformity, effusion or laceration. Decreased range of motion.     Cervical back: Normal range of motion.  Skin:    General: Skin is warm and dry.     Findings: No rash.  Neurological:     Mental Status: She is alert.  Psychiatric:        Mood and Affect: Mood normal.      UC Treatments / Results  Labs (all labs ordered are listed, but only abnormal results are displayed) Labs Reviewed - No data to display  EKG   Radiology No results found.  Procedures Procedures (including critical care time)  Medications Ordered in UC Medications  ketorolac (TORADOL) 30 MG/ML injection 30 mg (30 mg Intramuscular Given 10/15/19 1253)    Initial Impression / Assessment and Plan / UC Course  I have reviewed the triage vital signs and the nursing notes.  Pertinent labs & imaging results that were available during my care of the patient were reviewed by me and considered in my medical decision making (see chart for details).     Shoulder pain- second visit for this in the past few days. The pain is worse and she is taking ibuprofen without relief. Pt hesitant about taking prednisone due to breast feeding.  recommended not breast feeding and bottle feeding so we can treat her pain. She is still breast feeding her one year old but she is also bottle feeding. She can take the prednisone every morning before she goes to work starting tomorrow. Toradol given here for pain today. Precautions given.  Recommended not breast feeding and bottle feeding for at least 24 hours if she can.  Tylenol for additional pain.  Follow up as needed for continued or worsening symptoms  Final Clinical Impressions(s) / UC Diagnoses   Final diagnoses:  Pain in joint of right shoulder     Discharge Instructions     Toradol given here for pain Take the prednisone daily that you are prescribed.  Take this with food. Take this before you go to work to give times you are able to breast-feed Try to do some gentle range of motion exercises with the shoulder. Rest, ice the shoulder  Follow up as needed for continued or worsening symptoms     ED Prescriptions    None     I have reviewed the PDMP during this encounter.   Janace Aris, NP 10/15/19 1338

## 2019-10-15 NOTE — ED Triage Notes (Signed)
Pt here for right shoulder pain x 4 days; pt sts lifts heavy things at work but unaware of any obvious injury

## 2019-10-15 NOTE — Discharge Instructions (Addendum)
Toradol given here for pain Take the prednisone daily that you are prescribed.  Take this with food. Take this before you go to work to give times you are able to breast-feed Try to do some gentle range of motion exercises with the shoulder. Rest, ice the shoulder  Follow up as needed for continued or worsening symptoms

## 2019-10-22 ENCOUNTER — Other Ambulatory Visit: Payer: Self-pay

## 2019-10-22 ENCOUNTER — Ambulatory Visit (HOSPITAL_COMMUNITY)
Admission: EM | Admit: 2019-10-22 | Discharge: 2019-10-22 | Disposition: A | Discharge status: Home / Self Care | Payer: Medicaid Other | Attending: Physician Assistant | Admitting: Physician Assistant

## 2019-10-22 ENCOUNTER — Ambulatory Visit (INDEPENDENT_AMBULATORY_CARE_PROVIDER_SITE_OTHER): Payer: Medicaid Other

## 2019-10-22 ENCOUNTER — Encounter (HOSPITAL_COMMUNITY): Payer: Self-pay

## 2019-10-22 DIAGNOSIS — M25511 Pain in right shoulder: Secondary | ICD-10-CM

## 2019-10-22 MED ORDER — DICLOFENAC SODIUM 1 % EX GEL: 4.0000 g | Freq: Four times a day (QID) | CUTANEOUS | 0 refills | Status: DC

## 2019-10-22 NOTE — Discharge Instructions (Signed)
Call the sports medicine group to see when you can be seen.  If you cannot be seen in the next few days likely to follow-up with orthopedic urgent care as discussed.  Try the Voltaren gel up to 4 times a day.  Use the sling if this helps your pain.

## 2019-10-22 NOTE — ED Triage Notes (Signed)
Pt states she has right shoulder pain x 1 week. Pt states she woke up with this pain.

## 2019-10-22 NOTE — ED Provider Notes (Signed)
[place]    CSN: [id number] Arrival date & time: 10/22/19  1342      History   Chief Complaint Chief Complaint  Patient presents with  . Shoulder Pain    HPI Brittany [name] is a 39 y.o. female.   Patient reports for continued right shoulder pain.  She was seen on 10/11/2019 and 10/15/2019 for right shoulder pain.  She has been trialed on prednisone, ibuprofen and Toradol injections.  She reports none of these have helped her.  She was trialed in a sling for pain relief and reports this minimally helps.  She reports pain is gradually increasing.  She reports this is causing issues with sleep at night.  She reports she is actively breast-feeding and her child does not supplement with formula so she has no abdominal pain management options.  She denies any numbness or tingling in the arm.  She reports limited mobility due to pain.  Denies any recent fevers or chills.  Denies other joint pain.  Denies neck pain.  Denies other symptoms such as recent illness or abdominal pains.  At onset she had been lifting heavy tea containers at work frequently.  She denies any single episode where she felt the pain start that it gradually came on.  Of note patient has been seen multiple times over the last week and a half to include multiple visits with [facility] and outside systems.     Past Medical History:  Diagnosis Date  . Gallstone   . Headache(784.0)   . Heart murmur   . Hypertension   . Infection    UTI  . Maternal chronic hypertension in first trimester 04/09/2011  . Pregnancy induced hypertension    after 2nd delivery  . Vaginal Pap smear, abnormal    cant remember    Patient Active Problem List   Diagnosis Date Noted  . Sore throat 11/01/2018  . 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 Vitamin D deficiency 12/14/2017  . Murmur, cardiac 11/30/2016  . Anemia 11/30/2016  . Migraine 03/18/2016  . Subclinical hyperthyroidism 12/09/2015  . Chronic hypertension  with superimposed preeclampsia 04/09/2011    Past Surgical History:  Procedure Laterality Date  . DILATION AND CURETTAGE OF UTERUS     dont remember year  . LEEP      OB History    Gravida  9   Para  4   Term  2   Preterm  2   AB  5   Living  4     SAB  4   TAB  1   Ectopic  0   Multiple  0   Live Births  4            Home Medications    Prior to Admission medications   Medication Sig Start Date End Date Taking? Authorizing Provider  amLODipine (NORVASC) 10 MG tablet Take 1 tablet (10 mg total) by mouth daily. 09/16/19   Marrian Salvage, FNP  azelastine (ASTELIN) 0.1 % nasal spray Place 2 sprays into both nostrils 2 (two) times daily. 07/08/19   Tasia Catchings, Amy V, PA-C  cetirizine (ZYRTEC ALLERGY) 10 MG tablet Take 1 tablet (10 mg total) by mouth daily. 10/01/19   Tasia Catchings, Amy V, PA-C  diclofenac Sodium (VOLTAREN) 1 % GEL Apply 4 g topically 4 (four) times daily. 10/22/19   Lillionna Nabi, Marguerita Beards, PA-C  etonogestrel (NEXPLANON) 68 MG IMPL implant 1 each by Subdermal route once.    [provider]  Multiple Vitamin (MULTIVITAMIN WITH MINERALS) TABS tablet Take 1 tablet by mouth daily.    [provider]  predniSONE (DELTASONE) 20 MG tablet Two daily with food Patient not taking: Reported on 10/15/2019 10/11/19   Elvina Sidle, MD    Family History Family History  Problem Relation Age of Onset  . Asthma Mother   . Hypertension Mother   . CAD Mother   . Asthma Maternal Grandmother   . Hypertension Maternal Grandmother     Social History Social History   Tobacco Use  . Smoking status: Never Smoker  . Smokeless tobacco: Never Used  Substance Use Topics  . Alcohol use: No    Alcohol/week: 0.0 standard drinks  . Drug use: No     Allergies   Patient has no known allergies.   Review of Systems Review of Systems  Constitutional: Negative for chills and fever.  Musculoskeletal:       See HPI  Skin: Negative for color change and rash.   Neurological: Negative for weakness and numbness.     Physical Exam Triage Vital Signs ED Triage Vitals  Enc Vitals Group     BP 10/22/19 1354 135/84     Pulse Rate 10/22/19 1354 82     Resp 10/22/19 1354 18     Temp 10/22/19 1354 98.8 F (37.1 C)     Temp Source 10/22/19 1354 Oral     SpO2 10/22/19 1354 100 %     Weight 10/22/19 1351 174 lb (78.9 kg)     Height --      Head Circumference --      Peak Flow --      Pain Score 10/22/19 1351 10     Pain Loc --      Pain Edu? --      Excl. in GC? --    No data found.  Updated Vital Signs BP 135/84 (BP Location: Right Arm)   Pulse 82   Temp 98.8 F (37.1 C) (Oral)   Resp 18   Wt 174 lb (78.9 kg)   SpO2 100%   BMI 28.96 kg/m   Visual Acuity Right Eye Distance:   Left Eye Distance:   Bilateral Distance:    Right Eye Near:   Left Eye Near:    Bilateral Near:     Physical Exam Vitals and nursing note reviewed.  Constitutional:      Appearance: She is well-developed.     Comments: Patient seated on the exam table cradling right arm for comfort.  She is occasionally teary-eyed due to pain.  HENT:     Head: Normocephalic and atraumatic.  Eyes:     Conjunctiva/sclera: Conjunctivae normal.  Cardiovascular:     Rate and Rhythm: Normal rate and regular rhythm.     Heart sounds: No murmur.  Pulmonary:     Effort: Pulmonary effort is normal. No respiratory distress.     Breath sounds: Normal breath sounds.  Abdominal:     Palpations: Abdomen is soft.     Tenderness: There is no abdominal tenderness.  Musculoskeletal:     Cervical back: Neck supple.     Comments: There is no obvious deformity ecchymosis or swelling of the right shoulder.  Joint is not warm.  There is tenderness over the superior aspect of the distal scapula, AC joint and anterior shoulder.  Exam severely limited due to elicited pain with movement.  Patient does have positive pain with passive range of motion of the shoulder.  There  is pain with  resisted supination of the elbow.  Left shoulder nontender.  Elbow is nontender wrist nontender.  Skin:    General: Skin is warm and dry.  Neurological:     General: No focal deficit present.     Mental Status: She is alert and oriented to person, place, and time.     Sensory: No sensory deficit.      UC Treatments / Results  Labs (all labs ordered are listed, but only abnormal results are displayed) Labs Reviewed - No data to display  EKG   Radiology DG Shoulder Right  Result Date: 10/22/2019 CLINICAL DATA:  Severe right shoulder pain for 1 week. Progressive over the last week, onset after lifting drugs at work. EXAM: RIGHT SHOULDER - 2+ VIEW COMPARISON:  None. FINDINGS: There is no evidence of fracture or dislocation. There is no evidence of arthropathy or other focal bone abnormality. Soft tissues are unremarkable. IMPRESSION: Negative radiographs of the right shoulder. Electronically Signed   By: Narda Rutherford M.D.   On: 10/22/2019 14:36    Procedures Procedures (including critical care time)  Medications Ordered in UC Medications - No data to display  Initial Impression / Assessment and Plan / UC Course  I have reviewed the triage vital signs and the nursing notes.  Pertinent labs & imaging results that were available during my care of the patient were reviewed by me and considered in my medical decision making (see chart for details).     #Right shoulder pain Patient is a 39 year old presenting with continued right shoulder pain.  She has had no relief with steroid or NSAID conservative therapies.  She is unable to take any stronger pain medicines currently Due to strict breast-feeding.  We obtained an x-ray today to rule out any bony pathology though was unlikely would find any as there is been no trauma.  No evidence of subacromial spur.  At this point differential would include adhesive capsulitis vs rotator cuff tear/tendinopathy vs biceps tendinopathy vs  bursitis.  We discussed that the next follow-up should be with either the sports medicine group or in orthopedics office.  I discussed calling the sports medicine group to see when she can be seen however also discussed given the amount of pain and distress this is causing her that if she were not to be able to be seen in the next few days that she should likely report to orthopedic urgent care.  Patient is amenable this plan and will call the sports medicine group first and if she is unable to be seen this week she would likely report to orthopedic urgent care. -Sports medicine group information given -Discussed [facility] urgent care hours. -We will trial diclofenac topical cream.   Final Clinical Impressions(s) / UC Diagnoses   Final diagnoses:  Acute pain of right shoulder     Discharge Instructions     Call the sports medicine group to see when you can be seen.  If you cannot be seen in the next few days likely to follow-up with orthopedic urgent care as discussed.  Try the Voltaren gel up to 4 times a day.  Use the sling if this helps your pain.    ED Prescriptions    Medication Sig Dispense Auth. Provider   diclofenac Sodium (VOLTAREN) 1 % GEL Apply 4 g topically 4 (four) times daily. 50 g Avayah Raffety, Veryl Speak, PA-C     PDMP not reviewed this encounter.   Hermelinda Medicus, PA-C 10/22/19 1502

## 2019-10-23 ENCOUNTER — Ambulatory Visit: Payer: Medicaid Other | Admitting: Sports Medicine

## 2019-10-23 ENCOUNTER — Ambulatory Visit: Payer: Self-pay

## 2019-10-23 ENCOUNTER — Encounter: Payer: Self-pay | Admitting: Sports Medicine

## 2019-10-23 VITALS — BP 124/82 | Ht 65.0 in | Wt 175.0 lb

## 2019-10-23 DIAGNOSIS — G8929 Other chronic pain: Secondary | ICD-10-CM

## 2019-10-23 DIAGNOSIS — M25511 Pain in right shoulder: Secondary | ICD-10-CM | POA: Diagnosis not present

## 2019-10-23 MED ORDER — METHYLPREDNISOLONE ACETATE 40 MG/ML IJ SUSP
40.0000 mg | Freq: Once | INTRAMUSCULAR | Status: AC
  Administered 2019-10-23: 40 mg via INTRA_ARTICULAR

## 2019-10-23 NOTE — Progress Notes (Addendum)
   [facility] [address] [place], [place] [address] Phone: [phone] Fax: [phone]   Patient Name: Brittany [name] Date of Birth: July 02, 1981 Medical Record Number: [id number] Gender: female Date of Encounter: 10/23/2019  SUBJECTIVE:      Chief Complaint:  Right shoulder pain   HPI:  Brittany [name] is a 39 year old RHD F presenting with 2 weeks of acute right shoulder pain.  There is no specific mechanism of injury or fall, she just had sharp pain in her right shoulder that has not gotten better.  She has tried a Toradol injection, prednisone by mouth, diclofenac cream, and even was given a sling for a little bit.  None of this seemed to offer much relief. She has never injured this shoulder before.  She works as a Production designer, theatre/television/film at a [employer] also does do some lifting, but does not recall a specific incident where could have happened.  She denies any history of diabetes or thyroid disease.   ROS:     See HPI.   PERTINENT  PMH / PSH / FH / SH:  Past Medical, Surgical, Social, and Family History Reviewed & Updated in the EMR. Pertinent findings include:  Subclinical hyperthyroidism   OBJECTIVE:  BP 124/82   Ht 5\' 5"  (1.651 m)   Wt 175 lb (79.4 kg)   BMI 29.12 kg/m  Physical Exam:  Vital signs are reviewed.   GEN: Alert and oriented, NAD Pulm: Breathing unlabored PSY: normal mood, congruent affect  MSK: Right shoulder Well developed, well nourished, in no acute distress. No swelling, ecchymoses.  No gross deformity. TTP over lateral shoulder and AC joint Patient struggles with active range of motion to achieve even 90 degrees of flexion and abduction.  Biceps strength and ROM is full and intact Unable to properly assess strength testing secondary to pain Positive Neers. Negative Yergasons. NV intact distally.  Limited MSK Ultrasound: Right shoulder No evidence of joint effusion.   The biceps brachii long head tendon is normal without  tendinosis, tear, tenosynovitis, or subluxation/dislocation in short and long axis view. 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  A 2:4 mixture of methylprednisolone and 1% lidocaine was injected into the right subacromial bursa.  The patient tolerated the procedure well without any complication.  All postop instructions were given.  ASSESSMENT & PLAN:   1. Right shoulder pain  Successful injection as above.  I have also given patient a formal prescription for physical therapy.  She was provided a work note that states no overhead activity.  She can continue to use topical Voltaren and over-the-counter anti-inflammatory as needed.  She will follow-up with in 1 month or sooner if symptoms worsen.  [place], DO, ATC Sports Medicine Fellow  Addendum:  I was the preceptor for this visit and available for immediate consultation.  Judge Stall MD [facility]

## 2019-10-23 NOTE — Addendum Note (Signed)
Addended by: Rutha Bouchard E on: 10/23/2019 10:03 AM   Modules accepted: Orders

## 2019-11-11 ENCOUNTER — Ambulatory Visit: Payer: Medicaid Other | Attending: Sports Medicine | Admitting: Physical Therapy

## 2019-11-11 ENCOUNTER — Encounter: Payer: Self-pay | Admitting: Family

## 2019-11-11 ENCOUNTER — Encounter: Payer: Self-pay | Admitting: Physical Therapy

## 2019-11-11 ENCOUNTER — Other Ambulatory Visit: Payer: Self-pay | Admitting: Family

## 2019-11-11 ENCOUNTER — Other Ambulatory Visit: Payer: Self-pay

## 2019-11-11 DIAGNOSIS — G8929 Other chronic pain: Secondary | ICD-10-CM | POA: Diagnosis present

## 2019-11-11 DIAGNOSIS — M6281 Muscle weakness (generalized): Secondary | ICD-10-CM | POA: Diagnosis present

## 2019-11-11 DIAGNOSIS — M25611 Stiffness of right shoulder, not elsewhere classified: Secondary | ICD-10-CM

## 2019-11-11 DIAGNOSIS — R293 Abnormal posture: Secondary | ICD-10-CM | POA: Diagnosis present

## 2019-11-11 DIAGNOSIS — M25511 Pain in right shoulder: Secondary | ICD-10-CM | POA: Insufficient documentation

## 2019-11-11 MED ORDER — AMLODIPINE BESYLATE 10 MG PO TABS: 10.0000 mg | ORAL_TABLET | Freq: Every day | ORAL | 3 refills | Status: DC

## 2019-11-11 NOTE — Therapy (Signed)
[facility] [address] [place], [place], [address] Phone: [phone]   Fax:  [phone]  Physical Therapy Evaluation  Patient Details  Name: Brittany [name] MRN: [id number] Date of Birth: 1981/01/06 Referring Provider (PT): Judge Stall, DO   Encounter Date: 11/11/2019  PT End of Session - 11/11/19 1604    Visit Number  1    Number of Visits  16    Date for PT Re-Evaluation  01/06/20    Authorization Type  MCD    PT Start Time  1510    PT Stop Time  1553    PT Time Calculation (min)  43 min    Activity Tolerance  Patient limited by pain    Behavior During Therapy  [facility] for tasks assessed/performed       Past Medical History:  Diagnosis Date  . Gallstone   . Headache(784.0)   . Heart murmur   . Hypertension   . Infection    UTI  . Maternal chronic hypertension in first trimester 04/09/2011  . Pregnancy induced hypertension    after 2nd delivery  . Vaginal Pap smear, abnormal    cant remember    Past Surgical History:  Procedure Laterality Date  . DILATION AND CURETTAGE OF UTERUS     dont remember year  . LEEP      There were no vitals filed for this visit.   Subjective Assessment - 11/11/19 1513    Subjective  Patient reports right shoulder pain. She is having soreness with all movement. She believes that lifting heavy containers of tea at work over-and-over again made it start hurting. She did have an injection a few weeks ago that she believes helped a little bit. She was told not to lift any greater than 20 lbs until she followed up with the doctor.    Limitations  House hold activities;Lifting;Sitting    How long can you sit comfortably?  No limitation    Diagnostic tests  X-ray, MSK [place]    Patient Stated Goals  Get rid of pain so she can work without limitation    Currently in Pain?  Yes    Pain Score  8     Pain Location  Shoulder    Pain Orientation  Right    Pain Descriptors / Indicators  Sore     Pain Type  Chronic pain    Pain Onset  More than a month ago    Pain Frequency  Constant    Aggravating Factors   Any movement of the right shoulder    Pain Relieving Factors  Nothing helps    Effect of Pain on Daily Activities  Patient is limited in lifting anything overhead         [facility] PT Assessment - 11/11/19 0001      Assessment   Medical Diagnosis  Chronic right shoulder pain    Referring Provider (PT)  Maneen, Dominic, DO    Onset Date/Surgical Date  --   around 4 weeks ago   Hand Dominance  Right    Next MD Visit  11/27/2019    Prior Therapy  Yes- right ankle      Precautions   Precaution Comments  She is not able to lift > 20 lbs while at work      Restrictions   Weight Bearing Restrictions  No      Balance Screen   Has the patient fallen in the past 6 months  No  Has the patient had a decrease in activity level because of a fear of falling?   No    Is the patient reluctant to leave their home because of a fear of falling?   No      Home Public house manager residence    Living Arrangements  Spouse/significant other;Children      Prior Function   Level of Independence  Independent    Vocation  Full time employment    Investment banker, operational at [employer]    Leisure  None reported      Cognition   Overall Cognitive Status  Within Functional Limits for tasks assessed      Observation/Other Assessments   Observations  Patient appears in no apparent distress    Focus on Therapeutic Outcomes (FOTO)   NA - MCD      Sensation   Light Touch  Appears Intact      Posture/Postural Control   Posture Comments  Patient exhibits rounded shoulder and forward head posture, right shoulder guarded with increased shrug compared to left side      ROM / Strength   AROM / PROM / Strength  AROM;PROM;Strength      AROM   AROM Assessment Site  Shoulder    Right/Left Shoulder  Right;Left    Right Shoulder Flexion  125 Degrees    Right  Shoulder ABduction  90 Degrees    Right Shoulder Internal Rotation  --   Reach to greater trochanter   Right Shoulder External Rotation  30 Degrees   Unable to reach overhead   Left Shoulder Flexion  170 Degrees    Left Shoulder ABduction  170 Degrees    Left Shoulder Internal Rotation  --   T12   Left Shoulder External Rotation  70 Degrees   T4     PROM   Overall PROM Comments  Unable to fully assess due to patient guarding and increased pain, tried to perform elbow PROM also and patient reported significant increase in shoulder pain    PROM Assessment Site  Shoulder      Strength   Overall Strength Comments  Not tested due to significant pain with AROM    Strength Assessment Site  Shoulder      Flexibility   Soft Tissue Assessment /Muscle Length  yes   right upper trap and levator scap tightness     Palpation   Palpation comment  TTP over AC joint region, anterior and lateral periacromial region      Special Tests   Other special tests  Unable to perform due to high pain level      Transfers   Transfers  Independent with all Transfers                Objective measurements completed on examination: See above findings.      [facility] Adult PT Treatment/Exercise - 11/11/19 0001      Exercises   Exercises  Shoulder      Shoulder Exercises: Seated   Retraction  10 reps   3 sec hold   Retraction Limitations  cued to avoid shrug, slow and controlled movement    External Rotation  10 reps;AAROM   10 sec hold   External Rotation Limitations  use of dowel with towel under arm    Flexion  10 reps;AAROM   10 sec hold   Flexion Limitations  forward table slide      Shoulder Exercises: Stretch  Other Shoulder Stretches  Upper trap and levator scap stretch x20 sec each      Modalities   Modalities  Electrical Stimulation;Moist Heat      Moist Heat Therapy   Number Minutes Moist Heat  10 Minutes    Moist Heat Location  Shoulder   right     Electrical  Stimulation   Electrical Stimulation Location  Right shoulder    Electrical Stimulation Action  IFC 80-150 x 10 min    Electrical Stimulation Parameters  Patient tolerance for intensity    Electrical Stimulation Goals  Pain             PT Education - 11/11/19 1545    Education Details  Exam findings, POC, HEP, TENS unit and heat/ice use for home to control pain    Person(s) Educated  Patient    Methods  Explanation;Demonstration;Tactile cues;Verbal cues;Handout    Comprehension  Verbalized understanding;Returned demonstration;Verbal cues required;Tactile cues required;Need further instruction       PT Short Term Goals - 11/11/19 1617      PT SHORT TERM GOAL #1   Title  Patient will be I with initial HEP to progress in PT    Baseline  Patient given HEP at evaluation    Time  4    Period  Weeks    Status  New    Target Date  12/09/19      PT SHORT TERM GOAL #2   Title  Patient will exhibit right shoulder flexion AROM >/= 150 deg to improve ability to reach shelves    Baseline  125 deg    Time  4    Period  Weeks    Status  New    Target Date  12/09/19      PT SHORT TERM GOAL #3   Title  Patient will report ability to perform all grooming and self care tasks with little or no difficulty    Baseline  Moderate difficulty    Time  4    Period  Weeks    Status  New    Target Date  12/09/19      PT SHORT TERM GOAL #4   Title  Patient will report resting right shoulder pain level </= 5/10 to reduce pain limitation and improve functional ability    Baseline  8/10    Time  4    Period  Weeks    Status  New    Target Date  12/09/19        PT Long Term Goals - 11/11/19 1621      PT LONG TERM GOAL #1   Title  Patient will be I with final HEP to maintain progress from PT    Time  8    Period  Weeks    Status  New    Target Date  01/06/20      PT LONG TERM GOAL #2   Title  Patient will exhibit right shoulder AROM WFL or equal to opposite side to improve all  dressing and bathing ability    Time  8    Period  Weeks    Status  New    Target Date  01/06/20      PT LONG TERM GOAL #3   Title  Patient will report ability to perform all working tasks with little or no difficulty    Time  8    Period  Weeks    Status  New  Target Date  01/06/20      PT LONG TERM GOAL #4   Title  Patient will exhibit right shoulder strength grossly >/= 4+/5 MMT to improve ability to lift objects overhead    Time  8    Period  Weeks    Status  New    Target Date  01/06/20             Plan - 11/11/19 1606    Clinical Impression Statement  Patient presents to PT with report of chronic right shoulder pain. The evaluation was limited secondary to high pain level and patient guarding. She does exhibit a limitation with right shoulder active motion in all directions that seems to be more pain limited rather than true stiffness. Unable to assess passive motion and strength due to guarding and pain. Her symptoms are consistent with rotator cuff tendinopathy and impingement due to overuse with lifting. She did report improvement in shoulder pain following e-stim and MHP and was able to tolerate gentle stretching. She would benefit from continued skilled PT to progress her motion and strength to reduce pain and work without limitation.    Personal Factors and Comorbidities  Time since onset of injury/illness/exacerbation;Finances;Past/Current Experience    Examination-Activity Limitations  Reach Overhead;Sleep;Lift;Hygiene/Grooming;Bathing;Dressing;Carry;Caring for Others    Examination-Participation Restrictions  Meal Prep;Cleaning;Community Activity;Shop;Laundry;Yard Work    Merchant [employer] officer  Evolving/Moderate complexity    Clinical Decision Making  Moderate    Rehab Potential  Good    PT Frequency  2x / week    PT Duration  8 weeks    PT Treatment/Interventions  ADLs/Self Care Home Management;Cryotherapy;Electrical Stimulation;Moist  Heat;Neuromuscular re-education;Therapeutic exercise;Therapeutic activities;Iontophoresis 4mg /ml Dexamethasone;Patient/family education;Manual techniques;Dry needling;Passive range of motion;Taping;Joint Manipulations;Spinal Manipulations    PT Next Visit Plan  Assess HEP and progress PRN, manual and modalities PRN for pain, progress shoulder motion as tolerated    PT Home Exercise Plan  [id number]: shoulder blade squeezes, forward table slide flexion AAROM, seated shoulder ER AAROM with dowel, upper trap and levator scap stretch    Consulted and Agree with Plan of Care  Patient       Patient will benefit from skilled therapeutic intervention in order to improve the following deficits and impairments:  Decreased range of motion, Decreased activity tolerance, Pain, Impaired flexibility, Decreased strength, Postural dysfunction  Visit Diagnosis: Chronic right shoulder pain  Muscle weakness (generalized)  Stiffness of right shoulder, not elsewhere classified  Abnormal posture     Problem List Patient Active Problem List   Diagnosis Date Noted  . Sore throat 11/01/2018  . 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 Vitamin D deficiency 12/14/2017  . Murmur, cardiac 11/30/2016  . Anemia 11/30/2016  . Migraine 03/18/2016  . Subclinical hyperthyroidism 12/09/2015  . Chronic hypertension with superimposed preeclampsia 04/09/2011    Hilda Blades, PT, DPT, LAT, ATC 11/11/19  4:35 PM Phone: [phone] Fax: [place] [facility] [address] [place], [place], [address] Phone: [phone]   Fax:  [phone]  Name: Brittany [name] MRN: [id number] Date of Birth: July 07, 1981

## 2019-11-11 NOTE — Patient Instructions (Signed)
Access Code: [email or web address] URL: [email or web address] Date: 11/11/2019 Prepared by: Rosana Hoes  Exercises Seated Scapular Retraction - 2 x daily - 7 x weekly - 10 reps - 3 seconds hold Seated Shoulder Flexion Towel Slide at Table Top - 2 x daily - 7 x weekly - 10 reps - 10 sec hold hold Seated Shoulder External Rotation with Dowel - 2 x daily - 7 x weekly - 10 reps - 10 seconds hold Gentle Upper Trap Stretch - 2 x daily - 7 x weekly - 20 seconds hold Gentle Levator Scapulae Stretch - 2 x daily - 7 x weekly - 20 seconds hold

## 2019-11-21 ENCOUNTER — Other Ambulatory Visit: Payer: Self-pay

## 2019-11-21 ENCOUNTER — Encounter: Payer: Self-pay | Admitting: Emergency Medicine

## 2019-11-21 ENCOUNTER — Ambulatory Visit
Admission: EM | Admit: 2019-11-21 | Discharge: 2019-11-21 | Disposition: A | Discharge status: Home / Self Care | Payer: Medicaid Other | Attending: Physician Assistant | Admitting: Physician Assistant

## 2019-11-21 DIAGNOSIS — R059 Cough, unspecified: Secondary | ICD-10-CM

## 2019-11-21 DIAGNOSIS — R05 Cough: Secondary | ICD-10-CM

## 2019-11-21 DIAGNOSIS — R0981 Nasal congestion: Secondary | ICD-10-CM

## 2019-11-21 MED ORDER — IPRATROPIUM BROMIDE 0.06 % NA SOLN: 2.0000 | Freq: Four times a day (QID) | NASAL | 0 refills | Status: DC

## 2019-11-21 MED ORDER — BENZONATATE 200 MG PO CAPS: 200.0000 mg | ORAL_CAPSULE | Freq: Three times a day (TID) | ORAL | 0 refills | Status: DC

## 2019-11-21 NOTE — ED Provider Notes (Signed)
[name]    CSN: [id number] Arrival date & time: 11/21/19  1409      History   Chief Complaint Chief Complaint  Patient presents with  . Cough  . Headache    HPI Brittany [name] is a 39 y.o. female.   39 year old female comes in for 3 day of URI symptoms. Cough, headache, nasal congestion/drainage, sore throat. Denies fever, chills, body aches. Denies abdominal pain, nausea, vomiting, diarrhea. Denies shortness of breath, loss of taste/smell. Never smoker.      Past Medical History:  Diagnosis Date  . Gallstone   . Headache(784.0)   . Heart murmur   . Hypertension   . Infection    UTI  . Maternal chronic hypertension in first trimester 04/09/2011  . Pregnancy induced hypertension    after 2nd delivery  . Vaginal Pap smear, abnormal    cant remember    Patient Active Problem List   Diagnosis Date Noted  . Sore throat 11/01/2018  . 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 Vitamin D deficiency 12/14/2017  . Murmur, cardiac 11/30/2016  . Anemia 11/30/2016  . Migraine 03/18/2016  . Subclinical hyperthyroidism 12/09/2015  . Chronic hypertension with superimposed preeclampsia 04/09/2011    Past Surgical History:  Procedure Laterality Date  . DILATION AND CURETTAGE OF UTERUS     dont remember year  . LEEP      OB History    Gravida  9   Para  4   Term  2   Preterm  2   AB  5   Living  4     SAB  4   TAB  1   Ectopic  0   Multiple  0   Live Births  4            Home Medications    Prior to Admission medications   Medication Sig Start Date End Date Taking? Authorizing Provider  amLODipine (NORVASC) 10 MG tablet Take 1 tablet (10 mg total) by mouth daily. 11/11/19  Yes Olive Bass, FNP  azelastine (ASTELIN) 0.1 % nasal spray Place 2 sprays into both nostrils 2 (two) times daily. 07/08/19   Cathie Hoops, Brookes Craine V, PA-C  benzonatate (TESSALON) 200 MG capsule Take 1 capsule (200 mg total) by mouth every 8 (eight) hours.  11/21/19   Cathie Hoops, Maleni Seyer V, PA-C  cetirizine (ZYRTEC ALLERGY) 10 MG tablet Take 1 tablet (10 mg total) by mouth daily. 10/01/19   Cathie Hoops, Malakye Nolden V, PA-C  diclofenac Sodium (VOLTAREN) 1 % GEL Apply 4 g topically 4 (four) times daily. 10/22/19   Darr, Veryl Speak, PA-C  etonogestrel (NEXPLANON) 68 MG IMPL implant 1 each by Subdermal route once.    [provider]  ipratropium (ATROVENT) 0.06 % nasal spray Place 2 sprays into both nostrils 4 (four) times daily. 11/21/19   Cathie Hoops, Dotti Busey V, PA-C  Multiple Vitamin (MULTIVITAMIN WITH MINERALS) TABS tablet Take 1 tablet by mouth daily.    [provider]    Family History Family History  Problem Relation Age of Onset  . Asthma Mother   . Hypertension Mother   . CAD Mother   . Asthma Maternal Grandmother   . Hypertension Maternal Grandmother     Social History Social History   Tobacco Use  . Smoking status: Never Smoker  . Smokeless tobacco: Never Used  Substance Use Topics  . Alcohol use: No    Alcohol/week: 0.0 standard drinks  . Drug use: No  Allergies   Patient has no known allergies.   Review of Systems Review of Systems  Reason unable to perform ROS: See HPI as above.     Physical Exam Triage Vital Signs ED Triage Vitals  Enc Vitals Group     BP 11/21/19 1421 130/86     Pulse Rate 11/21/19 1421 86     Resp 11/21/19 1421 16     Temp 11/21/19 1421 98.9 F (37.2 C)     Temp Source 11/21/19 1421 Oral     SpO2 11/21/19 1421 97 %     Weight --      Height --      Head Circumference --      Peak Flow --      Pain Score 11/21/19 1419 5     Pain Loc --      Pain Edu? --      Excl. in [place]? --    No data found.  Updated Vital Signs BP 130/86   Pulse 86   Temp 98.9 F (37.2 C) (Oral)   Resp 16   SpO2 97%   Visual Acuity Right Eye Distance:   Left Eye Distance:   Bilateral Distance:    Right Eye Near:   Left Eye Near:    Bilateral Near:     Physical Exam Constitutional:      General: She is not in acute  distress.    Appearance: Normal appearance. She is not ill-appearing, toxic-appearing or diaphoretic.  HENT:     Head: Normocephalic and atraumatic.     Right Ear: Tympanic membrane, ear canal and external ear normal.     Left Ear: Tympanic membrane, ear canal and external ear normal.     Mouth/Throat:     Mouth: Mucous membranes are moist.     Pharynx: Oropharynx is clear. Uvula midline.  Cardiovascular:     Rate and Rhythm: Normal rate and regular rhythm.     Heart sounds: Normal heart sounds. No murmur. No friction rub. No gallop.   Pulmonary:     Effort: Pulmonary effort is normal. No accessory muscle usage, prolonged expiration, respiratory distress or retractions.     Comments: Lungs clear to auscultation without adventitious lung sounds. Musculoskeletal:     Cervical back: Normal range of motion and neck supple.  Neurological:     General: No focal deficit present.     Mental Status: She is alert and oriented to person, place, and time.      UC Treatments / Results  Labs (all labs ordered are listed, but only abnormal results are displayed) Labs Reviewed  NOVEL CORONAVIRUS, NAA    EKG   Radiology No results found.  Procedures Procedures (including critical care time)  Medications Ordered in UC Medications - No data to display  Initial Impression / Assessment and Plan / UC Course  I have reviewed the triage vital signs and the nursing notes.  Pertinent labs & imaging results that were available during my care of the patient were reviewed by me and considered in my medical decision making (see chart for details).    COVID PCR test ordered. Patient to quarantine until testing results return. No alarming signs on exam.  Patient speaking in full sentences without respiratory distress.  Symptomatic treatment discussed.  Push fluids.  Return precautions given.  Patient expresses understanding and agrees to plan.  Final Clinical Impressions(s) / UC Diagnoses    Final diagnoses:  Cough  Nasal congestion    ED  Prescriptions    Medication Sig Dispense Auth. Provider   ipratropium (ATROVENT) 0.06 % nasal spray Place 2 sprays into both nostrils 4 (four) times daily. 15 mL Stana Bayon V, PA-C   benzonatate (TESSALON) 200 MG capsule Take 1 capsule (200 mg total) by mouth every 8 (eight) hours. 21 capsule Belinda Fisher, PA-C     PDMP not reviewed this encounter.   Belinda Fisher, PA-C 11/21/19 1453

## 2019-11-21 NOTE — Discharge Instructions (Signed)
COVID PCR testing ordered. I would like you to quarantine until testing results.  Tessalon for cough. Start atrovent nasal spray for nasal congestion/drainage. You can use over the counter nasal saline rinse such as neti pot for nasal congestion. Keep hydrated, your urine should be clear to pale yellow in color. Tylenol/motrin for fever and pain. Monitor for any worsening of symptoms, chest pain, shortness of breath, wheezing, swelling of the throat, go to the emergency department for further evaluation needed.

## 2019-11-21 NOTE — ED Triage Notes (Signed)
Cough, headache, sore throat, and chest congestion for 3 days. Would like COVID testing, has not had any vaccinations.

## 2019-11-22 LAB — NOVEL CORONAVIRUS, NAA: SARS-CoV-2, NAA: NOT DETECTED

## 2019-11-22 LAB — SARS-COV-2, NAA 2 DAY TAT

## 2019-11-25 ENCOUNTER — Ambulatory Visit: Payer: Medicaid Other | Admitting: Physical Therapy

## 2019-11-27 ENCOUNTER — Ambulatory Visit: Payer: Self-pay

## 2019-11-27 ENCOUNTER — Other Ambulatory Visit: Payer: Self-pay

## 2019-11-27 ENCOUNTER — Encounter: Payer: Self-pay | Admitting: Sports Medicine

## 2019-11-27 ENCOUNTER — Ambulatory Visit: Payer: Medicaid Other | Admitting: Physical Therapy

## 2019-11-27 ENCOUNTER — Ambulatory Visit: Payer: Medicaid Other | Admitting: Sports Medicine

## 2019-11-27 VITALS — BP 112/80 | Ht 65.0 in | Wt 175.0 lb

## 2019-11-27 DIAGNOSIS — M25511 Pain in right shoulder: Secondary | ICD-10-CM

## 2019-11-27 DIAGNOSIS — G8929 Other chronic pain: Secondary | ICD-10-CM

## 2019-11-27 DIAGNOSIS — S4351XA Sprain of right acromioclavicular joint, initial encounter: Secondary | ICD-10-CM | POA: Diagnosis not present

## 2019-11-27 MED ORDER — METHYLPREDNISOLONE ACETATE 40 MG/ML IJ SUSP
40.0000 mg | Freq: Once | INTRAMUSCULAR | Status: AC
  Administered 2019-11-27: 40 mg via INTRA_ARTICULAR

## 2019-11-27 NOTE — Progress Notes (Addendum)
   [facility] [address] [place], [place] [address] Phone: [phone] Fax: [phone]   Patient Name: Brittany [name] Date of Birth: [birth year] Medical Record Number: [id number] Gender: female Date of Encounter: 11/27/2019  SUBJECTIVE:      Chief Complaint:  Right shoulder pain follow-up   HPI:  Brittany [name] is following up for her right shoulder pain.  I last saw her on 10/23/19 at which time she was given a subacromial CSI and sent to physical therapy. she has noted some improvement in her symptoms since starting physical therapy.  She is having pain on the top of her shoulder and reaching across her body.  She denies any numbness or tingling into her fingertips.   ROS:     See HPI.   PERTINENT  PMH / PSH / FH / SH:  Past Medical, Surgical, Social, and Family History Reviewed & Updated in the EMR.    OBJECTIVE:  BP 112/80   Ht 5\' 5"  (1.651 m)   Wt 175 lb (79.4 kg)   BMI 29.12 kg/m  Physical Exam:  Vital signs are reviewed.   GEN: Alert and oriented, NAD Pulm: Breathing unlabored PSY: normal mood, congruent affect  MSK: Right shoulder Well developed, well nourished, in no acute distress. No swelling, ecchymoses.  No gross deformity. TTP over [facility] joint. Decreased ROM, but improved at last visit. Strength 5/5 with empty can and resisted internal/external rotation. Negative Hawkins, Neers. Negative Yergasons. Negative apprehension. Positive crossarm test NV intact distally.  Procedure After informed consent was obtained, patient was sterilely prepped.  A 1: 1 mixture of 1% lidocaine to Depo-Medrol was injected into her right AC joint under ultrasound guidance.  The patient tolerated the procedure.  All post procedure questions were answered.  ASSESSMENT & PLAN:   1. Right shoulder pain  There was an effusion at the [facility] joint, so I am hopeful this injection will give her some relief.  She is to continue physical therapy.  She will  follow-up with me in 1 month at which time if there is no improvement we can consider MRI.   [facility], DO, ATC Sports Medicine Fellow  Addendum:  I was the preceptor for this visit and available for immediate consultation.  Judge Stall MD [facility] Blizzard

## 2019-12-04 ENCOUNTER — Ambulatory Visit: Payer: Medicaid Other | Attending: Sports Medicine | Admitting: Physical Therapy

## 2019-12-04 ENCOUNTER — Telehealth: Payer: Self-pay | Admitting: Physical Therapy

## 2019-12-04 NOTE — Telephone Encounter (Signed)
Patient contacted due to missed appointment, left voicemail instructing patient on attendance policy and that this was her last scheduled appointment so she would need to call the office to schedule any further visits.

## 2020-01-15 ENCOUNTER — Encounter: Payer: Self-pay | Admitting: Sports Medicine

## 2020-01-15 ENCOUNTER — Encounter (HOSPITAL_COMMUNITY): Payer: Self-pay

## 2020-01-15 ENCOUNTER — Ambulatory Visit: Payer: Medicaid Other | Admitting: Sports Medicine

## 2020-01-15 ENCOUNTER — Other Ambulatory Visit: Payer: Self-pay

## 2020-01-15 ENCOUNTER — Ambulatory Visit (HOSPITAL_COMMUNITY)
Admission: EM | Admit: 2020-01-15 | Discharge: 2020-01-15 | Disposition: A | Discharge status: Home / Self Care | Payer: Medicaid Other

## 2020-01-15 VITALS — BP 122/84 | Ht 65.0 in

## 2020-01-15 DIAGNOSIS — R519 Headache, unspecified: Secondary | ICD-10-CM

## 2020-01-15 DIAGNOSIS — G8929 Other chronic pain: Secondary | ICD-10-CM | POA: Diagnosis not present

## 2020-01-15 DIAGNOSIS — M25511 Pain in right shoulder: Secondary | ICD-10-CM

## 2020-01-15 DIAGNOSIS — J069 Acute upper respiratory infection, unspecified: Secondary | ICD-10-CM

## 2020-01-15 MED ORDER — IBUPROFEN 800 MG PO TABS: 800.0000 mg | ORAL_TABLET | Freq: Three times a day (TID) | ORAL | 0 refills | Status: DC

## 2020-01-15 MED ORDER — FLUTICASONE PROPIONATE 50 MCG/ACT NA SUSP: 2.0000 | Freq: Every day | NASAL | 0 refills | Status: DC

## 2020-01-15 NOTE — ED Provider Notes (Signed)
[facility]    CSN: [id number] Arrival date & time: 01/15/20  9892      History   Chief Complaint Chief Complaint  Patient presents with  . Nasal Congestion  . Headache    HPI Brittany [name] is a 39 y.o. female. who presents with HA on her forehead for the past 3 days. Has been having rhinitis, ST, chest congestion and cough. Denies fever Has been taking Tylenol which has not helped her HA. Denies vision changes or allergy symptoms. Has not been sick at all with URI or allergy symptoms in the past month. Denies being exposed to anyone sick. Denies body aches, loss of taste or smell. She took a dose of Benadryl last night and her HA is worse today.     Past Medical History:  Diagnosis Date  . Gallstone   . Headache(784.0)   . Heart murmur   . Hypertension   . Infection    UTI  . Maternal chronic hypertension in first trimester 04/09/2011  . Pregnancy induced hypertension    after 2nd delivery  . Vaginal Pap smear, abnormal    cant remember    Patient Active Problem List   Diagnosis Date Noted  . Sore throat 11/01/2018  . 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 Vitamin D deficiency 12/14/2017  . Murmur, cardiac 11/30/2016  . Anemia 11/30/2016  . Migraine 03/18/2016  . Subclinical hyperthyroidism 12/09/2015  . Chronic hypertension with superimposed preeclampsia 04/09/2011    Past Surgical History:  Procedure Laterality Date  . DILATION AND CURETTAGE OF UTERUS     dont remember year  . LEEP      OB History    Gravida  9   Para  4   Term  2   Preterm  2   AB  5   Living  4     SAB  4   TAB  1   Ectopic  0   Multiple  0   Live Births  4            Home Medications    Prior to Admission medications   Medication Sig Start Date End Date Taking? Authorizing Provider  acetaminophen (TYLENOL) 500 MG tablet Take 500 mg by mouth every 6 (six) hours as needed.   Yes [provider]  amLODipine (NORVASC) 10 MG  tablet Take 1 tablet (10 mg total) by mouth daily. 11/11/19   Olive Bass, FNP  azelastine (ASTELIN) 0.1 % nasal spray Place 2 sprays into both nostrils 2 (two) times daily. 07/08/19   Cathie Hoops, Amy V, PA-C  diclofenac Sodium (VOLTAREN) 1 % GEL Apply 4 g topically 4 (four) times daily. 10/22/19   Darr, Veryl Speak, PA-C  etonogestrel (NEXPLANON) 68 MG IMPL implant 1 each by Subdermal route once.    [provider]  fluticasone (FLONASE) 50 MCG/ACT nasal spray Place 2 sprays into both nostrils daily for 7 days. 01/15/20 01/22/20  Rodriguez-Southworth, Nettie Elm, PA-C  ibuprofen (ADVIL) 800 MG tablet Take 1 tablet (800 mg total) by mouth 3 (three) times daily. For headache and sinus pain 01/15/20   Rodriguez-Southworth, Nettie Elm, PA-C  Multiple Vitamin (MULTIVITAMIN WITH MINERALS) TABS tablet Take 1 tablet by mouth daily.    [provider]  cetirizine (ZYRTEC ALLERGY) 10 MG tablet Take 1 tablet (10 mg total) by mouth daily. 10/01/19 01/15/20  Cathie Hoops, Amy V, PA-C  ipratropium (ATROVENT) 0.06 % nasal spray Place 2 sprays into both nostrils 4 (four) times daily. 11/21/19  01/15/20  Belinda Fisher, PA-C    Family History Family History  Problem Relation Age of Onset  . Asthma Mother   . Hypertension Mother   . CAD Mother   . Asthma Maternal Grandmother   . Hypertension Maternal Grandmother     Social History Social History   Tobacco Use  . Smoking status: Never Smoker  . Smokeless tobacco: Never Used  Vaping Use  . Vaping Use: Never used  Substance Use Topics  . Alcohol use: No    Alcohol/week: 0.0 standard drinks  . Drug use: No     Allergies   Patient has no known allergies.   Review of Systems Review of Systems  Constitutional: Positive for fatigue. Negative for activity change, appetite change, chills, diaphoresis and fever.  HENT: Positive for congestion, postnasal drip, rhinorrhea, sinus pressure and sore throat. Negative for ear discharge, ear pain, sneezing and trouble  swallowing.   Eyes: Negative for discharge and visual disturbance.  Respiratory: Positive for cough. Negative for chest tightness, shortness of breath and wheezing.   Cardiovascular: Negative for chest pain.  Gastrointestinal: Negative for nausea.  Genitourinary:       She is nursing  Musculoskeletal: Negative for arthralgias, gait problem, myalgias, neck pain and neck stiffness.  Skin: Negative for rash.  Neurological: Positive for headaches. Negative for dizziness, weakness and light-headedness.  Hematological: Negative for adenopathy.  Psychiatric/Behavioral: Negative for confusion.     Physical Exam Triage Vital Signs ED Triage Vitals  Enc Vitals Group     BP 01/15/20 0923 132/83     Pulse Rate 01/15/20 0923 92     Resp 01/15/20 0923 18     Temp 01/15/20 0923 98.8 F (37.1 C)     Temp Source 01/15/20 0923 Oral     SpO2 01/15/20 0923 98 %     Weight --      Height --      Head Circumference --      Peak Flow --      Pain Score 01/15/20 0921 8     Pain Loc --      Pain Edu? --      Excl. in [place]? --    No data found.  Updated Vital Signs BP 132/83 (BP Location: Left Arm)   Pulse 92   Temp 98.8 F (37.1 C) (Oral)   Resp 18   SpO2 98%   Visual Acuity Right Eye Distance:   Left Eye Distance:   Bilateral Distance:    Right Eye Near:   Left Eye Near:    Bilateral Near:     Physical Exam Vitals and nursing note reviewed.  Constitutional:      General: She is not in acute distress.    Appearance: She is obese. She is not toxic-appearing.  HENT:     Head: Atraumatic.     Nose: Mucosal edema present.     Right Turbinates: Swollen.     Left Turbinates: Swollen.     Right Sinus: Frontal sinus tenderness present.     Left Sinus: Frontal sinus tenderness present.     Comments: Nose mucosa is pale pink    Mouth/Throat:     Mouth: Mucous membranes are moist.  Eyes:     General: No scleral icterus.    Extraocular Movements: Extraocular movements intact.      Pupils: Pupils are equal, round, and reactive to light.  Cardiovascular:     Rate and Rhythm: Normal rate and regular rhythm.  Heart sounds: No murmur heard.   Pulmonary:     Effort: Pulmonary effort is normal.     Breath sounds: Normal breath sounds.  Musculoskeletal:     Cervical back: Neck supple. No rigidity.  Lymphadenopathy:     Cervical: No cervical adenopathy.  Skin:    General: Skin is warm and dry.  Neurological:     Mental Status: She is alert.     Cranial Nerves: No cranial nerve deficit or facial asymmetry.     Motor: No weakness.     Coordination: Romberg sign negative. Coordination normal.     Gait: Gait normal.     Deep Tendon Reflexes: Reflexes normal.  Psychiatric:        Mood and Affect: Mood normal.        Speech: Speech normal.        Behavior: Behavior normal.      UC Treatments / Results  Labs (all labs ordered are listed, but only abnormal results are displayed) Labs Reviewed - No data to display  EKG   Radiology No results found.  Procedures Procedures (including critical care time)  Medications Ordered in UC Medications - No data to display  Initial Impression / Assessment and Plan / UC Course  I have reviewed the triage vital signs and the nursing notes. She was advised to avoid antihistamines. I placed her on Flonase and Ibuprofen as noted.  Final Clinical Impressions(s) / UC Diagnoses   Final diagnoses:  Viral upper respiratory tract infection  Sinus headache     Discharge Instructions     Follow up with your family Dr as needed specially if you dont get better in the next 7 days.     ED Prescriptions    Medication Sig Dispense Auth. Provider   ibuprofen (ADVIL) 800 MG tablet Take 1 tablet (800 mg total) by mouth 3 (three) times daily. For headache and sinus pain 21 tablet Rodriguez-Southworth, Amauria Younts, PA-C   fluticasone (FLONASE) 50 MCG/ACT nasal spray Place 2 sprays into both nostrils daily for 7 days. 18.2 g  Rodriguez-Southworth, Nettie Elm, PA-C     PDMP not reviewed this encounter.   Garey Ham, PA-C 01/15/20 1001

## 2020-01-15 NOTE — ED Triage Notes (Signed)
Pt presents with headache, nasal congestion and cough x 3-4 days.Tylenol not gives any relieve.

## 2020-01-15 NOTE — Discharge Instructions (Signed)
Follow up with your family Dr as needed specially if you dont get better in the next 7 days.

## 2020-01-15 NOTE — Progress Notes (Addendum)
   [facility] [address] [place], [place] [address] Phone: [phone] Fax: [phone]   Patient Name: Brittany [name] Date of Birth: Jan 03, 1981 Medical Record Number: [id number] Gender: female Date of Encounter: 01/15/2020  SUBJECTIVE:      Chief Complaint:  Right shoulder follow-up   HPI:  Brittany [name] returns today for right shoulder pain.  She states AC joint injection helped for multiple weeks, but the pain is returned.  She has not found physical therapy to be beneficial.  She is using Tylenol as needed.  Denies any numbness or tingling down her arm. She had a subacromial bursa injection in mid April and an [facility] joint injection in mid May.     ROS:     See HPI.   PERTINENT  PMH / PSH / FH / SH:  Past Medical, Surgical, Social, and Family History Reviewed & Updated in the EMR.    OBJECTIVE:  BP 122/84   Ht 5\' 5"  (1.651 m)   BMI 29.12 kg/m  Physical Exam:  Vital signs are reviewed.   GEN: Alert and oriented, NAD Pulm: Breathing unlabored PSY: normal mood, congruent affect  MSK: Right shoulder Well developed, well nourished, in no acute distress. No swelling, ecchymoses.  No gross deformity. TTP over [facility] joint. Decreased ROM, but improved at last visit. Strength 4/5 with empty can and resisted internal/external rotation. Negative Hawkins, Neers. Negative Yergasons. Negative apprehension. Positive crossarm test NV intact distally.  ASSESSMENT & PLAN:   1. Chronic right shoulder pain  Given that we have injected both her subacromial bursa and AC joint, and the patient continues to have pain, we will move forward with an MRI to evaluate for possible rotator cuff pathology and better evaluation of the [facility] joint.  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   [facility], DO, ATC Sports Medicine Fellow  Addendum:  I was the preceptor for this  visit and available for immediate consultation.  Judge Stall MD [facility]

## 2020-01-24 ENCOUNTER — Other Ambulatory Visit: Payer: Self-pay

## 2020-01-24 ENCOUNTER — Ambulatory Visit
Admission: EM | Admit: 2020-01-24 | Discharge: 2020-01-24 | Disposition: A | Discharge status: Home / Self Care | Payer: Medicaid Other | Attending: Physician Assistant | Admitting: Physician Assistant

## 2020-01-24 ENCOUNTER — Encounter: Payer: Self-pay | Admitting: Family

## 2020-01-24 ENCOUNTER — Encounter: Payer: Medicaid Other | Admitting: Family

## 2020-01-24 DIAGNOSIS — J029 Acute pharyngitis, unspecified: Secondary | ICD-10-CM | POA: Diagnosis not present

## 2020-01-24 DIAGNOSIS — R0981 Nasal congestion: Secondary | ICD-10-CM

## 2020-01-24 DIAGNOSIS — R109 Unspecified abdominal pain: Secondary | ICD-10-CM

## 2020-01-24 DIAGNOSIS — R059 Cough, unspecified: Secondary | ICD-10-CM

## 2020-01-24 LAB — POCT URINALYSIS DIP (MANUAL ENTRY)
Bilirubin, UA: NEGATIVE
Glucose, UA: NEGATIVE mg/dL
Ketones, POC UA: NEGATIVE mg/dL
Leukocytes, UA: NEGATIVE
Nitrite, UA: NEGATIVE
Protein Ur, POC: NEGATIVE mg/dL
Spec Grav, UA: 1.025 (ref 1.010–1.025)
Urobilinogen, UA: 0.2 E.U./dL
pH, UA: 5.5 (ref 5.0–8.0)

## 2020-01-24 MED ORDER — PREDNISONE 50 MG PO TABS: 50.0000 mg | ORAL_TABLET | Freq: Every day | ORAL | 0 refills | Status: DC

## 2020-01-24 MED ORDER — KETOROLAC TROMETHAMINE 15 MG/ML IJ SOLN
15.0000 mg | Freq: Once | INTRAMUSCULAR | Status: AC
  Administered 2020-01-24: 15 mg via INTRAMUSCULAR

## 2020-01-24 NOTE — Discharge Instructions (Signed)
As discussed, blood in urine indicates possible kidney stone causing symptoms. However, with your exam more consistent with muscle pain. Start prednisone, this will help with muscle inflammation as well as cough/sinus pressure. Toradol injection in office today for possible kidney stone. Continue ibuprofen/tylenol as needed for pain. Follow up with PCP for further evaluation if symptoms not improving. If having sudden severe pain, with nausea/vomiting, unable to urinate, go to the emergency department for further evaluation.

## 2020-01-24 NOTE — ED Provider Notes (Signed)
[name]    CSN: [id number] Arrival date & time: 01/24/20  1524      History   Chief Complaint Chief Complaint  Patient presents with   Nasal Congestion   Flank Pain    HPI Brittany [name] is a 39 y.o. female.   39 year old female comes in for 1 week history of URI symptoms. Nasal congestion, cough, chest congestion, sinus pressure. Denies fever, chills, body aches. Denies abdominal pain, nausea, vomiting, diarrhea. Denies shortness of breath, loss of taste/smell. Using flonase without relief.  3 day history of bilateral flank pain. Movement causes worsening pain. Denies urinary symptoms. States pain occasionally radiates to the groin.      Past Medical History:  Diagnosis Date   Gallstone    Headache(784.0)    Heart murmur    Hypertension    Infection    UTI   Maternal chronic hypertension in first trimester 04/09/2011   Pregnancy induced hypertension    after 2nd delivery   Vaginal Pap smear, abnormal    cant remember    Patient Active Problem List   Diagnosis Date Noted   Sore throat 11/01/2018   Depression affecting pregnancy 03/06/2018   Vitamin D deficiency 12/14/2017   Murmur, cardiac 11/30/2016   Anemia 11/30/2016   Migraine 03/18/2016   Subclinical hyperthyroidism 12/09/2015   Chronic hypertension with superimposed preeclampsia 04/09/2011    Past Surgical History:  Procedure Laterality Date   DILATION AND CURETTAGE OF UTERUS     dont remember year   LEEP      OB History    Gravida  9   Para  4   Term  2   Preterm  2   AB  5   Living  4     SAB  4   TAB  1   Ectopic  0   Multiple  0   Live Births  4            Home Medications    Prior to Admission medications   Medication Sig Start Date End Date Taking? Authorizing Provider  acetaminophen (TYLENOL) 500 MG tablet Take 500 mg by mouth every 6 (six) hours as needed.    [provider]  amLODipine (NORVASC) 10 MG tablet  Take 1 tablet (10 mg total) by mouth daily. 11/11/19   Olive Bass, FNP  azelastine (ASTELIN) 0.1 % nasal spray Place 2 sprays into both nostrils 2 (two) times daily. 07/08/19   Cathie Hoops, Kinnick Maus V, PA-C  diclofenac Sodium (VOLTAREN) 1 % GEL Apply 4 g topically 4 (four) times daily. 10/22/19   Darr, Veryl Speak, PA-C  etonogestrel (NEXPLANON) 68 MG IMPL implant 1 each by Subdermal route once.    [provider]  fluticasone (FLONASE) 50 MCG/ACT nasal spray Place 2 sprays into both nostrils daily for 7 days. 01/15/20 01/22/20  Rodriguez-Southworth, Nettie Elm, PA-C  ibuprofen (ADVIL) 800 MG tablet Take 1 tablet (800 mg total) by mouth 3 (three) times daily. For headache and sinus pain 01/15/20   Rodriguez-Southworth, Nettie Elm, PA-C  Multiple Vitamin (MULTIVITAMIN WITH MINERALS) TABS tablet Take 1 tablet by mouth daily.    [provider]  predniSONE (DELTASONE) 50 MG tablet Take 1 tablet (50 mg total) by mouth daily with breakfast. 01/24/20   Cathie Hoops, Arietta Eisenstein V, PA-C  cetirizine (ZYRTEC ALLERGY) 10 MG tablet Take 1 tablet (10 mg total) by mouth daily. 10/01/19 01/15/20  Cathie Hoops, Herlinda Heady V, PA-C  ipratropium (ATROVENT) 0.06 % nasal spray Place  2 sprays into both nostrils 4 (four) times daily. 11/21/19 01/15/20  Belinda Fisher, PA-C    Family History Family History  Problem Relation Age of Onset   Asthma Mother    Hypertension Mother    CAD Mother    Asthma Maternal Grandmother    Hypertension Maternal Grandmother     Social History Social History   Tobacco Use   Smoking status: Never Smoker   Smokeless tobacco: Never Used  Building services engineer Use: Never used  Substance Use Topics   Alcohol use: No    Alcohol/week: 0.0 standard drinks   Drug use: No     Allergies   Patient has no known allergies.   Review of Systems Review of Systems  Reason unable to perform ROS: See HPI as above.     Physical Exam Triage Vital Signs ED Triage Vitals  Enc Vitals Group     BP 01/24/20 1529 (!) 163/111       Pulse Rate 01/24/20 1529 96     Resp 01/24/20 1529 16     Temp 01/24/20 1529 98.2 F (36.8 C)     Temp src --      SpO2 01/24/20 1529 97 %     Weight --      Height --      Head Circumference --      Peak Flow --      Pain Score 01/24/20 1533 10     Pain Loc --      Pain Edu? --      Excl. in GC? --    No data found.  Updated Vital Signs BP (!) 163/111    Pulse 96    Temp 98.2 F (36.8 C)    Resp 16    SpO2 97%   Physical Exam Constitutional:      General: She is not in acute distress.    Appearance: Normal appearance. She is not ill-appearing, toxic-appearing or diaphoretic.  HENT:     Head: Normocephalic and atraumatic.     Nose:     Right Sinus: Maxillary sinus tenderness present. No frontal sinus tenderness.     Left Sinus: Maxillary sinus tenderness present. No frontal sinus tenderness.     Mouth/Throat:     Mouth: Mucous membranes are moist.     Pharynx: Oropharynx is clear. Uvula midline.  Cardiovascular:     Rate and Rhythm: Normal rate and regular rhythm.     Heart sounds: Normal heart sounds. No murmur heard.  No friction rub. No gallop.   Pulmonary:     Effort: Pulmonary effort is normal. No accessory muscle usage, prolonged expiration, respiratory distress or retractions.     Comments: Lungs clear to auscultation without adventitious lung sounds. Musculoskeletal:     Cervical back: Normal range of motion and neck supple.     Comments: No tenderness to palpation of spinous processes. Tenderness to palpation of right low thoracic back. Full ROM of back. Negative CVA tenderness.   Neurological:     General: No focal deficit present.     Mental Status: She is alert and oriented to person, place, and time.      UC Treatments / Results  Labs (all labs ordered are listed, but only abnormal results are displayed) Labs Reviewed  POCT URINALYSIS DIP (MANUAL ENTRY) - Abnormal; Notable for the following components:      Result Value   Blood, UA large (*)     All other components  within normal limits  NOVEL CORONAVIRUS, NAA    EKG   Radiology No results found.  Procedures Procedures (including critical care time)  Medications Ordered in UC Medications  ketorolac (TORADOL) 15 MG/ML injection 15 mg (has no administration in time range)    Initial Impression / Assessment and Plan / UC Course  I have reviewed the triage vital signs and the nursing notes.  Pertinent labs & imaging results that were available during my care of the patient were reviewed by me and considered in my medical decision making (see chart for details).    LCTAB, no fever, shob, low suspicion for pneumonia. Patient with large blood in urine, no urinary symptoms, no CVA tenderness, no history of kidney stones. Flank pain is reproducible by palpation. Discussed possible kidney stones, though likely MSK pain as well. Patient currently breast feeding 27 month old. Will provide toradol injection for now for both MSK pain and possible kidney stones. Prednisone for MSK pain, cough. And continue other symptomatic treatment. Push fluids. Return precautions given. Otherwise to follow up with PCP for further evaluation if symptoms not improving.    Final Clinical Impressions(s) / UC Diagnoses   Final diagnoses:  Sore throat  Nasal congestion  Cough  Flank pain    ED Prescriptions    Medication Sig Dispense Auth. Provider   predniSONE (DELTASONE) 50 MG tablet Take 1 tablet (50 mg total) by mouth daily with breakfast. 5 tablet Belinda Fisher, PA-C     PDMP not reviewed this encounter.   Belinda Fisher, PA-C 01/24/20 1605

## 2020-01-24 NOTE — ED Triage Notes (Signed)
Pt c/o bilateral flank pain, nasal congestion and body aches x 3 days

## 2020-01-25 LAB — SARS-COV-2, NAA 2 DAY TAT

## 2020-01-25 LAB — NOVEL CORONAVIRUS, NAA: SARS-CoV-2, NAA: NOT DETECTED

## 2020-02-13 ENCOUNTER — Other Ambulatory Visit: Payer: Medicaid Other

## 2020-02-18 ENCOUNTER — Ambulatory Visit
Admission: RE | Admit: 2020-02-18 | Discharge: 2020-02-18 | Disposition: A | Discharge status: Home / Self Care | Payer: Medicaid Other | Source: Ambulatory Visit | Attending: Sports Medicine | Admitting: Sports Medicine

## 2020-02-18 DIAGNOSIS — G8929 Other chronic pain: Secondary | ICD-10-CM

## 2020-02-22 ENCOUNTER — Other Ambulatory Visit: Payer: Self-pay

## 2020-02-22 ENCOUNTER — Ambulatory Visit
Admission: EM | Admit: 2020-02-22 | Discharge: 2020-02-22 | Disposition: A | Discharge status: Home / Self Care | Payer: Medicaid Other | Attending: Physician Assistant | Admitting: Physician Assistant

## 2020-02-22 ENCOUNTER — Encounter: Payer: Self-pay | Admitting: Emergency Medicine

## 2020-02-22 DIAGNOSIS — R059 Cough, unspecified: Secondary | ICD-10-CM

## 2020-02-22 DIAGNOSIS — R05 Cough: Secondary | ICD-10-CM

## 2020-02-22 DIAGNOSIS — Z1152 Encounter for screening for COVID-19: Secondary | ICD-10-CM

## 2020-02-22 DIAGNOSIS — R0981 Nasal congestion: Secondary | ICD-10-CM

## 2020-02-22 MED ORDER — CETIRIZINE-PSEUDOEPHEDRINE ER 5-120 MG PO TB12: 1.0000 | ORAL_TABLET | Freq: Every day | ORAL | 0 refills | Status: DC

## 2020-02-22 MED ORDER — BENZONATATE 200 MG PO CAPS
200.0000 mg | ORAL_CAPSULE | Freq: Three times a day (TID) | ORAL | 0 refills | Status: DC
Start: 2020-02-22 — End: 2020-04-14

## 2020-02-22 NOTE — Discharge Instructions (Addendum)
COVID PCR testing ordered. I would like you to quarantine until testing results. Tessalon for cough. Zyrtec-D for congestion/drainage.  Tylenol/motrin for pain and fever. Keep hydrated, urine should be clear to pale yellow in color. If experiencing shortness of breath, trouble breathing, go to the emergency department for further evaluation needed.

## 2020-02-22 NOTE — ED Provider Notes (Signed)
[name]    CSN: [id number] Arrival date & time: 02/22/20  1031      History   Chief Complaint Chief Complaint  Patient presents with   Cough   Generalized Body Aches    HPI Brittany [name] is a 39 y.o. female.   39 year old female comes in for 3 day of URI symptoms. Has had cough, nasal congestion, body aches. Denies fever, chills.. Denies abdominal pain, nausea, vomiting, diarrhea. Denies shortness of breath, loss of taste/smell.      Past Medical History:  Diagnosis Date   Gallstone    Headache(784.0)    Heart murmur    Hypertension    Infection    UTI   Maternal chronic hypertension in first trimester 04/09/2011   Pregnancy induced hypertension    after 2nd delivery   Vaginal Pap smear, abnormal    cant remember    Patient Active Problem List   Diagnosis Date Noted   Sore throat 11/01/2018   Depression affecting pregnancy 03/06/2018   Vitamin D deficiency 12/14/2017   Murmur, cardiac 11/30/2016   Anemia 11/30/2016   Migraine 03/18/2016   Subclinical hyperthyroidism 12/09/2015   Chronic hypertension with superimposed preeclampsia 04/09/2011    Past Surgical History:  Procedure Laterality Date   DILATION AND CURETTAGE OF UTERUS     dont remember year   LEEP      OB History    Gravida  9   Para  4   Term  2   Preterm  2   AB  5   Living  4     SAB  4   TAB  1   Ectopic  0   Multiple  0   Live Births  4            Home Medications    Prior to Admission medications   Medication Sig Start Date End Date Taking? Authorizing Provider  acetaminophen (TYLENOL) 500 MG tablet Take 500 mg by mouth every 6 (six) hours as needed.    [provider]  amLODipine (NORVASC) 10 MG tablet Take 1 tablet (10 mg total) by mouth daily. 11/11/19   Olive Bass, FNP  azelastine (ASTELIN) 0.1 % nasal spray Place 2 sprays into both nostrils 2 (two) times daily. 07/08/19   Cathie Hoops, Cherika Jessie V, PA-C    benzonatate (TESSALON) 200 MG capsule Take 1 capsule (200 mg total) by mouth every 8 (eight) hours. 02/22/20   Cathie Hoops, Sajad Glander V, PA-C  cetirizine-pseudoephedrine (ZYRTEC-D) 5-120 MG tablet Take 1 tablet by mouth daily. 02/22/20   Cathie Hoops, Milan Perkins V, PA-C  diclofenac Sodium (VOLTAREN) 1 % GEL Apply 4 g topically 4 (four) times daily. 10/22/19   Darr, Veryl Speak, PA-C  etonogestrel (NEXPLANON) 68 MG IMPL implant 1 each by Subdermal route once.    [provider]  fluticasone (FLONASE) 50 MCG/ACT nasal spray Place 2 sprays into both nostrils daily for 7 days. 01/15/20 01/22/20  Rodriguez-Southworth, Nettie Elm, PA-C  ibuprofen (ADVIL) 800 MG tablet Take 1 tablet (800 mg total) by mouth 3 (three) times daily. For headache and sinus pain 01/15/20   Rodriguez-Southworth, Nettie Elm, PA-C  Multiple Vitamin (MULTIVITAMIN WITH MINERALS) TABS tablet Take 1 tablet by mouth daily.    [provider]  cetirizine (ZYRTEC ALLERGY) 10 MG tablet Take 1 tablet (10 mg total) by mouth daily. 10/01/19 01/15/20  Cathie Hoops, Ezrah Dembeck V, PA-C  ipratropium (ATROVENT) 0.06 % nasal spray Place 2 sprays into both nostrils 4 (four) times  daily. 11/21/19 01/15/20  Belinda Fisher, PA-C    Family History Family History  Problem Relation Age of Onset   Asthma Mother    Hypertension Mother    CAD Mother    Asthma Maternal Grandmother    Hypertension Maternal Grandmother     Social History Social History   Tobacco Use   Smoking status: Never Smoker   Smokeless tobacco: Never Used  Building services engineer Use: Never used  Substance Use Topics   Alcohol use: No    Alcohol/week: 0.0 standard drinks   Drug use: No     Allergies   Patient has no known allergies.   Review of Systems Review of Systems  Reason unable to perform ROS: See HPI as above.     Physical Exam Triage Vital Signs ED Triage Vitals  Enc Vitals Group     BP 02/22/20 1043 (!) 167/92     Pulse Rate 02/22/20 1043 91     Resp 02/22/20 1043 18     Temp 02/22/20 1043 98.2 F  (36.8 C)     Temp Source 02/22/20 1043 Oral     SpO2 02/22/20 1043 95 %     Weight --      Height --      Head Circumference --      Peak Flow --      Pain Score 02/22/20 1044 7     Pain Loc --      Pain Edu? --      Excl. in GC? --    No data found.  Updated Vital Signs BP (!) 167/92 (BP Location: Right Arm)    Pulse 91    Temp 98.2 F (36.8 C) (Oral)    Resp 18    SpO2 95%   Physical Exam Constitutional:      General: She is not in acute distress.    Appearance: Normal appearance. She is not ill-appearing, toxic-appearing or diaphoretic.  HENT:     Head: Normocephalic and atraumatic.     Mouth/Throat:     Mouth: Mucous membranes are moist.     Pharynx: Oropharynx is clear. Uvula midline.  Cardiovascular:     Rate and Rhythm: Normal rate and regular rhythm.     Heart sounds: Normal heart sounds. No murmur heard.  No friction rub. No gallop.   Pulmonary:     Effort: Pulmonary effort is normal. No accessory muscle usage, prolonged expiration, respiratory distress or retractions.     Comments: Lungs clear to auscultation without adventitious lung sounds. Musculoskeletal:     Cervical back: Normal range of motion and neck supple.  Skin:    General: Skin is warm and dry.  Neurological:     General: No focal deficit present.     Mental Status: She is alert and oriented to person, place, and time.      UC Treatments / Results  Labs (all labs ordered are listed, but only abnormal results are displayed) Labs Reviewed  NOVEL CORONAVIRUS, NAA    EKG   Radiology No results found.  Procedures Procedures (including critical care time)  Medications Ordered in UC Medications - No data to display  Initial Impression / Assessment and Plan / UC Course  I have reviewed the triage vital signs and the nursing notes.  Pertinent labs & imaging results that were available during my care of the patient were reviewed by me and considered in my medical decision making (see  chart for details).  COVID PCR test ordered. Patient to quarantine until testing results return. No alarming signs on exam.  LCTAB. Symptomatic treatment discussed.  Push fluids.  Return precautions given.  Patient expresses understanding and agrees to plan.  Final Clinical Impressions(s) / UC Diagnoses   Final diagnoses:  Encounter for screening for COVID-19  Cough  Nasal congestion    ED Prescriptions    Medication Sig Dispense Auth. Provider   benzonatate (TESSALON) 200 MG capsule Take 1 capsule (200 mg total) by mouth every 8 (eight) hours. 21 capsule Ilda Laskin V, PA-C   cetirizine-pseudoephedrine (ZYRTEC-D) 5-120 MG tablet Take 1 tablet by mouth daily. 20 tablet Belinda Fisher, PA-C     PDMP not reviewed this encounter.   Belinda Fisher, PA-C 02/22/20 1151

## 2020-02-22 NOTE — ED Triage Notes (Signed)
Pt here for cough, congestion and body aches x 3 days; pt sts hasnt taken her htn meds in 4 days

## 2020-02-24 LAB — SARS-COV-2, NAA 2 DAY TAT

## 2020-02-24 LAB — NOVEL CORONAVIRUS, NAA: SARS-CoV-2, NAA: NOT DETECTED

## 2020-03-09 ENCOUNTER — Encounter: Payer: Self-pay | Admitting: Family

## 2020-03-11 ENCOUNTER — Ambulatory Visit (INDEPENDENT_AMBULATORY_CARE_PROVIDER_SITE_OTHER): Payer: Medicaid Other | Admitting: *Deleted

## 2020-03-11 ENCOUNTER — Other Ambulatory Visit: Payer: Self-pay

## 2020-03-11 DIAGNOSIS — Z23 Encounter for immunization: Secondary | ICD-10-CM | POA: Diagnosis not present

## 2020-03-16 ENCOUNTER — Ambulatory Visit (INDEPENDENT_AMBULATORY_CARE_PROVIDER_SITE_OTHER): Payer: Medicaid Other

## 2020-03-16 ENCOUNTER — Ambulatory Visit
Admission: EM | Admit: 2020-03-16 | Discharge: 2020-03-16 | Disposition: A | Discharge status: Home / Self Care | Payer: Medicaid Other | Attending: Emergency Medicine | Admitting: Emergency Medicine

## 2020-03-16 DIAGNOSIS — S93401A Sprain of unspecified ligament of right ankle, initial encounter: Secondary | ICD-10-CM

## 2020-03-16 MED ORDER — IBUPROFEN 800 MG PO TABS
800.0000 mg | ORAL_TABLET | Freq: Three times a day (TID) | ORAL | 0 refills | Status: DC
Start: 2020-03-16 — End: 2020-04-14

## 2020-03-16 NOTE — Discharge Instructions (Addendum)
Use anti-inflammatories for pain/swelling. You may take up to 800 mg Ibuprofen every 8 hours with food. You may supplement Ibuprofen with Tylenol [phone] mg every 8 hours.  Crutches ASO ankle brace Ice and elevate Follow up if not improving

## 2020-03-16 NOTE — ED Provider Notes (Signed)
[name]    CSN: [id number] Arrival date & time: 03/16/20  1428      History   Chief Complaint Chief Complaint  Patient presents with  . Ankle Pain    HPI Brittany [name] is a 39 y.o. female presenting today for evaluation of ankle injury.  Patient reports last night she was running and rolled her right ankle.  Since she has had significant pain especially with weightbearing and swelling to her ankle.  Denies any prior fracture.  HPI  Past Medical History:  Diagnosis Date  . Gallstone   . Headache(784.0)   . Heart murmur   . Hypertension   . Infection    UTI  . Maternal chronic hypertension in first trimester 04/09/2011  . Pregnancy induced hypertension    after 2nd delivery  . Vaginal Pap smear, abnormal    cant remember    Patient Active Problem List   Diagnosis Date Noted  . Sore throat 11/01/2018  . 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 Vitamin D deficiency 12/14/2017  . Murmur, cardiac 11/30/2016  . Anemia 11/30/2016  . Migraine 03/18/2016  . Subclinical hyperthyroidism 12/09/2015  . Chronic hypertension with superimposed preeclampsia 04/09/2011    Past Surgical History:  Procedure Laterality Date  . DILATION AND CURETTAGE OF UTERUS     dont remember year  . LEEP      OB History    Gravida  9   Para  4   Term  2   Preterm  2   AB  5   Living  4     SAB  4   TAB  1   Ectopic  0   Multiple  0   Live Births  4            Home Medications    Prior to Admission medications   Medication Sig Start Date End Date Taking? Authorizing Provider  acetaminophen (TYLENOL) 500 MG tablet Take 500 mg by mouth every 6 (six) hours as needed.    [provider]  amLODipine (NORVASC) 10 MG tablet Take 1 tablet (10 mg total) by mouth daily. 11/11/19   Olive Bass, FNP  azelastine (ASTELIN) 0.1 % nasal spray Place 2 sprays into both nostrils 2 (two) times daily. 07/08/19   Cathie Hoops, Amy V, PA-C  benzonatate  (TESSALON) 200 MG capsule Take 1 capsule (200 mg total) by mouth every 8 (eight) hours. 02/22/20   Cathie Hoops, Amy V, PA-C  cetirizine-pseudoephedrine (ZYRTEC-D) 5-120 MG tablet Take 1 tablet by mouth daily. 02/22/20   Cathie Hoops, Amy V, PA-C  diclofenac Sodium (VOLTAREN) 1 % GEL Apply 4 g topically 4 (four) times daily. 10/22/19   Darr, Veryl Speak, PA-C  etonogestrel (NEXPLANON) 68 MG IMPL implant 1 each by Subdermal route once.    [provider]  fluticasone (FLONASE) 50 MCG/ACT nasal spray Place 2 sprays into both nostrils daily for 7 days. 01/15/20 01/22/20  Rodriguez-Southworth, Nettie Elm, PA-C  ibuprofen (ADVIL) 800 MG tablet Take 1 tablet (800 mg total) by mouth 3 (three) times daily. 03/16/20   Andrena Margerum C, PA-C  Multiple Vitamin (MULTIVITAMIN WITH MINERALS) TABS tablet Take 1 tablet by mouth daily.    [provider]  cetirizine (ZYRTEC ALLERGY) 10 MG tablet Take 1 tablet (10 mg total) by mouth daily. 10/01/19 01/15/20  Cathie Hoops, Amy V, PA-C  ipratropium (ATROVENT) 0.06 % nasal spray Place 2 sprays into both nostrils 4 (four) times daily. 11/21/19 01/15/20  Belinda Fisher, PA-C  Family History Family History  Problem Relation Age of Onset  . Asthma Mother   . Hypertension Mother   . CAD Mother   . Asthma Maternal Grandmother   . Hypertension Maternal Grandmother     Social History Social History   Tobacco Use  . Smoking status: Never Smoker  . Smokeless tobacco: Never Used  Vaping Use  . Vaping Use: Never used  Substance Use Topics  . Alcohol use: No    Alcohol/week: 0.0 standard drinks  . Drug use: No     Allergies   Patient has no known allergies.   Review of Systems Review of Systems  Constitutional: Negative for fatigue and fever.  Eyes: Negative for visual disturbance.  Respiratory: Negative for shortness of breath.   Cardiovascular: Negative for chest pain.  Gastrointestinal: Negative for abdominal pain, nausea and vomiting.  Musculoskeletal: Positive for arthralgias, gait  problem and joint swelling.  Skin: Negative for color change, rash and wound.  Neurological: Negative for dizziness, weakness, light-headedness and headaches.     Physical Exam Triage Vital Signs ED Triage Vitals  Enc Vitals Group     BP 03/16/20 1633 (!) 144/103     Pulse Rate 03/16/20 1633 82     Resp 03/16/20 1633 18     Temp 03/16/20 1633 99 F (37.2 C)     Temp Source 03/16/20 1633 Oral     SpO2 03/16/20 1633 97 %     Weight --      Height --      Head Circumference --      Peak Flow --      Pain Score 03/16/20 1639 10     Pain Loc --      Pain Edu? --      Excl. in GC? --    No data found.  Updated Vital Signs BP (!) 144/103 (BP Location: Left Arm)   Pulse 82   Temp 99 F (37.2 C) (Oral)   Resp 18   SpO2 97%   Breastfeeding Yes   Visual Acuity Right Eye Distance:   Left Eye Distance:   Bilateral Distance:    Right Eye Near:   Left Eye Near:    Bilateral Near:     Physical Exam Vitals and nursing note reviewed.  Constitutional:      Appearance: She is well-developed.     Comments: No acute distress  HENT:     Head: Normocephalic and atraumatic.     Ears:     Comments:      Nose: Nose normal.  Eyes:     Conjunctiva/sclera: Conjunctivae normal.  Cardiovascular:     Rate and Rhythm: Normal rate.  Pulmonary:     Effort: Pulmonary effort is normal. No respiratory distress.  Abdominal:     General: There is no distension.  Musculoskeletal:        General: Normal range of motion.     Cervical back: Neck supple.     Comments: Right ankle: Swelling noted to lateral malleolus, tender to palpation over this area extending anteriorly, nontender to medial malleolus, along Achilles or throughout dorsum of foot, dorsalis pedis 2+  Skin:    General: Skin is warm and dry.  Neurological:     Mental Status: She is alert and oriented to person, place, and time.      UC Treatments / Results  Labs (all labs ordered are listed, but only abnormal results  are displayed) Labs Reviewed - No data to display  EKG   Radiology DG Ankle Complete Right  Result Date: 03/16/2020 CLINICAL DATA:  Twisted ankle EXAM: RIGHT ANKLE - COMPLETE 3+ VIEW COMPARISON:  None. FINDINGS: There is no evidence of fracture, dislocation, or joint effusion. There is no evidence of arthropathy or other focal bone abnormality. Soft tissue swelling laterally. IMPRESSION: Negative. Electronically Signed   By: Jasmine Pang M.D.   On: 03/16/2020 17:02    Procedures Procedures (including critical care time)  Medications Ordered in UC Medications - No data to display  Initial Impression / Assessment and Plan / UC Course  I have reviewed the triage vital signs and the nursing notes.  Pertinent labs & imaging results that were available during my care of the patient were reviewed by me and considered in my medical decision making (see chart for details).     X-ray negative for fracture, suspect likely sprain.  Weight-bear as tolerated, ice elevation and anti-inflammatories.  Provided crutches and ASO ankle brace prior to discharge for support.  Discussed strict return precautions. Patient verbalized understanding and is agreeable with plan.  Final Clinical Impressions(s) / UC Diagnoses   Final diagnoses:  Sprain of right ankle, unspecified ligament, initial encounter     Discharge Instructions     Use anti-inflammatories for pain/swelling. You may take up to 800 mg Ibuprofen every 8 hours with food. You may supplement Ibuprofen with Tylenol [phone] mg every 8 hours.  Crutches ASO ankle brace Ice and elevate Follow up if not improving    ED Prescriptions    Medication Sig Dispense Auth. Provider   ibuprofen (ADVIL) 800 MG tablet Take 1 tablet (800 mg total) by mouth 3 (three) times daily. 30 tablet Demetrios Byron, [place] C, PA-C     PDMP not reviewed this encounter.   Lew Dawes, [place] 03/17/20 [phone]

## 2020-03-16 NOTE — ED Triage Notes (Signed)
Pt states running her 39yr old and rolled her rt ankle.

## 2020-04-13 ENCOUNTER — Emergency Department (HOSPITAL_COMMUNITY)
Admission: EM | Admit: 2020-04-13 | Discharge: 2020-04-14 | Disposition: A | Discharge status: Home / Self Care | Payer: Medicaid Other | Attending: Emergency Medicine | Admitting: Emergency Medicine

## 2020-04-13 ENCOUNTER — Emergency Department (HOSPITAL_COMMUNITY): Payer: Medicaid Other

## 2020-04-13 ENCOUNTER — Encounter (HOSPITAL_COMMUNITY): Payer: Self-pay | Admitting: Emergency Medicine

## 2020-04-13 ENCOUNTER — Other Ambulatory Visit: Payer: Self-pay

## 2020-04-13 DIAGNOSIS — J029 Acute pharyngitis, unspecified: Secondary | ICD-10-CM | POA: Insufficient documentation

## 2020-04-13 DIAGNOSIS — Z20822 Contact with and (suspected) exposure to covid-19: Secondary | ICD-10-CM | POA: Diagnosis not present

## 2020-04-13 DIAGNOSIS — R072 Precordial pain: Secondary | ICD-10-CM | POA: Diagnosis present

## 2020-04-13 LAB — BASIC METABOLIC PANEL
Anion gap: 11 (ref 5–15)
BUN: 20 mg/dL (ref 6–20)
CO2: 24 mmol/L (ref 22–32)
Calcium: 9.4 mg/dL (ref 8.9–10.3)
Chloride: 102 mmol/L (ref 98–111)
Creatinine, Ser: 0.97 mg/dL (ref 0.44–1.00)
GFR calc Af Amer: >60 mL/min (ref >60)
GFR calc non Af Amer: >60 mL/min (ref >60)
Glucose, Bld: 93 mg/dL (ref 70–99)
Potassium: 3.9 mmol/L (ref 3.5–5.1)
Sodium: 137 mmol/L (ref 135–145)

## 2020-04-13 LAB — RESPIRATORY PANEL BY RT PCR (FLU A&B, COVID)
Influenza A by PCR: NEGATIVE
Influenza B by PCR: NEGATIVE
SARS Coronavirus 2 by RT PCR: NEGATIVE

## 2020-04-13 LAB — CBC
HCT: 40.5 % (ref 36.0–46.0)
Hemoglobin: 12.8 g/dL (ref 12.0–15.0)
MCH: 27.2 pg (ref 26.0–34.0)
MCHC: 31.6 g/dL (ref 30.0–36.0)
MCV: 86 fL (ref 80.0–100.0)
Platelets: 270 10*3/uL (ref 150–400)
RBC: 4.71 MIL/uL (ref 3.87–5.11)
RDW: 13.2 % (ref 11.5–15.5)
WBC: 12.4 10*3/uL — ABNORMAL HIGH (ref 4.0–10.5)
nRBC: 0 % (ref 0.0–0.2)

## 2020-04-13 LAB — TROPONIN I (HIGH SENSITIVITY): Troponin I (High Sensitivity): <2 ng/L (ref <18)

## 2020-04-13 LAB — GROUP A STREP BY PCR: Group A Strep by PCR: NOT DETECTED

## 2020-04-13 NOTE — ED Notes (Signed)
Pt said she had to go home because she had to breast feed her child. Pt said the wait was to long. Pt was encourged to stay.

## 2020-04-13 NOTE — ED Triage Notes (Addendum)
Pt reports substernal chest pain, fever, sore throat and headache starting today

## 2020-04-14 ENCOUNTER — Ambulatory Visit
Admission: RE | Admit: 2020-04-14 | Discharge: 2020-04-14 | Disposition: A | Discharge status: Home / Self Care | Payer: Medicaid Other | Source: Ambulatory Visit | Attending: Physician Assistant | Admitting: Physician Assistant

## 2020-04-14 ENCOUNTER — Ambulatory Visit (INDEPENDENT_AMBULATORY_CARE_PROVIDER_SITE_OTHER): Payer: Medicaid Other

## 2020-04-14 ENCOUNTER — Encounter: Payer: Self-pay | Admitting: Family

## 2020-04-14 VITALS — BP 165/82 | HR 90 | Temp 98.7°F | Resp 18

## 2020-04-14 DIAGNOSIS — R52 Pain, unspecified: Secondary | ICD-10-CM

## 2020-04-14 DIAGNOSIS — R079 Chest pain, unspecified: Secondary | ICD-10-CM

## 2020-04-14 DIAGNOSIS — R509 Fever, unspecified: Secondary | ICD-10-CM | POA: Diagnosis not present

## 2020-04-14 DIAGNOSIS — J029 Acute pharyngitis, unspecified: Secondary | ICD-10-CM | POA: Diagnosis not present

## 2020-04-14 NOTE — ED Triage Notes (Signed)
Pt c/o fever, headache, chest pain, sore throat and body aches since yesterday. States went to ED last night and had blood work and chest x-ray. States her PCP suggested through her my chart that she may have PNA and needs to be treated.

## 2020-04-14 NOTE — ED Provider Notes (Signed)
[name]    CSN: [id number] Arrival date & time: 04/14/20  1448      History   Chief Complaint Chief Complaint  Patient presents with   appt 3- fever    HPI Brittany [name] is a 39 y.o. female.   39 year old female comes in for 2-day history of URI symptoms.  Has had headache, chest pain, sore throat, body aches.  T-max 100.1, responsive to antipyretics.  Denies cough. Central chest pain that is constant, no aggravating or alleviating factor.  Denies abdominal pain, nausea, vomiting.  Denies shortness of breath.     Past Medical History:  Diagnosis Date   Gallstone    Headache(784.0)    Heart murmur    Hypertension    Infection    UTI   Maternal chronic hypertension in first trimester 04/09/2011   Pregnancy induced hypertension    after 2nd delivery   Vaginal Pap smear, abnormal    cant remember    Patient Active Problem List   Diagnosis Date Noted   Sore throat 11/01/2018   Depression affecting pregnancy 03/06/2018   Vitamin D deficiency 12/14/2017   Murmur, cardiac 11/30/2016   Anemia 11/30/2016   Migraine 03/18/2016   Subclinical hyperthyroidism 12/09/2015   Chronic hypertension with superimposed preeclampsia 04/09/2011    Past Surgical History:  Procedure Laterality Date   DILATION AND CURETTAGE OF UTERUS     dont remember year   LEEP      OB History    Gravida  9   Para  4   Term  2   Preterm  2   AB  5   Living  4     SAB  4   TAB  1   Ectopic  0   Multiple  0   Live Births  4            Home Medications    Prior to Admission medications   Medication Sig Start Date End Date Taking? Authorizing Provider  acetaminophen (TYLENOL) 500 MG tablet Take 500 mg by mouth every 6 (six) hours as needed.    [provider]  amLODipine (NORVASC) 10 MG tablet Take 1 tablet (10 mg total) by mouth daily. 11/11/19   Olive Bass, FNP  cetirizine-pseudoephedrine (ZYRTEC-D) 5-120 MG  tablet Take 1 tablet by mouth daily. 02/22/20   Belinda Fisher, PA-C  etonogestrel (NEXPLANON) 68 MG IMPL implant 1 each by Subdermal route once.    [provider]  Multiple Vitamin (MULTIVITAMIN WITH MINERALS) TABS tablet Take 1 tablet by mouth daily.    [provider]  cetirizine (ZYRTEC ALLERGY) 10 MG tablet Take 1 tablet (10 mg total) by mouth daily. 10/01/19 01/15/20  Cathie Hoops, Ione [place] V, PA-C  ipratropium (ATROVENT) 0.06 % nasal spray Place 2 sprays into both nostrils 4 (four) times daily. 11/21/19 01/15/20  Belinda Fisher, PA-C    Family History Family History  Problem Relation Age of Onset   Asthma Mother    Hypertension Mother    CAD Mother    Asthma Maternal Grandmother    Hypertension Maternal Grandmother     Social History Social History   Tobacco Use   Smoking status: Never Smoker   Smokeless tobacco: Never Used  Building services engineer Use: Never used  Substance Use Topics   Alcohol use: No    Alcohol/week: 0.0 standard drinks   Drug use: No     Allergies   Patient has no known  allergies.   Review of Systems Review of Systems  Reason unable to perform ROS: See HPI as above.     Physical Exam Triage Vital Signs ED Triage Vitals  Enc Vitals Group     BP 04/14/20 1524 (!) 165/82     Pulse Rate 04/14/20 1524 90     Resp 04/14/20 1524 18     Temp 04/14/20 1524 98.7 F (37.1 C)     Temp Source 04/14/20 1524 Oral     SpO2 04/14/20 1524 97 %     Weight --      Height --      Head Circumference --      Peak Flow --      Pain Score 04/14/20 1557 8     Pain Loc --      Pain Edu? --      Excl. in GC? --    No data found.  Updated Vital Signs BP (!) 165/82 (BP Location: Right Arm)    Pulse 90    Temp 98.7 F (37.1 C) (Oral)    Resp 18    SpO2 97%    Breastfeeding Yes   Physical Exam Constitutional:      General: She is not in acute distress.    Appearance: Normal appearance. She is not ill-appearing, toxic-appearing or diaphoretic.  HENT:      Head: Normocephalic and atraumatic.     Mouth/Throat:     Mouth: Mucous membranes are moist.     Pharynx: Oropharynx is clear. Uvula midline.  Cardiovascular:     Rate and Rhythm: Normal rate and regular rhythm.     Heart sounds: Normal heart sounds. No murmur heard.  No friction rub. No gallop.   Pulmonary:     Effort: Pulmonary effort is normal. No accessory muscle usage, prolonged expiration, respiratory distress or retractions.     Comments: Lungs clear to auscultation without adventitious lung sounds. Chest:     Chest wall: Tenderness present.  Musculoskeletal:     Cervical back: Normal range of motion and neck supple.  Neurological:     General: No focal deficit present.     Mental Status: She is alert and oriented to person, place, and time.      UC Treatments / Results  Labs (all labs ordered are listed, but only abnormal results are displayed) Labs Reviewed  NOVEL CORONAVIRUS, NAA    EKG   Radiology DG Chest 2 View  Result Date: 04/14/2020 CLINICAL DATA:  Fever and chest pain. EXAM: CHEST - 2 VIEW COMPARISON:  Chest radiograph yesterday. FINDINGS: Improved lung volumes from prior exam with improved streaky bibasilar opacities. There is no new airspace disease or confluent consolidation. Normal heart size and mediastinal contours. No pleural effusion, pneumothorax, or pulmonary edema. No acute osseous abnormalities are seen. IMPRESSION: Improved lung volumes since yesterday with improved streaky bibasilar opacities, favoring atelectasis. No acute findings. Electronically Signed   By: Narda Rutherford M.D.   On: 04/14/2020 16:54   DG Chest Portable 1 View  Result Date: 04/13/2020 CLINICAL DATA:  Substernal chest pain, fever, sore throat headache which began today EXAM: PORTABLE CHEST 1 VIEW COMPARISON:  Radiograph 02/24/2015 FINDINGS: Streaky and patchy opacities are present in the lung bases possibly reflecting airspace disease or atelectasis with vascular crowding. No  pneumothorax or visible effusion. The cardiomediastinal contours are unremarkable. No acute osseous or soft tissue abnormality. IMPRESSION: Streaky and patchy opacities in the lung bases, could reflect atelectasis or developing infection/airspace disease. Electronically  Signed   By: Kreg Shropshire M.D.   On: 04/13/2020 22:27    Procedures Procedures (including critical care time)  Medications Ordered in UC Medications - No data to display  Initial Impression / Assessment and Plan / UC Course  I have reviewed the triage vital signs and the nursing notes.  Pertinent labs & imaging results that were available during my care of the patient were reviewed by me and considered in my medical decision making (see chart for details).    Was at the ED yesterday, with workup done, but LWBS after triage.  CBC with slightly elevated WBC.  BMP unremarkable.  Negative flu, Covid, strep.  Had 1 view chest x-ray concerning for atelectasis versus developing infection.  Chest x-ray 2 views ordered with improved streaky bibasilar opacity, favoring atelectasis.  This corresponds to current history and exam given 2-day history of symptoms.  She is afebrile, nontoxic.  Discussed symptomatic treatment for now.  Push fluids.  Return precautions given.  Final Clinical Impressions(s) / UC Diagnoses   Final diagnoses:  Sore throat  Body aches   ED Prescriptions    None     PDMP not reviewed this encounter.   Belinda Fisher, PA-C 04/14/20 [phone]

## 2020-04-14 NOTE — Discharge Instructions (Addendum)
Chest xray showed no pneumonia, but with atelectasis. Your COVID test yesterday was negative. Tylenol/motrin for pain and fever. Keep hydrated, urine should be clear to pale yellow in color. If experiencing shortness of breath, trouble breathing, go to the emergency department for further evaluation needed.

## 2020-04-28 ENCOUNTER — Encounter: Payer: Self-pay | Admitting: Family

## 2020-06-17 ENCOUNTER — Encounter: Payer: Self-pay | Admitting: Family

## 2020-06-17 ENCOUNTER — Ambulatory Visit
Admission: EM | Admit: 2020-06-17 | Discharge: 2020-06-17 | Disposition: A | Discharge status: Home / Self Care | Payer: Medicaid Other | Attending: Emergency Medicine | Admitting: Emergency Medicine

## 2020-06-17 ENCOUNTER — Encounter: Payer: Self-pay | Admitting: Emergency Medicine

## 2020-06-17 DIAGNOSIS — J069 Acute upper respiratory infection, unspecified: Secondary | ICD-10-CM | POA: Diagnosis not present

## 2020-06-17 MED ORDER — IBUPROFEN 600 MG PO TABS
600.0000 mg | ORAL_TABLET | Freq: Four times a day (QID) | ORAL | 0 refills | Status: DC | PRN
Start: 2020-06-17 — End: 2020-08-09

## 2020-06-17 MED ORDER — DM-GUAIFENESIN ER 30-600 MG PO TB12: 1.0000 | ORAL_TABLET | Freq: Two times a day (BID) | ORAL | 0 refills | Status: DC

## 2020-06-17 MED ORDER — BENZONATATE 200 MG PO CAPS: 200.0000 mg | ORAL_CAPSULE | Freq: Three times a day (TID) | ORAL | 0 refills | Status: AC | PRN

## 2020-06-17 NOTE — Discharge Instructions (Signed)
Covid test pending, monitor my chart for results Tylenol and ibuprofen for fever headaches body aches, sore throat Tessalon/benzonatate every 8 hours for cough Supplement with Mucinex DM May use other over-the-counter medicine as needed for cough and congestion Rest and fluids Follow-up if not improving or worsening

## 2020-06-17 NOTE — ED Triage Notes (Signed)
Cough, headache, body aches x 2 days.

## 2020-06-17 NOTE — ED Provider Notes (Signed)
[name]    CSN: [id number] Arrival date & time: 06/17/20  0946      History   Chief Complaint Chief Complaint  Patient presents with  . Cough    HPI Brittany [name] is a 39 y.o. female history of hypertension, presenting today for evaluation of URI symptoms.  Reports over the past 2 days has had cough congestion sore throat chills body aches and headache.  Has had some slight chest discomfort.  Denies any shortness of breath.  Denies GI symptoms.  Denies close sick contacts.  Using over-the-counter medicines without relief.  HPI  Past Medical History:  Diagnosis Date  . Gallstone   . Headache(784.0)   . Heart murmur   . Hypertension   . Infection    UTI  . Maternal chronic hypertension in first trimester 04/09/2011  . Pregnancy induced hypertension    after 2nd delivery  . Vaginal Pap smear, abnormal    cant remember    Patient Active Problem List   Diagnosis Date Noted  . Sore throat 11/01/2018  . 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 Vitamin D deficiency 12/14/2017  . Murmur, cardiac 11/30/2016  . Anemia 11/30/2016  . Migraine 03/18/2016  . Subclinical hyperthyroidism 12/09/2015  . Chronic hypertension with superimposed preeclampsia 04/09/2011    Past Surgical History:  Procedure Laterality Date  . DILATION AND CURETTAGE OF UTERUS     dont remember year  . LEEP      OB History    Gravida  9   [name]  4   Term  2   Preterm  2   AB  5   Living  4     SAB  4   TAB  1   Ectopic  0   Multiple  0   Live Births  4            Home Medications    Prior to Admission medications   Medication Sig Start Date End Date Taking? Authorizing Provider  acetaminophen (TYLENOL) 500 MG tablet Take 500 mg by mouth every 6 (six) hours as needed.    [provider]  amLODipine (NORVASC) 10 MG tablet Take 1 tablet (10 mg total) by mouth daily. 11/11/19   Olive Bass, FNP  benzonatate (TESSALON) 200 MG capsule  Take 1 capsule (200 mg total) by mouth 3 (three) times daily as needed for up to 7 days for cough. 06/17/20 06/24/20  Arabella Revelle C, PA-C  cetirizine-pseudoephedrine (ZYRTEC-D) 5-120 MG tablet Take 1 tablet by mouth daily. 02/22/20   Belinda Fisher, PA-C  dextromethorphan-guaiFENesin (MUCINEX DM) 30-600 MG 12hr tablet Take 1 tablet by mouth 2 (two) times daily. 06/17/20   Timya Trimmer C, PA-C  etonogestrel (NEXPLANON) 68 MG IMPL implant 1 each by Subdermal route once.    [provider]  ibuprofen (ADVIL) 600 MG tablet Take 1 tablet (600 mg total) by mouth every 6 (six) hours as needed. 06/17/20   Eon Zunker C, PA-C  Multiple Vitamin (MULTIVITAMIN WITH MINERALS) TABS tablet Take 1 tablet by mouth daily.    [provider]  cetirizine (ZYRTEC ALLERGY) 10 MG tablet Take 1 tablet (10 mg total) by mouth daily. 10/01/19 01/15/20  Cathie Hoops, Amy V, PA-C  ipratropium (ATROVENT) 0.06 % nasal spray Place 2 sprays into both nostrils 4 (four) times daily. 11/21/19 01/15/20  Belinda Fisher, PA-C    Family History Family History  Problem Relation Age of Onset  . Asthma Mother   .  Hypertension Mother   . CAD Mother   . Asthma Maternal Grandmother   . Hypertension Maternal Grandmother     Social History Social History   Tobacco Use  . Smoking status: Never Smoker  . Smokeless tobacco: Never Used  Vaping Use  . Vaping Use: Never used  Substance Use Topics  . Alcohol use: No    Alcohol/week: 0.0 standard drinks  . Drug use: No     Allergies   Patient has no known allergies.   Review of Systems Review of Systems  Constitutional: Positive for fatigue. Negative for activity change, appetite change, chills and fever.  HENT: Positive for congestion, rhinorrhea and sore throat. Negative for ear pain, sinus pressure and trouble swallowing.   Eyes: Negative for discharge and redness.  Respiratory: Positive for cough and chest tightness. Negative for shortness of breath.   Cardiovascular:  Negative for chest pain.  Gastrointestinal: Negative for abdominal pain, diarrhea, nausea and vomiting.  Musculoskeletal: Negative for myalgias.  Skin: Negative for rash.  Neurological: Negative for dizziness, light-headedness and headaches.     Physical Exam Triage Vital Signs ED Triage Vitals  Enc Vitals Group     BP 06/17/20 1147 137/79     Pulse Rate 06/17/20 1147 83     Resp 06/17/20 1147 16     Temp 06/17/20 1147 98.4 F (36.9 C)     Temp Source 06/17/20 1147 Oral     SpO2 06/17/20 1147 96 %     Weight --      Height --      Head Circumference --      Peak Flow --      Pain Score 06/17/20 1146 6     Pain Loc --      Pain Edu? --      Excl. in GC? --    No data found.  Updated Vital Signs BP 137/79 (BP Location: Left Arm)   Pulse 83   Temp 98.4 F (36.9 C) (Oral)   Resp 16   SpO2 96%   Visual Acuity Right Eye Distance:   Left Eye Distance:   Bilateral Distance:    Right Eye Near:   Left Eye Near:    Bilateral Near:     Physical Exam Vitals and nursing note reviewed.  Constitutional:      Appearance: She is well-developed.     Comments: No acute distress  HENT:     Head: Normocephalic and atraumatic.     Ears:     Comments: Bilateral ears without tenderness to palpation of external auricle, tragus and mastoid, EAC's without erythema or swelling, TM's with good bony landmarks and cone of light. Non erythematous.     Nose: Nose normal.     Mouth/Throat:     Comments: Oral mucosa pink and moist, no tonsillar enlargement or exudate. Posterior pharynx patent and nonerythematous, no uvula deviation or swelling. Normal phonation. Eyes:     Conjunctiva/sclera: Conjunctivae normal.  Cardiovascular:     Rate and Rhythm: Normal rate and regular rhythm.  Pulmonary:     Effort: Pulmonary effort is normal. No respiratory distress.     Comments: Breathing comfortably at rest, CTABL, no wheezing, rales or other adventitious sounds auscultated Abdominal:      General: There is no distension.  Musculoskeletal:        General: Normal range of motion.     Cervical back: Neck supple.  Skin:    General: Skin is warm and dry.  Neurological:  Mental Status: She is alert and oriented to person, place, and time.      UC Treatments / Results  Labs (all labs ordered are listed, but only abnormal results are displayed) Labs Reviewed  NOVEL CORONAVIRUS, NAA    EKG   Radiology No results found.  Procedures Procedures (including critical care time)  Medications Ordered in UC Medications - No data to display  Initial Impression / Assessment and Plan / UC Course  I have reviewed the triage vital signs and the nursing notes.  Pertinent labs & imaging results that were available during my care of the patient were reviewed by me and considered in my medical decision making (see chart for details).     Viral URI with cough-Covid test pending, exam reassuring, recommending symptomatic and supportive care rest and fluids.  Continue to monitor.  Discussed strict return precautions. Patient verbalized understanding and is agreeable with plan.  Final Clinical Impressions(s) / UC Diagnoses   Final diagnoses:  Viral URI with cough     Discharge Instructions     Covid test pending, monitor my chart for results Tylenol and ibuprofen for fever headaches body aches, sore throat Tessalon/benzonatate every 8 hours for cough Supplement with Mucinex DM May use other over-the-counter medicine as needed for cough and congestion Rest and fluids Follow-up if not improving or worsening     ED Prescriptions    Medication Sig Dispense Auth. Provider   ibuprofen (ADVIL) 600 MG tablet Take 1 tablet (600 mg total) by mouth every 6 (six) hours as needed. 30 tablet Joshoa Shawler C, PA-C   dextromethorphan-guaiFENesin (MUCINEX DM) 30-600 MG 12hr tablet Take 1 tablet by mouth 2 (two) times daily. 20 tablet Zorion Nims C, PA-C   benzonatate  (TESSALON) 200 MG capsule Take 1 capsule (200 mg total) by mouth 3 (three) times daily as needed for up to 7 days for cough. 28 capsule Jermell Holeman, [place] C, PA-C     PDMP not reviewed this encounter.   Lew Dawes, PA-C 06/17/20 1256

## 2020-06-18 LAB — SARS-COV-2, NAA 2 DAY TAT

## 2020-06-18 LAB — NOVEL CORONAVIRUS, NAA: SARS-CoV-2, NAA: NOT DETECTED

## 2020-07-12 ENCOUNTER — Encounter (HOSPITAL_COMMUNITY): Payer: Self-pay | Admitting: Emergency Medicine

## 2020-07-12 ENCOUNTER — Other Ambulatory Visit: Payer: Self-pay

## 2020-07-12 ENCOUNTER — Ambulatory Visit (HOSPITAL_COMMUNITY)
Admission: EM | Admit: 2020-07-12 | Discharge: 2020-07-12 | Disposition: A | Discharge status: Home / Self Care | Payer: Medicaid Other

## 2020-07-12 DIAGNOSIS — S90212A Contusion of left great toe with damage to nail, initial encounter: Secondary | ICD-10-CM

## 2020-07-12 DIAGNOSIS — L601 Onycholysis: Secondary | ICD-10-CM

## 2020-07-12 NOTE — ED Triage Notes (Signed)
Pt c/o of left big toe pain x 2 days. She reports running toe over with shopping cart.

## 2020-07-12 NOTE — Discharge Instructions (Addendum)
Apply ice to toe Keep toe elevated Take ibuprofen as needed for pain.

## 2020-07-12 NOTE — ED Provider Notes (Signed)
[facility]    CSN: [id number] Arrival date & time: 07/12/20  1340      History   Chief Complaint Chief Complaint  Patient presents with  . Toe Pain    HPI Brittany [name] is a 39 y.o. female.   Patient here c/w L great toe pain x 2 days ago.  She was at [employer], empty shopping cart ran over great toe.  She notes toenail detached from toe today.  Admits some pain, denies ecchymosis, swelling.     Past Medical History:  Diagnosis Date  . Gallstone   . Headache(784.0)   . Heart murmur   . Hypertension   . Infection    UTI  . Maternal chronic hypertension in first trimester 04/09/2011  . Pregnancy induced hypertension    after 2nd delivery  . Vaginal Pap smear, abnormal    cant remember    Patient Active Problem List   Diagnosis Date Noted  . Sore throat 11/01/2018  . 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 Vitamin D deficiency 12/14/2017  . Murmur, cardiac 11/30/2016  . Anemia 11/30/2016  . Migraine 03/18/2016  . Subclinical hyperthyroidism 12/09/2015  . Chronic hypertension with superimposed preeclampsia 04/09/2011    Past Surgical History:  Procedure Laterality Date  . DILATION AND CURETTAGE OF UTERUS     dont remember year  . LEEP      OB History    Gravida  9   Para  4   Term  2   Preterm  2   AB  5   Living  4     SAB  4   IAB  1   Ectopic  0   Multiple  0   Live Births  4            Home Medications    Prior to Admission medications   Medication Sig Start Date End Date Taking? Authorizing Provider  acetaminophen (TYLENOL) 500 MG tablet Take 500 mg by mouth every 6 (six) hours as needed.   Yes [provider]  amLODipine (NORVASC) 10 MG tablet Take 1 tablet (10 mg total) by mouth daily. 11/11/19  Yes Olive Bass, FNP  cetirizine-pseudoephedrine (ZYRTEC-D) 5-120 MG tablet Take 1 tablet by mouth daily. 02/22/20  Yes Yu, Amy V, PA-C  Multiple Vitamin (MULTIVITAMIN WITH MINERALS) TABS  tablet Take 1 tablet by mouth daily.   Yes [provider]  dextromethorphan-guaiFENesin (MUCINEX DM) 30-600 MG 12hr tablet Take 1 tablet by mouth 2 (two) times daily. 06/17/20   Wieters, Hallie C, PA-C  etonogestrel (NEXPLANON) 68 MG IMPL implant 1 each by Subdermal route once.    [provider]  ibuprofen (ADVIL) 600 MG tablet Take 1 tablet (600 mg total) by mouth every 6 (six) hours as needed. 06/17/20   Wieters, Hallie C, PA-C  cetirizine (ZYRTEC ALLERGY) 10 MG tablet Take 1 tablet (10 mg total) by mouth daily. 10/01/19 01/15/20  Cathie Hoops, Amy V, PA-C  ipratropium (ATROVENT) 0.06 % nasal spray Place 2 sprays into both nostrils 4 (four) times daily. 11/21/19 01/15/20  Belinda Fisher, PA-C    Family History Family History  Problem Relation Age of Onset  . Asthma Mother   . Hypertension Mother   . CAD Mother   . Asthma Maternal Grandmother   . Hypertension Maternal Grandmother     Social History Social History   Tobacco Use  . Smoking status: Never Smoker  . Smokeless tobacco: Never Used  Vaping Use  .  Vaping Use: Never used  Substance Use Topics  . Alcohol use: No    Alcohol/week: 0.0 standard drinks  . Drug use: No     Allergies   Patient has no known allergies.   Review of Systems Review of Systems  Constitutional: Negative for chills, fatigue and fever.  Musculoskeletal: Positive for myalgias. Negative for arthralgias, gait problem and joint swelling.  Skin: Negative for color change and wound.  Hematological: Negative for adenopathy. Does not bruise/bleed easily.  Psychiatric/Behavioral: Negative for sleep disturbance.     Physical Exam Triage Vital Signs ED Triage Vitals  Enc Vitals Group     BP 07/12/20 1436 138/87     Pulse Rate 07/12/20 1436 83     Resp 07/12/20 1436 16     Temp 07/12/20 1436 98.1 F (36.7 C)     Temp Source 07/12/20 1436 Oral     SpO2 07/12/20 1436 98 %     Weight --      Height --      Head Circumference --      Peak Flow --       Pain Score 07/12/20 1433 7     Pain Loc --      Pain Edu? --      Excl. in GC? --    No data found.  Updated Vital Signs BP 138/87 (BP Location: Left Arm)   Pulse 83   Temp 98.1 F (36.7 C) (Oral)   Resp 16   SpO2 98%   Visual Acuity Right Eye Distance:   Left Eye Distance:   Bilateral Distance:    Right Eye Near:   Left Eye Near:    Bilateral Near:     Physical Exam Vitals and nursing note reviewed.  Constitutional:      General: She is not in acute distress.    Appearance: Normal appearance. She is not ill-appearing.  HENT:     Head: Normocephalic and atraumatic.  Eyes:     General: No scleral icterus.    Extraocular Movements: Extraocular movements intact.     Conjunctiva/sclera: Conjunctivae normal.  Pulmonary:     Effort: Pulmonary effort is normal. No respiratory distress.  Musculoskeletal:     Cervical back: Normal range of motion. No rigidity.     Left foot: Normal range of motion. Tenderness (distal tip L great toe) present. No swelling, deformity, laceration or bony tenderness.     Comments: Distal 1/2 of L great toenail separated, bottommost layer still attached to nailbed  Skin:    Capillary Refill: Capillary refill takes less than 2 seconds.     Coloration: Skin is not jaundiced.     Findings: No rash.  Neurological:     General: No focal deficit present.     Mental Status: She is alert and oriented to person, place, and time.     Motor: No weakness.     Gait: Gait normal.  Psychiatric:        Mood and Affect: Mood normal.        Behavior: Behavior normal.      UC Treatments / Results  Labs (all labs ordered are listed, but only abnormal results are displayed) Labs Reviewed - No data to display  EKG   Radiology No results found.  Procedures Procedures (including critical care time)  Medications Ordered in UC Medications - No data to display  Initial Impression / Assessment and Plan / UC Course  I have reviewed the triage  vital signs  and the nursing notes.  Pertinent labs & imaging results that were available during my care of the patient were reviewed by me and considered in my medical decision making (see chart for details).     Onycholysis of toenail appears due to trauma. Keep toe elevated Apply ice to toe 15 minutes 4 times per day.  Final Clinical Impressions(s) / UC Diagnoses   Final diagnoses:  Contusion of left great toe with damage to nail, initial encounter  Onycholysis of toenail     Discharge Instructions     Apply ice to toe Keep toe elevated Take ibuprofen as needed for pain.      ED Prescriptions    None     PDMP not reviewed this encounter.   Evern Core, PA-C 07/12/20 1503

## 2020-07-21 ENCOUNTER — Other Ambulatory Visit: Payer: Self-pay

## 2020-07-21 ENCOUNTER — Ambulatory Visit: Payer: Medicaid Other | Admitting: Family

## 2020-08-09 ENCOUNTER — Encounter (HOSPITAL_COMMUNITY): Payer: Self-pay | Admitting: *Deleted

## 2020-08-09 ENCOUNTER — Encounter: Payer: Self-pay | Admitting: Family

## 2020-08-09 ENCOUNTER — Ambulatory Visit (HOSPITAL_COMMUNITY)
Admission: EM | Admit: 2020-08-09 | Discharge: 2020-08-09 | Disposition: A | Discharge status: Home / Self Care | Payer: Medicaid Other | Attending: Emergency Medicine | Admitting: Emergency Medicine

## 2020-08-09 ENCOUNTER — Other Ambulatory Visit: Payer: Self-pay

## 2020-08-09 DIAGNOSIS — U071 COVID-19: Secondary | ICD-10-CM | POA: Insufficient documentation

## 2020-08-09 DIAGNOSIS — R0981 Nasal congestion: Secondary | ICD-10-CM | POA: Diagnosis not present

## 2020-08-09 DIAGNOSIS — Z20822 Contact with and (suspected) exposure to covid-19: Secondary | ICD-10-CM | POA: Diagnosis present

## 2020-08-09 DIAGNOSIS — J029 Acute pharyngitis, unspecified: Secondary | ICD-10-CM | POA: Diagnosis not present

## 2020-08-09 DIAGNOSIS — R059 Cough, unspecified: Secondary | ICD-10-CM | POA: Diagnosis not present

## 2020-08-09 DIAGNOSIS — J069 Acute upper respiratory infection, unspecified: Secondary | ICD-10-CM | POA: Diagnosis present

## 2020-08-09 LAB — SARS CORONAVIRUS 2 (TAT 6-24 HRS): SARS Coronavirus 2: POSITIVE — AB

## 2020-08-09 LAB — POCT RAPID STREP A, ED / UC: Streptococcus, Group A Screen (Direct): NEGATIVE

## 2020-08-09 LAB — POC INFLUENZA A AND B ANTIGEN (URGENT CARE ONLY)
Influenza A Ag: NEGATIVE
Influenza B Ag: NEGATIVE

## 2020-08-09 MED ORDER — IBUPROFEN 600 MG PO TABS: 600.0000 mg | ORAL_TABLET | Freq: Four times a day (QID) | ORAL | 0 refills | Status: DC | PRN

## 2020-08-09 NOTE — ED Provider Notes (Addendum)
HPI  SUBJECTIVE:  Patient reports sore throat starting 2 to 3 days ago. Sx worse with swallowing.  Sx better with nothing. Has been alternating Tylenol and ibuprofen without improvement + Bilateral ear pain. No fevers   No neck stiffness  No Cough + nasal congestion, rhinorrhea starting yesterday + Myalgias starting yesterday + Headache starting yesterday No Rash  No loss of taste or smell No shortness of breath or difficulty breathing No nausea, vomiting No diarrhea No abdominal pain     No Recent Strep, mono, COVID, flu exposure No reflux sxs No Allergy sxs  No Breathing difficulty, voice changes, sensation of throat swelling shut No Drooling No Trismus No abx in past month.  Did not get the flu or COVID-vaccine No antipyretic in past 4-6 hrs Patient is breast-feeding Past medical history of hypertension, murmur.  No history of diabetes.  LMP: Has a Nexplanon.  Denies the possibility of being pregnant.  PMD: [facility]   Past Medical History:  Diagnosis Date  . Gallstone   . Headache(784.0)   . Heart murmur   . Hypertension   . Infection    UTI  . Maternal chronic hypertension in first trimester 04/09/2011  . Pregnancy induced hypertension    after 2nd delivery  . Vaginal Pap smear, abnormal    cant remember    Past Surgical History:  Procedure Laterality Date  . DILATION AND CURETTAGE OF UTERUS     dont remember year  . LEEP      Family History  Problem Relation Age of Onset  . Asthma Mother   . Hypertension Mother   . CAD Mother   . Asthma Maternal Grandmother   . Hypertension Maternal Grandmother     Social History   Tobacco Use  . Smoking status: Never Smoker  . Smokeless tobacco: Never Used  Vaping Use  . Vaping Use: Never used  Substance Use Topics  . Alcohol use: No    Alcohol/week: 0.0 standard drinks  . Drug use: No    No current facility-administered medications for this encounter.  Current Outpatient Medications:   .  acetaminophen (TYLENOL) 500 MG tablet, Take 500 mg by mouth every 6 (six) hours as needed., Disp: , Rfl:  .  amLODipine (NORVASC) 10 MG tablet, Take 1 tablet (10 mg total) by mouth daily., Disp: 90 tablet, Rfl: 3 .  etonogestrel (NEXPLANON) 68 MG IMPL implant, 1 each by Subdermal route once., Disp: , Rfl:  .  ibuprofen (ADVIL) 600 MG tablet, Take 1 tablet (600 mg total) by mouth every 6 (six) hours as needed., Disp: 30 tablet, Rfl: 0 .  Multiple Vitamin (MULTIVITAMIN WITH MINERALS) TABS tablet, Take 1 tablet by mouth daily., Disp: , Rfl:   No Known Allergies   ROS  As noted in HPI.   Physical Exam  BP (!) 143/90   Pulse 100   Temp 99.2 F (37.3 C) (Oral)   Resp 20   SpO2 100%   Breastfeeding Yes   Constitutional: Well developed, well nourished, no acute distress Eyes:  EOMI, conjunctiva normal bilaterally HENT: Normocephalic, atraumatic,mucus membranes moist. + left-sided nasal congestion + erythematous oropharynx Limited view of tonsils, do not seem enlarged.  No apparent exudates.  Respiratory: Normal inspiratory effort Cardiovascular: Normal rate, no murmurs, rubs, gallops GI: nondistended, nontender. No appreciable splenomegaly skin: No rash, skin intact Lymph: + Anterior cervical LN.  No posterior cervical lymphadenopathy Musculoskeletal: no deformities Neurologic: Alert & oriented x 3, no focal neuro deficits Psychiatric: Speech  and behavior appropriate.  ED Course   Medications - No data to display  Orders Placed This Encounter  Procedures  . SARS CORONAVIRUS 2 (TAT 6-24 HRS) Nasopharyngeal Nasopharyngeal Swab    Standing Status:   Standing    Number of Occurrences:   1    Order Specific Question:   Is this test for diagnosis or screening    Answer:   Diagnosis of ill patient    Order Specific Question:   Symptomatic for COVID-19 as defined by CDC    Answer:   Yes    Order Specific Question:   Date of Symptom Onset    Answer:   08/07/2020    Order  Specific Question:   Hospitalized for COVID-19    Answer:   No    Order Specific Question:   Admitted to ICU for COVID-19    Answer:   No    Order Specific Question:   Previously tested for COVID-19    Answer:   No    Order Specific Question:   Resident in a congregate (group) care setting    Answer:   No    Order Specific Question:   Employed in healthcare setting    Answer:   No    Order Specific Question:   Pregnant    Answer:   No    Order Specific Question:   Has patient completed COVID vaccination(s) (2 doses of Pfizer/Moderna 1 dose of [employer])    Answer:   Unknown  . Culture, group A strep (throat)    Standing Status:   Standing    Number of Occurrences:   1  . POCT Rapid Strep A    Standing Status:   Standing    Number of Occurrences:   1    Results for orders placed or performed during the hospital encounter of 08/09/20 (from the past 24 hour(s))  POCT Rapid Strep A     Status: None   Collection Time: 08/09/20  1:58 PM  Result Value Ref Range   Streptococcus, Group A Screen (Direct) NEGATIVE NEGATIVE  SARS CORONAVIRUS 2 (TAT 6-24 HRS) Nasopharyngeal Nasopharyngeal Swab     Status: Abnormal   Collection Time: 08/09/20  7:08 PM   Specimen: Nasopharyngeal Swab  Result Value Ref Range   SARS Coronavirus 2 POSITIVE (A) NEGATIVE     No results found.  ED Clinical Impression  1. COVID-19 virus infection   2. Acute pharyngitis, unspecified etiology   3. Upper respiratory tract infection, unspecified type   4. Encounter for laboratory testing for COVID-19 virus      ED Assessment/Plan  Checking flu, COVID, strep.  Suspect COVID.  Flu, strep negative.  Flu not crossing over  Rapid strep negative. Obtaining throat culture to guide antibiotic treatment. Discussed this with patient.  We'll contact her if culture is positive, and will call in Appropriate antibiotics. Patient home with ibuprofen, Tylenol, Benadryl/Maalox mixture. Patient to followup with PMD  when necessary.  Work note for 2 days.  2992 1.24.22- COVID positive. Attempted to reach pt at phone number on record, left voicemail asking her to call the UC.    Discussed result with pt. Advised isolation 5 days. Pt requesting letter to be sent to [facility] or email for work.   Will have staff do this.   Discussed labs,  MDM, plan and followup with patient. Discussed sn/sx that should prompt return to the ED. patient agrees with plan.   Meds ordered this encounter  Medications  . ibuprofen (ADVIL) 600 MG tablet    Sig: Take 1 tablet (600 mg total) by mouth every 6 (six) hours as needed.    Dispense:  30 tablet    Refill:  0    *This clinic note was created using Scientist, clinical (histocompatibility and immunogenetics). Therefore, there may be occasional mistakes despite careful proofreading.     Domenick Gong, MD 08/09/20 1557    Domenick Gong, MD 08/10/20 [place]    Domenick Gong, MD 08/10/20 [phone]

## 2020-08-09 NOTE — Discharge Instructions (Addendum)
your rapid strep was negative today, so we have sent off a throat culture.  We will contact you and call in the appropriate antibiotics if your culture comes back positive for an infection requiring antibiotic treatment.  Give [place] a working phone number.   Your flu antigen test is negative.  Your COVID will be back within 24 hours.  1 gram of Tylenol and 600 mg ibuprofen together 3-4 times a day as needed for pain.  Make sure you drink plenty of extra fluids.  Some people find salt water gargles and  Traditional Medicinal's "Throat Coat" tea helpful. Take 5 mL of liquid Benadryl and 5 mL of Maalox. Mix it together, and then hold it in your mouth for as long as you can and then swallow. You may do this 4 times a day.    Go to www.goodrx.com to look up your medications. This will give you a list of where you can find your prescriptions at the most affordable prices. Or ask the pharmacist what the cash price is, or if they have any other discount programs available to help make your medication more affordable. This can be less expensive than what you would pay with insurance.

## 2020-08-09 NOTE — ED Triage Notes (Signed)
C/O sore throat x 2-3 days.  Last night started with body aches, cough, nasal congestion, HA.

## 2020-08-09 NOTE — ED Notes (Signed)
Flu A Negative (-) Flu B Negative (-) Domenick Gong MD notified

## 2020-08-10 ENCOUNTER — Telehealth (HOSPITAL_COMMUNITY): Payer: Self-pay | Admitting: Emergency Medicine

## 2020-08-10 ENCOUNTER — Ambulatory Visit: Payer: Self-pay

## 2020-08-10 ENCOUNTER — Telehealth: Payer: Self-pay

## 2020-08-10 ENCOUNTER — Encounter (HOSPITAL_COMMUNITY): Payer: Self-pay | Admitting: Emergency Medicine

## 2020-08-10 NOTE — Telephone Encounter (Signed)
Called to discuss with patient about COVID-19 symptoms and the use of one of the available treatments for those with mild to moderate Covid symptoms and at a high risk of hospitalization.  Pt appears to qualify for outpatient treatment due to co-morbid conditions and/or a member of an at-risk group in accordance with the FDA Emergency Use Authorization.    Symptom onset: 08/06/20 Vaccinated: No  Booster? No Immunocompromised? No Qualifiers: None  Unable to reach pt - Reached pt.No qualifiers. Will contact PCP as needed.  Brittany [name]

## 2020-08-10 NOTE — Telephone Encounter (Signed)
Pt covid positive. No answer, left VM to call the UC AM

## 2020-08-11 LAB — CULTURE, GROUP A STREP (THRC)

## 2020-08-17 ENCOUNTER — Encounter: Payer: Self-pay | Admitting: Family

## 2020-08-21 ENCOUNTER — Ambulatory Visit: Payer: Medicaid Other | Admitting: Family

## 2020-09-27 ENCOUNTER — Other Ambulatory Visit: Payer: Self-pay

## 2020-09-27 ENCOUNTER — Encounter (HOSPITAL_COMMUNITY): Payer: Self-pay | Admitting: Emergency Medicine

## 2020-09-27 ENCOUNTER — Ambulatory Visit (HOSPITAL_COMMUNITY)
Admission: EM | Admit: 2020-09-27 | Discharge: 2020-09-27 | Disposition: A | Discharge status: Home / Self Care | Payer: Managed Care, Other (non HMO) | Attending: Student | Admitting: Student

## 2020-09-27 DIAGNOSIS — J301 Allergic rhinitis due to pollen: Secondary | ICD-10-CM | POA: Insufficient documentation

## 2020-09-27 DIAGNOSIS — J069 Acute upper respiratory infection, unspecified: Secondary | ICD-10-CM | POA: Insufficient documentation

## 2020-09-27 DIAGNOSIS — Z1152 Encounter for screening for COVID-19: Secondary | ICD-10-CM | POA: Insufficient documentation

## 2020-09-27 DIAGNOSIS — Z8616 Personal history of COVID-19: Secondary | ICD-10-CM | POA: Diagnosis not present

## 2020-09-27 LAB — POC INFLUENZA A AND B ANTIGEN (URGENT CARE ONLY)
Influenza A Ag: NEGATIVE
Influenza B Ag: NEGATIVE

## 2020-09-27 MED ORDER — CETIRIZINE HCL 10 MG PO TABS: 10.0000 mg | ORAL_TABLET | Freq: Every day | ORAL | 2 refills | Status: DC

## 2020-09-27 NOTE — ED Provider Notes (Signed)
[facility]    CSN: [id number] Arrival date & time: 09/27/20  1643      History   Chief Complaint Chief Complaint  Patient presents with  . URI    HPI Brittany [name] is a 40 y.o. female presenting with viral URI symptoms. History heart murmur, hypertension, UTI, hyperthyroidism.  Notes 3 days of sore throat, body aches, headaches, cough.  Was Covid positive on 08-09-20 but states she needs to be tested again today. Some nasal congestion she attributes to seasonal allergies, takes no medication for these. Denies fevers/chills, n/v/d, shortness of breath, chest pain, facial pain, teeth pain, loss of taste/smell, swollen lymph nodes, ear pain, trouble swallowing.    HPI  Past Medical History:  Diagnosis Date  . Gallstone   . Headache(784.0)   . Heart murmur   . Hypertension   . Infection    UTI  . Maternal chronic hypertension in first trimester 04/09/2011  . Pregnancy induced hypertension    after 2nd delivery  . Vaginal Pap smear, abnormal    cant remember    Patient Active Problem List   Diagnosis Date Noted  . Sore throat 11/01/2018  . 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 Vitamin D deficiency 12/14/2017  . Murmur, cardiac 11/30/2016  . Anemia 11/30/2016  . Migraine 03/18/2016  . Subclinical hyperthyroidism 12/09/2015  . Chronic hypertension with superimposed preeclampsia 04/09/2011    Past Surgical History:  Procedure Laterality Date  . DILATION AND CURETTAGE OF UTERUS     dont remember year  . LEEP      OB History    Gravida  9   Para  4   Term  2   Preterm  2   AB  5   Living  4     SAB  4   IAB  1   Ectopic  0   Multiple  0   Live Births  4            Home Medications    Prior to Admission medications   Medication Sig Start Date End Date Taking? Authorizing Provider  cetirizine (ZYRTEC ALLERGY) 10 MG tablet Take 1 tablet (10 mg total) by mouth daily. 09/27/20  Yes Rhys Martini, PA-C  acetaminophen  (TYLENOL) 500 MG tablet Take 500 mg by mouth every 6 (six) hours as needed.    [provider]  amLODipine (NORVASC) 10 MG tablet Take 1 tablet (10 mg total) by mouth daily. 11/11/19   Olive Bass, FNP  etonogestrel (NEXPLANON) 68 MG IMPL implant 1 each by Subdermal route once.    [provider]  ibuprofen (ADVIL) 600 MG tablet Take 1 tablet (600 mg total) by mouth every 6 (six) hours as needed. 08/09/20   Domenick Gong, MD  Multiple Vitamin (MULTIVITAMIN WITH MINERALS) TABS tablet Take 1 tablet by mouth daily.    [provider]  ipratropium (ATROVENT) 0.06 % nasal spray Place 2 sprays into both nostrils 4 (four) times daily. 11/21/19 01/15/20  Belinda Fisher, PA-C    Family History Family History  Problem Relation Age of Onset  . Asthma Mother   . Hypertension Mother   . CAD Mother   . Asthma Maternal Grandmother   . Hypertension Maternal Grandmother     Social History Social History   Tobacco Use  . Smoking status: Never Smoker  . Smokeless tobacco: Never Used  Vaping Use  . Vaping Use: Never used  Substance Use Topics  . Alcohol  use: No    Alcohol/week: 0.0 standard drinks  . Drug use: No     Allergies   Patient has no known allergies.   Review of Systems Review of Systems  Constitutional: Positive for fatigue. Negative for appetite change, chills and fever.  HENT: Positive for congestion and sore throat. Negative for ear pain, rhinorrhea, sinus pressure and sinus pain.   Eyes: Negative for redness and visual disturbance.  Respiratory: Positive for cough. Negative for chest tightness, shortness of breath and wheezing.   Cardiovascular: Negative for chest pain and palpitations.  Gastrointestinal: Negative for abdominal pain, constipation, diarrhea, nausea and vomiting.  Genitourinary: Negative for dysuria, frequency and urgency.  Musculoskeletal: Positive for myalgias.  Neurological: Negative for dizziness, weakness and headaches.   Psychiatric/Behavioral: Negative for confusion.  All other systems reviewed and are negative.    Physical Exam Triage Vital Signs ED Triage Vitals  Enc Vitals Group     BP 09/27/20 1655 133/86     Pulse Rate 09/27/20 1655 87     Resp 09/27/20 1655 20     Temp 09/27/20 1655 98.6 F (37 C)     Temp Source 09/27/20 1655 Oral     SpO2 09/27/20 1655 100 %     Weight --      Height --      Head Circumference --      Peak Flow --      Pain Score 09/27/20 1658 8     Pain Loc --      Pain Edu? --      Excl. in GC? --    No data found.  Updated Vital Signs BP 133/86 (BP Location: Left Arm)   Pulse 87   Temp 98.6 F (37 C) (Oral)   Resp 20   SpO2 100%   Breastfeeding Yes   Visual Acuity Right Eye Distance:   Left Eye Distance:   Bilateral Distance:    Right Eye Near:   Left Eye Near:    Bilateral Near:     Physical Exam Vitals reviewed.  Constitutional:      General: She is not in acute distress.    Appearance: Normal appearance. She is ill-appearing.  HENT:     Head: Normocephalic and atraumatic.     Right Ear: Hearing, tympanic membrane, ear canal and external ear normal. No swelling or tenderness. There is no impacted cerumen. No mastoid tenderness. Tympanic membrane is not perforated, erythematous, retracted or bulging.     Left Ear: Hearing, tympanic membrane, ear canal and external ear normal. No swelling or tenderness. There is no impacted cerumen. No mastoid tenderness. Tympanic membrane is not perforated, erythematous, retracted or bulging.     Nose:     Right Sinus: No maxillary sinus tenderness or frontal sinus tenderness.     Left Sinus: No maxillary sinus tenderness or frontal sinus tenderness.     Mouth/Throat:     Mouth: Mucous membranes are moist.     Pharynx: Uvula midline. No oropharyngeal exudate or posterior oropharyngeal erythema.     Tonsils: No tonsillar exudate. 0 on the right. 0 on the left.     Comments: Smooth velvety erythema posterior  pharynx Cardiovascular:     Rate and Rhythm: Normal rate and regular rhythm.     Heart sounds: Normal heart sounds.  Pulmonary:     Breath sounds: Normal breath sounds and air entry. No wheezing, rhonchi or rales.  Chest:     Chest wall: No tenderness.  Abdominal:  General: Abdomen is flat. Bowel sounds are normal.     Tenderness: There is no abdominal tenderness. There is no guarding or rebound.  Lymphadenopathy:     Cervical: No cervical adenopathy.  Neurological:     General: No focal deficit present.     Mental Status: She is alert and oriented to person, place, and time.  Psychiatric:        Attention and Perception: Attention and perception normal.        Mood and Affect: Mood and affect normal.        Behavior: Behavior normal. Behavior is cooperative.        Thought Content: Thought content normal.        Judgment: Judgment normal.      UC Treatments / Results  Labs (all labs ordered are listed, but only abnormal results are displayed) Labs Reviewed - No data to display  EKG   Radiology No results found.  Procedures Procedures (including critical care time)  Medications Ordered in UC Medications - No data to display  Initial Impression / Assessment and Plan / UC Course  I have reviewed the triage vital signs and the nursing notes.  Pertinent labs & imaging results that were available during my care of the patient were reviewed by me and considered in my medical decision making (see chart for details).     This patient is a 40 year old female presenting with viral URI symptoms.  Today she is afebrile, nontachycardic, nontachypneic, oxygenating well on room air with no wheezes rhonchi or rales.  This patient was Covid positive 2 months ago.  We discussed that Covid test is not indicated today as she has had Covid in the last 3 months, per CDC guidelines.  She is adamant that she requires another Covid test today.  Covid PCR sent.  Rapid flu  negative.  Symptomatic relief with Tylenol, ibuprofen, Mucinex, Zyrtec.  Recommended good hydration.  Return precautions discussed.  This chart was dictated using voice recognition software, Dragon. Despite the best efforts of this provider to proofread and correct errors, errors may still occur which can change documentation meaning.   Final Clinical Impressions(s) / UC Diagnoses   Final diagnoses:  History of COVID-19  Encounter for screening for COVID-19  Viral URI  Seasonal allergic rhinitis due to pollen     Discharge Instructions     -For your body aches, sore throat, headaches-use Tylenol and ibuprofen.  You can take Tylenol 1000 mg 3 times daily and ibuprofen 800 mg 3 times daily taken with food. -I sent a prescription of Zyrtec, which is a nondrowsy antihistamine that you can try for your allergies.  Take 1 pill daily.  Try this for at least 1 week, and if you like it you can continue it. -Try Mucinex over-the-counter for congestion. -We are testing you for Covid and flu today.     ED Prescriptions    Medication Sig Dispense Auth. Provider   cetirizine (ZYRTEC ALLERGY) 10 MG tablet Take 1 tablet (10 mg total) by mouth daily. 30 tablet Rhys Martini, PA-C     PDMP not reviewed this encounter.   Rhys Martini, PA-C 09/27/20 1739

## 2020-09-27 NOTE — ED Triage Notes (Signed)
Pt c/o cold sx onset 3 days associated w/sore throat, body aches, headaches, cough  Tested positive for COVID on 08/09/20  A&O x4... NAD.Marland Kitchen. ambulatory

## 2020-09-27 NOTE — Discharge Instructions (Signed)
-  For your body aches, sore throat, headaches-use Tylenol and ibuprofen.  You can take Tylenol 1000 mg 3 times daily and ibuprofen 800 mg 3 times daily taken with food. -I sent a prescription of Zyrtec, which is a nondrowsy antihistamine that you can try for your allergies.  Take 1 pill daily.  Try this for at least 1 week, and if you like it you can continue it. -Try Mucinex over-the-counter for congestion. -We are testing you for Covid and flu today. -Come back and see [place] if you develop shortness of breath, fever/chills, abdominal pain, chest pain, etc.

## 2020-09-28 LAB — SARS CORONAVIRUS 2 (TAT 6-24 HRS): SARS Coronavirus 2: NEGATIVE

## 2020-10-12 ENCOUNTER — Encounter: Payer: Self-pay | Admitting: Family

## 2020-10-13 ENCOUNTER — Other Ambulatory Visit: Payer: Self-pay | Admitting: Family

## 2020-10-13 MED ORDER — AMLODIPINE BESYLATE 10 MG PO TABS: 10.0000 mg | ORAL_TABLET | Freq: Every day | ORAL | 1 refills | Status: DC

## 2020-11-24 ENCOUNTER — Encounter: Payer: Self-pay | Admitting: Family

## 2020-11-24 ENCOUNTER — Encounter (HOSPITAL_COMMUNITY): Payer: Self-pay

## 2020-11-24 ENCOUNTER — Ambulatory Visit (HOSPITAL_COMMUNITY)
Admission: EM | Admit: 2020-11-24 | Discharge: 2020-11-24 | Disposition: A | Discharge status: Home / Self Care | Payer: Managed Care, Other (non HMO) | Attending: Emergency Medicine | Admitting: Emergency Medicine

## 2020-11-24 ENCOUNTER — Telehealth (INDEPENDENT_AMBULATORY_CARE_PROVIDER_SITE_OTHER): Payer: Medicaid Other | Admitting: Family

## 2020-11-24 ENCOUNTER — Other Ambulatory Visit: Payer: Self-pay

## 2020-11-24 VITALS — Ht 65.0 in | Wt 177.0 lb

## 2020-11-24 DIAGNOSIS — Z87442 Personal history of urinary calculi: Secondary | ICD-10-CM | POA: Insufficient documentation

## 2020-11-24 DIAGNOSIS — Z793 Long term (current) use of hormonal contraceptives: Secondary | ICD-10-CM | POA: Diagnosis not present

## 2020-11-24 DIAGNOSIS — R6889 Other general symptoms and signs: Secondary | ICD-10-CM | POA: Diagnosis not present

## 2020-11-24 DIAGNOSIS — R319 Hematuria, unspecified: Secondary | ICD-10-CM | POA: Insufficient documentation

## 2020-11-24 DIAGNOSIS — I10 Essential (primary) hypertension: Secondary | ICD-10-CM | POA: Insufficient documentation

## 2020-11-24 DIAGNOSIS — Z8616 Personal history of COVID-19: Secondary | ICD-10-CM | POA: Insufficient documentation

## 2020-11-24 DIAGNOSIS — Z79899 Other long term (current) drug therapy: Secondary | ICD-10-CM | POA: Insufficient documentation

## 2020-11-24 DIAGNOSIS — J029 Acute pharyngitis, unspecified: Secondary | ICD-10-CM | POA: Insufficient documentation

## 2020-11-24 DIAGNOSIS — N390 Urinary tract infection, site not specified: Secondary | ICD-10-CM | POA: Insufficient documentation

## 2020-11-24 DIAGNOSIS — R059 Cough, unspecified: Secondary | ICD-10-CM | POA: Insufficient documentation

## 2020-11-24 DIAGNOSIS — M549 Dorsalgia, unspecified: Secondary | ICD-10-CM | POA: Diagnosis not present

## 2020-11-24 DIAGNOSIS — Z20822 Contact with and (suspected) exposure to covid-19: Secondary | ICD-10-CM | POA: Insufficient documentation

## 2020-11-24 LAB — POCT URINALYSIS DIPSTICK, ED / UC
Bilirubin Urine: NEGATIVE
Glucose, UA: NEGATIVE mg/dL
Ketones, ur: NEGATIVE mg/dL
Nitrite: NEGATIVE
Protein, ur: NEGATIVE mg/dL
Specific Gravity, Urine: 1.015 (ref 1.005–1.030)
Urobilinogen, UA: 0.2 mg/dL (ref 0.0–1.0)
pH: 7 (ref 5.0–8.0)

## 2020-11-24 LAB — POC INFLUENZA A AND B ANTIGEN (URGENT CARE ONLY)
INFLUENZA A ANTIGEN, POC: NEGATIVE
INFLUENZA B ANTIGEN, POC: NEGATIVE

## 2020-11-24 LAB — POCT RAPID STREP A, ED / UC: Streptococcus, Group A Screen (Direct): NEGATIVE

## 2020-11-24 MED ORDER — IBUPROFEN 800 MG PO TABS
ORAL_TABLET | ORAL | Status: AC
  Filled 2020-11-24: qty 1

## 2020-11-24 MED ORDER — IBUPROFEN 600 MG PO TABS: 600.0000 mg | ORAL_TABLET | Freq: Four times a day (QID) | ORAL | 0 refills | Status: DC | PRN

## 2020-11-24 MED ORDER — ACETAMINOPHEN 325 MG PO TABS
ORAL_TABLET | ORAL | Status: AC
  Filled 2020-11-24: qty 3

## 2020-11-24 MED ORDER — ACETAMINOPHEN 325 MG PO TABS
975.0000 mg | ORAL_TABLET | Freq: Once | ORAL | Status: AC
  Administered 2020-11-24: 975 mg via ORAL

## 2020-11-24 MED ORDER — CEPHALEXIN 500 MG PO CAPS: 500.0000 mg | ORAL_CAPSULE | Freq: Four times a day (QID) | ORAL | 0 refills | Status: DC

## 2020-11-24 MED ORDER — IBUPROFEN 800 MG PO TABS
800.0000 mg | ORAL_TABLET | Freq: Once | ORAL | Status: AC
  Administered 2020-11-24: 800 mg via ORAL

## 2020-11-24 MED ORDER — PHENAZOPYRIDINE HCL 200 MG PO TABS: 200.0000 mg | ORAL_TABLET | Freq: Three times a day (TID) | ORAL | 0 refills | Status: DC | PRN

## 2020-11-24 NOTE — Discharge Instructions (Addendum)
Flu and rapid strep tests were negative.  Your COVID test will be back in 6 to 24 hours your urinalysis is suggestive of a urinary tract infection.  I am sending you home on Keflex which will also cover a strep pharyngitis.  Finish the Keflex, even if you feel better.  Push plenty of extra fluids.  600 mg of ibuprofen combined with 1000 mg of Tylenol together 3-4 times a day as needed for pain.  This will turn your urine orange, but will help with your urinary symptoms.    Benadryl/Maalox, Flonase, saline nasal irrigation as we discussed for your nasal congestion and sore throat.

## 2020-11-24 NOTE — ED Triage Notes (Signed)
Pt reports headache, body aches, cough and chest congestion, lower back pain and increased urinary frequency  x 2 days.

## 2020-11-24 NOTE — Progress Notes (Signed)
Brittany [name] is a 40 y.o. female with the following history as recorded in [employer]:  Patient Active Problem List   Diagnosis Date Noted  . Sore throat 11/01/2018  . 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 Vitamin D deficiency 12/14/2017  . Murmur, cardiac 11/30/2016  . Anemia 11/30/2016  . Migraine 03/18/2016  . Subclinical hyperthyroidism 12/09/2015  . Chronic hypertension with superimposed preeclampsia 04/09/2011    Current Outpatient Medications  Medication Sig Dispense Refill  . acetaminophen (TYLENOL) 500 MG tablet Take 500 mg by mouth every 6 (six) hours as needed.    Marland Kitchen amLODipine (NORVASC) 10 MG tablet Take 1 tablet (10 mg total) by mouth daily. 30 tablet 1  . cetirizine (ZYRTEC ALLERGY) 10 MG tablet Take 1 tablet (10 mg total) by mouth daily. 30 tablet 2  . etonogestrel (NEXPLANON) 68 MG IMPL implant 1 each by Subdermal route once.    . Multiple Vitamin (MULTIVITAMIN WITH MINERALS) TABS tablet Take 1 tablet by mouth daily.     No current facility-administered medications for this visit.    Allergies: Patient has no known allergies.  Past Medical History:  Diagnosis Date  . Gallstone   . Headache(784.0)   . Heart murmur   . Hypertension   . Infection    UTI  . Maternal chronic hypertension in first trimester 04/09/2011  . Pregnancy induced hypertension    after 2nd delivery  . Vaginal Pap smear, abnormal    cant remember    Past Surgical History:  Procedure Laterality Date  . DILATION AND CURETTAGE OF UTERUS     dont remember year  . LEEP      Family History  Problem Relation Age of Onset  . Asthma Mother   . Hypertension Mother   . CAD Mother   . Asthma Maternal Grandmother   . Hypertension Maternal Grandmother     Social History   Tobacco Use  . Smoking status: Never Smoker  . Smokeless tobacco: Never Used  Substance Use Topics  . Alcohol use: No    Alcohol/week: 0.0 standard drinks    Subjective:    I connected with Mayer Camel  on 11/24/20 at  2:20 PM EDT by a telephone call and verified that I am speaking with the correct person using two identifiers.   I discussed the limitations of evaluation and management by telemedicine and the availability of in person appointments. The patient expressed understanding and agreed to proceed. Provider in office/ patient is at home; provider and patient are only 2 people on telephone call.   Complaining of severe back pain/ body aches; limited relief with OTC Ibuprofen/ Tylenol; notes she tried to go to [facility] earlier today but could not be seen because she did not have her insurance card;     Objective:  Vitals:   11/24/20 1418  Weight: 177 lb (80.3 kg)  Height: 5\' 5"  (1.651 m)    Lungs: Respirations unlabored;  Neurologic: Alert and oriented; speech intact;   Assessment:  1. Back pain, unspecified back location, unspecified back pain laterality, unspecified chronicity     Plan:  Explained to patient that she needs to be seen in person due to her description of the severity of the pain and recommended that she go to [facility];  Time spent 7 minutes   No follow-ups on file.  No orders of the defined types were placed in this encounter.   Requested Prescriptions    No prescriptions requested or ordered in this encounter

## 2020-11-24 NOTE — ED Provider Notes (Signed)
HPI  SUBJECTIVE:  Brittany [name] is a 40 y.o. female who presents with 2 days of body aches, headaches, sore throat, nasal congestion, rhinorrhea, postnasal drip, cough, chest soreness secondary to the cough.  She also reports urinary urgency and frequency.  She denies fevers, shortness of breath, loss of sense of smell or taste, nausea, vomiting, diarrhea, abdominal pain.  No dysuria, cloudy or odorous urine, hematuria, pelvic pain.  No vaginal complaints.  No known COVID exposure.  She did not get the vaccine.  No antipyretic in the past 6 hours.  She tried Tylenol 1000 mg without improvement in her symptoms.  She has a worse with activity.  She has a past medical history of hypertension COVID in January 22 and nonobstructing nephrolithiasis.  Past medical history negative for diabetes, frequent strep, pyelonephritis, UTI.  LMP: She has a Nexplanon.  She denies the possibility of being pregnant.  PMD: [facility].   Past Medical History:  Diagnosis Date  . Gallstone   . Headache(784.0)   . Heart murmur   . Hypertension   . Infection    UTI  . Maternal chronic hypertension in first trimester 04/09/2011  . Pregnancy induced hypertension    after 2nd delivery  . Vaginal Pap smear, abnormal    cant remember    Past Surgical History:  Procedure Laterality Date  . DILATION AND CURETTAGE OF UTERUS     dont remember year  . LEEP      Family History  Problem Relation Age of Onset  . Asthma Mother   . Hypertension Mother   . CAD Mother   . Asthma Maternal Grandmother   . Hypertension Maternal Grandmother     Social History   Tobacco Use  . Smoking status: Never Smoker  . Smokeless tobacco: Never Used  Vaping Use  . Vaping Use: Never used  Substance Use Topics  . Alcohol use: No    Alcohol/week: 0.0 standard drinks  . Drug use: No    No current facility-administered medications for this encounter.  Current Outpatient Medications:  .  cephALEXin (KEFLEX) 500 MG  capsule, Take 1 capsule (500 mg total) by mouth 4 (four) times daily for 10 days., Disp: 40 capsule, Rfl: 0 .  ibuprofen (ADVIL) 600 MG tablet, Take 1 tablet (600 mg total) by mouth every 6 (six) hours as needed., Disp: 30 tablet, Rfl: 0 .  phenazopyridine (PYRIDIUM) 200 MG tablet, Take 1 tablet (200 mg total) by mouth 3 (three) times daily as needed for pain., Disp: 6 tablet, Rfl: 0 .  acetaminophen (TYLENOL) 500 MG tablet, Take 500 mg by mouth every 6 (six) hours as needed., Disp: , Rfl:  .  amLODipine (NORVASC) 10 MG tablet, Take 1 tablet (10 mg total) by mouth daily., Disp: 30 tablet, Rfl: 1 .  etonogestrel (NEXPLANON) 68 MG IMPL implant, 1 each by Subdermal route once., Disp: , Rfl:  .  Multiple Vitamin (MULTIVITAMIN WITH MINERALS) TABS tablet, Take 1 tablet by mouth daily., Disp: , Rfl:   No Known Allergies   ROS  As noted in HPI.   Physical Exam  BP 129/81 (BP Location: Right Arm)   Pulse 80   Temp 97.6 F (36.4 C) (Oral)   Resp 18   SpO2 100%   Constitutional: Well developed, well nourished, no acute distress Eyes:  EOMI, conjunctiva normal bilaterally HENT: Normocephalic, atraumatic,mucus membranes moist.  Minimal nasal congestion.  Erythematous, swollen tonsils without exudates.  Uvula midline. Neck no cervical lymphadenopathy Respiratory:  Normal inspiratory effort, lungs clear bilaterally Cardiovascular: Normal rate regular rhythm no murmurs GI: nondistended soft.  Positive suprapubic tenderness.  No flank tenderness.  No splenomegaly. Back: Positive left CVAT skin: No rash, skin intact Musculoskeletal: no deformities Neurologic: Alert & oriented x 3, no focal neuro deficits Psychiatric: Speech and behavior appropriate   ED Course   Medications  acetaminophen (TYLENOL) tablet 975 mg (975 mg Oral Given 11/24/20 1920)  ibuprofen (ADVIL) tablet 800 mg (800 mg Oral Given 11/24/20 1920)    Orders Placed This Encounter  Procedures  . SARS CORONAVIRUS 2 (TAT 6-24  HRS) Nasopharyngeal Nasopharyngeal Swab    Standing Status:   Standing    Number of Occurrences:   1    Order Specific Question:   Is this test for diagnosis or screening    Answer:   Diagnosis of ill patient    Order Specific Question:   Symptomatic for COVID-19 as defined by CDC    Answer:   Yes    Order Specific Question:   Date of Symptom Onset    Answer:   11/22/2020    Order Specific Question:   Hospitalized for COVID-19    Answer:   No    Order Specific Question:   Admitted to ICU for COVID-19    Answer:   No    Order Specific Question:   Previously tested for COVID-19    Answer:   Yes    Order Specific Question:   Resident in a congregate (group) care setting    Answer:   No    Order Specific Question:   Employed in healthcare setting    Answer:   No    Order Specific Question:   Pregnant    Answer:   No    Order Specific Question:   Has patient completed COVID vaccination(s) (2 doses of Pfizer/Moderna 1 dose of [employer])    Answer:   No  . Urine culture    Standing Status:   Standing    Number of Occurrences:   1    Order Specific Question:   List patient's active antibiotics    Answer:   keflex  . Culture, group A strep (throat)    Standing Status:   Standing    Number of Occurrences:   1  . POC Influenza A & B Ag (Urgent Care)    Standing Status:   Standing    Number of Occurrences:   1  . POCT Rapid Strep A    Standing Status:   Standing    Number of Occurrences:   1  . POCT Urinalysis Dipstick (ED/UC)    Standing Status:   Standing    Number of Occurrences:   1    Results for orders placed or performed during the hospital encounter of 11/24/20 (from the past 24 hour(s))  POCT Urinalysis Dipstick (ED/UC)     Status: Abnormal   Collection Time: 11/24/20  7:18 PM  Result Value Ref Range   Glucose, UA NEGATIVE NEGATIVE mg/dL   Bilirubin Urine NEGATIVE NEGATIVE   Ketones, ur NEGATIVE NEGATIVE mg/dL   Specific Gravity, Urine 1.015 1.005 - 1.030   Hgb  urine dipstick TRACE (A) NEGATIVE   pH 7.0 5.0 - 8.0   Protein, ur NEGATIVE NEGATIVE mg/dL   Urobilinogen, UA 0.2 0.0 - 1.0 mg/dL   Nitrite NEGATIVE NEGATIVE   Leukocytes,Ua TRACE (A) NEGATIVE  POCT Rapid Strep A     Status: None   Collection  Time: 11/24/20  7:22 PM  Result Value Ref Range   Streptococcus, Group A Screen (Direct) NEGATIVE NEGATIVE  SARS CORONAVIRUS 2 (TAT 6-24 HRS) Nasopharyngeal Nasopharyngeal Swab     Status: None   Collection Time: 11/24/20  7:25 PM   Specimen: Nasopharyngeal Swab  Result Value Ref Range   SARS Coronavirus 2 NEGATIVE NEGATIVE  POC Influenza A & B Ag (Urgent Care)     Status: None   Collection Time: 11/24/20  7:30 PM  Result Value Ref Range   INFLUENZA A ANTIGEN, POC NEGATIVE NEGATIVE   INFLUENZA B ANTIGEN, POC NEGATIVE NEGATIVE   No results found.  ED Clinical Impression  1. Urinary tract infection with hematuria, site unspecified   2. Sore throat   3. Flu-like symptoms      ED Assessment/Plan  In the differential is strep pharyngitis, COVID, flu, UTI/early pyelonephritis.    COVID pending.  Flu negative. Rapid strep negative.  Will send off strep culture  Her urinalysis has trace blood and leukocytes, so we will send this off for culture.  She has some suprapubic and left CVAT so we will treat as an ascending urinary tract infection with Keflex 500 mg 4 times daily for 10 days.  This will also cover strep pharyngitis. Pyridium.  Tylenol/ibuprofen, Benadryl/Maalox for the sore throat, restart Flonase and saline nasal irrigation for the nasal congestion.    COVID negative.  Symptoms most likely from a URI concomitant with a UTI.  Follow-up with PMD in several days if not getting any better.  To the ER if she gets worse  Discussed labs, imaging, MDM, treatment plan, and plan for follow-up with patient. Discussed sn/sx that should prompt return to the ED. patient agrees with plan.   Meds ordered this encounter  Medications  .  acetaminophen (TYLENOL) tablet 975 mg  . ibuprofen (ADVIL) tablet 800 mg  . cephALEXin (KEFLEX) 500 MG capsule    Sig: Take 1 capsule (500 mg total) by mouth 4 (four) times daily for 10 days.    Dispense:  40 capsule    Refill:  0  . phenazopyridine (PYRIDIUM) 200 MG tablet    Sig: Take 1 tablet (200 mg total) by mouth 3 (three) times daily as needed for pain.    Dispense:  6 tablet    Refill:  0  . ibuprofen (ADVIL) 600 MG tablet    Sig: Take 1 tablet (600 mg total) by mouth every 6 (six) hours as needed.    Dispense:  30 tablet    Refill:  0      *This clinic note was created using Scientist, clinical (histocompatibility and immunogenetics). Therefore, there may be occasional mistakes despite careful proofreading.  ?    Domenick Gong, MD 11/25/20 [id number]

## 2020-11-25 LAB — SARS CORONAVIRUS 2 (TAT 6-24 HRS): SARS Coronavirus 2: NEGATIVE

## 2020-11-26 LAB — URINE CULTURE

## 2020-11-27 LAB — CULTURE, GROUP A STREP (THRC)

## 2020-12-03 ENCOUNTER — Other Ambulatory Visit: Payer: Self-pay

## 2020-12-03 ENCOUNTER — Encounter: Payer: Self-pay | Admitting: Emergency Medicine

## 2020-12-03 ENCOUNTER — Ambulatory Visit
Admission: EM | Admit: 2020-12-03 | Discharge: 2020-12-03 | Disposition: A | Discharge status: Home / Self Care | Payer: Managed Care, Other (non HMO) | Attending: Internal Medicine | Admitting: Internal Medicine

## 2020-12-03 DIAGNOSIS — Z1152 Encounter for screening for COVID-19: Secondary | ICD-10-CM

## 2020-12-03 DIAGNOSIS — Z20822 Contact with and (suspected) exposure to covid-19: Secondary | ICD-10-CM | POA: Diagnosis not present

## 2020-12-03 NOTE — ED Provider Notes (Signed)
[name]    CSN: [id number] Arrival date & time: 12/03/20  1642      History   Chief Complaint Chief Complaint  Patient presents with  . Generalized Body Aches    HPI Brittany [name] is a 40 y.o. female who presents with onset of body aches, nose congestion, non productive cough x 3 days. Her coworker tested positive for Covid 4 days ago. Had Covid this past January. Has hx of low vitamin D 09/3019. Has not had Covid injections. Had positive covid test today.     Past Medical History:  Diagnosis Date  . Gallstone   . Headache(784.0)   . Heart murmur   . Hypertension   . Infection    UTI  . Maternal chronic hypertension in first trimester 04/09/2011  . Pregnancy induced hypertension    after 2nd delivery  . Vaginal Pap smear, abnormal    cant remember    Patient Active Problem List   Diagnosis Date Noted  . Sore throat 11/01/2018  . 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 Vitamin D deficiency 12/14/2017  . Murmur, cardiac 11/30/2016  . Anemia 11/30/2016  . Migraine 03/18/2016  . Subclinical hyperthyroidism 12/09/2015  . Chronic hypertension with superimposed preeclampsia 04/09/2011    Past Surgical History:  Procedure Laterality Date  . DILATION AND CURETTAGE OF UTERUS     dont remember year  . LEEP      OB History    Gravida  9   Para  4   Term  2   Preterm  2   AB  5   Living  4     SAB  4   IAB  1   Ectopic  0   Multiple  0   Live Births  4            Home Medications    Prior to Admission medications   Medication Sig Start Date End Date Taking? Authorizing Provider  acetaminophen (TYLENOL) 500 MG tablet Take 500 mg by mouth every 6 (six) hours as needed.    [provider]  amLODipine (NORVASC) 10 MG tablet Take 1 tablet (10 mg total) by mouth daily. 10/13/20   Olive Bass, FNP  etonogestrel (NEXPLANON) 68 MG IMPL implant 1 each by Subdermal route once.    [provider]   Multiple Vitamin (MULTIVITAMIN WITH MINERALS) TABS tablet Take 1 tablet by mouth daily.    [provider]  cetirizine (ZYRTEC ALLERGY) 10 MG tablet Take 1 tablet (10 mg total) by mouth daily. 09/27/20 11/24/20  Rhys Martini, PA-C  ipratropium (ATROVENT) 0.06 % nasal spray Place 2 sprays into both nostrils 4 (four) times daily. 11/21/19 01/15/20  Belinda Fisher, PA-C    Family History Family History  Problem Relation Age of Onset  . Asthma Mother   . Hypertension Mother   . CAD Mother   . Asthma Maternal Grandmother   . Hypertension Maternal Grandmother     Social History Social History   Tobacco Use  . Smoking status: Never Smoker  . Smokeless tobacco: Never Used  Vaping Use  . Vaping Use: Never used  Substance Use Topics  . Alcohol use: No    Alcohol/week: 0.0 standard drinks  . Drug use: No     Allergies   Patient has no known allergies.   Review of Systems Review of Systems  Constitutional: Positive for activity change, appetite change, chills and fatigue. Negative for diaphoresis and fever.  HENT: Positive for congestion, postnasal drip, rhinorrhea and sore throat. Negative for ear discharge, ear pain and trouble swallowing.   Eyes: Negative for discharge.  Respiratory: Positive for cough. Negative for chest tightness and shortness of breath.   Cardiovascular: Negative for chest pain.  Gastrointestinal: Positive for diarrhea. Negative for abdominal pain, nausea and vomiting.       Diarrhea x 1 yestereday  Genitourinary: Negative for difficulty urinating.  Musculoskeletal: Positive for myalgias. Negative for gait problem.  Skin: Negative for rash.  Neurological: Positive for headaches.  Hematological: Negative for adenopathy.     Physical Exam Triage Vital Signs ED Triage Vitals  Enc Vitals Group     BP 12/03/20 1822 135/90     Pulse Rate 12/03/20 1822 86     Resp 12/03/20 1822 18     Temp 12/03/20 1822 98.5 F (36.9 C)     Temp Source 12/03/20 1822  Oral     SpO2 12/03/20 1822 97 %     Weight --      Height --      Head Circumference --      Peak Flow --      Pain Score 12/03/20 1825 5     Pain Loc --      Pain Edu? --      Excl. in GC? --    No data found.  Updated Vital Signs BP 135/90 (BP Location: Left Arm)   Pulse 86   Temp 98.5 F (36.9 C) (Oral)   Resp 18   SpO2 97%   Visual Acuity Right Eye Distance:   Left Eye Distance:   Bilateral Distance:    Right Eye Near:   Left Eye Near:    Bilateral Near:     Physical Exam Physical Exam Vitals signs and nursing note reviewed.  Constitutional:      General: She is not in acute distress.    Appearance: Normal appearance. She is mildly ill-appearing, toxic-appearing or diaphoretic.  HENT:     Head: Normocephalic.     Right Ear: Tympanic membrane, ear canal and external ear normal.     Left Ear: Tympanic membrane, ear canal and external ear normal.     Nose: Nose with moderate cngestion    Mouth/Throat:     Mouth: Mucous membranes are moist.  Eyes:     General: No scleral icterus.       Right eye: No discharge.        Left eye: No discharge.     Conjunctiva/sclera: Conjunctivae normal.  Neck:     Musculoskeletal: Neck supple. No neck rigidity.  Cardiovascular:     Rate and Rhythm: Normal rate and regular rhythm.     Heart sounds: No murmur.  Pulmonary:     Effort: Pulmonary effort is normal.     Breath sounds: Normal breath sounds.    Musculoskeletal: Normal range of motion.  Lymphadenopathy:     Cervical: No cervical adenopathy.  Skin:    General: Skin is warm and dry.     Coloration: Skin is not jaundiced.     Findings: No rash.  Neurological:     Mental Status: She is alert and oriented to person, place, and time.     Gait: Gait normal.  Psychiatric:        Mood and Affect: Mood normal.        Behavior: Behavior normal.        Thought Content: Thought content normal.  Judgment: Judgment normal.     UC Treatments / Results   Labs (all labs ordered are listed, but only abnormal results are displayed) Labs Reviewed  NOVEL CORONAVIRUS, NAA    EKG   Radiology No results found.  Procedures Procedures (including critical care time)  Medications Ordered in UC Medications - No data to display  Initial Impression / Assessment and Plan / UC Course  I have reviewed the triage vital signs and the nursing notes. Covid pending and we will inform her if positive.  I prescribed her Toradol PO as noted for body pain. See instructions.  Final Clinical Impressions(s) / UC Diagnoses   Final diagnoses:  Encounter for screening laboratory testing for COVID-19 virus  Suspected COVID-19 virus infection     Discharge Instructions     If your Covid test ends up positive you may take the following supplements to help your immune system be stronger to fight this viral infection Take Quarcetin 500 mg three times a day x 7 days with Zinc 50 mg ones a day x 7 days. The quarcetin is an antiviral and anti-inflammatory supplement which helps open the zinc channels in the cell to absorb Zinc. Zinc helps decrease the virus load in your body. Take Melatonin 6-10 mg at bed time which also helps support your immune system.  Also make sure to take Vit D 5,000 IU per day with a fatty meal and Vit C 5000 mg a day until you are completely better. To prevent viral illnesses your vitamin D should be between 60-80. Stay on Vitamin D 2,000  and C  1000 mg the rest of the season.  Don't lay around, keep active and walk as much as you are able to to prevent worsening of your symptoms.  Follow up with your family Dr next week.  If you get short of breath and you are able to check  your oxygen with a pulse oxygen meter, if it gets to 92% or less, you need to go to the hospital to be admitted. If you dont have one, come back here and we will assess you.    Your vitamin D needs to be 60-80 to help you with preventing viral illness, so have it  checked     ED Prescriptions    None     PDMP not reviewed this encounter.   Garey Ham, Cordelia Poche 12/03/20 1849

## 2020-12-03 NOTE — ED Triage Notes (Signed)
Pt here for body aches and cough; pt sts positive home covid test

## 2020-12-03 NOTE — Discharge Instructions (Addendum)
If your Covid test ends up positive you may take the following supplements to help your immune system be stronger to fight this viral infection Take Quarcetin 500 mg three times a day x 7 days with Zinc 50 mg ones a day x 7 days. The quarcetin is an antiviral and anti-inflammatory supplement which helps open the zinc channels in the cell to absorb Zinc. Zinc helps decrease the virus load in your body. Take Melatonin 6-10 mg at bed time which also helps support your immune system.  Also make sure to take Vit D 5,000 IU per day with a fatty meal and Vit C 5000 mg a day until you are completely better. To prevent viral illnesses your vitamin D should be between 60-80. Stay on Vitamin D 2,000  and C  1000 mg the rest of the season.  Don't lay around, keep active and walk as much as you are able to to prevent worsening of your symptoms.  Follow up with your family Dr next week.  If you get short of breath and you are able to check  your oxygen with a pulse oxygen meter, if it gets to 92% or less, you need to go to the hospital to be admitted. If you dont have one, come back here and we will assess you.    Your vitamin D needs to be 60-80 to help you with preventing viral illness, so have it checked

## 2020-12-04 ENCOUNTER — Encounter: Payer: Self-pay | Admitting: Family

## 2020-12-04 LAB — SARS-COV-2, NAA 2 DAY TAT

## 2020-12-04 LAB — NOVEL CORONAVIRUS, NAA: SARS-CoV-2, NAA: DETECTED — AB

## 2020-12-08 ENCOUNTER — Other Ambulatory Visit: Payer: Self-pay

## 2020-12-08 ENCOUNTER — Encounter: Payer: Self-pay | Admitting: Family

## 2020-12-08 ENCOUNTER — Telehealth (INDEPENDENT_AMBULATORY_CARE_PROVIDER_SITE_OTHER): Payer: Managed Care, Other (non HMO) | Admitting: Family

## 2020-12-08 VITALS — Ht 65.0 in | Wt 172.0 lb

## 2020-12-08 DIAGNOSIS — U071 COVID-19: Secondary | ICD-10-CM | POA: Diagnosis not present

## 2020-12-08 NOTE — Progress Notes (Signed)
Brittany [name] is a 40 y.o. female with the following history as recorded in [employer]:  Patient Active Problem List   Diagnosis Date Noted  . Sore throat 11/01/2018  . 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 Vitamin D deficiency 12/14/2017  . Murmur, cardiac 11/30/2016  . Anemia 11/30/2016  . Migraine 03/18/2016  . Subclinical hyperthyroidism 12/09/2015  . Chronic hypertension with superimposed preeclampsia 04/09/2011    Current Outpatient Medications  Medication Sig Dispense Refill  . acetaminophen (TYLENOL) 500 MG tablet Take 500 mg by mouth every 6 (six) hours as needed.    Marland Kitchen amLODipine (NORVASC) 10 MG tablet Take 1 tablet (10 mg total) by mouth daily. 30 tablet 1  . etonogestrel (NEXPLANON) 68 MG IMPL implant 1 each by Subdermal route once.    . Multiple Vitamin (MULTIVITAMIN WITH MINERALS) TABS tablet Take 1 tablet by mouth daily.     No current facility-administered medications for this visit.    Allergies: Patient has no known allergies.  Past Medical History:  Diagnosis Date  . Gallstone   . Headache(784.0)   . Heart murmur   . Hypertension   . Infection    UTI  . Maternal chronic hypertension in first trimester 04/09/2011  . Pregnancy induced hypertension    after 2nd delivery  . Vaginal Pap smear, abnormal    cant remember    Past Surgical History:  Procedure Laterality Date  . DILATION AND CURETTAGE OF UTERUS     dont remember year  . LEEP      Family History  Problem Relation Age of Onset  . Asthma Mother   . Hypertension Mother   . CAD Mother   . Asthma Maternal Grandmother   . Hypertension Maternal Grandmother     Social History   Tobacco Use  . Smoking status: Never Smoker  . Smokeless tobacco: Never Used  Substance Use Topics  . Alcohol use: No    Alcohol/week: 0.0 standard drinks    Subjective:   I connected with Mayer Camel on 12/08/20 at 11:00 AM EDT by a telephone call and verified that I am speaking with the correct person  using two identifiers.   I discussed the limitations of evaluation and management by telemedicine and the availability of in person appointments. The patient expressed understanding and agreed to proceed. Provider in office/ patient is at home; provider and patient are only 2 people on telephone call.   Patient was diagnosed with COVID last Thursday; denies any chest pain or shortness of breath; does feel like symptoms improving some but complaining of lingering cough; is breast-feeding- notes that her 93 yo daughter does not take any other type of milk and patient does not feel comfortable to "pump and dump" milk at this time; has been using OTC Robitussin with some relief;      Objective:  Vitals:   12/08/20 1101  Weight: 172 lb (78 kg)  Height: 5\' 5"  (1.651 m)    Lungs: Respirations unlabored;  Neurologic: Alert and oriented; speech intact; face symmetrical;   Assessment:  1. COVID-19     Plan:  Patient is breast-feeding- discussed limited prescriptive options to treat lingering cough; she will continue Robitussin DM and can try adding Claritin 10 mg; increase fluids, rest and follow up worse, no better.   Time spent 10 minutes  No follow-ups on file.  No orders of the defined types were placed in this encounter.   Requested Prescriptions    No prescriptions requested or ordered  in this encounter

## 2021-01-29 ENCOUNTER — Encounter (HOSPITAL_COMMUNITY): Payer: Self-pay

## 2021-01-29 ENCOUNTER — Ambulatory Visit (HOSPITAL_COMMUNITY)
Admission: RE | Admit: 2021-01-29 | Discharge: 2021-01-29 | Disposition: A | Discharge status: Home / Self Care | Payer: Medicaid Other | Source: Ambulatory Visit | Attending: Internal Medicine | Admitting: Internal Medicine

## 2021-01-29 ENCOUNTER — Other Ambulatory Visit: Payer: Self-pay

## 2021-01-29 VITALS — BP 160/105 | HR 76 | Temp 98.5°F | Resp 16

## 2021-01-29 DIAGNOSIS — J029 Acute pharyngitis, unspecified: Secondary | ICD-10-CM

## 2021-01-29 LAB — POCT RAPID STREP A, ED / UC: Streptococcus, Group A Screen (Direct): NEGATIVE

## 2021-01-29 LAB — POC INFLUENZA A AND B ANTIGEN (URGENT CARE ONLY)
INFLUENZA A ANTIGEN, POC: NEGATIVE
INFLUENZA B ANTIGEN, POC: NEGATIVE

## 2021-01-29 NOTE — ED Triage Notes (Signed)
PT reports sore throat and body aches that started today.

## 2021-01-29 NOTE — ED Provider Notes (Signed)
[facility]    CSN: [id number] Arrival date & time: 01/29/21  1732      History   Chief Complaint Chief Complaint  Patient presents with   Sore Throat   Appointment    HPI Brittany [name] is a 40 y.o. female.   HPI  Sore Throat: Patient reports that she started having a sore throat, body aches and chills as of today.  She has a mild headache and a mild cough as well.  She denies any known fevers, ear pain, chest pain or shortness of breath.  She has tried Benadryl and over-the-counter cold medication without much relief.  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   Past Medical History:  Diagnosis Date   Gallstone    Headache(784.0)    Heart murmur    Hypertension    Infection    UTI   Maternal chronic hypertension in first trimester 04/09/2011   Pregnancy induced hypertension    after 2nd delivery   Vaginal Pap smear, abnormal    cant remember    Patient Active Problem List   Diagnosis Date Noted   Sore throat 11/01/2018   Depression affecting pregnancy 03/06/2018   Vitamin D deficiency 12/14/2017   Murmur, cardiac 11/30/2016   Anemia 11/30/2016   Migraine 03/18/2016   Subclinical hyperthyroidism 12/09/2015   Chronic hypertension with superimposed preeclampsia 04/09/2011    Past Surgical History:  Procedure Laterality Date   DILATION AND CURETTAGE OF UTERUS     dont remember year   LEEP      OB History     Gravida  9   Para  4   Term  2   Preterm  2   AB  5   Living  4      SAB  4   IAB  1   Ectopic  0   Multiple  0   Live Births  4            Home Medications    Prior to Admission medications   Medication Sig Start Date End Date Taking? Authorizing Provider  amLODipine (NORVASC) 10 MG tablet Take 1 tablet (10 mg total) by mouth daily. 10/13/20  Yes Olive Bass, FNP  acetaminophen (TYLENOL) 500 MG tablet Take 500 mg by  mouth every 6 (six) hours as needed.    [provider]  etonogestrel (NEXPLANON) 68 MG IMPL implant 1 each by Subdermal route once.    [provider]  Multiple Vitamin (MULTIVITAMIN WITH MINERALS) TABS tablet Take 1 tablet by mouth daily.    [provider]  cetirizine (ZYRTEC ALLERGY) 10 MG tablet Take 1 tablet (10 mg total) by mouth daily. 09/27/20 11/24/20  Rhys Martini, PA-C  ipratropium (ATROVENT) 0.06 % nasal spray Place 2 sprays into both nostrils 4 (four) times daily. 11/21/19 01/15/20  Belinda Fisher, PA-C    Family History Family History  Problem Relation Age of Onset   Asthma Mother    Hypertension Mother    CAD Mother    Asthma Maternal Grandmother    Hypertension Maternal Grandmother     Social History Social History   Tobacco Use   Smoking status: Never   Smokeless tobacco: Never  Vaping Use   Vaping Use: Never used  Substance Use Topics   Alcohol use: No    Alcohol/week: 0.0 standard drinks   Drug use: No  Allergies   Patient has no known allergies.   Review of Systems Review of Systems  As stated above in HPI Physical Exam Triage Vital Signs ED Triage Vitals  Enc Vitals Group     BP 01/29/21 1743 (!) 160/105     Pulse Rate 01/29/21 1743 76     Resp 01/29/21 1743 16     Temp 01/29/21 1743 98.5 F (36.9 C)     Temp Source 01/29/21 1743 Oral     SpO2 01/29/21 1743 98 %     Weight --      Height --      Head Circumference --      Peak Flow --      Pain Score 01/29/21 1742 8     Pain Loc --      Pain Edu? --      Excl. in GC? --    No data found.  Updated Vital Signs BP (!) 160/105   Pulse 76   Temp 98.5 F (36.9 C) (Oral)   Resp 16   SpO2 98%   Physical Exam Vitals and nursing note reviewed.  Constitutional:      General: She is not in acute distress.    Appearance: She is well-developed. She is not ill-appearing, toxic-appearing or diaphoretic.  HENT:     Head: Normocephalic and atraumatic.     Right  Ear: Tympanic membrane normal. No tenderness. No middle ear effusion. Tympanic membrane is not erythematous.     Left Ear: Tympanic membrane normal. No tenderness.  No middle ear effusion. Tympanic membrane is not erythematous.     Nose: No congestion or rhinorrhea.     Mouth/Throat:     Mouth: Mucous membranes are moist.     Pharynx: Oropharynx is clear. Uvula midline. No oropharyngeal exudate, posterior oropharyngeal erythema or uvula swelling.     Tonsils: No tonsillar exudate or tonsillar abscesses.  Eyes:     Conjunctiva/sclera: Conjunctivae normal.     Pupils: Pupils are equal, round, and reactive to light.  Cardiovascular:     Rate and Rhythm: Normal rate and regular rhythm.     Heart sounds: Normal heart sounds.  Pulmonary:     Effort: Pulmonary effort is normal.     Breath sounds: Normal breath sounds.  Musculoskeletal:     Cervical back: Normal range of motion and neck supple.  Skin:    General: Skin is warm.  Neurological:     Mental Status: She is alert and oriented to person, place, and time.     UC Treatments / Results  Labs (all labs ordered are listed, but only abnormal results are displayed) Labs Reviewed - No data to display  EKG   Radiology No results found.  Procedures Procedures (including critical care time)  Medications Ordered in UC Medications - No data to display  Initial Impression / Assessment and Plan / UC Course  I have reviewed the triage vital signs and the nursing notes.  Pertinent labs & imaging results that were available during my care of the patient were reviewed by me and considered in my medical decision making (see chart for details).     New.  Testing for strep pharyngitis, influenza. COVID-19 not indicated as she tested positive 2 months ago. Likely viral in nature. Throat culture for strep pending. For now rest, fluids and hydration with water.  Final Clinical Impressions(s) / UC Diagnoses   Final diagnoses:  None    Discharge Instructions   None  ED Prescriptions   None    PDMP not reviewed this encounter.   Rushie Chestnut, [place] 01/29/21 1829

## 2021-02-08 ENCOUNTER — Ambulatory Visit
Admission: EM | Admit: 2021-02-08 | Discharge: 2021-02-08 | Disposition: A | Discharge status: Home / Self Care | Payer: Medicaid Other | Attending: Physician Assistant | Admitting: Physician Assistant

## 2021-02-08 ENCOUNTER — Other Ambulatory Visit: Payer: Self-pay

## 2021-02-08 DIAGNOSIS — M62830 Muscle spasm of back: Secondary | ICD-10-CM

## 2021-02-08 MED ORDER — KETOROLAC TROMETHAMINE 30 MG/ML IJ SOLN
30.0000 mg | Freq: Once | INTRAMUSCULAR | Status: AC
  Administered 2021-02-08: 30 mg via INTRAMUSCULAR
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
## 2021-02-08 NOTE — Discharge Instructions (Addendum)
Recommend ice to affected areas Light stretching Continue with tylenol as needed.

## 2021-02-11 ENCOUNTER — Ambulatory Visit
Admission: RE | Admit: 2021-02-11 | Discharge: 2021-02-11 | Disposition: A | Discharge status: Home / Self Care | Payer: Medicaid Other | Source: Ambulatory Visit | Attending: Urgent Care | Admitting: Urgent Care

## 2021-02-11 ENCOUNTER — Other Ambulatory Visit: Payer: Self-pay

## 2021-02-11 VITALS — BP 163/93 | HR 85 | Temp 98.4°F | Resp 18

## 2021-02-11 DIAGNOSIS — M549 Dorsalgia, unspecified: Secondary | ICD-10-CM

## 2021-02-11 DIAGNOSIS — M546 Pain in thoracic spine: Secondary | ICD-10-CM

## 2021-02-11 DIAGNOSIS — M545 Low back pain, unspecified: Secondary | ICD-10-CM

## 2021-02-11 MED ORDER — CYCLOBENZAPRINE HCL 10 MG PO TABS: 10.0000 mg | ORAL_TABLET | Freq: Two times a day (BID) | ORAL | 0 refills | Status: DC | PRN

## 2021-02-11 MED ORDER — NAPROXEN 375 MG PO TABS: 375.0000 mg | ORAL_TABLET | Freq: Two times a day (BID) | ORAL | 0 refills | Status: DC
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
## 2021-02-19 ENCOUNTER — Other Ambulatory Visit: Payer: Self-pay | Admitting: Family

## 2021-02-19 ENCOUNTER — Encounter: Payer: Self-pay | Admitting: Family

## 2021-02-19 ENCOUNTER — Other Ambulatory Visit: Payer: Self-pay

## 2021-02-19 ENCOUNTER — Ambulatory Visit (INDEPENDENT_AMBULATORY_CARE_PROVIDER_SITE_OTHER): Payer: 59 | Admitting: Family

## 2021-02-19 VITALS — BP 160/90 | HR 109 | Temp 98.2°F | Ht 65.0 in | Wt 176.0 lb

## 2021-02-19 DIAGNOSIS — M549 Dorsalgia, unspecified: Secondary | ICD-10-CM | POA: Diagnosis not present

## 2021-02-19 DIAGNOSIS — I10 Essential (primary) hypertension: Secondary | ICD-10-CM

## 2021-02-19 MED ORDER — AMLODIPINE BESYLATE 10 MG PO TABS: 10.0000 mg | ORAL_TABLET | Freq: Every day | ORAL | 1 refills | Status: DC

## 2021-02-19 MED ORDER — IBUPROFEN 600 MG PO TABS: 600.0000 mg | ORAL_TABLET | Freq: Four times a day (QID) | ORAL | 0 refills | Status: DC | PRN
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
## 2021-02-26 ENCOUNTER — Other Ambulatory Visit: Payer: Self-pay

## 2021-02-26 ENCOUNTER — Ambulatory Visit
Admission: EM | Admit: 2021-02-26 | Discharge: 2021-02-26 | Disposition: A | Discharge status: Home / Self Care | Payer: 59 | Attending: Internal Medicine | Admitting: Internal Medicine

## 2021-02-26 DIAGNOSIS — R197 Diarrhea, unspecified: Secondary | ICD-10-CM | POA: Diagnosis not present

## 2021-02-26 DIAGNOSIS — R110 Nausea: Secondary | ICD-10-CM

## 2021-02-26 DIAGNOSIS — A059 Bacterial foodborne intoxication, unspecified: Secondary | ICD-10-CM | POA: Diagnosis not present

## 2021-02-26 DIAGNOSIS — R1012 Left upper quadrant pain: Secondary | ICD-10-CM | POA: Diagnosis not present

## 2021-02-26 MED ORDER — ONDANSETRON HCL 4 MG PO TABS: 4.0000 mg | ORAL_TABLET | Freq: Four times a day (QID) | ORAL | 0 refills | Status: DC | PRN

## 2021-02-26 NOTE — Discharge Instructions (Addendum)
You have been prescribed ondansetron as needed for nausea.  It is most likely that you have food poisoning.  This should resolve in the next few days.  Make sure to increase clear oral fluids for hydration.  Go the hospital if symptoms do not improve or worsen in the next few days.  Go to the hospital if you are not able to keep any fluids down.  Blood work is pending.  We will call if it is abnormal.

## 2021-02-26 NOTE — ED Triage Notes (Signed)
Pt c/o abdominal pain states 8/10 dull sensation with associated nausea but no vomiting and diarrhea. States she ate something "bad" from the grocery store this morning before work then experienced symptoms and has tried motrin without relief.

## 2021-02-26 NOTE — ED Provider Notes (Signed)
[name]    CSN: [id number] Arrival date & time: 02/26/21  1755      History   Chief Complaint Chief Complaint  Patient presents with   Abdominal Pain    HPI Brittany [name] is a 40 y.o. female.   Patient presents with abdominal pain, nausea, diarrhea that started this morning after eating some "bad potato rounds".  Denies any vomiting.  Denies any known fevers.  Has been able to keep ginger ale down.   Abdominal Pain  Past Medical History:  Diagnosis Date   Gallstone    Headache(784.0)    Heart murmur    Hypertension    Infection    UTI   Maternal chronic hypertension in first trimester 04/09/2011   Pregnancy induced hypertension    after 2nd delivery   Vaginal Pap smear, abnormal    cant remember    Patient Active Problem List   Diagnosis Date Noted   Sore throat 11/01/2018   Depression affecting pregnancy 03/06/2018   Vitamin D deficiency 12/14/2017   Murmur, cardiac 11/30/2016   Anemia 11/30/2016   Migraine 03/18/2016   Subclinical hyperthyroidism 12/09/2015   Chronic hypertension with superimposed preeclampsia 04/09/2011    Past Surgical History:  Procedure Laterality Date   DILATION AND CURETTAGE OF UTERUS     dont remember year   LEEP      OB History     Gravida  9   Para  4   Term  2   Preterm  2   AB  5   Living  4      SAB  4   IAB  1   Ectopic  0   Multiple  0   Live Births  4            Home Medications    Prior to Admission medications   Medication Sig Start Date End Date Taking? Authorizing Provider  ondansetron (ZOFRAN) 4 MG tablet Take 1 tablet (4 mg total) by mouth every 6 (six) hours as needed for nausea or vomiting. 02/26/21  Yes Lance Muss, FNP  acetaminophen (TYLENOL) 500 MG tablet Take 500 mg by mouth every 6 (six) hours as needed.    [provider]  amLODipine (NORVASC) 10 MG tablet Take 1 tablet (10 mg total) by mouth daily. 02/19/21   Olive Bass, FNP   cyclobenzaprine (FLEXERIL) 10 MG tablet Take 1 tablet (10 mg total) by mouth 2 (two) times daily as needed for muscle spasms. 02/11/21   Wallis Bamberg, PA-C  etonogestrel (NEXPLANON) 68 MG IMPL implant 1 each by Subdermal route once.    [provider]  ibuprofen (ADVIL) 600 MG tablet Take 1 tablet (600 mg total) by mouth every 6 (six) hours as needed. 02/19/21   Olive Bass, FNP  Multiple Vitamin (MULTIVITAMIN WITH MINERALS) TABS tablet Take 1 tablet by mouth daily.    [provider]  cetirizine (ZYRTEC ALLERGY) 10 MG tablet Take 1 tablet (10 mg total) by mouth daily. 09/27/20 11/24/20  Rhys Martini, PA-C  ipratropium (ATROVENT) 0.06 % nasal spray Place 2 sprays into both nostrils 4 (four) times daily. 11/21/19 01/15/20  Belinda Fisher, PA-C    Family History Family History  Problem Relation Age of Onset   Asthma Mother    Hypertension Mother    CAD Mother    Asthma Maternal Grandmother    Hypertension Maternal Grandmother     Social History Social History   Tobacco  Use   Smoking status: Never   Smokeless tobacco: Never  Vaping Use   Vaping Use: Never used  Substance Use Topics   Alcohol use: No    Alcohol/week: 0.0 standard drinks   Drug use: No     Allergies   Patient has no known allergies.   Review of Systems Review of Systems Per HPI  Physical Exam Triage Vital Signs ED Triage Vitals [02/26/21 1831]  Enc Vitals Group     BP (!) 143/98     Pulse Rate 83     Resp 18     Temp 98.7 F (37.1 C)     Temp Source Oral     SpO2 98 %     Weight      Height      Head Circumference      Peak Flow      Pain Score 8     Pain Loc      Pain Edu?      Excl. in GC?    No data found.  Updated Vital Signs BP (!) 143/98 (BP Location: Right Arm)   Pulse 83   Temp 98.7 F (37.1 C) (Oral)   Resp 18   LMP 01/27/2021 (Approximate)   SpO2 98%   Visual Acuity Right Eye Distance:   Left Eye Distance:   Bilateral Distance:    Right Eye Near:    Left Eye Near:    Bilateral Near:     Physical Exam Constitutional:      General: She is not in acute distress.    Appearance: She is not ill-appearing, toxic-appearing or diaphoretic.  Cardiovascular:     Rate and Rhythm: Normal rate and regular rhythm.     Pulses: Normal pulses.     Heart sounds: Normal heart sounds.  Pulmonary:     Effort: Pulmonary effort is normal.     Breath sounds: Normal breath sounds.  Abdominal:     General: Abdomen is flat.     Palpations: Abdomen is soft.     Tenderness: There is abdominal tenderness in the epigastric area and left upper quadrant.  Skin:    General: Skin is warm and dry.  Neurological:     General: No focal deficit present.     Mental Status: She is alert and oriented to person, place, and time. Mental status is at baseline.  Psychiatric:        Mood and Affect: Mood normal.        Behavior: Behavior normal.        Thought Content: Thought content normal.        Judgment: Judgment normal.     UC Treatments / Results  Labs (all labs ordered are listed, but only abnormal results are displayed) Labs Reviewed  AMYLASE  LIPASE    EKG   Radiology No results found.  Procedures Procedures (including critical care time)  Medications Ordered in UC Medications - No data to display  Initial Impression / Assessment and Plan / UC Course  I have reviewed the triage vital signs and the nursing notes.  Pertinent labs & imaging results that were available during my care of the patient were reviewed by me and considered in my medical decision making (see chart for details).     Suspect food poisoning or viral gastroenteritis related to patient's symptoms.  Suspect symptoms should resolve in the next few days.  Patient was prescribed ondansetron to take as needed for nausea.  Patient to increase  clear oral fluid intake to prevent dehydration.  Amylase and lipase ordered due to patient having left upper abdominal pain on exam.   Advised patient to go to the hospital if symptoms worsen.Discussed strict return precautions. Patient verbalized understanding and is agreeable with plan.  Final Clinical Impressions(s) / UC Diagnoses   Final diagnoses:  Food poisoning  Nausea without vomiting  Diarrhea, unspecified type  Abdominal pain, left upper quadrant     Discharge Instructions      You have been prescribed ondansetron as needed for nausea.  It is most likely that you have food poisoning.  This should resolve in the next few days.  Make sure to increase clear oral fluids for hydration.  Go the hospital if symptoms do not improve or worsen in the next few days.  Go to the hospital if you are not able to keep any fluids down.  Blood work is pending.  We will call if it is abnormal.      ED Prescriptions     Medication Sig Dispense Auth. Provider   ondansetron (ZOFRAN) 4 MG tablet Take 1 tablet (4 mg total) by mouth every 6 (six) hours as needed for nausea or vomiting. 12 tablet Lance Muss, FNP      PDMP not reviewed this encounter.   Lance Muss, FNP 02/26/21 2031

## 2021-02-28 LAB — AMYLASE: Amylase: 59 U/L (ref 31–110)

## 2021-02-28 LAB — LIPASE: Lipase: 36 U/L (ref 14–72)

## 2021-03-09 ENCOUNTER — Encounter: Payer: Self-pay | Admitting: Family

## 2021-03-09 ENCOUNTER — Other Ambulatory Visit: Payer: Self-pay | Admitting: Family

## 2021-03-09 DIAGNOSIS — M545 Low back pain, unspecified: Secondary | ICD-10-CM

## 2021-03-15 ENCOUNTER — Encounter: Payer: Self-pay | Admitting: Family
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
## 2021-03-16 NOTE — Telephone Encounter (Signed)
Pt is wanting form completed, however we did not see her for that concern. She did come to [facility] and we can see the note. Please assist, what do you suggest?

## 2021-03-19 ENCOUNTER — Telehealth: Payer: Self-pay

## 2021-03-19 NOTE — Telephone Encounter (Signed)
A Statement of Physician forms was received via [employer] from pt for PCP to complete.  Form was placed in PCP's box for pick up and completion.

## 2021-03-22 ENCOUNTER — Other Ambulatory Visit: Payer: Self-pay

## 2021-03-22 ENCOUNTER — Ambulatory Visit
Admission: RE | Admit: 2021-03-22 | Discharge: 2021-03-22 | Disposition: A | Discharge status: Home / Self Care | Payer: 59 | Source: Ambulatory Visit | Attending: Internal Medicine | Admitting: Internal Medicine

## 2021-03-22 VITALS — BP 147/78 | HR 76 | Temp 98.7°F | Resp 18 | Ht 65.0 in | Wt 165.0 lb

## 2021-03-22 DIAGNOSIS — R350 Frequency of micturition: Secondary | ICD-10-CM | POA: Insufficient documentation

## 2021-03-22 DIAGNOSIS — R300 Dysuria: Secondary | ICD-10-CM | POA: Diagnosis not present

## 2021-03-22 DIAGNOSIS — Z113 Encounter for screening for infections with a predominantly sexual mode of transmission: Secondary | ICD-10-CM | POA: Insufficient documentation

## 2021-03-22 DIAGNOSIS — N390 Urinary tract infection, site not specified: Secondary | ICD-10-CM | POA: Insufficient documentation

## 2021-03-22 LAB — POCT URINALYSIS DIP (MANUAL ENTRY)
Bilirubin, UA: NEGATIVE
Blood, UA: NEGATIVE
Glucose, UA: NEGATIVE mg/dL
Ketones, POC UA: NEGATIVE mg/dL
Leukocytes, UA: NEGATIVE
Nitrite, UA: NEGATIVE
Protein Ur, POC: NEGATIVE mg/dL
Spec Grav, UA: 1.02 (ref 1.010–1.025)
Urobilinogen, UA: 0.2 E.U./dL
pH, UA: 8 (ref 5.0–8.0)

## 2021-03-22 LAB — POCT URINE PREGNANCY: Preg Test, Ur: NEGATIVE

## 2021-03-22 MED ORDER — CEPHALEXIN 250 MG PO CAPS: 250.0000 mg | ORAL_CAPSULE | Freq: Four times a day (QID) | ORAL | 0 refills | Status: AC

## 2021-03-22 NOTE — ED Triage Notes (Signed)
Patient c/o possible UTI, low back pain, urgency, frequency, some dysuria x 2 days, no hematuria.

## 2021-03-22 NOTE — Discharge Instructions (Addendum)
Your urine did not show signs of urinary tract infection but will treat with antibiotic due to symptoms.  Your vaginal swab and urine culture are pending.  We will call if these are positive.

## 2021-03-22 NOTE — ED Provider Notes (Signed)
[name]    CSN: [id number] Arrival date & time: 03/22/21  3329      History   Chief Complaint Chief Complaint  Patient presents with   Appointment    Possible UTI    HPI Brittany [name] is a 40 y.o. female.   Patient presents with low back pain, urinary urgency, urinary frequency, urinary burning that has been present for 2 days.  Denies pelvic pain, hematuria, vaginal discharge, fever but does have some mild lower abdominal pain and right lower back pain.  Denies any known exposure to STD but has had unprotected sexual intercourse recently.  No concern for pregnancy per patient.  Patient is currently breast-feeding her child.    Past Medical History:  Diagnosis Date   Gallstone    Headache(784.0)    Heart murmur    Hypertension    Infection    UTI   Maternal chronic hypertension in first trimester 04/09/2011   Pregnancy induced hypertension    after 2nd delivery   Vaginal Pap smear, abnormal    cant remember    Patient Active Problem List   Diagnosis Date Noted   Sore throat 11/01/2018   Depression affecting pregnancy 03/06/2018   Vitamin D deficiency 12/14/2017   Murmur, cardiac 11/30/2016   Anemia 11/30/2016   Migraine 03/18/2016   Subclinical hyperthyroidism 12/09/2015   Chronic hypertension with superimposed preeclampsia 04/09/2011    Past Surgical History:  Procedure Laterality Date   DILATION AND CURETTAGE OF UTERUS     dont remember year   LEEP      OB History     Gravida  9   Para  4   Term  2   Preterm  2   AB  5   Living  4      SAB  4   IAB  1   Ectopic  0   Multiple  0   Live Births  4            Home Medications    Prior to Admission medications   Medication Sig Start Date End Date Taking? Authorizing Provider  cephALEXin (KEFLEX) 250 MG capsule Take 1 capsule (250 mg total) by mouth 4 (four) times daily for 5 days. 03/22/21 03/27/21 Yes Lance Muss, FNP  acetaminophen (TYLENOL) 500 MG tablet  Take 500 mg by mouth every 6 (six) hours as needed.    [provider]  amLODipine (NORVASC) 10 MG tablet Take 1 tablet (10 mg total) by mouth daily. 02/19/21   Olive Bass, FNP  cyclobenzaprine (FLEXERIL) 10 MG tablet Take 1 tablet (10 mg total) by mouth 2 (two) times daily as needed for muscle spasms. 02/11/21   Wallis Bamberg, PA-C  etonogestrel (NEXPLANON) 68 MG IMPL implant 1 each by Subdermal route once.    [provider]  ibuprofen (ADVIL) 600 MG tablet Take 1 tablet (600 mg total) by mouth every 6 (six) hours as needed. 02/19/21   Olive Bass, FNP  Multiple Vitamin (MULTIVITAMIN WITH MINERALS) TABS tablet Take 1 tablet by mouth daily.    [provider]  ondansetron (ZOFRAN) 4 MG tablet Take 1 tablet (4 mg total) by mouth every 6 (six) hours as needed for nausea or vomiting. 02/26/21   Lance Muss, FNP  cetirizine (ZYRTEC ALLERGY) 10 MG tablet Take 1 tablet (10 mg total) by mouth daily. 09/27/20 11/24/20  Rhys Martini, PA-C  ipratropium (ATROVENT) 0.06 % nasal spray Place 2 sprays into  both nostrils 4 (four) times daily. 11/21/19 01/15/20  Belinda Fisher, PA-C    Family History Family History  Problem Relation Age of Onset   Asthma Mother    Hypertension Mother    CAD Mother    Asthma Maternal Grandmother    Hypertension Maternal Grandmother     Social History Social History   Tobacco Use   Smoking status: Never   Smokeless tobacco: Never  Vaping Use   Vaping Use: Never used  Substance Use Topics   Alcohol use: No    Alcohol/week: 0.0 standard drinks   Drug use: No     Allergies   Patient has no known allergies.   Review of Systems Review of Systems Per HPI  Physical Exam Triage Vital Signs ED Triage Vitals [03/22/21 1015]  Enc Vitals Group     BP (!) 147/78     Pulse Rate 76     Resp 18     Temp 98.7 F (37.1 C)     Temp Source Oral     SpO2 98 %     Weight 165 lb (74.8 kg)     Height 5\' 5"  (1.651 m)     Head  Circumference      Peak Flow      Pain Score 8     Pain Loc      Pain Edu?      Excl. in GC?    No data found.  Updated Vital Signs BP (!) 147/78 (BP Location: Left Arm)   Pulse 76   Temp 98.7 F (37.1 C) (Oral)   Resp 18   Ht 5\' 5"  (1.651 m)   Wt 165 lb (74.8 kg)   SpO2 98%   Breastfeeding Yes   BMI 27.46 kg/m   Visual Acuity Right Eye Distance:   Left Eye Distance:   Bilateral Distance:    Right Eye Near:   Left Eye Near:    Bilateral Near:     Physical Exam Constitutional:      Appearance: Normal appearance.  HENT:     Head: Normocephalic and atraumatic.  Eyes:     Extraocular Movements: Extraocular movements intact.     Conjunctiva/sclera: Conjunctivae normal.  Cardiovascular:     Rate and Rhythm: Normal rate and regular rhythm.     Pulses: Normal pulses.     Heart sounds: Normal heart sounds.  Pulmonary:     Effort: Pulmonary effort is normal.     Breath sounds: Normal breath sounds.  Abdominal:     General: Bowel sounds are normal. There is no distension.     Palpations: Abdomen is soft.     Tenderness: There is no abdominal tenderness.  Genitourinary:    Comments: Deferred with shared decision making.  Self swab performed. Musculoskeletal:     Cervical back: Normal.     Thoracic back: Normal.     Lumbar back: Normal.  Skin:    General: Skin is warm and dry.  Neurological:     General: No focal deficit present.     Mental Status: She is alert and oriented to person, place, and time. Mental status is at baseline.  Psychiatric:        Mood and Affect: Mood normal.        Behavior: Behavior normal.        Thought Content: Thought content normal.        Judgment: Judgment normal.     UC Treatments / Results  Labs (all  labs ordered are listed, but only abnormal results are displayed) Labs Reviewed  URINE CULTURE  POCT URINALYSIS DIP (MANUAL ENTRY)  POCT URINE PREGNANCY  CERVICOVAGINAL ANCILLARY ONLY    EKG   Radiology No results  found.  Procedures Procedures (including critical care time)  Medications Ordered in UC Medications - No data to display  Initial Impression / Assessment and Plan / UC Course  I have reviewed the triage vital signs and the nursing notes.  Pertinent labs & imaging results that were available during my care of the patient were reviewed by me and considered in my medical decision making (see chart for details).     Urinalysis did not show signs of urinary tract infection, patient is having typical UTI symptoms.  Will treat with cephalexin x5 days due to patient currently breast-feeding.  Urine pregnancy negative.  Urine culture and cervicovaginal swab are pending. Will change treatment if appropriate once results are complete.Discussed strict return precautions. Patient verbalized understanding and is agreeable with plan.  Final Clinical Impressions(s) / UC Diagnoses   Final diagnoses:  Dysuria  Lower urinary tract infection  Screening examination for venereal disease  Urinary frequency     Discharge Instructions      Your urine did not show signs of urinary tract infection but will treat with antibiotic due to symptoms.  Your vaginal swab and urine culture are pending.  We will call if these are positive.     ED Prescriptions     Medication Sig Dispense Auth. Provider   cephALEXin (KEFLEX) 250 MG capsule Take 1 capsule (250 mg total) by mouth 4 (four) times daily for 5 days. 20 capsule Lance Muss, FNP      PDMP not reviewed this encounter.   Lance Muss, FNP 03/22/21 1056

## 2021-03-23 DIAGNOSIS — Z0279 Encounter for issue of other medical certificate: Secondary | ICD-10-CM

## 2021-03-23 NOTE — Telephone Encounter (Signed)
I have attempted to call pt once again, however the voice mail is full and so I was unable to leave a message.

## 2021-03-23 NOTE — Telephone Encounter (Signed)
Form has been received and given to provider for review and to complete if appropriate.

## 2021-03-23 NOTE — Telephone Encounter (Signed)
I have called pt and there was no answer and the mail box was full. I have placed a copy of the completed form in scan and emailed a copy to her e-mail.  Plan: to tell pt the form has been completed to the best of our ability, especially since we did not see her. We can not answer any questions about the insurance side of things.

## 2021-03-24 LAB — CERVICOVAGINAL ANCILLARY ONLY
Bacterial Vaginitis (gardnerella): NEGATIVE
Candida Glabrata: NEGATIVE
Candida Vaginitis: NEGATIVE
Chlamydia: NEGATIVE
Comment: NEGATIVE
Comment: NEGATIVE
Comment: NEGATIVE
Comment: NEGATIVE
Comment: NEGATIVE
Comment: NORMAL
Neisseria Gonorrhea: NEGATIVE
Trichomonas: NEGATIVE

## 2021-03-24 LAB — URINE CULTURE: Culture: <10000 — AB

## 2021-04-09 NOTE — Progress Notes (Signed)
Patient called multiple times by nurse to inquire about symptoms. No answer. No return call.

## 2021-04-26 ENCOUNTER — Other Ambulatory Visit: Payer: Self-pay | Admitting: Family

## 2021-05-10 ENCOUNTER — Encounter: Payer: Self-pay | Admitting: Family

## 2021-06-25 ENCOUNTER — Encounter: Payer: 59 | Admitting: Family

## 2021-07-23 ENCOUNTER — Other Ambulatory Visit: Payer: Self-pay | Admitting: Family

## 2021-07-30 ENCOUNTER — Ambulatory Visit (INDEPENDENT_AMBULATORY_CARE_PROVIDER_SITE_OTHER): Payer: Medicaid Other | Admitting: Family

## 2021-07-30 ENCOUNTER — Encounter: Payer: Self-pay | Admitting: Family

## 2021-07-30 VITALS — BP 138/80 | HR 70 | Temp 98.4°F | Ht 65.0 in | Wt 172.0 lb

## 2021-07-30 DIAGNOSIS — Z13220 Encounter for screening for lipoid disorders: Secondary | ICD-10-CM

## 2021-07-30 DIAGNOSIS — Z0000 Encounter for general adult medical examination without abnormal findings: Secondary | ICD-10-CM | POA: Diagnosis not present

## 2021-07-30 DIAGNOSIS — E049 Nontoxic goiter, unspecified: Secondary | ICD-10-CM

## 2021-07-30 LAB — COMPREHENSIVE METABOLIC PANEL
ALT: 16 U/L (ref 0–35)
AST: 20 U/L (ref 0–37)
Albumin: 4.6 g/dL (ref 3.5–5.2)
Alkaline Phosphatase: 47 U/L (ref 39–117)
BUN: 15 mg/dL (ref 6–23)
CO2: 28 mEq/L (ref 19–32)
Calcium: 9.1 mg/dL (ref 8.4–10.5)
Chloride: 102 mEq/L (ref 96–112)
Creatinine, Ser: 0.73 mg/dL (ref 0.40–1.20)
GFR: 104.34 mL/min (ref >60.00)
Glucose, Bld: 77 mg/dL (ref 70–99)
Potassium: 4.2 mEq/L (ref 3.5–5.1)
Sodium: 138 mEq/L (ref 135–145)
Total Bilirubin: 0.6 mg/dL (ref 0.2–1.2)
Total Protein: 7.9 g/dL (ref 6.0–8.3)

## 2021-07-30 LAB — CBC WITH DIFFERENTIAL/PLATELET
Basophils Absolute: 0.1 10*3/uL (ref 0.0–0.1)
Basophils Relative: 1.1 % (ref 0.0–3.0)
Eosinophils Absolute: 0.1 10*3/uL (ref 0.0–0.7)
Eosinophils Relative: 1.7 % (ref 0.0–5.0)
HCT: 38.2 % (ref 36.0–46.0)
Hemoglobin: 12.3 g/dL (ref 12.0–15.0)
Lymphocytes Relative: 36.9 % (ref 12.0–46.0)
Lymphs Abs: 2.3 10*3/uL (ref 0.7–4.0)
MCHC: 32.2 g/dL (ref 30.0–36.0)
MCV: 85.2 fl (ref 78.0–100.0)
Monocytes Absolute: 0.5 10*3/uL (ref 0.1–1.0)
Monocytes Relative: 7.6 % (ref 3.0–12.0)
Neutro Abs: 3.3 10*3/uL (ref 1.4–7.7)
Neutrophils Relative %: 52.7 % (ref 43.0–77.0)
Platelets: 302 10*3/uL (ref 150.0–400.0)
RBC: 4.48 Mil/uL (ref 3.87–5.11)
RDW: 13.1 % (ref 11.5–15.5)
WBC: 6.3 10*3/uL (ref 4.0–10.5)

## 2021-07-30 LAB — LIPID PANEL
Cholesterol: 138 mg/dL (ref 0–200)
HDL: 71.2 mg/dL (ref >39.00)
LDL Cholesterol: 61 mg/dL (ref 0–99)
NonHDL: 66.43
Total CHOL/HDL Ratio: 2
Triglycerides: 28 mg/dL (ref 0.0–149.0)
VLDL: 5.6 mg/dL (ref 0.0–40.0)

## 2021-07-30 LAB — TSH: TSH: 0.43 u[IU]/mL (ref 0.35–5.50)

## 2021-07-30 MED ORDER — AMLODIPINE BESYLATE 10 MG PO TABS: ORAL_TABLET | ORAL | 3 refills | Status: DC

## 2021-07-30 NOTE — Progress Notes (Signed)
Brittany [name] is a 41 y.o. female with the following history as recorded in [employer]:  Patient Active Problem List   Diagnosis Date Noted   Sore throat 11/01/2018   Depression affecting pregnancy 03/06/2018   Vitamin D deficiency 12/14/2017   Murmur, cardiac 11/30/2016   Anemia 11/30/2016   Migraine 03/18/2016   Subclinical hyperthyroidism 12/09/2015   Chronic hypertension with superimposed preeclampsia 04/09/2011    Current Outpatient Medications  Medication Sig Dispense Refill   acetaminophen (TYLENOL) 500 MG tablet Take 500 mg by mouth every 6 (six) hours as needed.     cyclobenzaprine (FLEXERIL) 10 MG tablet Take 1 tablet (10 mg total) by mouth 2 (two) times daily as needed for muscle spasms. 20 tablet 0   etonogestrel (NEXPLANON) 68 MG IMPL implant 1 each by Subdermal route once.     ibuprofen (ADVIL) 600 MG tablet Take 1 tablet (600 mg total) by mouth every 6 (six) hours as needed. 40 tablet 0   Multiple Vitamin (MULTIVITAMIN WITH MINERALS) TABS tablet Take 1 tablet by mouth daily.     ondansetron (ZOFRAN) 4 MG tablet Take 1 tablet (4 mg total) by mouth every 6 (six) hours as needed for nausea or vomiting. 12 tablet 0   amLODipine (NORVASC) 10 MG tablet TAKE 1 TABLET(10 MG) BY MOUTH DAILY 90 tablet 3   No current facility-administered medications for this visit.    Allergies: Patient has no known allergies.  Past Medical History:  Diagnosis Date   Gallstone    Headache(784.0)    Heart murmur    Hypertension    Infection    UTI   Maternal chronic hypertension in first trimester 04/09/2011   Pregnancy induced hypertension    after 2nd delivery   Vaginal Pap smear, abnormal    cant remember    Past Surgical History:  Procedure Laterality Date   DILATION AND CURETTAGE OF UTERUS     dont remember year   LEEP      Family History  Problem Relation Age of Onset   Asthma Mother    Hypertension Mother    CAD Mother    Asthma Maternal Grandmother    Hypertension  Maternal Grandmother     Social History   Tobacco Use   Smoking status: Never   Smokeless tobacco: Never  Substance Use Topics   Alcohol use: No    Alcohol/week: 0.0 standard drinks    Subjective:  Presents for yearly CPE; considering to get pregnant again- is having Nexplanon removed in next 2 weeks;  Is working 50-60 hours/ week;  3 older kids are participating in virtual school; husband has severe asthma- on disability due to asthma; Overdue to see dentist; up to date on eye exam;   LMP- Nexplanon; notes that bleeding is irregular; is still breast-feeding ( 60 yo child) Last pap smear 07/2019;   Review of Systems  Constitutional: Negative.   HENT: Negative.    Eyes: Negative.   Respiratory: Negative.    Cardiovascular: Negative.   Gastrointestinal: Negative.   Genitourinary: Negative.   Musculoskeletal: Negative.   Skin: Negative.   Neurological: Negative.   Endo/Heme/Allergies: Negative.   Psychiatric/Behavioral: Negative.       Objective:  Vitals:   07/30/21 1039  BP: 138/80  Pulse: 70  Temp: 98.4 F (36.9 C)  TempSrc: Oral  SpO2: 97%  Weight: 172 lb (78 kg)  Height: 5' 5" (1.651 m)    General: Well developed, well nourished, in no acute distress  Skin :  Warm and dry.  °Head: Normocephalic and atraumatic  °Eyes: Sclera and conjunctiva clear; pupils round and reactive to light; extraocular movements intact  °Ears: External normal; canals clear; tympanic membranes normal  °Oropharynx: Pink, supple. No suspicious lesions  °Neck: Supple without thyromegaly, adenopathy  °Lungs: Respirations unlabored; clear to auscultation bilaterally without wheeze, rales, rhonchi  °CVS exam: normal rate and regular rhythm.  °Abdomen: Soft; nontender; nondistended; normoactive bowel sounds; no masses or hepatosplenomegaly  °Musculoskeletal: No deformities; no active joint inflammation  °Extremities: No edema, cyanosis, clubbing  °Vessels: Symmetric bilaterally  °Neurologic: Alert and  oriented; speech intact; face symmetrical; moves all extremities well; CNII-XII intact without focal deficit  °Assessment:  °1. PE (physical exam), annual   °2. Lipid screening   °3. Thyroid enlarged   °  °Plan:  °Age appropriate preventive healthcare needs addressed; encouraged regular eye doctor and dental exams; encouraged regular exercise; will update labs and refills as needed today; follow-up to be determined; °Keep planned follow up for Nexplanon removal; °Thyroid ultrasound ordered;  ° °This visit occurred during the SARS-CoV-2 public health emergency.  Safety protocols were in place, including screening questions prior to the visit, additional usage of staff PPE, and extensive cleaning of exam room while observing appropriate contact time as indicated for disinfecting solutions.  ° ° °No follow-ups on file.  °Orders Placed This Encounter  °Procedures  ° US THYROID  °  Standing Status:   Future  °  Standing Expiration Date:   07/30/2022  °  Order Specific Question:   Reason for Exam (SYMPTOM  OR DIAGNOSIS REQUIRED)  °  Answer:   enlarged thyroid  °  Order Specific Question:   Preferred imaging location?  °  Answer:   [facility]  ° CBC with Differential/Platelet  ° Comp Met (CMET)  ° Lipid panel  ° TSH  °  °Requested Prescriptions  ° °Signed Prescriptions Disp Refills  ° amLODipine (NORVASC) 10 MG tablet 90 tablet 3  °  Sig: TAKE 1 TABLET(10 MG) BY MOUTH DAILY  °  ° °

## 2021-08-09 ENCOUNTER — Encounter: Payer: Self-pay | Admitting: Family Medicine

## 2021-08-17 ENCOUNTER — Encounter: Payer: Self-pay | Admitting: Family Medicine

## 2021-08-17 ENCOUNTER — Ambulatory Visit: Payer: Medicaid Other | Admitting: Family Medicine

## 2021-08-17 DIAGNOSIS — Z3202 Encounter for pregnancy test, result negative: Secondary | ICD-10-CM

## 2021-08-17 DIAGNOSIS — Z3009 Encounter for other general counseling and advice on contraception: Secondary | ICD-10-CM

## 2021-08-17 DIAGNOSIS — Z3046 Encounter for surveillance of implantable subdermal contraceptive: Secondary | ICD-10-CM | POA: Diagnosis not present

## 2021-08-17 LAB — POCT PREGNANCY, URINE

## 2021-08-17 MED ORDER — ETONOGESTREL 68 MG ~~LOC~~ IMPL
68.0000 mg | DRUG_IMPLANT | Freq: Once | SUBCUTANEOUS | Status: AC
  Administered 2021-08-17: 68 mg via SUBCUTANEOUS

## 2021-08-17 NOTE — Progress Notes (Signed)
Chief Complaint  Patient presents with   Procedure   Pt here for Nexplanon removal and reinsertion. This will be her 3rd implant placed today. She has 4 kids and desiring no more at this time. Started having a cycle today. Does not remember when her last implant was inserted.   Psych: Age appropriate judgment and insight  Procedure note; Nexplanon removal Informed consent obtained. The patient was placed in the supine position with her left arm abducted and external rotated, exposing the medial surface of her arm. The Nexplanon was palpated and the distal edge was elevated with pressure to the proximal end. Measurements were made, and markings were placed at 8 cm, 10 cm, 12 cm and 14 cm from the medial epicondyle of the humerus.  The area was prepped with alcohol and a 27 gauge needle was used to inject 3 mL of 1% lidocaine with epinephrine.  While pressure was applied to the proximal side of the trocar, an 11-blade scalpel was used to make a small incision over the area, parallel to the trocar. The trocar was visualized and scar tissue was dissected around it. It was grasped with a straight hemostat and removed.   Procedure Note, Nexplanon placement: Informed consent obtained as above. The Nexplanon trocar was then inserted at the end of the open area and pushed gently through the subcutaneous tissue along the line of the sulcus.  The seal was broken and the implant was held in place while the trocar was withdrawn.  The implant was then palpated by both the patient and me.  The arm was cleansed and the puncture site from the trocar covered with a pressure dressing.  Hemostasis was observed at the site. There were no complications noted.  The patient tolerated the procedure well.  She was instructed that the device must be removed in three years.  Birth control counseling - Plan: POCT Pregnancy, Urine, etonogestrel (NEXPLANON) implant 68 mg  Encounter for removal and reinsertion of  Nexplanon  Aftercare instructions verbalized and written down. Warning signs and symptoms verbalized and written down in AVS. Back up contraception for at least 7 d. Neg preg test today. F/u prn. Pt voiced understanding and agreement to the plan.  Crosby Oyster Jawanna Dykman 7:56 AM 08/17/21

## 2021-08-17 NOTE — Patient Instructions (Signed)
Please use back up contraception for 7 days.   Ice/cold pack over area for 10-15 min twice daily.  OK to take Tylenol 1000 mg (2 extra strength tabs) or 975 mg (3 regular strength tabs) every 6 hours as needed.  Ibuprofen 400-600 mg (2-3 over the counter strength tabs) every 6 hours as needed for pain.  Do not shower for the rest of the day. When you do wash it, use only soap and water. Do not vigorously scrub. Apply triple antibiotic ointment (like Neosporin) twice daily. Keep the area clean and dry.   Things to look out for: increasing pain not relieved by ibuprofen/acetaminophen, fevers, spreading redness, drainage of pus, or foul odor.  Let us know if you need anything.

## 2021-08-18 ENCOUNTER — Encounter: Payer: Self-pay | Admitting: Family Medicine

## 2021-08-18 ENCOUNTER — Other Ambulatory Visit: Payer: Self-pay | Admitting: Family Medicine

## 2021-08-18 MED ORDER — TRAMADOL HCL 50 MG PO TABS: 50.0000 mg | ORAL_TABLET | Freq: Three times a day (TID) | ORAL | 0 refills | Status: AC | PRN

## 2021-09-15 ENCOUNTER — Other Ambulatory Visit: Payer: Medicaid Other

## 2021-09-17 ENCOUNTER — Other Ambulatory Visit: Payer: Self-pay | Admitting: Family

## 2021-09-17 ENCOUNTER — Other Ambulatory Visit: Payer: Medicaid Other

## 2021-09-17 ENCOUNTER — Ambulatory Visit
Admission: RE | Admit: 2021-09-17 | Discharge: 2021-09-17 | Disposition: A | Discharge status: Home / Self Care | Payer: Medicaid Other | Source: Ambulatory Visit | Attending: Family | Admitting: Family

## 2021-09-17 DIAGNOSIS — E010 Iodine-deficiency related diffuse (endemic) goiter: Secondary | ICD-10-CM

## 2021-09-17 DIAGNOSIS — E049 Nontoxic goiter, unspecified: Secondary | ICD-10-CM

## 2021-09-23 ENCOUNTER — Emergency Department (HOSPITAL_COMMUNITY)
Admission: EM | Admit: 2021-09-23 | Discharge: 2021-09-23 | Disposition: A | Discharge status: Home / Self Care | Payer: Medicaid Other | Attending: Emergency Medicine | Admitting: Emergency Medicine

## 2021-09-23 ENCOUNTER — Encounter (HOSPITAL_COMMUNITY): Payer: Self-pay

## 2021-09-23 ENCOUNTER — Other Ambulatory Visit: Payer: Self-pay

## 2021-09-23 ENCOUNTER — Emergency Department (HOSPITAL_COMMUNITY): Payer: Medicaid Other

## 2021-09-23 ENCOUNTER — Encounter: Payer: Self-pay | Admitting: Family

## 2021-09-23 ENCOUNTER — Telehealth: Payer: Self-pay | Admitting: Family

## 2021-09-23 DIAGNOSIS — R109 Unspecified abdominal pain: Secondary | ICD-10-CM | POA: Diagnosis present

## 2021-09-23 DIAGNOSIS — I10 Essential (primary) hypertension: Secondary | ICD-10-CM | POA: Insufficient documentation

## 2021-09-23 DIAGNOSIS — N201 Calculus of ureter: Secondary | ICD-10-CM | POA: Diagnosis not present

## 2021-09-23 DIAGNOSIS — Z79899 Other long term (current) drug therapy: Secondary | ICD-10-CM | POA: Diagnosis not present

## 2021-09-23 DIAGNOSIS — N200 Calculus of kidney: Secondary | ICD-10-CM

## 2021-09-23 LAB — I-STAT BETA HCG BLOOD, ED (MC, WL, AP ONLY): I-stat hCG, quantitative: <5 m[IU]/mL (ref <5)

## 2021-09-23 LAB — BASIC METABOLIC PANEL
Anion gap: 8 (ref 5–15)
BUN: 13 mg/dL (ref 6–20)
CO2: 25 mmol/L (ref 22–32)
Calcium: 9 mg/dL (ref 8.9–10.3)
Chloride: 104 mmol/L (ref 98–111)
Creatinine, Ser: 0.82 mg/dL (ref 0.44–1.00)
GFR, Estimated: >60 mL/min (ref >60)
Glucose, Bld: 105 mg/dL — ABNORMAL HIGH (ref 70–99)
Potassium: 3.9 mmol/L (ref 3.5–5.1)
Sodium: 137 mmol/L (ref 135–145)

## 2021-09-23 LAB — URINALYSIS, ROUTINE W REFLEX MICROSCOPIC
Bilirubin Urine: NEGATIVE
Glucose, UA: NEGATIVE mg/dL
Ketones, ur: NEGATIVE mg/dL
Leukocytes,Ua: NEGATIVE
Nitrite: NEGATIVE
Protein, ur: NEGATIVE mg/dL
Specific Gravity, Urine: 1.005 (ref 1.005–1.030)
pH: 8 (ref 5.0–8.0)

## 2021-09-23 LAB — HEPATIC FUNCTION PANEL
ALT: 21 U/L (ref 0–44)
AST: 29 U/L (ref 15–41)
Albumin: 4.4 g/dL (ref 3.5–5.0)
Alkaline Phosphatase: 46 U/L (ref 38–126)
Bilirubin, Direct: 0.2 mg/dL (ref 0.0–0.2)
Indirect Bilirubin: 0.4 mg/dL (ref 0.3–0.9)
Total Bilirubin: 0.6 mg/dL (ref 0.3–1.2)
Total Protein: 9 g/dL — ABNORMAL HIGH (ref 6.5–8.1)

## 2021-09-23 LAB — CBC
HCT: 39.8 % (ref 36.0–46.0)
Hemoglobin: 13.2 g/dL (ref 12.0–15.0)
MCH: 28.6 pg (ref 26.0–34.0)
MCHC: 33.2 g/dL (ref 30.0–36.0)
MCV: 86.3 fL (ref 80.0–100.0)
Platelets: 268 10*3/uL (ref 150–400)
RBC: 4.61 MIL/uL (ref 3.87–5.11)
RDW: 13.5 % (ref 11.5–15.5)
WBC: 9.8 10*3/uL (ref 4.0–10.5)
nRBC: 0 % (ref 0.0–0.2)

## 2021-09-23 LAB — LIPASE, BLOOD: Lipase: 48 U/L (ref 11–51)

## 2021-09-23 MED ORDER — IBUPROFEN 600 MG PO TABS: 600.0000 mg | ORAL_TABLET | Freq: Four times a day (QID) | ORAL | 0 refills | Status: DC | PRN

## 2021-09-23 MED ORDER — ONDANSETRON 4 MG PO TBDP: 4.0000 mg | ORAL_TABLET | Freq: Three times a day (TID) | ORAL | 0 refills | Status: AC | PRN

## 2021-09-23 MED ORDER — KETOROLAC TROMETHAMINE 15 MG/ML IJ SOLN
15.0000 mg | Freq: Once | INTRAMUSCULAR | Status: AC
  Administered 2021-09-23: 03:00:00 15 mg via INTRAVENOUS
  Filled 2021-09-23: qty 1

## 2021-09-23 MED ORDER — SODIUM CHLORIDE 0.9 % IV BOLUS
1000.0000 mL | Freq: Once | INTRAVENOUS | Status: AC
  Administered 2021-09-23: 03:00:00 1000 mL via INTRAVENOUS

## 2021-09-23 MED ORDER — TAMSULOSIN HCL 0.4 MG PO CAPS: 0.4000 mg | ORAL_CAPSULE | Freq: Every day | ORAL | 0 refills | Status: AC

## 2021-09-23 MED ORDER — AMLODIPINE BESYLATE 10 MG PO TABS: ORAL_TABLET | ORAL | 3 refills | Status: DC

## 2021-09-23 MED ORDER — KETOROLAC TROMETHAMINE 15 MG/ML IJ SOLN
15.0000 mg | Freq: Once | INTRAMUSCULAR | Status: AC
  Administered 2021-09-23: 05:00:00 15 mg via INTRAVENOUS
  Filled 2021-09-23: qty 1

## 2021-09-23 MED ORDER — TAMSULOSIN HCL 0.4 MG PO CAPS
0.4000 mg | ORAL_CAPSULE | Freq: Once | ORAL | Status: AC
  Administered 2021-09-23: 06:00:00 0.4 mg via ORAL
  Filled 2021-09-23: qty 1

## 2021-09-23 NOTE — Telephone Encounter (Signed)
I have called the pt back and relayed the message from the provider. She stated understanding and will give Urology a call.  ?

## 2021-09-23 NOTE — ED Triage Notes (Signed)
BIB EMS for Rt side flank pain with pressure when urinating, no n/v ?

## 2021-09-23 NOTE — ED Notes (Signed)
Lab called to add on hepatic function and lipase to blood already sent.  ?

## 2021-09-23 NOTE — Telephone Encounter (Signed)
Please make sure she understands she needs to call urology as directed by ER provider.  ? ?She needs to be seen about that stone as soon as possible. I will also put in an emergent referral for her. She can take the Tamsulosin that she was given. ? ?[facility] ?Marcine Matar, MD ?[address] ?[place] [address] ?[phone] ?

## 2021-09-23 NOTE — ED Provider Notes (Signed)
[facility]-EMERGENCY DEPT Provider Note  CSN: [id number] Arrival date & time: 09/23/21 0151  Chief Complaint(s) Flank Pain  HPI Ernest Orr is a 41 y.o. female     Flank Pain This is a new problem. The current episode started 1 to 2 hours ago. The problem occurs constantly. The problem has not changed since onset.Pertinent negatives include no chest pain, no abdominal pain and no headaches. Nothing aggravates the symptoms. Nothing relieves the symptoms. She has tried nothing for the symptoms.   Past Medical History Past Medical History:  Diagnosis Date   Gallstone    Headache(784.0)    Heart murmur    Hypertension    Infection    UTI   Maternal chronic hypertension in first trimester 04/09/2011   Pregnancy induced hypertension    after 2nd delivery   Vaginal Pap smear, abnormal    cant remember   Patient Active Problem List   Diagnosis Date Noted   Sore throat 11/01/2018   Depression affecting pregnancy 03/06/2018   Vitamin D deficiency 12/14/2017   Murmur, cardiac 11/30/2016   Anemia 11/30/2016   Migraine 03/18/2016   Subclinical hyperthyroidism 12/09/2015   Chronic hypertension with superimposed preeclampsia 04/09/2011   Home Medication(s) Prior to Admission medications   Medication Sig Start Date End Date Taking? Authorizing Provider  ibuprofen (ADVIL) 600 MG tablet Take 1 tablet (600 mg total) by mouth every 6 (six) hours as needed. 09/23/21  Yes Rosalio Catterton, Amadeo Garnet, MD  ondansetron (ZOFRAN-ODT) 4 MG disintegrating tablet Take 1 tablet (4 mg total) by mouth every 8 (eight) hours as needed for up to 3 days for nausea or vomiting. 09/23/21 09/26/21 Yes Narada Uzzle, Amadeo Garnet, MD  tamsulosin (FLOMAX) 0.4 MG CAPS capsule Take 1 capsule (0.4 mg total) by mouth daily for 5 days. Do not breastfeed while taking this medication 09/23/21 09/28/21 Yes Ledford Goodson, Amadeo Garnet, MD  acetaminophen (TYLENOL) 500 MG tablet Take 500 mg by mouth every 6 (six) hours  as needed.    [provider]  amLODipine (NORVASC) 10 MG tablet TAKE 1 TABLET(10 MG) BY MOUTH DAILY 07/30/21   Olive Bass, FNP  cyclobenzaprine (FLEXERIL) 10 MG tablet Take 1 tablet (10 mg total) by mouth 2 (two) times daily as needed for muscle spasms. 02/11/21   Wallis Bamberg, PA-C  etonogestrel (NEXPLANON) 68 MG IMPL implant 1 each by Subdermal route once.    [provider]  Multiple Vitamin (MULTIVITAMIN WITH MINERALS) TABS tablet Take 1 tablet by mouth daily.    [provider]  ondansetron (ZOFRAN) 4 MG tablet Take 1 tablet (4 mg total) by mouth every 6 (six) hours as needed for nausea or vomiting. 02/26/21   Gustavus Bryant, FNP  cetirizine (ZYRTEC ALLERGY) 10 MG tablet Take 1 tablet (10 mg total) by mouth daily. 09/27/20 11/24/20  Rhys Martini, PA-C  ipratropium (ATROVENT) 0.06 % nasal spray Place 2 sprays into both nostrils 4 (four) times daily. 11/21/19 01/15/20  Belinda Fisher, PA-C  Allergies Patient has no known allergies.  Review of Systems Review of Systems  Cardiovascular:  Negative for chest pain.  Gastrointestinal:  Negative for abdominal pain.  Genitourinary:  Positive for flank pain and frequency. Negative for dysuria.  Neurological:  Negative for headaches.  As noted in HPI  Physical Exam Vital Signs  I have reviewed the triage vital signs BP (!) 145/92 (BP Location: Left Arm)    Pulse 90    Temp 97.8 F (36.6 C) (Oral)    Resp 16    Ht  (1.651 m)    Wt 78 kg    SpO2 100%    BMI 28.62 kg/m   Physical Exam Vitals reviewed.  Constitutional:      General: She is not in acute distress.    Appearance: She is well-developed. She is not diaphoretic.  HENT:     Head: Normocephalic and atraumatic.     Right Ear: External ear normal.     Left Ear: External ear normal.     Nose: Nose normal.  Eyes:      General: No scleral icterus.    Conjunctiva/sclera: Conjunctivae normal.  Neck:     Trachea: Phonation normal.  Cardiovascular:     Rate and Rhythm: Normal rate and regular rhythm.  Pulmonary:     Effort: Pulmonary effort is normal. No respiratory distress.     Breath sounds: No stridor.  Abdominal:     General: There is no distension.     Tenderness: There is no abdominal tenderness. There is no right CVA tenderness or left CVA tenderness.  Musculoskeletal:        General: Normal range of motion.     Cervical back: Normal range of motion.  Neurological:     Mental Status: She is alert and oriented to person, place, and time.  Psychiatric:        Behavior: Behavior normal.    ED Results and Treatments Labs (all labs ordered are listed, but only abnormal results are displayed) Labs Reviewed  URINALYSIS, ROUTINE W REFLEX MICROSCOPIC - Abnormal; Notable for the following components:      Result Value   Color, Urine COLORLESS (*)    Hgb urine dipstick MODERATE (*)    All other components within normal limits  BASIC METABOLIC PANEL - Abnormal; Notable for the following components:   Glucose, Bld 105 (*)    All other components within normal limits  HEPATIC FUNCTION PANEL - Abnormal; Notable for the following components:   Total Protein 9.0 (*)    All other components within normal limits  CBC  LIPASE, BLOOD  I-STAT BETA HCG BLOOD, ED ([facility], [facility], [facility] ONLY)                                                                                                                         EKG  EKG Interpretation  Date/Time:    Ventricular Rate:    PR Interval:    QRS Duration:  QT Interval:    QTC Calculation:   R Axis:     Text Interpretation:         Radiology CT Renal Stone Study  Result Date: 09/23/2021 CLINICAL DATA:  Right flank pain.  Pressure with urination. EXAM: CT ABDOMEN AND PELVIS WITHOUT CONTRAST TECHNIQUE: Multidetector CT imaging of the abdomen and pelvis was  performed following the standard protocol without IV contrast. RADIATION DOSE REDUCTION: This exam was performed according to the departmental dose-optimization program which includes automated exposure control, adjustment of the mA and/or kV according to patient size and/or use of iterative reconstruction technique. COMPARISON:  None. FINDINGS: Lower chest:  Mild atelectasis at the left base. Hepatobiliary: No focal liver abnormality.No evidence of biliary obstruction or stone. Pancreas: Unremarkable. Spleen: Unremarkable. Adrenals/Urinary Tract: Negative adrenals. Right hydroureteronephrosis to the level of a 5 mm UVJ stone. No additional urolithiasis. Unremarkable bladder. Stomach/Bowel:  No obstruction. No appendicitis. Vascular/Lymphatic: No acute vascular abnormality. No mass or adenopathy. Reproductive:No pathologic findings. Other: No ascites or pneumoperitoneum. Musculoskeletal: No acute abnormalities. IMPRESSION: Obstructing 5 mm right UVJ calculus. Electronically Signed   By: Tiburcio Pea M.D.   On: 09/23/2021 04:24    Pertinent labs & imaging results that were available during my care of the patient were reviewed by me and considered in my medical decision making (see MDM for details).  Medications Ordered in ED Medications  tamsulosin (FLOMAX) capsule 0.4 mg (has no administration in time range)  ketorolac (TORADOL) 15 MG/ML injection 15 mg (15 mg Intravenous Given 09/23/21 0312)  sodium chloride 0.9 % bolus 1,000 mL (1,000 mLs Intravenous New Bag/Given 09/23/21 0312)  ketorolac (TORADOL) 15 MG/ML injection 15 mg (15 mg Intravenous Given 09/23/21 0458)                                                                                                                                     Procedures Procedures  (including critical care time)  Medical Decision Making / ED Course    Complexity of Problem:  Co-morbidities/SDOH that complicate the patient evaluation/care: none  Additional  history obtained: none  Patient's presenting problem/concern and DDX listed below: Flank pain, right Renal colic, pyelonephritis/UTI, biliary colic, msk      Complexity of Data:   Cardiac Monitoring: none  Laboratory Tests ordered listed below with my independent interpretation: CBC without leukocytosis or anemia No significant electrolyte derangement or renal sufficiency No evidence of bili obstruction or pancreatitis UA without evidence of infection.  Notable for hematuria.   Imaging Studies ordered listed below with my independent interpretation: CT stone study likely stone close to bladder. Radiology confirmed     ED Course:    Hospitalization Considered:  no  Assessment, Intervention, and Reassessment: Flank pain Consistent with renal colic IVF and toradol given Flomax Pain controlled    Final Clinical Impression(s) / ED Diagnoses Final diagnoses:  Ureterolithiasis   The patient appears reasonably screened and/or  stabilized for discharge and I doubt any other medical condition or other [facility] requiring further screening, evaluation, or treatment in the ED at this time prior to discharge. Safe for discharge with strict return precautions.  Disposition: Discharge  Condition: Good  I have discussed the results, Dx and Tx plan with the patient/family who expressed understanding and agree(s) with the plan. Discharge instructions discussed at length. The patient/family was given strict return precautions who verbalized understanding of the instructions. No further questions at time of discharge.    ED Discharge Orders          Ordered    ondansetron (ZOFRAN-ODT) 4 MG disintegrating tablet  Every 8 hours PRN        09/23/21 0557    tamsulosin (FLOMAX) 0.4 MG CAPS capsule  Daily        09/23/21 0557    ibuprofen (ADVIL) 600 MG tablet  Every 6 hours PRN        09/23/21 0557             Follow Up: Marcine Matar, MD [address] [place]  [address] [phone]  Call  to schedule an appointment for close follow up  Olive Bass, FNP [address] [place] [address] [phone]  Call  as needed           This chart was dictated using voice recognition software.  Despite best efforts to proofread,  errors can occur which can change the documentation meaning.    Nira Conn, MD 09/23/21 [phone]

## 2021-09-23 NOTE — ED Notes (Signed)
Patient making multiple trips to the bathroom.  Patient reports she feels like she's not fully emptying her bladder.  ?

## 2021-10-04 MED ORDER — AMLODIPINE BESYLATE 10 MG PO TABS: ORAL_TABLET | ORAL | 3 refills | Status: DC

## 2021-10-08 ENCOUNTER — Other Ambulatory Visit (HOSPITAL_COMMUNITY): Payer: Self-pay | Admitting: Adult Health

## 2021-10-08 ENCOUNTER — Other Ambulatory Visit: Payer: Self-pay | Admitting: Adult Health

## 2021-10-08 DIAGNOSIS — N201 Calculus of ureter: Secondary | ICD-10-CM

## 2021-10-11 ENCOUNTER — Ambulatory Visit (HOSPITAL_COMMUNITY): Payer: Medicaid Other

## 2021-10-15 ENCOUNTER — Ambulatory Visit (HOSPITAL_COMMUNITY)
Admission: RE | Admit: 2021-10-15 | Discharge: 2021-10-15 | Disposition: A | Discharge status: Home / Self Care | Payer: Medicaid Other | Source: Ambulatory Visit | Attending: Adult Health | Admitting: Adult Health

## 2021-10-15 ENCOUNTER — Other Ambulatory Visit: Payer: Self-pay | Admitting: General Surgery

## 2021-10-15 ENCOUNTER — Ambulatory Visit (HOSPITAL_COMMUNITY): Admission: RE | Admit: 2021-10-15 | Payer: Medicaid Other | Source: Ambulatory Visit

## 2021-10-15 DIAGNOSIS — N201 Calculus of ureter: Secondary | ICD-10-CM | POA: Insufficient documentation

## 2021-10-15 DIAGNOSIS — E041 Nontoxic single thyroid nodule: Secondary | ICD-10-CM

## 2021-10-28 ENCOUNTER — Ambulatory Visit
Admission: RE | Admit: 2021-10-28 | Discharge: 2021-10-28 | Disposition: A | Discharge status: Home / Self Care | Payer: Medicaid Other | Source: Ambulatory Visit | Attending: General Surgery | Admitting: General Surgery

## 2021-10-28 ENCOUNTER — Other Ambulatory Visit (HOSPITAL_COMMUNITY)
Admission: RE | Admit: 2021-10-28 | Discharge: 2021-10-28 | Disposition: A | Discharge status: Home / Self Care | Payer: Medicaid Other | Source: Ambulatory Visit | Attending: Interventional Radiology | Admitting: Interventional Radiology

## 2021-10-28 DIAGNOSIS — E041 Nontoxic single thyroid nodule: Secondary | ICD-10-CM | POA: Insufficient documentation

## 2021-10-29 LAB — CYTOLOGY - NON PAP

## 2021-11-12 ENCOUNTER — Encounter: Payer: Self-pay | Admitting: Family

## 2021-12-03 ENCOUNTER — Ambulatory Visit
Admission: EM | Admit: 2021-12-03 | Discharge: 2021-12-03 | Disposition: A | Discharge status: Home / Self Care | Payer: Medicaid Other | Attending: Internal Medicine | Admitting: Internal Medicine

## 2021-12-03 DIAGNOSIS — J069 Acute upper respiratory infection, unspecified: Secondary | ICD-10-CM | POA: Insufficient documentation

## 2021-12-03 DIAGNOSIS — J029 Acute pharyngitis, unspecified: Secondary | ICD-10-CM | POA: Diagnosis present

## 2021-12-03 LAB — POCT RAPID STREP A (OFFICE): Rapid Strep A Screen: NEGATIVE

## 2021-12-03 MED ORDER — FLUTICASONE PROPIONATE 50 MCG/ACT NA SUSP: 1.0000 | Freq: Every day | NASAL | 0 refills | Status: DC

## 2021-12-03 NOTE — ED Provider Notes (Signed)
[name]    CSN: [id number] Arrival date & time: 12/03/21  1723      History   Chief Complaint Chief Complaint  Patient presents with   right ear pain    HPI Brittany [name] is a 41 y.o. female.   Patient presents with sore throat, nasal congestion, right ear pain that started upon awakening this morning.  Denies any known fevers but patient reports that her daughter has had similar symptoms recently.  Denies cough, chest pain, shortness of breath, nausea, vomiting, diarrhea, abdominal pain.  Patient has taken over-the-counter cold and flu medication with no improvement in symptoms.    Past Medical History:  Diagnosis Date   Gallstone    Headache(784.0)    Heart murmur    Hypertension    Infection    UTI   Maternal chronic hypertension in first trimester 04/09/2011   Pregnancy induced hypertension    after 2nd delivery   Vaginal Pap smear, abnormal    cant remember    Patient Active Problem List   Diagnosis Date Noted   Sore throat 11/01/2018   Depression affecting pregnancy 03/06/2018   Vitamin D deficiency 12/14/2017   Murmur, cardiac 11/30/2016   Anemia 11/30/2016   Migraine 03/18/2016   Subclinical hyperthyroidism 12/09/2015   Chronic hypertension with superimposed preeclampsia 04/09/2011    Past Surgical History:  Procedure Laterality Date   DILATION AND CURETTAGE OF UTERUS     dont remember year   LEEP      OB History     Gravida  9   Para  4   Term  2   Preterm  2   AB  5   Living  4      SAB  4   IAB  1   Ectopic  0   Multiple  0   Live Births  4            Home Medications    Prior to Admission medications   Medication Sig Start Date End Date Taking? Authorizing Provider  fluticasone (FLONASE) 50 MCG/ACT nasal spray Place 1 spray into both nostrils daily for 3 days. 12/03/21 12/06/21 Yes Nizhoni Parlow, Acie FredricksonHaley E, FNP  acetaminophen (TYLENOL) 500 MG tablet Take 500 mg by mouth every 6 (six) hours as needed.     [provider]  amLODipine (NORVASC) 10 MG tablet TAKE 1 TABLET(10 MG) BY MOUTH DAILY 10/04/21   Olive BassMurray, Laura Woodruff, FNP  cyclobenzaprine (FLEXERIL) 10 MG tablet Take 1 tablet (10 mg total) by mouth 2 (two) times daily as needed for muscle spasms. 02/11/21   Wallis BambergMani, Mario, PA-C  etonogestrel (NEXPLANON) 68 MG IMPL implant 1 each by Subdermal route once.    [provider]  ibuprofen (ADVIL) 600 MG tablet Take 1 tablet (600 mg total) by mouth every 6 (six) hours as needed. 09/23/21   Nira Connardama, Pedro Eduardo, MD  Multiple Vitamin (MULTIVITAMIN WITH MINERALS) TABS tablet Take 1 tablet by mouth daily.    [provider]  ondansetron (ZOFRAN) 4 MG tablet Take 1 tablet (4 mg total) by mouth every 6 (six) hours as needed for nausea or vomiting. 02/26/21   Gustavus BryantMound, Rex Magee E, FNP  cetirizine (ZYRTEC ALLERGY) 10 MG tablet Take 1 tablet (10 mg total) by mouth daily. 09/27/20 11/24/20  Rhys MartiniGraham, Laura E, PA-C  ipratropium (ATROVENT) 0.06 % nasal spray Place 2 sprays into both nostrils 4 (four) times daily. 11/21/19 01/15/20  Belinda FisherYu, Amy V, PA-C    Family History  Family History  Problem Relation Age of Onset   Asthma Mother    Hypertension Mother    CAD Mother    Asthma Maternal Grandmother    Hypertension Maternal Grandmother     Social History Social History   Tobacco Use   Smoking status: Never   Smokeless tobacco: Never  Vaping Use   Vaping Use: Never used  Substance Use Topics   Alcohol use: No    Alcohol/week: 0.0 standard drinks   Drug use: No     Allergies   Patient has no known allergies.   Review of Systems Review of Systems Per HPI  Physical Exam Triage Vital Signs ED Triage Vitals  Enc Vitals Group     BP 12/03/21 1829 (!) 137/98     Pulse Rate 12/03/21 1829 83     Resp 12/03/21 1829 18     Temp 12/03/21 1829 98.3 F (36.8 C)     Temp Source 12/03/21 1829 Oral     SpO2 12/03/21 1829 98 %     Weight --      Height --      Head Circumference --       Peak Flow --      Pain Score 12/03/21 1830 0     Pain Loc --      Pain Edu? --      Excl. in GC? --    No data found.  Updated Vital Signs BP (!) 137/98 (BP Location: Left Arm)   Pulse 83   Temp 98.3 F (36.8 C) (Oral)   Resp 18   SpO2 98%   Breastfeeding Yes   Visual Acuity Right Eye Distance:   Left Eye Distance:   Bilateral Distance:    Right Eye Near:   Left Eye Near:    Bilateral Near:     Physical Exam Constitutional:      General: She is not in acute distress.    Appearance: Normal appearance. She is not toxic-appearing or diaphoretic.  HENT:     Head: Normocephalic and atraumatic.     Right Ear: Tympanic membrane and ear canal normal.     Left Ear: Tympanic membrane and ear canal normal.     Nose: Congestion present.     Mouth/Throat:     Mouth: Mucous membranes are moist.     Pharynx: Posterior oropharyngeal erythema present.  Eyes:     Extraocular Movements: Extraocular movements intact.     Conjunctiva/sclera: Conjunctivae normal.     Pupils: Pupils are equal, round, and reactive to light.  Cardiovascular:     Rate and Rhythm: Normal rate and regular rhythm.     Pulses: Normal pulses.     Heart sounds: Normal heart sounds.  Pulmonary:     Effort: Pulmonary effort is normal. No respiratory distress.     Breath sounds: Normal breath sounds. No stridor. No wheezing, rhonchi or rales.  Abdominal:     General: Abdomen is flat. Bowel sounds are normal.     Palpations: Abdomen is soft.  Musculoskeletal:        General: Normal range of motion.     Cervical back: Normal range of motion.  Skin:    General: Skin is warm and dry.  Neurological:     General: No focal deficit present.     Mental Status: She is alert and oriented to person, place, and time. Mental status is at baseline.  Psychiatric:        Mood and Affect:  Mood normal.        Behavior: Behavior normal.     UC Treatments / Results  Labs (all labs ordered are listed, but only abnormal  results are displayed) Labs Reviewed  CULTURE, GROUP A STREP ([facility])  NOVEL CORONAVIRUS, NAA  POCT RAPID STREP A (OFFICE)    EKG   Radiology No results found.  Procedures Procedures (including critical care time)  Medications Ordered in UC Medications - No data to display  Initial Impression / Assessment and Plan / UC Course  I have reviewed the triage vital signs and the nursing notes.  Pertinent labs & imaging results that were available during my care of the patient were reviewed by me and considered in my medical decision making (see chart for details).     Patient presents with symptoms likely from a viral upper respiratory infection. Differential includes bacterial pneumonia, sinusitis, allergic rhinitis, COVID-19, flu. Do not suspect underlying cardiopulmonary process. Symptoms seem unlikely related to ACS, CHF or COPD exacerbations, pneumonia, pneumothorax. Patient is nontoxic appearing and not in need of emergent medical intervention.  Strep was negative.  Throat culture and COVID test pending.  Recommended symptom control with over the counter medications that are safe with breast-feeding.  Flonase sent for patient.  Return if symptoms fail to improve in 1-2 weeks or you develop shortness of breath, chest pain, severe headache. Patient states understanding and is agreeable.  Discharged with PCP followup.  Final Clinical Impressions(s) / UC Diagnoses   Final diagnoses:  Viral upper respiratory infection  Sore throat     Discharge Instructions      Strep was negative.  Throat culture and COVID test pending.  We will call if it is positive.  You have been prescribed a nasal spray to help alleviate your discomfort and symptoms.  Please follow-up if symptoms persist or worsen.     ED Prescriptions     Medication Sig Dispense Auth. Provider   fluticasone (FLONASE) 50 MCG/ACT nasal spray Place 1 spray into both nostrils daily for 3 days. 16 g Gustavus Bryant, [place]       PDMP not reviewed this encounter.   Gustavus Bryant, [place] 12/03/21 1904

## 2021-12-03 NOTE — Discharge Instructions (Signed)
Strep was negative.  Throat culture and COVID test pending.  We will call if it is positive.  You have been prescribed a nasal spray to help alleviate your discomfort and symptoms.  Please follow-up if symptoms persist or worsen.

## 2021-12-03 NOTE — ED Triage Notes (Signed)
Pt c/o right ear pain onset this morning

## 2021-12-05 LAB — NOVEL CORONAVIRUS, NAA: SARS-CoV-2, NAA: NOT DETECTED

## 2021-12-06 LAB — CULTURE, GROUP A STREP (THRC)

## 2021-12-31 ENCOUNTER — Encounter: Payer: Self-pay | Admitting: Family

## 2021-12-31 MED ORDER — AMLODIPINE BESYLATE 10 MG PO TABS: ORAL_TABLET | ORAL | 2 refills | Status: DC

## 2022-01-03 ENCOUNTER — Ambulatory Visit
Admission: EM | Admit: 2022-01-03 | Discharge: 2022-01-03 | Disposition: A | Discharge status: Home / Self Care | Payer: Medicaid Other | Attending: Internal Medicine | Admitting: Internal Medicine

## 2022-01-03 DIAGNOSIS — M545 Low back pain, unspecified: Secondary | ICD-10-CM | POA: Diagnosis not present

## 2022-01-03 MED ORDER — NAPROXEN 375 MG PO TABS: 375.0000 mg | ORAL_TABLET | Freq: Two times a day (BID) | ORAL | 0 refills | Status: DC

## 2022-01-03 MED ORDER — METHOCARBAMOL 500 MG PO TABS: 500.0000 mg | ORAL_TABLET | Freq: Every evening | ORAL | 0 refills | Status: DC | PRN

## 2022-01-03 NOTE — ED Triage Notes (Signed)
Pt presents with lower back pain since waking up this morning.

## 2022-01-03 NOTE — ED Provider Notes (Signed)
[name]    CSN: [id number] Arrival date & time: 01/03/22  1800      History   Chief Complaint Chief Complaint  Patient presents with   Back Pain    HPI Brittany [name] is a 41 y.o. female comes to the urgent care with 1 day history of sharp low back pain.  Pain is currently 8 out of 10, sharp and associated with movement.  Pain is aggravated by movement.  No known relieving factors.  Pain does not radiate into the lower extremities.  No falls or trauma to the back.  No urinary symptoms.  No dysuria, urgency or frequency.  No difficulty with bowel or bladder continence.   HPI  Past Medical History:  Diagnosis Date   Gallstone    Headache(784.0)    Heart murmur    Hypertension    Infection    UTI   Maternal chronic hypertension in first trimester 04/09/2011   Pregnancy induced hypertension    after 2nd delivery   Vaginal Pap smear, abnormal    cant remember    Patient Active Problem List   Diagnosis Date Noted   Sore throat 11/01/2018   Depression affecting pregnancy 03/06/2018   Vitamin D deficiency 12/14/2017   Murmur, cardiac 11/30/2016   Anemia 11/30/2016   Migraine 03/18/2016   Subclinical hyperthyroidism 12/09/2015   Chronic hypertension with superimposed preeclampsia 04/09/2011    Past Surgical History:  Procedure Laterality Date   DILATION AND CURETTAGE OF UTERUS     dont remember year   LEEP      OB History     Gravida  9   Para  4   Term  2   Preterm  2   AB  5   Living  4      SAB  4   IAB  1   Ectopic  0   Multiple  0   Live Births  4            Home Medications    Prior to Admission medications   Medication Sig Start Date End Date Taking? Authorizing Provider  methocarbamol (ROBAXIN) 500 MG tablet Take 1 tablet (500 mg total) by mouth at bedtime as needed for muscle spasms. 01/03/22  Yes Karlita Lichtman, Britta Mccreedy, MD  naproxen (NAPROSYN) 375 MG tablet Take 1 tablet (375 mg total) by mouth 2 (two) times  daily. 01/03/22  Yes Mayreli Alden, Britta Mccreedy, MD  acetaminophen (TYLENOL) 500 MG tablet Take 500 mg by mouth every 6 (six) hours as needed.    [provider]  amLODipine (NORVASC) 10 MG tablet TAKE 1 TABLET(10 MG) BY MOUTH DAILY 12/31/21   Olive Bass, FNP  etonogestrel (NEXPLANON) 68 MG IMPL implant 1 each by Subdermal route once.    [provider]  fluticasone (FLONASE) 50 MCG/ACT nasal spray Place 1 spray into both nostrils daily for 3 days. 12/03/21 12/06/21  Gustavus Bryant, FNP  ibuprofen (ADVIL) 600 MG tablet Take 1 tablet (600 mg total) by mouth every 6 (six) hours as needed. 09/23/21   Nira Conn, MD  Multiple Vitamin (MULTIVITAMIN WITH MINERALS) TABS tablet Take 1 tablet by mouth daily.    [provider]  ondansetron (ZOFRAN) 4 MG tablet Take 1 tablet (4 mg total) by mouth every 6 (six) hours as needed for nausea or vomiting. 02/26/21   Gustavus Bryant, FNP  cetirizine (ZYRTEC ALLERGY) 10 MG tablet Take 1 tablet (10 mg total) by mouth daily.  09/27/20 11/24/20  Rhys Martini, PA-C  ipratropium (ATROVENT) 0.06 % nasal spray Place 2 sprays into both nostrils 4 (four) times daily. 11/21/19 01/15/20  Belinda Fisher, PA-C    Family History Family History  Problem Relation Age of Onset   Asthma Mother    Hypertension Mother    CAD Mother    Asthma Maternal Grandmother    Hypertension Maternal Grandmother     Social History Social History   Tobacco Use   Smoking status: Never   Smokeless tobacco: Never  Vaping Use   Vaping Use: Never used  Substance Use Topics   Alcohol use: No    Alcohol/week: 0.0 standard drinks of alcohol   Drug use: No     Allergies   Patient has no known allergies.   Review of Systems Review of Systems  Gastrointestinal: Negative.   Musculoskeletal:  Positive for arthralgias and back pain. Negative for gait problem, joint swelling and myalgias.  Skin: Negative.      Physical Exam Triage Vital Signs ED Triage  Vitals  Enc Vitals Group     BP 01/03/22 1812 133/85     Pulse Rate 01/03/22 1812 77     Resp 01/03/22 1812 18     Temp 01/03/22 1812 98.6 F (37 C)     Temp Source 01/03/22 1812 Oral     SpO2 01/03/22 1812 98 %     Weight --      Height --      Head Circumference --      Peak Flow --      Pain Score 01/03/22 1814 8     Pain Loc --      Pain Edu? --      Excl. in GC? --    No data found.  Updated Vital Signs BP 133/85 (BP Location: Left Arm)   Pulse 77   Temp 98.6 F (37 C) (Oral)   Resp 18   SpO2 98%   Visual Acuity Right Eye Distance:   Left Eye Distance:   Bilateral Distance:    Right Eye Near:   Left Eye Near:    Bilateral Near:     Physical Exam Vitals and nursing note reviewed.  Constitutional:      General: She is not in acute distress.    Appearance: She is not ill-appearing.  Cardiovascular:     Rate and Rhythm: Normal rate and regular rhythm.     Pulses: Normal pulses.     Heart sounds: Normal heart sounds.  Pulmonary:     Effort: Pulmonary effort is normal.     Breath sounds: Normal breath sounds.  Musculoskeletal:        General: Tenderness present. Normal range of motion.     Comments: Tenderness on palpation of the right sacroiliac joint.  Neurological:     Mental Status: She is alert.      UC Treatments / Results  Labs (all labs ordered are listed, but only abnormal results are displayed) Labs Reviewed - No data to display  EKG   Radiology No results found.  Procedures Procedures (including critical care time)  Medications Ordered in UC Medications - No data to display  Initial Impression / Assessment and Plan / UC Course  I have reviewed the triage vital signs and the nursing notes.  Pertinent labs & imaging results that were available during my care of the patient were reviewed by me and considered in my medical decision making (see chart for  details).     1.  Acute low back pain without sciatica: Naproxen 375 mg  twice daily Robaxin 500 mg at bedtime as needed for muscle spasms Gentle stretching exercises Heating pad use Return precautions given. Final Clinical Impressions(s) / UC Diagnoses   Final diagnoses:  Acute midline low back pain without sciatica     Discharge Instructions      Please take medications as prescribed. Take Robaxin only at bedtime and avoid driving or operating heavy machinery after taking muscle relaxants Gentle stretching exercises Humidifier use on a 20-minute on-20 minutes off cycle Return to urgent care if symptoms worsen.   ED Prescriptions     Medication Sig Dispense Auth. Provider   methocarbamol (ROBAXIN) 500 MG tablet Take 1 tablet (500 mg total) by mouth at bedtime as needed for muscle spasms. 20 tablet Caisley Baxendale, Britta Mccreedy, MD   naproxen (NAPROSYN) 375 MG tablet Take 1 tablet (375 mg total) by mouth 2 (two) times daily. 20 tablet Karisma Meiser, Britta Mccreedy, MD      PDMP not reviewed this encounter.   Merrilee Jansky, MD 01/03/22 [phone]

## 2022-01-03 NOTE — Discharge Instructions (Addendum)
Please take medications as prescribed. Take Robaxin only at bedtime and avoid driving or operating heavy machinery after taking muscle relaxants Gentle stretching exercises Humidifier use on a 20-minute on-20 minutes off cycle Return to urgent care if symptoms worsen.

## 2022-02-10 ENCOUNTER — Encounter: Payer: Self-pay | Admitting: Advanced Practice Midwife

## 2022-02-10 ENCOUNTER — Ambulatory Visit (INDEPENDENT_AMBULATORY_CARE_PROVIDER_SITE_OTHER): Payer: Medicaid Other | Admitting: Advanced Practice Midwife

## 2022-02-10 ENCOUNTER — Other Ambulatory Visit (HOSPITAL_COMMUNITY)
Admission: RE | Admit: 2022-02-10 | Discharge: 2022-02-10 | Disposition: A | Discharge status: Home / Self Care | Payer: Medicaid Other | Source: Ambulatory Visit | Attending: Advanced Practice Midwife | Admitting: Advanced Practice Midwife

## 2022-02-10 VITALS — BP 133/81 | Ht 65.0 in | Wt 176.0 lb

## 2022-02-10 DIAGNOSIS — Z3046 Encounter for surveillance of implantable subdermal contraceptive: Secondary | ICD-10-CM | POA: Insufficient documentation

## 2022-02-10 DIAGNOSIS — Z01419 Encounter for gynecological examination (general) (routine) without abnormal findings: Secondary | ICD-10-CM | POA: Insufficient documentation

## 2022-02-10 DIAGNOSIS — Z319 Encounter for procreative management, unspecified: Secondary | ICD-10-CM

## 2022-02-10 MED ORDER — PRENATAL PLUS 27-1 MG PO TABS: 1.0000 | ORAL_TABLET | Freq: Every day | ORAL | 11 refills | Status: AC

## 2022-02-10 MED ORDER — IBUPROFEN 600 MG PO TABS: 600.0000 mg | ORAL_TABLET | Freq: Four times a day (QID) | ORAL | 0 refills | Status: AC | PRN

## 2022-02-10 NOTE — Progress Notes (Signed)
     GYNECOLOGY OFFICE PROCEDURE NOTE  Brittany [name] is a 41 y.o. [phone] here for Nexplanon removal. Desire pregnancy.  Nexplanon Removal Patient identified, informed consent performed, consent signed.   Appropriate time out taken. Nexplanon site identified.  Area prepped in usual sterile fashon. One ml of 1% lidocaine was used to anesthetize the area at the distal end of the implant. A small stab incision was made right beside the implant on the distal portion.  The Nexplanon rod was grasped using hemostats and removed without difficulty.  There was minimal blood loss. There were no complications.  3 ml of 1% lidocaine was injected around the incision for post-procedure analgesia.  Steri-strips were applied over the small incision.  A pressure bandage was applied to reduce any bruising.  The patient tolerated the procedure well and was given post procedure instructions.    Clayton Bibles, MSA, MSN, CNM Certified Nurse Midwife, Biochemist, clinical for [employer], [facility]

## 2022-02-10 NOTE — Progress Notes (Signed)
GYNECOLOGY ANNUAL PREVENTATIVE CARE ENCOUNTER NOTE  History:     Brittany [name] is a 41 y.o. [phone] female here for a routine annual gynecologic exam.  Current complaints: none.   Denies abnormal vaginal bleeding, discharge, pelvic pain, problems with intercourse or other gynecologic concerns.   Patient works as an International aid/development worker at [employer]. She lives with her husband and children. She is trying to better with making time for herself and exercising.   Patient desires pregnancy before she turns 42. She requests Nexplanon removal today.   Gynecologic History No LMP recorded. Patient has had an implant. Contraception: Nexplanon Last Pap: 09/19/2019. Result was normal with negative HPV Last Mammogram: N/A.   Obstetric History OB History  Gravida Para Term Preterm AB Living  9 4 2 2 5 4   SAB IAB Ectopic Multiple Live Births  4 1 0 0 4    # Outcome Date GA Lbr Len/2nd Weight Sex Delivery Anes PTL Lv  9 Preterm 06/13/18 [redacted]w[redacted]d 07:04 / 00:15 5 lb 1.1 oz (2.3 kg) F Vag-Spont EPI  LIV  8 Term 05/03/16 [redacted]w[redacted]d 00:08 / 00:01 4 lb 12.7 oz (2.175 kg) M Vag-Spont None  LIV  7 Preterm 04/06/11 [redacted]w[redacted]d 08:25 / 00:08 5 lb 6.6 oz (2.455 kg) F Vag-Spont None  LIV     Complications: PIH (pregnancy induced hypertension)  6 Term 10/21/05 [redacted]w[redacted]d    Vag-Spont        Complications: Failure to Progress in Second Stage  5 IAB           4 SAB           3 SAB           2 SAB           1 SAB             Past Medical History:  Diagnosis Date   Gallstone    Headache(784.0)    Heart murmur    Hypertension    Infection    UTI   Maternal chronic hypertension in first trimester 04/09/2011   Pregnancy induced hypertension    after 2nd delivery   Vaginal Pap smear, abnormal    cant remember    Past Surgical History:  Procedure Laterality Date   DILATION AND CURETTAGE OF UTERUS     dont remember year   LEEP      Current Outpatient Medications on File Prior to Visit  Medication Sig Dispense  Refill   amLODipine (NORVASC) 10 MG tablet TAKE 1 TABLET(10 MG) BY MOUTH DAILY 90 tablet 2   etonogestrel (NEXPLANON) 68 MG IMPL implant 1 each by Subdermal route once.     acetaminophen (TYLENOL) 500 MG tablet Take 500 mg by mouth every 6 (six) hours as needed. (Patient not taking: Reported on 02/10/2022)     fluticasone (FLONASE) 50 MCG/ACT nasal spray Place 1 spray into both nostrils daily for 3 days. 16 g 0   methocarbamol (ROBAXIN) 500 MG tablet Take 1 tablet (500 mg total) by mouth at bedtime as needed for muscle spasms. (Patient not taking: Reported on 02/10/2022) 20 tablet 0   Multiple Vitamin (MULTIVITAMIN WITH MINERALS) TABS tablet Take 1 tablet by mouth daily. (Patient not taking: Reported on 02/10/2022)     naproxen (NAPROSYN) 375 MG tablet Take 1 tablet (375 mg total) by mouth 2 (two) times daily. (Patient not taking: Reported on 02/10/2022) 20 tablet 0   ondansetron (ZOFRAN) 4 MG tablet Take 1 tablet (4  mg total) by mouth every 6 (six) hours as needed for nausea or vomiting. (Patient not taking: Reported on 02/10/2022) 12 tablet 0   [DISCONTINUED] cetirizine (ZYRTEC ALLERGY) 10 MG tablet Take 1 tablet (10 mg total) by mouth daily. 30 tablet 2   [DISCONTINUED] ipratropium (ATROVENT) 0.06 % nasal spray Place 2 sprays into both nostrils 4 (four) times daily. 15 mL 0   No current facility-administered medications on file prior to visit.    No Known Allergies  Social History:  reports that she has never smoked. She has never used smokeless tobacco. She reports that she does not drink alcohol and does not use drugs.  Family History  Problem Relation Age of Onset   Asthma Mother    Hypertension Mother    CAD Mother    Asthma Maternal Grandmother    Hypertension Maternal Grandmother     The following portions of the patient's history were reviewed and updated as appropriate: allergies, current medications, past family history, past medical history, past social history, past surgical  history and problem list.  Review of Systems Pertinent items noted in HPI and remainder of comprehensive ROS otherwise negative.  Physical Exam:  BP 133/81   Ht 5\' 5"  (1.651 m)   Wt 176 lb (79.8 kg)   BMI 29.29 kg/m  CONSTITUTIONAL: Well-developed, well-nourished female in no acute distress.  HENT:  Normocephalic, atraumatic, External right and left ear normal.  EYES: Conjunctivae and EOM are normal. Pupils are equal, round, and reactive to light. No scleral icterus.  NECK: Normal range of motion, supple, no masses.  Normal thyroid.  SKIN: Skin is warm and dry. No rash noted. Not diaphoretic. No erythema. No pallor. MUSCULOSKELETAL: Normal range of motion. No tenderness.  No cyanosis, clubbing, or edema. NEUROLOGIC: Alert and oriented to person, place, and time. Normal reflexes, muscle tone coordination.  PSYCHIATRIC: Normal mood and affect. Normal behavior. Normal judgment and thought content. CARDIOVASCULAR: Normal heart rate noted, regular rhythm RESPIRATORY: Clear to auscultation bilaterally. Effort and breath sounds normal, no problems with respiration noted. BREASTS: Symmetric in size. No masses, tenderness, skin changes, nipple drainage, or lymphadenopathy bilaterally. Performed in the presence of a chaperone. ABDOMEN: Soft, no distention noted.  No tenderness, rebound or guarding.  PELVIC: Normal appearing external genitalia and urethral meatus; normal appearing vaginal mucosa and cervix.  No abnormal vaginal discharge noted.  Pap smear obtained.  Normal uterine size, no other palpable masses, no uterine or adnexal tenderness.  Performed in the presence of a chaperone.   Assessment and Plan:    1. Well woman exam with routine gynecological exam - No atypical findings on exam - Discussed guidelines for exercise most days of the week, be patient with herself - Cytology - PAP  2. Nexplanon removal - See separate procedure note - ibuprofen (ADVIL) 600 MG tablet; Take 1 tablet  (600 mg total) by mouth every 6 (six) hours as needed.  Dispense: 120 tablet; Refill: 0 - Informed Consent Details: Physician/Practitioner Attestation; Transcribe to consent form and obtain patient signature  3. Patient desires pregnancy - Discussed return of menstrual cycle 4-6 weeks after removal of Nexplanon - Can begin prenatals today in anticipation of return of fertility - prenatal vitamin w/FE, FA (PRENATAL 1 + 1) 27-1 MG TABS tablet; Take 1 tablet by mouth daily at 12 noon.  Dispense: 30 tablet; Refill: 11  Will follow up results of pap smear and manage accordingly. Routine preventative health maintenance measures emphasized. Please refer to After Visit Summary for  other counseling recommendations.      Mallie Snooks, [place], MSN, CNM Certified Nurse Midwife, Product/process development scientist for [employer], [place]

## 2022-02-10 NOTE — Progress Notes (Signed)
Patient presents for Annual Exam.  Last pap:09/16/19 WNL  Contraception:Nexplanon STD Screening: Declines   CC: None

## 2022-02-14 ENCOUNTER — Encounter: Payer: Self-pay | Admitting: Advanced Practice Midwife

## 2022-02-14 LAB — CYTOLOGY - PAP
Comment: NEGATIVE
Diagnosis: NEGATIVE
High risk HPV: NEGATIVE

## 2022-02-24 ENCOUNTER — Encounter: Payer: Self-pay | Admitting: Family

## 2022-03-07 ENCOUNTER — Encounter: Payer: Self-pay | Admitting: Family

## 2022-03-21 ENCOUNTER — Encounter: Payer: Self-pay | Admitting: Advanced Practice Midwife

## 2022-03-22 ENCOUNTER — Ambulatory Visit
Admission: EM | Admit: 2022-03-22 | Discharge: 2022-03-22 | Disposition: A | Discharge status: Home / Self Care | Payer: Medicaid Other | Attending: Internal Medicine | Admitting: Internal Medicine

## 2022-03-22 ENCOUNTER — Encounter: Payer: Self-pay | Admitting: Family

## 2022-03-22 DIAGNOSIS — R350 Frequency of micturition: Secondary | ICD-10-CM | POA: Insufficient documentation

## 2022-03-22 DIAGNOSIS — R1030 Lower abdominal pain, unspecified: Secondary | ICD-10-CM | POA: Insufficient documentation

## 2022-03-22 DIAGNOSIS — M545 Low back pain, unspecified: Secondary | ICD-10-CM | POA: Diagnosis not present

## 2022-03-22 LAB — POCT URINALYSIS DIP (MANUAL ENTRY)
Bilirubin, UA: NEGATIVE
Blood, UA: NEGATIVE
Glucose, UA: NEGATIVE mg/dL
Ketones, POC UA: NEGATIVE mg/dL
Leukocytes, UA: NEGATIVE
Nitrite, UA: NEGATIVE
Protein Ur, POC: NEGATIVE mg/dL
Spec Grav, UA: 1.02 (ref 1.010–1.025)
Urobilinogen, UA: 0.2 E.U./dL
pH, UA: 7.5 (ref 5.0–8.0)

## 2022-03-22 LAB — POCT URINE PREGNANCY: Preg Test, Ur: NEGATIVE

## 2022-03-22 NOTE — ED Triage Notes (Signed)
Pt. States for the last two days she has had a dull ache and cramping sensation in her back and abdomen, accompanied w/ urinary frequency. Pt. Also states she has some breast tenderness.

## 2022-03-22 NOTE — ED Provider Notes (Signed)
[name]    CSN: [id number] Arrival date & time: 03/22/22  1032      History   Chief Complaint Chief Complaint  Patient presents with   Urinary Frequency   Nausea   Abdominal Pain    HPI Curissa Galuska is a 41 y.o. female.   Patient presents with lower mild abdominal cramping in the mid abdomen with associated urinary frequency that started about 2 days ago.  Patient also reports that she has some right lower back pain that started around the same time.  She also reports some breast tenderness.  She reports some nausea without vomiting.  Denies diarrhea, constipation, blood in stool.  Patient having normal bowel movements.  Denies any associated abnormal vaginal bleeding, vaginal discharge, hematuria, dysuria, fever.  Denies any confirmed exposure to STD.  Patient reports the last menstrual cycle was 03/06/2022.  She is currently not using any type of birth control as she had her Nexplanon removed in July to attempt to have another baby.   Urinary Frequency  Abdominal Pain   Past Medical History:  Diagnosis Date   Gallstone    Headache(784.0)    Heart murmur    Hypertension    Infection    UTI   Maternal chronic hypertension in first trimester 04/09/2011   Pregnancy induced hypertension    after 2nd delivery   Vaginal Pap smear, abnormal    cant remember    Patient Active Problem List   Diagnosis Date Noted   Nexplanon removal 02/10/2022   Sore throat 11/01/2018   Depression affecting pregnancy 03/06/2018   Vitamin D deficiency 12/14/2017   Murmur, cardiac 11/30/2016   Anemia 11/30/2016   Migraine 03/18/2016   Subclinical hyperthyroidism 12/09/2015   Chronic hypertension with superimposed preeclampsia 04/09/2011    Past Surgical History:  Procedure Laterality Date   DILATION AND CURETTAGE OF UTERUS     dont remember year   LEEP      OB History     Gravida  9   Para  4   Term  2   Preterm  2   AB  5   Living  4      SAB  4    IAB  1   Ectopic  0   Multiple  0   Live Births  4            Home Medications    Prior to Admission medications   Medication Sig Start Date End Date Taking? Authorizing Provider  acetaminophen (TYLENOL) 500 MG tablet Take 500 mg by mouth every 6 (six) hours as needed. Patient not taking: Reported on 02/10/2022    [provider]  amLODipine (NORVASC) 10 MG tablet TAKE 1 TABLET(10 MG) BY MOUTH DAILY 12/31/21   Marrian Salvage, FNP  etonogestrel (NEXPLANON) 68 MG IMPL implant 1 each by Subdermal route once.    [provider]  fluticasone (FLONASE) 50 MCG/ACT nasal spray Place 1 spray into both nostrils daily for 3 days. 12/03/21 12/06/21  Teodora Medici, FNP  methocarbamol (ROBAXIN) 500 MG tablet Take 1 tablet (500 mg total) by mouth at bedtime as needed for muscle spasms. Patient not taking: Reported on 02/10/2022 01/03/22   Chase Picket, MD  Multiple Vitamin (MULTIVITAMIN WITH MINERALS) TABS tablet Take 1 tablet by mouth daily. Patient not taking: Reported on 02/10/2022    [provider]  naproxen (NAPROSYN) 375 MG tablet Take 1 tablet (375 mg total) by mouth 2 (two)  times daily. Patient not taking: Reported on 02/10/2022 01/03/22   Merrilee Jansky, MD  ondansetron (ZOFRAN) 4 MG tablet Take 1 tablet (4 mg total) by mouth every 6 (six) hours as needed for nausea or vomiting. Patient not taking: Reported on 02/10/2022 02/26/21   Gustavus Bryant, FNP  prenatal vitamin w/FE, FA (PRENATAL 1 + 1) 27-1 MG TABS tablet Take 1 tablet by mouth daily at 12 noon. 02/10/22 02/05/23  Calvert Cantor, CNM  cetirizine (ZYRTEC ALLERGY) 10 MG tablet Take 1 tablet (10 mg total) by mouth daily. 09/27/20 11/24/20  Rhys Martini, PA-C  ipratropium (ATROVENT) 0.06 % nasal spray Place 2 sprays into both nostrils 4 (four) times daily. 11/21/19 01/15/20  Belinda Fisher, PA-C    Family History Family History  Problem Relation Age of Onset   Asthma Mother    Hypertension  Mother    CAD Mother    Asthma Maternal Grandmother    Hypertension Maternal Grandmother     Social History Social History   Tobacco Use   Smoking status: Never   Smokeless tobacco: Never  Vaping Use   Vaping Use: Never used  Substance Use Topics   Alcohol use: No    Alcohol/week: 0.0 standard drinks of alcohol   Drug use: No     Allergies   Patient has no known allergies.   Review of Systems Review of Systems Per HPI  Physical Exam Triage Vital Signs ED Triage Vitals [03/22/22 1240]  Enc Vitals Group     BP (!) 151/82     Pulse Rate 67     Resp 16     Temp 98.2 F (36.8 C)     Temp src      SpO2 98 %     Weight      Height      Head Circumference      Peak Flow      Pain Score 8     Pain Loc      Pain Edu?      Excl. in GC?    No data found.  Updated Vital Signs BP (!) 151/82   Pulse 67   Temp 98.2 F (36.8 C)   Resp 16   LMP 03/06/2022 Comment: off birth control per Pt.  SpO2 98%   Visual Acuity Right Eye Distance:   Left Eye Distance:   Bilateral Distance:    Right Eye Near:   Left Eye Near:    Bilateral Near:     Physical Exam Constitutional:      General: She is not in acute distress.    Appearance: Normal appearance. She is not toxic-appearing or diaphoretic.  HENT:     Head: Normocephalic and atraumatic.  Eyes:     Extraocular Movements: Extraocular movements intact.     Conjunctiva/sclera: Conjunctivae normal.  Cardiovascular:     Rate and Rhythm: Normal rate and regular rhythm.     Pulses: Normal pulses.     Heart sounds: Normal heart sounds.  Pulmonary:     Effort: Pulmonary effort is normal. No respiratory distress.     Breath sounds: Normal breath sounds.  Abdominal:     General: Bowel sounds are normal. There is no distension.     Palpations: Abdomen is soft.  Genitourinary:    Comments: Deferred with shared decision-making.  Self swab performed. Musculoskeletal:       Back:     Comments: Tenderness to palpation  to right lower lumbar region.  No direct spinal tenderness, crepitus, step-off.  Neurological:     General: No focal deficit present.     Mental Status: She is alert and oriented to person, place, and time. Mental status is at baseline.  Psychiatric:        Mood and Affect: Mood normal.        Behavior: Behavior normal.        Thought Content: Thought content normal.        Judgment: Judgment normal.      UC Treatments / Results  Labs (all labs ordered are listed, but only abnormal results are displayed) Labs Reviewed  CBC  COMPREHENSIVE METABOLIC PANEL  BETA HCG QUANT (REF LAB)  POCT URINALYSIS DIP (MANUAL ENTRY)  POCT URINE PREGNANCY  CERVICOVAGINAL ANCILLARY ONLY    EKG   Radiology No results found.  Procedures Procedures (including critical care time)  Medications Ordered in UC Medications - No data to display  Initial Impression / Assessment and Plan / UC Course  I have reviewed the triage vital signs and the nursing notes.  Pertinent labs & imaging results that were available during my care of the patient were reviewed by me and considered in my medical decision making (see chart for details).     Urinalysis and urine pregnancy were negative.  Will obtain quantitative hCG to confirm given patient is attempting to get pregnant.  Will obtain CMP and CBC as well.  No concern for acute abdomen given that abdominal pain appears to be mild.  I am suspicious that patient's back pain and abdominal pain or different etiologies as patient's back pain appears to be musculoskeletal in etiology given pain is reproducible with palpation and given location of pain.  Advised patient to take Tylenol and apply ice to affected area of back.  Cervicovaginal swab pending to rule out vaginitis as cause of patient's lower abdominal cramping and discomfort given UA was normal.  Do not think that any additional imaging or emergent evaluation is necessary given physical exam but patient was  given strict return and ER precautions.  Also advised PCP follow-up.  Patient verbalized understanding and was agreeable with plan. Final Clinical Impressions(s) / UC Diagnoses   Final diagnoses:  Lower abdominal pain  Acute left-sided low back pain without sciatica  Urinary frequency     Discharge Instructions      Recommend Tylenol for discomfort.  Also recommend ice application.  Your urine was negative for infection.  Urine pregnancy was also negative.  Your blood work is pending for pregnancy as well as to detect any other abnormalities.  We will call if there are any abnormalities.  Please follow-up with emergency department if symptoms persist or worsen.  Follow-up with primary care doctor otherwise.    ED Prescriptions   None    PDMP not reviewed this encounter.   Gustavus Bryant, [place] 03/22/22 1348

## 2022-03-22 NOTE — Discharge Instructions (Signed)
Recommend Tylenol for discomfort.  Also recommend ice application.  Your urine was negative for infection.  Urine pregnancy was also negative.  Your blood work is pending for pregnancy as well as to detect any other abnormalities.  We will call if there are any abnormalities.  Please follow-up with emergency department if symptoms persist or worsen.  Follow-up with primary care doctor otherwise.

## 2022-03-23 ENCOUNTER — Encounter: Payer: Self-pay | Admitting: Family

## 2022-03-23 LAB — COMPREHENSIVE METABOLIC PANEL
ALT: 15 IU/L (ref 0–32)
AST: 22 IU/L (ref 0–40)
Albumin/Globulin Ratio: 1.5 (ref 1.2–2.2)
Albumin: 4.7 g/dL (ref 3.9–4.9)
Alkaline Phosphatase: 58 IU/L (ref 44–121)
BUN/Creatinine Ratio: 8 — ABNORMAL LOW (ref 9–23)
BUN: 6 mg/dL (ref 6–20)
Bilirubin Total: 0.3 mg/dL (ref 0.0–1.2)
CO2: 21 mmol/L (ref 20–29)
Calcium: 9.3 mg/dL (ref 8.7–10.2)
Chloride: 103 mmol/L (ref 96–106)
Creatinine, Ser: 0.79 mg/dL (ref 0.57–1.00)
Globulin, Total: 3.2 g/dL (ref 1.5–4.5)
Glucose: 83 mg/dL (ref 70–99)
Potassium: 3.7 mmol/L (ref 3.5–5.2)
Sodium: 139 mmol/L (ref 134–144)
Total Protein: 7.9 g/dL (ref 6.0–8.5)
eGFR: 98 mL/min/{1.73_m2} (ref >59)

## 2022-03-23 LAB — CERVICOVAGINAL ANCILLARY ONLY
Bacterial Vaginitis (gardnerella): NEGATIVE
Candida Glabrata: NEGATIVE
Candida Vaginitis: NEGATIVE
Chlamydia: NEGATIVE
Comment: NEGATIVE
Comment: NEGATIVE
Comment: NEGATIVE
Comment: NEGATIVE
Comment: NEGATIVE
Comment: NORMAL
Neisseria Gonorrhea: NEGATIVE
Trichomonas: NEGATIVE

## 2022-03-23 LAB — CBC
Hematocrit: 38.5 % (ref 34.0–46.6)
Hemoglobin: 12.9 g/dL (ref 11.1–15.9)
MCH: 28.7 pg (ref 26.6–33.0)
MCHC: 33.5 g/dL (ref 31.5–35.7)
MCV: 86 fL (ref 79–97)
Platelets: 299 10*3/uL (ref 150–450)
RBC: 4.5 x10E6/uL (ref 3.77–5.28)
RDW: 12.6 % (ref 11.7–15.4)
WBC: 7.8 10*3/uL (ref 3.4–10.8)

## 2022-03-23 LAB — BETA HCG QUANT (REF LAB): hCG Quant: <1 m[IU]/mL

## 2022-04-05 ENCOUNTER — Encounter: Payer: Self-pay | Admitting: Advanced Practice Midwife

## 2022-05-02 ENCOUNTER — Encounter: Payer: Self-pay | Admitting: Advanced Practice Midwife

## 2022-05-03 ENCOUNTER — Encounter: Payer: Self-pay | Admitting: Family

## 2022-05-03 ENCOUNTER — Inpatient Hospital Stay (HOSPITAL_COMMUNITY): Payer: Medicaid Other

## 2022-05-03 ENCOUNTER — Ambulatory Visit
Admission: EM | Admit: 2022-05-03 | Discharge: 2022-05-03 | Disposition: A | Discharge status: Home / Self Care | Payer: Medicaid Other | Attending: Internal Medicine | Admitting: Internal Medicine

## 2022-05-03 ENCOUNTER — Inpatient Hospital Stay (HOSPITAL_COMMUNITY)
Admission: AD | Admit: 2022-05-03 | Discharge: 2022-05-03 | Disposition: A | Discharge status: Home / Self Care | Payer: Medicaid Other | Attending: Obstetrics & Gynecology | Admitting: Obstetrics & Gynecology

## 2022-05-03 ENCOUNTER — Encounter (HOSPITAL_COMMUNITY): Payer: Self-pay | Admitting: Obstetrics & Gynecology

## 2022-05-03 DIAGNOSIS — R1030 Lower abdominal pain, unspecified: Secondary | ICD-10-CM

## 2022-05-03 DIAGNOSIS — Z3A01 Less than 8 weeks gestation of pregnancy: Secondary | ICD-10-CM | POA: Diagnosis present

## 2022-05-03 DIAGNOSIS — Z3201 Encounter for pregnancy test, result positive: Secondary | ICD-10-CM | POA: Diagnosis not present

## 2022-05-03 DIAGNOSIS — O039 Complete or unspecified spontaneous abortion without complication: Secondary | ICD-10-CM | POA: Insufficient documentation

## 2022-05-03 DIAGNOSIS — O3680X0 Pregnancy with inconclusive fetal viability, not applicable or unspecified: Secondary | ICD-10-CM | POA: Diagnosis not present

## 2022-05-03 DIAGNOSIS — O210 Mild hyperemesis gravidarum: Secondary | ICD-10-CM | POA: Diagnosis not present

## 2022-05-03 LAB — CBC
HCT: 36.8 % (ref 36.0–46.0)
Hemoglobin: 12.2 g/dL (ref 12.0–15.0)
MCH: 28.1 pg (ref 26.0–34.0)
MCHC: 33.2 g/dL (ref 30.0–36.0)
MCV: 84.8 fL (ref 80.0–100.0)
Platelets: 261 10*3/uL (ref 150–400)
RBC: 4.34 MIL/uL (ref 3.87–5.11)
RDW: 12.9 % (ref 11.5–15.5)
WBC: 8.2 10*3/uL (ref 4.0–10.5)
nRBC: 0 % (ref 0.0–0.2)

## 2022-05-03 LAB — URINALYSIS, ROUTINE W REFLEX MICROSCOPIC
Bilirubin Urine: NEGATIVE
Glucose, UA: NEGATIVE mg/dL
Hgb urine dipstick: NEGATIVE
Ketones, ur: NEGATIVE mg/dL
Nitrite: NEGATIVE
Protein, ur: NEGATIVE mg/dL
Specific Gravity, Urine: 1.013 (ref 1.005–1.030)
pH: 6 (ref 5.0–8.0)

## 2022-05-03 LAB — WET PREP, GENITAL
Sperm: NONE SEEN
Trich, Wet Prep: NONE SEEN
WBC, Wet Prep HPF POC: >=10 — AB (ref <10)

## 2022-05-03 LAB — HCG, QUANTITATIVE, PREGNANCY: hCG, Beta Chain, Quant, S: 39 m[IU]/mL — ABNORMAL HIGH (ref <5)

## 2022-05-03 LAB — POCT URINE PREGNANCY: Preg Test, Ur: POSITIVE — AB

## 2022-05-03 MED ORDER — PROMETHAZINE HCL 25 MG PO TABS: 12.5000 mg | ORAL_TABLET | Freq: Four times a day (QID) | ORAL | 0 refills | Status: DC | PRN

## 2022-05-03 MED ORDER — ACETAMINOPHEN 500 MG PO TABS
1000.0000 mg | ORAL_TABLET | Freq: Four times a day (QID) | ORAL | Status: DC | PRN
  Administered 2022-05-03: 1000 mg via ORAL
  Filled 2022-05-03: qty 2

## 2022-05-03 NOTE — ED Triage Notes (Signed)
Pt p resents to uc with co of nausea, vomiting, breast tenderness, back pain, and cramping for 2 days pt concerned for pregnancy.

## 2022-05-03 NOTE — MAU Provider Note (Signed)
History     CSN: [id number]  Arrival date and time: 05/03/22 1904   Event Date/Time   First Provider Initiated Contact with Patient 05/03/22 2023      Chief Complaint  Patient presents with   Abdominal Pain   41 y.o. [id number] @[redacted]w[redacted]d  by sure LMP presenting with abdominal cramping. Reports onset 2 days ago. Feels like menstrual cramps. Rates pain 8/10. Has not taken anything today. Denies urinary sx. Reports clear vaginal discharge, no itching or malodor. She is eating and drinking but has low appetite. Reports nausea and denies vomiting.     OB History     Gravida  10   Para  4   Term  2   Preterm  2   AB  5   Living  4      SAB  4   IAB  1   Ectopic  0   Multiple  0   Live Births  4           Past Medical History:  Diagnosis Date   Gallstone    Headache(784.0)    Heart murmur    Hypertension    Infection    UTI   Maternal chronic hypertension in first trimester 04/09/2011   Pregnancy induced hypertension    after 2nd delivery   Vaginal Pap smear, abnormal    cant remember    Past Surgical History:  Procedure Laterality Date   DILATION AND CURETTAGE OF UTERUS     dont remember year   LEEP      Family History  Problem Relation Age of Onset   Asthma Mother    Hypertension Mother    CAD Mother    Asthma Maternal Grandmother    Hypertension Maternal Grandmother     Social History   Tobacco Use   Smoking status: Never   Smokeless tobacco: Never  Vaping Use   Vaping Use: Never used  Substance Use Topics   Alcohol use: No    Alcohol/week: 0.0 standard drinks of alcohol   Drug use: No    Allergies: No Known Allergies  Medications Prior to Admission  Medication Sig Dispense Refill Last Dose   amLODipine (NORVASC) 10 MG tablet TAKE 1 TABLET(10 MG) BY MOUTH DAILY 90 tablet 2 05/03/2022   prenatal vitamin w/FE, FA (PRENATAL 1 + 1) 27-1 MG TABS tablet Take 1 tablet by mouth daily at 12 noon. 30 tablet 11 05/03/2022   acetaminophen  (TYLENOL) 500 MG tablet Take 500 mg by mouth every 6 (six) hours as needed. (Patient not taking: Reported on 02/10/2022)      etonogestrel (NEXPLANON) 68 MG IMPL implant 1 each by Subdermal route once.      fluticasone (FLONASE) 50 MCG/ACT nasal spray Place 1 spray into both nostrils daily for 3 days. 16 g 0    methocarbamol (ROBAXIN) 500 MG tablet Take 1 tablet (500 mg total) by mouth at bedtime as needed for muscle spasms. (Patient not taking: Reported on 02/10/2022) 20 tablet 0    Multiple Vitamin (MULTIVITAMIN WITH MINERALS) TABS tablet Take 1 tablet by mouth daily. (Patient not taking: Reported on 02/10/2022)      naproxen (NAPROSYN) 375 MG tablet Take 1 tablet (375 mg total) by mouth 2 (two) times daily. (Patient not taking: Reported on 02/10/2022) 20 tablet 0    ondansetron (ZOFRAN) 4 MG tablet Take 1 tablet (4 mg total) by mouth every 6 (six) hours as needed for nausea or vomiting. (Patient not taking: Reported on  02/10/2022) 12 tablet 0     Review of Systems  Constitutional:  Negative for fever.  Gastrointestinal:  Positive for abdominal pain and nausea. Negative for constipation, diarrhea and vomiting.  Genitourinary:  Positive for vaginal discharge. Negative for dysuria, hematuria and vaginal bleeding.   Physical Exam   Blood pressure (!) 131/91, pulse 70, temperature 98.5 F (36.9 C), resp. rate 17, height 5\' 5"  (1.651 m), weight 79.4 kg, last menstrual period 04/07/2022, SpO2 100 %, currently breastfeeding.  Physical Exam Vitals and nursing note reviewed.  Constitutional:      General: She is not in acute distress.    Appearance: Normal appearance.  HENT:     Head: Normocephalic.  Cardiovascular:     Rate and Rhythm: Normal rate.  Pulmonary:     Effort: Pulmonary effort is normal. No respiratory distress.  Abdominal:     General: There is no distension.     Palpations: Abdomen is soft. There is no mass.     Tenderness: There is no abdominal tenderness. There is no guarding or  rebound.     Hernia: No hernia is present.  Musculoskeletal:        General: Normal range of motion.     Cervical back: Normal range of motion.  Skin:    General: Skin is warm and dry.  Neurological:     General: No focal deficit present.     Mental Status: She is alert and oriented to person, place, and time.  Psychiatric:        Mood and Affect: Mood normal.        Behavior: Behavior normal.    Results for orders placed or performed during the hospital encounter of 05/03/22 (from the past 24 hour(s))  Urinalysis, Routine w reflex microscopic Urine, Clean Catch     Status: Abnormal   Collection Time: 05/03/22  7:24 PM  Result Value Ref Range   Color, Urine YELLOW YELLOW   APPearance HAZY (A) CLEAR   Specific Gravity, Urine 1.013 1.005 - 1.030   pH 6.0 5.0 - 8.0   Glucose, UA NEGATIVE NEGATIVE mg/dL   Hgb urine dipstick NEGATIVE NEGATIVE   Bilirubin Urine NEGATIVE NEGATIVE   Ketones, ur NEGATIVE NEGATIVE mg/dL   Protein, ur NEGATIVE NEGATIVE mg/dL   Nitrite NEGATIVE NEGATIVE   Leukocytes,Ua TRACE (A) NEGATIVE   RBC / HPF 0-5 0 - 5 RBC/hpf   WBC, UA 0-5 0 - 5 WBC/hpf   Bacteria, UA RARE (A) NONE SEEN   Squamous Epithelial / LPF 6-10 0 - 5   Mucus PRESENT   CBC     Status: None   Collection Time: 05/03/22  8:22 PM  Result Value Ref Range   WBC 8.2 4.0 - 10.5 K/uL   RBC 4.34 3.87 - 5.11 MIL/uL   Hemoglobin 12.2 12.0 - 15.0 g/dL   HCT 05/05/22 70.9 - 62.8 %   MCV 84.8 80.0 - 100.0 fL   MCH 28.1 26.0 - 34.0 pg   MCHC 33.2 30.0 - 36.0 g/dL   RDW 36.6 29.4 - 76.5 %   Platelets 261 150 - 400 K/uL   nRBC 0.0 0.0 - 0.2 %  hCG, quantitative, pregnancy     Status: Abnormal   Collection Time: 05/03/22  8:22 PM  Result Value Ref Range   hCG, Beta Chain, Quant, S 39 (H) <5 mIU/mL  Wet prep, genital     Status: Abnormal   Collection Time: 05/03/22  8:50 PM   Specimen: PATH Cytology  Cervicovaginal Ancillary Only  Result Value Ref Range   Yeast Wet Prep HPF POC PRESENT (A) NONE SEEN    Trich, Wet Prep NONE SEEN NONE SEEN   Clue Cells Wet Prep HPF POC PRESENT (A) NONE SEEN   WBC, Wet Prep HPF POC >=10 (A) <10   Sperm NONE SEEN    US OB LESS THAN 14 WEEKS WITH OB TRANSVAGINAL  Result Date: 05/03/2022 CLINICAL DATA:  Abdominal pain. EXAM: OBSTETRIC <14 WK [place] AND TRANSVAGINAL OB US TECHNIQUE: Both transabdominal and transvaginal ultrasound examinations were performed for complete evaluation of the gestation as well as the maternal uterus, adnexal regions, and pelvic cul-de-sac. Transvaginal technique was performed to assess early pregnancy. COMPARISON:  None Available. FINDINGS: Intrauterine gestational sac: None Endometrium: Appears normal measuring 1 cm in thickness. Maternal uterus/adnexae: The bilateral ovaries are visualized and appear within normal limits. Corpus luteum is noted in the right ovary. There is a small amount of free fluid in the pelvis. IMPRESSION: 1. No intrauterine gestational sac identified. No adnexal mass or significant free fluid. In the setting of a positive pregnancy test findings may related to early normal IUP, completed abortion or occult ectopic pregnancy. Recommend clinical correlation and follow-up. Electronically Signed   By: Ronney Asters M.D.   On: 05/03/2022 21:32    MAU Course  Procedures  MDM Labs and [place] ordered and reviewed. No IUGS, YS or FP seen on [place], findings could indicate early pregnancy, ectopic pregnancy, or failed pregnancy, discussed with pt. Will follow quant in 48 hrs. Stable for discharge home.   Assessment and Plan   1. Pregnancy, location unknown   2. Morning sickness    Discharge home Follow [facility] on 10/20 SAB/ectopic precautions Rx Phenergan  Allergies as of 05/03/2022   No Known Allergies      Medication List     STOP taking these medications    methocarbamol 500 MG tablet Commonly known as: ROBAXIN   multivitamin with minerals Tabs tablet   naproxen 375 MG tablet Commonly known as: NAPROSYN    Nexplanon 68 MG Impl implant Generic drug: etonogestrel   ondansetron 4 MG tablet Commonly known as: ZOFRAN       TAKE these medications    acetaminophen 500 MG tablet Commonly known as: TYLENOL Take 500 mg by mouth every 6 (six) hours as needed.   amLODipine 10 MG tablet Commonly known as: NORVASC TAKE 1 TABLET(10 MG) BY MOUTH DAILY   fluticasone 50 MCG/ACT nasal spray Commonly known as: FLONASE Place 1 spray into both nostrils daily for 3 days.   prenatal vitamin w/FE, FA 27-1 MG Tabs tablet Take 1 tablet by mouth daily at 12 noon.   promethazine 25 MG tablet Commonly known as: PHENERGAN Take 0.5-1 tablets (12.5-25 mg total) by mouth every 6 (six) hours as needed for nausea or vomiting.         Julianne Handler, CNM 05/03/2022, 9:59 PM

## 2022-05-03 NOTE — ED Provider Notes (Signed)
[name]    CSN: [id number] Arrival date & time: 05/03/22  1733      History   Chief Complaint Chief Complaint  Patient presents with   Abdominal Pain    HPI Brittany [name] is a 41 y.o. female.   Patient presents today to receive a pregnancy test.  She reports that she been having nausea, vomiting, breast tenderness, lower back pain, abdominal cramping for 2 days.  Her last menstrual cycle was 04/07/2022.  She also reports some clear vaginal discharge.  She states that she is not able to characterize her abdominal pain but reports it is more severe than menstrual cramps and that she is very concerned about it as she has not had this type of pain with previous pregnancies.   Abdominal Pain   Past Medical History:  Diagnosis Date   Gallstone    Headache(784.0)    Heart murmur    Hypertension    Infection    UTI   Maternal chronic hypertension in first trimester 04/09/2011   Pregnancy induced hypertension    after 2nd delivery   Vaginal Pap smear, abnormal    cant remember    Patient Active Problem List   Diagnosis Date Noted   Nexplanon removal 02/10/2022   Sore throat 11/01/2018   Depression affecting pregnancy 03/06/2018   Vitamin D deficiency 12/14/2017   Murmur, cardiac 11/30/2016   Anemia 11/30/2016   Migraine 03/18/2016   Subclinical hyperthyroidism 12/09/2015   Chronic hypertension with superimposed preeclampsia 04/09/2011    Past Surgical History:  Procedure Laterality Date   DILATION AND CURETTAGE OF UTERUS     dont remember year   LEEP      OB History     Gravida  9   Para  4   Term  2   Preterm  2   AB  5   Living  4      SAB  4   IAB  1   Ectopic  0   Multiple  0   Live Births  4            Home Medications    Prior to Admission medications   Medication Sig Start Date End Date Taking? Authorizing Provider  acetaminophen (TYLENOL) 500 MG tablet Take 500 mg by mouth every 6 (six) hours as  needed. Patient not taking: Reported on 02/10/2022    [provider]  amLODipine (NORVASC) 10 MG tablet TAKE 1 TABLET(10 MG) BY MOUTH DAILY 12/31/21   Marrian Salvage, FNP  etonogestrel (NEXPLANON) 68 MG IMPL implant 1 each by Subdermal route once.    [provider]  fluticasone (FLONASE) 50 MCG/ACT nasal spray Place 1 spray into both nostrils daily for 3 days. 12/03/21 12/06/21  Teodora Medici, FNP  methocarbamol (ROBAXIN) 500 MG tablet Take 1 tablet (500 mg total) by mouth at bedtime as needed for muscle spasms. Patient not taking: Reported on 02/10/2022 01/03/22   Chase Picket, MD  Multiple Vitamin (MULTIVITAMIN WITH MINERALS) TABS tablet Take 1 tablet by mouth daily. Patient not taking: Reported on 02/10/2022    [provider]  naproxen (NAPROSYN) 375 MG tablet Take 1 tablet (375 mg total) by mouth 2 (two) times daily. Patient not taking: Reported on 02/10/2022 01/03/22   Chase Picket, MD  ondansetron (ZOFRAN) 4 MG tablet Take 1 tablet (4 mg total) by mouth every 6 (six) hours as needed for nausea or vomiting. Patient not taking: Reported on 02/10/2022  02/26/21   Teodora Medici, FNP  prenatal vitamin w/FE, FA (PRENATAL 1 + 1) 27-1 MG TABS tablet Take 1 tablet by mouth daily at 12 noon. 02/10/22 02/05/23  Darlina Rumpf, CNM  cetirizine (ZYRTEC ALLERGY) 10 MG tablet Take 1 tablet (10 mg total) by mouth daily. 09/27/20 11/24/20  Hazel Sams, PA-C  ipratropium (ATROVENT) 0.06 % nasal spray Place 2 sprays into both nostrils 4 (four) times daily. 11/21/19 01/15/20  Ok Edwards, PA-C    Family History Family History  Problem Relation Age of Onset   Asthma Mother    Hypertension Mother    CAD Mother    Asthma Maternal Grandmother    Hypertension Maternal Grandmother     Social History Social History   Tobacco Use   Smoking status: Never   Smokeless tobacco: Never  Vaping Use   Vaping Use: Never used  Substance Use Topics   Alcohol use: No     Alcohol/week: 0.0 standard drinks of alcohol   Drug use: No     Allergies   Patient has no known allergies.   Review of Systems Review of Systems Per HPI  Physical Exam Triage Vital Signs ED Triage Vitals  Enc Vitals Group     BP 05/03/22 1832 (!) 148/91     Pulse Rate 05/03/22 1832 79     Resp 05/03/22 1832 18     Temp 05/03/22 1832 98.7 F (37.1 C)     Temp src --      SpO2 05/03/22 1832 99 %     Weight --      Height --      Head Circumference --      Peak Flow --      Pain Score 05/03/22 1830 4     Pain Loc --      Pain Edu? --      Excl. in [place]? --    No data found.  Updated Vital Signs BP (!) 148/91   Pulse 79   Temp 98.7 F (37.1 C)   Resp 18   LMP 04/06/2022 (Exact Date)   SpO2 99%   Visual Acuity Right Eye Distance:   Left Eye Distance:   Bilateral Distance:    Right Eye Near:   Left Eye Near:    Bilateral Near:     Physical Exam Constitutional:      General: She is not in acute distress.    Appearance: Normal appearance. She is not toxic-appearing or diaphoretic.  HENT:     Head: Normocephalic and atraumatic.  Eyes:     Extraocular Movements: Extraocular movements intact.     Conjunctiva/sclera: Conjunctivae normal.  Cardiovascular:     Rate and Rhythm: Normal rate and regular rhythm.     Pulses: Normal pulses.     Heart sounds: Normal heart sounds.  Pulmonary:     Effort: Pulmonary effort is normal. No respiratory distress.     Breath sounds: Normal breath sounds.  Abdominal:     General: Bowel sounds are normal. There is no distension.     Palpations: Abdomen is soft.  Neurological:     General: No focal deficit present.     Mental Status: She is alert and oriented to person, place, and time. Mental status is at baseline.  Psychiatric:        Mood and Affect: Mood normal.        Behavior: Behavior normal.        Thought Content: Thought  content normal.        Judgment: Judgment normal.      UC Treatments / Results   Labs (all labs ordered are listed, but only abnormal results are displayed) Labs Reviewed  POCT URINE PREGNANCY - Abnormal; Notable for the following components:      Result Value   Preg Test, Ur Positive (*)    All other components within normal limits    EKG   Radiology No results found.  Procedures Procedures (including critical care time)  Medications Ordered in UC Medications - No data to display  Initial Impression / Assessment and Plan / UC Course  I have reviewed the triage vital signs and the nursing notes.  Pertinent labs & imaging results that were available during my care of the patient were reviewed by me and considered in my medical decision making (see chart for details).     Urine pregnancy test was positive.  Patient is very concerned about her abdominal pain and reports that is more significant than previous pregnancies.  This is concerning and I do think that further evaluation by maternity assessment unit is warranted due to this.  Patient was advised to go to the MAU at [facility] for further evaluation and management to be adequately assessed.  Patient was agreeable with plan.  Vital signs and patient stable at discharge.  Agree with patient self transport to the hospital. Final Clinical Impressions(s) / UC Diagnoses   Final diagnoses:  Positive urine pregnancy test  Lower abdominal pain     Discharge Instructions      Go to the maternity assessment unit at [facility] for further evaluation and management as soon as you leave urgent care.    ED Prescriptions   None    PDMP not reviewed this encounter.   Teodora Medici, [place] 05/03/22 1902

## 2022-05-03 NOTE — MAU Note (Addendum)
.  Brittany [name] is a 41 y.o. at [redacted]w[redacted]d here in MAU reporting abdominal cramping for days. Denies VB. Was seen at Urgent Care tonight and sent here due to abd pain. Has not taken meds for pain LMP: 04/07/22 Onset of complaint: 2 days Pain score: 8 Vitals:   05/03/22 1913 05/03/22 1915  BP:  (!) 131/91  Pulse: 70   Resp: 17   Temp: 98.5 F (36.9 C)   SpO2: 100%      FHT:n/a Lab orders placed from triage:  u/a

## 2022-05-03 NOTE — Discharge Instructions (Signed)
Go to the maternity assessment unit at [facility] for further evaluation and management as soon as you leave urgent care.

## 2022-05-04 LAB — GC/CHLAMYDIA PROBE AMP (~~LOC~~) NOT AT ARMC
Chlamydia: NEGATIVE
Comment: NEGATIVE
Comment: NORMAL
Neisseria Gonorrhea: NEGATIVE

## 2022-05-06 ENCOUNTER — Ambulatory Visit: Payer: Medicaid Other

## 2022-05-06 ENCOUNTER — Other Ambulatory Visit (HOSPITAL_COMMUNITY)
Admission: RE | Admit: 2022-05-06 | Discharge: 2022-05-06 | Disposition: A | Discharge status: Home / Self Care | Payer: Medicaid Other | Source: Ambulatory Visit | Attending: Obstetrics & Gynecology | Admitting: Obstetrics & Gynecology

## 2022-05-06 ENCOUNTER — Inpatient Hospital Stay (HOSPITAL_COMMUNITY)
Admission: AD | Admit: 2022-05-06 | Discharge: 2022-05-06 | Disposition: A | Discharge status: Home / Self Care | Payer: Medicaid Other | Attending: Obstetrics & Gynecology | Admitting: Obstetrics & Gynecology

## 2022-05-06 DIAGNOSIS — O3680X0 Pregnancy with inconclusive fetal viability, not applicable or unspecified: Secondary | ICD-10-CM | POA: Diagnosis not present

## 2022-05-06 LAB — HCG, QUANTITATIVE, PREGNANCY: hCG, Beta Chain, Quant, S: 202 m[IU]/mL — ABNORMAL HIGH (ref <5)

## 2022-05-06 NOTE — Discharge Instructions (Signed)

## 2022-05-06 NOTE — MAU Provider Note (Signed)
Event Date/Time  First Provider Initiated Contact with Patient 05/06/22 1008     Brittany [name] is a 41 y.o. [id number] patient who presents to MAU today for repeat stat Quant hCG Brittany/p evaluation in MAU on 05/03/2022. She reports ongoing abdominal cramping and low back pain, unchanged since onset around 05/01/2022  O BP 122/76   Pulse 74   Temp 98.7 F (37.1 C)   Resp 18   Ht 5\' 5"  (1.651 m)   Wt 79.3 kg   LMP 04/07/2022 (Exact Date)   BMI 29.09 kg/m    Physical Exam Vitals and nursing note reviewed. Exam conducted with a chaperone present.  Cardiovascular:     Rate and Rhythm: Normal rate.  Pulmonary:     Effort: Pulmonary effort is normal.  Skin:    Capillary Refill: Capillary refill takes less than 2 seconds.  Neurological:     Mental Status: She is alert and oriented to person, place, and time.  Psychiatric:        Mood and Affect: Mood normal.        Behavior: Behavior normal.        Thought Content: Thought content normal.        Judgment: Judgment normal.    A Medical screening exam complete Appropriate rise in quant hCG  Component     Latest Ref Rng 05/03/2022 05/06/2022  HCG, Beta Chain, Quant, Brittany     <5 mIU/mL 39 (H)  202 (H)      P Discharge from MAU in stable condition with ectopic precautions Warning signs for worsening condition that would warrant emergency follow-up discussed Patient may return to MAU as needed   Follow up Order placed for repeat viability ultrasound in about two weeks  Mallie Snooks, [place], MSN, CNM 05/06/2022 1:13 PM

## 2022-05-06 NOTE — MAU Note (Signed)
Pt here for repeat BHCG. Stated she still is having cramping and back pain 7/10. Denies any vag bleeding or discharge.

## 2022-05-09 ENCOUNTER — Encounter: Payer: Self-pay | Admitting: Advanced Practice Midwife

## 2022-05-09 ENCOUNTER — Inpatient Hospital Stay (HOSPITAL_COMMUNITY)
Admission: AD | Admit: 2022-05-09 | Discharge: 2022-05-09 | Disposition: A | Discharge status: Home / Self Care | Payer: Medicaid Other | Attending: Obstetrics & Gynecology | Admitting: Obstetrics & Gynecology

## 2022-05-09 ENCOUNTER — Inpatient Hospital Stay (HOSPITAL_COMMUNITY): Payer: Medicaid Other

## 2022-05-09 DIAGNOSIS — M549 Dorsalgia, unspecified: Secondary | ICD-10-CM | POA: Diagnosis not present

## 2022-05-09 DIAGNOSIS — O3680X0 Pregnancy with inconclusive fetal viability, not applicable or unspecified: Secondary | ICD-10-CM

## 2022-05-09 DIAGNOSIS — O26891 Other specified pregnancy related conditions, first trimester: Secondary | ICD-10-CM | POA: Diagnosis present

## 2022-05-09 DIAGNOSIS — O09521 Supervision of elderly multigravida, first trimester: Secondary | ICD-10-CM | POA: Insufficient documentation

## 2022-05-09 DIAGNOSIS — R109 Unspecified abdominal pain: Secondary | ICD-10-CM | POA: Diagnosis not present

## 2022-05-09 DIAGNOSIS — Z3A01 Less than 8 weeks gestation of pregnancy: Secondary | ICD-10-CM | POA: Insufficient documentation

## 2022-05-09 LAB — CBC
HCT: 34.9 % — ABNORMAL LOW (ref 36.0–46.0)
Hemoglobin: 11.6 g/dL — ABNORMAL LOW (ref 12.0–15.0)
MCH: 28.3 pg (ref 26.0–34.0)
MCHC: 33.2 g/dL (ref 30.0–36.0)
MCV: 85.1 fL (ref 80.0–100.0)
Platelets: 270 10*3/uL (ref 150–400)
RBC: 4.1 MIL/uL (ref 3.87–5.11)
RDW: 13 % (ref 11.5–15.5)
WBC: 9.7 10*3/uL (ref 4.0–10.5)
nRBC: 0 % (ref 0.0–0.2)

## 2022-05-09 LAB — COMPREHENSIVE METABOLIC PANEL
ALT: 17 U/L (ref 0–44)
AST: 23 U/L (ref 15–41)
Albumin: 3.8 g/dL (ref 3.5–5.0)
Alkaline Phosphatase: 47 U/L (ref 38–126)
Anion gap: 10 (ref 5–15)
BUN: 9 mg/dL (ref 6–20)
CO2: 22 mmol/L (ref 22–32)
Calcium: 9.1 mg/dL (ref 8.9–10.3)
Chloride: 106 mmol/L (ref 98–111)
Creatinine, Ser: 0.82 mg/dL (ref 0.44–1.00)
GFR, Estimated: >60 mL/min (ref >60)
Glucose, Bld: 82 mg/dL (ref 70–99)
Potassium: 3.3 mmol/L — ABNORMAL LOW (ref 3.5–5.1)
Sodium: 138 mmol/L (ref 135–145)
Total Bilirubin: 0.6 mg/dL (ref 0.3–1.2)
Total Protein: 7.2 g/dL (ref 6.5–8.1)

## 2022-05-09 LAB — URINALYSIS, ROUTINE W REFLEX MICROSCOPIC
Bilirubin Urine: NEGATIVE
Glucose, UA: NEGATIVE mg/dL
Hgb urine dipstick: NEGATIVE
Ketones, ur: NEGATIVE mg/dL
Leukocytes,Ua: NEGATIVE
Nitrite: NEGATIVE
Protein, ur: NEGATIVE mg/dL
Specific Gravity, Urine: 1.021 (ref 1.005–1.030)
pH: 5 (ref 5.0–8.0)

## 2022-05-09 LAB — HCG, QUANTITATIVE, PREGNANCY: hCG, Beta Chain, Quant, S: 1104 m[IU]/mL — ABNORMAL HIGH (ref <5)

## 2022-05-09 MED ORDER — ACETAMINOPHEN 325 MG PO TABS
650.0000 mg | ORAL_TABLET | Freq: Once | ORAL | Status: AC
  Administered 2022-05-09: 650 mg via ORAL
  Filled 2022-05-09: qty 2

## 2022-05-09 MED ORDER — CYCLOBENZAPRINE HCL 5 MG PO TABS
10.0000 mg | ORAL_TABLET | Freq: Once | ORAL | Status: AC
  Administered 2022-05-09: 10 mg via ORAL
  Filled 2022-05-09: qty 2

## 2022-05-09 NOTE — MAU Provider Note (Signed)
History     CSN: [id number]  Arrival date and time: 05/09/22 1826   Event Date/Time   First Provider Initiated Contact with Patient 05/09/22 2019      Chief Complaint  Patient presents with   Abdominal Pain   Brittany [name] is a 41 y.o. [id number] at [redacted]w[redacted]d by Definite LMP of Sept 21, 2023 who receives care at [facility].  She presents today for Abdominal Pain.  She states the pain is cramping and is constant.  She endorses that the pain started on Sunday and states "it eased up" with tylenol dosing after onset.  She did not take any tylenol today and rates the pain a 8/10.  She reports the pain is worse with sitting, but has no improving factors. She also reports back pain that is in the lower back and she describes it as burning sensation that is constant.    She reports she last ate one hour ago; chicken salad and drank pepsi 10-15 minutes ago.    OB History     Gravida  10   Para  4   Term  2   Preterm  2   AB  5   Living  4      SAB  4   IAB  1   Ectopic  0   Multiple  0   Live Births  4           Past Medical History:  Diagnosis Date   Gallstone    Headache(784.0)    Heart murmur    Hypertension    Infection    UTI   Maternal chronic hypertension in first trimester 04/09/2011   Pregnancy induced hypertension    after 2nd delivery   Vaginal Pap smear, abnormal    cant remember    Past Surgical History:  Procedure Laterality Date   DILATION AND CURETTAGE OF UTERUS     dont remember year   LEEP      Family History  Problem Relation Age of Onset   Asthma Mother    Hypertension Mother    CAD Mother    Asthma Maternal Grandmother    Hypertension Maternal Grandmother     Social History   Tobacco Use   Smoking status: Never   Smokeless tobacco: Never  Vaping Use   Vaping Use: Never used  Substance Use Topics   Alcohol use: No    Alcohol/week: 0.0 standard drinks of alcohol   Drug use: No    Allergies: No Known  Allergies  Medications Prior to Admission  Medication Sig Dispense Refill Last Dose   acetaminophen (TYLENOL) 500 MG tablet Take 500 mg by mouth every 6 (six) hours as needed. (Patient not taking: Reported on 02/10/2022)      amLODipine (NORVASC) 10 MG tablet TAKE 1 TABLET(10 MG) BY MOUTH DAILY 90 tablet 2    fluticasone (FLONASE) 50 MCG/ACT nasal spray Place 1 spray into both nostrils daily for 3 days. 16 g 0    prenatal vitamin w/FE, FA (PRENATAL 1 + 1) 27-1 MG TABS tablet Take 1 tablet by mouth daily at 12 noon. 30 tablet 11    promethazine (PHENERGAN) 25 MG tablet Take 0.5-1 tablets (12.5-25 mg total) by mouth every 6 (six) hours as needed for nausea or vomiting. 30 tablet 0     Review of Systems  Gastrointestinal:  Positive for abdominal pain and nausea. Negative for constipation, diarrhea and vomiting.  Genitourinary:  Negative for difficulty urinating, dysuria, vaginal bleeding  and vaginal discharge.  Neurological:  Negative for dizziness, light-headedness and headaches.   Physical Exam   Blood pressure (!) 142/80, pulse 87, temperature 98.9 F (37.2 C), temperature source Oral, resp. rate 16, height 5\' 5"  (1.651 m), weight 80.4 kg, last menstrual period 04/07/2022, currently breastfeeding.  Physical Exam Vitals reviewed.  Constitutional:      Appearance: She is well-developed.  HENT:     Head: Normocephalic and atraumatic.  Eyes:     Conjunctiva/sclera: Conjunctivae normal.  Cardiovascular:     Rate and Rhythm: Normal rate.  Pulmonary:     Effort: Pulmonary effort is normal. No respiratory distress.  Abdominal:     Tenderness: There is abdominal tenderness in the right lower quadrant, periumbilical area and suprapubic area.  Musculoskeletal:        General: Normal range of motion.     Cervical back: Normal range of motion.  Skin:    General: Skin is warm and dry.  Neurological:     Mental Status: She is alert and oriented to person, place, and time.  Psychiatric:         Mood and Affect: Mood normal.        Behavior: Behavior normal.    MAU Course  Procedures Results for orders placed or performed during the hospital encounter of 05/09/22 (from the past 24 hour(s))  Urinalysis, Routine w reflex microscopic Urine, Clean Catch     Status: Abnormal   Collection Time: 05/09/22  7:39 PM  Result Value Ref Range   Color, Urine YELLOW YELLOW   APPearance HAZY (A) CLEAR   Specific Gravity, Urine 1.021 1.005 - 1.030   pH 5.0 5.0 - 8.0   Glucose, UA NEGATIVE NEGATIVE mg/dL   Hgb urine dipstick NEGATIVE NEGATIVE   Bilirubin Urine NEGATIVE NEGATIVE   Ketones, ur NEGATIVE NEGATIVE mg/dL   Protein, ur NEGATIVE NEGATIVE mg/dL   Nitrite NEGATIVE NEGATIVE   Leukocytes,Ua NEGATIVE NEGATIVE  hCG, quantitative, pregnancy     Status: Abnormal   Collection Time: 05/09/22  8:32 PM  Result Value Ref Range   hCG, Beta Chain, Quant, S 1,104 (H) <5 mIU/mL  Comprehensive metabolic panel     Status: Abnormal   Collection Time: 05/09/22  8:32 PM  Result Value Ref Range   Sodium 138 135 - 145 mmol/L   Potassium 3.3 (L) 3.5 - 5.1 mmol/L   Chloride 106 98 - 111 mmol/L   CO2 22 22 - 32 mmol/L   Glucose, Bld 82 70 - 99 mg/dL   BUN 9 6 - 20 mg/dL   Creatinine, Ser 0.82 0.44 - 1.00 mg/dL   Calcium 9.1 8.9 - 10.3 mg/dL   Total Protein 7.2 6.5 - 8.1 g/dL   Albumin 3.8 3.5 - 5.0 g/dL   AST 23 15 - 41 U/L   ALT 17 0 - 44 U/L   Alkaline Phosphatase 47 38 - 126 U/L   Total Bilirubin 0.6 0.3 - 1.2 mg/dL   GFR, Estimated >60 >60 mL/min   Anion gap 10 5 - 15  CBC     Status: Abnormal   Collection Time: 05/09/22  8:32 PM  Result Value Ref Range   WBC 9.7 4.0 - 10.5 K/uL   RBC 4.10 3.87 - 5.11 MIL/uL   Hemoglobin 11.6 (L) 12.0 - 15.0 g/dL   HCT 34.9 (L) 36.0 - 46.0 %   MCV 85.1 80.0 - 100.0 fL   MCH 28.3 26.0 - 34.0 pg   MCHC 33.2 30.0 - 36.0  g/dL   RDW 13.0 11.5 - 15.5 %   Platelets 270 150 - 400 K/uL   nRBC 0.0 0.0 - 0.2 %   US OB Transvaginal  Result Date:  05/09/2022 CLINICAL DATA:  Cramps. EXAM: TRANSVAGINAL OB ULTRASOUND TECHNIQUE: Transvaginal ultrasound was performed for complete evaluation of the gestation as well as the maternal uterus, adnexal regions, and pelvic cul-de-sac. COMPARISON:  May 03, 2022 FINDINGS: Intrauterine gestational sac: None Yolk sac:  Not Visualized. Embryo:  Not Visualized. Cardiac Activity: Not Visualized. Heart Rate: N/A bpm Maternal uterus/adnexae: The endometrium measures 2.7 cm in thickness within the region near the uterine fundus. This is increased in size when compared to the prior study (measured 1.0 cm on the prior exam). The right ovary measures 3.5 cm x 2.0 cm x 2.6 cm and is normal in appearance. The left ovary is not visualized. No pelvic free fluid is seen. IMPRESSION: 1. Focal endometrial thickening, as described above, without evidence of an intrauterine pregnancy. Electronically Signed   By: Virgina Norfolk M.D.   On: 05/09/2022 21:32    MDM Physical Exam Labs: UA, CBC, hCG, CMP Ultrasound Assessment and Plan  41 year old [id number] at 4.4 weeks Abdominal Pain  -POC Reviewed -Exam performed -Discussed concern for ectopic pregnancy. -Repeat Labs -Send for [place] -Patient instructed to withhold from consumption of food and drink until after completion of visit.  -Discussed pain medication and patient agreeable. Give flexeril and tylenol. -Send and await [place] results.   Maryann Conners 05/09/2022, 8:19 PM   Reassessment (10:20 PM)  -Provider to bedside to discuss results. -Informed of need for repeat hCG in 48 hours. -Patient reports she works during day and would rather do in MAU. Scheduled for 10/25 at 2000. -Given tylenol and flexeril.  Nurse instructed to monitor and if no relief to inform other providers for reassessment and additional medications as necessary.  -Patient instructed to take tylenol at home as needed. Also encouraged to increase fluids and make sure having regular bowel  movements. -Precautions reviewed. -Encouraged to call primary office or return to MAU if symptoms worsen or with the onset of new symptoms. -Discharged to home in stable condition.  Maryann Conners MSN, CNM Advanced Practice Provider, Center for [facility]

## 2022-05-09 NOTE — MAU Note (Signed)
Pt says has cramping - started Sunday night - took reg Tyl  2 tabs  at 2300- some relief  Has continued - worse now- 8. No Tyl today  Back pain started same . Last sex- Thurs

## 2022-05-11 ENCOUNTER — Inpatient Hospital Stay (HOSPITAL_COMMUNITY): Admit: 2022-05-11 | Payer: Medicaid Other

## 2022-05-11 ENCOUNTER — Inpatient Hospital Stay (HOSPITAL_COMMUNITY)
Admission: AD | Admit: 2022-05-11 | Discharge: 2022-05-11 | Disposition: A | Discharge status: Home / Self Care | Payer: Medicaid Other | Attending: Obstetrics and Gynecology | Admitting: Obstetrics and Gynecology

## 2022-05-11 DIAGNOSIS — R109 Unspecified abdominal pain: Secondary | ICD-10-CM | POA: Diagnosis present

## 2022-05-11 DIAGNOSIS — O26891 Other specified pregnancy related conditions, first trimester: Secondary | ICD-10-CM | POA: Insufficient documentation

## 2022-05-11 DIAGNOSIS — Z3A01 Less than 8 weeks gestation of pregnancy: Secondary | ICD-10-CM | POA: Diagnosis not present

## 2022-05-11 NOTE — MAU Note (Signed)
.  Brittany [name] is a 41 y.o. at [redacted]w[redacted]d here in MAU reporting: return for recheck of HCG labwork. Pt states her previous c/o back and lower ABD pain is the same, no change. Pt has no other current concerns. Pt denies VB and LOF.    Pain score: 7/10 Vitals:   05/11/22 1933  BP: 130/81  Pulse: 80  Resp: 18  Temp: 98.6 F (37 C)  SpO2: 100%      Lab orders placed from triage:

## 2022-05-11 NOTE — Progress Notes (Signed)
Pt presented for repeat qhcg for PUL. After review of chart it was determined qhcg not indicated today d/t good rise in quants x3. Recommend rpt [place] for viability in office around 6 weeks. Message sent to [facility].

## 2022-05-14 ENCOUNTER — Inpatient Hospital Stay (HOSPITAL_COMMUNITY)
Admission: AD | Admit: 2022-05-14 | Discharge: 2022-05-14 | Disposition: A | Discharge status: Home / Self Care | Payer: Medicaid Other | Attending: Obstetrics and Gynecology | Admitting: Obstetrics and Gynecology

## 2022-05-14 ENCOUNTER — Inpatient Hospital Stay (HOSPITAL_COMMUNITY): Payer: Medicaid Other

## 2022-05-14 DIAGNOSIS — O10011 Pre-existing essential hypertension complicating pregnancy, first trimester: Secondary | ICD-10-CM | POA: Insufficient documentation

## 2022-05-14 DIAGNOSIS — Z3A01 Less than 8 weeks gestation of pregnancy: Secondary | ICD-10-CM | POA: Diagnosis not present

## 2022-05-14 DIAGNOSIS — Z79899 Other long term (current) drug therapy: Secondary | ICD-10-CM | POA: Diagnosis not present

## 2022-05-14 DIAGNOSIS — O10919 Unspecified pre-existing hypertension complicating pregnancy, unspecified trimester: Secondary | ICD-10-CM | POA: Diagnosis not present

## 2022-05-14 DIAGNOSIS — R109 Unspecified abdominal pain: Secondary | ICD-10-CM | POA: Diagnosis not present

## 2022-05-14 DIAGNOSIS — Z3491 Encounter for supervision of normal pregnancy, unspecified, first trimester: Secondary | ICD-10-CM | POA: Diagnosis not present

## 2022-05-14 DIAGNOSIS — O23593 Infection of other part of genital tract in pregnancy, third trimester: Secondary | ICD-10-CM | POA: Diagnosis not present

## 2022-05-14 DIAGNOSIS — B3731 Acute candidiasis of vulva and vagina: Secondary | ICD-10-CM | POA: Diagnosis not present

## 2022-05-14 DIAGNOSIS — O98811 Other maternal infectious and parasitic diseases complicating pregnancy, first trimester: Secondary | ICD-10-CM | POA: Insufficient documentation

## 2022-05-14 DIAGNOSIS — O10911 Unspecified pre-existing hypertension complicating pregnancy, first trimester: Secondary | ICD-10-CM | POA: Diagnosis not present

## 2022-05-14 DIAGNOSIS — O26891 Other specified pregnancy related conditions, first trimester: Secondary | ICD-10-CM | POA: Diagnosis not present

## 2022-05-14 LAB — CBC
HCT: 33 % — ABNORMAL LOW (ref 36.0–46.0)
Hemoglobin: 10.9 g/dL — ABNORMAL LOW (ref 12.0–15.0)
MCH: 28.1 pg (ref 26.0–34.0)
MCHC: 33 g/dL (ref 30.0–36.0)
MCV: 85.1 fL (ref 80.0–100.0)
Platelets: 266 10*3/uL (ref 150–400)
RBC: 3.88 MIL/uL (ref 3.87–5.11)
RDW: 13.3 % (ref 11.5–15.5)
WBC: 9.2 10*3/uL (ref 4.0–10.5)
nRBC: 0 % (ref 0.0–0.2)

## 2022-05-14 LAB — HCG, QUANTITATIVE, PREGNANCY: hCG, Beta Chain, Quant, S: 7854 m[IU]/mL — ABNORMAL HIGH (ref <5)

## 2022-05-14 MED ORDER — NIFEDIPINE ER OSMOTIC RELEASE 30 MG PO TB24: 30.0000 mg | ORAL_TABLET | Freq: Every day | ORAL | 5 refills | Status: DC

## 2022-05-14 MED ORDER — TERCONAZOLE 0.4 % VA CREA: 1.0000 | TOPICAL_CREAM | Freq: Every day | VAGINAL | 0 refills | Status: DC

## 2022-05-14 NOTE — MAU Provider Note (Signed)
Chief Complaint:  Abdominal Pain   None     HPI: Brittany [name] is a 41 y.o. [id number] at [redacted]w[redacted]d by LMP who presents to maternity admissions reporting abdominal pain and cramping.  She was initially seen on 05/03/22 for abdominal pain in early pregnancy and had hcg of 39.  On 05/06/22, hcg rose to 202, and rose again to 1,104 on 05/09/22.  She reports her pain has been ongoing since 10/17 but has worsened.  She describes the pain as pressure in her low abdomen, some sharp pains radiating up into her mid abdomen, and pain down in her upper thighs. There is also some vaginal itching and low appetite but no vomiting.   She is currently taking Norvasc daily for HTN.    HPI  Past Medical History: Past Medical History:  Diagnosis Date   Gallstone    Headache(784.0)    Heart murmur    Hypertension    Infection    UTI   Maternal chronic hypertension in first trimester 04/09/2011   Pregnancy induced hypertension    after 2nd delivery   Vaginal Pap smear, abnormal    cant remember    Past obstetric history: OB History  Gravida Para Term Preterm AB Living  10 4 2 2 5 4   SAB IAB Ectopic Multiple Live Births  4 1 0 0 4    # Outcome Date GA Lbr Len/2nd Weight Sex Delivery Anes PTL Lv  10 Current           9 Preterm 06/13/18 [redacted]w[redacted]d 07:04 / 00:15 2300 g F Vag-Spont EPI  LIV  8 Term 05/03/16 [redacted]w[redacted]d 00:08 / 00:01 2175 g M Vag-Spont None  LIV  7 Preterm 04/06/11 [redacted]w[redacted]d 08:25 / 00:08 2455 g F Vag-Spont None  LIV     Complications: PIH (pregnancy induced hypertension)  6 Term 10/21/05 [redacted]w[redacted]d    Vag-Spont        Complications: Failure to Progress in Second Stage  5 IAB           4 SAB           3 SAB           2 SAB           1 SAB             Past Surgical History: Past Surgical History:  Procedure Laterality Date   DILATION AND CURETTAGE OF UTERUS     dont remember year   LEEP      Family History: Family History  Problem Relation Age of Onset   Asthma Mother    Hypertension Mother     CAD Mother    Asthma Maternal Grandmother    Hypertension Maternal Grandmother     Social History: Social History   Tobacco Use   Smoking status: Never   Smokeless tobacco: Never  Vaping Use   Vaping Use: Never used  Substance Use Topics   Alcohol use: No    Alcohol/week: 0.0 standard drinks of alcohol   Drug use: No    Allergies: No Known Allergies  Meds:  Medications Prior to Admission  Medication Sig Dispense Refill Last Dose   acetaminophen (TYLENOL) 500 MG tablet Take 500 mg by mouth every 6 (six) hours as needed. (Patient not taking: Reported on 02/10/2022)      amLODipine (NORVASC) 10 MG tablet TAKE 1 TABLET(10 MG) BY MOUTH DAILY 90 tablet 2    fluticasone (FLONASE) 50 MCG/ACT nasal spray Place 1 spray  into both nostrils daily for 3 days. 16 g 0    prenatal vitamin w/FE, FA (PRENATAL 1 + 1) 27-1 MG TABS tablet Take 1 tablet by mouth daily at 12 noon. 30 tablet 11    promethazine (PHENERGAN) 25 MG tablet Take 0.5-1 tablets (12.5-25 mg total) by mouth every 6 (six) hours as needed for nausea or vomiting. 30 tablet 0     ROS:  Review of Systems  Constitutional:  Positive for appetite change. Negative for chills, fatigue and fever.  Respiratory:  Negative for shortness of breath.   Cardiovascular:  Negative for chest pain.  Gastrointestinal:  Positive for abdominal pain. Negative for nausea and vomiting.  Genitourinary:  Positive for pelvic pain. Negative for difficulty urinating, dysuria, flank pain, vaginal bleeding, vaginal discharge and vaginal pain.  Neurological:  Negative for dizziness and headaches.  Psychiatric/Behavioral: Negative.       I have reviewed patient's Past Medical Hx, Surgical Hx, Family Hx, Social Hx, medications and allergies.   Physical Exam  Patient Vitals for the past 24 hrs:  BP Temp Temp src Pulse Resp SpO2 Height Weight  05/14/22 1940 (!) 140/80 98.7 F (37.1 C) Oral 85 18 100 % 5\' 5"  (1.651 m) 81 kg   Constitutional:  Well-developed, well-nourished female in no acute distress.  Cardiovascular: normal rate Respiratory: normal effort GI: Abd soft, non-tender, gravid appropriate for gestational age.  MS: Extremities nontender, no edema, normal ROM Neurologic: Alert and oriented x 4.  GU: Neg CVAT.  PELVIC EXAM: Deferred      Labs: Results for orders placed or performed during the hospital encounter of 05/14/22 (from the past 24 hour(s))  CBC     Status: Abnormal   Collection Time: 05/14/22  8:05 PM  Result Value Ref Range   WBC 9.2 4.0 - 10.5 K/uL   RBC 3.88 3.87 - 5.11 MIL/uL   Hemoglobin 10.9 (L) 12.0 - 15.0 g/dL   HCT 05/16/22 (L) 75.6 - 43.3 %   MCV 85.1 80.0 - 100.0 fL   MCH 28.1 26.0 - 34.0 pg   MCHC 33.0 30.0 - 36.0 g/dL   RDW 29.5 18.8 - 41.6 %   Platelets 266 150 - 400 K/uL   nRBC 0.0 0.0 - 0.2 %  hCG, quantitative, pregnancy     Status: Abnormal   Collection Time: 05/14/22  8:05 PM  Result Value Ref Range   hCG, Beta Chain, Quant, S 7,854 (H) <5 mIU/mL      Imaging:  05/16/22 OB Transvaginal  Result Date: 05/14/2022 CLINICAL DATA:  Abdominal pain, positive pregnancy test. LMP 04/07/2022. EXAM: OBSTETRIC <14 WK ULTRASOUND TECHNIQUE: Transabdominal ultrasound was performed for evaluation of the gestation as well as the maternal uterus and adnexal regions. COMPARISON:  05/09/2022 FINDINGS: Intrauterine gestational sac: Single Yolk sac:  Not Visualized. Embryo:  Not Visualized. Cardiac Activity: Not applicable MSD:  9 mm   5 w   5 d CRL: Not applicable Subchorionic hemorrhage:  None visualized. Maternal uterus/adnexae: The uterus is anteverted. The cervix is unremarkable. No intrauterine masses are seen. Trace free fluid within the cul-de-sac. The maternal ovaries are unremarkable. IMPRESSION: Development of an intrauterine gestational sac. Nonvisualization of the yolk sac roughly estimates the gestational between 5.0 and 5.5 weeks. Follow-up sonography in 10-14 days would be helpful in documenting  appropriate progression and better aging the gestation. Electronically Signed   By: 04-12-1977 M.D.   On: 05/14/2022 21:08   05/16/2022 OB Transvaginal  Result Date: 05/09/2022 CLINICAL DATA:  Cramps. EXAM: TRANSVAGINAL OB ULTRASOUND TECHNIQUE: Transvaginal ultrasound was performed for complete evaluation of the gestation as well as the maternal uterus, adnexal regions, and pelvic cul-de-sac. COMPARISON:  May 03, 2022 FINDINGS: Intrauterine gestational sac: None Yolk sac:  Not Visualized. Embryo:  Not Visualized. Cardiac Activity: Not Visualized. Heart Rate: N/A bpm Maternal uterus/adnexae: The endometrium measures 2.7 cm in thickness within the region near the uterine fundus. This is increased in size when compared to the prior study (measured 1.0 cm on the prior exam). The right ovary measures 3.5 cm x 2.0 cm x 2.6 cm and is normal in appearance. The left ovary is not visualized. No pelvic free fluid is seen. IMPRESSION: 1. Focal endometrial thickening, as described above, without evidence of an intrauterine pregnancy. Electronically Signed   By: Virgina Norfolk M.D.   On: 05/09/2022 21:32   US OB LESS THAN 14 WEEKS WITH OB TRANSVAGINAL  Result Date: 05/03/2022 CLINICAL DATA:  Abdominal pain. EXAM: OBSTETRIC <14 WK [place] AND TRANSVAGINAL OB US TECHNIQUE: Both transabdominal and transvaginal ultrasound examinations were performed for complete evaluation of the gestation as well as the maternal uterus, adnexal regions, and pelvic cul-de-sac. Transvaginal technique was performed to assess early pregnancy. COMPARISON:  None Available. FINDINGS: Intrauterine gestational sac: None Endometrium: Appears normal measuring 1 cm in thickness. Maternal uterus/adnexae: The bilateral ovaries are visualized and appear within normal limits. Corpus luteum is noted in the right ovary. There is a small amount of free fluid in the pelvis. IMPRESSION: 1. No intrauterine gestational sac identified. No adnexal mass or  significant free fluid. In the setting of a positive pregnancy test findings may related to early normal IUP, completed abortion or occult ectopic pregnancy. Recommend clinical correlation and follow-up. Electronically Signed   By: Ronney Asters M.D.   On: 05/03/2022 21:32    MAU Course/MDM: Orders Placed This Encounter  Procedures   US OB Transvaginal   CBC   hCG, quantitative, pregnancy   Discharge patient    Meds ordered this encounter  Medications   NIFEdipine (PROCARDIA XL) 30 MG 24 hr tablet    Sig: Take 1 tablet (30 mg total) by mouth daily.    Dispense:  30 tablet    Refill:  5    Order Specific Question:   Supervising Provider    Answer:   Aletha Halim [[id number]]   terconazole (TERAZOL 7) 0.4 % vaginal cream    Sig: Place 1 applicator vaginally at bedtime.    Dispense:  45 g    Refill:  0    Order Specific Question:   Supervising Provider    Answer:   Aletha Halim [[id number]]     IUP confirmed on today's [place] with gestational sac not previously seen.  MSD date agrees with LMP EDD of 01/11/22, or [redacted]w[redacted]d today.  Pt has [facility] intake at [facility] on 05/19/22 and New OB appt on 12/18.  Pt to follow up with prenatal care as scheduled.  Pt medication for HTN switched from Norvasc to Procardia XL today to continue once daily dosing for pt.  Vaginal candida seen on prior vaginal swab in MAU, and pt today reports vaginal symptoms so will treat with Terazol 7.  Precautions, reasons to return to MAU reviewed.     Assessment: 1. Abdominal pain during pregnancy in first trimester   2. Normal IUP (intrauterine pregnancy) on prenatal ultrasound, first trimester   3. [redacted] weeks gestation of pregnancy   4. Chronic hypertension affecting pregnancy   5. Vaginal  candidiasis     Plan: Discharge home   Follow-up Information     [facility] Follow up.   Specialty: Obstetrics and Gynecology Why: As scheduled Contact information: [address] [place] [address] [phone]        [facility] Follow up.   Specialty: Obstetrics and Gynecology Why: As needed for emergencies Contact information: [address] [id number] Wilhemina Bonito [place] [address] [phone]               Allergies as of 05/14/2022   No Known Allergies      Medication List     STOP taking these medications    amLODipine 10 MG tablet Commonly known as: NORVASC       TAKE these medications    acetaminophen 500 MG tablet Commonly known as: TYLENOL Take 500 mg by mouth every 6 (six) hours as needed.   fluticasone 50 MCG/ACT nasal spray Commonly known as: FLONASE Place 1 spray into both nostrils daily for 3 days.   NIFEdipine 30 MG 24 hr tablet Commonly known as: Procardia XL Take 1 tablet (30 mg total) by mouth daily.   prenatal vitamin w/FE, FA 27-1 MG Tabs tablet Take 1 tablet by mouth daily at 12 noon.   promethazine 25 MG tablet Commonly known as: PHENERGAN Take 0.5-1 tablets (12.5-25 mg total) by mouth every 6 (six) hours as needed for nausea or vomiting.   terconazole 0.4 % vaginal cream Commonly known as: TERAZOL 7 Place 1 applicator vaginally at bedtime.        Sharen Counter Certified Nurse-Midwife 05/14/2022 9:48 PM

## 2022-05-14 NOTE — MAU Note (Signed)
.  Brittany [name] is a 41 y.o. at [redacted]w[redacted]d here in MAU reporting: intermittent ABD cramping with sharp pain and pelvis and buttock pressure that increased since 0300, pt has had ongoing ABD cramping but this is worse. Pt denies VB and LOF.  Last intercourse 2 weeks ago   Onset of complaint: 0300 Pain score: 8/10 Vitals:   05/14/22 1940  BP: (!) 140/80  Pulse: 85  Resp: 18  Temp: 98.7 F (37.1 C)  SpO2: 100%      Lab orders placed from triage:

## 2022-05-19 ENCOUNTER — Other Ambulatory Visit: Payer: Self-pay | Admitting: *Deleted

## 2022-05-19 DIAGNOSIS — Z3A01 Less than 8 weeks gestation of pregnancy: Secondary | ICD-10-CM

## 2022-05-22 ENCOUNTER — Encounter: Payer: Self-pay | Admitting: Family Medicine

## 2022-05-22 ENCOUNTER — Encounter (HOSPITAL_COMMUNITY): Payer: Self-pay | Admitting: Obstetrics and Gynecology

## 2022-05-22 ENCOUNTER — Inpatient Hospital Stay (HOSPITAL_COMMUNITY): Payer: Medicaid Other

## 2022-05-22 ENCOUNTER — Inpatient Hospital Stay (HOSPITAL_COMMUNITY)
Admission: AD | Admit: 2022-05-22 | Discharge: 2022-05-22 | Disposition: A | Discharge status: Home / Self Care | Payer: Medicaid Other | Attending: Obstetrics and Gynecology | Admitting: Obstetrics and Gynecology

## 2022-05-22 ENCOUNTER — Other Ambulatory Visit: Payer: Self-pay

## 2022-05-22 DIAGNOSIS — O210 Mild hyperemesis gravidarum: Secondary | ICD-10-CM | POA: Diagnosis present

## 2022-05-22 DIAGNOSIS — Z3A01 Less than 8 weeks gestation of pregnancy: Secondary | ICD-10-CM | POA: Insufficient documentation

## 2022-05-22 DIAGNOSIS — Z3491 Encounter for supervision of normal pregnancy, unspecified, first trimester: Secondary | ICD-10-CM

## 2022-05-22 DIAGNOSIS — Z79899 Other long term (current) drug therapy: Secondary | ICD-10-CM | POA: Diagnosis not present

## 2022-05-22 DIAGNOSIS — O98511 Other viral diseases complicating pregnancy, first trimester: Secondary | ICD-10-CM | POA: Diagnosis not present

## 2022-05-22 DIAGNOSIS — J069 Acute upper respiratory infection, unspecified: Secondary | ICD-10-CM | POA: Diagnosis not present

## 2022-05-22 DIAGNOSIS — Z1152 Encounter for screening for COVID-19: Secondary | ICD-10-CM | POA: Diagnosis not present

## 2022-05-22 LAB — URINALYSIS, ROUTINE W REFLEX MICROSCOPIC
Bilirubin Urine: NEGATIVE
Glucose, UA: NEGATIVE mg/dL
Hgb urine dipstick: NEGATIVE
Ketones, ur: NEGATIVE mg/dL
Leukocytes,Ua: NEGATIVE
Nitrite: NEGATIVE
Protein, ur: NEGATIVE mg/dL
Specific Gravity, Urine: 1.015 (ref 1.005–1.030)
pH: 7 (ref 5.0–8.0)

## 2022-05-22 LAB — RESP PANEL BY RT-PCR (FLU A&B, COVID) ARPGX2
Influenza A by PCR: NEGATIVE
Influenza B by PCR: NEGATIVE
SARS Coronavirus 2 by RT PCR: NEGATIVE

## 2022-05-22 LAB — HCG, QUANTITATIVE, PREGNANCY: hCG, Beta Chain, Quant, S: 51709 m[IU]/mL — ABNORMAL HIGH (ref <5)

## 2022-05-22 MED ORDER — ONDANSETRON 4 MG PO TBDP
4.0000 mg | ORAL_TABLET | Freq: Once | ORAL | Status: AC
  Administered 2022-05-22: 4 mg via ORAL
  Filled 2022-05-22: qty 1

## 2022-05-22 MED ORDER — METOCLOPRAMIDE HCL 10 MG PO TABS: 10.0000 mg | ORAL_TABLET | Freq: Four times a day (QID) | ORAL | 0 refills | Status: DC | PRN

## 2022-05-22 MED ORDER — METOCLOPRAMIDE HCL 10 MG PO TABS
10.0000 mg | ORAL_TABLET | Freq: Once | ORAL | Status: AC
  Administered 2022-05-22: 10 mg via ORAL
  Filled 2022-05-22: qty 1

## 2022-05-22 NOTE — Progress Notes (Signed)
Pt reports nausea is not better after zofran.

## 2022-05-22 NOTE — MAU Provider Note (Cosign Needed Addendum)
History     CSN: [id number]  Arrival date and time: 05/22/22 1458  Chief Complaint  Patient presents with   Abdominal Pain   Nausea   SOB   Brittany [name] is a 41 year old G10 [id number] at 6 weeks 3 days presenting for nausea, vomiting, diarrhea, cough, congestion, runny nose, abdominal pain, abdominal cramping.  The symptoms all started within the last 3 to 4 days.  Reports that she is also having burning with urination and increased urinary frequency.  Denies any known sick contacts.  Denies any vaginal bleeding, gush of fluid, contractions.  Was seen here on 28 October.  Ultrasound showing gestational sac with no fetal pole or yolk sac.  Beta-hCG at that time was appropriate.    OB History     Gravida  10   Para  4   Term  2   Preterm  2   AB  5   Living  4      SAB  4   IAB  1   Ectopic  0   Multiple  0   Live Births  4           Past Medical History:  Diagnosis Date   Gallstone    Headache(784.0)    Heart murmur    Hypertension    Infection    UTI   Maternal chronic hypertension in first trimester 04/09/2011   Pregnancy induced hypertension    after 2nd delivery   Vaginal Pap smear, abnormal    cant remember    Past Surgical History:  Procedure Laterality Date   DILATION AND CURETTAGE OF UTERUS     dont remember year   LEEP      Family History  Problem Relation Age of Onset   Asthma Mother    Hypertension Mother    CAD Mother    Asthma Maternal Grandmother    Hypertension Maternal Grandmother     Social History   Tobacco Use   Smoking status: Never   Smokeless tobacco: Never  Vaping Use   Vaping Use: Never used  Substance Use Topics   Alcohol use: No    Alcohol/week: 0.0 standard drinks of alcohol   Drug use: No    Allergies: No Known Allergies  Medications Prior to Admission  Medication Sig Dispense Refill Last Dose   NIFEdipine (PROCARDIA XL) 30 MG 24 hr tablet Take 1 tablet (30 mg total) by mouth daily. 30 tablet 5  05/22/2022   prenatal vitamin w/FE, FA (PRENATAL 1 + 1) 27-1 MG TABS tablet Take 1 tablet by mouth daily at 12 noon. 30 tablet 11 05/22/2022   promethazine (PHENERGAN) 25 MG tablet Take 0.5-1 tablets (12.5-25 mg total) by mouth every 6 (six) hours as needed for nausea or vomiting. 30 tablet 0 05/22/2022   acetaminophen (TYLENOL) 500 MG tablet Take 500 mg by mouth every 6 (six) hours as needed. (Patient not taking: Reported on 02/10/2022)      fluticasone (FLONASE) 50 MCG/ACT nasal spray Place 1 spray into both nostrils daily for 3 days. 16 g 0    terconazole (TERAZOL 7) 0.4 % vaginal cream Place 1 applicator vaginally at bedtime. 45 g 0     Review of Systems  Constitutional:  Positive for chills and fever.  HENT:  Positive for congestion, rhinorrhea, sneezing and sore throat.   Eyes:  Negative for visual disturbance.  Respiratory:  Positive for cough. Negative for shortness of breath.   Cardiovascular:  Negative for chest pain.  Gastrointestinal:  Positive for abdominal pain, constipation, diarrhea and nausea.  Endocrine: Positive for polyuria.  Genitourinary:  Negative for vaginal bleeding and vaginal discharge.  Musculoskeletal:  Positive for myalgias.  Neurological:  Positive for light-headedness and headaches.   Physical Exam   Blood pressure 125/76, pulse 84, temperature 98.6 F (37 C), temperature source Oral, resp. rate 20, height 5\' 5"  (1.651 m), weight 79.7 kg, last menstrual period 04/07/2022, SpO2 100 %, currently breastfeeding.  Physical Exam Vitals reviewed.  Constitutional:      Appearance: She is normal weight. She is ill-appearing.  HENT:     Head: Normocephalic and atraumatic.     Nose: Congestion and rhinorrhea present.     Mouth/Throat:     Mouth: Mucous membranes are moist.  Eyes:     Extraocular Movements: Extraocular movements intact.     Pupils: Pupils are equal, round, and reactive to light.  Cardiovascular:     Rate and Rhythm: Normal rate and regular  rhythm.     Pulses: Normal pulses.  Pulmonary:     Effort: Pulmonary effort is normal.  Abdominal:     Palpations: Abdomen is soft.     Tenderness: There is generalized abdominal tenderness.  Musculoskeletal:     Cervical back: Normal range of motion.  Skin:    General: Skin is warm.     Capillary Refill: Capillary refill takes less than 2 seconds.  Neurological:     General: No focal deficit present.     Mental Status: She is alert.  Psychiatric:        Mood and Affect: Mood normal.    Results for orders placed or performed during the hospital encounter of 05/22/22 (from the past 24 hour(s))  Urinalysis, Routine w reflex microscopic Urine, Clean Catch     Status: Abnormal   Collection Time: 05/22/22  4:00 PM  Result Value Ref Range   Color, Urine YELLOW YELLOW   APPearance HAZY (A) CLEAR   Specific Gravity, Urine 1.015 1.005 - 1.030   pH 7.0 5.0 - 8.0   Glucose, UA NEGATIVE NEGATIVE mg/dL   Hgb urine dipstick NEGATIVE NEGATIVE   Bilirubin Urine NEGATIVE NEGATIVE   Ketones, ur NEGATIVE NEGATIVE mg/dL   Protein, ur NEGATIVE NEGATIVE mg/dL   Nitrite NEGATIVE NEGATIVE   Leukocytes,Ua NEGATIVE NEGATIVE  Resp Panel by RT-PCR (Flu A&B, Covid) Anterior Nasal Swab     Status: None   Collection Time: 05/22/22  4:06 PM   Specimen: Anterior Nasal Swab  Result Value Ref Range   SARS Coronavirus 2 by RT PCR NEGATIVE NEGATIVE   Influenza A by PCR NEGATIVE NEGATIVE   Influenza B by PCR NEGATIVE NEGATIVE  hCG, quantitative, pregnancy     Status: Abnormal   Collection Time: 05/22/22  5:53 PM  Result Value Ref Range   hCG, Beta Chain, Quant, S 51,709 (H) <5 mIU/mL   US OB Transvaginal  Result Date: 05/22/2022 CLINICAL DATA:  Abdominal pain EXAM: OBSTETRIC <14 WK [place] AND TRANSVAGINAL OB US TECHNIQUE: Both transabdominal and transvaginal ultrasound examinations were performed for complete evaluation of the gestation as well as the maternal uterus, adnexal regions, and pelvic cul-de-sac.  Transvaginal technique was performed to assess early pregnancy. COMPARISON:  Ultrasound 05/14/2022 FINDINGS: Intrauterine gestational sac: Single Yolk sac:  Visualized. Embryo:  Visualized. Cardiac Activity: Visualized. Heart Rate: 113 bpm CRL:  4.7 mm   6 w   1 d  [place] EDC: 01/13/2022 Subchorionic hemorrhage:  None visualized. Maternal uterus/adnexae: Normal appearance of the bilateral ovaries. Trace free fluid in the cul-de-sac. IMPRESSION: Single intrauterine fetus with crown-rump length of 4.7 mm corresponding with 6 week 1 day gestational age. Fetal heart rate of 113 beats per minute. Electronically Signed   By: Placido Sou M.D.   On: 05/22/2022 21:42    MAU Course  Procedures  MDM Beta-hCG quant 51,709 Respiratory panel negative for flu and COVID Urinalysis limits Transvaginal ultrasound ordered and pending at time of shift change Bedside ultrasound with attending showing IUP, could notify cardiac activity  Patient handed off to oncoming provider. [place] and imaging reviewed. Viable IUP on [place] c/w dates. Nausea improved, no vomiting, tolerating po. Discussed resp virus supportive care, may retest for Covid in 1-2 days using home test. Stable for discharge home.  Assessment and Plan   1. [redacted] weeks gestation of pregnancy   2. Normal intrauterine pregnancy on prenatal ultrasound in first trimester   3. Morning sickness   4. Upper respiratory virus    Discharge home Follow up at [facility] as scheduled Return precautions Rx Reglan Safe med list  Allergies as of 05/22/2022   No Known Allergies      Medication List     STOP taking these medications    terconazole 0.4 % vaginal cream Commonly known as: TERAZOL 7       TAKE these medications    acetaminophen 500 MG tablet Commonly known as: TYLENOL Take 500 mg by mouth every 6 (six) hours as needed.   fluticasone 50 MCG/ACT nasal spray Commonly known as: FLONASE Place 1 spray into both  nostrils daily for 3 days.   metoCLOPramide 10 MG tablet Commonly known as: REGLAN Take 1 tablet (10 mg total) by mouth every 6 (six) hours as needed for nausea or vomiting.   NIFEdipine 30 MG 24 hr tablet Commonly known as: Procardia XL Take 1 tablet (30 mg total) by mouth daily.   prenatal vitamin w/FE, FA 27-1 MG Tabs tablet Take 1 tablet by mouth daily at 12 noon.   promethazine 25 MG tablet Commonly known as: PHENERGAN Take 0.5-1 tablets (12.5-25 mg total) by mouth every 6 (six) hours as needed for nausea or vomiting.       Julianne Handler, CNM  05/22/2022 9:51 PM

## 2022-05-22 NOTE — MAU Note (Signed)
Brittany [name] is a 41 y.o. at [redacted]w[redacted]d here in MAU reporting: she has abdominal pain, SOB and nausea.  States she has cold symptoms, runny nose, cough, and congestion.   Also reports frequent urination, reports burning and pressure with voiding. Denies VB. LMP: N/A Onset of complaint: 3-4 days ago Pain score: 8 Vitals:   05/22/22 1557  BP: 125/76  Pulse: 84  Resp: 20  Temp: 98.6 F (37 C)  SpO2: 100%     FHT:NA Lab orders placed from triage:   UA

## 2022-05-22 NOTE — Discharge Instructions (Signed)

## 2022-05-26 ENCOUNTER — Other Ambulatory Visit: Payer: Self-pay | Admitting: *Deleted

## 2022-05-26 MED ORDER — SCOPOLAMINE 1 MG/3DAYS TD PT72: 1.0000 | MEDICATED_PATCH | TRANSDERMAL | 12 refills | Status: DC

## 2022-06-01 ENCOUNTER — Telehealth: Payer: Self-pay

## 2022-06-01 ENCOUNTER — Encounter (HOSPITAL_COMMUNITY): Payer: Self-pay | Admitting: Obstetrics and Gynecology

## 2022-06-01 ENCOUNTER — Other Ambulatory Visit: Payer: Self-pay

## 2022-06-01 ENCOUNTER — Inpatient Hospital Stay (HOSPITAL_COMMUNITY)
Admission: AD | Admit: 2022-06-01 | Discharge: 2022-06-01 | Disposition: A | Discharge status: Home / Self Care | Payer: Medicaid Other | Attending: Obstetrics and Gynecology | Admitting: Obstetrics and Gynecology

## 2022-06-01 DIAGNOSIS — O09521 Supervision of elderly multigravida, first trimester: Secondary | ICD-10-CM | POA: Diagnosis not present

## 2022-06-01 DIAGNOSIS — Z3A01 Less than 8 weeks gestation of pregnancy: Secondary | ICD-10-CM | POA: Diagnosis not present

## 2022-06-01 DIAGNOSIS — O26891 Other specified pregnancy related conditions, first trimester: Secondary | ICD-10-CM | POA: Insufficient documentation

## 2022-06-01 DIAGNOSIS — R42 Dizziness and giddiness: Secondary | ICD-10-CM | POA: Diagnosis not present

## 2022-06-01 LAB — BASIC METABOLIC PANEL
Anion gap: 9 (ref 5–15)
BUN: 7 mg/dL (ref 6–20)
CO2: 22 mmol/L (ref 22–32)
Calcium: 9 mg/dL (ref 8.9–10.3)
Chloride: 104 mmol/L (ref 98–111)
Creatinine, Ser: 0.59 mg/dL (ref 0.44–1.00)
GFR, Estimated: >60 mL/min (ref >60)
Glucose, Bld: 107 mg/dL — ABNORMAL HIGH (ref 70–99)
Potassium: 3.8 mmol/L (ref 3.5–5.1)
Sodium: 135 mmol/L (ref 135–145)

## 2022-06-01 LAB — URINALYSIS, ROUTINE W REFLEX MICROSCOPIC
Bilirubin Urine: NEGATIVE
Glucose, UA: NEGATIVE mg/dL
Hgb urine dipstick: NEGATIVE
Ketones, ur: NEGATIVE mg/dL
Leukocytes,Ua: NEGATIVE
Nitrite: NEGATIVE
Protein, ur: NEGATIVE mg/dL
Specific Gravity, Urine: 1.014 (ref 1.005–1.030)
pH: 7 (ref 5.0–8.0)

## 2022-06-01 LAB — CBC
HCT: 34.4 % — ABNORMAL LOW (ref 36.0–46.0)
Hemoglobin: 11.4 g/dL — ABNORMAL LOW (ref 12.0–15.0)
MCH: 28.4 pg (ref 26.0–34.0)
MCHC: 33.1 g/dL (ref 30.0–36.0)
MCV: 85.6 fL (ref 80.0–100.0)
Platelets: 253 10*3/uL (ref 150–400)
RBC: 4.02 MIL/uL (ref 3.87–5.11)
RDW: 13.5 % (ref 11.5–15.5)
WBC: 9.3 10*3/uL (ref 4.0–10.5)
nRBC: 0 % (ref 0.0–0.2)

## 2022-06-01 LAB — T4, FREE: Free T4: 1.03 ng/dL (ref 0.61–1.12)

## 2022-06-01 LAB — TSH: TSH: 0.203 u[IU]/mL — ABNORMAL LOW (ref 0.350–4.500)

## 2022-06-01 MED ORDER — NIFEDIPINE ER OSMOTIC RELEASE 30 MG PO TB24
30.0000 mg | ORAL_TABLET | Freq: Every day | ORAL | Status: DC
  Administered 2022-06-01: 30 mg via ORAL
  Filled 2022-06-01: qty 1

## 2022-06-01 NOTE — MAU Provider Note (Signed)
History     CSN: [id number]  Arrival date and time: 06/01/22 1448   Event Date/Time   First Provider Initiated Contact with Patient 06/01/22 [phone]      Chief Complaint  Patient presents with   Dizziness   Nausea   HPI Anelle Parlow is a 41 y.o. [id number] at [redacted]w[redacted]d who presents to MAU via EMS with chief complaint of dizziness. This is a new problem, onset last night and continuing through this morning. She denies chest pain, SOB, weakness, syncope.   Patient also c/o recurrent nausea without vomiting.  She ate a small pizza around 0600 or 0630 this morning. She denies dysuria, vaginal bleeding, abdominal pain, fever or recent illness.  Patient's pregnancy is c/b CHTN on Procardia XL 30 mg. She has not yet taken today's dose.  Patient [facility].  OB History     Gravida  10   Para  4   Term  2   Preterm  2   AB  5   Living  4      SAB  4   IAB  1   Ectopic  0   Multiple  0   Live Births  4           Past Medical History:  Diagnosis Date   Gallstone    Headache(784.0)    Heart murmur    Hypertension    Infection    UTI   Maternal chronic hypertension in first trimester 04/09/2011   Pregnancy induced hypertension    after 2nd delivery   Vaginal Pap smear, abnormal    cant remember    Past Surgical History:  Procedure Laterality Date   DILATION AND CURETTAGE OF UTERUS     dont remember year   LEEP      Family History  Problem Relation Age of Onset   Asthma Mother    Hypertension Mother    CAD Mother    Asthma Maternal Grandmother    Hypertension Maternal Grandmother     Social History   Tobacco Use   Smoking status: Never   Smokeless tobacco: Never  Vaping Use   Vaping Use: Never used  Substance Use Topics   Alcohol use: No    Alcohol/week: 0.0 standard drinks of alcohol   Drug use: No    Allergies: No Known Allergies  Medications Prior to Admission  Medication Sig Dispense Refill Last Dose    scopolamine (TRANSDERM-SCOP) 1 MG/3DAYS Place 1 patch (1.5 mg total) onto the skin every 3 (three) days. 10 patch 12    acetaminophen (TYLENOL) 500 MG tablet Take 500 mg by mouth every 6 (six) hours as needed. (Patient not taking: Reported on 02/10/2022)      fluticasone (FLONASE) 50 MCG/ACT nasal spray Place 1 spray into both nostrils daily for 3 days. 16 g 0    metoCLOPramide (REGLAN) 10 MG tablet Take 1 tablet (10 mg total) by mouth every 6 (six) hours as needed for nausea or vomiting. 30 tablet 0    NIFEdipine (PROCARDIA XL) 30 MG 24 hr tablet Take 1 tablet (30 mg total) by mouth daily. 30 tablet 5    prenatal vitamin w/FE, FA (PRENATAL 1 + 1) 27-1 MG TABS tablet Take 1 tablet by mouth daily at 12 noon. 30 tablet 11    promethazine (PHENERGAN) 25 MG tablet Take 0.5-1 tablets (12.5-25 mg total) by mouth every 6 (six) hours as needed for nausea or vomiting. 30 tablet 0  Review of Systems  Constitutional:  Positive for fatigue.  Gastrointestinal:  Positive for nausea.  Neurological:  Positive for dizziness.  All other systems reviewed and are negative.  Physical Exam   Height 5\' 5"  (1.651 m), weight 79.9 kg, last menstrual period 04/07/2022, currently breastfeeding.  Physical Exam Vitals and nursing note reviewed. Exam conducted with a chaperone present.  Constitutional:      Appearance: Normal appearance. She is not ill-appearing.     Comments: Patient is tearful on arrival to MAU  Cardiovascular:     Rate and Rhythm: Normal rate.     Pulses: Normal pulses.     Heart sounds: Normal heart sounds.  Pulmonary:     Effort: Pulmonary effort is normal.     Breath sounds: Normal breath sounds.  Abdominal:     General: Abdomen is flat.  Skin:    Capillary Refill: Capillary refill takes less than 2 seconds.  Neurological:     Mental Status: She is alert and oriented to person, place, and time.  Psychiatric:        Mood and Affect: Mood normal.        Behavior: Behavior normal.         Thought Content: Thought content normal.        Judgment: Judgment normal.     MAU Course  Procedures  MDM  --IUP confirmed 05/22/2022 --TSH 0.203. Patient with Subclinical Hyperthyroidism on her hospital problem list. Discussed with Dr. Ilda Basset, who advises add-on Free T4 and outpatient surveillance. Order placed accordingly  Orders Placed This Encounter  Procedures   CBC   Basic metabolic panel   Urinalysis, Routine w reflex microscopic Urine, Clean Catch   TSH   Results for orders placed or performed during the hospital encounter of 06/01/22 (from the past 24 hour(s))  CBC     Status: Abnormal   Collection Time: 06/01/22  9:42 AM  Result Value Ref Range   WBC 9.3 4.0 - 10.5 K/uL   RBC 4.02 3.87 - 5.11 MIL/uL   Hemoglobin 11.4 (L) 12.0 - 15.0 g/dL   HCT 34.4 (L) 36.0 - 46.0 %   MCV 85.6 80.0 - 100.0 fL   MCH 28.4 26.0 - 34.0 pg   MCHC 33.1 30.0 - 36.0 g/dL   RDW 13.5 11.5 - 15.5 %   Platelets 253 150 - 400 K/uL   nRBC 0.0 0.0 - 0.2 %  Basic metabolic panel     Status: Abnormal   Collection Time: 06/01/22  9:42 AM  Result Value Ref Range   Sodium 135 135 - 145 mmol/L   Potassium 3.8 3.5 - 5.1 mmol/L   Chloride 104 98 - 111 mmol/L   CO2 22 22 - 32 mmol/L   Glucose, Bld 107 (H) 70 - 99 mg/dL   BUN 7 6 - 20 mg/dL   Creatinine, Ser 0.59 0.44 - 1.00 mg/dL   Calcium 9.0 8.9 - 10.3 mg/dL   GFR, Estimated >60 >60 mL/min   Anion gap 9 5 - 15  TSH     Status: Abnormal   Collection Time: 06/01/22  9:42 AM  Result Value Ref Range   TSH 0.203 (L) 0.350 - 4.500 uIU/mL  T4, free     Status: None   Collection Time: 06/01/22  9:42 AM  Result Value Ref Range   Free T4 1.03 0.61 - 1.12 ng/dL  Urinalysis, Routine w reflex microscopic Urine, Clean Catch     Status: None   Collection Time: 06/01/22  9:52 AM  Result Value Ref Range   Color, Urine YELLOW YELLOW   APPearance CLEAR CLEAR   Specific Gravity, Urine 1.014 1.005 - 1.030   pH 7.0 5.0 - 8.0   Glucose, UA NEGATIVE  NEGATIVE mg/dL   Hgb urine dipstick NEGATIVE NEGATIVE   Bilirubin Urine NEGATIVE NEGATIVE   Ketones, ur NEGATIVE NEGATIVE mg/dL   Protein, ur NEGATIVE NEGATIVE mg/dL   Nitrite NEGATIVE NEGATIVE   Leukocytes,Ua NEGATIVE NEGATIVE   Meds ordered this encounter  Medications   NIFEdipine (PROCARDIA-XL/NIFEDICAL-XL) 24 hr tablet 30 mg    Outpatient dose. Did not take this morning    Assessment and Plan  --41 y.o. [id number] at [redacted]w[redacted]d  --Dizziness in first trimester, no syncope or associated acute symptoms --Free T4 added to TSH, normal result --Reviewed timing of meals, nutritional needs in pregnancy --Discharge home in stable condition  F/U: --Patient is scheduled for New OB at [facility] on 07/04/2022  Darlina Rumpf, [place], MSN, CNM 06/01/2022, 12:35 PM

## 2022-06-01 NOTE — MAU Note (Signed)
Brittany [name] is a 41 y.o. at [redacted]w[redacted]d here in MAU via EMS reporting: she's been experiencing dizziness since last night.  Also reports nausea, denies emesis.  Reports "feels like going to pass out", but hasn't had a syncopal episode.    Denies pain or VB. LMP:  N/A Onset of complaint: last night Pain score: 0 Vitals:   06/01/22 0936  BP: (!) 140/81  Pulse: 86  Resp: 20  Temp: 98 F (36.7 C)  SpO2: 100%     FHT:N/A Lab orders placed from triage:   UA

## 2022-06-01 NOTE — Telephone Encounter (Signed)
Pt called to let us know she is going to MAU.  Pt notes feeling dizzy, light headed as if she is going to faint.onset last night. Pt is not able to drive. Pt states she is going go call EMS to take her.  I let pt know I wold make the provider aware.

## 2022-06-06 ENCOUNTER — Encounter: Payer: Self-pay | Admitting: Advanced Practice Midwife

## 2022-06-13 ENCOUNTER — Encounter (HOSPITAL_COMMUNITY): Payer: Self-pay | Admitting: Obstetrics and Gynecology

## 2022-06-13 ENCOUNTER — Encounter: Payer: Self-pay | Admitting: Advanced Practice Midwife

## 2022-06-13 ENCOUNTER — Inpatient Hospital Stay (HOSPITAL_COMMUNITY)
Admission: AD | Admit: 2022-06-13 | Discharge: 2022-06-13 | Disposition: A | Discharge status: Home / Self Care | Payer: Medicaid Other | Attending: Obstetrics and Gynecology | Admitting: Obstetrics and Gynecology

## 2022-06-13 DIAGNOSIS — R0981 Nasal congestion: Secondary | ICD-10-CM | POA: Insufficient documentation

## 2022-06-13 DIAGNOSIS — O26891 Other specified pregnancy related conditions, first trimester: Secondary | ICD-10-CM | POA: Insufficient documentation

## 2022-06-13 DIAGNOSIS — R110 Nausea: Secondary | ICD-10-CM | POA: Insufficient documentation

## 2022-06-13 DIAGNOSIS — O219 Vomiting of pregnancy, unspecified: Secondary | ICD-10-CM | POA: Diagnosis not present

## 2022-06-13 DIAGNOSIS — Z3A09 9 weeks gestation of pregnancy: Secondary | ICD-10-CM | POA: Insufficient documentation

## 2022-06-13 DIAGNOSIS — Z1152 Encounter for screening for COVID-19: Secondary | ICD-10-CM | POA: Insufficient documentation

## 2022-06-13 DIAGNOSIS — O10911 Unspecified pre-existing hypertension complicating pregnancy, first trimester: Secondary | ICD-10-CM | POA: Diagnosis present

## 2022-06-13 DIAGNOSIS — O09521 Supervision of elderly multigravida, first trimester: Secondary | ICD-10-CM | POA: Diagnosis present

## 2022-06-13 LAB — CBC WITH DIFFERENTIAL/PLATELET
Abs Immature Granulocytes: 0.06 10*3/uL (ref 0.00–0.07)
Basophils Absolute: 0.1 10*3/uL (ref 0.0–0.1)
Basophils Relative: 1 %
Eosinophils Absolute: 0.1 10*3/uL (ref 0.0–0.5)
Eosinophils Relative: 1 %
HCT: 35.6 % — ABNORMAL LOW (ref 36.0–46.0)
Hemoglobin: 11.9 g/dL — ABNORMAL LOW (ref 12.0–15.0)
Immature Granulocytes: 1 %
Lymphocytes Relative: 22 %
Lymphs Abs: 2.3 10*3/uL (ref 0.7–4.0)
MCH: 28.2 pg (ref 26.0–34.0)
MCHC: 33.4 g/dL (ref 30.0–36.0)
MCV: 84.4 fL (ref 80.0–100.0)
Monocytes Absolute: 0.8 10*3/uL (ref 0.1–1.0)
Monocytes Relative: 8 %
Neutro Abs: 7 10*3/uL (ref 1.7–7.7)
Neutrophils Relative %: 67 %
Platelets: 260 10*3/uL (ref 150–400)
RBC: 4.22 MIL/uL (ref 3.87–5.11)
RDW: 13.9 % (ref 11.5–15.5)
WBC: 10.4 10*3/uL (ref 4.0–10.5)
nRBC: 0 % (ref 0.0–0.2)

## 2022-06-13 LAB — URINALYSIS, ROUTINE W REFLEX MICROSCOPIC
Bilirubin Urine: NEGATIVE
Glucose, UA: NEGATIVE mg/dL
Hgb urine dipstick: NEGATIVE
Ketones, ur: NEGATIVE mg/dL
Leukocytes,Ua: NEGATIVE
Nitrite: NEGATIVE
Protein, ur: NEGATIVE mg/dL
Specific Gravity, Urine: 1.015 (ref 1.005–1.030)
pH: 8.5 — ABNORMAL HIGH (ref 5.0–8.0)

## 2022-06-13 LAB — BASIC METABOLIC PANEL
Anion gap: 9 (ref 5–15)
BUN: 7 mg/dL (ref 6–20)
CO2: 23 mmol/L (ref 22–32)
Calcium: 9 mg/dL (ref 8.9–10.3)
Chloride: 101 mmol/L (ref 98–111)
Creatinine, Ser: 0.69 mg/dL (ref 0.44–1.00)
GFR, Estimated: >60 mL/min (ref >60)
Glucose, Bld: 92 mg/dL (ref 70–99)
Potassium: 3.5 mmol/L (ref 3.5–5.1)
Sodium: 133 mmol/L — ABNORMAL LOW (ref 135–145)

## 2022-06-13 LAB — LIPASE, BLOOD: Lipase: 39 U/L (ref 11–51)

## 2022-06-13 LAB — RESP PANEL BY RT-PCR (FLU A&B, COVID) ARPGX2
Influenza A by PCR: NEGATIVE
Influenza B by PCR: NEGATIVE
SARS Coronavirus 2 by RT PCR: NEGATIVE

## 2022-06-13 MED ORDER — PROMETHAZINE HCL 25 MG PO TABS: 12.5000 mg | ORAL_TABLET | Freq: Four times a day (QID) | ORAL | 2 refills | Status: DC | PRN

## 2022-06-13 MED ORDER — ACETAMINOPHEN-CAFFEINE 500-65 MG PO TABS
2.0000 | ORAL_TABLET | Freq: Once | ORAL | Status: AC
  Administered 2022-06-13: 2 via ORAL
  Filled 2022-06-13: qty 2

## 2022-06-13 NOTE — Discharge Instructions (Signed)

## 2022-06-13 NOTE — MAU Note (Signed)
Brittany [name] is a 41 y.o. at [redacted]w[redacted]d here in MAU reporting: really bad back and abd pain, HA and she has a cold (runny nose). Has taken Tylenol, has not helped.  No bleeding.  Feeling pressure when she urinates. Has been constipated. No fever. Onset of complaint: 2 days Pain score: 8 for all Vitals:   06/13/22 1140 06/13/22 1143  BP:  137/82  Pulse:  70  Resp:  18  Temp:  98.9 F (37.2 C)  SpO2: 99% 99%      Lab orders placed from triage:  urine

## 2022-06-13 NOTE — MAU Provider Note (Signed)
History     CSN: [id number]  Arrival date and time: 06/13/22 1111   Event Date/Time   First Provider Initiated Contact with Patient 06/13/22 1309      Chief Complaint  Patient presents with   Headache   HPI Brittany [name] is a 41 y.o. [id number] with confirmed SIUP at [redacted]w[redacted]d . She presents to MAU with chief complaint of headache. This is a recurrent problem, onset two days ago. Patient has attempted management with Tylenol, last taken at 0800 this morning. She has not experienced relief. She denies aggravating or alleviating factors.   Patient also endorses multiple other complaints including persistent nausea without vomiting, pressure with urination, low back and lower abdominal pain, and runny nose. She denies aggravating or alleviating factors. She denies dysuria, fever, flank pain, cough, SOB. No sick contacts.   Patient receives care with [facility]. Her pregnancy is complicated by [facility] on Procardia XL 30 mg.  OB History     Gravida  10   Para  4   Term  2   Preterm  2   AB  5   Living  4      SAB  4   IAB  1   Ectopic  0   Multiple  0   Live Births  4           Past Medical History:  Diagnosis Date   Gallstone    Headache(784.0)    Heart murmur    Hypertension    Infection    UTI   Maternal chronic hypertension in first trimester 04/09/2011   Pregnancy induced hypertension    after 2nd delivery   Vaginal Pap smear, abnormal    cant remember    Past Surgical History:  Procedure Laterality Date   DILATION AND CURETTAGE OF UTERUS     dont remember year   LEEP      Family History  Problem Relation Age of Onset   Asthma Mother    Hypertension Mother    CAD Mother    Asthma Maternal Grandmother    Hypertension Maternal Grandmother     Social History   Tobacco Use   Smoking status: Never   Smokeless tobacco: Never  Vaping Use   Vaping Use: Never used  Substance Use Topics   Alcohol use: No    Alcohol/week: 0.0 standard  drinks of alcohol   Drug use: No    Allergies: No Known Allergies  Medications Prior to Admission  Medication Sig Dispense Refill Last Dose   acetaminophen (TYLENOL) 500 MG tablet Take 500 mg by mouth every 6 (six) hours as needed.   06/13/2022   metoCLOPramide (REGLAN) 10 MG tablet Take 1 tablet (10 mg total) by mouth every 6 (six) hours as needed for nausea or vomiting. 30 tablet 0 06/13/2022   NIFEdipine (PROCARDIA XL) 30 MG 24 hr tablet Take 1 tablet (30 mg total) by mouth daily. 30 tablet 5 06/13/2022   prenatal vitamin w/FE, FA (PRENATAL 1 + 1) 27-1 MG TABS tablet Take 1 tablet by mouth daily at 12 noon. 30 tablet 11 06/13/2022   scopolamine (TRANSDERM-SCOP) 1 MG/3DAYS Place 1 patch (1.5 mg total) onto the skin every 3 (three) days. 10 patch 12    [DISCONTINUED] promethazine (PHENERGAN) 25 MG tablet Take 0.5-1 tablets (12.5-25 mg total) by mouth every 6 (six) hours as needed for nausea or vomiting. 30 tablet 0 06/13/2022    Review of Systems  Constitutional:  Positive for fatigue.  HENT:  Positive for congestion.   Gastrointestinal:  Positive for abdominal pain and nausea.  Genitourinary:  Positive for dysuria.  Neurological:  Positive for headaches.  All other systems reviewed and are negative.  Physical Exam   Blood pressure 137/82, pulse 70, temperature 98.9 F (37.2 C), temperature source Oral, resp. rate 18, height 5\' 5"  (1.651 m), weight 80.6 kg, last menstrual period 04/07/2022, SpO2 99 %, currently breastfeeding.  Physical Exam Vitals and nursing note reviewed.  Constitutional:      Appearance: She is well-developed. She is not ill-appearing.  Cardiovascular:     Rate and Rhythm: Normal rate and regular rhythm.     Heart sounds: Normal heart sounds.  Pulmonary:     Effort: Pulmonary effort is normal.     Breath sounds: Normal breath sounds.  Abdominal:     General: Bowel sounds are normal.     Palpations: Abdomen is soft.  Skin:    General: Skin is warm.      Capillary Refill: Capillary refill takes less than 2 seconds.  Neurological:     Mental Status: She is alert and oriented to person, place, and time.  Psychiatric:        Mood and Affect: Mood normal.        Speech: Speech normal.        Behavior: Behavior normal.     MAU Course  Procedures  MDM  --Sinus congestion without associated acute symptoms. Advised OTC decongestant, saline nasal spray, daily Claritin or Zyrtec PRN  Orders Placed This Encounter  Procedures   Resp Panel by RT-PCR (Flu A&B, Covid) Anterior Nasal Swab   Urinalysis, Routine w reflex microscopic Urine, Clean Catch   CBC with Differential/Platelet   Basic metabolic panel   Lipase, blood   Airborne and Contact precautions   Discharge patient   Results for orders placed or performed during the hospital encounter of 06/13/22 (from the past 24 hour(s))  Resp Panel by RT-PCR (Flu A&B, Covid) Anterior Nasal Swab     Status: None   Collection Time: 06/13/22 11:48 AM   Specimen: Anterior Nasal Swab  Result Value Ref Range   SARS Coronavirus 2 by RT PCR NEGATIVE NEGATIVE   Influenza A by PCR NEGATIVE NEGATIVE   Influenza B by PCR NEGATIVE NEGATIVE  CBC with Differential/Platelet     Status: Abnormal   Collection Time: 06/13/22 12:05 PM  Result Value Ref Range   WBC 10.4 4.0 - 10.5 K/uL   RBC 4.22 3.87 - 5.11 MIL/uL   Hemoglobin 11.9 (L) 12.0 - 15.0 g/dL   HCT 06/15/22 (L) 89.2 - 11.9 %   MCV 84.4 80.0 - 100.0 fL   MCH 28.2 26.0 - 34.0 pg   MCHC 33.4 30.0 - 36.0 g/dL   RDW 41.7 40.8 - 14.4 %   Platelets 260 150 - 400 K/uL   nRBC 0.0 0.0 - 0.2 %   Neutrophils Relative % 67 %   Neutro Abs 7.0 1.7 - 7.7 K/uL   Lymphocytes Relative 22 %   Lymphs Abs 2.3 0.7 - 4.0 K/uL   Monocytes Relative 8 %   Monocytes Absolute 0.8 0.1 - 1.0 K/uL   Eosinophils Relative 1 %   Eosinophils Absolute 0.1 0.0 - 0.5 K/uL   Basophils Relative 1 %   Basophils Absolute 0.1 0.0 - 0.1 K/uL   Immature Granulocytes 1 %   Abs Immature  Granulocytes 0.06 0.00 - 0.07 K/uL  Basic metabolic panel     Status:  Abnormal   Collection Time: 06/13/22 12:05 PM  Result Value Ref Range   Sodium 133 (L) 135 - 145 mmol/L   Potassium 3.5 3.5 - 5.1 mmol/L   Chloride 101 98 - 111 mmol/L   CO2 23 22 - 32 mmol/L   Glucose, Bld 92 70 - 99 mg/dL   BUN 7 6 - 20 mg/dL   Creatinine, Ser 3.29 0.44 - 1.00 mg/dL   Calcium 9.0 8.9 - 51.8 mg/dL   GFR, Estimated >84 >16 mL/min   Anion gap 9 5 - 15  Lipase, blood     Status: None   Collection Time: 06/13/22 12:05 PM  Result Value Ref Range   Lipase 39 11 - 51 U/L  Urinalysis, Routine w reflex microscopic Urine, Clean Catch     Status: Abnormal   Collection Time: 06/13/22 12:06 PM  Result Value Ref Range   Color, Urine YELLOW YELLOW   APPearance CLOUDY (A) CLEAR   Specific Gravity, Urine 1.015 1.005 - 1.030   pH 8.5 (H) 5.0 - 8.0   Glucose, UA NEGATIVE NEGATIVE mg/dL   Hgb urine dipstick NEGATIVE NEGATIVE   Bilirubin Urine NEGATIVE NEGATIVE   Ketones, ur NEGATIVE NEGATIVE mg/dL   Protein, ur NEGATIVE NEGATIVE mg/dL   Nitrite NEGATIVE NEGATIVE   Leukocytes,Ua NEGATIVE NEGATIVE   Meds ordered this encounter  Medications   acetaminophen-caffeine (EXCEDRIN TENSION HEADACHE) 500-65 MG per tablet 2 tablet   promethazine (PHENERGAN) 25 MG tablet    Sig: Take 0.5-1 tablets (12.5-25 mg total) by mouth every 6 (six) hours as needed for nausea or vomiting.    Dispense:  30 tablet    Refill:  2    Order Specific Question:   Supervising Provider    Answer:   Levie Heritage [[id number]]   Assessment and Plan  --41 y.o. [id number] at [redacted]w[redacted]d  --SIUP confirmed 05/22/2022 --Sinus congestion --Persistent nausea without vomiting, weight stable --No acute processes or findings on physical exam --Phenergan, refills added to existing prescription --Discharge home in stable condition  Calvert Cantor, MSA, MSN, CNM 06/13/2022, 3:16 PM

## 2022-06-16 ENCOUNTER — Encounter: Payer: Self-pay | Admitting: Advanced Practice Midwife

## 2022-06-19 ENCOUNTER — Other Ambulatory Visit: Payer: Self-pay

## 2022-06-19 ENCOUNTER — Inpatient Hospital Stay (HOSPITAL_COMMUNITY)
Admission: AD | Admit: 2022-06-19 | Discharge: 2022-06-19 | Disposition: A | Discharge status: Home / Self Care | Payer: Medicaid Other | Attending: Obstetrics and Gynecology | Admitting: Obstetrics and Gynecology

## 2022-06-19 DIAGNOSIS — Z3491 Encounter for supervision of normal pregnancy, unspecified, first trimester: Secondary | ICD-10-CM

## 2022-06-19 DIAGNOSIS — O26899 Other specified pregnancy related conditions, unspecified trimester: Secondary | ICD-10-CM

## 2022-06-19 DIAGNOSIS — O09521 Supervision of elderly multigravida, first trimester: Secondary | ICD-10-CM | POA: Insufficient documentation

## 2022-06-19 DIAGNOSIS — Z3A10 10 weeks gestation of pregnancy: Secondary | ICD-10-CM | POA: Diagnosis not present

## 2022-06-19 DIAGNOSIS — O26851 Spotting complicating pregnancy, first trimester: Secondary | ICD-10-CM | POA: Diagnosis not present

## 2022-06-19 DIAGNOSIS — O26891 Other specified pregnancy related conditions, first trimester: Secondary | ICD-10-CM | POA: Diagnosis present

## 2022-06-19 LAB — URINALYSIS, ROUTINE W REFLEX MICROSCOPIC
Bilirubin Urine: NEGATIVE
Glucose, UA: NEGATIVE mg/dL
Hgb urine dipstick: NEGATIVE
Ketones, ur: NEGATIVE mg/dL
Leukocytes,Ua: NEGATIVE
Nitrite: NEGATIVE
Protein, ur: NEGATIVE mg/dL
Specific Gravity, Urine: 1.014 (ref 1.005–1.030)
pH: 7 (ref 5.0–8.0)

## 2022-06-19 MED ORDER — CYCLOBENZAPRINE HCL 5 MG PO TABS
10.0000 mg | ORAL_TABLET | Freq: Once | ORAL | Status: AC
  Administered 2022-06-19: 10 mg via ORAL
  Filled 2022-06-19: qty 2

## 2022-06-19 MED ORDER — CYCLOBENZAPRINE HCL 10 MG PO TABS: 10.0000 mg | ORAL_TABLET | Freq: Two times a day (BID) | ORAL | 0 refills | Status: DC | PRN

## 2022-06-19 MED ORDER — ACETAMINOPHEN 500 MG PO TABS
1000.0000 mg | ORAL_TABLET | Freq: Once | ORAL | Status: AC
  Administered 2022-06-19: 1000 mg via ORAL
  Filled 2022-06-19: qty 2

## 2022-06-19 NOTE — MAU Provider Note (Signed)
History     CSN: [id number]  Arrival date and time: 06/19/22 1844   Event Date/Time   First Provider Initiated Contact with Patient 06/19/22 2004      Chief Complaint  Patient presents with   Abdominal Pain   HPI Presents with abdominal pain and spotting. She states yesterday on 06/18/2022 her toddler jumped on her abdomen. Her existing abdominal pain and spotting worsened. Spotting has resolved but on arrival to MAU she endorses lower abdominal pain with score os 8/10. She has not taken medication for this complaint. She denies dysuria, abdominal tenderness, fever or recent illness.  Patient receives care with [facility].  OB History     Gravida  10   Para  4   Term  2   Preterm  2   AB  5   Living  4      SAB  4   IAB  1   Ectopic  0   Multiple  0   Live Births  4           Past Medical History:  Diagnosis Date   Gallstone    Headache(784.0)    Heart murmur    Hypertension    Infection    UTI   Maternal chronic hypertension in first trimester 04/09/2011   Pregnancy induced hypertension    after 2nd delivery   Vaginal Pap smear, abnormal    cant remember    Past Surgical History:  Procedure Laterality Date   DILATION AND CURETTAGE OF UTERUS     dont remember year   LEEP      Family History  Problem Relation Age of Onset   Asthma Mother    Hypertension Mother    CAD Mother    Asthma Maternal Grandmother    Hypertension Maternal Grandmother     Social History   Tobacco Use   Smoking status: Never   Smokeless tobacco: Never  Vaping Use   Vaping Use: Never used  Substance Use Topics   Alcohol use: No    Alcohol/week: 0.0 standard drinks of alcohol   Drug use: No    Allergies: No Known Allergies  Medications Prior to Admission  Medication Sig Dispense Refill Last Dose   scopolamine (TRANSDERM-SCOP) 1 MG/3DAYS Place 1 patch (1.5 mg total) onto the skin every 3 (three) days. 10 patch 12    acetaminophen (TYLENOL) 500 MG tablet  Take 500 mg by mouth every 6 (six) hours as needed.      metoCLOPramide (REGLAN) 10 MG tablet Take 1 tablet (10 mg total) by mouth every 6 (six) hours as needed for nausea or vomiting. 30 tablet 0    NIFEdipine (PROCARDIA XL) 30 MG 24 hr tablet Take 1 tablet (30 mg total) by mouth daily. 30 tablet 5    prenatal vitamin w/FE, FA (PRENATAL 1 + 1) 27-1 MG TABS tablet Take 1 tablet by mouth daily at 12 noon. 30 tablet 11    promethazine (PHENERGAN) 25 MG tablet Take 0.5-1 tablets (12.5-25 mg total) by mouth every 6 (six) hours as needed for nausea or vomiting. 30 tablet 2     Review of Systems  Gastrointestinal:  Positive for abdominal pain.  Genitourinary:  Positive for vaginal bleeding.  All other systems reviewed and are negative.  Physical Exam   Blood pressure 126/85, pulse 81, temperature 98.5 F (36.9 C), temperature source Oral, resp. rate 19, height 5\' 5"  (1.651 m), weight 80.6 kg, last menstrual period 04/07/2022, SpO2 99 %, currently  breastfeeding.  Physical Exam Vitals and nursing note reviewed. Exam conducted with a chaperone present.  Constitutional:      Appearance: She is well-developed. She is not ill-appearing.  Cardiovascular:     Rate and Rhythm: Normal rate.  Pulmonary:     Effort: Pulmonary effort is normal.  Abdominal:     General: Abdomen is flat.     Palpations: Abdomen is soft.     Tenderness: There is no abdominal tenderness.  Skin:    Capillary Refill: Capillary refill takes less than 2 seconds.  Neurological:     Mental Status: She is alert and oriented to person, place, and time.  Psychiatric:        Mood and Affect: Mood normal.        Behavior: Behavior normal.     MAU Course  Procedures  MDM Patient Vitals for the past 24 hrs:  BP Temp Temp src Pulse Resp SpO2 Height Weight  06/19/22 2047 120/77 -- -- 70 -- -- -- --  06/19/22 1857 126/85 98.5 F (36.9 C) Oral 81 19 99 % -- --  06/19/22 1854 -- -- -- -- -- -- 5\' 5"  (1.651 m) 80.6 kg     Results for orders placed or performed during the hospital encounter of 06/19/22 (from the past 24 hour(s))  Urinalysis, Routine w reflex microscopic Urine, Clean Catch     Status: None   Collection Time: 06/19/22  7:10 PM  Result Value Ref Range   Color, Urine YELLOW YELLOW   APPearance CLEAR CLEAR   Specific Gravity, Urine 1.014 1.005 - 1.030   pH 7.0 5.0 - 8.0   Glucose, UA NEGATIVE NEGATIVE mg/dL   Hgb urine dipstick NEGATIVE NEGATIVE   Bilirubin Urine NEGATIVE NEGATIVE   Ketones, ur NEGATIVE NEGATIVE mg/dL   Protein, ur NEGATIVE NEGATIVE mg/dL   Nitrite NEGATIVE NEGATIVE   Leukocytes,Ua NEGATIVE NEGATIVE   Meds ordered this encounter  Medications   acetaminophen (TYLENOL) tablet 1,000 mg   cyclobenzaprine (FLEXERIL) tablet 10 mg   cyclobenzaprine (FLEXERIL) 10 MG tablet    Sig: Take 1 tablet (10 mg total) by mouth 2 (two) times daily as needed for up to 10 doses for muscle spasms.    Dispense:  10 tablet    Refill:  0    Order Specific Question:   Supervising Provider    Answer:   14/03/23 Milas Hock   Assessment and Plan  --41 y.o. 24 at [redacted]w[redacted]d  --FHT 163 by Doppler --Spotting resolved prior to MAU encounter --Reassured uterus is still pelvic organ, protected from accidental trauma --Tylenol and Flexeril as needed --Discharge home in stable condition with first trimester precautions  F/U: --New OB is scheduled for 07/04/2022 at [facility], [facility], MSN, CNM 06/19/2022, 12:58 AM

## 2022-06-19 NOTE — MAU Note (Signed)
Brittany [name] is a 41 y.o. at [redacted]w[redacted]d here in MAU reporting: she was having abdominal pain and cramping and cramping worsened after 54 year old child accidentally kicked her stomach.  Reports had spotting earlier today, but none currently. LMP: N/A Onset of complaint: today Pain score: 8 Vitals:   06/19/22 1857  BP: 126/85  Pulse: 81  Resp: 19  Temp: 98.5 F (36.9 C)  SpO2: 99%     FHT: 163 bpm Lab orders placed from triage:   UA

## 2022-07-04 ENCOUNTER — Other Ambulatory Visit (HOSPITAL_COMMUNITY)
Admission: RE | Admit: 2022-07-04 | Discharge: 2022-07-04 | Disposition: A | Discharge status: Home / Self Care | Payer: Medicaid Other | Source: Ambulatory Visit | Attending: Family Medicine | Admitting: Family Medicine

## 2022-07-04 ENCOUNTER — Encounter: Payer: Self-pay | Admitting: Family Medicine

## 2022-07-04 ENCOUNTER — Ambulatory Visit (INDEPENDENT_AMBULATORY_CARE_PROVIDER_SITE_OTHER): Payer: Medicaid Other | Admitting: Family Medicine

## 2022-07-04 VITALS — BP 145/97 | HR 81 | Wt 176.0 lb

## 2022-07-04 DIAGNOSIS — O0991 Supervision of high risk pregnancy, unspecified, first trimester: Secondary | ICD-10-CM | POA: Diagnosis not present

## 2022-07-04 DIAGNOSIS — O119 Pre-existing hypertension with pre-eclampsia, unspecified trimester: Secondary | ICD-10-CM

## 2022-07-04 DIAGNOSIS — O99341 Other mental disorders complicating pregnancy, first trimester: Secondary | ICD-10-CM | POA: Diagnosis not present

## 2022-07-04 DIAGNOSIS — E0590 Thyrotoxicosis, unspecified without thyrotoxic crisis or storm: Secondary | ICD-10-CM | POA: Diagnosis not present

## 2022-07-04 DIAGNOSIS — F32A Depression, unspecified: Secondary | ICD-10-CM

## 2022-07-04 DIAGNOSIS — O111 Pre-existing hypertension with pre-eclampsia, first trimester: Secondary | ICD-10-CM

## 2022-07-04 DIAGNOSIS — Z3A12 12 weeks gestation of pregnancy: Secondary | ICD-10-CM | POA: Diagnosis not present

## 2022-07-04 DIAGNOSIS — O99340 Other mental disorders complicating pregnancy, unspecified trimester: Secondary | ICD-10-CM

## 2022-07-04 DIAGNOSIS — O0990 Supervision of high risk pregnancy, unspecified, unspecified trimester: Secondary | ICD-10-CM | POA: Insufficient documentation

## 2022-07-04 MED ORDER — ASPIRIN 81 MG PO TBEC: 81.0000 mg | DELAYED_RELEASE_TABLET | Freq: Every day | ORAL | 2 refills | Status: DC

## 2022-07-04 NOTE — Progress Notes (Signed)
INITIAL PRENATAL VISIT  Subjective:   Brittany [name] is being seen today for her first obstetrical visit.  This is a planned pregnancy. This is a desired pregnancy.  She is at [redacted]w[redacted]d gestation by LMP Her obstetrical history is significant for advanced maternal age and Chronic HTN . Relationship with FOB: spouse, living together. Patient does intend to breast feed. Pregnancy history fully reviewed.  Patient reports no complaints.  Indications for ASA therapy (per uptodate) One of the following: Previous pregnancy with preeclampsia, especially early onset and with an adverse outcome No Multifetal gestation No Chronic hypertension Yes Type 1 or 2 diabetes mellitus No Chronic kidney disease No Autoimmune disease (antiphospholipid syndrome, systemic lupus erythematosus) No  Two or more of the following: Nulliparity No Obesity (body mass index >30 kg/m2) No Family history of preeclampsia in mother or sister No Age ?35 years Yes Sociodemographic characteristics (African American race, low socioeconomic level) Yes Personal risk factors (eg, previous pregnancy with low birth weight or small for gestational age infant, previous adverse pregnancy outcome [eg, stillbirth], interval >10 years between pregnancies) No  Indications for early GDM screening  - Ordered HA1c   Review of Systems:   Review of Systems  Objective:    Obstetric History OB History  Gravida Para Term Preterm AB Living  10 4 2 2 5 4   SAB IAB Ectopic Multiple Live Births  4 1 0 0 4    # Outcome Date GA Lbr Len/2nd Weight Sex Delivery Anes PTL Lv  10 Current           9 Preterm 06/13/18 [redacted]w[redacted]d 07:04 / 00:15 5 lb 1.1 oz (2.3 kg) F Vag-Spont EPI  LIV  8 Term 05/03/16 [redacted]w[redacted]d 00:08 / 00:01 4 lb 12.7 oz (2.175 kg) M Vag-Spont None  LIV  7 Preterm 04/06/11 [redacted]w[redacted]d 08:25 / 00:08 5 lb 6.6 oz (2.455 kg) F Vag-Spont None  LIV     Complications: PIH (pregnancy induced hypertension)  6 Term 10/21/05 [redacted]w[redacted]d    Vag-Spont   LIV      Complications: Failure to Progress in Second Stage  5 IAB           4 SAB           3 SAB           2 SAB           1 SAB             Past Medical History:  Diagnosis Date   Gallstone    Headache(784.0)    Heart murmur    Hypertension    Infection    UTI   Maternal chronic hypertension in first trimester 04/09/2011   Pregnancy induced hypertension    after 2nd delivery   Vaginal Pap smear, abnormal    cant remember    Past Surgical History:  Procedure Laterality Date   DILATION AND CURETTAGE OF UTERUS     dont remember year   LEEP      Current Outpatient Medications on File Prior to Visit  Medication Sig Dispense Refill   acetaminophen (TYLENOL) 500 MG tablet Take 500 mg by mouth every 6 (six) hours as needed.     cyclobenzaprine (FLEXERIL) 10 MG tablet Take 1 tablet (10 mg total) by mouth 2 (two) times daily as needed for up to 10 doses for muscle spasms. 10 tablet 0   metoCLOPramide (REGLAN) 10 MG tablet Take 1 tablet (10 mg total) by mouth every 6 (six)  hours as needed for nausea or vomiting. 30 tablet 0   NIFEdipine (PROCARDIA XL) 30 MG 24 hr tablet Take 1 tablet (30 mg total) by mouth daily. 30 tablet 5   prenatal vitamin w/FE, FA (PRENATAL 1 + 1) 27-1 MG TABS tablet Take 1 tablet by mouth daily at 12 noon. 30 tablet 11   promethazine (PHENERGAN) 25 MG tablet Take 0.5-1 tablets (12.5-25 mg total) by mouth every 6 (six) hours as needed for nausea or vomiting. 30 tablet 2   scopolamine (TRANSDERM-SCOP) 1 MG/3DAYS Place 1 patch (1.5 mg total) onto the skin every 3 (three) days. (Patient not taking: Reported on 07/04/2022) 10 patch 12   [DISCONTINUED] cetirizine (ZYRTEC ALLERGY) 10 MG tablet Take 1 tablet (10 mg total) by mouth daily. 30 tablet 2   [DISCONTINUED] ipratropium (ATROVENT) 0.06 % nasal spray Place 2 sprays into both nostrils 4 (four) times daily. 15 mL 0   No current facility-administered medications on file prior to visit.    No Known  Allergies  Social History:  reports that she has never smoked. She has never used smokeless tobacco. She reports that she does not drink alcohol and does not use drugs.  Family History  Problem Relation Age of Onset   Asthma Mother    Hypertension Mother    CAD Mother    Asthma Maternal Grandmother    Hypertension Maternal Grandmother     The following portions of the patient's history were reviewed and updated as appropriate: allergies, current medications, past family history, past medical history, past social history, past surgical history and problem list.  Review of Systems Review of Systems  Constitutional:  Negative for chills and fever.  HENT:  Negative for congestion and sore throat.   Eyes:  Negative for pain and visual disturbance.  Respiratory:  Negative for cough, chest tightness and shortness of breath.   Cardiovascular:  Negative for chest pain.  Gastrointestinal:  Negative for abdominal pain, diarrhea, nausea and vomiting.  Endocrine: Negative for cold intolerance and heat intolerance.  Genitourinary:  Negative for dysuria and flank pain.  Musculoskeletal:  Negative for back pain.  Skin:  Negative for rash.  Allergic/Immunologic: Negative for food allergies.  Neurological:  Negative for dizziness and light-headedness.  Psychiatric/Behavioral:  Negative for agitation.       Physical Exam:  BP (!) 145/97   Pulse 81   Wt 176 lb (79.8 kg)   LMP 04/07/2022 (Exact Date)   BMI 29.29 kg/m  CONSTITUTIONAL: Well-developed, well-nourished female in no acute distress.  HENT:  Normocephalic, atraumatic, External right and left ear normal. Oropharynx is clear and moist EYES: Conjunctivae normal. No scleral icterus.  NECK: Normal range of motion, supple, no masses.  Normal thyroid.  SKIN: Skin is warm and dry. No rash noted. Not diaphoretic. No erythema. No pallor. MUSCULOSKELETAL: Normal range of motion. No tenderness.  No cyanosis, clubbing, or edema.   NEUROLOGIC:  Alert and oriented to person, place, and time. Normal muscle tone coordination.  PSYCHIATRIC: Normal mood and affect. Normal behavior. Normal judgment and thought content. CARDIOVASCULAR: Normal heart rate noted, regular rhythm RESPIRATORY: Clear to auscultation bilaterally. Effort and breath sounds normal, no problems with respiration noted. BREASTS: Symmetric in size. No masses, skin changes, nipple drainage, or lymphadenopathy. ABDOMEN: Soft, normal bowel sounds, no distention noted.  No tenderness, rebound or guarding. Fundal ht: 12 PELVIC: not indicated FHR: 164   Assessment:    Pregnancy: G1P0000 1. Supervision of high risk pregnancy, antepartum - CBC/D/Plt+RPR+Rh+ABO+RubIgG... - Culture,  OB Urine - PANORAMA PRENATAL TEST FULL PANEL - Urine cytology ancillary only - [place] MFM OB DETAIL +14 WK; Future - Comprehensive metabolic panel - Protein / creatinine ratio, urine - Hemoglobin A1c  2. Depression affecting pregnancy  3. Chronic hypertension with superimposed preeclampsia BP elevated but did not take her medications today ASA prescribed Delivered at 35 weeks for superimposed PEC in 2019  4. Subclinical hyperthyroidism TSH/Free T4     Plan:     Initial labs drawn. Prenatal vitamins. Problem list reviewed and updated. Reviewed in detail the nature of the practice with collaborative care between  Genetic screening discussed: NIPS  ordered. Role of ultrasound in pregnancy discussed; Anatomy US: ordered. Amniocentesis discussed: not indicated. Follow up in 4 weeks. Discussed clinic routines, schedule of care and testing, genetic screening options, involvement of students and residents under the direct supervision of APPs and doctors and presence of female providers. Pt verbalized understanding.   Federico Flake, MD 07/04/2022 3:34 PM

## 2022-07-04 NOTE — Progress Notes (Signed)
NOB [redacted]w[redacted]d  Flu Vaccine:already received  Last Pap: 02/10/2022 Genetic Screening: Desires and wants to know Gender  CC: Stomach pain that comes and goes. Pt unsure if she had a stomach bug. Pt denies any diarrhea or vomiting notes Nausea.

## 2022-07-05 LAB — COMPREHENSIVE METABOLIC PANEL
ALT: 27 IU/L (ref 0–32)
AST: 23 IU/L (ref 0–40)
Albumin/Globulin Ratio: 1.4 (ref 1.2–2.2)
Albumin: 4.1 g/dL (ref 3.9–4.9)
Alkaline Phosphatase: 57 IU/L (ref 44–121)
BUN/Creatinine Ratio: 13 (ref 9–23)
BUN: 9 mg/dL (ref 6–20)
Bilirubin Total: <0.2 mg/dL (ref 0.0–1.2)
CO2: 21 mmol/L (ref 20–29)
Calcium: 9.2 mg/dL (ref 8.7–10.2)
Chloride: 101 mmol/L (ref 96–106)
Creatinine, Ser: 0.67 mg/dL (ref 0.57–1.00)
Globulin, Total: 2.9 g/dL (ref 1.5–4.5)
Glucose: 94 mg/dL (ref 70–99)
Potassium: 4 mmol/L (ref 3.5–5.2)
Sodium: 135 mmol/L (ref 134–144)
Total Protein: 7 g/dL (ref 6.0–8.5)
eGFR: 114 mL/min/{1.73_m2} (ref >59)

## 2022-07-05 LAB — CBC/D/PLT+RPR+RH+ABO+RUBIGG...
Antibody Screen: NEGATIVE
Basophils Absolute: 0 10*3/uL (ref 0.0–0.2)
Basos: 0 %
EOS (ABSOLUTE): 0.1 10*3/uL (ref 0.0–0.4)
Eos: 1 %
HCV Ab: NONREACTIVE
HIV Screen 4th Generation wRfx: NONREACTIVE
Hematocrit: 37.4 % (ref 34.0–46.6)
Hemoglobin: 11.9 g/dL (ref 11.1–15.9)
Hepatitis B Surface Ag: NEGATIVE
Immature Grans (Abs): 0 10*3/uL (ref 0.0–0.1)
Immature Granulocytes: 0 %
Lymphocytes Absolute: 2.1 10*3/uL (ref 0.7–3.1)
Lymphs: 20 %
MCH: 27.2 pg (ref 26.6–33.0)
MCHC: 31.8 g/dL (ref 31.5–35.7)
MCV: 85 fL (ref 79–97)
Monocytes Absolute: 0.6 10*3/uL (ref 0.1–0.9)
Monocytes: 6 %
Neutrophils Absolute: 7.7 10*3/uL — ABNORMAL HIGH (ref 1.4–7.0)
Neutrophils: 73 %
Platelets: 277 10*3/uL (ref 150–450)
RBC: 4.38 x10E6/uL (ref 3.77–5.28)
RDW: 12.7 % (ref 11.7–15.4)
RPR Ser Ql: NONREACTIVE
Rh Factor: POSITIVE
Rubella Antibodies, IGG: 3.32 index (ref >0.99)
WBC: 10.5 10*3/uL (ref 3.4–10.8)

## 2022-07-05 LAB — TSH+FREE T4
Free T4: 1.21 ng/dL (ref 0.82–1.77)
TSH: 0.248 u[IU]/mL — ABNORMAL LOW (ref 0.450–4.500)

## 2022-07-05 LAB — PROTEIN / CREATININE RATIO, URINE
Creatinine, Urine: 64.1 mg/dL
Protein, Ur: 10.7 mg/dL
Protein/Creat Ratio: 167 mg/g creat (ref 0–200)

## 2022-07-05 LAB — HEMOGLOBIN A1C
Est. average glucose Bld gHb Est-mCnc: 114 mg/dL
Hgb A1c MFr Bld: 5.6 % (ref 4.8–5.6)

## 2022-07-05 LAB — HCV INTERPRETATION

## 2022-07-06 LAB — URINE CYTOLOGY ANCILLARY ONLY
Chlamydia: NEGATIVE
Comment: NEGATIVE
Comment: NORMAL
Neisseria Gonorrhea: NEGATIVE

## 2022-07-06 LAB — CULTURE, OB URINE

## 2022-07-06 LAB — URINE CULTURE, OB REFLEX

## 2022-07-10 LAB — PANORAMA PRENATAL TEST FULL PANEL:PANORAMA TEST PLUS 5 ADDITIONAL MICRODELETIONS: FETAL FRACTION: 5.2

## 2022-07-15 ENCOUNTER — Inpatient Hospital Stay (HOSPITAL_COMMUNITY)
Admission: AD | Admit: 2022-07-15 | Discharge: 2022-07-15 | Disposition: A | Discharge status: Home / Self Care | Payer: Medicaid Other | Attending: Obstetrics and Gynecology | Admitting: Obstetrics and Gynecology

## 2022-07-15 DIAGNOSIS — R109 Unspecified abdominal pain: Secondary | ICD-10-CM | POA: Diagnosis present

## 2022-07-15 DIAGNOSIS — B9689 Other specified bacterial agents as the cause of diseases classified elsewhere: Secondary | ICD-10-CM | POA: Insufficient documentation

## 2022-07-15 DIAGNOSIS — O26892 Other specified pregnancy related conditions, second trimester: Secondary | ICD-10-CM | POA: Insufficient documentation

## 2022-07-15 DIAGNOSIS — O26899 Other specified pregnancy related conditions, unspecified trimester: Secondary | ICD-10-CM | POA: Diagnosis not present

## 2022-07-15 DIAGNOSIS — N760 Acute vaginitis: Secondary | ICD-10-CM | POA: Diagnosis not present

## 2022-07-15 DIAGNOSIS — Z3A14 14 weeks gestation of pregnancy: Secondary | ICD-10-CM | POA: Insufficient documentation

## 2022-07-15 DIAGNOSIS — O23592 Infection of other part of genital tract in pregnancy, second trimester: Secondary | ICD-10-CM | POA: Insufficient documentation

## 2022-07-15 LAB — URINALYSIS, ROUTINE W REFLEX MICROSCOPIC
Bilirubin Urine: NEGATIVE
Glucose, UA: NEGATIVE mg/dL
Hgb urine dipstick: NEGATIVE
Ketones, ur: 80 mg/dL — AB
Leukocytes,Ua: NEGATIVE
Nitrite: NEGATIVE
Protein, ur: NEGATIVE mg/dL
Specific Gravity, Urine: 1.025 (ref 1.005–1.030)
pH: 5 (ref 5.0–8.0)

## 2022-07-15 LAB — WET PREP, GENITAL
Sperm: NONE SEEN
Trich, Wet Prep: NONE SEEN
WBC, Wet Prep HPF POC: <10 (ref <10)
Yeast Wet Prep HPF POC: NONE SEEN

## 2022-07-15 MED ORDER — METRONIDAZOLE 500 MG PO TABS: 500.0000 mg | ORAL_TABLET | Freq: Two times a day (BID) | ORAL | 0 refills | Status: AC

## 2022-07-15 NOTE — MAU Provider Note (Cosign Needed)
Chief Complaint: Abdominal Pain   Event Date/Time   First Provider Initiated Contact with Patient 07/15/22 2308      SUBJECTIVE HPI: Brittany [name] is a 41 y.o. [id number] at [redacted]w[redacted]d who presents to maternity admissions reporting lower abdominal cramping x 24 hours. She reports intercourse last night, with cramping worse after intercourse.  She has been drinking water all day and has taken Tylenol but the pain has not improved.  There are no other associated symptoms.   HPI  Past Medical History:  Diagnosis Date   Gallstone    Headache(784.0)    Heart murmur    Hypertension    Infection    UTI   Maternal chronic hypertension in first trimester 04/09/2011   Pregnancy induced hypertension    after 2nd delivery   Vaginal Pap smear, abnormal    cant remember   Past Surgical History:  Procedure Laterality Date   DILATION AND CURETTAGE OF UTERUS     dont remember year   LEEP     Social History   Socioeconomic History   Marital status: Married    Spouse name: Not on file   Number of children: Not on file   Years of education: Not on file   Highest education level: Not on file  Occupational History   Not on file  Tobacco Use   Smoking status: Never   Smokeless tobacco: Never  Vaping Use   Vaping Use: Never used  Substance and Sexual Activity   Alcohol use: No    Alcohol/week: 0.0 standard drinks of alcohol   Drug use: No   Sexual activity: Yes  Other Topics Concern   Not on file  Social History Narrative   Not on file   Social Determinants of Health   Financial Resource Strain: Not on file  Food Insecurity: Not on file  Transportation Needs: Not on file  Physical Activity: Not on file  Stress: Not on file  Social Connections: Not on file  Intimate Partner Violence: Not on file   No current facility-administered medications on file prior to encounter.   Current Outpatient Medications on File Prior to Encounter  Medication Sig Dispense Refill   scopolamine  (TRANSDERM-SCOP) 1 MG/3DAYS Place 1 patch (1.5 mg total) onto the skin every 3 (three) days. (Patient not taking: Reported on 07/04/2022) 10 patch 12   acetaminophen (TYLENOL) 500 MG tablet Take 500 mg by mouth every 6 (six) hours as needed.     aspirin EC 81 MG tablet Take 1 tablet (81 mg total) by mouth daily. Take after 12 weeks for prevention of preeclampsia later in pregnancy 300 tablet 2   cyclobenzaprine (FLEXERIL) 10 MG tablet Take 1 tablet (10 mg total) by mouth 2 (two) times daily as needed for up to 10 doses for muscle spasms. 10 tablet 0   metoCLOPramide (REGLAN) 10 MG tablet Take 1 tablet (10 mg total) by mouth every 6 (six) hours as needed for nausea or vomiting. 30 tablet 0   NIFEdipine (PROCARDIA XL) 30 MG 24 hr tablet Take 1 tablet (30 mg total) by mouth daily. 30 tablet 5   prenatal vitamin w/FE, FA (PRENATAL 1 + 1) 27-1 MG TABS tablet Take 1 tablet by mouth daily at 12 noon. 30 tablet 11   promethazine (PHENERGAN) 25 MG tablet Take 0.5-1 tablets (12.5-25 mg total) by mouth every 6 (six) hours as needed for nausea or vomiting. 30 tablet 2   [DISCONTINUED] cetirizine (ZYRTEC ALLERGY) 10 MG tablet Take 1 tablet (  10 mg total) by mouth daily. 30 tablet 2   [DISCONTINUED] ipratropium (ATROVENT) 0.06 % nasal spray Place 2 sprays into both nostrils 4 (four) times daily. 15 mL 0   No Known Allergies  ROS:  Review of Systems   I have reviewed patient's Past Medical Hx, Surgical Hx, Family Hx, Social Hx, medications and allergies.   Physical Exam  Patient Vitals for the past 24 hrs:  BP Temp Temp src Pulse Resp Height Weight  07/15/22 2214 126/78 98.3 F (36.8 C) Oral 76 16 5\' 5"  (1.651 m) 78 kg   Constitutional: Well-developed, well-nourished female in no acute distress.  Cardiovascular: normal rate Respiratory: normal effort GI: Abd soft, non-tender. Pos BS x 4 MS: Extremities nontender, no edema, normal ROM Neurologic: Alert and oriented x 4.  GU: Neg CVAT.  PELVIC EXAM:   Cervix 0/thick/high  FHT 160 by doppler  LAB RESULTS Results for orders placed or performed during the hospital encounter of 07/15/22 (from the past 24 hour(s))  Urinalysis, Routine w reflex microscopic Urine, Clean Catch     Status: Abnormal   Collection Time: 07/15/22 10:24 PM  Result Value Ref Range   Color, Urine YELLOW YELLOW   APPearance HAZY (A) CLEAR   Specific Gravity, Urine 1.025 1.005 - 1.030   pH 5.0 5.0 - 8.0   Glucose, UA NEGATIVE NEGATIVE mg/dL   Hgb urine dipstick NEGATIVE NEGATIVE   Bilirubin Urine NEGATIVE NEGATIVE   Ketones, ur 80 (A) NEGATIVE mg/dL   Protein, ur NEGATIVE NEGATIVE mg/dL   Nitrite NEGATIVE NEGATIVE   Leukocytes,Ua NEGATIVE NEGATIVE  Wet prep, genital     Status: Abnormal   Collection Time: 07/15/22 10:24 PM  Result Value Ref Range   Yeast Wet Prep HPF POC NONE SEEN NONE SEEN   Trich, Wet Prep NONE SEEN NONE SEEN   Clue Cells Wet Prep HPF POC PRESENT (A) NONE SEEN   WBC, Wet Prep HPF POC <10 <10   Sperm NONE SEEN     O/Positive/-- (12/18 1538)  IMAGING No results found.  MAU Management/MDM: Orders Placed This Encounter  Procedures   Wet prep, genital   Culture, OB Urine   Urinalysis, Routine w reflex microscopic Urine, Clean Catch   Discharge patient    Meds ordered this encounter  Medications   metroNIDAZOLE (FLAGYL) 500 MG tablet    Sig: Take 1 tablet (500 mg total) by mouth 2 (two) times daily for 7 days.    Dispense:  14 tablet    Refill:  0    Order Specific Question:   Supervising Provider    Answer:   [place] Mode [id number]    Cervix closed, no evidence of preterm labor.  Pt reports increased discharge with slight odor and clue cells on vaginal swab today. Will treat for BV with Flagyl BID x 7 days.  Note provided for pt to miss work x 2 days to rest.  Pt reports drinking lots of water today but 80 ketones in urine. Pt to continue increased fluid intake, rest/ice/heat/warm bath. Return to MAU with worsening symptoms.    ASSESSMENT 1. Bacterial vaginosis   2. [redacted] weeks gestation of pregnancy   3. Abdominal pain affecting pregnancy     PLAN Discharge home Allergies as of 07/15/2022   No Known Allergies      Medication List     TAKE these medications    acetaminophen 500 MG tablet Commonly known as: TYLENOL Take 500 mg by mouth every 6 (six) hours  as needed.   aspirin EC 81 MG tablet Take 1 tablet (81 mg total) by mouth daily. Take after 12 weeks for prevention of preeclampsia later in pregnancy   cyclobenzaprine 10 MG tablet Commonly known as: FLEXERIL Take 1 tablet (10 mg total) by mouth 2 (two) times daily as needed for up to 10 doses for muscle spasms.   metoCLOPramide 10 MG tablet Commonly known as: REGLAN Take 1 tablet (10 mg total) by mouth every 6 (six) hours as needed for nausea or vomiting.   metroNIDAZOLE 500 MG tablet Commonly known as: FLAGYL Take 1 tablet (500 mg total) by mouth 2 (two) times daily for 7 days.   NIFEdipine 30 MG 24 hr tablet Commonly known as: Procardia XL Take 1 tablet (30 mg total) by mouth daily.   prenatal vitamin w/FE, FA 27-1 MG Tabs tablet Take 1 tablet by mouth daily at 12 noon.   promethazine 25 MG tablet Commonly known as: PHENERGAN Take 0.5-1 tablets (12.5-25 mg total) by mouth every 6 (six) hours as needed for nausea or vomiting.   scopolamine 1 MG/3DAYS Commonly known as: TRANSDERM-SCOP Place 1 patch (1.5 mg total) onto the skin every 3 (three) days.        Follow-up [place] [facility] at [facility] Follow up.   Specialty: Obstetrics and Gynecology Why: As scheduled Contact information: [place] [facility] Follow up.   Specialty: Obstetrics and Gynecology Why: As needed for emergencies Contact information: [address] [id number] [place] Certified Nurse-Midwife 07/15/2022  11:50 PM

## 2022-07-15 NOTE — MAU Note (Signed)
Pt says has lower abd cramps- started this am  Last sex- before cramps started. Then afterwards - cramps worse  [facility]- [facility]

## 2022-07-17 LAB — CULTURE, OB URINE

## 2022-07-18 NOTE — L&D Delivery Note (Signed)
OB/GYN Faculty Practice Delivery Note  Brittany [name] is a 42 y.o. [id number] s/p SVD at [redacted]w[redacted]d. She was admitted for IOL Pre-eclampsia with severe features.   ROM: 3h 106m with clear fluid GBS Status: unknown- PCN   Maximum Maternal Temperature: 97.83F  Labor Progress: Initial SVE: 2/70/-2. She then progressed to complete.   Delivery Date/Time: 12/13/22  1231 Delivery: Called to room and patient was complete and pushing. Head delivered ROA. No nuchal cord present. Shoulder and body delivered in usual fashion. Infant with spontaneous cry, placed on mother's abdomen, dried and stimulated. Cord clamped x 2 after 1-minute delay, and cut by mom. Cord blood drawn. Placenta delivered spontaneously with gentle cord traction. Fundus firm with massage and Pitocin. Labia, perineum, vagina, and cervix inspected with no lacerations noted.  Baby Weight: pending  Placenta: 3 vessel, intact. Sent to L&D Complications: None Lacerations: none EBL: 63 mL Analgesia: Epidural   Infant:  APGAR (1 MIN):  9 APGAR (5 MINS):  9  Myrtie Hawk, DO OB Family Medicine Fellow, [facility] for [facility], [facility] 12/13/2022, 12:39 PM

## 2022-07-19 LAB — GC/CHLAMYDIA PROBE AMP (~~LOC~~) NOT AT ARMC
Chlamydia: NEGATIVE
Comment: NEGATIVE
Comment: NORMAL
Neisseria Gonorrhea: NEGATIVE

## 2022-07-23 ENCOUNTER — Encounter (HOSPITAL_COMMUNITY): Payer: Self-pay | Admitting: Obstetrics and Gynecology

## 2022-07-23 ENCOUNTER — Inpatient Hospital Stay (HOSPITAL_COMMUNITY)
Admission: AD | Admit: 2022-07-23 | Discharge: 2022-07-24 | Disposition: A | Discharge status: Home / Self Care | Payer: Medicaid Other | Attending: Obstetrics and Gynecology | Admitting: Obstetrics and Gynecology

## 2022-07-23 DIAGNOSIS — O98512 Other viral diseases complicating pregnancy, second trimester: Secondary | ICD-10-CM | POA: Insufficient documentation

## 2022-07-23 DIAGNOSIS — O26892 Other specified pregnancy related conditions, second trimester: Secondary | ICD-10-CM | POA: Insufficient documentation

## 2022-07-23 DIAGNOSIS — J069 Acute upper respiratory infection, unspecified: Secondary | ICD-10-CM

## 2022-07-23 DIAGNOSIS — O99512 Diseases of the respiratory system complicating pregnancy, second trimester: Secondary | ICD-10-CM | POA: Diagnosis not present

## 2022-07-23 DIAGNOSIS — Z3A15 15 weeks gestation of pregnancy: Secondary | ICD-10-CM | POA: Diagnosis not present

## 2022-07-23 DIAGNOSIS — Z1152 Encounter for screening for COVID-19: Secondary | ICD-10-CM | POA: Diagnosis not present

## 2022-07-23 MED ORDER — IBUPROFEN 800 MG PO TABS
800.0000 mg | ORAL_TABLET | Freq: Once | ORAL | Status: AC
  Administered 2022-07-24: 800 mg via ORAL
  Filled 2022-07-23: qty 1

## 2022-07-23 MED ORDER — BENZONATATE 100 MG PO CAPS
200.0000 mg | ORAL_CAPSULE | Freq: Once | ORAL | Status: AC
  Administered 2022-07-24: 200 mg via ORAL
  Filled 2022-07-23: qty 2

## 2022-07-23 NOTE — MAU Provider Note (Addendum)
History     CSN: [id number]  Arrival date and time: 07/23/22 2306   Event Date/Time   First Provider Initiated Contact with Patient 07/23/22 2341      Chief Complaint  Patient presents with   Abdominal Pain   Back Pain   Pelvic Pain   Cough   Nasal Congestion   Generalized Body Aches   Brittany [name] is a 42 y.o. [id number] at [redacted]w[redacted]d who presents today with cough, congestion, body aches and back ache x 2 days. She has had a flu vaccine, no covid vaccine, no sick contacts. She has not checked for fever. She denies any vaginal bleeding or pregnancy complaints today.   Back Pain This is a new problem. The current episode started in the past 7 days. The pain is present in the lumbar spine. The pain is at a severity of 9/10.  Cough This is a new problem. The current episode started in the past 7 days. The problem has been unchanged. The cough is Non-productive. Nothing aggravates the symptoms. She has tried nothing for the symptoms.    OB History     Gravida  10   Para  4   Term  2   Preterm  2   AB  5   Living  4      SAB  4   IAB  1   Ectopic  0   Multiple  0   Live Births  4           Past Medical History:  Diagnosis Date   Gallstone    Headache(784.0)    Heart murmur    Hypertension    Infection    UTI   Maternal chronic hypertension in first trimester 04/09/2011   Pregnancy induced hypertension    after 2nd delivery   Vaginal Pap smear, abnormal    cant remember    Past Surgical History:  Procedure Laterality Date   DILATION AND CURETTAGE OF UTERUS     dont remember year   LEEP      Family History  Problem Relation Age of Onset   Asthma Mother    Hypertension Mother    CAD Mother    Asthma Maternal Grandmother    Hypertension Maternal Grandmother     Social History   Tobacco Use   Smoking status: Never   Smokeless tobacco: Never  Vaping Use   Vaping Use: Never used  Substance Use Topics   Alcohol use: No    Alcohol/week:  0.0 standard drinks of alcohol   Drug use: No    Allergies: No Known Allergies  Medications Prior to Admission  Medication Sig Dispense Refill Last Dose   acetaminophen (TYLENOL) 500 MG tablet Take 500 mg by mouth every 6 (six) hours as needed.   07/23/2022 at 2100   aspirin EC 81 MG tablet Take 1 tablet (81 mg total) by mouth daily. Take after 12 weeks for prevention of preeclampsia later in pregnancy 300 tablet 2 07/23/2022   NIFEdipine (PROCARDIA XL) 30 MG 24 hr tablet Take 1 tablet (30 mg total) by mouth daily. 30 tablet 5 07/23/2022   prenatal vitamin w/FE, FA (PRENATAL 1 + 1) 27-1 MG TABS tablet Take 1 tablet by mouth daily at 12 noon. 30 tablet 11 07/23/2022   scopolamine (TRANSDERM-SCOP) 1 MG/3DAYS Place 1 patch (1.5 mg total) onto the skin every 3 (three) days. (Patient not taking: Reported on 07/04/2022) 10 patch 12    cyclobenzaprine (FLEXERIL) 10 MG  tablet Take 1 tablet (10 mg total) by mouth 2 (two) times daily as needed for up to 10 doses for muscle spasms. 10 tablet 0    metoCLOPramide (REGLAN) 10 MG tablet Take 1 tablet (10 mg total) by mouth every 6 (six) hours as needed for nausea or vomiting. 30 tablet 0    promethazine (PHENERGAN) 25 MG tablet Take 0.5-1 tablets (12.5-25 mg total) by mouth every 6 (six) hours as needed for nausea or vomiting. 30 tablet 2     Review of Systems  Respiratory:  Positive for cough.   All other systems reviewed and are negative.  Physical Exam   Blood pressure 109/69, pulse 71, temperature 99.2 F (37.3 C), temperature source Oral, resp. rate 16, height 5\' 5"  (1.651 m), weight 78.7 kg, last menstrual period 04/07/2022, SpO2 99 %, currently breastfeeding.  Physical Exam Constitutional:      Appearance: She is well-developed.  HENT:     Head: Normocephalic.  Eyes:     Pupils: Pupils are equal, round, and reactive to light.  Cardiovascular:     Rate and Rhythm: Normal rate.  Pulmonary:     Effort: Pulmonary effort is normal. No respiratory  distress.  Abdominal:     Palpations: Abdomen is soft.     Tenderness: There is no abdominal tenderness.  Genitourinary:    Vagina: No bleeding. Vaginal discharge: mucusy.    Comments: External: no lesion Vagina: small amount of white discharge     Musculoskeletal:        General: Normal range of motion.     Cervical back: Normal range of motion and neck supple.  Skin:    General: Skin is warm and dry.  Neurological:     Mental Status: She is alert and oriented to person, place, and time.  Psychiatric:        Mood and Affect: Mood normal.        Behavior: Behavior normal.     MAU Course  Procedures  MDM  Covid/Flu/RSV pending  Patient given ibuprofen and tessalon for symptoms while results pending Care turned over to [facility], CNM   Marcille Buffy DNP, CNM  07/23/22  11:51 PM   Results for orders placed or performed during the hospital encounter of 07/23/22 (from the past 24 hour(s))  Resp panel by RT-PCR (RSV, Flu A&B, Covid) Anterior Nasal Swab     Status: None   Collection Time: 07/23/22 11:30 PM   Specimen: Anterior Nasal Swab  Result Value Ref Range   SARS Coronavirus 2 by RT PCR NEGATIVE NEGATIVE   Influenza A by PCR NEGATIVE NEGATIVE   Influenza B by PCR NEGATIVE NEGATIVE   Resp Syncytial Virus by PCR NEGATIVE NEGATIVE    OTC options for symptom treatment reviewed  Assessment and Plan   1. Viral URI with cough   2. [redacted] weeks gestation of pregnancy    -Discharge home in stable condition -Second trimester precautions discussed -Patient advised to follow-up with OB as scheduled for prenatal care -Patient may return to MAU as needed or if her condition were to change or worsen  Wende Mott, CNM 07/24/22 12:44 AM

## 2022-07-23 NOTE — MAU Note (Signed)
.  Brittany [name] is a 42 y.o. at [redacted]w[redacted]d here in MAU reporting: sharp lower back pain that is constant 9/10, constant lower abdominal cramping (9/10), pelvic pressure that is constant and worse when voiding, HA 9/10, generalized aches, cough, runny nose. All of these symptoms began two days ago, but have since gotten worse. Denies contact with anyone who has been sick. Denies VB or abnormal discharge.  Last took Tylenol around 2100 and did not get any relief.  LMP: N/A Onset of complaint: 2 days ago Pain score: 9/10 Vitals:   07/23/22 2322  BP: 112/65  Pulse: 74  Resp: 16  Temp: 99.2 F (37.3 C)  SpO2: 99%     FHT:159 Lab orders placed from triage:  UA

## 2022-07-24 ENCOUNTER — Telehealth: Payer: Medicaid Other | Admitting: Family Medicine

## 2022-07-24 DIAGNOSIS — J069 Acute upper respiratory infection, unspecified: Secondary | ICD-10-CM

## 2022-07-24 DIAGNOSIS — Z3490 Encounter for supervision of normal pregnancy, unspecified, unspecified trimester: Secondary | ICD-10-CM

## 2022-07-24 DIAGNOSIS — Z3A15 15 weeks gestation of pregnancy: Secondary | ICD-10-CM

## 2022-07-24 LAB — RESP PANEL BY RT-PCR (RSV, FLU A&B, COVID)  RVPGX2
Influenza A by PCR: NEGATIVE
Influenza B by PCR: NEGATIVE
Resp Syncytial Virus by PCR: NEGATIVE
SARS Coronavirus 2 by RT PCR: NEGATIVE

## 2022-07-24 MED ORDER — BENZONATATE 100 MG PO CAPS: 100.0000 mg | ORAL_CAPSULE | Freq: Three times a day (TID) | ORAL | 0 refills | Status: DC

## 2022-07-24 NOTE — Progress Notes (Signed)
For the safety of you and your child, I recommend a face to face office visit with a health care provider.  Many mothers need to take medicines during their pregnancy and while nursing.  Almost all medicines pass into the breast milk in small quantities.  Most are generally considered safe for a mother to take but some medicines must be avoided.  After reviewing your E-Visit request, I recommend that you consult your OB/GYN or pediatrician for medical advice in relation to your condition and prescription medications while pregnant or breastfeeding.  NOTE:  There will be NO CHARGE for this eVisit  If you are having a true medical emergency please call 911.    For an urgent face to face visit, [place] has six urgent care centers for your convenience:     [facility] [facility] [place] Get Driving Directions [id number] [address] [place], [place] [address]    [facility]) Get Driving Directions [id number] [place], [place] [address]  [facility] [place] ([place]) Get Driving Directions [id number] [address] [place],  [place]  [address]  [facility] Get Driving Directions [id number] [address] [place], [place], [place] [address]   [facility] Get Driving Directions  [id number] [address] [place], [place] [address]   [facility] [place] Get Driving Directions [id number] [address], [place], [place] [address]  Your MyChart E-visit questionnaire answers were reviewed by a board certified advanced clinical practitioner to complete your personal care plan based on your specific symptoms.  Thank you for using e-Visits.   have provided 5 minutes of non face to face time during this encounter for chart review and documentation.

## 2022-07-24 NOTE — Discharge Instructions (Signed)

## 2022-07-31 ENCOUNTER — Inpatient Hospital Stay (HOSPITAL_COMMUNITY)
Admission: AD | Admit: 2022-07-31 | Discharge: 2022-07-31 | Disposition: A | Discharge status: Home / Self Care | Payer: Medicaid Other | Attending: Obstetrics and Gynecology | Admitting: Obstetrics and Gynecology

## 2022-07-31 ENCOUNTER — Encounter (HOSPITAL_COMMUNITY): Payer: Self-pay | Admitting: Obstetrics and Gynecology

## 2022-07-31 DIAGNOSIS — R102 Pelvic and perineal pain: Secondary | ICD-10-CM | POA: Insufficient documentation

## 2022-07-31 DIAGNOSIS — M545 Low back pain, unspecified: Secondary | ICD-10-CM | POA: Insufficient documentation

## 2022-07-31 DIAGNOSIS — O99891 Other specified diseases and conditions complicating pregnancy: Secondary | ICD-10-CM

## 2022-07-31 DIAGNOSIS — O26892 Other specified pregnancy related conditions, second trimester: Secondary | ICD-10-CM | POA: Insufficient documentation

## 2022-07-31 DIAGNOSIS — R3915 Urgency of urination: Secondary | ICD-10-CM | POA: Diagnosis not present

## 2022-07-31 DIAGNOSIS — Z3A16 16 weeks gestation of pregnancy: Secondary | ICD-10-CM

## 2022-07-31 DIAGNOSIS — Z3492 Encounter for supervision of normal pregnancy, unspecified, second trimester: Secondary | ICD-10-CM

## 2022-07-31 DIAGNOSIS — M549 Dorsalgia, unspecified: Secondary | ICD-10-CM

## 2022-07-31 DIAGNOSIS — Z87440 Personal history of urinary (tract) infections: Secondary | ICD-10-CM | POA: Diagnosis not present

## 2022-07-31 LAB — URINALYSIS, ROUTINE W REFLEX MICROSCOPIC
Bilirubin Urine: NEGATIVE
Glucose, UA: NEGATIVE mg/dL
Hgb urine dipstick: NEGATIVE
Ketones, ur: NEGATIVE mg/dL
Leukocytes,Ua: NEGATIVE
Nitrite: NEGATIVE
Protein, ur: NEGATIVE mg/dL
Specific Gravity, Urine: 1.02 (ref 1.005–1.030)
pH: 6 (ref 5.0–8.0)

## 2022-07-31 LAB — WET PREP, GENITAL
Clue Cells Wet Prep HPF POC: NONE SEEN
Sperm: NONE SEEN
Trich, Wet Prep: NONE SEEN
WBC, Wet Prep HPF POC: <10 (ref <10)
Yeast Wet Prep HPF POC: NONE SEEN

## 2022-07-31 MED ORDER — IBUPROFEN 600 MG PO TABS
600.0000 mg | ORAL_TABLET | Freq: Once | ORAL | Status: AC
  Administered 2022-07-31: 600 mg via ORAL
  Filled 2022-07-31: qty 1

## 2022-07-31 NOTE — Discharge Instructions (Signed)

## 2022-07-31 NOTE — MAU Provider Note (Addendum)
Chief Complaint:  Abdominal Pain and Back Pain   Event Date/Time   First Provider Initiated Contact with Patient 07/31/22 0412     HPI: Brittany [name] is a 42 y.o. [id number] at [redacted]w[redacted]d who presents to maternity admissions reporting sharp, constant low back and abdominal pain plus vaginal pressure when voiding. Can completely empty her bladder but is having urinary urgency/frequency. Was asleep and the pain woke her up so she came in. Reports having UTIs before but says this does not feel like a UTI. Denies vaginal bleeding, has vaginal discharge but no odor.  Pregnancy Course: Receives care at [facility], has been to MAU 11x since pregnancy started. Half have involved abdominal pain - twice she has had viral illnesses, once with BV.  Past Medical History:  Diagnosis Date   Gallstone    Headache(784.0)    Heart murmur    Hypertension    Infection    UTI   Maternal chronic hypertension in first trimester 04/09/2011   Pregnancy induced hypertension    after 2nd delivery   Vaginal Pap smear, abnormal    cant remember   OB History  Gravida Para Term Preterm AB Living  10 4 2 2 5 4   SAB IAB Ectopic Multiple Live Births  4 1 0 0 4    # Outcome Date GA Lbr Len/2nd Weight Sex Delivery Anes PTL Lv  10 Current           9 Preterm 06/13/18 [redacted]w[redacted]d 07:04 / 00:15 5 lb 1.1 oz (2.3 kg) F Vag-Spont EPI  LIV  8 Term 05/03/16 [redacted]w[redacted]d 00:08 / 00:01 4 lb 12.7 oz (2.175 kg) M Vag-Spont None  LIV  7 Preterm 04/06/11 [redacted]w[redacted]d 08:25 / 00:08 5 lb 6.6 oz (2.455 kg) F Vag-Spont None  LIV     Complications: PIH (pregnancy induced hypertension)  6 Term 10/21/05 [redacted]w[redacted]d    Vag-Spont   LIV     Complications: Failure to Progress in Second Stage  5 IAB           4 SAB           3 SAB           2 SAB           1 SAB            Past Surgical History:  Procedure Laterality Date   DILATION AND CURETTAGE OF UTERUS     dont remember year   LEEP     Family History  Problem Relation Age of Onset   Asthma Mother     Hypertension Mother    CAD Mother    Asthma Maternal Grandmother    Hypertension Maternal Grandmother    Social History   Tobacco Use   Smoking status: Never   Smokeless tobacco: Never  Vaping Use   Vaping Use: Never used  Substance Use Topics   Alcohol use: No    Alcohol/week: 0.0 standard drinks of alcohol   Drug use: No   No Known Allergies Medications Prior to Admission  Medication Sig Dispense Refill Last Dose   aspirin EC 81 MG tablet Take 1 tablet (81 mg total) by mouth daily. Take after 12 weeks for prevention of preeclampsia later in pregnancy 300 tablet 2 07/30/2022   NIFEdipine (PROCARDIA XL) 30 MG 24 hr tablet Take 1 tablet (30 mg total) by mouth daily. 30 tablet 5 07/30/2022   prenatal vitamin w/FE, FA (PRENATAL 1 + 1) 27-1 MG TABS tablet Take 1  tablet by mouth daily at 12 noon. 30 tablet 11 07/30/2022   scopolamine (TRANSDERM-SCOP) 1 MG/3DAYS Place 1 patch (1.5 mg total) onto the skin every 3 (three) days. (Patient not taking: Reported on 07/04/2022) 10 patch 12    acetaminophen (TYLENOL) 500 MG tablet Take 500 mg by mouth every 6 (six) hours as needed.      benzonatate (TESSALON) 100 MG capsule Take 1 capsule (100 mg total) by mouth every 8 (eight) hours. 30 capsule 0    cyclobenzaprine (FLEXERIL) 10 MG tablet Take 1 tablet (10 mg total) by mouth 2 (two) times daily as needed for up to 10 doses for muscle spasms. 10 tablet 0    metoCLOPramide (REGLAN) 10 MG tablet Take 1 tablet (10 mg total) by mouth every 6 (six) hours as needed for nausea or vomiting. 30 tablet 0    promethazine (PHENERGAN) 25 MG tablet Take 0.5-1 tablets (12.5-25 mg total) by mouth every 6 (six) hours as needed for nausea or vomiting. 30 tablet 2    I have reviewed patient's Past Medical Hx, Surgical Hx, Family Hx, Social Hx, medications and allergies.   ROS:  Pertinent items noted in HPI and remainder of comprehensive ROS otherwise negative.   Physical Exam  Patient Vitals for the past 24 hrs:  BP  Temp Temp src Pulse Resp SpO2 Height Weight  07/31/22 0317 127/81 98.8 F (37.1 C) Oral 73 14 99 % 5\' 5"  (1.651 m) 174 lb 9.6 oz (79.2 kg)   Constitutional: Well-developed, well-nourished female in no acute distress (asleep in the chair in MAU lobby then had to be roused to give pain meds).  Cardiovascular: normal rate & rhythm, warm and well-perfused Respiratory: normal effort, no problems with respiration noted GI: Abd soft, non-tender, gravid appropriate for gestational age MS: Extremities nontender, no edema, normal ROM Neurologic: Alert and oriented x 4.  GU: no CVA tenderness Pelvic: Self-swabs obtained. Speculum and bimanual exam performed - normal external female genitalia with no lesions or skin breakdown. Normal pink ruggae without bulging or signs of internal organ prolapse. No blood, physiologic discharge.  FHR: 151   Labs: Results for orders placed or performed during the hospital encounter of 07/31/22 (from the past 24 hour(s))  Urinalysis, Routine w reflex microscopic Urine, Clean Catch     Status: Abnormal   Collection Time: 07/31/22  4:08 AM  Result Value Ref Range   Color, Urine YELLOW YELLOW   APPearance HAZY (A) CLEAR   Specific Gravity, Urine 1.020 1.005 - 1.030   pH 6.0 5.0 - 8.0   Glucose, UA NEGATIVE NEGATIVE mg/dL   Hgb urine dipstick NEGATIVE NEGATIVE   Bilirubin Urine NEGATIVE NEGATIVE   Ketones, ur NEGATIVE NEGATIVE mg/dL   Protein, ur NEGATIVE NEGATIVE mg/dL   Nitrite NEGATIVE NEGATIVE   Leukocytes,Ua NEGATIVE NEGATIVE  Wet prep, genital     Status: None   Collection Time: 07/31/22  4:50 AM   Specimen: Vaginal  Result Value Ref Range   Yeast Wet Prep HPF POC NONE SEEN NONE SEEN   Trich, Wet Prep NONE SEEN NONE SEEN   Clue Cells Wet Prep HPF POC NONE SEEN NONE SEEN   WBC, Wet Prep HPF POC <10 <10   Sperm NONE SEEN    Imaging:  No results found.  MAU Course: Orders Placed This Encounter  Procedures   Wet prep, genital   Urinalysis, Routine w  reflex microscopic Urine, Clean Catch   Encourage fluids   Discharge patient   Meds ordered  this encounter  Medications   ibuprofen (ADVIL) tablet 600 mg   MDM: Low  UA and wet prep normal, ibuprofen brought pain down slowly. Sleeping each time staff entered the room. Explained that since no signs of infection and FHT strong on Doppler, pain likely musculoskeletal. Encouraged a warm bath, alternating ice and heat on her back, daily stretching and rest. Pt verbalized understanding.  Assessment: 1. Back pain affecting pregnancy in second trimester   2. Pelvic pain affecting pregnancy in second trimester, antepartum   3. [redacted] weeks gestation of pregnancy   4. Fetal heart tones present, second trimester    Plan: Discharge home in stable condition with return precautions     [place] [facility] [place] [facility] Follow up.   Specialty: Obstetrics and Gynecology Why: as scheduled for ongoing prenatal care Contact information: [address] [place] [phone]                Allergies as of 07/31/2022   No Known Allergies      Medication List     STOP taking these medications    scopolamine 1 MG/3DAYS Commonly known as: TRANSDERM-SCOP       TAKE these medications    acetaminophen 500 MG tablet Commonly known as: TYLENOL Take 500 mg by mouth every 6 (six) hours as needed.   aspirin EC 81 MG tablet Take 1 tablet (81 mg total) by mouth daily. Take after 12 weeks for prevention of preeclampsia later in pregnancy   benzonatate 100 MG capsule Commonly known as: TESSALON Take 1 capsule (100 mg total) by mouth every 8 (eight) hours.   cyclobenzaprine 10 MG tablet Commonly known as: FLEXERIL Take 1 tablet (10 mg total) by mouth 2 (two) times daily as needed for up to 10 doses for muscle spasms.   metoCLOPramide 10 MG tablet Commonly known as: REGLAN Take 1 tablet (10 mg total) by mouth every 6  (six) hours as needed for nausea or vomiting.   NIFEdipine 30 MG 24 hr tablet Commonly known as: Procardia XL Take 1 tablet (30 mg total) by mouth daily.   prenatal vitamin w/FE, FA 27-1 MG Tabs tablet Take 1 tablet by mouth daily at 12 noon.   promethazine 25 MG tablet Commonly known as: PHENERGAN Take 0.5-1 tablets (12.5-25 mg total) by mouth every 6 (six) hours as needed for nausea or vomiting.       Gaylan Gerold, CNM, MSN, [place] Certified Nurse Midwife, [facility]

## 2022-07-31 NOTE — MAU Note (Signed)
..  Brittany [name] is a 42 y.o. at [redacted]w[redacted]d here in MAU reporting: Sharp constant lower back pain, sharp constant lower abdominal pain, and vaginal pressure when she tries to void. This began yesterday morning. Reports that the pain makes it difficult for her to lay down and rest.  Pt also reports urinary urgency and frequency.  Denies vaginal bleeding  Pain score: 9/10 Vitals:   07/31/22 0317  BP: 127/81  Pulse: 73  Resp: 14  Temp: 98.8 F (37.1 C)  SpO2: 99%     FHT:151 Lab orders placed from triage:  UA

## 2022-08-01 ENCOUNTER — Other Ambulatory Visit: Payer: Self-pay | Admitting: Family

## 2022-08-01 ENCOUNTER — Ambulatory Visit (INDEPENDENT_AMBULATORY_CARE_PROVIDER_SITE_OTHER): Payer: Medicaid Other | Admitting: Obstetrics and Gynecology

## 2022-08-01 ENCOUNTER — Encounter: Payer: Self-pay | Admitting: Family

## 2022-08-01 VITALS — BP 137/88 | HR 91 | Wt 173.0 lb

## 2022-08-01 DIAGNOSIS — O09522 Supervision of elderly multigravida, second trimester: Secondary | ICD-10-CM

## 2022-08-01 DIAGNOSIS — O0990 Supervision of high risk pregnancy, unspecified, unspecified trimester: Secondary | ICD-10-CM

## 2022-08-01 DIAGNOSIS — O99612 Diseases of the digestive system complicating pregnancy, second trimester: Secondary | ICD-10-CM

## 2022-08-01 DIAGNOSIS — K5900 Constipation, unspecified: Secondary | ICD-10-CM

## 2022-08-01 DIAGNOSIS — O09299 Supervision of pregnancy with other poor reproductive or obstetric history, unspecified trimester: Secondary | ICD-10-CM | POA: Insufficient documentation

## 2022-08-01 DIAGNOSIS — Z8679 Personal history of other diseases of the circulatory system: Secondary | ICD-10-CM | POA: Insufficient documentation

## 2022-08-01 DIAGNOSIS — Z3A16 16 weeks gestation of pregnancy: Secondary | ICD-10-CM

## 2022-08-01 HISTORY — DX: Supervision of pregnancy with other poor reproductive or obstetric history, unspecified trimester: O09.299

## 2022-08-01 LAB — GC/CHLAMYDIA PROBE AMP (~~LOC~~) NOT AT ARMC
Chlamydia: NEGATIVE
Comment: NEGATIVE
Comment: NORMAL
Neisseria Gonorrhea: NEGATIVE

## 2022-08-01 NOTE — Progress Notes (Unsigned)
ROB   Pt notes not taking B/P Rx today.   MAU visit yesterday for back pain.  Also notes lower sharp pelvic pain

## 2022-08-02 ENCOUNTER — Encounter: Payer: Medicaid Other | Admitting: Obstetrics and Gynecology

## 2022-08-02 DIAGNOSIS — O09529 Supervision of elderly multigravida, unspecified trimester: Secondary | ICD-10-CM | POA: Insufficient documentation

## 2022-08-02 DIAGNOSIS — O09522 Supervision of elderly multigravida, second trimester: Secondary | ICD-10-CM | POA: Insufficient documentation

## 2022-08-02 NOTE — Progress Notes (Signed)
   PRENATAL VISIT NOTE  Subjective:  Brittany [name] is a 42 y.o. [id number] at [redacted]w[redacted]d being seen today for ongoing prenatal care.  She is currently monitored for the following issues for this high-risk pregnancy and has Chronic hypertension with superimposed preeclampsia; Subclinical hyperthyroidism; Migraine; Murmur, cardiac; Anemia; Vitamin D deficiency; Depression affecting pregnancy; Supervision of high risk pregnancy, antepartum; and History of chronic hypertension on their problem list.  Patient reports  low back and pelvic discomfort stable; pt seen in MAU yesterday and negative exam, labs .  Contractions: Not present. Vag. Bleeding: None.  Movement: Present. Denies leaking of fluid.   The following portions of the patient's history were reviewed and updated as appropriate: allergies, current medications, past family history, past medical history, past social history, past surgical history and problem list.   Objective:   Vitals:   08/01/22 1534 08/01/22 1624  BP: (!) 140/84 137/88  Pulse: 89 91  Weight: 173 lb (78.5 kg)     Fetal Status: Fetal Heart Rate (bpm): 152   Movement: Present     General:  Alert, oriented and cooperative. Patient is in no acute distress.  Skin: Skin is warm and dry. No rash noted.   Cardiovascular: Normal heart rate noted  Respiratory: Normal respiratory effort, no problems with respiration noted  Abdomen: Soft, gravid, appropriate for gestational age.  Pain/Pressure: Present     Pelvic: Cervical exam performed in the presence of a chaperone       Spec exam negative Cervix: cl/long//high  Extremities: Normal range of motion.  Edema: None  Mental Status: Normal mood and affect. Normal behavior. Normal judgment and thought content.   Assessment and Plan:  Pregnancy: [id number] at [redacted]w[redacted]d 1. Supervision of high risk pregnancy, antepartum F/u anatomy u/s Encouraged her to call office with any issues to see her in the office and try to avoid mau visits.  -  AFP, Serum, Open Spina Bifida  2. History of chronic hypertension Didn't take meds today (procardia 30 qday) Continue low dose asa  3. [redacted] weeks gestation of pregnancy  4. AMA (advanced maternal age) multigravida 54+, second trimester Low risk panorama - AFP, Serum, Open Spina Bifida  5. Constipation in pregnancy in second trimester Noted on exam today. Qday fiber or miralax encouraged as could be cause of her s/s.   Preterm labor symptoms and general obstetric precautions including but not limited to vaginal bleeding, contractions, leaking of fluid and fetal movement were reviewed in detail with the patient. Please refer to After Visit Summary for other counseling recommendations.   No follow-ups on file.  Future Appointments  Date Time Provider [place]  08/22/2022  2:15 PM [place] NURSE [facility]  08/22/2022  2:30 PM [facility] US2 [facility] [place]  08/29/2022  3:30 PM Anyanwu, Sallyanne Havers, MD [facility]  09/26/2022  3:30 PM Aletha Halim, MD [facility]    Aletha Halim, MD

## 2022-08-03 LAB — AFP, SERUM, OPEN SPINA BIFIDA
AFP MoM: 0.82
AFP Value: 27.9 ng/mL
Gest. Age on Collection Date: 16 weeks
Maternal Age At EDD: 39.8 yr
OSBR Risk 1 IN: 10000
Test Results:: NEGATIVE
Weight: 173 [lb_av]

## 2022-08-05 ENCOUNTER — Encounter: Payer: Self-pay | Admitting: Obstetrics and Gynecology

## 2022-08-14 ENCOUNTER — Encounter: Payer: Self-pay | Admitting: Obstetrics and Gynecology

## 2022-08-22 ENCOUNTER — Other Ambulatory Visit: Payer: Self-pay | Admitting: *Deleted

## 2022-08-22 ENCOUNTER — Ambulatory Visit: Payer: Medicaid Other | Admitting: *Deleted

## 2022-08-22 ENCOUNTER — Encounter: Payer: Self-pay | Admitting: *Deleted

## 2022-08-22 ENCOUNTER — Ambulatory Visit: Payer: Medicaid Other | Attending: Family Medicine

## 2022-08-22 VITALS — BP 131/72 | HR 75

## 2022-08-22 DIAGNOSIS — O10012 Pre-existing essential hypertension complicating pregnancy, second trimester: Secondary | ICD-10-CM | POA: Diagnosis not present

## 2022-08-22 DIAGNOSIS — Z3A19 19 weeks gestation of pregnancy: Secondary | ICD-10-CM

## 2022-08-22 DIAGNOSIS — O09292 Supervision of pregnancy with other poor reproductive or obstetric history, second trimester: Secondary | ICD-10-CM

## 2022-08-22 DIAGNOSIS — O09522 Supervision of elderly multigravida, second trimester: Secondary | ICD-10-CM

## 2022-08-22 DIAGNOSIS — O0990 Supervision of high risk pregnancy, unspecified, unspecified trimester: Secondary | ICD-10-CM | POA: Insufficient documentation

## 2022-08-22 DIAGNOSIS — E079 Disorder of thyroid, unspecified: Secondary | ICD-10-CM

## 2022-08-22 DIAGNOSIS — O99282 Endocrine, nutritional and metabolic diseases complicating pregnancy, second trimester: Secondary | ICD-10-CM

## 2022-08-22 DIAGNOSIS — O10919 Unspecified pre-existing hypertension complicating pregnancy, unspecified trimester: Secondary | ICD-10-CM

## 2022-08-29 ENCOUNTER — Ambulatory Visit (INDEPENDENT_AMBULATORY_CARE_PROVIDER_SITE_OTHER): Payer: Medicaid Other | Admitting: Obstetrics & Gynecology

## 2022-08-29 ENCOUNTER — Encounter: Payer: Self-pay | Admitting: Obstetrics & Gynecology

## 2022-08-29 VITALS — BP 132/80 | HR 72 | Wt 175.0 lb

## 2022-08-29 DIAGNOSIS — Z3A20 20 weeks gestation of pregnancy: Secondary | ICD-10-CM

## 2022-08-29 DIAGNOSIS — O09299 Supervision of pregnancy with other poor reproductive or obstetric history, unspecified trimester: Secondary | ICD-10-CM

## 2022-08-29 DIAGNOSIS — O10912 Unspecified pre-existing hypertension complicating pregnancy, second trimester: Secondary | ICD-10-CM

## 2022-08-29 DIAGNOSIS — O09522 Supervision of elderly multigravida, second trimester: Secondary | ICD-10-CM

## 2022-08-29 DIAGNOSIS — O0990 Supervision of high risk pregnancy, unspecified, unspecified trimester: Secondary | ICD-10-CM

## 2022-08-29 DIAGNOSIS — O10919 Unspecified pre-existing hypertension complicating pregnancy, unspecified trimester: Secondary | ICD-10-CM

## 2022-08-29 DIAGNOSIS — O09292 Supervision of pregnancy with other poor reproductive or obstetric history, second trimester: Secondary | ICD-10-CM

## 2022-08-29 DIAGNOSIS — O0992 Supervision of high risk pregnancy, unspecified, second trimester: Secondary | ICD-10-CM

## 2022-08-29 NOTE — Progress Notes (Signed)
PRENATAL VISIT NOTE  Subjective:  Brittany [name] is a 42 y.o. [id number] at 84w4dbeing seen today for ongoing prenatal care.  She is currently monitored for the following issues for this high-risk pregnancy and has Chronic hypertension affecting pregnancy; Subclinical hyperthyroidism; Migraine; Murmur, cardiac; Anemia; Vitamin D deficiency; Depression affecting pregnancy; Supervision of high risk pregnancy, antepartum; Hx of preeclampsia, prior pregnancy, currently pregnant; and AMA (advanced maternal age) multigravida 3109+ second trimester on their problem list.  Patient reports backache.  Contractions: Irritability. Vag. Bleeding: None.  Movement: Present. Denies leaking of fluid.   The following portions of the patient's history were reviewed and updated as appropriate: allergies, current medications, past family history, past medical history, past social history, past surgical history and problem list.   Objective:   Vitals:   08/29/22 1556  BP: 132/80  Pulse: 72  Weight: 175 lb (79.4 kg)    Fetal Status: Fetal Heart Rate (bpm): 164   Movement: Present     General:  Alert, oriented and cooperative. Patient is in no acute distress.  Skin: Skin is warm and dry. No rash noted.   Cardiovascular: Normal heart rate noted  Respiratory: Normal respiratory effort, no problems with respiration noted  Abdomen: Soft, gravid, appropriate for gestational age.  Pain/Pressure: Present     Pelvic: Cervical exam deferred        Extremities: Normal range of motion.     Mental Status: Normal mood and affect. Normal behavior. Normal judgment and thought content.   [place]FM OB DETAIL +14 WK  Result Date: 08/22/2022 ----------------------------------------------------------------------  OBSTETRICS REPORT                       (Signed Final 08/22/2022 03:35 pm) ---------------------------------------------------------------------- Patient Info  ID #:       [id number]                         D.O.B.:   [id number](41 yrs)  Name:       Brittany [name]                Visit Date: 08/22/2022 02:22 pm ---------------------------------------------------------------------- Performed By  Attending:        BValeda MalmDO       Ref. Address:     [address] Performed By:     MJacob MooresBS,       [facility]                    RDMS, RVT                                [facility]  Referred By:      [facility] ---------------------------------------------------------------------- Orders  #  Description                           Code        Ordered By  1  [place] MFM OB DETAIL +14 WK               76811.01    [facility] [place] ----------------------------------------------------------------------  #  Order #                     Accession #                Episode #  1  [id number]                   [id number]                 [id number] ---------------------------------------------------------------------- Indications  [redacted] weeks gestation of pregnancy                Z3A.19  Hypertension - Chronic/Pre-existing            O10.019  Poor obstetric history: Previous               O09.299  preeclampsia / eclampsia/gestational HTN  Advanced maternal age multigravida 93+,        O21.522  second trimester  Thyroid disease in pregnancy                   O99.280, E07.9  LR NIPS/ Neg AFP  Encounter for antenatal screening for          Z36.3  malformations ---------------------------------------------------------------------- Fetal Evaluation  Num Of Fetuses:         1  Fetal Heart Rate(bpm):  149  Cardiac Activity:       Observed  Presentation:           Variable  Placenta:               Anterior  P. Cord Insertion:      Visualized  Amniotic Fluid  AFI FV:      Within normal limits                               Largest Pocket(cm)                              4.64 ---------------------------------------------------------------------- Biometry  BPD:      43.3  mm     G. Age:  19w 1d         30  %    CI:        71.17   %    70 - 86  FL/HC:      18.2   %    16.8 - 19.8  HC:      163.5  mm     G. Age:  19w 1d         21  %    HC/AC:      1.08        1.09 - 1.39  AC:      151.3  mm     G. Age:  20w 2d         71  %    FL/BPD:     68.6   %  FL:       29.7  mm     G. Age:  19w 1d         28  %    FL/AC:      19.6   %    20 - 24  HUM:      29.4  mm     G. Age:  19w 4d         53  %  CER:      19.9  mm     G. Age:  19w 2d         47  %  NFT:       4.4  mm  LV:        6.6  mm  CM:        4.5  mm  Est. FW:     308  gm    0 lb 11 oz      53  % ---------------------------------------------------------------------- OB History  Blood Type:   O+  Gravidity:    4         Term:   2        Prem:   2        SAB:   4  TOP:          1       Ectopic:  0        Living: 4 ---------------------------------------------------------------------- Gestational Age  LMP:           19w 4d        Date:  04/07/22                  EDD:   01/12/23  U/S Today:     19w 3d                                        EDD:   01/13/23  Best:          19w 4d     Det. By:  LMP  (04/07/22)          EDD:   01/12/23 ---------------------------------------------------------------------- Anatomy  Cranium:               Appears normal         LVOT:                   Appears normal  Cavum:                 Appears normal         Aortic Arch:            Appears normal  Ventricles:            Appears normal  Ductal Arch:            Appears normal  Choroid Plexus:        Appears normal         Diaphragm:              Appears normal  Cerebellum:            Appears normal         Stomach:                Appears normal, left                                                                        sided  Posterior Fossa:        Appears normal         Abdomen:                Appears normal  Nuchal Fold:           Appears normal         Abdominal Wall:         Appears nml (cord                                                                        insert, abd wall)  Face:                  Appears normal         Cord Vessels:           Appears normal (3                         (orbits and profile)                           vessel cord)  Lips:                  Appears normal         Kidneys:                Appear normal  Palate:                Appears normal         Bladder:                Appears normal  Thoracic:              Appears normal         Spine:                  Appears normal  Heart:                 Appears normal         Upper Extremities:      Appears normal                         (  4CH, axis, and                         situs)  RVOT:                  Appears normal         Lower Extremities:      Appears normal  Other:  Fetus appears to be female. VC, 3VV and 3VTV visualized.          Heels/feet and open hands/5th digits visualized. Nasal bone, lenses,          maxilla, mandible and falx visualized ---------------------------------------------------------------------- Cervix Uterus Adnexa  Cervix  Length:            3.2  cm.  Normal appearance by transabdominal scan  Uterus  No abnormality visualized.  Right Ovary  Within normal limits.  Left Ovary  Within normal limits.  Cul De Sac  No free fluid seen.  Adnexa  No abnormality visualized ---------------------------------------------------------------------- Comments  Ms. Zahradnik is a [id number] at 19w 4d here for a detailed  anatomic survey. She has no further concerns today. BP is  normal at 131/72.  EDD: 01/12/2023.  Dating: LMP  (04/07/22).  Other pregnancy complications: Advanced maternal age,  chronic pretension (Procardia), history of preeclampsia.  Aneuplolidy screening: low risk  AFP: negative  Sonographic findings  Single intrauterine pregnancy.  Fetal cardiac activity:   Observed and appears normal.  Presentation: Variable.  The anatomic structures that were well seen appear normal  without evidence of soft markers. The fetal anatomic survey  is completed.  Fetal biometry shows the estimated fetal weight at the 53  percentile.  Amniotic fluid volume: Within normal limits MVP: 4.64 cm.  Placenta: Anterior.  Recommendations  - Follow up ultrasound in 5 weeks to reassess the fetal growth  - Low dose Aspirin for preE ppx ----------------------------------------------------------------------                 Valeda Malm, DO Electronically Signed Final Report   08/22/2022 03:35 pm ----------------------------------------------------------------------   Assessment and Plan:  Pregnancy: [id number] at 69w4d1. AMA (advanced maternal age) multigravida 39+ second trimester LR NIPS, normal anatomy.  2. Chronic hypertension affecting pregnancy 3. Hx of preeclampsia, prior pregnancy, currently pregnant Stable BP, continue ASA and scans as per MFM  4. [redacted] weeks gestation of pregnancy 5. Supervision of high risk pregnancy, antepartum Patient was told to take Tylenol and Flexeril as needed for back pain, recommended referral to PT which she declined for now.  Preterm labor symptoms and general obstetric precautions including but not limited to vaginal bleeding, contractions, leaking of fluid and fetal movement were reviewed in detail with the patient.  Please refer to After Visit Summary for other counseling recommendations.   Return in about 4 weeks (around 09/26/2022) for OFFICE OB VISIT (MD only).  Future Appointments  Date Time Provider [place] 09/26/2022  7:45 AM [facility] 09/26/2022  8:00 AM [facility] 09/26/2022  3:30 PM PAletha Halim MD [facility]  10/24/2022  9:00 AM [facility] LAB [facility]  10/24/2022  2:30 PM Deriana Vanderhoef, USallyanne Havers MD [facility]  11/07/2022  2:30 PM PDonnamae Jude MD [facility]  [facility]    UVerita Schneiders MD

## 2022-08-30 ENCOUNTER — Encounter: Payer: Medicaid Other | Admitting: Obstetrics and Gynecology

## 2022-09-01 ENCOUNTER — Telehealth: Payer: Medicaid Other | Admitting: Physician Assistant

## 2022-09-01 DIAGNOSIS — Z3A21 21 weeks gestation of pregnancy: Secondary | ICD-10-CM | POA: Diagnosis not present

## 2022-09-01 DIAGNOSIS — M545 Low back pain, unspecified: Secondary | ICD-10-CM

## 2022-09-01 NOTE — Progress Notes (Signed)
Virtual Visit Consent   Brittany [name], you are scheduled for a virtual visit with a [facility] provider today. Just as with appointments in the office, your consent must be obtained to participate. Your consent will be active for this visit and any virtual visit you may have with one of our providers in the next 365 days. If you have a MyChart account, a copy of this consent can be sent to you electronically.  As this is a virtual visit, video technology does not allow for your provider to perform a traditional examination. This may limit your provider's ability to fully assess your condition. If your provider identifies any concerns that need to be evaluated in person or the need to arrange testing (such as labs, EKG, etc.), we will make arrangements to do so. Although advances in technology are sophisticated, we cannot ensure that it will always work on either your end or our end. If the connection with a video visit is poor, the visit may have to be switched to a telephone visit. With either a video or telephone visit, we are not always able to ensure that we have a secure connection.  By engaging in this virtual visit, you consent to the provision of healthcare and authorize for your insurance to be billed (if applicable) for the services provided during this visit. Depending on your insurance coverage, you may receive a charge related to this service.  I need to obtain your verbal consent now. Are you willing to proceed with your visit today? Brittany [name] has provided verbal consent on 09/01/2022 for a virtual visit (video or telephone). Inda Coke, [place]  Date: 09/01/2022 7:52 PM  Virtual Visit via Video Note   I, Inda Coke, connected with  Brittany [name]  ([id number], 10-Jul-1981) on 09/01/22 at  7:45 PM EST by a video-enabled telemedicine application and verified that I am speaking with the correct person using two identifiers.  Location: Patient: Virtual Visit Location  Patient: Home Provider: Virtual Visit Location Provider: Home Office   I discussed the limitations of evaluation and management by telemedicine and the availability of in person appointments. The patient expressed understanding and agreed to proceed.    History of Present Illness: Brittany [name] is a 42 y.o. who identifies as a female who was assigned female at birth, and is being seen today for back pain.  Patient reports that she is 21 w pregnangt. This is her 10th pregnancy. She started having back pain yesterday, this has worsened today. She is having some mild abdominal cramping. Denies vaginal bleeding, fever, chills. She is taking Tylenol without relief.  Back pain does not radiate. Denies numbness/tingling down legs or weakness. Denies new urinary incontinence.  HPI: HPI  Problems:  Patient Active Problem List   Diagnosis Date Noted   AMA (advanced maternal age) multigravida 3+, second trimester 08/02/2022   Hx of preeclampsia, prior pregnancy, currently pregnant 08/01/2022   Supervision of high risk pregnancy, antepartum 07/04/2022   Depression affecting pregnancy 03/06/2018   Vitamin D deficiency 12/14/2017   Murmur, cardiac 11/30/2016   Anemia 11/30/2016   Migraine 03/18/2016   Subclinical hyperthyroidism 12/09/2015   Chronic hypertension affecting pregnancy 04/09/2011    Allergies: No Known Allergies Medications:  Current Outpatient Medications:    acetaminophen (TYLENOL) 500 MG tablet, Take 500 mg by mouth every 6 (six) hours as needed., Disp: , Rfl:    aspirin EC 81 MG tablet, Take 1 tablet (81 mg total) by mouth daily. Take  after 12 weeks for prevention of preeclampsia later in pregnancy, Disp: 300 tablet, Rfl: 2   benzonatate (TESSALON) 100 MG capsule, Take 1 capsule (100 mg total) by mouth every 8 (eight) hours., Disp: 30 capsule, Rfl: 0   cyclobenzaprine (FLEXERIL) 10 MG tablet, Take 1 tablet (10 mg total) by mouth 2 (two) times daily as needed for up to 10  doses for muscle spasms. (Patient not taking: Reported on 08/29/2022), Disp: 10 tablet, Rfl: 0   metoCLOPramide (REGLAN) 10 MG tablet, Take 1 tablet (10 mg total) by mouth every 6 (six) hours as needed for nausea or vomiting., Disp: 30 tablet, Rfl: 0   NIFEdipine (PROCARDIA XL) 30 MG 24 hr tablet, Take 1 tablet (30 mg total) by mouth daily., Disp: 30 tablet, Rfl: 5   prenatal vitamin w/FE, FA (PRENATAL 1 + 1) 27-1 MG TABS tablet, Take 1 tablet by mouth daily at 12 noon., Disp: 30 tablet, Rfl: 11   promethazine (PHENERGAN) 25 MG tablet, Take 0.5-1 tablets (12.5-25 mg total) by mouth every 6 (six) hours as needed for nausea or vomiting., Disp: 30 tablet, Rfl: 2  Observations/Objective: Patient is well-developed, well-nourished in no acute distress.  Resting comfortably at home.  Head is normocephalic, atraumatic.  No labored breathing. Speech is clear and coherent with logical content.  Patient is alert and oriented at baseline.   Assessment and Plan: 1. Acute bilateral low back pain without sciatica Patient was instructed to call her ob's on-call nurse regarding symptoms. If she is unable to get in touch with them, I instructed her to go to [facility] for evaluation given abdominal cramping. Work note provided.   Follow Up Instructions: I discussed the assessment and treatment plan with the patient. The patient was provided an opportunity to ask questions and all were answered. The patient agreed with the plan and demonstrated an understanding of the instructions.  A copy of instructions were sent to the patient via MyChart unless otherwise noted below.   The patient was advised to call back or seek an in-person evaluation if the symptoms worsen or if the condition fails to improve as anticipated.  Time:  I spent 5-10 minutes with the patient via telehealth technology discussing the above problems/concerns.    Inda Coke, [place]

## 2022-09-04 ENCOUNTER — Encounter (HOSPITAL_COMMUNITY): Payer: Self-pay | Admitting: Obstetrics and Gynecology

## 2022-09-04 ENCOUNTER — Inpatient Hospital Stay (HOSPITAL_BASED_OUTPATIENT_CLINIC_OR_DEPARTMENT_OTHER): Payer: Medicaid Other

## 2022-09-04 ENCOUNTER — Inpatient Hospital Stay (HOSPITAL_COMMUNITY)
Admission: AD | Admit: 2022-09-04 | Discharge: 2022-09-04 | Disposition: A | Discharge status: Home / Self Care | Payer: Medicaid Other | Attending: Obstetrics and Gynecology | Admitting: Obstetrics and Gynecology

## 2022-09-04 ENCOUNTER — Inpatient Hospital Stay (HOSPITAL_COMMUNITY): Payer: Medicaid Other

## 2022-09-04 ENCOUNTER — Other Ambulatory Visit: Payer: Self-pay

## 2022-09-04 DIAGNOSIS — M545 Low back pain, unspecified: Secondary | ICD-10-CM | POA: Diagnosis not present

## 2022-09-04 DIAGNOSIS — O09522 Supervision of elderly multigravida, second trimester: Secondary | ICD-10-CM | POA: Diagnosis not present

## 2022-09-04 DIAGNOSIS — O10912 Unspecified pre-existing hypertension complicating pregnancy, second trimester: Secondary | ICD-10-CM | POA: Diagnosis not present

## 2022-09-04 DIAGNOSIS — Z3A21 21 weeks gestation of pregnancy: Secondary | ICD-10-CM | POA: Diagnosis not present

## 2022-09-04 DIAGNOSIS — O09212 Supervision of pregnancy with history of pre-term labor, second trimester: Secondary | ICD-10-CM

## 2022-09-04 DIAGNOSIS — E079 Disorder of thyroid, unspecified: Secondary | ICD-10-CM

## 2022-09-04 DIAGNOSIS — R1030 Lower abdominal pain, unspecified: Secondary | ICD-10-CM | POA: Diagnosis not present

## 2022-09-04 DIAGNOSIS — O26892 Other specified pregnancy related conditions, second trimester: Secondary | ICD-10-CM

## 2022-09-04 DIAGNOSIS — O99891 Other specified diseases and conditions complicating pregnancy: Secondary | ICD-10-CM | POA: Diagnosis not present

## 2022-09-04 DIAGNOSIS — O99282 Endocrine, nutritional and metabolic diseases complicating pregnancy, second trimester: Secondary | ICD-10-CM

## 2022-09-04 DIAGNOSIS — O10012 Pre-existing essential hypertension complicating pregnancy, second trimester: Secondary | ICD-10-CM | POA: Diagnosis not present

## 2022-09-04 DIAGNOSIS — M549 Dorsalgia, unspecified: Secondary | ICD-10-CM | POA: Diagnosis not present

## 2022-09-04 DIAGNOSIS — O09292 Supervision of pregnancy with other poor reproductive or obstetric history, second trimester: Secondary | ICD-10-CM

## 2022-09-04 DIAGNOSIS — R109 Unspecified abdominal pain: Secondary | ICD-10-CM

## 2022-09-04 LAB — CBC WITH DIFFERENTIAL/PLATELET
Abs Immature Granulocytes: 0.13 10*3/uL — ABNORMAL HIGH (ref 0.00–0.07)
Basophils Absolute: 0 10*3/uL (ref 0.0–0.1)
Basophils Relative: 0 %
Eosinophils Absolute: 0.2 10*3/uL (ref 0.0–0.5)
Eosinophils Relative: 2 %
HCT: 35.2 % — ABNORMAL LOW (ref 36.0–46.0)
Hemoglobin: 11.7 g/dL — ABNORMAL LOW (ref 12.0–15.0)
Immature Granulocytes: 1 %
Lymphocytes Relative: 18 %
Lymphs Abs: 2 10*3/uL (ref 0.7–4.0)
MCH: 28.5 pg (ref 26.0–34.0)
MCHC: 33.2 g/dL (ref 30.0–36.0)
MCV: 85.6 fL (ref 80.0–100.0)
Monocytes Absolute: 0.8 10*3/uL (ref 0.1–1.0)
Monocytes Relative: 7 %
Neutro Abs: 7.9 10*3/uL — ABNORMAL HIGH (ref 1.7–7.7)
Neutrophils Relative %: 72 %
Platelets: 228 10*3/uL (ref 150–400)
RBC: 4.11 MIL/uL (ref 3.87–5.11)
RDW: 13.4 % (ref 11.5–15.5)
WBC: 11 10*3/uL — ABNORMAL HIGH (ref 4.0–10.5)
nRBC: 0 % (ref 0.0–0.2)

## 2022-09-04 LAB — COMPREHENSIVE METABOLIC PANEL
ALT: 22 U/L (ref 0–44)
AST: 26 U/L (ref 15–41)
Albumin: 2.9 g/dL — ABNORMAL LOW (ref 3.5–5.0)
Alkaline Phosphatase: 51 U/L (ref 38–126)
Anion gap: 7 (ref 5–15)
BUN: 7 mg/dL (ref 6–20)
CO2: 23 mmol/L (ref 22–32)
Calcium: 8.6 mg/dL — ABNORMAL LOW (ref 8.9–10.3)
Chloride: 103 mmol/L (ref 98–111)
Creatinine, Ser: 0.68 mg/dL (ref 0.44–1.00)
GFR, Estimated: >60 mL/min (ref >60)
Glucose, Bld: 76 mg/dL (ref 70–99)
Potassium: 3.4 mmol/L — ABNORMAL LOW (ref 3.5–5.1)
Sodium: 133 mmol/L — ABNORMAL LOW (ref 135–145)
Total Bilirubin: 0.5 mg/dL (ref 0.3–1.2)
Total Protein: 6.5 g/dL (ref 6.5–8.1)

## 2022-09-04 LAB — URINALYSIS, ROUTINE W REFLEX MICROSCOPIC
Bilirubin Urine: NEGATIVE
Glucose, UA: NEGATIVE mg/dL
Hgb urine dipstick: NEGATIVE
Ketones, ur: NEGATIVE mg/dL
Leukocytes,Ua: NEGATIVE
Nitrite: NEGATIVE
Protein, ur: NEGATIVE mg/dL
Specific Gravity, Urine: 1.02 (ref 1.005–1.030)
pH: 7 (ref 5.0–8.0)

## 2022-09-04 MED ORDER — HYDROMORPHONE HCL 1 MG/ML IJ SOLN
1.0000 mg | Freq: Once | INTRAMUSCULAR | Status: AC
  Administered 2022-09-04: 1 mg via INTRAVENOUS
  Filled 2022-09-04: qty 1

## 2022-09-04 MED ORDER — LACTATED RINGERS IV SOLN: INTRAVENOUS | Status: DC

## 2022-09-04 MED ORDER — SODIUM CHLORIDE 0.9 % IV SOLN
25.0000 mg | Freq: Once | INTRAVENOUS | Status: AC
  Administered 2022-09-04: 25 mg via INTRAVENOUS
  Filled 2022-09-04: qty 1

## 2022-09-04 NOTE — MAU Note (Addendum)
Error in charting.

## 2022-09-04 NOTE — MAU Note (Addendum)
Pt reports to mau with c/o lower back pain that radiates to abd.  Reports pain started 2 days ago, but has gotten worse.  Denies LOF, or vag bleeding.  Reports pressure when urinating.  Pt states she took 2 tylenol at 0800 with no relief.

## 2022-09-04 NOTE — MAU Provider Note (Signed)
History     CSN: [id number]  Arrival date and time: 09/04/22 1310   Event Date/Time   First Provider Initiated Contact with Patient 09/04/22 1501      Chief Complaint  Patient presents with   Back Pain   Abdominal Pain   HPI [name] is a 42 y.o. year old [id number] female at 52w3dweeks gestation who presents to MAU reporting lower back pain that radiates to her lower abdomen x 2 days. She reports pressure with urination. She took 2 Tylenol at 0800 today. She last took Flexeril last night, but states "it's not working and only makes me sleepy." She denies VB or LOF. [facility]/26/2022.    OB History     Gravida  10   Para  4   Term  2   Preterm  2   AB  5   Living  4      SAB  4   IAB  1   Ectopic  0   Multiple  0   Live Births  4           Past Medical History:  Diagnosis Date   Gallstone    Headache(784.0)    Heart murmur    Hypertension    Infection    UTI   Maternal chronic hypertension in first trimester 04/09/2011   Pregnancy induced hypertension    after 2nd delivery   Vaginal Pap smear, abnormal    cant remember    Past Surgical History:  Procedure Laterality Date   DILATION AND CURETTAGE OF UTERUS     dont remember year   LEEP      Family History  Problem Relation Age of Onset   Asthma Mother    Hypertension Mother    CAD Mother    Asthma Maternal Grandmother    Hypertension Maternal Grandmother     Social History   Tobacco Use   Smoking status: Never   Smokeless tobacco: Never  Vaping Use   Vaping Use: Never used  Substance Use Topics   Alcohol use: No    Alcohol/week: 0.0 standard drinks of alcohol   Drug use: No    Allergies: No Known Allergies  Medications Prior to Admission  Medication Sig Dispense Refill Last Dose   acetaminophen (TYLENOL) 500 MG tablet Take 500 mg by mouth every 6 (six) hours as needed.   09/04/2022   NIFEdipine (PROCARDIA XL) 30 MG  24 hr tablet Take 1 tablet (30 mg total) by mouth daily. 30 tablet 5 09/03/2022   aspirin EC 81 MG tablet Take 1 tablet (81 mg total) by mouth daily. Take after 12 weeks for prevention of preeclampsia later in pregnancy 300 tablet 2    benzonatate (TESSALON) 100 MG capsule Take 1 capsule (100 mg total) by mouth every 8 (eight) hours. 30 capsule 0    cyclobenzaprine (FLEXERIL) 10 MG tablet Take 1 tablet (10 mg total) by mouth 2 (two) times daily as needed for up to 10 doses for muscle spasms. (Patient not taking: Reported on 08/29/2022) 10 tablet 0    metoCLOPramide (REGLAN) 10 MG tablet Take 1 tablet (10 mg total) by mouth every 6 (six) hours as needed for nausea or vomiting. 30 tablet 0    prenatal vitamin w/FE, FA (PRENATAL 1 + 1) 27-1 MG TABS tablet Take 1 tablet by mouth daily at 12 noon. 30 tablet 11    promethazine (PHENERGAN) 25 MG  tablet Take 0.5-1 tablets (12.5-25 mg total) by mouth every 6 (six) hours as needed for nausea or vomiting. 30 tablet 2     Review of Systems  Constitutional: Negative.   HENT: Negative.    Eyes: Negative.   Respiratory: Negative.    Cardiovascular: Negative.   Gastrointestinal: Negative.   Endocrine: Negative.   Genitourinary:  Positive for dysuria (pressure with urination) and pelvic pain.  Musculoskeletal:  Positive for back pain.  Skin: Negative.   Allergic/Immunologic: Negative.   Neurological: Negative.   Hematological: Negative.   Psychiatric/Behavioral: Negative.     Physical Exam   Blood pressure 115/68, pulse 68, resp. rate 15, last menstrual period 04/07/2022, SpO2 99 %, currently breastfeeding.  Physical Exam Vitals and nursing note reviewed.  Constitutional:      Appearance: Normal appearance. She is normal weight.  Cardiovascular:     Rate and Rhythm: Normal rate.  Pulmonary:     Effort: Pulmonary effort is normal.  Abdominal:     Palpations: Abdomen is soft.  Genitourinary:    Comments: deferred Musculoskeletal:        General:  Normal range of motion.  Skin:    General: Skin is warm and dry.  Neurological:     Mental Status: She is alert and oriented to person, place, and time.  Psychiatric:        Mood and Affect: Mood normal.        Behavior: Behavior normal.        Thought Content: Thought content normal.        Judgment: Judgment normal.    Reassessment @ [id number]: Patient back from CT. Reviewed results with patient. Patient pain improved. LR rate increased to bolus rate. Reassessment @ 1825: Patient still feeling better. Ready to be d/c'd after IVFs are complete.  MAU Course  Procedures  MDM CCUA UCx -- Results pending  Saline Lock Dilaudid 1 mg IVP Phenergan 25 mg IVPB CBC w/Diff CMP CT Renal Stone Study  Results for orders placed or performed during the hospital encounter of 09/04/22 (from the past 24 hour(s))  Urinalysis, Routine w reflex microscopic -Urine, Clean Catch     Status: Abnormal   Collection Time: 09/04/22  1:52 PM  Result Value Ref Range   Color, Urine YELLOW YELLOW   APPearance HAZY (A) CLEAR   Specific Gravity, Urine 1.020 1.005 - 1.030   pH 7.0 5.0 - 8.0   Glucose, UA NEGATIVE NEGATIVE mg/dL   Hgb urine dipstick NEGATIVE NEGATIVE   Bilirubin Urine NEGATIVE NEGATIVE   Ketones, ur NEGATIVE NEGATIVE mg/dL   Protein, ur NEGATIVE NEGATIVE mg/dL   Nitrite NEGATIVE NEGATIVE   Leukocytes,Ua NEGATIVE NEGATIVE   RBC / HPF 0-5 0 - 5 RBC/hpf   WBC, UA 0-5 0 - 5 WBC/hpf   Bacteria, UA RARE (A) NONE SEEN   Squamous Epithelial / HPF 6-10 0 - 5 /HPF   Mucus PRESENT    Hyaline Casts, UA PRESENT   CBC with Differential/Platelet     Status: Abnormal   Collection Time: 09/04/22  3:15 PM  Result Value Ref Range   WBC 11.0 (H) 4.0 - 10.5 K/uL   RBC 4.11 3.87 - 5.11 MIL/uL   Hemoglobin 11.7 (L) 12.0 - 15.0 g/dL   HCT 35.2 (L) 36.0 - 46.0 %   MCV 85.6 80.0 - 100.0 fL   MCH 28.5 26.0 - 34.0 pg   MCHC 33.2 30.0 - 36.0 g/dL   RDW 13.4 11.5 - 15.5 %  Platelets 228 150 - 400 K/uL   nRBC  0.0 0.0 - 0.2 %   Neutrophils Relative % 72 %   Neutro Abs 7.9 (H) 1.7 - 7.7 K/uL   Lymphocytes Relative 18 %   Lymphs Abs 2.0 0.7 - 4.0 K/uL   Monocytes Relative 7 %   Monocytes Absolute 0.8 0.1 - 1.0 K/uL   Eosinophils Relative 2 %   Eosinophils Absolute 0.2 0.0 - 0.5 K/uL   Basophils Relative 0 %   Basophils Absolute 0.0 0.0 - 0.1 K/uL   Immature Granulocytes 1 %   Abs Immature Granulocytes 0.13 (H) 0.00 - 0.07 K/uL  Comprehensive metabolic panel     Status: Abnormal   Collection Time: 09/04/22  3:15 PM  Result Value Ref Range   Sodium 133 (L) 135 - 145 mmol/L   Potassium 3.4 (L) 3.5 - 5.1 mmol/L   Chloride 103 98 - 111 mmol/L   CO2 23 22 - 32 mmol/L   Glucose, Bld 76 70 - 99 mg/dL   BUN 7 6 - 20 mg/dL   Creatinine, Ser 0.68 0.44 - 1.00 mg/dL   Calcium 8.6 (L) 8.9 - 10.3 mg/dL   Total Protein 6.5 6.5 - 8.1 g/dL   Albumin 2.9 (L) 3.5 - 5.0 g/dL   AST 26 15 - 41 U/L   ALT 22 0 - 44 U/L   Alkaline Phosphatase 51 38 - 126 U/L   Total Bilirubin 0.5 0.3 - 1.2 mg/dL   GFR, Estimated >60 >60 mL/min   Anion gap 7 5 - 15     CT Renal Stone Study Result Date: 09/04/2022 CLINICAL DATA:  Abdominal/flank pain, stone suspected EXAM: CT ABDOMEN AND PELVIS WITHOUT CONTRAST TECHNIQUE: Multidetector CT imaging of the abdomen and pelvis was performed following the standard protocol without IV contrast. RADIATION DOSE REDUCTION: This exam was performed according to the departmental dose-optimization program which includes automated exposure control, adjustment of the mA and/or kV according to patient size and/or use of iterative reconstruction technique. COMPARISON:  10/15/2021 FINDINGS: Lower chest: No pleural or pericardial effusion. Stable linear scarring or subsegmental atelectasis in the anterior basal segment left lower lobe. Hepatobiliary: Distended gallbladder. No calcified gallstones. No focal liver lesion or biliary ductal dilatation. Pancreas: Unremarkable. No pancreatic ductal dilatation  or surrounding inflammatory changes. Spleen: Normal in size without focal abnormality. Adrenals/Urinary Tract: No adrenal mass. No urolithiasis or hydronephrosis. Symmetric renal contours. Urinary bladder incompletely distended. Stomach/Bowel: Stomach decompressed. Small bowel nondistended. Normal appendix. Colon is partially distended by gas and fecal material, without acute finding. Vascular/Lymphatic: No significant vascular findings are present. No enlarged abdominal or pelvic lymph nodes. Reproductive: Gravid uterus with single embryo in breech presentation. No adnexal mass. Other: Left pelvic phleboliths.  No ascites.  No free air. Musculoskeletal: No acute or significant osseous findings. IMPRESSION: 1. No acute findings. No urolithiasis or hydronephrosis. 2. Gravid uterus with single embryo in breech presentation. Electronically Signed   By: Lucrezia Europe M.D.   On: 09/04/2022 16:47    Assessment and Plan  1. Back pain affecting pregnancy in second trimester - Information provided on back pain in pregnancy   2. [redacted] weeks gestation of pregnancy   - Discharge patient - Keep scheduled appt with [facility] on 09/26/2022 - Patient verbalized an understanding of the plan of care and agrees.   Laury Deep, CNM 09/04/2022, 3:01 PM

## 2022-09-05 LAB — CULTURE, OB URINE

## 2022-09-26 ENCOUNTER — Ambulatory Visit: Payer: Medicaid Other | Attending: Maternal & Fetal Medicine

## 2022-09-26 ENCOUNTER — Ambulatory Visit (INDEPENDENT_AMBULATORY_CARE_PROVIDER_SITE_OTHER): Payer: Medicaid Other | Admitting: Obstetrics and Gynecology

## 2022-09-26 ENCOUNTER — Other Ambulatory Visit: Payer: Self-pay

## 2022-09-26 ENCOUNTER — Ambulatory Visit: Payer: Medicaid Other | Admitting: *Deleted

## 2022-09-26 VITALS — BP 130/79 | HR 72

## 2022-09-26 VITALS — BP 145/84 | HR 80 | Wt 178.0 lb

## 2022-09-26 DIAGNOSIS — E079 Disorder of thyroid, unspecified: Secondary | ICD-10-CM

## 2022-09-26 DIAGNOSIS — O09299 Supervision of pregnancy with other poor reproductive or obstetric history, unspecified trimester: Secondary | ICD-10-CM

## 2022-09-26 DIAGNOSIS — Z3A24 24 weeks gestation of pregnancy: Secondary | ICD-10-CM

## 2022-09-26 DIAGNOSIS — O10919 Unspecified pre-existing hypertension complicating pregnancy, unspecified trimester: Secondary | ICD-10-CM

## 2022-09-26 DIAGNOSIS — O09292 Supervision of pregnancy with other poor reproductive or obstetric history, second trimester: Secondary | ICD-10-CM | POA: Insufficient documentation

## 2022-09-26 DIAGNOSIS — O10912 Unspecified pre-existing hypertension complicating pregnancy, second trimester: Secondary | ICD-10-CM

## 2022-09-26 DIAGNOSIS — O09522 Supervision of elderly multigravida, second trimester: Secondary | ICD-10-CM

## 2022-09-26 DIAGNOSIS — O0990 Supervision of high risk pregnancy, unspecified, unspecified trimester: Secondary | ICD-10-CM

## 2022-09-26 DIAGNOSIS — Z5941 Food insecurity: Secondary | ICD-10-CM

## 2022-09-26 DIAGNOSIS — O99282 Endocrine, nutritional and metabolic diseases complicating pregnancy, second trimester: Secondary | ICD-10-CM

## 2022-09-26 DIAGNOSIS — O10012 Pre-existing essential hypertension complicating pregnancy, second trimester: Secondary | ICD-10-CM

## 2022-09-26 DIAGNOSIS — O09212 Supervision of pregnancy with history of pre-term labor, second trimester: Secondary | ICD-10-CM

## 2022-09-26 DIAGNOSIS — O132 Gestational [pregnancy-induced] hypertension without significant proteinuria, second trimester: Secondary | ICD-10-CM

## 2022-09-26 DIAGNOSIS — E0590 Thyrotoxicosis, unspecified without thyrotoxic crisis or storm: Secondary | ICD-10-CM

## 2022-09-26 DIAGNOSIS — Z659 Problem related to unspecified psychosocial circumstances: Secondary | ICD-10-CM

## 2022-09-26 NOTE — Progress Notes (Signed)
PRENATAL VISIT NOTE  Subjective:  Brittany [name] is a 42 y.o. [id number] at [redacted]w[redacted]d being seen today for ongoing prenatal care.  She is currently monitored for the following issues for this high-risk pregnancy and has Chronic hypertension affecting pregnancy; Subclinical hyperthyroidism; Migraine; Murmur, cardiac; Anemia; Vitamin D deficiency; Depression affecting pregnancy; Supervision of high risk pregnancy, antepartum; Hx of preeclampsia, prior pregnancy, currently pregnant; and AMA (advanced maternal age) multigravida 35+, second trimester on their problem list.  Patient reports  increased social stressors at home with teenage son and with living situatioin .  Contractions: Irritability. Vag. Bleeding: None.  Movement: Present. Denies leaking of fluid.   The following portions of the patient's history were reviewed and updated as appropriate: allergies, current medications, past family history, past medical history, past social history, past surgical history and problem list.   Objective:   Vitals:   09/26/22 1546 09/26/22 1621  BP: (!) 154/90 (!) 145/84  Pulse: 80   Weight: 178 lb (80.7 kg)     Fetal Status: Fetal Heart Rate (bpm): 148   Movement: Present     General:  Alert, oriented and cooperative. Patient is in no acute distress.  Skin: Skin is warm and dry. No rash noted.   Cardiovascular: Normal heart rate noted  Respiratory: Normal respiratory effort, no problems with respiration noted  Abdomen: Soft, gravid, appropriate for gestational age.  Pain/Pressure: Present     Pelvic: Cervical exam deferred        Extremities: Normal range of motion.  Edema: None  Mental Status: Normal mood and affect. Normal behavior. Normal judgment and thought content.   Assessment and Plan:  Pregnancy: [id number] at [redacted]w[redacted]d 1. Chronic hypertension affecting pregnancy Didn't take meds today; pt is on low dose asa and procardia 30 qday Continue surveillance growth u/s for now. No signs of  pre-eclampsia 3/11: 24%, 667g, ac 30%  2. Transient hypertension of pregnancy in second trimester  3. AMA (advanced maternal age) multigravida 35+, second trimester  4. Subclinical hyperthyroidism Repeat TFTs with 28wk labs  5. Supervision of high risk pregnancy, antepartum - AMBULATORY REFERRAL TO [facility] - Ambulatory referral to [facility]  6. Hx of preeclampsia, prior pregnancy, currently pregnant   7. Food insecurity Patient states her and her husband both work and don't qualify for food stamps. She also states they are having issues with their living situation and are in danger of having trouble finding a new place to rent. SW saw her today and RN her to contact her about going to the food pantry at the main clinic - AMBULATORY REFERRAL TO [facility]  8. Other social stressor Patient lives with her husband, 5 y/o son and their other children; the husband is the father to all the children. 66 y/o son has outbursts and is verbally threatening and they are limiting contact with him to keep him from acting out. Their pediatrician suggested taking him to [facility].  Referral placed to jamie at the main clinic to see about any available resources, help  Preterm labor symptoms and general obstetric precautions including but not limited to vaginal bleeding, contractions, leaking of fluid and fetal movement were reviewed in detail with the patient. Please refer to After Visit Summary for other counseling recommendations.   Return in about 1 week (around 10/03/2022) for in person, rn visit, bp check.  Future Appointments  Date Time Provider Department Center  10/24/2022  7:15 AM [facility] NURSE [facility]  10/24/2022  7:30 AM [facility]2 [facility]  10/24/2022  9:00 AM [facility]  10/24/2022  2:30 PM Anyanwu, Jethro Bastos, MD [facility]  11/07/2022  2:30 PM Reva Bores, MD [facility]  11/21/2022  2:30 PM  Anyanwu, Jethro Bastos, MD [facility]  12/05/2022  2:30 PM Anyanwu, Jethro Bastos, MD [facility]    Barron Bing, MD

## 2022-09-26 NOTE — Progress Notes (Unsigned)
ROB   Pt notes personal stressors which may be due to B/P being elevated.  Pt notes not taking Rx  Repeat 145/84.

## 2022-09-28 ENCOUNTER — Encounter: Payer: Self-pay | Admitting: Obstetrics and Gynecology

## 2022-09-28 ENCOUNTER — Inpatient Hospital Stay (HOSPITAL_COMMUNITY)
Admission: AD | Admit: 2022-09-28 | Discharge: 2022-09-28 | Disposition: A | Discharge status: Home / Self Care | Payer: PRIVATE HEALTH INSURANCE | Attending: Obstetrics and Gynecology | Admitting: Obstetrics and Gynecology

## 2022-09-28 ENCOUNTER — Other Ambulatory Visit: Payer: Self-pay

## 2022-09-28 ENCOUNTER — Encounter (HOSPITAL_COMMUNITY): Payer: Self-pay | Admitting: Obstetrics and Gynecology

## 2022-09-28 DIAGNOSIS — Z3A24 24 weeks gestation of pregnancy: Secondary | ICD-10-CM | POA: Insufficient documentation

## 2022-09-28 DIAGNOSIS — R519 Headache, unspecified: Secondary | ICD-10-CM | POA: Diagnosis not present

## 2022-09-28 DIAGNOSIS — O26892 Other specified pregnancy related conditions, second trimester: Secondary | ICD-10-CM | POA: Diagnosis present

## 2022-09-28 DIAGNOSIS — R1030 Lower abdominal pain, unspecified: Secondary | ICD-10-CM | POA: Diagnosis not present

## 2022-09-28 LAB — URINALYSIS, ROUTINE W REFLEX MICROSCOPIC
Bilirubin Urine: NEGATIVE
Glucose, UA: NEGATIVE mg/dL
Hgb urine dipstick: NEGATIVE
Ketones, ur: NEGATIVE mg/dL
Leukocytes,Ua: NEGATIVE
Nitrite: NEGATIVE
Protein, ur: NEGATIVE mg/dL
Specific Gravity, Urine: 1.006 (ref 1.005–1.030)
pH: 7 (ref 5.0–8.0)

## 2022-09-28 MED ORDER — METOCLOPRAMIDE HCL 5 MG/ML IJ SOLN
10.0000 mg | Freq: Once | INTRAMUSCULAR | Status: AC
  Administered 2022-09-28: 10 mg via INTRAVENOUS
  Filled 2022-09-28: qty 2

## 2022-09-28 MED ORDER — LACTATED RINGERS IV BOLUS
1000.0000 mL | Freq: Once | INTRAVENOUS | Status: AC
  Administered 2022-09-28: 1000 mL via INTRAVENOUS

## 2022-09-28 MED ORDER — DIPHENHYDRAMINE HCL 50 MG/ML IJ SOLN
25.0000 mg | Freq: Once | INTRAMUSCULAR | Status: AC
  Administered 2022-09-28: 25 mg via INTRAVENOUS
  Filled 2022-09-28: qty 1

## 2022-09-28 MED ORDER — KETOROLAC TROMETHAMINE 30 MG/ML IJ SOLN
30.0000 mg | Freq: Once | INTRAMUSCULAR | Status: AC
  Administered 2022-09-28: 30 mg via INTRAVENOUS
  Filled 2022-09-28: qty 1

## 2022-09-28 NOTE — Discharge Instructions (Signed)

## 2022-09-28 NOTE — MAU Note (Signed)
.  Brittany [name] is a 42 y.o. at [redacted]w[redacted]d here in MAU reporting: constant lower abdominal cramping that started a couple of hours ago. Denies VB or LOF. +FM. Reports HA all day; took tylenol 1000mg  3 hours ago - not working. Denies visual changes or epigastric pain.   Onset of complaint: 1900 Pain score: 10 - abdomen; 8 - HA  Vitals:   09/28/22 2115  BP: 131/74  Pulse: 89  Resp: 20  Temp: 98.6 F (37 C)  SpO2: 99%     FHT:165 Lab orders placed from triage:  UA

## 2022-09-28 NOTE — MAU Provider Note (Signed)
History     CSN: [id number]  Arrival date and time: 09/28/22 2050   Event Date/Time   First Provider Initiated Contact with Patient 09/28/22 2144      Chief Complaint  Patient presents with   Abdominal Pain   HPI  Brittany [name] is a 42 y.o. [id number] at [id number] presents for evaluation of a headache. Patient reports she has been having ongoing headaches for several weeks. She states she has tried tylenol without relief. She reports she is eating and drinking lots of water. She reports she is sensitive to light and sound. Patient rates the pain as a 10/10. She also reports lower abdominal cramping that is all the time. She denies any vaginal bleeding, discharge, and leaking of fluid. Denies any constipation, diarrhea or any urinary complaints. Reports normal fetal movement.   OB History     Gravida  10   Para  4   Term  2   Preterm  2   AB  5   Living  4      SAB  4   IAB  1   Ectopic  0   Multiple  0   Live Births  4           Past Medical History:  Diagnosis Date   Gallstone    Headache(784.0)    Heart murmur    Hypertension    Infection    UTI   Maternal chronic hypertension in first trimester 04/09/2011   Pregnancy induced hypertension    after 2nd delivery   Vaginal Pap smear, abnormal    cant remember    Past Surgical History:  Procedure Laterality Date   DILATION AND CURETTAGE OF UTERUS     dont remember year   LEEP      Family History  Problem Relation Age of Onset   Asthma Mother    Hypertension Mother    CAD Mother    Asthma Maternal Grandmother    Hypertension Maternal Grandmother     Social History   Tobacco Use   Smoking status: Never   Smokeless tobacco: Never  Vaping Use   Vaping Use: Never used  Substance Use Topics   Alcohol use: No    Alcohol/week: 0.0 standard drinks of alcohol   Drug use: No    Allergies: No Known Allergies  Medications Prior to Admission  Medication Sig Dispense Refill Last Dose    acetaminophen (TYLENOL) 500 MG tablet Take 1,000 mg by mouth every 6 (six) hours as needed.   09/28/2022 at 1800   aspirin EC 81 MG tablet Take 1 tablet (81 mg total) by mouth daily. Take after 12 weeks for prevention of preeclampsia later in pregnancy 300 tablet 2 09/27/2022   NIFEdipine (PROCARDIA XL) 30 MG 24 hr tablet Take 1 tablet (30 mg total) by mouth daily. 30 tablet 5 09/27/2022   prenatal vitamin w/FE, FA (PRENATAL 1 + 1) 27-1 MG TABS tablet Take 1 tablet by mouth daily at 12 noon. 30 tablet 11 09/27/2022   cyclobenzaprine (FLEXERIL) 10 MG tablet Take 1 tablet (10 mg total) by mouth 2 (two) times daily as needed for up to 10 doses for muscle spasms. (Patient not taking: Reported on 08/29/2022) 10 tablet 0    metoCLOPramide (REGLAN) 10 MG tablet Take 1 tablet (10 mg total) by mouth every 6 (six) hours as needed for nausea or vomiting. (Patient not taking: Reported on 09/26/2022) 30 tablet 0    promethazine (PHENERGAN) 25 MG  tablet Take 0.5-1 tablets (12.5-25 mg total) by mouth every 6 (six) hours as needed for nausea or vomiting. 30 tablet 2     Review of Systems  Constitutional: Negative.  Negative for fatigue and fever.  HENT: Negative.    Respiratory: Negative.  Negative for shortness of breath.   Cardiovascular: Negative.  Negative for chest pain.  Gastrointestinal: Negative.  Negative for abdominal pain, constipation, diarrhea, nausea and vomiting.  Genitourinary: Negative.  Negative for dysuria, vaginal bleeding and vaginal discharge.  Neurological:  Positive for headaches. Negative for dizziness.   Physical Exam   Blood pressure 123/70, pulse 79, temperature 98.6 F (37 C), temperature source Oral, resp. rate 18, height '5\' 5"'$  (1.651 m), weight 80.7 kg, last menstrual period 04/07/2022, SpO2 99 %, currently breastfeeding.  Patient Vitals for the past 24 hrs:  BP Temp Temp src Pulse Resp SpO2 Height Weight  09/28/22 2150 -- -- -- -- -- 99 % -- --  09/28/22 2145 123/70 -- -- 79 --  99 % -- --  09/28/22 2142 123/70 -- -- -- 18 -- -- --  09/28/22 2140 -- -- -- -- -- 98 % -- --  09/28/22 2135 -- -- -- -- -- 99 % -- --  09/28/22 2115 131/74 98.6 F (37 C) Oral 89 20 99 % '5\' 5"'$  (1.651 m) 80.7 kg    Physical Exam Vitals and nursing note reviewed.  Constitutional:      General: She is not in acute distress.    Appearance: She is well-developed.  HENT:     Head: Normocephalic.  Eyes:     Pupils: Pupils are equal, round, and reactive to light.  Cardiovascular:     Rate and Rhythm: Normal rate and regular rhythm.     Heart sounds: Normal heart sounds.  Pulmonary:     Effort: Pulmonary effort is normal. No respiratory distress.     Breath sounds: Normal breath sounds.  Abdominal:     General: Bowel sounds are normal. There is no distension.     Palpations: Abdomen is soft.     Tenderness: There is no abdominal tenderness.  Skin:    General: Skin is warm and dry.  Neurological:     Mental Status: She is alert and oriented to person, place, and time.     Motor: No abnormal muscle tone.     Coordination: Coordination normal.     Deep Tendon Reflexes: Reflexes are normal and symmetric. Reflexes normal.  Psychiatric:        Behavior: Behavior normal.        Thought Content: Thought content normal.        Judgment: Judgment normal.     Fetal Tracing:  Baseline: 150 Variability: moderate Accels: none Decels: none  Toco: none  Dilation: Closed Exam by:: Len Blalock, CNM.   MAU Course  Procedures  Results for orders placed or performed during the hospital encounter of 09/28/22 (from the past 24 hour(s))  Urinalysis, Routine w reflex microscopic -Urine, Clean Catch     Status: Abnormal   Collection Time: 09/28/22  9:25 PM  Result Value Ref Range   Color, Urine STRAW (A) YELLOW   APPearance CLEAR CLEAR   Specific Gravity, Urine 1.006 1.005 - 1.030   pH 7.0 5.0 - 8.0   Glucose, UA NEGATIVE NEGATIVE mg/dL   Hgb urine dipstick NEGATIVE NEGATIVE    Bilirubin Urine NEGATIVE NEGATIVE   Ketones, ur NEGATIVE NEGATIVE mg/dL   Protein, ur NEGATIVE NEGATIVE mg/dL  Nitrite NEGATIVE NEGATIVE   Leukocytes,Ua NEGATIVE NEGATIVE     MDM Labs ordered and reviewed.   UA LR bolus Reglan Benedryl  Toradol  Patient reports improvement of pain. Discussed that HA could be a side effect of procardia and could consider changing medication if persistent  Assessment and Plan   1. Pregnancy headache in second trimester   2. [redacted] weeks gestation of pregnancy    -Discharge home in stable condition -Second trimester precautions discussed -Patient advised to follow-up with OB as scheduled for prenatal care -Patient may return to MAU as needed or if her condition were to change or worsen  Wende Mott, CNM 09/28/2022, 9:44 PM

## 2022-09-29 ENCOUNTER — Encounter: Payer: Self-pay | Admitting: Family

## 2022-10-04 ENCOUNTER — Other Ambulatory Visit: Payer: Self-pay | Admitting: *Deleted

## 2022-10-04 MED ORDER — LABETALOL HCL 200 MG PO TABS: 200.0000 mg | ORAL_TABLET | Freq: Two times a day (BID) | ORAL | 3 refills | Status: DC

## 2022-10-06 ENCOUNTER — Encounter: Payer: Self-pay | Admitting: Obstetrics and Gynecology

## 2022-10-10 ENCOUNTER — Encounter: Payer: Self-pay | Admitting: Obstetrics and Gynecology

## 2022-10-10 ENCOUNTER — Inpatient Hospital Stay (HOSPITAL_COMMUNITY)
Admission: AD | Admit: 2022-10-10 | Discharge: 2022-10-10 | Disposition: A | Discharge status: Home / Self Care | Payer: PRIVATE HEALTH INSURANCE | Attending: Obstetrics & Gynecology | Admitting: Obstetrics & Gynecology

## 2022-10-10 ENCOUNTER — Encounter (HOSPITAL_COMMUNITY): Payer: Self-pay | Admitting: Obstetrics & Gynecology

## 2022-10-10 ENCOUNTER — Ambulatory Visit (INDEPENDENT_AMBULATORY_CARE_PROVIDER_SITE_OTHER): Payer: Medicaid Other

## 2022-10-10 VITALS — BP 133/80 | HR 83

## 2022-10-10 DIAGNOSIS — O0990 Supervision of high risk pregnancy, unspecified, unspecified trimester: Secondary | ICD-10-CM

## 2022-10-10 DIAGNOSIS — E876 Hypokalemia: Secondary | ICD-10-CM | POA: Insufficient documentation

## 2022-10-10 DIAGNOSIS — Z3A26 26 weeks gestation of pregnancy: Secondary | ICD-10-CM | POA: Insufficient documentation

## 2022-10-10 DIAGNOSIS — R42 Dizziness and giddiness: Secondary | ICD-10-CM | POA: Diagnosis not present

## 2022-10-10 DIAGNOSIS — R1030 Lower abdominal pain, unspecified: Secondary | ICD-10-CM | POA: Insufficient documentation

## 2022-10-10 DIAGNOSIS — N898 Other specified noninflammatory disorders of vagina: Secondary | ICD-10-CM

## 2022-10-10 DIAGNOSIS — R102 Pelvic and perineal pain: Secondary | ICD-10-CM

## 2022-10-10 DIAGNOSIS — Z3A27 27 weeks gestation of pregnancy: Secondary | ICD-10-CM

## 2022-10-10 DIAGNOSIS — O26892 Other specified pregnancy related conditions, second trimester: Secondary | ICD-10-CM | POA: Insufficient documentation

## 2022-10-10 DIAGNOSIS — O10012 Pre-existing essential hypertension complicating pregnancy, second trimester: Secondary | ICD-10-CM

## 2022-10-10 DIAGNOSIS — O99282 Endocrine, nutritional and metabolic diseases complicating pregnancy, second trimester: Secondary | ICD-10-CM | POA: Diagnosis not present

## 2022-10-10 DIAGNOSIS — G43009 Migraine without aura, not intractable, without status migrainosus: Secondary | ICD-10-CM

## 2022-10-10 DIAGNOSIS — E871 Hypo-osmolality and hyponatremia: Secondary | ICD-10-CM | POA: Diagnosis not present

## 2022-10-10 DIAGNOSIS — O10919 Unspecified pre-existing hypertension complicating pregnancy, unspecified trimester: Secondary | ICD-10-CM

## 2022-10-10 LAB — CBC
HCT: 35.3 % — ABNORMAL LOW (ref 36.0–46.0)
Hemoglobin: 11.4 g/dL — ABNORMAL LOW (ref 12.0–15.0)
MCH: 28.1 pg (ref 26.0–34.0)
MCHC: 32.3 g/dL (ref 30.0–36.0)
MCV: 86.9 fL (ref 80.0–100.0)
Platelets: 222 10*3/uL (ref 150–400)
RBC: 4.06 MIL/uL (ref 3.87–5.11)
RDW: 13.6 % (ref 11.5–15.5)
WBC: 12.1 10*3/uL — ABNORMAL HIGH (ref 4.0–10.5)
nRBC: 0 % (ref 0.0–0.2)

## 2022-10-10 LAB — COMPREHENSIVE METABOLIC PANEL
ALT: 17 U/L (ref 0–44)
AST: 20 U/L (ref 15–41)
Albumin: 2.7 g/dL — ABNORMAL LOW (ref 3.5–5.0)
Alkaline Phosphatase: 57 U/L (ref 38–126)
Anion gap: 6 (ref 5–15)
BUN: 7 mg/dL (ref 6–20)
CO2: 21 mmol/L — ABNORMAL LOW (ref 22–32)
Calcium: 8 mg/dL — ABNORMAL LOW (ref 8.9–10.3)
Chloride: 105 mmol/L (ref 98–111)
Creatinine, Ser: 0.61 mg/dL (ref 0.44–1.00)
GFR, Estimated: >60 mL/min (ref >60)
Glucose, Bld: 77 mg/dL (ref 70–99)
Potassium: 3.3 mmol/L — ABNORMAL LOW (ref 3.5–5.1)
Sodium: 132 mmol/L — ABNORMAL LOW (ref 135–145)
Total Bilirubin: 0.3 mg/dL (ref 0.3–1.2)
Total Protein: 6.5 g/dL (ref 6.5–8.1)

## 2022-10-10 LAB — WET PREP, GENITAL
Clue Cells Wet Prep HPF POC: NONE SEEN
Sperm: NONE SEEN
Trich, Wet Prep: NONE SEEN
WBC, Wet Prep HPF POC: >=10 — AB (ref <10)
Yeast Wet Prep HPF POC: NONE SEEN

## 2022-10-10 MED ORDER — ONDANSETRON 4 MG PO TBDP: 4.0000 mg | ORAL_TABLET | Freq: Three times a day (TID) | ORAL | 0 refills | Status: DC | PRN

## 2022-10-10 MED ORDER — FAMOTIDINE 20 MG PO TABS
20.0000 mg | ORAL_TABLET | Freq: Once | ORAL | Status: AC
  Administered 2022-10-10: 20 mg via ORAL
  Filled 2022-10-10: qty 1

## 2022-10-10 MED ORDER — LACTATED RINGERS IV BOLUS
1000.0000 mL | Freq: Once | INTRAVENOUS | Status: AC
  Administered 2022-10-10: 1000 mL via INTRAVENOUS

## 2022-10-10 MED ORDER — ONDANSETRON 4 MG PO TBDP
4.0000 mg | ORAL_TABLET | Freq: Once | ORAL | Status: AC
  Administered 2022-10-10: 4 mg via ORAL
  Filled 2022-10-10: qty 1

## 2022-10-10 NOTE — Discharge Instructions (Signed)
It was great seeing you today.  I am sorry do not feel well.  Your potassium and sodium were slightly low but not enough where we need to make any adjustments to any medications.  I do recommend he start taking a daily prenatal vitamin.  A regular diet will also help please get back to normal limits.  I sent in some Zofran which you can let dissolve under your tongue.  If you have any issues your symptoms get worse please return.  If you have any vaginal bleeding, gush of fluid, contractions please also return for evaluation.  I hope you have a wonderful night!

## 2022-10-10 NOTE — Progress Notes (Signed)
Subjective:  Brittany [name] is a 42 y.o. female here for BP check.   Hypertension ROS: Patient notes headaches, visual symptoms, RUQ/epigastric pain or other concerning symptoms.  Objective:  LMP 04/07/2022 (Exact Date)   Appearance alert, well appearing, and in no distress. General exam BP noted to be stable today in office.    Assessment:   Blood Pressure stable.   Plan:  Keep scheduled appt .

## 2022-10-10 NOTE — MAU Provider Note (Signed)
History     CSN: [id number]  Arrival date and time: 10/10/22 1743   Event Date/Time   First Provider Initiated Contact with Patient 10/10/22 1811      Chief Complaint  Patient presents with   Abdominal Pain   Nausea   Dizziness   Patient presenting today for evaluation for cramping as well as weakness and lightheadedness.  She reports the cramping has been going on for approximately 1 week but has not seen any vaginal bleeding, gush of fluid, contractions and she feels good baby movement.  She also reports that this afternoon around 1330 he felt lightheaded and felt like she was going to pass out.  Reports that she ate breakfast this morning around 8 AM but has not had anything to eat or drink since then.  Also reports increased vaginal discharge.  Denies any other symptoms.   OB History     Gravida  10   Para  4   Term  2   Preterm  2   AB  5   Living  4      SAB  4   IAB  1   Ectopic  0   Multiple  0   Live Births  4           Past Medical History:  Diagnosis Date   Gallstone    Headache(784.0)    Heart murmur    Hypertension    Infection    UTI   Maternal chronic hypertension in first trimester 04/09/2011   Pregnancy induced hypertension    after 2nd delivery   Vaginal Pap smear, abnormal    cant remember    Past Surgical History:  Procedure Laterality Date   DILATION AND CURETTAGE OF UTERUS     dont remember year   LEEP      Family History  Problem Relation Age of Onset   Asthma Mother    Hypertension Mother    CAD Mother    Asthma Maternal Grandmother    Hypertension Maternal Grandmother     Social History   Tobacco Use   Smoking status: Never   Smokeless tobacco: Never  Vaping Use   Vaping Use: Never used  Substance Use Topics   Alcohol use: No    Alcohol/week: 0.0 standard drinks of alcohol   Drug use: No    Allergies: No Known Allergies  Medications Prior to Admission  Medication Sig Dispense Refill Last Dose    aspirin EC 81 MG tablet Take 1 tablet (81 mg total) by mouth daily. Take after 12 weeks for prevention of preeclampsia later in pregnancy 300 tablet 2 10/09/2022   labetalol (NORMODYNE) 200 MG tablet Take 1 tablet (200 mg total) by mouth 2 (two) times daily. 60 tablet 3 10/10/2022   prenatal vitamin w/FE, FA (PRENATAL 1 + 1) 27-1 MG TABS tablet Take 1 tablet by mouth daily at 12 noon. 30 tablet 11 10/10/2022   acetaminophen (TYLENOL) 500 MG tablet Take 1,000 mg by mouth every 6 (six) hours as needed.      cyclobenzaprine (FLEXERIL) 10 MG tablet Take 1 tablet (10 mg total) by mouth 2 (two) times daily as needed for up to 10 doses for muscle spasms. (Patient not taking: Reported on 08/29/2022) 10 tablet 0    metoCLOPramide (REGLAN) 10 MG tablet Take 1 tablet (10 mg total) by mouth every 6 (six) hours as needed for nausea or vomiting. (Patient not taking: Reported on 09/26/2022) 30 tablet 0  NIFEdipine (PROCARDIA XL) 30 MG 24 hr tablet Take 1 tablet (30 mg total) by mouth daily. (Patient not taking: Reported on 10/10/2022) 30 tablet 5    promethazine (PHENERGAN) 25 MG tablet Take 0.5-1 tablets (12.5-25 mg total) by mouth every 6 (six) hours as needed for nausea or vomiting. 30 tablet 2     Review of Systems  Constitutional:  Negative for chills and fever.  HENT:  Negative for congestion and rhinorrhea.   Eyes:  Negative for visual disturbance.  Respiratory:  Negative for shortness of breath.   Cardiovascular:  Negative for chest pain.  Gastrointestinal:  Positive for abdominal pain (cramping).  Genitourinary:  Positive for vaginal discharge. Negative for vaginal bleeding.  Neurological:  Positive for dizziness and light-headedness.   Physical Exam   Blood pressure 124/79, pulse 75, resp. rate 14, last menstrual period 04/07/2022, SpO2 100 %, currently breastfeeding.  Physical Exam Constitutional:      Appearance: She is well-developed.  HENT:     Head: Normocephalic.  Eyes:     Extraocular  Movements: Extraocular movements intact.  Cardiovascular:     Rate and Rhythm: Normal rate.  Pulmonary:     Effort: Pulmonary effort is normal.  Abdominal:     General: There is no distension.     Palpations: Abdomen is soft.     Tenderness: There is abdominal tenderness in the epigastric area.     Comments: gravid  Genitourinary:    Vagina: Vaginal discharge present. No tenderness or bleeding.     Comments: 0.5/thick/high Skin:    General: Skin is warm.     Capillary Refill: Capillary refill takes less than 2 seconds.  Neurological:     General: No focal deficit present.     Mental Status: She is alert.    MAU Course  Procedures  MDM CBC CMP LR bolus Pepcid Zofran   Assessment and Plan  Brittany [name] is a 42 yo [id number] presenting for cramping for 1 week as well as dizziness today.   Dizziness in pregnancy  CBC was reassuring.  CMP showed mild hyponatremia and mild hypokalemia.  Patient reports she has not been eating well because of her nausea.  Provided patient with fluids, Zofran, Pepcid.  Patient reports that she feels much better.  Prescription sent for Zofran to patient's pharmacy.  Instructed to start taking prenatal vitamin and encouraged regular diet.  Cramping Pelvic exam was reassuring, no bleeding noted from cervical os, wet prep and GC/chlamydia collected.  Wet prep negative, GC chlamydia pending at time of discharge.  NST was reassuring.  Discussed strict return precautions and patient is agreeable.  Patient okay for discharge home.  Concepcion Living 10/10/2022, 6:22 PM

## 2022-10-10 NOTE — MAU Note (Signed)
.  Brittany [name] is a 42 y.o. at [redacted]w[redacted]d here in MAU reporting: started feeling like she was going to pass out around 1330 and nauseated. She also has been experiencing lower abdominal cramping x1 week. Has not had anything to eat since 0800 this morning. Denies VB or LOF. +FM.   Pain score: 8 Vitals:   10/10/22 1751  BP: 139/84  Pulse: 81  Resp: 14  SpO2: 100%     FHT:139 Lab orders placed from triage:  UA

## 2022-10-10 NOTE — MAU Note (Signed)
980ml IV bolus infused-pt tolerated clear liquids and crackers.

## 2022-10-11 LAB — GC/CHLAMYDIA PROBE AMP (~~LOC~~) NOT AT ARMC
Chlamydia: NEGATIVE
Comment: NEGATIVE
Comment: NORMAL
Neisseria Gonorrhea: NEGATIVE

## 2022-10-12 MED ORDER — BUTALBITAL-APAP-CAFFEINE 50-300-40 MG PO CAPS: 1.0000 | ORAL_CAPSULE | Freq: Every day | ORAL | 0 refills | Status: DC | PRN
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
## 2022-10-18 ENCOUNTER — Inpatient Hospital Stay (HOSPITAL_COMMUNITY)
Admission: AD | Admit: 2022-10-18 | Discharge: 2022-10-18 | Disposition: A | Discharge status: Home / Self Care | Payer: PRIVATE HEALTH INSURANCE | Attending: Obstetrics & Gynecology | Admitting: Obstetrics & Gynecology

## 2022-10-18 ENCOUNTER — Encounter: Payer: Self-pay | Admitting: Obstetrics and Gynecology

## 2022-10-18 ENCOUNTER — Encounter (HOSPITAL_COMMUNITY): Payer: Self-pay | Admitting: Obstetrics & Gynecology

## 2022-10-18 DIAGNOSIS — M549 Dorsalgia, unspecified: Secondary | ICD-10-CM | POA: Diagnosis not present

## 2022-10-18 DIAGNOSIS — Z3A27 27 weeks gestation of pregnancy: Secondary | ICD-10-CM | POA: Diagnosis not present

## 2022-10-18 DIAGNOSIS — O10912 Unspecified pre-existing hypertension complicating pregnancy, second trimester: Secondary | ICD-10-CM | POA: Insufficient documentation

## 2022-10-18 DIAGNOSIS — O09522 Supervision of elderly multigravida, second trimester: Secondary | ICD-10-CM | POA: Insufficient documentation

## 2022-10-18 DIAGNOSIS — O99891 Other specified diseases and conditions complicating pregnancy: Secondary | ICD-10-CM

## 2022-10-18 DIAGNOSIS — O26892 Other specified pregnancy related conditions, second trimester: Secondary | ICD-10-CM | POA: Insufficient documentation

## 2022-10-18 DIAGNOSIS — Z79899 Other long term (current) drug therapy: Secondary | ICD-10-CM | POA: Diagnosis not present

## 2022-10-18 DIAGNOSIS — Z7982 Long term (current) use of aspirin: Secondary | ICD-10-CM | POA: Diagnosis not present

## 2022-10-18 DIAGNOSIS — M545 Low back pain, unspecified: Secondary | ICD-10-CM | POA: Diagnosis not present

## 2022-10-18 LAB — URINALYSIS, ROUTINE W REFLEX MICROSCOPIC
Bilirubin Urine: NEGATIVE
Glucose, UA: NEGATIVE mg/dL
Hgb urine dipstick: NEGATIVE
Ketones, ur: NEGATIVE mg/dL
Leukocytes,Ua: NEGATIVE
Nitrite: NEGATIVE
Protein, ur: NEGATIVE mg/dL
Specific Gravity, Urine: 1.019 (ref 1.005–1.030)
pH: 7 (ref 5.0–8.0)

## 2022-10-18 MED ORDER — LIDOCAINE 5 % EX PTCH
1.0000 | MEDICATED_PATCH | Freq: Once | CUTANEOUS | Status: DC
  Administered 2022-10-18: 1 via TRANSDERMAL
  Filled 2022-10-18: qty 1

## 2022-10-18 MED ORDER — LIDOCAINE 5 % EX PTCH
1.0000 | MEDICATED_PATCH | CUTANEOUS | Status: DC
  Filled 2022-10-18: qty 1

## 2022-10-18 MED ORDER — CYCLOBENZAPRINE HCL 5 MG PO TABS
10.0000 mg | ORAL_TABLET | Freq: Once | ORAL | Status: AC
  Administered 2022-10-18: 10 mg via ORAL
  Filled 2022-10-18: qty 2

## 2022-10-18 MED ORDER — LIDOCAINE 5 % EX PTCH: 1.0000 | MEDICATED_PATCH | Freq: Once | CUTANEOUS | Status: DC

## 2022-10-18 NOTE — Discharge Instructions (Signed)
I would strongly recommend you invest in a belly band and wear it on days where you are at work or standing for long periods of time. This will help support the weight of your growing uterus.

## 2022-10-18 NOTE — MAU Provider Note (Signed)
History     CSN: [id number]  Arrival date and time: 10/18/22 0436   Event Date/Time   First Provider Initiated Contact with Patient 10/18/22 [phone]      Chief Complaint  Patient presents with   Back Pain    Ms. Jacqueline Adalea Zborowski is a 42 y.o. year old [id number] female at [redacted]w[redacted]d weeks gestation who presents to MAU reporting lower back pain that radiates to her lower abdomen x 1 days. She reports pressure with urination but denies dysuria. She took 2 Tylenol at 0200 today. She has not taken flexeril today. 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 She notices her back pain is worse after these shifts. She work yesterday evening. She denies VB or LOF. Reports good fetal movement.    OB History     Gravida  10   Para  4   Term  2   Preterm  2   AB  5   Living  4      SAB  4   IAB  1   Ectopic  0   Multiple  0   Live Births  4           Past Medical History:  Diagnosis Date   Gallstone    Headache(784.0)    Heart murmur    Hypertension    Infection    UTI   Maternal chronic hypertension in first trimester 04/09/2011   Pregnancy induced hypertension    after 2nd delivery   Vaginal Pap smear, abnormal    cant remember    Past Surgical History:  Procedure Laterality Date   DILATION AND CURETTAGE OF UTERUS     dont remember year   LEEP      Family History  Problem Relation Age of Onset   Asthma Mother    Hypertension Mother    CAD Mother    Asthma Maternal Grandmother    Hypertension Maternal Grandmother     Social History   Tobacco Use   Smoking status: Never   Smokeless tobacco: Never  Vaping Use   Vaping Use: Never used  Substance Use Topics   Alcohol use: No    Alcohol/week: 0.0 standard drinks of alcohol   Drug use: No    Allergies: No Known Allergies  Medications Prior to Admission  Medication Sig Dispense Refill Last Dose   acetaminophen (TYLENOL) 500 MG tablet Take 1,000 mg by mouth every 6 (six) hours as needed.    10/18/2022   labetalol (NORMODYNE) 200 MG tablet Take 1 tablet (200 mg total) by mouth 2 (two) times daily. 60 tablet 3 10/17/2022   aspirin EC 81 MG tablet Take 1 tablet (81 mg total) by mouth daily. Take after 12 weeks for prevention of preeclampsia later in pregnancy 300 tablet 2 10/16/2022   Butalbital-APAP-Caffeine (FIORICET) 50-300-40 MG CAPS Take 1 capsule by mouth daily as needed (severe headache). 10 capsule 0 not taking   cyclobenzaprine (FLEXERIL) 10 MG tablet Take 1 tablet (10 mg total) by mouth 2 (two) times daily as needed for up to 10 doses for muscle spasms. (Patient not taking: Reported on 08/29/2022) 10 tablet 0    metoCLOPramide (REGLAN) 10 MG tablet Take 1 tablet (10 mg total) by mouth every 6 (six) hours as needed for nausea or vomiting. (Patient not taking: Reported on 09/26/2022) 30 tablet 0    NIFEdipine (PROCARDIA XL) 30 MG 24 hr tablet Take 1 tablet (30 mg total) by mouth daily. (  Patient not taking: Reported on 10/10/2022) 30 tablet 5    ondansetron (ZOFRAN-ODT) 4 MG disintegrating tablet Take 1 tablet (4 mg total) by mouth every 8 (eight) hours as needed for nausea or vomiting. 20 tablet 0 10/16/2022   prenatal vitamin w/FE, FA (PRENATAL 1 + 1) 27-1 MG TABS tablet Take 1 tablet by mouth daily at 12 noon. 30 tablet 11 10/16/2022    Review of Systems  Constitutional:  Positive for activity change. Negative for fever.  Gastrointestinal:  Positive for abdominal pain. Negative for constipation and diarrhea.  Genitourinary:  Negative for difficulty urinating and vaginal bleeding.  Musculoskeletal:  Positive for back pain.  Neurological:  Negative for headaches.  Psychiatric/Behavioral:  Positive for sleep disturbance.    Physical Exam   Blood pressure 124/76, pulse 79, temperature 98.5 F (36.9 C), temperature source Oral, resp. rate 18, height 5\' 6"  (1.676 m), weight 81.4 kg, last menstrual period 04/07/2022, currently breastfeeding. NST:  Baseline: 130 bpm, Variability: Good {> 6  bpm), Accelerations: Reactive, and Decelerations: Absent  Physical Exam Vitals and nursing note reviewed.  Constitutional:      General: She is not in acute distress.    Appearance: Normal appearance. She is not ill-appearing, toxic-appearing or diaphoretic.  HENT:     Head: Normocephalic.     Right Ear: External ear normal.     Left Ear: External ear normal.     Nose: Nose normal.  Eyes:     Extraocular Movements: Extraocular movements intact.     Conjunctiva/sclera: Conjunctivae normal.  Cardiovascular:     Rate and Rhythm: Normal rate and regular rhythm.     Heart sounds: Normal heart sounds.  Pulmonary:     Effort: Pulmonary effort is normal.     Breath sounds: Normal breath sounds.  Abdominal:     General: There is no distension.     Palpations: Abdomen is soft.     Tenderness: There is no abdominal tenderness. There is no right CVA tenderness, guarding or rebound.  Musculoskeletal:        General: No swelling or tenderness.     Right lower leg: No edema.     Left lower leg: No edema.     Comments: Tenderness over low back muscles. No skin changes.   Neurological:     Mental Status: She is alert and oriented to person, place, and time.  Psychiatric:        Mood and Affect: Mood normal.        Behavior: Behavior normal.     MAU Course  Procedures  MDM Results for orders placed or performed during the hospital encounter of 10/18/22 (from the past 24 hour(s))  Urinalysis, Routine w reflex microscopic -Urine, Clean Catch     Status: Abnormal   Collection Time: 10/18/22  5:01 AM  Result Value Ref Range   Color, Urine YELLOW YELLOW   APPearance CLOUDY (A) CLEAR   Specific Gravity, Urine 1.019 1.005 - 1.030   pH 7.0 5.0 - 8.0   Glucose, UA NEGATIVE NEGATIVE mg/dL   Hgb urine dipstick NEGATIVE NEGATIVE   Bilirubin Urine NEGATIVE NEGATIVE   Ketones, ur NEGATIVE NEGATIVE mg/dL   Protein, ur NEGATIVE NEGATIVE mg/dL   Nitrite NEGATIVE NEGATIVE   Leukocytes,Ua NEGATIVE  NEGATIVE   S/p flexeril and lidocaine patch states she is feeling better.    Assessment and Plan  Meshawn Narah Leverette is a 42 y.o. [id number] at [redacted]w[redacted]d who presents with 1 day of low back pain following  a 10 hour work shift. UA wnl and no dysuria. Improvement with flexeril and lidocaine patch. Discussed use of belly band to support growing uterus and improve back discomfort with standing for long periods of time.   1. Back pain in pregnancy -Continue flexeril, heating pad PRN -Obtain belly band  2. [redacted] weeks gestation of pregnancy Routine OB follow up   Future Appointments  Date Time Provider [place]  10/24/2022  7:15 AM [facility] NURSE [facility]  10/24/2022  7:30 AM [facility] US2 [facility] [place]  10/24/2022  9:00 AM [facility] LAB [facility]  10/24/2022 12:00 PM [facility]  10/24/2022  2:30 PM Anyanwu, Sallyanne Havers, MD [facility]  11/07/2022  2:30 PM Donnamae Jude, MD [facility]  11/21/2022  2:30 PM Anyanwu, Sallyanne Havers, MD [facility]  12/05/2022  2:30 PM Anyanwu, Sallyanne Havers, MD [facility]     Jamarkis Branam Autry[place]Lott 10/18/2022, 5:08 AM

## 2022-10-18 NOTE — MAU Note (Signed)
Pt says has back pain- started at 0200- took XS Tyl 2 tabs - no relief  Then started lower abd pressure  Has been voiding a lot- feels pressure  [employer] baby moving

## 2022-10-20 ENCOUNTER — Telehealth: Payer: Self-pay

## 2022-10-20 NOTE — Telephone Encounter (Signed)
Spoke with patient - can not move appointment, due to work schedule

## 2022-10-24 ENCOUNTER — Ambulatory Visit: Payer: PRIVATE HEALTH INSURANCE | Admitting: *Deleted

## 2022-10-24 ENCOUNTER — Ambulatory Visit (INDEPENDENT_AMBULATORY_CARE_PROVIDER_SITE_OTHER): Payer: Self-pay | Admitting: Clinical

## 2022-10-24 ENCOUNTER — Ambulatory Visit: Payer: PRIVATE HEALTH INSURANCE | Attending: Obstetrics

## 2022-10-24 ENCOUNTER — Encounter: Payer: Medicaid Other | Admitting: Obstetrics & Gynecology

## 2022-10-24 ENCOUNTER — Other Ambulatory Visit (INDEPENDENT_AMBULATORY_CARE_PROVIDER_SITE_OTHER): Payer: Medicaid Other

## 2022-10-24 ENCOUNTER — Other Ambulatory Visit: Payer: Self-pay

## 2022-10-24 ENCOUNTER — Other Ambulatory Visit: Payer: Self-pay | Admitting: *Deleted

## 2022-10-24 VITALS — BP 137/78 | HR 78

## 2022-10-24 DIAGNOSIS — O09523 Supervision of elderly multigravida, third trimester: Secondary | ICD-10-CM | POA: Diagnosis not present

## 2022-10-24 DIAGNOSIS — O0990 Supervision of high risk pregnancy, unspecified, unspecified trimester: Secondary | ICD-10-CM | POA: Insufficient documentation

## 2022-10-24 DIAGNOSIS — O10919 Unspecified pre-existing hypertension complicating pregnancy, unspecified trimester: Secondary | ICD-10-CM | POA: Diagnosis present

## 2022-10-24 DIAGNOSIS — O09213 Supervision of pregnancy with history of pre-term labor, third trimester: Secondary | ICD-10-CM

## 2022-10-24 DIAGNOSIS — Z23 Encounter for immunization: Secondary | ICD-10-CM

## 2022-10-24 DIAGNOSIS — E038 Other specified hypothyroidism: Secondary | ICD-10-CM | POA: Diagnosis not present

## 2022-10-24 DIAGNOSIS — O99283 Endocrine, nutritional and metabolic diseases complicating pregnancy, third trimester: Secondary | ICD-10-CM

## 2022-10-24 DIAGNOSIS — O10913 Unspecified pre-existing hypertension complicating pregnancy, third trimester: Secondary | ICD-10-CM

## 2022-10-24 DIAGNOSIS — O10013 Pre-existing essential hypertension complicating pregnancy, third trimester: Secondary | ICD-10-CM

## 2022-10-24 DIAGNOSIS — Z3A28 28 weeks gestation of pregnancy: Secondary | ICD-10-CM

## 2022-10-24 DIAGNOSIS — F4323 Adjustment disorder with mixed anxiety and depressed mood: Secondary | ICD-10-CM

## 2022-10-24 DIAGNOSIS — O09293 Supervision of pregnancy with other poor reproductive or obstetric history, third trimester: Secondary | ICD-10-CM

## 2022-10-24 DIAGNOSIS — O09522 Supervision of elderly multigravida, second trimester: Secondary | ICD-10-CM | POA: Diagnosis not present

## 2022-10-24 DIAGNOSIS — Z658 Other specified problems related to psychosocial circumstances: Secondary | ICD-10-CM

## 2022-10-24 NOTE — Progress Notes (Signed)
Pt seen this morning for 2hr GTT  Received T-Dap   B/:141/87 P:80  Wt : 177 lb.   Pt noted not taking B/P Rx Today.

## 2022-10-24 NOTE — Patient Instructions (Signed)
[facility] [address] [place], [place] [address] [phone] (main office) [phone] (Takasha Vetere's office)  Housing Resources                    [facility] (serves [place], [place], [place], [place], [place], [place], [place], [place], [place], [place], [place], [place], and [place] counties) [address], [place], [place] [address] [phone] www.ptrc.org  **Rental assistance, Home Rehabilitation,Weatherization Assistance Program, Heating Appliance Repair and Replacement Program, Housing Voucher Program   [facility] [place]  [facility] [place] [address], [place], [place] [address] [phone]) [phone] www.gha-[place].org   [facility] [address], [place], [place] [address] [phone]) [phone] www.gsohc.org **Programs include: Foreclosure Prevention and Housing Counseling, Healthy Homes/Tenant Advocacy, Homeless Prevention and Housing Assistance  [employer] [facility] [address], [place], [place] [address] (336) 641-3383 www.[place].gov **housing applications/recertification; tax payment relief/exemption under specific qualifications  [facility] [address] [place], [place], [place] [address] www.online[place].com/~maryshouse **transitional housing for women in recovery who have minor children or are pregnant  [facility] [address], [place], [place] [address] www.ywcagsonc.org  **emergency shelter and support services for families facing homelessness  [facility] [address], [place], [place] [address] [phone]) [phone] www.youthfocus.org **transitional housing to pregnant women; emergency housing for youth who have run away, are experiencing a family crisis, are victims of abuse or neglect, or are homeless  [facility] [address], [place], [place]  [address] [phone]) [phone] ircgso.org **Drop-in center for people experiencing homelessness; overnight warming center when temperature is 25 degrees or below  [facility] [employer] [address], [place], [place] [address] [phone]) [phone] https://reentrystaffingagency.org/ **help with affordable housing to people experiencing homelessness or unemployment due to incarceration  [facility] [address], [place], [place] [address] (336) [phone] www.greensborourbanministry.org  **emergency and transitional housing, rent/mortgage assistance, utility assistance  [facility] [address], [place], [place] [address] [phone]) [phone] www.salvationarmyof[place].org **emergency and transitional housing  [employer] [facility] [address], [place], [place] [address] [phone]) [phone] Www.habitat[place].org   [facility] [address], [place], [place] [address] (336) [phone] https://chshousing.org **Home Ownership/Affordable Housing Program and Home Repair Program  [facility] [employer] [address], [place], [place] [address] [phone]) [phone] www.housingconsultantsgroup.org **home buyer education courses, foreclosure prevention  [place] [employer] [address], [place], [place] [address] [phone]) [phone] http://eh.[place].gov **Environmental Exposure Assessment (investigation of homes where either children or pregnant women with a confirmed elevated blood lead level reside)  [place] [facility] [address], [place], [place] [address] [phone]) [phone] www.ncdhhs.gov/divisions/dvrs **Home Expense Assistance/Repairs Program; offers home accessibility updates, such as ramps or bars in the bathroom  [facility] [address], , [place] [address] (336)  [phone] https://www.self-help.org/locations/-branch **Offers credit-building and banking services to people unable to use traditional banking         

## 2022-10-25 LAB — CBC
Hematocrit: 38.2 % (ref 34.0–46.6)
Hemoglobin: 12.2 g/dL (ref 11.1–15.9)
MCH: 28.4 pg (ref 26.6–33.0)
MCHC: 31.9 g/dL (ref 31.5–35.7)
MCV: 89 fL (ref 79–97)
Platelets: 245 10*3/uL (ref 150–450)
RBC: 4.3 x10E6/uL (ref 3.77–5.28)
RDW: 13.1 % (ref 11.7–15.4)
WBC: 11.5 10*3/uL — ABNORMAL HIGH (ref 3.4–10.8)

## 2022-10-25 LAB — GLUCOSE TOLERANCE, 2 HOURS W/ 1HR
Glucose, 1 hour: 155 mg/dL (ref 70–179)
Glucose, 2 hour: 112 mg/dL (ref 70–152)
Glucose, Fasting: 72 mg/dL (ref 70–91)

## 2022-10-25 LAB — RPR: RPR Ser Ql: NONREACTIVE

## 2022-10-25 LAB — HIV ANTIBODY (ROUTINE TESTING W REFLEX): HIV Screen 4th Generation wRfx: NONREACTIVE

## 2022-11-02 ENCOUNTER — Encounter: Payer: Self-pay | Admitting: Obstetrics and Gynecology

## 2022-11-03 ENCOUNTER — Other Ambulatory Visit: Payer: Self-pay

## 2022-11-03 ENCOUNTER — Inpatient Hospital Stay (HOSPITAL_COMMUNITY)
Admission: AD | Admit: 2022-11-03 | Discharge: 2022-11-03 | Disposition: A | Discharge status: Home / Self Care | Payer: PRIVATE HEALTH INSURANCE | Attending: Obstetrics and Gynecology | Admitting: Obstetrics and Gynecology

## 2022-11-03 ENCOUNTER — Encounter (HOSPITAL_COMMUNITY): Payer: Self-pay | Admitting: Obstetrics and Gynecology

## 2022-11-03 DIAGNOSIS — K529 Noninfective gastroenteritis and colitis, unspecified: Secondary | ICD-10-CM | POA: Diagnosis not present

## 2022-11-03 DIAGNOSIS — O99613 Diseases of the digestive system complicating pregnancy, third trimester: Secondary | ICD-10-CM | POA: Insufficient documentation

## 2022-11-03 DIAGNOSIS — R109 Unspecified abdominal pain: Secondary | ICD-10-CM | POA: Diagnosis present

## 2022-11-03 DIAGNOSIS — Z3A30 30 weeks gestation of pregnancy: Secondary | ICD-10-CM | POA: Diagnosis not present

## 2022-11-03 LAB — URINALYSIS, ROUTINE W REFLEX MICROSCOPIC
Bilirubin Urine: NEGATIVE
Glucose, UA: NEGATIVE mg/dL
Hgb urine dipstick: NEGATIVE
Ketones, ur: 20 mg/dL — AB
Leukocytes,Ua: NEGATIVE
Nitrite: NEGATIVE
Protein, ur: NEGATIVE mg/dL
Specific Gravity, Urine: 1.016 (ref 1.005–1.030)
pH: 6 (ref 5.0–8.0)

## 2022-11-03 MED ORDER — ONDANSETRON 4 MG PO TBDP
8.0000 mg | ORAL_TABLET | Freq: Once | ORAL | Status: AC
  Administered 2022-11-03: 8 mg via ORAL
  Filled 2022-11-03: qty 2

## 2022-11-03 MED ORDER — DICYCLOMINE HCL 10 MG PO CAPS
20.0000 mg | ORAL_CAPSULE | Freq: Once | ORAL | Status: AC
  Administered 2022-11-03: 20 mg via ORAL
  Filled 2022-11-03: qty 2

## 2022-11-03 MED ORDER — ONDANSETRON 4 MG PO TBDP: 4.0000 mg | ORAL_TABLET | Freq: Three times a day (TID) | ORAL | 0 refills | Status: DC | PRN

## 2022-11-03 NOTE — Discharge Instructions (Signed)
Return to care  If you have heavier bleeding that soaks through more than 2 pads per hour for an hour or more If you bleed so much that you feel like you might pass out or you do pass out If you have significant abdominal pain that is not improved with Tylenol   

## 2022-11-03 NOTE — MAU Note (Signed)
Brittany [name] is a 42 y.o. at [redacted]w[redacted]d here in MAU reporting: lower abdominal cramping and pelvic pressure that began this morning but has worsened.  Denies VB or LOF.  Endorses +FM.  Reports last intercourse yesterday. LMP: NA Onset of complaint: today Pain score: 8 Vitals:   11/03/22 1636  BP: 119/81  Pulse: 83  Resp: 18  Temp: 99 F (37.2 C)  SpO2: 99%     FHT:144 bpm Lab orders placed from triage:   UA

## 2022-11-03 NOTE — MAU Provider Note (Signed)
History     [id number]  Arrival date and time: 11/03/22 1554    Chief Complaint  Patient presents with   Cramping   Pelvic Pressure     HPI Brittany [name] is a 42 y.o. [id number] at [redacted]w[redacted]d by LMP who presents for abdominal cramping. Symptoms started this morning. Reports nausea & vomiting. Had episode of diarrhea this morning. Denies fever, dysuria, vaginal bleeding, or LOF. Reports good fetal movement.   O/Positive/-- (12/18 1538)  OB History     Gravida  10   Para  4   Term  2   Preterm  2   AB  5   Living  4      SAB  4   IAB  1   Ectopic  0   Multiple  0   Live Births  4           Past Medical History:  Diagnosis Date   Gallstone    Headache(784.0)    Heart murmur    Hypertension    Infection    UTI   Maternal chronic hypertension in first trimester 04/09/2011   Pregnancy induced hypertension    after 2nd delivery   Vaginal Pap smear, abnormal    cant remember    Past Surgical History:  Procedure Laterality Date   DILATION AND CURETTAGE OF UTERUS     dont remember year   LEEP      Family History  Problem Relation Age of Onset   Asthma Mother    Hypertension Mother    CAD Mother    Asthma Maternal Grandmother    Hypertension Maternal Grandmother     Social History   Socioeconomic History   Marital status: Married    Spouse name: Not on file   Number of children: Not on file   Years of education: Not on file   Highest education level: Not on file  Occupational History   Not on file  Tobacco Use   Smoking status: Never   Smokeless tobacco: Never  Vaping Use   Vaping Use: Never used  Substance and Sexual Activity   Alcohol use: No    Alcohol/week: 0.0 standard drinks of alcohol   Drug use: No   Sexual activity: Yes  Other Topics Concern   Not on file  Social History Narrative   Not on file   Social Determinants of Health   Financial Resource Strain: Not on file  Food Insecurity: Not on file  Transportation  Needs: Not on file  Physical Activity: Not on file  Stress: Not on file  Social Connections: Not on file  Intimate Partner Violence: Not on file    No Known Allergies  No current facility-administered medications on file prior to encounter.   Current Outpatient Medications on File Prior to Encounter  Medication Sig Dispense Refill   labetalol (NORMODYNE) 200 MG tablet Take 1 tablet (200 mg total) by mouth 2 (two) times daily. 60 tablet 3   acetaminophen (TYLENOL) 500 MG tablet Take 1,000 mg by mouth every 6 (six) hours as needed.     aspirin EC 81 MG tablet Take 1 tablet (81 mg total) by mouth daily. Take after 12 weeks for prevention of preeclampsia later in pregnancy 300 tablet 2   Butalbital-APAP-Caffeine (FIORICET) 50-300-40 MG CAPS Take 1 capsule by mouth daily as needed (severe headache). (Patient not taking: Reported on 10/24/2022) 10 capsule 0   cyclobenzaprine (FLEXERIL) 10 MG tablet Take 1 tablet (10 mg total) by  mouth 2 (two) times daily as needed for up to 10 doses for muscle spasms. (Patient not taking: Reported on 08/29/2022) 10 tablet 0   metoCLOPramide (REGLAN) 10 MG tablet Take 1 tablet (10 mg total) by mouth every 6 (six) hours as needed for nausea or vomiting. (Patient not taking: Reported on 09/26/2022) 30 tablet 0   NIFEdipine (PROCARDIA XL) 30 MG 24 hr tablet Take 1 tablet (30 mg total) by mouth daily. (Patient not taking: Reported on 10/10/2022) 30 tablet 5   prenatal vitamin w/FE, FA (PRENATAL 1 + 1) 27-1 MG TABS tablet Take 1 tablet by mouth daily at 12 noon. (Patient not taking: Reported on 10/24/2022) 30 tablet 11   [DISCONTINUED] cetirizine (ZYRTEC ALLERGY) 10 MG tablet Take 1 tablet (10 mg total) by mouth daily. 30 tablet 2   [DISCONTINUED] ipratropium (ATROVENT) 0.06 % nasal spray Place 2 sprays into both nostrils 4 (four) times daily. 15 mL 0     ROS Pertinent positives and negative per HPI, all others reviewed and negative  Physical Exam   BP 134/83   Pulse 76    Temp 99 F (37.2 C) (Oral)   Resp 18   Ht  (1.651 m)   Wt 80.3 kg   LMP 04/07/2022 (Exact Date)   SpO2 100%   BMI 29.47 kg/m   No data found.   Physical Exam Vitals and nursing note reviewed. Exam conducted with a chaperone present.  Constitutional:      General: She is not in acute distress.    Appearance: Normal appearance. She is not ill-appearing.  HENT:     Head: Normocephalic and atraumatic.  Eyes:     General: No scleral icterus.    Pupils: Pupils are equal, round, and reactive to light.  Pulmonary:     Effort: Pulmonary effort is normal. No respiratory distress.  Abdominal:     Tenderness: There is no abdominal tenderness.     Comments: gravid  Skin:    General: Skin is warm and dry.  Neurological:     Mental Status: She is alert.      Cervical Exam Dilation: Closed Effacement (%): Thick Cervical Position: Posterior Station: Ballotable Exam by:: Estanislado Spire, NP    FHT Baseline 145, moderate variability, 15x15 accels, no decels Toco: none Cat: 1  Labs No results found for this or any previous visit (from the past 24 hour(s)).  Imaging No results found.  MAU Course  Procedures Lab Orders         Urinalysis, Routine w reflex microscopic -Urine, Clean Catch    Meds ordered this encounter  Medications   ondansetron (ZOFRAN-ODT) disintegrating tablet 8 mg   dicyclomine (BENTYL) capsule 20 mg   ondansetron (ZOFRAN-ODT) 4 MG disintegrating tablet    Sig: Take 1 tablet (4 mg total) by mouth every 8 (eight) hours as needed for nausea or vomiting.    Dispense:  20 tablet    Refill:  0    Order Specific Question:   Supervising Provider    Answer:   Warden Fillers [[id number]]   Imaging Orders  No imaging studies ordered today    MDM moderate  Assessment and Plan   1. Gastroenteritis presumed infectious   2. [redacted] weeks gestation of pregnancy    -Cervix closed and no contractions on toco. Suspect patient developing GI illness due to  cramping, vomiting, & diarrhea. Treated in MAU with zofran & bentyl. Reports some improvement in symptoms. Will prescribe zofran. Discussed using imodium  as needed & staying hydrated.   #FWB: cat 1 tracing    Dispo: discharged to home in stable condition.   Discharge Instructions     Discharge patient   Complete by: As directed    Discharge disposition: 01-Home or Self Care   Discharge patient date: 11/03/2022       Judeth Horn, NP 11/04/22 8:45 PM  Allergies as of 11/03/2022   No Known Allergies      Medication List     TAKE these medications    acetaminophen 500 MG tablet Commonly known as: TYLENOL Take 1,000 mg by mouth every 6 (six) hours as needed.   aspirin EC 81 MG tablet Take 1 tablet (81 mg total) by mouth daily. Take after 12 weeks for prevention of preeclampsia later in pregnancy   Butalbital-APAP-Caffeine 50-300-40 MG Caps Commonly known as: Fioricet Take 1 capsule by mouth daily as needed (severe headache).   cyclobenzaprine 10 MG tablet Commonly known as: FLEXERIL Take 1 tablet (10 mg total) by mouth 2 (two) times daily as needed for up to 10 doses for muscle spasms.   labetalol 200 MG tablet Commonly known as: NORMODYNE Take 1 tablet (200 mg total) by mouth 2 (two) times daily.   metoCLOPramide 10 MG tablet Commonly known as: REGLAN Take 1 tablet (10 mg total) by mouth every 6 (six) hours as needed for nausea or vomiting.   NIFEdipine 30 MG 24 hr tablet Commonly known as: Procardia XL Take 1 tablet (30 mg total) by mouth daily.   ondansetron 4 MG disintegrating tablet Commonly known as: ZOFRAN-ODT Take 1 tablet (4 mg total) by mouth every 8 (eight) hours as needed for nausea or vomiting.   prenatal vitamin w/FE, FA 27-1 MG Tabs tablet Take 1 tablet by mouth daily at 12 noon.

## 2022-11-07 ENCOUNTER — Ambulatory Visit (INDEPENDENT_AMBULATORY_CARE_PROVIDER_SITE_OTHER): Payer: Medicaid Other | Admitting: Family Medicine

## 2022-11-07 VITALS — BP 136/88 | HR 75 | Wt 178.0 lb

## 2022-11-07 DIAGNOSIS — O0990 Supervision of high risk pregnancy, unspecified, unspecified trimester: Secondary | ICD-10-CM

## 2022-11-07 DIAGNOSIS — G43719 Chronic migraine without aura, intractable, without status migrainosus: Secondary | ICD-10-CM

## 2022-11-07 DIAGNOSIS — E0590 Thyrotoxicosis, unspecified without thyrotoxic crisis or storm: Secondary | ICD-10-CM

## 2022-11-07 DIAGNOSIS — O10919 Unspecified pre-existing hypertension complicating pregnancy, unspecified trimester: Secondary | ICD-10-CM

## 2022-11-07 DIAGNOSIS — O09299 Supervision of pregnancy with other poor reproductive or obstetric history, unspecified trimester: Secondary | ICD-10-CM

## 2022-11-07 DIAGNOSIS — O09522 Supervision of elderly multigravida, second trimester: Secondary | ICD-10-CM

## 2022-11-07 NOTE — Progress Notes (Signed)
    PRENATAL VISIT NOTE  Subjective:  Brittany [name] is a 42 y.o. [id number] at [redacted]w[redacted]d being seen today for ongoing prenatal care.  She is currently monitored for the following issues for this high-risk pregnancy and has Chronic hypertension affecting pregnancy; Subclinical hyperthyroidism; Migraine; Murmur, cardiac; Anemia; Vitamin D deficiency; Supervision of high risk pregnancy, antepartum; Hx of preeclampsia, prior pregnancy, currently pregnant; and AMA (advanced maternal age) multigravida 35+, second trimester on their problem list.  Patient reports  does not feel like herself. Reports headache .  Contractions: Irritability. Vag. Bleeding: None.  Movement: Present. Denies leaking of fluid.   The following portions of the patient's history were reviewed and updated as appropriate: allergies, current medications, past family history, past medical history, past social history, past surgical history and problem list.   Objective:   Vitals:   11/07/22 1435 11/07/22 1456  BP: (!) 158/95 136/88  Pulse: 81 75  Weight: 178 lb (80.7 kg)     Fetal Status: Fetal Heart Rate (bpm): 138 Fundal Height: 30 cm Movement: Present     General:  Alert, oriented and cooperative. Patient is in no acute distress.  Skin: Skin is warm and dry. No rash noted.   Cardiovascular: Normal heart rate noted  Respiratory: Normal respiratory effort, no problems with respiration noted  Abdomen: Soft, gravid, appropriate for gestational age.  Pain/Pressure: Present     Pelvic: Cervical exam deferred        Extremities: Normal range of motion.  Edema: None  Mental Status: Normal mood and affect. Normal behavior. Normal judgment and thought content.   Assessment and Plan:  Pregnancy: [name] at [redacted]w[redacted]d 1. Chronic hypertension affecting pregnancy Labs today. On Procardia but did not take today Check labs and urine today - CBC - Protein / creatinine ratio, urine - Comprehensive metabolic panel - TSH  2. Intractable  chronic migraine without aura and without status migrainosus Has flexeril and Fioricet at home, offered MAU, but will try at home care--warning signs reviewed.  3. Subclinical hyperthyroidism Check TSH today  4. Supervision of high risk pregnancy, antepartum   5. Hx of preeclampsia, prior pregnancy, currently pregnant On ASA, Growth WNL  6. AMA (advanced maternal age) multigravida 35+, second trimester LR NIPT  Preterm labor symptoms and general obstetric precautions including but not limited to vaginal bleeding, contractions, leaking of fluid and fetal movement were reviewed in detail with the patient. Please refer to After Visit Summary for other counseling recommendations.   Return in 2 weeks (on 11/21/2022).  Future Appointments  Date Time Provider Department Center  11/21/2022  7:15 AM [facility] NURSE [facility]  11/21/2022  7:30 AM [facility] US3 [facility]  11/21/2022  2:30 PM Anyanwu, Jethro Bastos, MD [facility]  11/28/2022  7:15 AM [facility] NURSE [facility]  11/28/2022  7:30 AM [facility] US3 [facility]  12/05/2022  7:15 AM [facility] NURSE [facility]  12/05/2022  7:30 AM [facility] US2 [facility]  12/05/2022  2:30 PM Anyanwu, Jethro Bastos, MD [facility]  12/19/2022  7:45 AM [facility] NURSE [facility]  12/19/2022  8:00 AM [facility] US1 [facility]  12/19/2022  2:30 PM [place] Bing, MD [facility]  12/26/2022  2:30 PM [place] Bing, MD [facility]  01/02/2023  2:30 PM Reva Bores, MD [facility]  01/09/2023  2:30 PM Anyanwu, Jethro Bastos, MD [facility]    Reva Bores, MD

## 2022-11-07 NOTE — Progress Notes (Signed)
ROB   CC: Cramping, HA's pt states she does not feel her self today and feels as if something is wrong.  Note taking B/P yesterday but not today.

## 2022-11-08 ENCOUNTER — Encounter: Payer: Self-pay | Admitting: Obstetrics and Gynecology

## 2022-11-08 LAB — TSH: TSH: 0.789 u[IU]/mL (ref 0.450–4.500)

## 2022-11-08 LAB — COMPREHENSIVE METABOLIC PANEL
ALT: 19 IU/L (ref 0–32)
AST: 22 IU/L (ref 0–40)
Albumin/Globulin Ratio: 1.1 — ABNORMAL LOW (ref 1.2–2.2)
Albumin: 3.3 g/dL — ABNORMAL LOW (ref 3.9–4.9)
Alkaline Phosphatase: 93 IU/L (ref 44–121)
BUN/Creatinine Ratio: 5 — ABNORMAL LOW (ref 9–23)
BUN: 3 mg/dL — ABNORMAL LOW (ref 6–20)
Bilirubin Total: 0.2 mg/dL (ref 0.0–1.2)
CO2: 18 mmol/L — ABNORMAL LOW (ref 20–29)
Calcium: 8.5 mg/dL — ABNORMAL LOW (ref 8.7–10.2)
Chloride: 103 mmol/L (ref 96–106)
Creatinine, Ser: 0.59 mg/dL (ref 0.57–1.00)
Globulin, Total: 3 g/dL (ref 1.5–4.5)
Glucose: 85 mg/dL (ref 70–99)
Potassium: 3.5 mmol/L (ref 3.5–5.2)
Sodium: 135 mmol/L (ref 134–144)
Total Protein: 6.3 g/dL (ref 6.0–8.5)
eGFR: 117 mL/min/{1.73_m2} (ref >59)

## 2022-11-08 LAB — CBC
Hematocrit: 33.2 % — ABNORMAL LOW (ref 34.0–46.6)
Hemoglobin: 11.2 g/dL (ref 11.1–15.9)
MCH: 28.4 pg (ref 26.6–33.0)
MCHC: 33.7 g/dL (ref 31.5–35.7)
MCV: 84 fL (ref 79–97)
Platelets: 235 10*3/uL (ref 150–450)
RBC: 3.95 x10E6/uL (ref 3.77–5.28)
RDW: 13 % (ref 11.7–15.4)
WBC: 11.8 10*3/uL — ABNORMAL HIGH (ref 3.4–10.8)

## 2022-11-08 LAB — PROTEIN / CREATININE RATIO, URINE
Creatinine, Urine: 129.7 mg/dL
Protein, Ur: 19.2 mg/dL
Protein/Creat Ratio: 148 mg/g creat (ref 0–200)

## 2022-11-08 NOTE — Progress Notes (Cosign Needed Addendum)
Lab Only   Tdap

## 2022-11-20 ENCOUNTER — Encounter (HOSPITAL_COMMUNITY): Payer: Self-pay | Admitting: Obstetrics and Gynecology

## 2022-11-20 ENCOUNTER — Inpatient Hospital Stay (HOSPITAL_COMMUNITY)
Admission: AD | Admit: 2022-11-20 | Discharge: 2022-11-20 | Disposition: A | Discharge status: Home / Self Care | Payer: PRIVATE HEALTH INSURANCE | Attending: Obstetrics and Gynecology | Admitting: Obstetrics and Gynecology

## 2022-11-20 DIAGNOSIS — O10913 Unspecified pre-existing hypertension complicating pregnancy, third trimester: Secondary | ICD-10-CM | POA: Insufficient documentation

## 2022-11-20 DIAGNOSIS — Z3A32 32 weeks gestation of pregnancy: Secondary | ICD-10-CM | POA: Diagnosis not present

## 2022-11-20 DIAGNOSIS — R519 Headache, unspecified: Secondary | ICD-10-CM | POA: Diagnosis not present

## 2022-11-20 DIAGNOSIS — O0990 Supervision of high risk pregnancy, unspecified, unspecified trimester: Secondary | ICD-10-CM

## 2022-11-20 DIAGNOSIS — O26893 Other specified pregnancy related conditions, third trimester: Secondary | ICD-10-CM | POA: Diagnosis not present

## 2022-11-20 DIAGNOSIS — O09523 Supervision of elderly multigravida, third trimester: Secondary | ICD-10-CM | POA: Diagnosis not present

## 2022-11-20 LAB — CBC WITH DIFFERENTIAL/PLATELET
Abs Immature Granulocytes: 0.09 10*3/uL — ABNORMAL HIGH (ref 0.00–0.07)
Basophils Absolute: 0.1 10*3/uL (ref 0.0–0.1)
Basophils Relative: 1 %
Eosinophils Absolute: 0.1 10*3/uL (ref 0.0–0.5)
Eosinophils Relative: 1 %
HCT: 33.6 % — ABNORMAL LOW (ref 36.0–46.0)
Hemoglobin: 11.4 g/dL — ABNORMAL LOW (ref 12.0–15.0)
Immature Granulocytes: 1 %
Lymphocytes Relative: 18 %
Lymphs Abs: 1.8 10*3/uL (ref 0.7–4.0)
MCH: 28.1 pg (ref 26.0–34.0)
MCHC: 33.9 g/dL (ref 30.0–36.0)
MCV: 82.8 fL (ref 80.0–100.0)
Monocytes Absolute: 0.8 10*3/uL (ref 0.1–1.0)
Monocytes Relative: 8 %
Neutro Abs: 7 10*3/uL (ref 1.7–7.7)
Neutrophils Relative %: 71 %
Platelets: 225 10*3/uL (ref 150–400)
RBC: 4.06 MIL/uL (ref 3.87–5.11)
RDW: 13.6 % (ref 11.5–15.5)
WBC: 9.9 10*3/uL (ref 4.0–10.5)
nRBC: 0 % (ref 0.0–0.2)

## 2022-11-20 LAB — COMPREHENSIVE METABOLIC PANEL
ALT: 24 U/L (ref 0–44)
AST: 27 U/L (ref 15–41)
Albumin: 2.6 g/dL — ABNORMAL LOW (ref 3.5–5.0)
Alkaline Phosphatase: 89 U/L (ref 38–126)
Anion gap: 10 (ref 5–15)
BUN: <5 mg/dL — ABNORMAL LOW (ref 6–20)
CO2: 21 mmol/L — ABNORMAL LOW (ref 22–32)
Calcium: 8.5 mg/dL — ABNORMAL LOW (ref 8.9–10.3)
Chloride: 102 mmol/L (ref 98–111)
Creatinine, Ser: 0.65 mg/dL (ref 0.44–1.00)
GFR, Estimated: >60 mL/min (ref >60)
Glucose, Bld: 80 mg/dL (ref 70–99)
Potassium: 3.1 mmol/L — ABNORMAL LOW (ref 3.5–5.1)
Sodium: 133 mmol/L — ABNORMAL LOW (ref 135–145)
Total Bilirubin: 0.6 mg/dL (ref 0.3–1.2)
Total Protein: 6.5 g/dL (ref 6.5–8.1)

## 2022-11-20 LAB — PROTEIN / CREATININE RATIO, URINE
Creatinine, Urine: 108 mg/dL
Protein Creatinine Ratio: 0.14 mg/mg{Cre} (ref 0.00–0.15)
Total Protein, Urine: 15 mg/dL

## 2022-11-20 LAB — URINALYSIS, ROUTINE W REFLEX MICROSCOPIC
Bilirubin Urine: NEGATIVE
Glucose, UA: NEGATIVE mg/dL
Hgb urine dipstick: NEGATIVE
Ketones, ur: 5 mg/dL — AB
Leukocytes,Ua: NEGATIVE
Nitrite: NEGATIVE
Protein, ur: NEGATIVE mg/dL
Specific Gravity, Urine: 1.013 (ref 1.005–1.030)
pH: 7 (ref 5.0–8.0)

## 2022-11-20 MED ORDER — BUTALBITAL-APAP-CAFFEINE 50-325-40 MG PO TABS: 2.0000 | ORAL_TABLET | Freq: Once | ORAL | Status: DC

## 2022-11-20 MED ORDER — METOCLOPRAMIDE HCL 10 MG PO TABS
10.0000 mg | ORAL_TABLET | Freq: Once | ORAL | Status: AC
  Administered 2022-11-20: 10 mg via ORAL
  Filled 2022-11-20: qty 1

## 2022-11-20 MED ORDER — ACETAMINOPHEN-CAFFEINE 500-65 MG PO TABS
1.0000 | ORAL_TABLET | Freq: Once | ORAL | Status: AC
  Administered 2022-11-20: 1 via ORAL
  Filled 2022-11-20: qty 1

## 2022-11-20 MED ORDER — MAGNESIUM OXIDE -MG SUPPLEMENT 400 (240 MG) MG PO TABS
400.0000 mg | ORAL_TABLET | Freq: Once | ORAL | Status: AC
  Administered 2022-11-20: 400 mg via ORAL
  Filled 2022-11-20: qty 1

## 2022-11-20 MED ORDER — BUTALBITAL-APAP-CAFFEINE 50-325-40 MG PO TABS
1.0000 | ORAL_TABLET | Freq: Once | ORAL | Status: AC
  Administered 2022-11-20: 1 via ORAL
  Filled 2022-11-20: qty 1

## 2022-11-20 NOTE — MAU Note (Addendum)
...  Brittany [name] is a 42 y.o. at [redacted]w[redacted]d here in MAU reporting: Anterior HA, lower back pain, pelvic pressure, and nausea. She reports the back pain has been ongoing during her pregnancy but worse this morning. Last worked yesterday. She is also endorsing pelvic pressure that began around midnight last night. She reports urinary frequency. Denies burning with urinary and strong smelling urine. Denies vaginal discharge, vaginal itching, and vaginal odors. Denies VB or LOF. +FM.   Took 1000 mg of Tylenol last night prior to midnight - did not work. Has not taken her Flexeril or Fioricet. She reports she ran out. Last took her Labetalol last night at 2200. Has not taken it this morning.  Hx of migraines (sensitive to light and sound) and Pre-E. CHTN.  Pain score:  8/10 HA 8/10 back 8/10 pelvis  FHT: initial external Lab orders placed from triage:  UA

## 2022-11-20 NOTE — MAU Provider Note (Signed)
History     CSN: [id number]  Arrival date and time: 11/20/22 1914   Event Date/Time   First Provider Initiated Contact with Patient 11/20/22 0935      Chief Complaint  Patient presents with   Headache   Back Pain   Nausea   Pelvic Pain   Brittany [name] , a  42 y.o. [id number] at [redacted]w[redacted]d presents to MAU with complaints of and ongoing headaches since yesterday. She reports an frontal headache that is constant with pressure behind her eyes. She states she attempted to relieve symptoms with 1000mg  of Tylenol last night at midnight without relief. She states her headache was so bad this morning that "she couldn't get out of bed." Currently rating her headache a 8/10. She denies worsening or alleviating symptoms. She endorses a history of migraines but denies medication for them. She was previously prescribed Fioricet and Flexeril but denies taking them She endorses "seeing spots" last night but none at this time. She denies epigastric pain, nausea, vomiting and new onset swelling. She denies abnormal vaginal discharge. She endorses pressure with urination, but no vaginal bleeding, leaking of fluid or contractions. She endorses positive fetal movement.   Diet recall since yesterday: Nothing         OB History     Gravida  10   Para  4   Term  2   Preterm  2   AB  5   Living  4      SAB  4   IAB  1   Ectopic  0   Multiple  0   Live Births  4           Past Medical History:  Diagnosis Date   Gallstone    Headache(784.0)    Heart murmur    Hypertension    Infection    UTI   Maternal chronic hypertension in first trimester 04/09/2011   Pregnancy induced hypertension    after 2nd delivery   Vaginal Pap smear, abnormal    cant remember    Past Surgical History:  Procedure Laterality Date   DILATION AND CURETTAGE OF UTERUS     dont remember year   LEEP      Family History  Problem Relation Age of Onset   Asthma Mother    Hypertension Mother    CAD  Mother    Asthma Maternal Grandmother    Hypertension Maternal Grandmother     Social History   Tobacco Use   Smoking status: Never   Smokeless tobacco: Never  Vaping Use   Vaping Use: Never used  Substance Use Topics   Alcohol use: No    Alcohol/week: 0.0 standard drinks of alcohol   Drug use: No    Allergies: No Known Allergies  Medications Prior to Admission  Medication Sig Dispense Refill Last Dose   acetaminophen (TYLENOL) 500 MG tablet Take 1,000 mg by mouth every 6 (six) hours as needed. (Patient not taking: Reported on 11/07/2022)      aspirin EC 81 MG tablet Take 1 tablet (81 mg total) by mouth daily. Take after 12 weeks for prevention of preeclampsia later in pregnancy 300 tablet 2    Butalbital-APAP-Caffeine (FIORICET) 50-300-40 MG CAPS Take 1 capsule by mouth daily as needed (severe headache). (Patient not taking: Reported on 10/24/2022) 10 capsule 0    cyclobenzaprine (FLEXERIL) 10 MG tablet Take 1 tablet (10 mg total) by mouth 2 (two) times daily as needed for up to 10 doses  for muscle spasms. (Patient not taking: Reported on 08/29/2022) 10 tablet 0    labetalol (NORMODYNE) 200 MG tablet Take 1 tablet (200 mg total) by mouth 2 (two) times daily. 60 tablet 3    metoCLOPramide (REGLAN) 10 MG tablet Take 1 tablet (10 mg total) by mouth every 6 (six) hours as needed for nausea or vomiting. (Patient not taking: Reported on 09/26/2022) 30 tablet 0    NIFEdipine (PROCARDIA XL) 30 MG 24 hr tablet Take 1 tablet (30 mg total) by mouth daily. (Patient not taking: Reported on 10/10/2022) 30 tablet 5    ondansetron (ZOFRAN-ODT) 4 MG disintegrating tablet Take 1 tablet (4 mg total) by mouth every 8 (eight) hours as needed for nausea or vomiting. (Patient not taking: Reported on 11/07/2022) 20 tablet 0    prenatal vitamin w/FE, FA (PRENATAL 1 + 1) 27-1 MG TABS tablet Take 1 tablet by mouth daily at 12 noon. (Patient not taking: Reported on 10/24/2022) 30 tablet 11     Review of Systems   Constitutional:  Negative for chills, fatigue and fever.  Eyes:  Positive for photophobia and pain. Negative for visual disturbance.  Respiratory:  Negative for apnea, shortness of breath and wheezing.   Cardiovascular:  Negative for chest pain and palpitations.  Gastrointestinal:  Negative for abdominal pain, constipation, diarrhea, nausea and vomiting.  Genitourinary:  Positive for pelvic pain. Negative for difficulty urinating, dysuria, vaginal bleeding, vaginal discharge and vaginal pain.  Musculoskeletal:  Negative for back pain.  Neurological:  Positive for headaches. Negative for seizures and weakness.  Psychiatric/Behavioral:  Negative for suicidal ideas.    Physical Exam   Blood pressure (!) 140/93, pulse 71, temperature 97.9 F (36.6 C), temperature source Oral, resp. rate 14, height 5\' 5"  (1.651 m), weight 80.2 kg, last menstrual period 04/07/2022, SpO2 98 %, currently breastfeeding.  Physical Exam Vitals and nursing note reviewed.  Constitutional:      General: She is not in acute distress.    Appearance: Normal appearance.  HENT:     Head: Normocephalic.  Cardiovascular:     Rate and Rhythm: Normal rate and regular rhythm.  Pulmonary:     Effort: Pulmonary effort is normal.  Musculoskeletal:     Cervical back: Normal range of motion.  Skin:    General: Skin is warm and dry.  Neurological:     Mental Status: She is alert and oriented to person, place, and time.     GCS: GCS eye subscore is 4. GCS verbal subscore is 5. GCS motor subscore is 6.     Cranial Nerves: No cranial nerve deficit or facial asymmetry.     Coordination: Coordination normal.     Deep Tendon Reflexes: Reflexes normal.  Psychiatric:        Mood and Affect: Mood normal.   FHT: 125 bpm with moderate variability. (Appropriate for gestational age)  Toco: quiet   MAU Course  Procedures Orders Placed This Encounter  Procedures   Urinalysis, Routine w reflex microscopic -Urine, Clean Catch    CBC with Differential/Platelet   Comprehensive metabolic panel   Protein / creatinine ratio, urine   Meds ordered this encounter  Medications   acetaminophen-caffeine (EXCEDRIN TENSION HEADACHE) 500-65 MG per tablet 1 tablet   metoCLOPramide (REGLAN) tablet 10 mg    MDM - UA positive for 5 ketones otherwise normal. Low suspicion for UTI. Mild suspicion for dehydration.  - Patient denies having an alternative mode of transportation besides driving.  - 40:98 AM- Reassessment patient  resting but still rates headahe 8/10. Unrelieved with Excedrin and Reglan. Magnesium tablet and Fioricet ordered.  - 12:30 PM- Reassessment patient sleep. Reports headache improved  - PreE labs normal. Low suspicion for preE  - Plan for discharge  Assessment and Plan   1. Supervision of high risk pregnancy, antepartum   2. Pregnancy headache in third trimester   3. [redacted] weeks gestation of pregnancy    - Reviewed worsening signs and symptoms and return precautions.  - Recommended to Take her medication as prescribed.  - FHT appropriate for gestational age at time of discharge.  - Patient discharged home in stable condition and may return to MAU as needed.   Claudette Head, MSN CNM  11/20/2022, 9:35 AM

## 2022-11-21 ENCOUNTER — Encounter: Payer: Self-pay | Admitting: Obstetrics & Gynecology

## 2022-11-21 ENCOUNTER — Ambulatory Visit: Payer: PRIVATE HEALTH INSURANCE | Admitting: *Deleted

## 2022-11-21 ENCOUNTER — Ambulatory Visit: Payer: PRIVATE HEALTH INSURANCE | Attending: Maternal & Fetal Medicine

## 2022-11-21 ENCOUNTER — Ambulatory Visit (INDEPENDENT_AMBULATORY_CARE_PROVIDER_SITE_OTHER): Payer: Medicaid Other | Admitting: Obstetrics & Gynecology

## 2022-11-21 VITALS — BP 151/94 | HR 89 | Wt 179.0 lb

## 2022-11-21 VITALS — BP 145/89 | HR 78

## 2022-11-21 DIAGNOSIS — O10913 Unspecified pre-existing hypertension complicating pregnancy, third trimester: Secondary | ICD-10-CM | POA: Diagnosis present

## 2022-11-21 DIAGNOSIS — O09293 Supervision of pregnancy with other poor reproductive or obstetric history, third trimester: Secondary | ICD-10-CM

## 2022-11-21 DIAGNOSIS — E039 Hypothyroidism, unspecified: Secondary | ICD-10-CM

## 2022-11-21 DIAGNOSIS — O0990 Supervision of high risk pregnancy, unspecified, unspecified trimester: Secondary | ICD-10-CM | POA: Insufficient documentation

## 2022-11-21 DIAGNOSIS — O09523 Supervision of elderly multigravida, third trimester: Secondary | ICD-10-CM

## 2022-11-21 DIAGNOSIS — Z3A32 32 weeks gestation of pregnancy: Secondary | ICD-10-CM

## 2022-11-21 DIAGNOSIS — O99283 Endocrine, nutritional and metabolic diseases complicating pregnancy, third trimester: Secondary | ICD-10-CM | POA: Diagnosis not present

## 2022-11-21 DIAGNOSIS — R519 Headache, unspecified: Secondary | ICD-10-CM

## 2022-11-21 DIAGNOSIS — O09213 Supervision of pregnancy with history of pre-term labor, third trimester: Secondary | ICD-10-CM

## 2022-11-21 DIAGNOSIS — O10013 Pre-existing essential hypertension complicating pregnancy, third trimester: Secondary | ICD-10-CM | POA: Diagnosis not present

## 2022-11-21 DIAGNOSIS — O10919 Unspecified pre-existing hypertension complicating pregnancy, unspecified trimester: Secondary | ICD-10-CM

## 2022-11-21 DIAGNOSIS — O09299 Supervision of pregnancy with other poor reproductive or obstetric history, unspecified trimester: Secondary | ICD-10-CM

## 2022-11-21 DIAGNOSIS — O26893 Other specified pregnancy related conditions, third trimester: Secondary | ICD-10-CM

## 2022-11-21 MED ORDER — LABETALOL HCL 200 MG PO TABS: 400.0000 mg | ORAL_TABLET | Freq: Two times a day (BID) | ORAL | 3 refills | Status: DC

## 2022-11-21 MED ORDER — CYCLOBENZAPRINE HCL 10 MG PO TABS
10.0000 mg | ORAL_TABLET | Freq: Three times a day (TID) | ORAL | 2 refills | Status: DC | PRN
Start: 2022-11-21 — End: 2022-12-15

## 2022-11-21 MED ORDER — BUTALBITAL-APAP-CAFFEINE 50-300-40 MG PO CAPS
1.0000 | ORAL_CAPSULE | Freq: Four times a day (QID) | ORAL | 0 refills | Status: DC | PRN
Start: 2022-11-21 — End: 2022-12-15

## 2022-11-21 NOTE — Progress Notes (Signed)
PRENATAL VISIT NOTE  Subjective:  Brittany [name] is a 42 y.o. [id number] at [redacted]w[redacted]d being seen today for ongoing prenatal care.  She is currently monitored for the following issues for this high-risk pregnancy and has Chronic hypertension affecting pregnancy; Subclinical hyperthyroidism; Migraine; Murmur, cardiac; Anemia; Vitamin D deficiency; Supervision of high risk pregnancy, antepartum; Hx of preeclampsia, prior pregnancy, currently pregnant; and AMA (advanced maternal age) multigravida 35+ on their problem list.  Patient reports  slight headache.  Was seen in MAU last night for severe headache, alleviated by Fioricet and magnesium oxide . Patient has not taken prescribed Fioricet and Flexeril today yet.  Patient denies visual symptoms, RUQ/epigastric pain or other concerning symptoms.  Contractions: Irritability. Vag. Bleeding: None.  Movement: Present. Denies leaking of fluid.  Has a lot of pelvic pressure, wants cervical check.  The following portions of the patient's history were reviewed and updated as appropriate: allergies, current medications, past family history, past medical history, past social history, past surgical history and problem list.   Objective:   Vitals:   11/21/22 1432 11/21/22 1448  BP: (!) 150/94 (!) 151/94  Pulse: 84 89  Weight: 179 lb (81.2 kg)     Fetal Status: Fetal Heart Rate (bpm): 132   Movement: Present     General:  Alert, oriented and cooperative. Patient is in no acute distress.  Skin: Skin is warm and dry. No rash noted.   Cardiovascular: Normal heart rate noted  Respiratory: Normal respiratory effort, no problems with respiration noted  Abdomen: Soft, gravid, appropriate for gestational age.  Pain/Pressure: Present     Pelvic: Cervical exam performed in the presence of a chaperone Dilation: 1 Effacement (%): 50 Station: Ballotable  Extremities: Normal range of motion.  Edema: None  Mental Status: Normal mood and affect. Normal behavior. Normal  judgment and thought content.   Assessment and Plan:  Pregnancy: [name] at [redacted]w[redacted]d 1. Chronic hypertension affecting pregnancy 2. Hx of preeclampsia, prior pregnancy, currently pregnant Increased Labetalol dosage to 400 mg bid. Will continue to monitor. Continue antenatal testing and scans. Severe PEC precautions reviewed. - labetalol (NORMODYNE) 200 MG tablet; Take 2 tablets (400 mg total) by mouth 2 (two) times daily.  Dispense: 120 tablet; Refill: 3  3. Pregnancy headache in third trimester Advised to take medication, but can go to MAU for any severe headaches not relieved by medications. - cyclobenzaprine (FLEXERIL) 10 MG tablet; Take 1 tablet (10 mg total) by mouth 3 (three) times daily as needed for muscle spasms (headaches).  Dispense: 30 tablet; Refill: 2 - Butalbital-APAP-Caffeine (FIORICET) 50-300-40 MG CAPS; Take 1-2 capsules by mouth every 6 (six) hours as needed (severe headache).  Dispense: 20 capsule; Refill: 0  4. Multigravida of advanced maternal age in third trimester 5. [redacted] weeks gestation of pregnancy 6. Supervision of high risk pregnancy, antepartum No other concerns.  Preterm labor symptoms and general obstetric precautions including but not limited to vaginal bleeding, contractions, leaking of fluid and fetal movement were reviewed in detail with the patient. Please refer to After Visit Summary for other counseling recommendations.   Return in about 2 weeks (around 12/05/2022) for OFFICE OB VISIT (MD only).  Future Appointments  Date Time Provider Department Center  11/28/2022  7:15 AM [facility] NURSE [facility]  11/28/2022  7:30 AM [facility] US3 [facility]  12/05/2022  7:15 AM [facility] NURSE [facility]  12/05/2022  7:30 AM [facility] US2 [facility]  12/05/2022  2:30 PM Riven Mabile, Jethro Bastos, MD [facility]  12/19/2022  7:45 AM [facility] NURSE [facility]  12/19/2022  8:00 AM [facility] US1 [facility]  12/19/2022  2:30 PM Peyton Bing, MD [facility]   12/26/2022  2:30 PM  Bing, MD [facility]  01/02/2023  2:30 PM Reva Bores, MD [facility]  01/09/2023  2:30 PM Josselin Gaulin, Jethro Bastos, MD [facility]    Jaynie Collins, MD

## 2022-11-21 NOTE — Progress Notes (Signed)
ROB   MAU visit on yesterday.  Blood pressure elevated notes slight HA, denies any visual changes or swelling.  Pt wants cervix check due to pressure.

## 2022-11-21 NOTE — Patient Instructions (Signed)
Return to office for any scheduled appointments. Call the office or go to the MAU at [facility] [place] if: You begin to have strong, frequent contractions Your water breaks.  Sometimes it is a big gush of fluid, sometimes it is just a trickle that keeps getting your underwear wet or running down your legs You have vaginal bleeding.  It is normal to have a small amount of spotting if your cervix was checked.  You do not feel your baby moving like normal.  If you do not, get something to eat and drink and lay down and focus on feeling your baby move.   If your baby is still not moving like normal, you should call the office or go to MAU. Any other obstetric concerns.  

## 2022-11-24 ENCOUNTER — Encounter: Payer: Self-pay | Admitting: Obstetrics and Gynecology

## 2022-11-25 ENCOUNTER — Encounter (HOSPITAL_COMMUNITY): Payer: Self-pay | Admitting: Obstetrics and Gynecology

## 2022-11-25 ENCOUNTER — Inpatient Hospital Stay (HOSPITAL_COMMUNITY)
Admission: AD | Admit: 2022-11-25 | Discharge: 2022-11-25 | Disposition: A | Discharge status: Home / Self Care | Payer: PRIVATE HEALTH INSURANCE | Attending: Obstetrics and Gynecology | Admitting: Obstetrics and Gynecology

## 2022-11-25 DIAGNOSIS — O26893 Other specified pregnancy related conditions, third trimester: Secondary | ICD-10-CM | POA: Diagnosis not present

## 2022-11-25 DIAGNOSIS — O10913 Unspecified pre-existing hypertension complicating pregnancy, third trimester: Secondary | ICD-10-CM | POA: Insufficient documentation

## 2022-11-25 DIAGNOSIS — Z79899 Other long term (current) drug therapy: Secondary | ICD-10-CM | POA: Diagnosis not present

## 2022-11-25 DIAGNOSIS — Z7982 Long term (current) use of aspirin: Secondary | ICD-10-CM | POA: Diagnosis not present

## 2022-11-25 DIAGNOSIS — O09523 Supervision of elderly multigravida, third trimester: Secondary | ICD-10-CM | POA: Diagnosis present

## 2022-11-25 DIAGNOSIS — Z3A33 33 weeks gestation of pregnancy: Secondary | ICD-10-CM | POA: Insufficient documentation

## 2022-11-25 DIAGNOSIS — O212 Late vomiting of pregnancy: Secondary | ICD-10-CM | POA: Insufficient documentation

## 2022-11-25 DIAGNOSIS — O26899 Other specified pregnancy related conditions, unspecified trimester: Secondary | ICD-10-CM

## 2022-11-25 DIAGNOSIS — O0990 Supervision of high risk pregnancy, unspecified, unspecified trimester: Secondary | ICD-10-CM

## 2022-11-25 DIAGNOSIS — O219 Vomiting of pregnancy, unspecified: Secondary | ICD-10-CM

## 2022-11-25 DIAGNOSIS — O10919 Unspecified pre-existing hypertension complicating pregnancy, unspecified trimester: Secondary | ICD-10-CM

## 2022-11-25 LAB — COMPREHENSIVE METABOLIC PANEL
ALT: 28 U/L (ref 0–44)
AST: 39 U/L (ref 15–41)
Albumin: 2.7 g/dL — ABNORMAL LOW (ref 3.5–5.0)
Alkaline Phosphatase: 95 U/L (ref 38–126)
Anion gap: 11 (ref 5–15)
BUN: 5 mg/dL — ABNORMAL LOW (ref 6–20)
CO2: 21 mmol/L — ABNORMAL LOW (ref 22–32)
Calcium: 8.7 mg/dL — ABNORMAL LOW (ref 8.9–10.3)
Chloride: 102 mmol/L (ref 98–111)
Creatinine, Ser: 0.71 mg/dL (ref 0.44–1.00)
GFR, Estimated: >60 mL/min (ref >60)
Glucose, Bld: 80 mg/dL (ref 70–99)
Potassium: 3.7 mmol/L (ref 3.5–5.1)
Sodium: 134 mmol/L — ABNORMAL LOW (ref 135–145)
Total Bilirubin: 0.4 mg/dL (ref 0.3–1.2)
Total Protein: 6.5 g/dL (ref 6.5–8.1)

## 2022-11-25 LAB — CBC
HCT: 33.4 % — ABNORMAL LOW (ref 36.0–46.0)
Hemoglobin: 11.1 g/dL — ABNORMAL LOW (ref 12.0–15.0)
MCH: 27.5 pg (ref 26.0–34.0)
MCHC: 33.2 g/dL (ref 30.0–36.0)
MCV: 82.9 fL (ref 80.0–100.0)
Platelets: 224 10*3/uL (ref 150–400)
RBC: 4.03 MIL/uL (ref 3.87–5.11)
RDW: 13.6 % (ref 11.5–15.5)
WBC: 9 10*3/uL (ref 4.0–10.5)
nRBC: 0 % (ref 0.0–0.2)

## 2022-11-25 LAB — WET PREP, GENITAL
Clue Cells Wet Prep HPF POC: NONE SEEN
Sperm: NONE SEEN
Trich, Wet Prep: NONE SEEN
WBC, Wet Prep HPF POC: <10 (ref <10)
Yeast Wet Prep HPF POC: NONE SEEN

## 2022-11-25 LAB — PROTEIN / CREATININE RATIO, URINE
Creatinine, Urine: 214 mg/dL
Protein Creatinine Ratio: 0.11 mg/mg{Cre} (ref 0.00–0.15)
Total Protein, Urine: 23 mg/dL

## 2022-11-25 LAB — URINALYSIS, ROUTINE W REFLEX MICROSCOPIC
Bilirubin Urine: NEGATIVE
Glucose, UA: NEGATIVE mg/dL
Hgb urine dipstick: NEGATIVE
Ketones, ur: 5 mg/dL — AB
Leukocytes,Ua: NEGATIVE
Nitrite: NEGATIVE
Protein, ur: 30 mg/dL — AB
Specific Gravity, Urine: 1.024 (ref 1.005–1.030)
pH: 6 (ref 5.0–8.0)

## 2022-11-25 MED ORDER — PROMETHAZINE HCL 25 MG PO TABS: 25.0000 mg | ORAL_TABLET | Freq: Four times a day (QID) | ORAL | 0 refills | Status: DC | PRN

## 2022-11-25 MED ORDER — ACETAMINOPHEN 500 MG PO TABS
1000.0000 mg | ORAL_TABLET | Freq: Once | ORAL | Status: AC
  Administered 2022-11-25: 1000 mg via ORAL
  Filled 2022-11-25: qty 2

## 2022-11-25 MED ORDER — PROMETHAZINE HCL 25 MG PO TABS
25.0000 mg | ORAL_TABLET | Freq: Once | ORAL | Status: AC
  Administered 2022-11-25: 25 mg via ORAL
  Filled 2022-11-25: qty 1

## 2022-11-25 NOTE — Discharge Instructions (Signed)

## 2022-11-25 NOTE — MAU Note (Signed)
RN called lab to inquire about  protein creatinine ratio and Urinalysis. Lab tech answered and informed RN that that they have received sample and will process now.Marland Kitchen

## 2022-11-25 NOTE — MAU Provider Note (Signed)
History     CSN: [id number]  Arrival date and time: 11/25/22 1343   Event Date/Time   First Provider Initiated Contact with Patient 11/25/22 1434      Chief Complaint  Patient presents with   Abdominal Pain   Brittany [name] is a 42 y.o. [id number] at [redacted]w[redacted]d who presents today with abdominal pain and nausea/vomiting. She states that this started yesterday. She has nausea and vomiting. The vomiting has stopped, but she has continued to feel nauseous and have abdominal pain. She denies any VB or LOF. She is unsure if she is feeling contractions, but the pain is in her lower abdomen. She reports normal fetal movement.   Abdominal Pain This is a new problem. The current episode started yesterday. The problem occurs intermittently. The problem has been unchanged. The pain is located in the suprapubic region. The pain is moderate. The quality of the pain is cramping. The pain is relieved by Nothing. She has tried nothing for the symptoms.    OB History     Gravida  10   Para  4   Term  2   Preterm  2   AB  5   Living  4      SAB  4   IAB  1   Ectopic  0   Multiple  0   Live Births  4           Past Medical History:  Diagnosis Date   Gallstone    Headache(784.0)    Heart murmur    Hypertension    Infection    UTI   Maternal chronic hypertension in first trimester 04/09/2011   Pregnancy induced hypertension    after 2nd delivery   Vaginal Pap smear, abnormal    cant remember    Past Surgical History:  Procedure Laterality Date   DILATION AND CURETTAGE OF UTERUS     dont remember year   LEEP      Family History  Problem Relation Age of Onset   Asthma Mother    Hypertension Mother    CAD Mother    Asthma Maternal Grandmother    Hypertension Maternal Grandmother     Social History   Tobacco Use   Smoking status: Never   Smokeless tobacco: Never  Vaping Use   Vaping Use: Never used  Substance Use Topics   Alcohol use: No    Alcohol/week:  0.0 standard drinks of alcohol   Drug use: No    Allergies: No Known Allergies  Medications Prior to Admission  Medication Sig Dispense Refill Last Dose   aspirin EC 81 MG tablet Take 1 tablet (81 mg total) by mouth daily. Take after 12 weeks for prevention of preeclampsia later in pregnancy 300 tablet 2 11/25/2022   cyclobenzaprine (FLEXERIL) 10 MG tablet Take 1 tablet (10 mg total) by mouth 3 (three) times daily as needed for muscle spasms (headaches). 30 tablet 2 11/24/2022   labetalol (NORMODYNE) 200 MG tablet Take 2 tablets (400 mg total) by mouth 2 (two) times daily. 120 tablet 3 11/25/2022   prenatal vitamin w/FE, FA (PRENATAL 1 + 1) 27-1 MG TABS tablet Take 1 tablet by mouth daily at 12 noon. 30 tablet 11 11/25/2022   Butalbital-APAP-Caffeine (FIORICET) 50-300-40 MG CAPS Take 1-2 capsules by mouth every 6 (six) hours as needed (severe headache). 20 capsule 0     Review of Systems  Gastrointestinal:  Positive for abdominal pain.  All other systems reviewed and are  negative.  Physical Exam   Blood pressure 136/67, pulse 74, temperature 97.9 F (36.6 C), resp. rate 18, height 5\' 5"  (1.651 m), weight 79.4 kg, last menstrual period 04/07/2022, SpO2 97 %, currently breastfeeding.  Physical Exam Vitals and nursing note reviewed. Exam conducted with a chaperone present.  Constitutional:      General: She is not in acute distress. HENT:     Head: Normocephalic.  Eyes:     Pupils: Pupils are equal, round, and reactive to light.  Cardiovascular:     Rate and Rhythm: Normal rate.  Pulmonary:     Effort: Pulmonary effort is normal.  Abdominal:     Palpations: Abdomen is soft.     Tenderness: There is no abdominal tenderness.  Genitourinary:    Comments: External: no lesion Vagina: small amount of white discharge Cervix: 1/thick/ballotable  Uterus: AGA  Musculoskeletal:        General: Normal range of motion.  Skin:    General: Skin is warm and dry.  Neurological:     Mental  Status: She is alert and oriented to person, place, and time.  Psychiatric:        Mood and Affect: Mood normal.        Behavior: Behavior normal.    NST:  Baseline: 130 Variability: moderate Accels: 15x15 Decels: none Toco: none Reactive/Appropriate for GA   Patient Vitals for the past 24 hrs:  BP Temp Pulse Resp SpO2 Height Weight  11/25/22 1822 136/67 -- 74 -- 97 % -- --  11/25/22 1745 (!) 122/102 -- 85 -- 99 % -- --  11/25/22 1730 131/82 -- 67 -- 99 % -- --  11/25/22 1715 129/84 -- 68 -- 99 % -- --  11/25/22 1700 127/81 -- 63 -- 99 % -- --  11/25/22 1645 134/78 -- 67 -- 99 % -- --  11/25/22 1630 135/86 -- 66 -- 99 % -- --  11/25/22 1615 127/80 -- 63 -- 99 % -- --  11/25/22 1600 (!) 142/88 -- 72 -- 99 % -- --  11/25/22 1545 131/80 -- 66 -- 98 % -- --  11/25/22 1530 123/89 -- 66 -- 98 % -- --  11/25/22 1515 133/85 -- 71 -- 100 % -- --  11/25/22 1500 123/83 -- 70 -- 98 % -- --  11/25/22 1445 136/84 -- 74 -- 99 % -- --  11/25/22 1430 -- -- 77 -- 99 % -- --  11/25/22 1416 (!) 141/85 97.9 F (36.6 C) 77 18 -- 5\' 5"  (1.651 m) 79.4 kg   Results for orders placed or performed during the hospital encounter of 11/25/22 (from the past 24 hour(s))  Urinalysis, Routine w reflex microscopic -Urine, Clean Catch     Status: Abnormal   Collection Time: 11/25/22  2:30 PM  Result Value Ref Range   Color, Urine AMBER (A) YELLOW   APPearance HAZY (A) CLEAR   Specific Gravity, Urine 1.024 1.005 - 1.030   pH 6.0 5.0 - 8.0   Glucose, UA NEGATIVE NEGATIVE mg/dL   Hgb urine dipstick NEGATIVE NEGATIVE   Bilirubin Urine NEGATIVE NEGATIVE   Ketones, ur 5 (A) NEGATIVE mg/dL   Protein, ur 30 (A) NEGATIVE mg/dL   Nitrite NEGATIVE NEGATIVE   Leukocytes,Ua NEGATIVE NEGATIVE   RBC / HPF 0-5 0 - 5 RBC/hpf   WBC, UA 0-5 0 - 5 WBC/hpf   Bacteria, UA RARE (A) NONE SEEN   Squamous Epithelial / HPF 0-5 0 - 5 /HPF   Mucus PRESENT  Protein / creatinine ratio, urine     Status: None   Collection  Time: 11/25/22  2:30 PM  Result Value Ref Range   Creatinine, Urine 214 mg/dL   Total Protein, Urine 23 mg/dL   Protein Creatinine Ratio 0.11 0.00 - 0.15 mg/mg[Cre]  CBC     Status: Abnormal   Collection Time: 11/25/22  2:43 PM  Result Value Ref Range   WBC 9.0 4.0 - 10.5 K/uL   RBC 4.03 3.87 - 5.11 MIL/uL   Hemoglobin 11.1 (L) 12.0 - 15.0 g/dL   HCT 16.1 (L) 09.6 - 04.5 %   MCV 82.9 80.0 - 100.0 fL   MCH 27.5 26.0 - 34.0 pg   MCHC 33.2 30.0 - 36.0 g/dL   RDW 40.9 81.1 - 91.4 %   Platelets 224 150 - 400 K/uL   nRBC 0.0 0.0 - 0.2 %  Comprehensive metabolic panel     Status: Abnormal   Collection Time: 11/25/22  2:43 PM  Result Value Ref Range   Sodium 134 (L) 135 - 145 mmol/L   Potassium 3.7 3.5 - 5.1 mmol/L   Chloride 102 98 - 111 mmol/L   CO2 21 (L) 22 - 32 mmol/L   Glucose, Bld 80 70 - 99 mg/dL   BUN 5 (L) 6 - 20 mg/dL   Creatinine, Ser 7.82 0.44 - 1.00 mg/dL   Calcium 8.7 (L) 8.9 - 10.3 mg/dL   Total Protein 6.5 6.5 - 8.1 g/dL   Albumin 2.7 (L) 3.5 - 5.0 g/dL   AST 39 15 - 41 U/L   ALT 28 0 - 44 U/L   Alkaline Phosphatase 95 38 - 126 U/L   Total Bilirubin 0.4 0.3 - 1.2 mg/dL   GFR, Estimated >95 >62 mL/min   Anion gap 11 5 - 15  Wet prep, genital     Status: None   Collection Time: 11/25/22  2:50 PM  Result Value Ref Range   Yeast Wet Prep HPF POC NONE SEEN NONE SEEN   Trich, Wet Prep NONE SEEN NONE SEEN   Clue Cells Wet Prep HPF POC NONE SEEN NONE SEEN   WBC, Wet Prep HPF POC <10 <10   Sperm NONE SEEN      MAU Course  Procedures  MDM Patient has had phenergan and Tylenol PO. She reports that she is feeling better.  No contractions tracing on the monitor  6:23pm repeat cervical exam, no change  BP mostly normal, will not adjust labetalol at this time.   Assessment and Plan   1. Supervision of high risk pregnancy, antepartum   2. Nausea/vomiting in pregnancy   3. Pain of round ligament affecting pregnancy, antepartum   4. [redacted] weeks gestation of pregnancy    5. Chronic hypertension affecting pregnancy    DC home Comfort measures reviewed  1st/2nd/3rd Trimester precautions  Bleeding precautions Ectopic precautions PTL precautions  Fetal kick counts RX: Return to MAU as needed FU with OB as planned   Follow-up Information     [facility] Follow up.   Specialty: Obstetrics and Gynecology Why: As scheduled Contact information: [address] [place] [address] [phone]               Thressa Sheller DNP, CNM  11/25/22  6:26 PM

## 2022-11-25 NOTE — MAU Note (Signed)
.  Brittany [name] is a 42 y.o. at [redacted]w[redacted]d here in MAU reporting: lower abd cramping and pressure since yesterday. Denies any vag bleeding or dischare. And good fetal movement reported  LMP:  Onset of complaint: yesterday Pain score: 8 Vitals:   11/25/22 1416  BP: (!) 141/85  Pulse: 77  Resp: 18  Temp: 97.9 F (36.6 C)     FHT:138 Lab orders placed from triage:

## 2022-11-27 ENCOUNTER — Encounter (HOSPITAL_COMMUNITY): Payer: Self-pay | Admitting: Obstetrics and Gynecology

## 2022-11-27 ENCOUNTER — Inpatient Hospital Stay (HOSPITAL_COMMUNITY)
Admission: AD | Admit: 2022-11-27 | Discharge: 2022-11-27 | Disposition: A | Discharge status: Home / Self Care | Payer: PRIVATE HEALTH INSURANCE | Attending: Obstetrics and Gynecology | Admitting: Obstetrics and Gynecology

## 2022-11-27 DIAGNOSIS — R519 Headache, unspecified: Secondary | ICD-10-CM | POA: Diagnosis not present

## 2022-11-27 DIAGNOSIS — O09523 Supervision of elderly multigravida, third trimester: Secondary | ICD-10-CM | POA: Diagnosis not present

## 2022-11-27 DIAGNOSIS — O10913 Unspecified pre-existing hypertension complicating pregnancy, third trimester: Secondary | ICD-10-CM | POA: Insufficient documentation

## 2022-11-27 DIAGNOSIS — Z87440 Personal history of urinary (tract) infections: Secondary | ICD-10-CM | POA: Insufficient documentation

## 2022-11-27 DIAGNOSIS — O10919 Unspecified pre-existing hypertension complicating pregnancy, unspecified trimester: Secondary | ICD-10-CM

## 2022-11-27 DIAGNOSIS — Z3A33 33 weeks gestation of pregnancy: Secondary | ICD-10-CM | POA: Insufficient documentation

## 2022-11-27 DIAGNOSIS — O26893 Other specified pregnancy related conditions, third trimester: Secondary | ICD-10-CM

## 2022-11-27 LAB — URINALYSIS, ROUTINE W REFLEX MICROSCOPIC
Bilirubin Urine: NEGATIVE
Glucose, UA: NEGATIVE mg/dL
Hgb urine dipstick: NEGATIVE
Ketones, ur: NEGATIVE mg/dL
Leukocytes,Ua: NEGATIVE
Nitrite: NEGATIVE
Protein, ur: NEGATIVE mg/dL
Specific Gravity, Urine: 1.02 (ref 1.005–1.030)
pH: 7 (ref 5.0–8.0)

## 2022-11-27 LAB — COMPREHENSIVE METABOLIC PANEL
ALT: 32 U/L (ref 0–44)
AST: 54 U/L — ABNORMAL HIGH (ref 15–41)
Albumin: 2.6 g/dL — ABNORMAL LOW (ref 3.5–5.0)
Alkaline Phosphatase: 91 U/L (ref 38–126)
Anion gap: 9 (ref 5–15)
BUN: 10 mg/dL (ref 6–20)
CO2: 20 mmol/L — ABNORMAL LOW (ref 22–32)
Calcium: 8.6 mg/dL — ABNORMAL LOW (ref 8.9–10.3)
Chloride: 105 mmol/L (ref 98–111)
Creatinine, Ser: 0.62 mg/dL (ref 0.44–1.00)
GFR, Estimated: >60 mL/min (ref >60)
Glucose, Bld: 83 mg/dL (ref 70–99)
Potassium: 4.4 mmol/L (ref 3.5–5.1)
Sodium: 134 mmol/L — ABNORMAL LOW (ref 135–145)
Total Bilirubin: 0.5 mg/dL (ref 0.3–1.2)
Total Protein: 6.5 g/dL (ref 6.5–8.1)

## 2022-11-27 LAB — CBC
HCT: 34.3 % — ABNORMAL LOW (ref 36.0–46.0)
Hemoglobin: 11.1 g/dL — ABNORMAL LOW (ref 12.0–15.0)
MCH: 27.5 pg (ref 26.0–34.0)
MCHC: 32.4 g/dL (ref 30.0–36.0)
MCV: 84.9 fL (ref 80.0–100.0)
Platelets: 205 10*3/uL (ref 150–400)
RBC: 4.04 MIL/uL (ref 3.87–5.11)
RDW: 13.6 % (ref 11.5–15.5)
WBC: 8.7 10*3/uL (ref 4.0–10.5)
nRBC: 0 % (ref 0.0–0.2)

## 2022-11-27 LAB — PROTEIN / CREATININE RATIO, URINE
Creatinine, Urine: 133 mg/dL
Protein Creatinine Ratio: 0.12 mg/mg{Cre} (ref 0.00–0.15)
Total Protein, Urine: 16 mg/dL

## 2022-11-27 MED ORDER — METOCLOPRAMIDE HCL 5 MG/ML IJ SOLN
10.0000 mg | Freq: Once | INTRAMUSCULAR | Status: AC
  Administered 2022-11-27: 10 mg via INTRAVENOUS
  Filled 2022-11-27: qty 2

## 2022-11-27 MED ORDER — LACTATED RINGERS IV BOLUS
1000.0000 mL | Freq: Once | INTRAVENOUS | Status: AC
  Administered 2022-11-27: 1000 mL via INTRAVENOUS

## 2022-11-27 MED ORDER — CAFFEINE 200 MG PO TABS
200.0000 mg | ORAL_TABLET | Freq: Once | ORAL | Status: AC
  Administered 2022-11-27: 200 mg via ORAL
  Filled 2022-11-27: qty 1

## 2022-11-27 MED ORDER — LABETALOL HCL 200 MG PO TABS
400.0000 mg | ORAL_TABLET | Freq: Three times a day (TID) | ORAL | 3 refills | Status: DC
Start: 2022-11-27 — End: 2022-12-05

## 2022-11-27 NOTE — MAU Provider Note (Signed)
History     CSN: [id number]  Arrival date and time: 11/27/22 1932   Event Date/Time   First Provider Initiated Contact with Patient 11/27/22 2014      Chief Complaint  Patient presents with   Abdominal Pain   Hypertension   Headache   HPI  Brittany [name] is a 42 y.o. [id number] at [redacted]w[redacted]d who presents for evaluation of a headache. Patient reports she has a headache that started at 1430. Patient rates the pain as a 7/10 and has tried Tylenol for the pain with no relief. She reports she takes 200mg  BID labetalol and took her doses as prescribed today. She reports some blurred vision but denies any epigastric pain. She was seen on 5/10 with similar complaints and normal labs. She denies any vaginal bleeding, discharge, and leaking of fluid. Denies any constipation, diarrhea or any urinary complaints. Reports normal fetal movement.   OB History     Gravida  10   Para  4   Term  2   Preterm  2   AB  5   Living  4      SAB  4   IAB  1   Ectopic  0   Multiple  0   Live Births  4           Past Medical History:  Diagnosis Date   Gallstone    Headache(784.0)    Heart murmur    Hypertension    Infection    UTI   Maternal chronic hypertension in first trimester 04/09/2011   Pregnancy induced hypertension    after 2nd delivery   Vaginal Pap smear, abnormal    cant remember    Past Surgical History:  Procedure Laterality Date   DILATION AND CURETTAGE OF UTERUS     dont remember year   LEEP      Family History  Problem Relation Age of Onset   Asthma Mother    Hypertension Mother    CAD Mother    Asthma Maternal Grandmother    Hypertension Maternal Grandmother     Social History   Tobacco Use   Smoking status: Never   Smokeless tobacco: Never  Vaping Use   Vaping Use: Never used  Substance Use Topics   Alcohol use: No    Alcohol/week: 0.0 standard drinks of alcohol   Drug use: No    Allergies: No Known Allergies  No medications prior to  admission.    Review of Systems  Constitutional: Negative.  Negative for fatigue and fever.  HENT: Negative.    Respiratory: Negative.  Negative for shortness of breath.   Cardiovascular: Negative.  Negative for chest pain.  Gastrointestinal: Negative.  Negative for abdominal pain, constipation, diarrhea, nausea and vomiting.  Genitourinary: Negative.  Negative for dysuria, vaginal bleeding and vaginal discharge.  Neurological:  Positive for headaches. Negative for dizziness.   Physical Exam   Blood pressure 131/89, pulse 66, temperature 98.5 F (36.9 C), temperature source Oral, resp. rate 18, height 5\' 5"  (1.651 m), weight 80.6 kg, last menstrual period 04/07/2022, SpO2 99 %, currently breastfeeding.  Patient Vitals for the past 24 hrs:  BP Temp Temp src Pulse Resp SpO2 Height Weight  11/27/22 2229 131/89 -- -- -- -- -- -- --  11/27/22 2226 131/89 -- -- 66 -- -- -- --  11/27/22 2146 136/86 -- -- 69 -- -- -- --  11/27/22 2131 134/86 -- -- 71 -- -- -- --  11/27/22 2116 136/87 -- --  76 -- 99 % -- --  11/27/22 2101 (!) 142/92 -- -- 95 -- -- -- --  11/27/22 2031 (!) 144/94 -- -- 82 -- -- -- --  11/27/22 2016 (!) 145/93 -- -- 81 -- -- -- --  11/27/22 2003 (!) 147/101 -- -- 78 -- 99 % -- --  11/27/22 1948 (!) 140/91 98.5 F (36.9 C) Oral 81 18 99 % 5\' 5"  (1.651 m) 80.6 kg    Physical Exam Vitals and nursing note reviewed.  Constitutional:      General: She is not in acute distress.    Appearance: She is well-developed.  HENT:     Head: Normocephalic.  Eyes:     Pupils: Pupils are equal, round, and reactive to light.  Cardiovascular:     Rate and Rhythm: Normal rate and regular rhythm.     Heart sounds: Normal heart sounds.  Pulmonary:     Effort: Pulmonary effort is normal. No respiratory distress.     Breath sounds: Normal breath sounds.  Abdominal:     General: Bowel sounds are normal. There is no distension.     Palpations: Abdomen is soft.     Tenderness: There is no  abdominal tenderness.  Skin:    General: Skin is warm and dry.  Neurological:     Mental Status: She is alert and oriented to person, place, and time.     Motor: No abnormal muscle tone.     Coordination: Coordination normal.     Deep Tendon Reflexes: Reflexes are normal and symmetric. Reflexes normal.  Psychiatric:        Behavior: Behavior normal.        Thought Content: Thought content normal.        Judgment: Judgment normal.     Fetal Tracing:  Baseline: 135 Variability: moderate Accels: 15x15 Decels: none  Toco: none   MAU Course  Procedures  Results for orders placed or performed during the hospital encounter of 11/27/22 (from the past 24 hour(s))  Urinalysis, Routine w reflex microscopic -Urine, Clean Catch     Status: Abnormal   Collection Time: 11/27/22  7:38 PM  Result Value Ref Range   Color, Urine YELLOW YELLOW   APPearance HAZY (A) CLEAR   Specific Gravity, Urine 1.020 1.005 - 1.030   pH 7.0 5.0 - 8.0   Glucose, UA NEGATIVE NEGATIVE mg/dL   Hgb urine dipstick NEGATIVE NEGATIVE   Bilirubin Urine NEGATIVE NEGATIVE   Ketones, ur NEGATIVE NEGATIVE mg/dL   Protein, ur NEGATIVE NEGATIVE mg/dL   Nitrite NEGATIVE NEGATIVE   Leukocytes,Ua NEGATIVE NEGATIVE  Protein / creatinine ratio, urine     Status: None   Collection Time: 11/27/22  7:38 PM  Result Value Ref Range   Creatinine, Urine 133 mg/dL   Total Protein, Urine 16 mg/dL   Protein Creatinine Ratio 0.12 0.00 - 0.15 mg/mg[Cre]  CBC     Status: Abnormal   Collection Time: 11/27/22  8:48 PM  Result Value Ref Range   WBC 8.7 4.0 - 10.5 K/uL   RBC 4.04 3.87 - 5.11 MIL/uL   Hemoglobin 11.1 (L) 12.0 - 15.0 g/dL   HCT 16.1 (L) 09.6 - 04.5 %   MCV 84.9 80.0 - 100.0 fL   MCH 27.5 26.0 - 34.0 pg   MCHC 32.4 30.0 - 36.0 g/dL   RDW 40.9 81.1 - 91.4 %   Platelets 205 150 - 400 K/uL   nRBC 0.0 0.0 - 0.2 %  Comprehensive metabolic panel  Status: Abnormal   Collection Time: 11/27/22  8:48 PM  Result Value  Ref Range   Sodium 134 (L) 135 - 145 mmol/L   Potassium 4.4 3.5 - 5.1 mmol/L   Chloride 105 98 - 111 mmol/L   CO2 20 (L) 22 - 32 mmol/L   Glucose, Bld 83 70 - 99 mg/dL   BUN 10 6 - 20 mg/dL   Creatinine, Ser 0.98 0.44 - 1.00 mg/dL   Calcium 8.6 (L) 8.9 - 10.3 mg/dL   Total Protein 6.5 6.5 - 8.1 g/dL   Albumin 2.6 (L) 3.5 - 5.0 g/dL   AST 54 (H) 15 - 41 U/L   ALT 32 0 - 44 U/L   Alkaline Phosphatase 91 38 - 126 U/L   Total Bilirubin 0.5 0.3 - 1.2 mg/dL   GFR, Estimated >11 >91 mL/min   Anion gap 9 5 - 15     MDM Labs ordered and reviewed.   UA CBC, CMP, Protein/creat ratio  LR bolus Reglan IV Caffeine tablet  Patient reports resolution of HA  CNM consulted with Dr. Berton Lan regarding presentation and results with slightly increased LFT- MD recommends increasing labetalol to 400mg  TID and ok to discharge home  Assessment and Plan   1. Chronic hypertension affecting pregnancy   2. Pregnancy headache in third trimester   3. [redacted] weeks gestation of pregnancy     -Discharge home in stable condition -Preeclampsia precautions discussed -Patient advised to follow-up with OB as scheduled for prenatal care tomorrow.  -Patient may return to MAU as needed or if her condition were to change or worsen  Rolm Bookbinder, CNM 11/28/2022, 8:15 PM

## 2022-11-27 NOTE — Discharge Instructions (Signed)

## 2022-11-27 NOTE — MAU Note (Addendum)
.  Brittany [name] is a 42 y.o. at [redacted]w[redacted]d here in MAU reporting: lower ABD cramping, HA, blurry vision, experiencing floaters since 1430 today. Pt took Tylenol 1000mg  @ 1200, no relief. Pt took her prescribed Labetalol 200mg  BID, last dose this am @ 1100. Pt reports DFM with only 2-3 fetal movements noticed. Pt denies other PIH s/s, VB, LOF, recent intercourse, and complications in the pregnancy. Hx of CHTN, PreE  Onset of complaint: 1430 Pain score: 7/10 ABD, HA Vitals:   11/27/22 1948  BP: (!) 140/91  Pulse: 81  Resp: 18  Temp: 98.5 F (36.9 C)  SpO2: 99%     FHT:150 Lab orders placed from triage:  UA

## 2022-11-28 ENCOUNTER — Ambulatory Visit: Payer: PRIVATE HEALTH INSURANCE | Attending: Maternal & Fetal Medicine

## 2022-11-28 ENCOUNTER — Ambulatory Visit: Payer: PRIVATE HEALTH INSURANCE | Admitting: *Deleted

## 2022-11-28 VITALS — BP 139/84 | HR 68

## 2022-11-28 DIAGNOSIS — O99283 Endocrine, nutritional and metabolic diseases complicating pregnancy, third trimester: Secondary | ICD-10-CM

## 2022-11-28 DIAGNOSIS — O09213 Supervision of pregnancy with history of pre-term labor, third trimester: Secondary | ICD-10-CM

## 2022-11-28 DIAGNOSIS — O0990 Supervision of high risk pregnancy, unspecified, unspecified trimester: Secondary | ICD-10-CM | POA: Diagnosis present

## 2022-11-28 DIAGNOSIS — O10013 Pre-existing essential hypertension complicating pregnancy, third trimester: Secondary | ICD-10-CM

## 2022-11-28 DIAGNOSIS — E079 Disorder of thyroid, unspecified: Secondary | ICD-10-CM

## 2022-11-28 DIAGNOSIS — O10913 Unspecified pre-existing hypertension complicating pregnancy, third trimester: Secondary | ICD-10-CM | POA: Insufficient documentation

## 2022-11-28 DIAGNOSIS — O09293 Supervision of pregnancy with other poor reproductive or obstetric history, third trimester: Secondary | ICD-10-CM

## 2022-11-28 DIAGNOSIS — O09523 Supervision of elderly multigravida, third trimester: Secondary | ICD-10-CM | POA: Diagnosis not present

## 2022-11-28 DIAGNOSIS — Z3A33 33 weeks gestation of pregnancy: Secondary | ICD-10-CM

## 2022-11-28 LAB — GC/CHLAMYDIA PROBE AMP (~~LOC~~) NOT AT ARMC
Chlamydia: NEGATIVE
Comment: NEGATIVE
Comment: NORMAL
Neisseria Gonorrhea: NEGATIVE

## 2022-11-29 ENCOUNTER — Encounter: Payer: Self-pay | Admitting: Obstetrics and Gynecology

## 2022-12-02 ENCOUNTER — Encounter: Payer: Self-pay | Admitting: Obstetrics and Gynecology

## 2022-12-02 ENCOUNTER — Encounter: Payer: Self-pay | Admitting: *Deleted

## 2022-12-04 ENCOUNTER — Telehealth: Payer: Medicaid Other | Admitting: Family Medicine

## 2022-12-04 DIAGNOSIS — Z3490 Encounter for supervision of normal pregnancy, unspecified, unspecified trimester: Secondary | ICD-10-CM

## 2022-12-04 NOTE — Progress Notes (Signed)
Advised to contact OB on call-DWB

## 2022-12-05 ENCOUNTER — Encounter: Payer: Medicaid Other | Admitting: Obstetrics & Gynecology

## 2022-12-05 ENCOUNTER — Inpatient Hospital Stay (HOSPITAL_COMMUNITY)
Admission: AD | Admit: 2022-12-05 | Discharge: 2022-12-05 | Disposition: A | Discharge status: Home / Self Care | Payer: PRIVATE HEALTH INSURANCE | Attending: Obstetrics and Gynecology | Admitting: Obstetrics and Gynecology

## 2022-12-05 ENCOUNTER — Other Ambulatory Visit: Payer: Self-pay | Admitting: Maternal & Fetal Medicine

## 2022-12-05 ENCOUNTER — Encounter: Payer: Self-pay | Admitting: Obstetrics & Gynecology

## 2022-12-05 ENCOUNTER — Ambulatory Visit: Payer: PRIVATE HEALTH INSURANCE | Attending: Maternal & Fetal Medicine | Admitting: Maternal & Fetal Medicine

## 2022-12-05 ENCOUNTER — Encounter: Payer: Self-pay | Admitting: *Deleted

## 2022-12-05 ENCOUNTER — Ambulatory Visit: Payer: PRIVATE HEALTH INSURANCE | Admitting: *Deleted

## 2022-12-05 ENCOUNTER — Ambulatory Visit: Payer: PRIVATE HEALTH INSURANCE | Attending: Maternal & Fetal Medicine

## 2022-12-05 ENCOUNTER — Ambulatory Visit (INDEPENDENT_AMBULATORY_CARE_PROVIDER_SITE_OTHER): Payer: PRIVATE HEALTH INSURANCE | Admitting: Obstetrics & Gynecology

## 2022-12-05 ENCOUNTER — Encounter (HOSPITAL_COMMUNITY): Payer: Self-pay | Admitting: Obstetrics and Gynecology

## 2022-12-05 VITALS — BP 157/85

## 2022-12-05 VITALS — BP 130/86 | HR 81

## 2022-12-05 VITALS — BP 166/96 | HR 72

## 2022-12-05 DIAGNOSIS — E039 Hypothyroidism, unspecified: Secondary | ICD-10-CM

## 2022-12-05 DIAGNOSIS — O09293 Supervision of pregnancy with other poor reproductive or obstetric history, third trimester: Secondary | ICD-10-CM | POA: Insufficient documentation

## 2022-12-05 DIAGNOSIS — O368130 Decreased fetal movements, third trimester, not applicable or unspecified: Secondary | ICD-10-CM | POA: Insufficient documentation

## 2022-12-05 DIAGNOSIS — O09213 Supervision of pregnancy with history of pre-term labor, third trimester: Secondary | ICD-10-CM

## 2022-12-05 DIAGNOSIS — O09299 Supervision of pregnancy with other poor reproductive or obstetric history, unspecified trimester: Secondary | ICD-10-CM

## 2022-12-05 DIAGNOSIS — Z3A34 34 weeks gestation of pregnancy: Secondary | ICD-10-CM

## 2022-12-05 DIAGNOSIS — O09523 Supervision of elderly multigravida, third trimester: Secondary | ICD-10-CM | POA: Diagnosis not present

## 2022-12-05 DIAGNOSIS — O10913 Unspecified pre-existing hypertension complicating pregnancy, third trimester: Secondary | ICD-10-CM | POA: Insufficient documentation

## 2022-12-05 DIAGNOSIS — O10919 Unspecified pre-existing hypertension complicating pregnancy, unspecified trimester: Secondary | ICD-10-CM

## 2022-12-05 DIAGNOSIS — O0990 Supervision of high risk pregnancy, unspecified, unspecified trimester: Secondary | ICD-10-CM

## 2022-12-05 DIAGNOSIS — Z7982 Long term (current) use of aspirin: Secondary | ICD-10-CM | POA: Diagnosis not present

## 2022-12-05 DIAGNOSIS — I10 Essential (primary) hypertension: Secondary | ICD-10-CM | POA: Diagnosis not present

## 2022-12-05 DIAGNOSIS — O10013 Pre-existing essential hypertension complicating pregnancy, third trimester: Secondary | ICD-10-CM | POA: Diagnosis not present

## 2022-12-05 DIAGNOSIS — Z3689 Encounter for other specified antenatal screening: Secondary | ICD-10-CM

## 2022-12-05 DIAGNOSIS — O99283 Endocrine, nutritional and metabolic diseases complicating pregnancy, third trimester: Secondary | ICD-10-CM

## 2022-12-05 LAB — URINALYSIS, ROUTINE W REFLEX MICROSCOPIC
Bilirubin Urine: NEGATIVE
Glucose, UA: NEGATIVE mg/dL
Hgb urine dipstick: NEGATIVE
Ketones, ur: 20 mg/dL — AB
Leukocytes,Ua: NEGATIVE
Nitrite: NEGATIVE
Protein, ur: NEGATIVE mg/dL
Specific Gravity, Urine: 1.009 (ref 1.005–1.030)
pH: 7 (ref 5.0–8.0)

## 2022-12-05 LAB — CBC
HCT: 33.6 % — ABNORMAL LOW (ref 36.0–46.0)
Hemoglobin: 10.7 g/dL — ABNORMAL LOW (ref 12.0–15.0)
MCH: 27.5 pg (ref 26.0–34.0)
MCHC: 31.8 g/dL (ref 30.0–36.0)
MCV: 86.4 fL (ref 80.0–100.0)
Platelets: 201 10*3/uL (ref 150–400)
RBC: 3.89 MIL/uL (ref 3.87–5.11)
RDW: 13.6 % (ref 11.5–15.5)
WBC: 9.1 10*3/uL (ref 4.0–10.5)
nRBC: 0 % (ref 0.0–0.2)

## 2022-12-05 LAB — COMPREHENSIVE METABOLIC PANEL
ALT: 25 U/L (ref 0–44)
AST: 27 U/L (ref 15–41)
Albumin: 2.6 g/dL — ABNORMAL LOW (ref 3.5–5.0)
Alkaline Phosphatase: 89 U/L (ref 38–126)
Anion gap: 10 (ref 5–15)
BUN: 7 mg/dL (ref 6–20)
CO2: 21 mmol/L — ABNORMAL LOW (ref 22–32)
Calcium: 8.3 mg/dL — ABNORMAL LOW (ref 8.9–10.3)
Chloride: 102 mmol/L (ref 98–111)
Creatinine, Ser: 0.64 mg/dL (ref 0.44–1.00)
GFR, Estimated: >60 mL/min (ref >60)
Glucose, Bld: 71 mg/dL (ref 70–99)
Potassium: 3.4 mmol/L — ABNORMAL LOW (ref 3.5–5.1)
Sodium: 133 mmol/L — ABNORMAL LOW (ref 135–145)
Total Bilirubin: 0.6 mg/dL (ref 0.3–1.2)
Total Protein: 6.4 g/dL — ABNORMAL LOW (ref 6.5–8.1)

## 2022-12-05 LAB — RPR: RPR Ser Ql: NONREACTIVE

## 2022-12-05 LAB — TYPE AND SCREEN
ABO/RH(D): O POS
Antibody Screen: NEGATIVE

## 2022-12-05 LAB — PROTEIN / CREATININE RATIO, URINE
Creatinine, Urine: 51 mg/dL
Protein Creatinine Ratio: 0.2 mg/mg{Cre} — ABNORMAL HIGH (ref 0.00–0.15)
Total Protein, Urine: 10 mg/dL

## 2022-12-05 MED ORDER — LABETALOL HCL 5 MG/ML IV SOLN
40.0000 mg | INTRAVENOUS | Status: DC | PRN
  Filled 2022-12-05: qty 8

## 2022-12-05 MED ORDER — HYDRALAZINE HCL 20 MG/ML IJ SOLN: 10.0000 mg | INTRAMUSCULAR | Status: DC | PRN

## 2022-12-05 MED ORDER — LABETALOL HCL 100 MG PO TABS
200.0000 mg | ORAL_TABLET | Freq: Once | ORAL | Status: AC
  Administered 2022-12-05: 200 mg via ORAL
  Filled 2022-12-05: qty 2

## 2022-12-05 MED ORDER — LABETALOL HCL 5 MG/ML IV SOLN
20.0000 mg | INTRAVENOUS | Status: DC | PRN
  Administered 2022-12-05: 20 mg via INTRAVENOUS
  Filled 2022-12-05: qty 4

## 2022-12-05 MED ORDER — LABETALOL HCL 200 MG PO TABS: 600.0000 mg | ORAL_TABLET | Freq: Three times a day (TID) | ORAL | 3 refills | Status: DC

## 2022-12-05 MED ORDER — LABETALOL HCL 5 MG/ML IV SOLN: 80.0000 mg | INTRAVENOUS | Status: DC | PRN

## 2022-12-05 MED ORDER — ACETAMINOPHEN-CAFFEINE 500-65 MG PO TABS
2.0000 | ORAL_TABLET | Freq: Once | ORAL | Status: AC
  Administered 2022-12-05: 2 via ORAL
  Filled 2022-12-05: qty 2

## 2022-12-05 NOTE — Progress Notes (Signed)
Patient information  Patient Name: Brittany [name] [place]  Patient MRN:   [id number]  Referring practice: MFM Referring Provider: [facility]  MFM CONSULT  Brittany [name] is a 42 y.o. [id number] at [redacted]w[redacted]d here for ultrasound and consultation.  The patient was seen for a biophysical profile today.  The biophysical was 6 out of 8 with -2 points for fetal movement.  The NST was reactive giving her an overall biophysical profile of 8 out of 10.  However, her blood pressures were elevated at 166/96 with a repeat of 157/85.  The patient reports persistent headaches the past 2 to 3 weeks with some relief from analgesics.  She denies visual changes or right upper quadrant pain.  She reports sudden onset of vomiting last night that was spontaneous.  I discussed my concern for possible superimposed preeclampsia and the need for prolonged monitoring to assess blood pressure, labs and fetal wellbeing.  Sonographic findings Single intrauterine pregnancy. Fetal cardiac activity: Observed. Presentation: Cephalic. Interval fetal anatomy appears normal.   Amniotic fluid volume: Within normal limits. AFI: 13.14 cm.  MVP: 4.72 cm. Placenta: Anterior. BPP: 8/10.   Assessment -Chronic hypertension with possible superimposed preeclampsia Plan -Sent to MAU for evaluation.  At a minimum she will need at least 2 hours of blood pressure monitoring, preeclampsia labs and fetal monitoring.  I discussed the possibility of prolonged monitoring versus hospitalization pending her clinical course.  Please contact MFM with any concerns. -Consider reconsultation with neurology if headache does not improve with standard treatment methods  Review of Systems: A review of systems was performed and was negative except per HPI   Vitals and Physical Exam    12/05/2022    8:27 AM 12/05/2022    7:19 AM 11/28/2022    7:16 AM  Vitals with BMI  Systolic 157 166 956  Diastolic 85 96 84  Pulse  72 68  Sitting  comfortably on the NST bed Nonlabored breathing Normal rate and rhythm Abdomen is nontender  Past pregnancies OB History  Gravida Para Term Preterm AB Living  10 4 2 2 5 4   SAB IAB Ectopic Multiple Live Births  4 1 0 0 4    # Outcome Date GA Lbr Len/2nd Weight Sex Delivery Anes PTL Lv  10 Current           9 Preterm 06/13/18 [redacted]w[redacted]d 07:04 / 00:15 5 lb 1.1 oz (2.3 kg) F Vag-Spont EPI  LIV  8 Term 05/03/16 [redacted]w[redacted]d 00:08 / 00:01 4 lb 12.7 oz (2.175 kg) M Vag-Spont None  LIV  7 Preterm 04/06/11 [redacted]w[redacted]d 08:25 / 00:08 5 lb 6.6 oz (2.455 kg) F Vag-Spont None  LIV     Complications: PIH (pregnancy induced hypertension)  6 Term 10/21/05 [redacted]w[redacted]d    Vag-Spont   LIV     Complications: Failure to Progress in Second Stage  5 IAB           4 SAB           3 SAB           2 SAB           1 SAB               I spent 20 minutes reviewing the patients chart, including labs and images as well as counseling the patient about her medical conditions. Greater than 50% of the time was spent in direct face-to-face patient counseling.  Braxton Feathers  MFM, [facility]   12/05/2022  9:30 AM

## 2022-12-05 NOTE — Progress Notes (Signed)
PRENATAL VISIT NOTE  Subjective:  Brittany [name] is a 42 y.o. [id number] at [redacted]w[redacted]d being seen today for ongoing prenatal care.  She is currently monitored for the following issues for this high-risk pregnancy and has Chronic hypertension affecting pregnancy; Subclinical hyperthyroidism; Vitamin D deficiency; Supervision of high risk pregnancy, antepartum; Hx of preeclampsia, prior pregnancy, currently pregnant; and AMA (advanced maternal age) multigravida 35+ on their problem list.  Patient reports no complaints. Was seen at MFM and MAU previously today for evaluation of elevated BPs. Just wants cervical check now.  Contractions: Irritability.  .  Movement: Present. Denies leaking of fluid.   The following portions of the patient's history were reviewed and updated as appropriate: allergies, current medications, past family history, past medical history, past social history, past surgical history and problem list.   Objective:   Vitals:   12/05/22 1431  BP: 130/86  Pulse: 81    Fetal Status:     Movement: Present  Presentation: Vertex  General:  Alert, oriented and cooperative. Patient is in no acute distress.  Skin: Skin is warm and dry. No rash noted.   Cardiovascular: Normal heart rate noted  Respiratory: Normal respiratory effort, no problems with respiration noted  Abdomen: Soft, gravid, appropriate for gestational age.        Pelvic: Cervical exam performed in the presence of a chaperone Dilation: 1.5 Effacement (%): 60 Station: -3  Extremities: Normal range of motion.     Mental Status: Normal mood and affect. Normal behavior. Normal judgment and thought content.   [place] MFM FETAL BPP W/NONSTRESS  Result Date: 12/05/2022 ----------------------------------------------------------------------  OBSTETRICS REPORT                       (Signed Final 12/05/2022 09:36 am) ---------------------------------------------------------------------- Patient Info  ID #:       [id number]                           D.O.B.:  12/22/1980 (41 yrs)  Name:       Brittany [name]                Visit Date: 12/05/2022 07:24 am ---------------------------------------------------------------------- Performed By  Attending:        Braxton Feathers DO       Ref. Address:     [address]                                                             [place], [place]                                                             [address]  Performed By:     Marcellina Millin       [facility]  Referred By:      Federico Flake MD ---------------------------------------------------------------------- Orders  #  Description                           Code        Ordered By  1  [place] MFM FETAL BPP                      16109.6     Braxton Feathers     W/NONSTRESS ----------------------------------------------------------------------  #  Order #                     Accession #                Episode #  1  [id number]                   [id number]                 [id number] ---------------------------------------------------------------------- Indications  Hypertension - Chronic/Pre-existing            O10.019  (labetalol)  Advanced maternal age multigravida 35+,        O59.529  unspecified trimester (41 years)  Subclinical hypothyroidism in pregnancy        O99.280, E07.9  Poor obstetric history: Previous preterm       O09.219  delivery, antepartum  Poor obstetric history: Previous               O09.299  preeclampsia / eclampsia/gestational HTN  [redacted] weeks gestation of pregnancy                Z3A.34  LR NIPS/Neg AFP ---------------------------------------------------------------------- Vital Signs  BP:          166/96 ---------------------------------------------------------------------- Fetal  Evaluation  Num Of Fetuses:         1  Fetal Heart Rate(bpm):  138  Cardiac Activity:       Observed  Presentation:           Cephalic  Placenta:               Anterior  P. Cord Insertion:      Previously visualized  Amniotic Fluid  AFI FV:      Within normal limits  AFI Sum(cm)     %Tile       Largest Pocket(cm)  13.14           43          4.72  RUQ(cm)       RLQ(cm)  LUQ(cm)        LLQ(cm)  2.38          3.39          2.65           4.72 ---------------------------------------------------------------------- Biophysical Evaluation  Amniotic F.V:   Pocket => 2 cm             F. Tone:        Observed  F. Movement:    Not Observed               N.S.T:          Reactive  F. Breathing:   Observed                   Score:          8/10 ---------------------------------------------------------------------- OB History  Blood Type:   O+  Gravidity:    10        Term:   2        Prem:   2        SAB:   4  TOP:          1       Ectopic:  0        Living: 4 ---------------------------------------------------------------------- Gestational Age  LMP:           34w 4d        Date:  04/07/22                  EDD:   01/12/23  Best:          34w 4d     Det. By:  LMP  (04/07/22)          EDD:   01/12/23 ---------------------------------------------------------------------- Comments  MFM CONSULT  Brittany [name] is a 42 y.o. [id number] at [redacted]w[redacted]d here  for ultrasound and consultation.  The patient was seen for a biophysical profile today.  The  biophysical was 6 out of 8 with -2 points for fetal movement.  The NST was reactive giving her an overall biophysical profile  of 8 out of 10.  However, her blood pressures were elevated  at 166/96 with a repeat of 157/85.  The patient reports  persistent headaches the past 2 to 3 weeks with some relief  from analgesics.  She denies visual changes or right upper  quadrant pain.  She reports sudden onset of vomiting last  night that was spontaneous.  I discussed my concern for  possible  superimposed preeclampsia and the need for  prolonged monitoring to assess blood pressure, labs and  fetal wellbeing.  Sonographic findings  Single intrauterine pregnancy.  Fetal cardiac activity: Observed.  Presentation: Cephalic.  Interval fetal anatomy appears normal.  Amniotic fluid volume: Within normal limits. AFI: 13.14 cm.  MVP: 4.72 cm.  Placenta: Anterior.  BPP: 8/10.  Assessment  -Chronic hypertension with possible superimposed  preeclampsia  Plan  -Sent to MAU for evaluation.  At a minimum she will need at  least 2 hours of blood pressure monitoring, preeclampsia  labs and fetal monitoring.  I discussed the possibility of  prolonged monitoring versus hospitalization pending her  clinical course.  Please contact MFM with any concerns.  -Consider reconsultation with neurology if headache does not  improve with standard treatment methods ----------------------------------------------------------------------                  Braxton Feathers, DO Electronically Signed Final Report  12/05/2022 09:36 am ----------------------------------------------------------------------  Results for orders placed or performed during the hospital encounter of 12/05/22 (from the past 24 hour(s))  CBC     Status: Abnormal   Collection Time: 12/05/22  9:18 AM  Result Value Ref Range   WBC 9.1 4.0 - 10.5 K/uL   RBC 3.89 3.87 - 5.11 MIL/uL   Hemoglobin 10.7 (L) 12.0 - 15.0 g/dL   HCT 16.1 (L) 09.6 - 04.5 %   MCV 86.4 80.0 - 100.0 fL   MCH 27.5 26.0 - 34.0 pg   MCHC 31.8 30.0 - 36.0 g/dL   RDW 40.9 81.1 - 91.4 %   Platelets 201 150 - 400 K/uL   nRBC 0.0 0.0 - 0.2 %  Comprehensive metabolic panel     Status: Abnormal   Collection Time: 12/05/22  9:18 AM  Result Value Ref Range   Sodium 133 (L) 135 - 145 mmol/L   Potassium 3.4 (L) 3.5 - 5.1 mmol/L   Chloride 102 98 - 111 mmol/L   CO2 21 (L) 22 - 32 mmol/L   Glucose, Bld 71 70 - 99 mg/dL   BUN 7 6 - 20 mg/dL   Creatinine, Ser 7.82 0.44 - 1.00 mg/dL   Calcium 8.3  (L) 8.9 - 10.3 mg/dL   Total Protein 6.4 (L) 6.5 - 8.1 g/dL   Albumin 2.6 (L) 3.5 - 5.0 g/dL   AST 27 15 - 41 U/L   ALT 25 0 - 44 U/L   Alkaline Phosphatase 89 38 - 126 U/L   Total Bilirubin 0.6 0.3 - 1.2 mg/dL   GFR, Estimated >95 >62 mL/min   Anion gap 10 5 - 15  RPR     Status: None   Collection Time: 12/05/22  9:18 AM  Result Value Ref Range   RPR Ser Ql NON REACTIVE NON REACTIVE  Protein / creatinine ratio, urine     Status: Abnormal   Collection Time: 12/05/22  9:50 AM  Result Value Ref Range   Creatinine, Urine 51 mg/dL   Total Protein, Urine 10 mg/dL   Protein Creatinine Ratio 0.20 (H) 0.00 - 0.15 mg/mg[Cre]  Urinalysis, Routine w reflex microscopic -Urine, Clean Catch     Status: Abnormal   Collection Time: 12/05/22  9:50 AM  Result Value Ref Range   Color, Urine YELLOW YELLOW   APPearance CLEAR CLEAR   Specific Gravity, Urine 1.009 1.005 - 1.030   pH 7.0 5.0 - 8.0   Glucose, UA NEGATIVE NEGATIVE mg/dL   Hgb urine dipstick NEGATIVE NEGATIVE   Bilirubin Urine NEGATIVE NEGATIVE   Ketones, ur 20 (A) NEGATIVE mg/dL   Protein, ur NEGATIVE NEGATIVE mg/dL   Nitrite NEGATIVE NEGATIVE   Leukocytes,Ua NEGATIVE NEGATIVE  Type and screen [facility]     Status: None   Collection Time: 12/05/22  9:51 AM  Result Value Ref Range   ABO/RH(D) O POS    Antibody Screen NEG    Sample Expiration      12/08/2022,2359 Performed at [facility], [address], [place], [place] [address]     Assessment and Plan:  Pregnancy: [id number] at [redacted]w[redacted]d 1. Chronic hypertension affecting pregnancy 2. Hx of preeclampsia, prior pregnancy, currently pregnant Reviewed BPs. Increased Labetalol to 600 mg po tid. Normal labs today. PEC precautions reviewed. New BP cuff given.  Will continue weekly labs.  - labetalol (NORMODYNE) 200 MG tablet; Take 3 tablets (600 mg total) by mouth 3 (three) times daily.  Dispense: 180 tablet;  Refill: 3 - CBC; Future - Comprehensive metabolic  panel; Future - Protein / creatinine ratio, urine; Future  3. [redacted] weeks gestation of pregnancy 4. Supervision of high risk pregnancy, antepartum Preterm labor symptoms and general obstetric precautions including but not limited to vaginal bleeding, contractions, leaking of fluid and fetal movement were reviewed in detail with the patient. Please refer to After Visit Summary for other counseling recommendations.   Return in about 8 days (around 12/13/2022) for Lab appointment after MFM ultrasound.  Future Appointments  Date Time Provider Department Center  12/13/2022  9:30 AM [facility] NURSE [facility]  12/13/2022  9:45 AM [facility] NST [facility]  12/13/2022 10:30 AM [facility] LAB [facility]  12/19/2022  7:45 AM [facility] NURSE [facility]  12/19/2022  8:00 AM [facility] US1 [facility]  12/19/2022  2:30 PM [place] Bing, MD [facility]  12/26/2022  2:30 PM [place] Bing, MD [facility]  01/02/2023  2:30 PM Reva Bores, MD [facility]  01/09/2023  2:30 PM Doyt Castellana, Jethro Bastos, MD [facility]    Jaynie Collins, MD

## 2022-12-05 NOTE — Procedures (Signed)
Brittany [name] [birth year] [redacted]w[redacted]d  Fetus A Non-Stress Test Interpretation for 12/05/22  Indication: Chronic Hypertenstion and Hx IUFD  Fetal Heart Rate A Mode: External Baseline Rate (A): 135 bpm Variability: Moderate Accelerations: 15 x 15 Decelerations: None Multiple birth?: No  Uterine Activity Mode: Palpation, Toco Contraction Frequency (min): Occas Contraction Quality: Mild Resting Tone Palpated: Relaxed Resting Time: Adequate  Interpretation (Fetal Testing) Nonstress Test Interpretation: Reactive Comments: Dr. Darra Lis reviewed tracing.

## 2022-12-05 NOTE — Progress Notes (Signed)
Pt requesting cervical check

## 2022-12-05 NOTE — MAU Provider Note (Addendum)
History    Chief Complaint  Patient presents with   Decreased Fetal Movement   Hypertension   Headache   Vaginal Pressure   HPI Patient presenting after MFM visit for severe range elevation of blood pressure and evaluation.  Patient endorses 7/10 headache, with "grey spots" in vision. Known chronic migraines, but does not typically have aura. Reports ankle edema that started yesterday and is now resolved. She has not taken home labetalol since this morning.   History of chronic hypertension with superimposed preeclampsia during pregnancy. Patient reports last pregnancy was an IOL for pre-eclampsia and severe range BP @ 35 weeks.  Reports fetal movement, however slightly decreased.   Denies loss of fluid, vaginal bleeding, abdominal pain, back pain.  OB History     Gravida  10   Para  4   Term  2   Preterm  2   AB  5   Living  4      SAB  4   IAB  1   Ectopic  0   Multiple  0   Live Births  4           Past Medical History:  Diagnosis Date   Anemia 11/30/2016   Gallstone    Headache(784.0)    Heart murmur    Hypertension    Infection    UTI   Maternal chronic hypertension in first trimester 04/09/2011   Migraine 03/18/2016   Needs referral to headache specialist   Murmur, cardiac 11/30/2016   Cardiology evaluation: normal 12/2017   Pregnancy induced hypertension    after 2nd delivery   Vaginal Pap smear, abnormal    cant remember    Past Surgical History:  Procedure Laterality Date   DILATION AND CURETTAGE OF UTERUS     dont remember year   LEEP      Family History  Problem Relation Age of Onset   Asthma Mother    Hypertension Mother    CAD Mother    Asthma Maternal Grandmother    Hypertension Maternal Grandmother     Social History   Tobacco Use   Smoking status: Never   Smokeless tobacco: Never  Vaping Use   Vaping Use: Never used  Substance Use Topics   Alcohol use: No    Alcohol/week: 0.0 standard drinks of alcohol    Drug use: No    Allergies: No Known Allergies  Medications Prior to Admission  Medication Sig Dispense Refill Last Dose   aspirin EC 81 MG tablet Take 1 tablet (81 mg total) by mouth daily. Take after 12 weeks for prevention of preeclampsia later in pregnancy 300 tablet 2 12/04/2022   labetalol (NORMODYNE) 200 MG tablet Take 2 tablets (400 mg total) by mouth 3 (three) times daily. 120 tablet 3 12/04/2022 at 2100   Butalbital-APAP-Caffeine (FIORICET) 50-300-40 MG CAPS Take 1-2 capsules by mouth every 6 (six) hours as needed (severe headache). 20 capsule 0    cyclobenzaprine (FLEXERIL) 10 MG tablet Take 1 tablet (10 mg total) by mouth 3 (three) times daily as needed for muscle spasms (headaches). 30 tablet 2    prenatal vitamin w/FE, FA (PRENATAL 1 + 1) 27-1 MG TABS tablet Take 1 tablet by mouth daily at 12 noon. 30 tablet 11    promethazine (PHENERGAN) 25 MG tablet Take 1 tablet (25 mg total) by mouth every 6 (six) hours as needed for nausea or vomiting. 30 tablet 0    ROS: Per HPI  Vitals Blood pressure Marland Kitchen)  153/88, pulse 63, temperature 98 F (36.7 C), temperature source Oral, resp. rate 18, height 5\' 5"  (1.651 m), weight 81.7 kg, last menstrual period 04/07/2022, SpO2 99 %, currently breastfeeding.  Physical Exam Constitutional:      General: She is not in acute distress. HENT:     Head: Normocephalic and atraumatic.  Eyes:     General: No scleral icterus.    Extraocular Movements: Extraocular movements intact.     Pupils: Pupils are equal.  Cardiovascular:     Rate and Rhythm: Normal rate and regular rhythm.  Pulmonary:     Effort: Pulmonary effort is normal. No respiratory distress.     Breath sounds: Normal breath sounds.  Skin:    General: Skin is warm and dry.  Neurological:     Mental Status: She is alert and oriented to person, place, and time.    MAU Course No procedures.  MDM  0915 Obtained CBC, CMP, urine protein upon arrival.  Started 200mg  po labetalol.  Initiated blood pressure checks every 15 minutes. Excedrin given for headache. BPP 8/10 @ MFM today, placed on continuous monitoring.   Tracing is reactive, baseline 145, moderate variability, accelerations present, decelerations absent, contractions ~9-10 min apart.  1100 Headache improving. Blood pressure 160/92>150/91>152/95 with home labetolol. CBC shows hgb 10.7, plt 201. UA negative for hgb, protein, c/f infection. CMP unremarkable.  Protein/Cr ratio 0.20.   1245 Dosed additional 200 mg labetalol dose for full home dose. Headache improved. Fetal tracing remains reactive. Gave patient diet prior to D/C. Low c/f PE w SF. Likely chronic HTN. D/C home with home labetalol. Recommend OP f/u. No urgent need for admission and delivery.  Orders Placed This Encounter  Procedures   CBC   Comprehensive metabolic panel   Protein / creatinine ratio, urine   RPR   Urinalysis, Routine w reflex microscopic -Urine, Clean Catch   Notify physician (specify) Confirmatory reading of BP> 160/110 15 minutes later   Apply Hypertensive Disorders of Pregnancy Care Plan   Measure blood pressure   Measure blood pressure   Type and screen [facility]   Insert peripheral IV   Saline lock IV   Discharge patient   Results for orders placed or performed during the hospital encounter of 12/05/22 (from the past 24 hour(s))  CBC     Status: Abnormal   Collection Time: 12/05/22  9:18 AM  Result Value Ref Range   WBC 9.1 4.0 - 10.5 K/uL   RBC 3.89 3.87 - 5.11 MIL/uL   Hemoglobin 10.7 (L) 12.0 - 15.0 g/dL   HCT 84.6 (L) 96.2 - 95.2 %   MCV 86.4 80.0 - 100.0 fL   MCH 27.5 26.0 - 34.0 pg   MCHC 31.8 30.0 - 36.0 g/dL   RDW 84.1 32.4 - 40.1 %   Platelets 201 150 - 400 K/uL   nRBC 0.0 0.0 - 0.2 %  Comprehensive metabolic panel     Status: Abnormal   Collection Time: 12/05/22  9:18 AM  Result Value Ref Range   Sodium 133 (L) 135 - 145 mmol/L   Potassium 3.4 (L) 3.5 - 5.1 mmol/L   Chloride 102  98 - 111 mmol/L   CO2 21 (L) 22 - 32 mmol/L   Glucose, Bld 71 70 - 99 mg/dL   BUN 7 6 - 20 mg/dL   Creatinine, Ser 0.27 0.44 - 1.00 mg/dL   Calcium 8.3 (L) 8.9 - 10.3 mg/dL   Total Protein 6.4 (L) 6.5 -  8.1 g/dL   Albumin 2.6 (L) 3.5 - 5.0 g/dL   AST 27 15 - 41 U/L   ALT 25 0 - 44 U/L   Alkaline Phosphatase 89 38 - 126 U/L   Total Bilirubin 0.6 0.3 - 1.2 mg/dL   GFR, Estimated >16 >10 mL/min   Anion gap 10 5 - 15  Protein / creatinine ratio, urine     Status: Abnormal   Collection Time: 12/05/22  9:50 AM  Result Value Ref Range   Creatinine, Urine 51 mg/dL   Total Protein, Urine 10 mg/dL   Protein Creatinine Ratio 0.20 (H) 0.00 - 0.15 mg/mg[Cre]  Urinalysis, Routine w reflex microscopic -Urine, Clean Catch     Status: Abnormal   Collection Time: 12/05/22  9:50 AM  Result Value Ref Range   Color, Urine YELLOW YELLOW   APPearance CLEAR CLEAR   Specific Gravity, Urine 1.009 1.005 - 1.030   pH 7.0 5.0 - 8.0   Glucose, UA NEGATIVE NEGATIVE mg/dL   Hgb urine dipstick NEGATIVE NEGATIVE   Bilirubin Urine NEGATIVE NEGATIVE   Ketones, ur 20 (A) NEGATIVE mg/dL   Protein, ur NEGATIVE NEGATIVE mg/dL   Nitrite NEGATIVE NEGATIVE   Leukocytes,Ua NEGATIVE NEGATIVE  Type and screen [facility]     Status: None   Collection Time: 12/05/22  9:51 AM  Result Value Ref Range   ABO/RH(D) O POS    Antibody Screen NEG    Sample Expiration      12/08/2022,2359 Performed at [facility], [address], [place], [place] [address]     Assessment & Plan --42 y.o. [phone] at [redacted]w[redacted]d  --Reactive tracing --Endorsing fetal movement on arrival to MAU --Cat I tracing --CHTN, severe 2/2 missing morning dose of medication --PEC labs WNL, P:CR 0.20 --Headache resolved with PO medication, rest and food --Care and labs reviewed with Dr. Donavan Foil, who agrees patient is appropriate for discharge --Discharge home in stable condition with PEC precautions  F/U: --Patient states she has a  prenatal appointment this afternoon  Clayton Bibles, MSA, MSN, CNM Certified Nurse Midwife, Biochemist, clinical for [employer], [facility]

## 2022-12-05 NOTE — MAU Note (Signed)
...  Brittany [name] is a 42 y.o. at [redacted]w[redacted]d here in MAU reporting: Sent over from MFM for BPP 8/10 for movements. She reports she has had DFM for the past "day or two." Endorses a current HA that began last night. She reports she took 1000 mg of Tylenol around 2300. Endorses visual floaters and swelling of her right ankle. Denies RUQ/epigastric pain. Reports she has had an increase in vaginal pressure for "a day or two." Denies VB. Reports "dripping" brown fluids since this morning. Denies recent IC, vaginal itching, and vaginal odors.  Last took 200 mg Labetalol last night around 2200.  Pain score:  7/10 anterior HA 7/10 vagina    FHT: 144 initial external Lab orders placed from triage:  UA

## 2022-12-05 NOTE — Progress Notes (Signed)
Patient has not taken Labetalol this morning. Last dose was last night. C/O HA's which are not new, stating she has a mild HA currently. Denies visual changes or RUQ pain. States she was vomiting last night. Patient sent to MAU for further assessment of elevated BP's.

## 2022-12-06 ENCOUNTER — Encounter: Payer: Self-pay | Admitting: Obstetrics and Gynecology

## 2022-12-06 ENCOUNTER — Other Ambulatory Visit (HOSPITAL_COMMUNITY): Payer: Self-pay | Admitting: General Surgery

## 2022-12-06 DIAGNOSIS — E041 Nontoxic single thyroid nodule: Secondary | ICD-10-CM

## 2022-12-07 ENCOUNTER — Ambulatory Visit: Payer: PRIVATE HEALTH INSURANCE

## 2022-12-08 ENCOUNTER — Encounter: Payer: Self-pay | Admitting: Obstetrics & Gynecology

## 2022-12-08 ENCOUNTER — Inpatient Hospital Stay (HOSPITAL_COMMUNITY)
Admission: AD | Admit: 2022-12-08 | Discharge: 2022-12-08 | Disposition: A | Discharge status: Home / Self Care | Payer: PRIVATE HEALTH INSURANCE | Attending: Obstetrics and Gynecology | Admitting: Obstetrics and Gynecology

## 2022-12-08 ENCOUNTER — Encounter (HOSPITAL_COMMUNITY): Payer: Self-pay | Admitting: Obstetrics and Gynecology

## 2022-12-08 DIAGNOSIS — R519 Headache, unspecified: Secondary | ICD-10-CM | POA: Insufficient documentation

## 2022-12-08 DIAGNOSIS — Z3A35 35 weeks gestation of pregnancy: Secondary | ICD-10-CM | POA: Insufficient documentation

## 2022-12-08 DIAGNOSIS — O10913 Unspecified pre-existing hypertension complicating pregnancy, third trimester: Secondary | ICD-10-CM | POA: Diagnosis not present

## 2022-12-08 DIAGNOSIS — O10919 Unspecified pre-existing hypertension complicating pregnancy, unspecified trimester: Secondary | ICD-10-CM

## 2022-12-08 DIAGNOSIS — O26893 Other specified pregnancy related conditions, third trimester: Secondary | ICD-10-CM | POA: Diagnosis not present

## 2022-12-08 DIAGNOSIS — O10013 Pre-existing essential hypertension complicating pregnancy, third trimester: Secondary | ICD-10-CM | POA: Insufficient documentation

## 2022-12-08 LAB — CBC
HCT: 32 % — ABNORMAL LOW (ref 36.0–46.0)
Hemoglobin: 10.7 g/dL — ABNORMAL LOW (ref 12.0–15.0)
MCH: 27.7 pg (ref 26.0–34.0)
MCHC: 33.4 g/dL (ref 30.0–36.0)
MCV: 82.9 fL (ref 80.0–100.0)
Platelets: 232 10*3/uL (ref 150–400)
RBC: 3.86 MIL/uL — ABNORMAL LOW (ref 3.87–5.11)
RDW: 13.7 % (ref 11.5–15.5)
WBC: 9.7 10*3/uL (ref 4.0–10.5)
nRBC: 0 % (ref 0.0–0.2)

## 2022-12-08 LAB — COMPREHENSIVE METABOLIC PANEL
ALT: 25 U/L (ref 0–44)
AST: 31 U/L (ref 15–41)
Albumin: 2.7 g/dL — ABNORMAL LOW (ref 3.5–5.0)
Alkaline Phosphatase: 93 U/L (ref 38–126)
Anion gap: 10 (ref 5–15)
BUN: <5 mg/dL — ABNORMAL LOW (ref 6–20)
CO2: 19 mmol/L — ABNORMAL LOW (ref 22–32)
Calcium: 8.5 mg/dL — ABNORMAL LOW (ref 8.9–10.3)
Chloride: 105 mmol/L (ref 98–111)
Creatinine, Ser: 0.62 mg/dL (ref 0.44–1.00)
GFR, Estimated: >60 mL/min (ref >60)
Glucose, Bld: 78 mg/dL (ref 70–99)
Potassium: 3.3 mmol/L — ABNORMAL LOW (ref 3.5–5.1)
Sodium: 134 mmol/L — ABNORMAL LOW (ref 135–145)
Total Bilirubin: 0.4 mg/dL (ref 0.3–1.2)
Total Protein: 6.6 g/dL (ref 6.5–8.1)

## 2022-12-08 LAB — URINALYSIS, ROUTINE W REFLEX MICROSCOPIC
Bilirubin Urine: NEGATIVE
Glucose, UA: NEGATIVE mg/dL
Hgb urine dipstick: NEGATIVE
Ketones, ur: NEGATIVE mg/dL
Leukocytes,Ua: NEGATIVE
Nitrite: NEGATIVE
Protein, ur: NEGATIVE mg/dL
Specific Gravity, Urine: 1.008 (ref 1.005–1.030)
pH: 7 (ref 5.0–8.0)

## 2022-12-08 LAB — PROTEIN / CREATININE RATIO, URINE
Creatinine, Urine: 45 mg/dL
Protein Creatinine Ratio: 0.16 mg/mg{Cre} — ABNORMAL HIGH (ref 0.00–0.15)
Total Protein, Urine: 7 mg/dL

## 2022-12-08 MED ORDER — METOCLOPRAMIDE HCL 10 MG PO TABS
10.0000 mg | ORAL_TABLET | Freq: Once | ORAL | Status: AC
  Administered 2022-12-08: 10 mg via ORAL
  Filled 2022-12-08: qty 1

## 2022-12-08 MED ORDER — ACETAMINOPHEN-CAFFEINE 500-65 MG PO TABS
2.0000 | ORAL_TABLET | Freq: Once | ORAL | Status: AC
  Administered 2022-12-08: 2 via ORAL
  Filled 2022-12-08: qty 2

## 2022-12-08 MED ORDER — LABETALOL HCL 100 MG PO TABS
600.0000 mg | ORAL_TABLET | Freq: Once | ORAL | Status: AC
  Administered 2022-12-08: 600 mg via ORAL
  Filled 2022-12-08: qty 6

## 2022-12-08 NOTE — MAU Provider Note (Signed)
History     [id number]  Arrival date and time: 12/08/22 1956    Chief Complaint  Patient presents with   Hypertension     HPI Brittany [name] is a 42 y.o. [id number] at [redacted]w[redacted]d by LMP who presents for hypertension. Has chronic hypertension. Meds recently increased to labetalol 600 TID. Took 400 mg this morning & none since then. States she doesn't think the medication is working so didn't take it as prescribed today. Has been having headaches throughout the pregnancy. Current headache is 7/10. Hasn't treated symptoms. No visual disturbance or epigastric pain. Has been having contractions that became more frequent since arriving to MAU. Denies dysuria, vaginal bleeding, or LOF. Good fetal movement.    --/--/O POS (05/20 0951)  OB History     Gravida  10   Para  4   Term  2   Preterm  2   AB  5   Living  4      SAB  4   IAB  1   Ectopic  0   Multiple  0   Live Births  4           Past Medical History:  Diagnosis Date   Anemia 11/30/2016   Gallstone    Headache(784.0)    Heart murmur    Hypertension    Infection    UTI   Maternal chronic hypertension in first trimester 04/09/2011   Migraine 03/18/2016   Needs referral to headache specialist   Murmur, cardiac 11/30/2016   Cardiology evaluation: normal 12/2017   Pregnancy induced hypertension    after 2nd delivery   Vaginal Pap smear, abnormal    cant remember    Past Surgical History:  Procedure Laterality Date   DILATION AND CURETTAGE OF UTERUS     dont remember year   LEEP      Family History  Problem Relation Age of Onset   Asthma Mother    Hypertension Mother    CAD Mother    Asthma Maternal Grandmother    Hypertension Maternal Grandmother     Social History   Socioeconomic History   Marital status: Married    Spouse name: Not on file   Number of children: Not on file   Years of education: Not on file   Highest education level: Not on file  Occupational History   Not on file   Tobacco Use   Smoking status: Never   Smokeless tobacco: Never  Vaping Use   Vaping Use: Never used  Substance and Sexual Activity   Alcohol use: No    Alcohol/week: 0.0 standard drinks of alcohol   Drug use: No   Sexual activity: Yes  Other Topics Concern   Not on file  Social History Narrative   Not on file   Social Determinants of Health   Financial Resource Strain: Not on file  Food Insecurity: Not on file  Transportation Needs: Not on file  Physical Activity: Not on file  Stress: Not on file  Social Connections: Not on file  Intimate Partner Violence: Not on file    No Known Allergies  No current facility-administered medications on file prior to encounter.   Current Outpatient Medications on File Prior to Encounter  Medication Sig Dispense Refill   aspirin EC 81 MG tablet Take 1 tablet (81 mg total) by mouth daily. Take after 12 weeks for prevention of preeclampsia later in pregnancy 300 tablet 2   labetalol (NORMODYNE) 200 MG tablet Take 3  tablets (600 mg total) by mouth 3 (three) times daily. 180 tablet 3   Butalbital-APAP-Caffeine (FIORICET) 50-300-40 MG CAPS Take 1-2 capsules by mouth every 6 (six) hours as needed (severe headache). 20 capsule 0   cyclobenzaprine (FLEXERIL) 10 MG tablet Take 1 tablet (10 mg total) by mouth 3 (three) times daily as needed for muscle spasms (headaches). 30 tablet 2   prenatal vitamin w/FE, FA (PRENATAL 1 + 1) 27-1 MG TABS tablet Take 1 tablet by mouth daily at 12 noon. 30 tablet 11   promethazine (PHENERGAN) 25 MG tablet Take 1 tablet (25 mg total) by mouth every 6 (six) hours as needed for nausea or vomiting. 30 tablet 0   [DISCONTINUED] cetirizine (ZYRTEC ALLERGY) 10 MG tablet Take 1 tablet (10 mg total) by mouth daily. 30 tablet 2   [DISCONTINUED] ipratropium (ATROVENT) 0.06 % nasal spray Place 2 sprays into both nostrils 4 (four) times daily. 15 mL 0     ROS Pertinent positives and negative per HPI, all others reviewed and  negative  Physical Exam   BP 136/84   Pulse 77   Temp 98.5 F (36.9 C) (Oral)   Resp 18   Ht 5\' 5"  (1.651 m)   Wt 82.3 kg   LMP 04/07/2022 (Exact Date)   SpO2 99%   BMI 30.19 kg/m   Patient Vitals for the past 24 hrs:  BP Temp Temp src Pulse Resp SpO2 Height Weight  12/08/22 2221 136/84 -- -- 77 -- -- -- --  12/08/22 2201 (!) 142/85 -- -- 84 -- -- -- --  12/08/22 2146 (!) 150/93 -- -- 77 -- -- -- --  12/08/22 2131 (!) 169/92 -- -- 76 -- -- -- --  12/08/22 2115 (!) 164/102 -- -- 78 -- -- -- --  12/08/22 2101 (!) 182/101 -- -- 75 -- -- -- --  12/08/22 2052 (!) 166/95 -- -- 77 -- -- -- --  12/08/22 2042 (!) 160/94 -- -- 84 -- -- -- --  12/08/22 2019 (!) 165/94 98.5 F (36.9 C) Oral 78 18 99 % 5\' 5"  (1.651 m) 82.3 kg    Physical Exam Vitals and nursing note reviewed. Exam conducted with a chaperone present.  Constitutional:      General: She is not in acute distress.    Appearance: Normal appearance. She is not ill-appearing.  HENT:     Head: Normocephalic and atraumatic.  Eyes:     General: No scleral icterus.    Pupils: Pupils are equal, round, and reactive to light.  Pulmonary:     Effort: Pulmonary effort is normal. No respiratory distress.  Abdominal:     Tenderness: There is no abdominal tenderness.     Comments: gravid  Musculoskeletal:     Right lower leg: 1+ Pitting Edema present.     Left lower leg: 1+ Pitting Edema present.  Skin:    General: Skin is warm and dry.  Neurological:     Mental Status: She is alert.     Deep Tendon Reflexes:     Reflex Scores:      Patellar reflexes are 2+ on the left side.    Comments: No clonus      Cervical Exam Dilation: 1 Effacement (%): Thick Cervical Position: Posterior Station: Ballotable Exam by:: Judeth Horn NP   FHT Baseline 140, moderate variability, 15x15 accels, no decels Toco: Q 6-15 mins Cat: 1  Labs Results for orders placed or performed during the hospital encounter of 12/08/22 (from the  past 24 hour(s))  Urinalysis, Routine w reflex microscopic -Urine, Clean Catch     Status: None   Collection Time: 12/08/22  8:16 PM  Result Value Ref Range   Color, Urine YELLOW YELLOW   APPearance CLEAR CLEAR   Specific Gravity, Urine 1.008 1.005 - 1.030   pH 7.0 5.0 - 8.0   Glucose, UA NEGATIVE NEGATIVE mg/dL   Hgb urine dipstick NEGATIVE NEGATIVE   Bilirubin Urine NEGATIVE NEGATIVE   Ketones, ur NEGATIVE NEGATIVE mg/dL   Protein, ur NEGATIVE NEGATIVE mg/dL   Nitrite NEGATIVE NEGATIVE   Leukocytes,Ua NEGATIVE NEGATIVE  Protein / creatinine ratio, urine     Status: Abnormal   Collection Time: 12/08/22  8:29 PM  Result Value Ref Range   Creatinine, Urine 45 mg/dL   Total Protein, Urine 7 mg/dL   Protein Creatinine Ratio 0.16 (H) 0.00 - 0.15 mg/mg[Cre]  CBC     Status: Abnormal   Collection Time: 12/08/22  8:37 PM  Result Value Ref Range   WBC 9.7 4.0 - 10.5 K/uL   RBC 3.86 (L) 3.87 - 5.11 MIL/uL   Hemoglobin 10.7 (L) 12.0 - 15.0 g/dL   HCT 09.8 (L) 11.9 - 14.7 %   MCV 82.9 80.0 - 100.0 fL   MCH 27.7 26.0 - 34.0 pg   MCHC 33.4 30.0 - 36.0 g/dL   RDW 82.9 56.2 - 13.0 %   Platelets 232 150 - 400 K/uL   nRBC 0.0 0.0 - 0.2 %  Comprehensive metabolic panel     Status: Abnormal   Collection Time: 12/08/22  8:37 PM  Result Value Ref Range   Sodium 134 (L) 135 - 145 mmol/L   Potassium 3.3 (L) 3.5 - 5.1 mmol/L   Chloride 105 98 - 111 mmol/L   CO2 19 (L) 22 - 32 mmol/L   Glucose, Bld 78 70 - 99 mg/dL   BUN <5 (L) 6 - 20 mg/dL   Creatinine, Ser 8.65 0.44 - 1.00 mg/dL   Calcium 8.5 (L) 8.9 - 10.3 mg/dL   Total Protein 6.6 6.5 - 8.1 g/dL   Albumin 2.7 (L) 3.5 - 5.0 g/dL   AST 31 15 - 41 U/L   ALT 25 0 - 44 U/L   Alkaline Phosphatase 93 38 - 126 U/L   Total Bilirubin 0.4 0.3 - 1.2 mg/dL   GFR, Estimated >78 >46 mL/min   Anion gap 10 5 - 15    Imaging No results found.  MAU Course  Procedures Lab Orders         Urinalysis, Routine w reflex microscopic -Urine, Clean Catch          CBC         Comprehensive metabolic panel         Protein / creatinine ratio, urine    Meds ordered this encounter  Medications   labetalol (NORMODYNE) tablet 600 mg   acetaminophen-caffeine (EXCEDRIN TENSION HEADACHE) 500-65 MG per tablet 2 tablet   metoCLOPramide (REGLAN) tablet 10 mg   Imaging Orders  No imaging studies ordered today    MDM moderate  Assessment and Plan   1. Chronic hypertension affecting pregnancy   2. Pregnancy headache in third trimester   3. [redacted] weeks gestation of pregnancy    -Patient with known CHTN presents with hypertension & headache. Has preexisting headache. Hadn't treated current headache prior to arrival. Pain improved with meds given in MAU. Initially BPs were in severe range. Patient admits to not taking her labetalol as  prescribed. Last dose was 400 mg this morning & told nurses she's taking 400 BID. Patient supposed to be taking 600 TID. Given evening dose of labetalol here in MAU with good response. Preeclampsia labs normal. Reviewed with Dr. Donavan Foil - ok for discharge home. Patient has f/u on Tuesday.  -Strict preeclampsia return precautions.   #FWB: Cat 1 tracing    Dispo: discharged to home in stable condition.   Discharge Instructions     Discharge patient   Complete by: As directed    Discharge disposition: 01-Home or Self Care   Discharge patient date: 12/08/2022       Judeth Horn, NP 12/08/22 11:34 PM  Allergies as of 12/08/2022   No Known Allergies      Medication List     TAKE these medications    aspirin EC 81 MG tablet Take 1 tablet (81 mg total) by mouth daily. Take after 12 weeks for prevention of preeclampsia later in pregnancy   Butalbital-APAP-Caffeine 50-300-40 MG Caps Commonly known as: Fioricet Take 1-2 capsules by mouth every 6 (six) hours as needed (severe headache).   cyclobenzaprine 10 MG tablet Commonly known as: FLEXERIL Take 1 tablet (10 mg total) by mouth 3 (three) times daily as  needed for muscle spasms (headaches).   labetalol 200 MG tablet Commonly known as: NORMODYNE Take 3 tablets (600 mg total) by mouth 3 (three) times daily.   prenatal vitamin w/FE, FA 27-1 MG Tabs tablet Take 1 tablet by mouth daily at 12 noon.   promethazine 25 MG tablet Commonly known as: PHENERGAN Take 1 tablet (25 mg total) by mouth every 6 (six) hours as needed for nausea or vomiting.

## 2022-12-08 NOTE — MAU Note (Addendum)
.  Brittany [name] is a 42 y.o. at [redacted]w[redacted]d here in MAU reporting: ongoing vaginal pressure, elevated BP of  159/88 one hour ago, HA, swelling and soreness in her feet and ankle that is intermittent. Pt states she works and is on her feet all day. Pt states takes her bp medications regularly, Labetalol 400mg  BID last dose 0700 this am. Pt denies taking pain medications. Pt endorses FM. Pt denies VB or LOF, recent intercourse.  CHTN on medications Onset of complaint: ongoing  Pain score: 7/10 HA, 8/10 feet, 7/10 vagina  Vitals:   12/08/22 2019  BP: (!) 165/94  Pulse: 78  Resp: 18  Temp: 98.5 F (36.9 C)  SpO2: 99%     FHT:135 Lab orders placed from triage:  UA

## 2022-12-12 ENCOUNTER — Encounter: Payer: Self-pay | Admitting: Obstetrics and Gynecology

## 2022-12-12 ENCOUNTER — Inpatient Hospital Stay (HOSPITAL_COMMUNITY)
Admission: AD | Admit: 2022-12-12 | Discharge: 2022-12-15 | DRG: 807 | Disposition: A | Discharge status: Home / Self Care | Payer: PRIVATE HEALTH INSURANCE | Attending: Obstetrics and Gynecology | Admitting: Obstetrics and Gynecology

## 2022-12-12 ENCOUNTER — Encounter (HOSPITAL_COMMUNITY): Payer: Self-pay | Admitting: Obstetrics & Gynecology

## 2022-12-12 DIAGNOSIS — Z3A35 35 weeks gestation of pregnancy: Secondary | ICD-10-CM

## 2022-12-12 DIAGNOSIS — O1413 Severe pre-eclampsia, third trimester: Secondary | ICD-10-CM | POA: Diagnosis present

## 2022-12-12 DIAGNOSIS — O114 Pre-existing hypertension with pre-eclampsia, complicating childbirth: Principal | ICD-10-CM | POA: Diagnosis present

## 2022-12-12 DIAGNOSIS — O0990 Supervision of high risk pregnancy, unspecified, unspecified trimester: Secondary | ICD-10-CM

## 2022-12-12 DIAGNOSIS — O10919 Unspecified pre-existing hypertension complicating pregnancy, unspecified trimester: Secondary | ICD-10-CM | POA: Diagnosis present

## 2022-12-12 DIAGNOSIS — O09529 Supervision of elderly multigravida, unspecified trimester: Secondary | ICD-10-CM

## 2022-12-12 DIAGNOSIS — R030 Elevated blood-pressure reading, without diagnosis of hypertension: Secondary | ICD-10-CM | POA: Diagnosis not present

## 2022-12-12 DIAGNOSIS — O1092 Unspecified pre-existing hypertension complicating childbirth: Secondary | ICD-10-CM | POA: Diagnosis present

## 2022-12-12 LAB — URINALYSIS, ROUTINE W REFLEX MICROSCOPIC
Bilirubin Urine: NEGATIVE
Glucose, UA: NEGATIVE mg/dL
Hgb urine dipstick: NEGATIVE
Ketones, ur: 20 mg/dL — AB
Leukocytes,Ua: NEGATIVE
Nitrite: NEGATIVE
Protein, ur: NEGATIVE mg/dL
Specific Gravity, Urine: 1.012 (ref 1.005–1.030)
pH: 6 (ref 5.0–8.0)

## 2022-12-12 MED ORDER — MAGNESIUM SULFATE BOLUS VIA INFUSION
4.0000 g | Freq: Once | INTRAVENOUS | Status: AC
  Administered 2022-12-13: 4 g via INTRAVENOUS
  Filled 2022-12-12: qty 1000

## 2022-12-12 MED ORDER — ONDANSETRON 4 MG PO TBDP
4.0000 mg | ORAL_TABLET | Freq: Once | ORAL | Status: AC
  Administered 2022-12-12: 4 mg via ORAL
  Filled 2022-12-12: qty 1

## 2022-12-12 MED ORDER — LABETALOL HCL 5 MG/ML IV SOLN: 20.0000 mg | INTRAVENOUS | Status: DC | PRN

## 2022-12-12 MED ORDER — ACETAMINOPHEN 325 MG PO TABS
650.0000 mg | ORAL_TABLET | Freq: Once | ORAL | Status: AC
  Administered 2022-12-12: 650 mg via ORAL
  Filled 2022-12-12: qty 2

## 2022-12-12 MED ORDER — LABETALOL HCL 5 MG/ML IV SOLN: 40.0000 mg | INTRAVENOUS | Status: DC | PRN

## 2022-12-12 MED ORDER — LABETALOL HCL 5 MG/ML IV SOLN
20.0000 mg | INTRAVENOUS | Status: DC | PRN
  Administered 2022-12-13: 20 mg via INTRAVENOUS
  Filled 2022-12-12 (×2): qty 4

## 2022-12-12 MED ORDER — HYDRALAZINE HCL 20 MG/ML IJ SOLN
10.0000 mg | INTRAMUSCULAR | Status: DC | PRN
  Administered 2022-12-13: 10 mg via INTRAVENOUS
  Filled 2022-12-12: qty 1

## 2022-12-12 MED ORDER — LABETALOL HCL 5 MG/ML IV SOLN
80.0000 mg | INTRAVENOUS | Status: DC | PRN
  Administered 2022-12-13: 80 mg via INTRAVENOUS
  Filled 2022-12-12: qty 16

## 2022-12-12 MED ORDER — HYDRALAZINE HCL 20 MG/ML IJ SOLN: 10.0000 mg | INTRAMUSCULAR | Status: DC | PRN

## 2022-12-12 MED ORDER — LACTATED RINGERS IV SOLN: INTRAVENOUS | Status: DC

## 2022-12-12 MED ORDER — LABETALOL HCL 5 MG/ML IV SOLN: 80.0000 mg | INTRAVENOUS | Status: DC | PRN

## 2022-12-12 MED ORDER — LABETALOL HCL 5 MG/ML IV SOLN
40.0000 mg | INTRAVENOUS | Status: DC | PRN
  Administered 2022-12-13: 40 mg via INTRAVENOUS
  Filled 2022-12-12: qty 8

## 2022-12-12 MED ORDER — MAGNESIUM SULFATE 40 GM/1000ML IV SOLN
2.0000 g/h | INTRAVENOUS | Status: DC
  Filled 2022-12-12: qty 1000

## 2022-12-12 NOTE — MAU Note (Signed)
..  Brittany [name] is a 42 y.o. at [redacted]w[redacted]d here in MAU reporting: lower abdominal pressure, swollen ankles, and nausea. Has been having these symptoms but they got worse today. Denies vaginal bleeding, leaking of fluid, or regular contractions. +FM Denies h/a, visual changes, or epigastric pain. Took BP medicine at 7am and 9pm.   Pain score: 8/10 Vitals:   12/12/22 2203  BP: (!) 145/95  Pulse: 72  Resp: 19  Temp: 97.9 F (36.6 C)  SpO2: 99%     FHT:150 Lab orders placed from triage:  UA

## 2022-12-12 NOTE — MAU Provider Note (Addendum)
Chief Complaint:  Abdominal Pain, Nausea, and Leg Swelling   Event Date/Time   First Provider Initiated Contact with Patient 12/12/22 2250     HPI: Brittany [name] is a 41 y.o. [id number] at 35w4dwho presents to maternity admissions reporting elevated blood pressure, leg swelling and pelvic pressure.  Has had all these for the past few weeks but they are worse tonight.  States is taking her Labetalol "3 times at 7am and 3 times at 9pm".   Is supposed to be taking it TID. She reports good fetal movement, denies LOF, vaginal bleeding, dizziness, n/v, or fever/chills.  She denies headache, visual changes or RUQ abdominal pain.  Hypertension This is a chronic problem. Pertinent negatives include no anxiety, blurred vision, chest pain or headaches. There are no associated agents to hypertension. Past treatments include nothing. Compliance problems: misunderstood frequency of meds.    RN Note: Brittany [name] is a 41 y.o. at [redacted]w[redacted]d here in MAU reporting: lower abdominal pressure, swollen ankles, and nausea. Has been having these symptoms but they got worse today. Denies vaginal bleeding, leaking of fluid, or regular contractions. +FM Denies h/a, visual changes, or epigastric pain. Took BP medicine at 7am and 9pm.   Past Medical History: Past Medical History:  Diagnosis Date   Anemia 11/30/2016   Gallstone    Headache(784.0)    Heart murmur    Hypertension    Infection    UTI   Maternal chronic hypertension in first trimester 04/09/2011   Migraine 03/18/2016   Needs referral to headache specialist   Murmur, cardiac 11/30/2016   Cardiology evaluation: normal 12/2017   Pregnancy induced hypertension    after 2nd delivery   Vaginal Pap smear, abnormal    cant remember    Past obstetric history: OB History  Gravida Para Term Preterm AB Living  10 4 2 2 5 4  SAB IAB Ectopic Multiple Live Births  4 1 0 0 4    # Outcome Date GA Lbr Len/2nd Weight Sex Delivery Anes PTL Lv  10 Current            9 Preterm 06/13/18 [redacted]w[redacted]d 07:04 / 00:15 2300 g F Vag-Spont EPI  LIV  8 Term 05/03/16 [redacted]w[redacted]d 00:08 / 00:01 2175 g M Vag-Spont None  LIV  7 Preterm 04/06/11 [redacted]w[redacted]d 08:25 / 00:08 2455 g F Vag-Spont None  LIV     Complications: PIH (pregnancy induced hypertension)  6 Term 10/21/05 [redacted]w[redacted]d    Vag-Spont   LIV     Complications: Failure to Progress in Second Stage  5 IAB           4 SAB           3 SAB           2 SAB           1 SAB             Past Surgical History: Past Surgical History:  Procedure Laterality Date   DILATION AND CURETTAGE OF UTERUS     dont remember year   LEEP      Family History: Family History  Problem Relation Age of Onset   Asthma Mother    Hypertension Mother    CAD Mother    Asthma Maternal Grandmother    Hypertension Maternal Grandmother     Social History: Social History   Tobacco Use   Smoking status: Never   Smokeless tobacco: Never  Vaping Use   Vaping Use: Never   used  Substance Use Topics   Alcohol use: No    Alcohol/week: 0.0 standard drinks of alcohol   Drug use: No    Allergies: No Known Allergies  Meds:  Medications Prior to Admission  Medication Sig Dispense Refill Last Dose   aspirin EC 81 MG tablet Take 1 tablet (81 mg total) by mouth daily. Take after 12 weeks for prevention of preeclampsia later in pregnancy 300 tablet 2 12/12/2022   Butalbital-APAP-Caffeine (FIORICET) 50-300-40 MG CAPS Take 1-2 capsules by mouth every 6 (six) hours as needed (severe headache). 20 capsule 0 Past Week   cyclobenzaprine (FLEXERIL) 10 MG tablet Take 1 tablet (10 mg total) by mouth 3 (three) times daily as needed for muscle spasms (headaches). 30 tablet 2 Past Week   labetalol (NORMODYNE) 200 MG tablet Take 3 tablets (600 mg total) by mouth 3 (three) times daily. 180 tablet 3 12/12/2022   prenatal vitamin w/FE, FA (PRENATAL 1 + 1) 27-1 MG TABS tablet Take 1 tablet by mouth daily at 12 noon. 30 tablet 11 Past Week   promethazine (PHENERGAN) 25 MG  tablet Take 1 tablet (25 mg total) by mouth every 6 (six) hours as needed for nausea or vomiting. 30 tablet 0 Past Week    I have reviewed patient's Past Medical Hx, Surgical Hx, Family Hx, Social Hx, medications and allergies.   ROS:  Review of Systems  Eyes:  Negative for blurred vision.  Cardiovascular:  Negative for chest pain.  Neurological:  Negative for headaches.   Other systems negative  Physical Exam  Patient Vitals for the past 24 hrs:  BP Temp Temp src Pulse Resp SpO2 Height Weight  12/12/22 2203 (!) 145/95 97.9 F (36.6 C) Oral 72 19 99 % 5' 5" (1.651 m) 81.3 kg   Vitals:   12/12/22 2302 12/12/22 2315 12/12/22 2330 12/12/22 2342  BP: (!) 156/95 (!) 156/93 (!) 161/92 (!) 160/93  Pulse: 68 70 68 67  Resp:      Temp:      TempSrc:      SpO2:      Weight:      Height:        Constitutional: Well-developed, well-nourished female in no acute distress.  Cardiovascular: normal rate  Respiratory: normal effort GI: Abd soft, non-tender, gravid appropriate for gestational age.   No rebound or guarding. MS: Extremities nontender, no edema, normal ROM Neurologic: Alert and oriented x 4.  GU: Neg CVAT.  PELVIC EXAM:   Dilation: 2 Effacement (%): 70 Cervical Position: Middle Station: -2 Presentation: Vertex Exam by:: Paxtyn Wisdom CNM  FHT:  Baseline 140 , moderate variability, accelerations present, no decelerations Contractions: Occasional and Irregular     Labs: Ordered --/--/O POS (05/20 0951)  Imaging:    MAU Course/MDM: I have reviewed the triage vital signs and the nursing notes.   Pertinent labs & imaging results that were available during my care of the patient were reviewed by me and considered in my medical decision making (see chart for details).      I have reviewed her medical records including past results, notes and treatments.   I have ordered labs and will review results NST reviewed, reactive Consult Dr Arnold with presentation, exam  findings and test results.  Treatments in MAU included zofran.    Since pt has had two severe range BPs tonight and had several on last MAU visit, decision made to induce labor  Assessment: Single IUP at [redacted]w[redacted]d Preeclampsia with severe features  Plan:   Admit to Labor and Delivery Routine orders Preeclampsia focused order set Magnesium Sulfate infusion Anticipate SVD with Induction of Labor with Pitocin  Vietta Bonifield CNM, MSN Certified Nurse-Midwife 12/12/2022 10:51 PM 

## 2022-12-12 NOTE — H&P (Signed)
Chief Complaint:  Abdominal Pain, Nausea, and Leg Swelling   Event Date/Time   First Provider Initiated Contact with Patient 12/12/22 2250     HPI: Rickell Champlin is a 42 y.o. [id number] at 49w4dwho presents to maternity admissions reporting elevated blood pressure, leg swelling and pelvic pressure.  Has had all these for the past few weeks but they are worse tonight.  States is taking her Labetalol "3 times at 7am and 3 times at 9pm".   Is supposed to be taking it TID. She reports good fetal movement, denies LOF, vaginal bleeding, dizziness, n/v, or fever/chills.  She denies headache, visual changes or RUQ abdominal pain.  Hypertension This is a chronic problem. Pertinent negatives include no anxiety, blurred vision, chest pain or headaches. There are no associated agents to hypertension. Past treatments include nothing. Compliance problems: misunderstood frequency of meds.    RN Note: Dollie Waldoch is a 42 y.o. at [redacted]w[redacted]d here in MAU reporting: lower abdominal pressure, swollen ankles, and nausea. Has been having these symptoms but they got worse today. Denies vaginal bleeding, leaking of fluid, or regular contractions. +FM Denies h/a, visual changes, or epigastric pain. Took BP medicine at 7am and 9pm.   Past Medical History: Past Medical History:  Diagnosis Date   Anemia 11/30/2016   Gallstone    Headache(784.0)    Heart murmur    Hypertension    Infection    UTI   Maternal chronic hypertension in first trimester 04/09/2011   Migraine 03/18/2016   Needs referral to headache specialist   Murmur, cardiac 11/30/2016   Cardiology evaluation: normal 12/2017   Pregnancy induced hypertension    after 2nd delivery   Vaginal Pap smear, abnormal    cant remember    Past obstetric history: OB History  Gravida Para Term Preterm AB Living  10 4 2 2 5 4   SAB IAB Ectopic Multiple Live Births  4 1 0 0 4    # Outcome Date GA Lbr Len/2nd Weight Sex Delivery Anes PTL Lv  10 Current            9 Preterm 06/13/18 [redacted]w[redacted]d 07:04 / 00:15 2300 g F Vag-Spont EPI  LIV  8 Term 05/03/16 [redacted]w[redacted]d 00:08 / 00:01 2175 g M Vag-Spont None  LIV  7 Preterm 04/06/11 [redacted]w[redacted]d 08:25 / 00:08 2455 g F Vag-Spont None  LIV     Complications: PIH (pregnancy induced hypertension)  6 Term 10/21/05 [redacted]w[redacted]d    Vag-Spont   LIV     Complications: Failure to Progress in Second Stage  5 IAB           4 SAB           3 SAB           2 SAB           1 SAB             Past Surgical History: Past Surgical History:  Procedure Laterality Date   DILATION AND CURETTAGE OF UTERUS     dont remember year   LEEP      Family History: Family History  Problem Relation Age of Onset   Asthma Mother    Hypertension Mother    CAD Mother    Asthma Maternal Grandmother    Hypertension Maternal Grandmother     Social History: Social History   Tobacco Use   Smoking status: Never   Smokeless tobacco: Never  Vaping Use   Vaping Use: Never  used  Substance Use Topics   Alcohol use: No    Alcohol/week: 0.0 standard drinks of alcohol   Drug use: No    Allergies: No Known Allergies  Meds:  Medications Prior to Admission  Medication Sig Dispense Refill Last Dose   aspirin EC 81 MG tablet Take 1 tablet (81 mg total) by mouth daily. Take after 12 weeks for prevention of preeclampsia later in pregnancy 300 tablet 2 12/12/2022   Butalbital-APAP-Caffeine (FIORICET) 50-300-40 MG CAPS Take 1-2 capsules by mouth every 6 (six) hours as needed (severe headache). 20 capsule 0 Past Week   cyclobenzaprine (FLEXERIL) 10 MG tablet Take 1 tablet (10 mg total) by mouth 3 (three) times daily as needed for muscle spasms (headaches). 30 tablet 2 Past Week   labetalol (NORMODYNE) 200 MG tablet Take 3 tablets (600 mg total) by mouth 3 (three) times daily. 180 tablet 3 12/12/2022   prenatal vitamin w/FE, FA (PRENATAL 1 + 1) 27-1 MG TABS tablet Take 1 tablet by mouth daily at 12 noon. 30 tablet 11 Past Week   promethazine (PHENERGAN) 25 MG  tablet Take 1 tablet (25 mg total) by mouth every 6 (six) hours as needed for nausea or vomiting. 30 tablet 0 Past Week    I have reviewed patient's Past Medical Hx, Surgical Hx, Family Hx, Social Hx, medications and allergies.   ROS:  Review of Systems  Eyes:  Negative for blurred vision.  Cardiovascular:  Negative for chest pain.  Neurological:  Negative for headaches.   Other systems negative  Physical Exam  Patient Vitals for the past 24 hrs:  BP Temp Temp src Pulse Resp SpO2 Height Weight  12/12/22 2203 (!) 145/95 97.9 F (36.6 C) Oral 72 19 99 % 5\' 5"  (1.651 m) 81.3 kg   Vitals:   12/12/22 2302 12/12/22 2315 12/12/22 2330 12/12/22 2342  BP: (!) 156/95 (!) 156/93 (!) 161/92 (!) 160/93  Pulse: 68 70 68 67  Resp:      Temp:      TempSrc:      SpO2:      Weight:      Height:        Constitutional: Well-developed, well-nourished female in no acute distress.  Cardiovascular: normal rate  Respiratory: normal effort GI: Abd soft, non-tender, gravid appropriate for gestational age.   No rebound or guarding. MS: Extremities nontender, no edema, normal ROM Neurologic: Alert and oriented x 4.  GU: Neg CVAT.  PELVIC EXAM:   Dilation: 2 Effacement (%): 70 Cervical Position: Middle Station: -2 Presentation: Vertex Exam by:: Mayford Knife CNM  FHT:  Baseline 140 , moderate variability, accelerations present, no decelerations Contractions: Occasional and Irregular     Labs: Ordered --/--/O POS (05/20 6045)  Imaging:    MAU Course/MDM: I have reviewed the triage vital signs and the nursing notes.   Pertinent labs & imaging results that were available during my care of the patient were reviewed by me and considered in my medical decision making (see chart for details).      I have reviewed her medical records including past results, notes and treatments.   I have ordered labs and will review results NST reviewed, reactive Consult Dr Debroah Loop with presentation, exam  findings and test results.  Treatments in MAU included zofran.    Since pt has had two severe range BPs tonight and had several on last MAU visit, decision made to induce labor  Assessment: Single IUP at 107w4d Preeclampsia with severe features  Plan:  Admit to Labor and Delivery Routine orders Preeclampsia focused order set Magnesium Sulfate infusion Anticipate SVD with Induction of Labor with Pitocin  Wynelle Bourgeois CNM, MSN Certified Nurse-Midwife 12/12/2022 10:51 PM

## 2022-12-13 ENCOUNTER — Encounter (HOSPITAL_COMMUNITY): Payer: Self-pay | Admitting: Obstetrics & Gynecology

## 2022-12-13 ENCOUNTER — Ambulatory Visit: Payer: PRIVATE HEALTH INSURANCE

## 2022-12-13 ENCOUNTER — Inpatient Hospital Stay (HOSPITAL_COMMUNITY): Payer: PRIVATE HEALTH INSURANCE | Admitting: Anesthesiology

## 2022-12-13 ENCOUNTER — Other Ambulatory Visit: Payer: PRIVATE HEALTH INSURANCE

## 2022-12-13 ENCOUNTER — Other Ambulatory Visit: Payer: Self-pay

## 2022-12-13 DIAGNOSIS — O1092 Unspecified pre-existing hypertension complicating childbirth: Secondary | ICD-10-CM | POA: Diagnosis present

## 2022-12-13 DIAGNOSIS — O1414 Severe pre-eclampsia complicating childbirth: Secondary | ICD-10-CM | POA: Diagnosis not present

## 2022-12-13 DIAGNOSIS — R030 Elevated blood-pressure reading, without diagnosis of hypertension: Secondary | ICD-10-CM | POA: Diagnosis present

## 2022-12-13 DIAGNOSIS — Z3A35 35 weeks gestation of pregnancy: Secondary | ICD-10-CM | POA: Diagnosis not present

## 2022-12-13 DIAGNOSIS — O1413 Severe pre-eclampsia, third trimester: Secondary | ICD-10-CM | POA: Diagnosis present

## 2022-12-13 DIAGNOSIS — O09523 Supervision of elderly multigravida, third trimester: Secondary | ICD-10-CM | POA: Diagnosis not present

## 2022-12-13 DIAGNOSIS — O1410 Severe pre-eclampsia, unspecified trimester: Secondary | ICD-10-CM

## 2022-12-13 DIAGNOSIS — O114 Pre-existing hypertension with pre-eclampsia, complicating childbirth: Secondary | ICD-10-CM | POA: Diagnosis present

## 2022-12-13 LAB — CBC
HCT: 31.6 % — ABNORMAL LOW (ref 36.0–46.0)
HCT: 32.5 % — ABNORMAL LOW (ref 36.0–46.0)
HCT: 33.5 % — ABNORMAL LOW (ref 36.0–46.0)
Hemoglobin: 10.4 g/dL — ABNORMAL LOW (ref 12.0–15.0)
Hemoglobin: 10.9 g/dL — ABNORMAL LOW (ref 12.0–15.0)
Hemoglobin: 11 g/dL — ABNORMAL LOW (ref 12.0–15.0)
MCH: 27.9 pg (ref 26.0–34.0)
MCH: 27.7 pg (ref 26.0–34.0)
MCH: 27.6 pg (ref 26.0–34.0)
MCHC: 32.9 g/dL (ref 30.0–36.0)
MCHC: 33.5 g/dL (ref 30.0–36.0)
MCHC: 32.8 g/dL (ref 30.0–36.0)
MCV: 84.7 fL (ref 80.0–100.0)
MCV: 82.7 fL (ref 80.0–100.0)
MCV: 84 fL (ref 80.0–100.0)
Platelets: 215 10*3/uL (ref 150–400)
Platelets: 221 10*3/uL (ref 150–400)
Platelets: 236 10*3/uL (ref 150–400)
RBC: 3.73 MIL/uL — ABNORMAL LOW (ref 3.87–5.11)
RBC: 3.93 MIL/uL (ref 3.87–5.11)
RBC: 3.99 MIL/uL (ref 3.87–5.11)
RDW: 13.5 % (ref 11.5–15.5)
RDW: 13.7 % (ref 11.5–15.5)
RDW: 13.6 % (ref 11.5–15.5)
WBC: 10.5 10*3/uL (ref 4.0–10.5)
WBC: 9.8 10*3/uL (ref 4.0–10.5)
WBC: 9.3 10*3/uL (ref 4.0–10.5)
nRBC: 0 % (ref 0.0–0.2)
nRBC: 0 % (ref 0.0–0.2)
nRBC: 0 % (ref 0.0–0.2)

## 2022-12-13 LAB — COMPREHENSIVE METABOLIC PANEL
ALT: 23 U/L (ref 0–44)
ALT: 26 U/L (ref 0–44)
AST: 29 U/L (ref 15–41)
AST: 30 U/L (ref 15–41)
Albumin: 2.6 g/dL — ABNORMAL LOW (ref 3.5–5.0)
Albumin: 2.6 g/dL — ABNORMAL LOW (ref 3.5–5.0)
Alkaline Phosphatase: 102 U/L (ref 38–126)
Alkaline Phosphatase: 102 U/L (ref 38–126)
Anion gap: 10 (ref 5–15)
Anion gap: 9 (ref 5–15)
BUN: 9 mg/dL (ref 6–20)
BUN: 7 mg/dL (ref 6–20)
CO2: 18 mmol/L — ABNORMAL LOW (ref 22–32)
CO2: 20 mmol/L — ABNORMAL LOW (ref 22–32)
Calcium: 8.2 mg/dL — ABNORMAL LOW (ref 8.9–10.3)
Calcium: 6.9 mg/dL — ABNORMAL LOW (ref 8.9–10.3)
Chloride: 104 mmol/L (ref 98–111)
Chloride: 103 mmol/L (ref 98–111)
Creatinine, Ser: 0.62 mg/dL (ref 0.44–1.00)
Creatinine, Ser: 0.64 mg/dL (ref 0.44–1.00)
GFR, Estimated: >60 mL/min (ref >60)
GFR, Estimated: >60 mL/min (ref >60)
Glucose, Bld: 74 mg/dL (ref 70–99)
Glucose, Bld: 72 mg/dL (ref 70–99)
Potassium: 3.2 mmol/L — ABNORMAL LOW (ref 3.5–5.1)
Potassium: 3.3 mmol/L — ABNORMAL LOW (ref 3.5–5.1)
Sodium: 132 mmol/L — ABNORMAL LOW (ref 135–145)
Sodium: 132 mmol/L — ABNORMAL LOW (ref 135–145)
Total Bilirubin: 0.6 mg/dL (ref 0.3–1.2)
Total Bilirubin: 0.5 mg/dL (ref 0.3–1.2)
Total Protein: 6.3 g/dL — ABNORMAL LOW (ref 6.5–8.1)
Total Protein: 6.3 g/dL — ABNORMAL LOW (ref 6.5–8.1)

## 2022-12-13 LAB — MAGNESIUM: Magnesium: 3.5 mg/dL — ABNORMAL HIGH (ref 1.7–2.4)

## 2022-12-13 LAB — PROTEIN / CREATININE RATIO, URINE
Creatinine, Urine: 70 mg/dL
Protein Creatinine Ratio: 0.14 mg/mg{Cre} (ref 0.00–0.15)
Total Protein, Urine: 10 mg/dL

## 2022-12-13 LAB — TYPE AND SCREEN
ABO/RH(D): O POS
Antibody Screen: NEGATIVE

## 2022-12-13 LAB — RPR: RPR Ser Ql: NONREACTIVE

## 2022-12-13 MED ORDER — DIBUCAINE (PERIANAL) 1 % EX OINT: 1.0000 | TOPICAL_OINTMENT | CUTANEOUS | Status: DC | PRN

## 2022-12-13 MED ORDER — OXYCODONE-ACETAMINOPHEN 5-325 MG PO TABS: 1.0000 | ORAL_TABLET | ORAL | Status: DC | PRN

## 2022-12-13 MED ORDER — PHENYLEPHRINE 80 MCG/ML (10ML) SYRINGE FOR IV PUSH (FOR BLOOD PRESSURE SUPPORT): 80.0000 ug | PREFILLED_SYRINGE | INTRAVENOUS | Status: DC | PRN

## 2022-12-13 MED ORDER — PRENATAL MULTIVITAMIN CH
1.0000 | ORAL_TABLET | Freq: Every day | ORAL | Status: DC
  Administered 2022-12-14 – 2022-12-15 (×2): 1 via ORAL
  Filled 2022-12-13 (×2): qty 1

## 2022-12-13 MED ORDER — LACTATED RINGERS IV SOLN: INTRAVENOUS | Status: DC

## 2022-12-13 MED ORDER — IBUPROFEN 600 MG PO TABS
600.0000 mg | ORAL_TABLET | Freq: Four times a day (QID) | ORAL | Status: DC
  Administered 2022-12-13 – 2022-12-15 (×8): 600 mg via ORAL
  Filled 2022-12-13 (×9): qty 1

## 2022-12-13 MED ORDER — DIPHENHYDRAMINE HCL 50 MG/ML IJ SOLN: 12.5000 mg | INTRAMUSCULAR | Status: DC | PRN

## 2022-12-13 MED ORDER — LACTATED RINGERS IV SOLN: 500.0000 mL | Freq: Once | INTRAVENOUS | Status: DC

## 2022-12-13 MED ORDER — EPHEDRINE 5 MG/ML INJ: 10.0000 mg | INTRAVENOUS | Status: DC | PRN

## 2022-12-13 MED ORDER — LIDOCAINE HCL (PF) 1 % IJ SOLN
INTRAMUSCULAR | Status: DC | PRN
  Administered 2022-12-13: 5 mL via EPIDURAL

## 2022-12-13 MED ORDER — SODIUM CHLORIDE 0.9 % IV SOLN
5.0000 10*6.[IU] | Freq: Once | INTRAVENOUS | Status: AC
  Administered 2022-12-13: 5 10*6.[IU] via INTRAVENOUS
  Filled 2022-12-13: qty 5

## 2022-12-13 MED ORDER — OXYCODONE-ACETAMINOPHEN 5-325 MG PO TABS: 2.0000 | ORAL_TABLET | ORAL | Status: DC | PRN

## 2022-12-13 MED ORDER — ZOLPIDEM TARTRATE 5 MG PO TABS: 5.0000 mg | ORAL_TABLET | Freq: Every evening | ORAL | Status: DC | PRN

## 2022-12-13 MED ORDER — TERBUTALINE SULFATE 1 MG/ML IJ SOLN: 0.2500 mg | Freq: Once | INTRAMUSCULAR | Status: DC | PRN

## 2022-12-13 MED ORDER — DIPHENHYDRAMINE HCL 50 MG/ML IJ SOLN
INTRAMUSCULAR | Status: AC
  Filled 2022-12-13: qty 1

## 2022-12-13 MED ORDER — ONDANSETRON HCL 4 MG/2ML IJ SOLN: 4.0000 mg | Freq: Four times a day (QID) | INTRAMUSCULAR | Status: DC | PRN

## 2022-12-13 MED ORDER — PHENYLEPHRINE 80 MCG/ML (10ML) SYRINGE FOR IV PUSH (FOR BLOOD PRESSURE SUPPORT)
80.0000 ug | PREFILLED_SYRINGE | INTRAVENOUS | Status: DC | PRN
  Filled 2022-12-13: qty 10

## 2022-12-13 MED ORDER — SIMETHICONE 80 MG PO CHEW: 80.0000 mg | CHEWABLE_TABLET | ORAL | Status: DC | PRN

## 2022-12-13 MED ORDER — MAGNESIUM SULFATE 40 GM/1000ML IV SOLN: 2.0000 g/h | INTRAVENOUS | Status: DC

## 2022-12-13 MED ORDER — LABETALOL HCL 5 MG/ML IV SOLN: 80.0000 mg | INTRAVENOUS | Status: DC | PRN

## 2022-12-13 MED ORDER — LIDOCAINE-EPINEPHRINE (PF) 1.5 %-1:200000 IJ SOLN
INTRAMUSCULAR | Status: DC | PRN
  Administered 2022-12-13: 5 mL via EPIDURAL

## 2022-12-13 MED ORDER — DIPHENHYDRAMINE HCL 50 MG/ML IJ SOLN
25.0000 mg | Freq: Once | INTRAMUSCULAR | Status: AC
  Administered 2022-12-13: 25 mg via INTRAVENOUS

## 2022-12-13 MED ORDER — MAGNESIUM SULFATE 40 GM/1000ML IV SOLN
2.0000 g/h | INTRAVENOUS | Status: AC
  Administered 2022-12-13: 2 g/h via INTRAVENOUS
  Filled 2022-12-13: qty 1000

## 2022-12-13 MED ORDER — SODIUM CHLORIDE 0.9% FLUSH
3.0000 mL | Freq: Two times a day (BID) | INTRAVENOUS | Status: DC
  Administered 2022-12-14: 3 mL via INTRAVENOUS

## 2022-12-13 MED ORDER — FUROSEMIDE 20 MG PO TABS
20.0000 mg | ORAL_TABLET | Freq: Every day | ORAL | Status: DC
  Administered 2022-12-13 – 2022-12-15 (×3): 20 mg via ORAL
  Filled 2022-12-13 (×3): qty 1

## 2022-12-13 MED ORDER — SOD CITRATE-CITRIC ACID 500-334 MG/5ML PO SOLN: 30.0000 mL | ORAL | Status: DC | PRN

## 2022-12-13 MED ORDER — ACETAMINOPHEN 325 MG PO TABS
650.0000 mg | ORAL_TABLET | ORAL | Status: DC | PRN
  Administered 2022-12-13: 650 mg via ORAL
  Filled 2022-12-13: qty 2

## 2022-12-13 MED ORDER — OXYTOCIN BOLUS FROM INFUSION: 333.0000 mL | Freq: Once | INTRAVENOUS | Status: DC

## 2022-12-13 MED ORDER — LABETALOL HCL 5 MG/ML IV SOLN: 20.0000 mg | INTRAVENOUS | Status: DC | PRN

## 2022-12-13 MED ORDER — DIPHENHYDRAMINE HCL 25 MG PO CAPS: 25.0000 mg | ORAL_CAPSULE | Freq: Four times a day (QID) | ORAL | Status: DC | PRN

## 2022-12-13 MED ORDER — HYDRALAZINE HCL 20 MG/ML IJ SOLN: 10.0000 mg | INTRAMUSCULAR | Status: DC | PRN

## 2022-12-13 MED ORDER — COCONUT OIL OIL: 1.0000 | TOPICAL_OIL | Status: DC | PRN

## 2022-12-13 MED ORDER — ONDANSETRON HCL 4 MG PO TABS: 4.0000 mg | ORAL_TABLET | ORAL | Status: DC | PRN

## 2022-12-13 MED ORDER — METOCLOPRAMIDE HCL 5 MG/ML IJ SOLN
5.0000 mg | Freq: Once | INTRAMUSCULAR | Status: AC
  Administered 2022-12-13: 5 mg via INTRAVENOUS
  Filled 2022-12-13: qty 2

## 2022-12-13 MED ORDER — FENTANYL-BUPIVACAINE-NACL 0.5-0.125-0.9 MG/250ML-% EP SOLN
12.0000 mL/h | EPIDURAL | Status: DC | PRN
  Administered 2022-12-13: 12 mL/h via EPIDURAL
  Filled 2022-12-13: qty 250

## 2022-12-13 MED ORDER — WITCH HAZEL-GLYCERIN EX PADS: 1.0000 | MEDICATED_PAD | CUTANEOUS | Status: DC | PRN

## 2022-12-13 MED ORDER — SENNOSIDES-DOCUSATE SODIUM 8.6-50 MG PO TABS
2.0000 | ORAL_TABLET | ORAL | Status: DC
  Administered 2022-12-13 – 2022-12-14 (×2): 2 via ORAL
  Filled 2022-12-13 (×2): qty 2

## 2022-12-13 MED ORDER — OXYTOCIN-SODIUM CHLORIDE 30-0.9 UT/500ML-% IV SOLN: 1.0000 m[IU]/min | INTRAVENOUS | Status: DC

## 2022-12-13 MED ORDER — TETANUS-DIPHTH-ACELL PERTUSSIS 5-2.5-18.5 LF-MCG/0.5 IM SUSY: 0.5000 mL | PREFILLED_SYRINGE | Freq: Once | INTRAMUSCULAR | Status: DC

## 2022-12-13 MED ORDER — OXYTOCIN-SODIUM CHLORIDE 30-0.9 UT/500ML-% IV SOLN
1.0000 m[IU]/min | INTRAVENOUS | Status: DC
  Administered 2022-12-13: 2 m[IU]/min via INTRAVENOUS
  Filled 2022-12-13: qty 500

## 2022-12-13 MED ORDER — LIDOCAINE HCL (PF) 1 % IJ SOLN: 30.0000 mL | INTRAMUSCULAR | Status: DC | PRN

## 2022-12-13 MED ORDER — PENICILLIN G POT IN DEXTROSE 60000 UNIT/ML IV SOLN
3.0000 10*6.[IU] | INTRAVENOUS | Status: DC
  Administered 2022-12-13: 3 10*6.[IU] via INTRAVENOUS
  Filled 2022-12-13 (×3): qty 50

## 2022-12-13 MED ORDER — LABETALOL HCL 5 MG/ML IV SOLN: 40.0000 mg | INTRAVENOUS | Status: DC | PRN

## 2022-12-13 MED ORDER — ACETAMINOPHEN 325 MG PO TABS
650.0000 mg | ORAL_TABLET | ORAL | Status: DC | PRN
  Administered 2022-12-13 – 2022-12-15 (×4): 650 mg via ORAL
  Filled 2022-12-13 (×4): qty 2

## 2022-12-13 MED ORDER — BENZOCAINE-MENTHOL 20-0.5 % EX AERO
1.0000 | INHALATION_SPRAY | CUTANEOUS | Status: DC | PRN
  Administered 2022-12-13: 1 via TOPICAL
  Filled 2022-12-13: qty 56

## 2022-12-13 MED ORDER — OXYTOCIN-SODIUM CHLORIDE 30-0.9 UT/500ML-% IV SOLN: 2.5000 [IU]/h | INTRAVENOUS | Status: DC

## 2022-12-13 MED ORDER — SODIUM CHLORIDE 0.9% FLUSH: 3.0000 mL | INTRAVENOUS | Status: DC | PRN

## 2022-12-13 MED ORDER — ONDANSETRON HCL 4 MG/2ML IJ SOLN: 4.0000 mg | INTRAMUSCULAR | Status: DC | PRN

## 2022-12-13 MED ORDER — LACTATED RINGERS IV SOLN: 500.0000 mL | INTRAVENOUS | Status: DC | PRN

## 2022-12-13 MED ORDER — SODIUM CHLORIDE 0.9 % IV SOLN: 250.0000 mL | INTRAVENOUS | Status: DC | PRN

## 2022-12-13 NOTE — Anesthesia Preprocedure Evaluation (Signed)
Anesthesia Evaluation  Patient identified by MRN, date of birth, ID band Patient awake    Reviewed: Allergy & Precautions, H&P , NPO status , Patient's Chart, lab work & pertinent test results  History of Anesthesia Complications Negative for: history of anesthetic complications  Airway Mallampati: II  TM Distance: >3 FB Neck ROM: full    Dental no notable dental hx. (+) Teeth Intact   Pulmonary neg pulmonary ROS   Pulmonary exam normal        Cardiovascular hypertension, Normal cardiovascular exam     Neuro/Psych  Headaches  negative psych ROS   GI/Hepatic negative GI ROS, Neg liver ROS,,,  Endo/Other  negative endocrine ROS    Renal/GU negative Renal ROS  negative genitourinary   Musculoskeletal   Abdominal  (+) + obese  Peds  Hematology  (+) Blood dyscrasia, anemia   Anesthesia Other Findings   Reproductive/Obstetrics (+) Pregnancy                             Anesthesia Physical Anesthesia Plan  ASA: 3  Anesthesia Plan: Epidural   Post-op Pain Management:    Induction:   PONV Risk Score and Plan:   Airway Management Planned:   Additional Equipment:   Intra-op Plan:   Post-operative Plan:   Informed Consent: I have reviewed the patients History and Physical, chart, labs and discussed the procedure including the risks, benefits and alternatives for the proposed anesthesia with the patient or authorized representative who has indicated his/her understanding and acceptance.       Plan Discussed with:   Anesthesia Plan Comments: (Pre-E On MAG)       Anesthesia Quick Evaluation

## 2022-12-13 NOTE — Progress Notes (Signed)
Patient ID: Brittany [name], female   DOB: 10/10/1980, 41 y.o.   MRN: [id number] Magnesium level is 3.5 Creatinine is normal   Transaminases and Platelets normal   WIll restart Magnesium Sulfate at 2gm/hr

## 2022-12-13 NOTE — Progress Notes (Signed)
Labor Progress Note  Brittany [name] is a 42 y.o. [id number] at [redacted]w[redacted]d presented for IOL PreE with SF  S: pt doing well, on mag gtt  O:  BP 133/85   Pulse 72   Temp 97.6 F (36.4 C) (Oral)   Resp 16   Ht 5\' 5"  (1.651 m)   Wt 81.3 kg   LMP 04/07/2022 (Exact Date)   SpO2 100%   BMI 29.82 kg/m  EFM: 120bpm/Moderate variability/ 10x10 accels/ Early decels  CVE: Dilation: 4 Effacement (%): 70 Cervical Position: Middle Station: -3 Presentation: Vertex Exam by:: mercado ortiz   A&P: 42 y.o. [name] [redacted]w[redacted]d  here for IOL as above  #Labor: Progressing well. AROM clear  #Pain: Family/Friend support and PO/IV pain meds #FWB: CAT 1 #GBS  pending- PCN ongoing #PreEclampsia with SF: on Mag gtt. Last mag level was 3.2 after being stopped at 0707 d/t lack of DTRs. Mag was restarted at 0830. BP mild range. Labetolol protocol in place  Myrtie Hawk, DO FMOB Fellow, Faculty practice [facility], Center for [facility] 12/13/22  9:31 AM

## 2022-12-13 NOTE — Progress Notes (Signed)
Patient ID: Brittany [name], female   DOB: [birth year], 42 y.o.   MRN: [id number] RN Reports loss of reflexes, confirmed by ROB nurse  Patient has 7/10 headache  Will check CBC, CMET and Mag Level  Will turn off Magnesium for now until levels back  Vitals:   12/13/22 0531 12/13/22 0609 12/13/22 0631 12/13/22 0701  BP: 126/78  122/84 125/82  Pulse: 80  73 79  Resp:  17  16  Temp:  97.6 F (36.4 C)    TempSrc:  Oral    SpO2:      Weight:      Height:

## 2022-12-13 NOTE — Progress Notes (Signed)
Patient ID: Brittany [name], female   DOB: March 12, 1981, 42 y.o.   MRN: [id number] Vitals:   12/13/22 0303 12/13/22 0331 12/13/22 0401 12/13/22 0506  BP: 134/83 132/84 127/80 130/81  Pulse: 78 80 83 87  Resp: 15  16 16   Temp:      TempSrc:      SpO2:      Weight:      Height:       FHR reactive with good accels and variability UCs regular every 2-35min  Cervix exam deferred  Results for orders placed or performed during the hospital encounter of 12/12/22 (from the past 24 hour(s))  Urinalysis, Routine w reflex microscopic -Urine, Clean Catch     Status: Abnormal   Collection Time: 12/12/22 10:08 PM  Result Value Ref Range   Color, Urine YELLOW YELLOW   APPearance CLEAR CLEAR   Specific Gravity, Urine 1.012 1.005 - 1.030   pH 6.0 5.0 - 8.0   Glucose, UA NEGATIVE NEGATIVE mg/dL   Hgb urine dipstick NEGATIVE NEGATIVE   Bilirubin Urine NEGATIVE NEGATIVE   Ketones, ur 20 (A) NEGATIVE mg/dL   Protein, ur NEGATIVE NEGATIVE mg/dL   Nitrite NEGATIVE NEGATIVE   Leukocytes,Ua NEGATIVE NEGATIVE  CBC     Status: Abnormal   Collection Time: 12/12/22 11:22 PM  Result Value Ref Range   WBC 10.5 4.0 - 10.5 K/uL   RBC 3.73 (L) 3.87 - 5.11 MIL/uL   Hemoglobin 10.4 (L) 12.0 - 15.0 g/dL   HCT 95.2 (L) 84.1 - 32.4 %   MCV 84.7 80.0 - 100.0 fL   MCH 27.9 26.0 - 34.0 pg   MCHC 32.9 30.0 - 36.0 g/dL   RDW 40.1 02.7 - 25.3 %   Platelets 215 150 - 400 K/uL   nRBC 0.0 0.0 - 0.2 %  Comprehensive metabolic panel     Status: Abnormal   Collection Time: 12/12/22 11:22 PM  Result Value Ref Range   Sodium 132 (L) 135 - 145 mmol/L   Potassium 3.2 (L) 3.5 - 5.1 mmol/L   Chloride 104 98 - 111 mmol/L   CO2 18 (L) 22 - 32 mmol/L   Glucose, Bld 74 70 - 99 mg/dL   BUN 9 6 - 20 mg/dL   Creatinine, Ser 6.64 0.44 - 1.00 mg/dL   Calcium 8.2 (L) 8.9 - 10.3 mg/dL   Total Protein 6.3 (L) 6.5 - 8.1 g/dL   Albumin 2.6 (L) 3.5 - 5.0 g/dL   AST 29 15 - 41 U/L   ALT 23 0 - 44 U/L   Alkaline Phosphatase 102 38 -  126 U/L   Total Bilirubin 0.6 0.3 - 1.2 mg/dL   GFR, Estimated >40 >34 mL/min   Anion gap 10 5 - 15  Type and screen [facility]     Status: None   Collection Time: 12/13/22 12:00 AM  Result Value Ref Range   ABO/RH(D) O POS    Antibody Screen NEG    Sample Expiration      12/16/2022,2359 Performed at [facility], [address], [place], [place] [address]

## 2022-12-13 NOTE — Lactation Note (Signed)
This note was copied from a baby's chart. Lactation Consultation Note  Patient Name: Brittany [name] OZHYQ'M Date: 12/13/2022 Age:40 hours Reason for consult: Initial assessment;Late-preterm 34-36.6wks;Infant < 6lbs ( Less than 5 pounds ,  Mom is an experienced BF mom of 3 others. ( See below )  LC reviewed the LPT feeding policy and the reasons for following the feeding plan to protect the baby's weight, energy level and enhance establishing milk supply.  Mom receptive to the LC's review of plan. DEBP set and flange checked - see below.  LC reviewed pace feeding and provided formula . Per mom preferred formula over donor milk.     Maternal Data Does the patient have breastfeeding experience prior to this delivery?: Yes How long did the patient breastfeed?: per mom 1st 4 years , 2nd 3  years, 3rd 6 months  Feeding Mother's Current Feeding Choice: Breast Milk and Formula  LATCH Score ( Latch score by the [facility] RN )  Latch: Grasps breast easily, tongue down, lips flanged, rhythmical sucking.  Audible Swallowing: Spontaneous and intermittent  Type of Nipple: Everted at rest and after stimulation  Comfort (Breast/Nipple): Soft / non-tender  Hold (Positioning): No assistance needed to correctly position infant at breast.  LATCH Score: 10   Lactation Tools Discussed/Used Tools: Pump;Flanges Flange Size: 21;Other (comment);24 (#21 Flange fit the best and per  mom comfortable) Breast pump type: Double-Electric Breast Pump Pump Education: Setup, frequency, and cleaning;Milk Storage Reason for Pumping: LPT , less than 5 pounds  Interventions Interventions: Breast feeding basics reviewed;DEBP;Education;Pace feeding;LC Services brochure  Discharge Pump: Stork Pump WIC Program: Yes  Consult Status Consult Status: Follow-up Date: 12/14/22 Follow-up type: In-patient    Brittany [name] 12/13/2022, 7:33 PM

## 2022-12-13 NOTE — Anesthesia Procedure Notes (Signed)
Epidural Patient location during procedure: OB Start time: 12/13/2022 11:39 AM End time: 12/13/2022 11:49 AM  Staffing Anesthesiologist: Leonides Grills, MD Performed: anesthesiologist   Preanesthetic Checklist Completed: patient identified, IV checked, site marked, risks and benefits discussed, monitors and equipment checked, pre-op evaluation and timeout performed  Epidural Patient position: sitting Prep: DuraPrep Patient monitoring: heart rate, cardiac monitor, continuous pulse ox and blood pressure Approach: midline Location: L3-L4 Injection technique: LOR air  Needle:  Needle type: Tuohy  Needle gauge: 17 G Needle length: 9 cm Needle insertion depth: 6 cm Catheter type: closed end flexible Catheter size: 19 Gauge Catheter at skin depth: 11 cm Test dose: negative and 1.5% lidocaine with Epi 1:200 K  Assessment Events: blood not aspirated, no cerebrospinal fluid, injection not painful, no injection resistance and negative IV test  Additional Notes Informed consent obtained prior to proceeding including risk of failure, 1% risk of PDPH, risk of minor discomfort and bruising. Discussed alternatives to epidural analgesia and patient desires to proceed.  Timeout performed pre-procedure verifying patient name, procedure, and platelet count.  Patient tolerated procedure well. Reason for block:procedure for pain

## 2022-12-13 NOTE — Discharge Summary (Shared)
Postpartum Discharge Summary  Date of Service updated***     Patient Name: Brittany [name] DOB: 05-09-1981 MRN: [id number]  Date of admission: 12/12/2022 Delivery date:12/13/2022  Delivering provider: Myrtie Hawk  Date of discharge: 12/13/2022  Admitting diagnosis: Severe preeclampsia, third trimester [O14.13] Intrauterine pregnancy: [redacted]w[redacted]d     Secondary diagnosis:  Principal Problem:   Vaginal delivery Active Problems:   Chronic hypertension affecting pregnancy   Supervision of high risk pregnancy, antepartum   AMA (advanced maternal age) multigravida 35+   Severe preeclampsia, third trimester  Additional problems: ***    Discharge diagnosis: Term Pregnancy Delivered, Preeclampsia (severe), and CHTN                                              Post partum procedures:{Postpartum procedures:23558} Augmentation: AROM and Pitocin Complications: None  Hospital course: Induction of Labor With Vaginal Delivery   42 y.o. yo [id number] at [redacted]w[redacted]d was admitted to the hospital 12/12/2022 for induction of labor.  Indication for induction: Preeclampsia.  Patient had an labor course complicated by none. Membrane Rupture Time/Date: 9:29 AM ,12/13/2022   Delivery Method:  Episiotomy: None  Lacerations:  None  Details of delivery can be found in separate delivery note.  Patient had a postpartum course complicated by***. Patient is discharged home 12/13/22.  Newborn Data: Birth date:12/13/2022  Birth time:12:31 PM  Gender:Female  Living status:  Apgars: ,  Weight:   Magnesium Sulfate received: Yes: Seizure prophylaxis BMZ received: No Rhophylac:N/A MMR:N/A T-DaP:*** Flu: No Transfusion:{Transfusion received:30440034}  Physical exam  Vitals:   12/13/22 1159 12/13/22 1201 12/13/22 1206 12/13/22 1207  BP: 133/81 121/79  117/73  Pulse: 65 72  71  Resp:      Temp:      TempSrc:      SpO2:  99% 99%   Weight:      Height:       General: {Exam; general:21111117} Lochia:  {Desc; appropriate/inappropriate:30686::"appropriate"} Uterine Fundus: {Desc; firm/soft:30687} Incision: {Exam; incision:21111123} DVT Evaluation: {Exam; dvt:2111122} Labs: Lab Results  Component Value Date   WBC 9.8 12/13/2022   HGB 10.9 (L) 12/13/2022   HCT 32.5 (L) 12/13/2022   MCV 82.7 12/13/2022   PLT 221 12/13/2022      Latest Ref Rng & Units 12/13/2022    7:21 AM  CMP  Glucose 70 - 99 mg/dL 72   BUN 6 - 20 mg/dL 7   Creatinine 4.78 - 2.95 mg/dL 6.21   Sodium 308 - 657 mmol/L 132   Potassium 3.5 - 5.1 mmol/L 3.3   Chloride 98 - 111 mmol/L 103   CO2 22 - 32 mmol/L 20   Calcium 8.9 - 10.3 mg/dL 6.9   Total Protein 6.5 - 8.1 g/dL 6.3   Total Bilirubin 0.3 - 1.2 mg/dL 0.5   Alkaline Phos 38 - 126 U/L 102   AST 15 - 41 U/L 30   ALT 0 - 44 U/L 26    Edinburgh Score:    07/16/2018    8:47 AM  Edinburgh Postnatal Depression Scale Screening Tool  I have been able to laugh and see the funny side of things. 0  I have looked forward with enjoyment to things. 0  I have blamed myself unnecessarily when things went wrong. 0  I have been anxious or worried for no good reason. 0  I have felt scared or panicky  for no good reason. 0  Things have been getting on top of me. 0  I have been so unhappy that I have had difficulty sleeping. 0  I have felt sad or miserable. 0  I have been so unhappy that I have been crying. 0  The thought of harming myself has occurred to me. 0  Edinburgh Postnatal Depression Scale Total 0     After visit meds:  Allergies as of 12/13/2022   No Known Allergies   Med Rec must be completed prior to using this [facility]***        Discharge home in stable condition Infant Feeding: {Baby feeding:23562} Infant Disposition:{CHL IP OB HOME WITH ZOXWRU:04540} Discharge instruction: per After Visit Summary and Postpartum booklet. Activity: Advance as tolerated. Pelvic rest for 6 weeks.  Diet: {OB JWJX:91478295} Future Appointments: Future  Appointments  Date Time Provider Department Center  12/19/2022  2:30 PM [place] Bing, MD [facility]  12/26/2022  2:30 PM [place] Bing, MD [facility]  01/02/2023  2:30 PM Reva Bores, MD [facility]  01/09/2023  2:30 PM Anyanwu, Jethro Bastos, MD [facility]   Follow up Visit: Message sent to [facility] 5/28  Please schedule this patient for a In person postpartum visit in 6 weeks with the following provider: Any provider. Additional Postpartum F/U:BP check 1 week  High risk pregnancy complicated by: HTN Delivery mode:    Anticipated Birth Control:  Nexplanon   12/13/2022 Myrtie Hawk, DO

## 2022-12-14 LAB — CBC
HCT: 31.5 % — ABNORMAL LOW (ref 36.0–46.0)
Hemoglobin: 10.4 g/dL — ABNORMAL LOW (ref 12.0–15.0)
MCH: 28.2 pg (ref 26.0–34.0)
MCHC: 33 g/dL (ref 30.0–36.0)
MCV: 85.4 fL (ref 80.0–100.0)
Platelets: 230 10*3/uL (ref 150–400)
RBC: 3.69 MIL/uL — ABNORMAL LOW (ref 3.87–5.11)
RDW: 13.6 % (ref 11.5–15.5)
WBC: 10.4 10*3/uL (ref 4.0–10.5)
nRBC: 0 % (ref 0.0–0.2)

## 2022-12-14 MED ORDER — OXYCODONE HCL 5 MG PO TABS
5.0000 mg | ORAL_TABLET | Freq: Four times a day (QID) | ORAL | Status: DC | PRN
  Administered 2022-12-14 – 2022-12-15 (×3): 5 mg via ORAL
  Filled 2022-12-14 (×3): qty 1

## 2022-12-14 MED ORDER — NIFEDIPINE ER OSMOTIC RELEASE 30 MG PO TB24
30.0000 mg | ORAL_TABLET | Freq: Two times a day (BID) | ORAL | Status: DC
  Administered 2022-12-14 – 2022-12-15 (×3): 30 mg via ORAL
  Filled 2022-12-14 (×3): qty 1

## 2022-12-14 MED ORDER — POTASSIUM CHLORIDE CRYS ER 20 MEQ PO TBCR: 30.0000 meq | EXTENDED_RELEASE_TABLET | Freq: Two times a day (BID) | ORAL | Status: DC

## 2022-12-14 MED ORDER — POTASSIUM CHLORIDE CRYS ER 20 MEQ PO TBCR
20.0000 meq | EXTENDED_RELEASE_TABLET | Freq: Two times a day (BID) | ORAL | Status: DC
  Administered 2022-12-14 – 2022-12-15 (×2): 20 meq via ORAL
  Filled 2022-12-14 (×2): qty 1

## 2022-12-14 NOTE — Progress Notes (Signed)
Post Partum Day 1 Subjective: Patient reports feeling well ans is without any complaints this morning. She ambulated and denies feeling dizzy or lightheaded. She denies nausea/emesis, headache or visual changes  Objective: Blood pressure (!) 155/98, pulse 74, temperature 98 F (36.7 C), temperature source Oral, resp. rate 18, height 5\' 5"  (1.651 m), weight 81.3 kg, last menstrual period 04/07/2022, SpO2 100 %, unknown if currently breastfeeding.  Physical Exam:  General: alert, cooperative, and no distress Lochia: appropriate Uterine Fundus: firm DVT Evaluation: No evidence of DVT seen on physical exam.  Recent Labs    12/13/22 1310 12/14/22 0504  HGB 11.0* 10.4*  HCT 33.5* 31.5*    Assessment/Plan: Patient PPD#1 s/p SVD on magnesium sulfate for seizure prophylaxis - Elevated BP this morning. Will start procardia 30 BID - Continue magnesium sulfate until 1230 today - Continue close monitoring - Continue routine postpartum care   LOS: 1 day   Catalina Antigua, MD 12/14/2022, 7:50 AM

## 2022-12-14 NOTE — Anesthesia Postprocedure Evaluation (Signed)
Anesthesia Post Note  Patient: Brittany [name]  Procedure(s) Performed: AN AD HOC LABOR EPIDURAL     Patient location during evaluation: Mother Baby Anesthesia Type: Epidural Level of consciousness: awake and alert Pain management: pain level controlled Vital Signs Assessment: post-procedure vital signs reviewed and stable Respiratory status: spontaneous breathing, nonlabored ventilation and respiratory function stable Cardiovascular status: stable Postop Assessment: no headache, no backache and epidural receding Anesthetic complications: no   No notable events documented.  Last Vitals:  Vitals:   12/14/22 0700 12/14/22 0732  BP:  (!) 155/98  Pulse:  74  Resp: 17 18  Temp:  36.7 C  SpO2:      Last Pain:  Vitals:   12/14/22 0732  TempSrc: Oral  PainSc:    Pain Goal: Patients Stated Pain Goal: 2 (12/13/22 0000)                 Fanny Dance

## 2022-12-14 NOTE — Lactation Note (Signed)
This note was copied from a baby's chart. Lactation Consultation Note  Patient Name: Brittany [name] [id number] Date: 12/14/2022 Age:40 hours Reason for consult: Follow-up assessment;Late-preterm 34-36.6wks;Infant < 6lbs;Infant weight loss;Breastfeeding assistance As LC entered the room, mom getting ready to feed the baby, LC offered to check the diaper and change , Large black to mec stool,  LC offered to assist to latch, mom receptive. Baby latched easily and obtained depth, with increased swallows/ compressions. Per mom comfortable. Baby released at 10 mins and then mom supplement with formula.  LC reviewed the [facility] plan for a LPT , > 5 pounds and the need to offer the breast 1st , supplement following the guidelines and post pump 15 mins , save milk for the next feeding.  LC stressed the importance of protecting the babies energy level and weight loss.  LC called [employer] and moms medicaid is not accepted by [employer].  LC sent a WIC referral this am for a DEBP .   Maternal Data    Feeding    LATCH Score Latch: Grasps breast easily, tongue down, lips flanged, rhythmical sucking.  Audible Swallowing: Spontaneous and intermittent  Type of Nipple: Everted at rest and after stimulation  Comfort (Breast/Nipple): Filling, red/small blisters or bruises, mild/mod discomfort  Hold (Positioning): Assistance needed to correctly position infant at breast and maintain latch.  LATCH Score: 8   Lactation Tools Discussed/Used  DEBP , #21 F a good fit .   Interventions Interventions: Breast feeding basics reviewed;Assisted with latch;Skin to skin;Breast massage;Hand express;Breast compression;Adjust position;Support pillows;Position options;Hand pump;DEBP;Education;Pace feeding;LPT handout/interventions;LC Psychologist, educational  Discharge WIC Program: Yes  Consult Status Consult Status: Follow-up Date: 12/15/22 Follow-up type: In-patient    Brittany [name] 12/14/2022, 11:31 AM

## 2022-12-15 ENCOUNTER — Other Ambulatory Visit (HOSPITAL_COMMUNITY): Payer: Self-pay

## 2022-12-15 ENCOUNTER — Ambulatory Visit (HOSPITAL_COMMUNITY): Payer: Self-pay

## 2022-12-15 LAB — COMPREHENSIVE METABOLIC PANEL
ALT: 24 U/L (ref 0–44)
AST: 31 U/L (ref 15–41)
Albumin: 2.8 g/dL — ABNORMAL LOW (ref 3.5–5.0)
Alkaline Phosphatase: 107 U/L (ref 38–126)
Anion gap: 7 (ref 5–15)
BUN: 5 mg/dL — ABNORMAL LOW (ref 6–20)
CO2: 21 mmol/L — ABNORMAL LOW (ref 22–32)
Calcium: 8.1 mg/dL — ABNORMAL LOW (ref 8.9–10.3)
Chloride: 105 mmol/L (ref 98–111)
Creatinine, Ser: 0.68 mg/dL (ref 0.44–1.00)
GFR, Estimated: >60 mL/min (ref >60)
Glucose, Bld: 85 mg/dL (ref 70–99)
Potassium: 3.6 mmol/L (ref 3.5–5.1)
Sodium: 133 mmol/L — ABNORMAL LOW (ref 135–145)
Total Bilirubin: 0.4 mg/dL (ref 0.3–1.2)
Total Protein: 6.8 g/dL (ref 6.5–8.1)

## 2022-12-15 MED ORDER — POLYETHYLENE GLYCOL 3350 17 G PO PACK
17.0000 g | PACK | Freq: Every day | ORAL | Status: DC | PRN
  Administered 2022-12-15: 17 g via ORAL
  Filled 2022-12-15: qty 1

## 2022-12-15 MED ORDER — IBUPROFEN 600 MG PO TABS
600.0000 mg | ORAL_TABLET | Freq: Four times a day (QID) | ORAL | 0 refills | Status: DC
  Filled 2022-12-15: qty 30, 8d supply, fill #0

## 2022-12-15 MED ORDER — NIFEDIPINE ER 30 MG PO TB24
30.0000 mg | ORAL_TABLET | Freq: Two times a day (BID) | ORAL | 0 refills | Status: DC
  Filled 2022-12-15: qty 60, 30d supply, fill #0

## 2022-12-15 MED ORDER — SERTRALINE HCL 25 MG PO TABS: 25.0000 mg | ORAL_TABLET | Freq: Every day | ORAL | 3 refills | Status: DC

## 2022-12-15 MED ORDER — FUROSEMIDE 20 MG PO TABS
20.0000 mg | ORAL_TABLET | Freq: Every day | ORAL | 0 refills | Status: DC
  Filled 2022-12-15: qty 4, 4d supply, fill #0

## 2022-12-15 NOTE — Plan of Care (Signed)
  Problem: Education: Goal: Knowledge of disease or condition will improve Outcome: Completed/Met Goal: Knowledge of the prescribed therapeutic regimen will improve Outcome: Completed/Met   Problem: Fluid Volume: Goal: Peripheral tissue perfusion will improve Outcome: Completed/Met   Problem: Clinical Measurements: Goal: Complications related to disease process, condition or treatment will be avoided or minimized Outcome: Completed/Met   Problem: Health Behavior/Discharge Planning: Goal: Ability to manage health-related needs will improve Outcome: Completed/Met   Problem: Clinical Measurements: Goal: Ability to maintain clinical measurements within normal limits will improve Outcome: Completed/Met Goal: Will remain free from infection Outcome: Completed/Met Goal: Diagnostic test results will improve Outcome: Completed/Met Goal: Respiratory complications will improve Outcome: Completed/Met Goal: Cardiovascular complication will be avoided Outcome: Completed/Met   Problem: Activity: Goal: Risk for activity intolerance will decrease Outcome: Completed/Met   Problem: Elimination: Goal: Will not experience complications related to bowel motility Outcome: Completed/Met   Problem: Pain Managment: Goal: General experience of comfort will improve Outcome: Completed/Met   Problem: Safety: Goal: Ability to remain free from injury will improve Outcome: Completed/Met   Problem: Skin Integrity: Goal: Risk for impaired skin integrity will decrease Outcome: Completed/Met   Problem: Education: Goal: Knowledge of condition will improve Outcome: Completed/Met Goal: Individualized Educational Video(s) Outcome: Completed/Met   Problem: Activity: Goal: Will verbalize the importance of balancing activity with adequate rest periods Outcome: Completed/Met Goal: Ability to tolerate increased activity will improve Outcome: Completed/Met   Problem: Coping: Goal: Ability to identify  and utilize available resources and services will improve Outcome: Completed/Met   Problem: Life Cycle: Goal: Chance of risk for complications during the postpartum period will decrease Outcome: Completed/Met   Problem: Skin Integrity: Goal: Demonstration of wound healing without infection will improve Outcome: Completed/Met

## 2022-12-15 NOTE — Clinical Social Work Maternal (Signed)
CLINICAL SOCIAL WORK MATERNAL/CHILD NOTE  Patient Details  Name: Brittany [name] MRN: [id number] Date of Birth: 1980/09/12  Date:  12/15/2022  Clinical Social Worker Initiating Note:  Blaine Hamper Date/Time: Initiated:  12/15/22/1225     Child's Name:  Brittany [name]   Biological Parents:  Mother   Need for Interpreter:  None   Reason for Referral:  Behavioral Health Concerns, Other (Comment) (Social situation wiith home life.)   Address:  [address] [place] [address]    Phone number:  [phone] (home)     Additional phone number:   Household Members/Support Persons (HM/SP):   Household Member/Support Person 1, Household Member/Support Person 2, Household Member/Support Person 3, Household Member/Support Person 4   HM/SP Name Relationship DOB or Age  HM/SP -1 Ahlstrom, [place] child 06/13/2018  HM/SP -2 Hippe, [place] child 05/03/16  HM/SP -3 Perazzo, [place] child 04/06/11  HM/SP -4 name unknown child 10/21/2005  HM/SP -5 Charles Pounds FOB 05/31/1976  HM/SP -6        HM/SP -7        HM/SP -8          Natural Supports (not living in the home):  Extended Family, Immediate Family, Parent, Friends   Herbalist: None   Employment: Environmental education officer   Type of Work: Freight forwarder   Education:  [employer] school graduate   Homebound arranged:    Surveyor, quantity Resources:  Medicaid   Other Resources:  [employer] (Plans to apply for Food post discharge.)   Cultural/Religious Considerations Which May Impact Care:  None reported   Strengths:  Ability to meet basic needs  , Home prepared for child  , Understanding of illness, Pediatrician chosen   Psychotropic Medications:         Pediatrician:       Pediatrician List:   Radiographer, therapeutic    [place]      Pediatrician Fax Number:    Risk Factors/Current Problems:  Mental Health Concerns     Cognitive State:  Able to Concentrate  ,  Alert  , Insightful  , Linear Thinking  , Goal Oriented     Mood/Affect:  Comfortable  , Flat  , Interested  , Relaxed     CSW Assessment: CSW met with MOB at her bedside to complete an assessment for  EDPS 17 and hx of depression. When CSW arrived, MOB was resting in bed bonding with infant.; infant and MOB appeared comfortable. CSW explained CSW's role and need to complete a clinical assessment.  MOB was polite, easy to engage, and receptive to meeting with CSW.   CSW asked about MOB's MH hx.  MOB openly acknowledged  hx of depression and PPD.  MOB shared that she has experienced auditory hallucination and depression after the birth of her older 4 children and symptoms last for about 6 months.  Per MOB, she has always felt comfortable communicating with her provider and has been prescribed medication (MOB could not recall the name of the medication or the dosage).  MOB expressed an interest in re-starting medication while inpatient since her edinburgh Score is 17.  CSW agreed to speak with provider (CSW spoke with Dr. Ladean Raya).  MOB presented with insight and awareness and did not display any acute MH symptoms. CSW educated MOB about PMADs. CSW informed MOB of possible supports and interventions to decrease PPD.  CSW also  encouraged MOB to seek medical attention if needed for increased signs and symptoms of PPD.  CSW also offered MOB resources for outpatient behavioral health services and MOB declined. CSW encouraged MOB to evaluate her mental health throughout the postpartum period with the use of the New Mom Checklist developed by Postpartum Progress and notify a medical professional if symptoms arise; MOB agreed. CSW assessed for safety and MOB denied SI HI, and DV.  Per MOB she has all essential items to care for infant post discharge.   MOB denied currently having any social situation with her home life.  MOB shared a few months ago she was having difficulty with her oldest son however, the issue  has resolved.   There are no barriers to discharge.   CSW Plan/Description:  Psychosocial Support and Ongoing Assessment of Needs, Sudden Infant Death Syndrome (SIDS) Education, Perinatal Mood and Anxiety Disorder (PMADs) Education, Other Information/Referral to [employer], MSW, [employer] Clinical Social Work [phone]  Barbara Cower, LCSW 12/15/2022, 12:48 PM

## 2022-12-15 NOTE — Lactation Note (Signed)
This note was copied from a baby's chart. Lactation Consultation Note  Patient Name: Brittany [name] [id number] Date: 12/15/2022 Age:40 hours Reason for consult: Follow-up assessment;Late-preterm 34-36.6wks;Infant < 6lbs Mom stated she just finished formula feeding baby. Mom told LC to look in refrigerator at the milk she has pumped. LC praised mom for all of that BM. Asked mom why was she giving formula, she should be giving her BM. Mom stated OK. Mom is mainly bottle feeding baby and pumping all of that milk. LC gave some storage bottles for milk and milk storage reviewed. Encouraged mom to call for questions or concerns or assistance in latching.   Maternal Data    Feeding Mother's Current Feeding Choice: Breast Milk and Formula Nipple Type: Nfant Slow Flow (purple)  LATCH Score                    Lactation Tools Discussed/Used    Interventions    Discharge    Consult Status Consult Status: Follow-up Date: 12/16/22 Follow-up type: In-patient    Charyl Dancer 12/15/2022, 10:46 PM

## 2022-12-16 ENCOUNTER — Telehealth (HOSPITAL_COMMUNITY): Payer: Self-pay | Admitting: *Deleted

## 2022-12-16 ENCOUNTER — Ambulatory Visit (HOSPITAL_COMMUNITY): Payer: Self-pay

## 2022-12-16 DIAGNOSIS — Z1331 Encounter for screening for depression: Secondary | ICD-10-CM

## 2022-12-16 NOTE — Telephone Encounter (Signed)
Hospital inpatient EPDS=17. CSW consult completed during stay. Ambulatory IBH referral made now. Dr. Donavan Foil notified via chart.  Duffy Rhody, RN 12-16-2022 at 11:06am

## 2022-12-16 NOTE — Lactation Note (Signed)
This note was copied from a baby's chart. Lactation Consultation Note  Patient Name: Brittany [name] Date: 12/16/2022 Age:40 hours Reason for consult: Follow-up assessment;Late-preterm 34-36.6wks;Infant < 6lbs;Infant weight loss;Breastfeeding assistance Baby due to feed at 1400 , LC offered to assist to check the diaper . Mom checked and dry. LC and orientee assessed latch , depth achieved and multiple swallows. Baby released at 6 mins and reluctant  to take the supplement of EBM . Per mom will keep trying.  Mom aware to continue her post pumping. Milk is in.  [facility] praised mom for all  her pumping and milk she is getting.   Maternal Data Has patient been taught Hand Expression?: Yes Does the patient have breastfeeding experience prior to this delivery?: Yes  Feeding Mother's Current Feeding Choice: Breast Milk and Formula Nipple Type: Extra Slow Flow  LATCH Score Latch: Grasps breast easily, tongue down, lips flanged, rhythmical sucking.  Audible Swallowing: Spontaneous and intermittent  Type of Nipple: Everted at rest and after stimulation  Comfort (Breast/Nipple): Soft / non-tender  Hold (Positioning): No assistance needed to correctly position infant at breast.  LATCH Score: 10   Lactation Tools Discussed/Used Tools: Pump;Flanges Flange Size: 21 Breast pump type: Double-Electric Breast Pump Pump Education: Setup, frequency, and cleaning;Milk Storage Reason for Pumping: LPT , less than 5 pounds Pumped volume:  (milk is in both breast , mom plans to pump after feeding)  Interventions Interventions: Breast feeding basics reviewed;DEBP;Education  Discharge WIC Program: Yes (per mom will be able to pick up her DEBP Monday , LC shared with mom the [facility] loaner program for Sat D/C)  Consult Status Consult Status: Follow-up Date: 12/17/22 Follow-up type: In-patient    Brittany [name] Johnathan Heskett 12/16/2022, 2:35 PM

## 2022-12-16 NOTE — Telephone Encounter (Signed)
Opened in error.  Duffy Rhody, [place] 12-16-2022 at 10:58am

## 2022-12-17 ENCOUNTER — Ambulatory Visit (HOSPITAL_COMMUNITY): Payer: Self-pay

## 2022-12-17 NOTE — Lactation Note (Signed)
This note was copied from a baby's chart. Lactation Consultation Note  Patient Name: Brittany [name] [id number] Date: 12/17/2022 Age:40 days Reason for consult: Follow-up assessment;Infant weight loss;Late-preterm 34-36.6wks;Infant < 6lbs (1 % weight loss, milk is in) LC reviewed BF D/C plan for a LPT , less than 5 pound infant and recommended following the same [facility] plan mom has been using in the hospital.  LC recommended asking the NP or Pedis if since she is pumping off a lot of volume can she go strickly to EBM for supplementing.  Per mom pumped off 6 oz ( 3 ozs off each breast ) after the baby fed.  LC stressed the importance of protecting her establishing milk supply and to have a DEBP available for D/C.  LC reviewed engorgement prevention and tx.  Per mom has a hand pump and has a appt Monday for [facility] for DEBP .  LC provided the paperwork for a [facility] loaner DEBP this am. Per mom will check with her sister to see if she can help her with the deposit of $30.00.  Mom aware to let the nurse know so they can call the LC.    Feeding Mother's Current Feeding Choice: Breast Milk and Formula  LATCH Score - LC unable to assess latch this am baby recently fed.  LC assessed yesterday and Latch score was 10     Lactation Tools Discussed/Used Tools: Pump Breast pump type: Double-Electric Breast Pump;Manual Pump Education: Milk Storage Pumping frequency: LC stressed the importance of protecting her milk supply and preventing engorgement with the post pumping due the baby being small . Pumped volume: 180 mL (per mom has not yet pumped today.)  Interventions Interventions: Breast feeding basics reviewed;DEBP;Hand pump;LC Services brochure  Discharge Discharge Education: Engorgement and breast care;Warning signs for feeding baby;Outpatient recommendation;Outpatient Epic message sent;Other (comment) (mom receptive for [facility] F/U and aware the [facility] will call her) Pump: Manual [facility] program declined  referal . Medicaid is not in their network. WIC loaner recommended , per mom needs to wait to talk to her sister for deposit $ 30.00) WIC Program: Yes (per mom has a Monday appt with [facility] for a DEBP)  Consult Status Consult Status: Complete Date: 12/17/22    Kathrin Greathouse 12/17/2022, 8:53 AM

## 2022-12-19 ENCOUNTER — Ambulatory Visit: Payer: Medicaid Other

## 2022-12-19 ENCOUNTER — Encounter: Payer: Medicaid Other | Admitting: Obstetrics and Gynecology

## 2022-12-20 ENCOUNTER — Ambulatory Visit: Payer: PRIVATE HEALTH INSURANCE

## 2022-12-21 ENCOUNTER — Telehealth: Payer: Self-pay | Admitting: Clinical

## 2022-12-21 NOTE — Telephone Encounter (Signed)
Attempt call regarding referral; Left HIPPA-compliant message to call back Vladimir Lenhoff from [facility] at  [phone] (Lakishia Bourassa's office).   

## 2022-12-22 ENCOUNTER — Telehealth (HOSPITAL_COMMUNITY): Payer: Self-pay | Admitting: *Deleted

## 2022-12-22 NOTE — Telephone Encounter (Signed)
Attempted hospital discharge follow-up call. Left message for patient to return RN call with any questions or concerns. Deforest Hoyles, RN, 12/22/22, [phone]

## 2022-12-26 ENCOUNTER — Encounter: Payer: Medicaid Other | Admitting: Obstetrics and Gynecology

## 2022-12-28 ENCOUNTER — Encounter: Payer: Self-pay | Admitting: Family

## 2022-12-28 ENCOUNTER — Encounter: Payer: Self-pay | Admitting: Maternal & Fetal Medicine

## 2022-12-28 ENCOUNTER — Encounter: Payer: Self-pay | Admitting: *Deleted

## 2023-01-02 ENCOUNTER — Encounter: Payer: Medicaid Other | Admitting: Family Medicine

## 2023-01-07 ENCOUNTER — Other Ambulatory Visit: Payer: Self-pay | Admitting: Obstetrics and Gynecology

## 2023-01-09 ENCOUNTER — Encounter: Payer: Medicaid Other | Admitting: Obstetrics & Gynecology

## 2023-01-09 ENCOUNTER — Other Ambulatory Visit: Payer: Self-pay | Admitting: Obstetrics and Gynecology

## 2023-01-09 ENCOUNTER — Other Ambulatory Visit: Payer: Self-pay

## 2023-01-10 ENCOUNTER — Other Ambulatory Visit: Payer: Self-pay

## 2023-01-24 ENCOUNTER — Other Ambulatory Visit: Payer: Self-pay | Admitting: Obstetrics and Gynecology

## 2023-01-24 ENCOUNTER — Encounter: Payer: Self-pay | Admitting: Obstetrics & Gynecology

## 2023-01-24 ENCOUNTER — Ambulatory Visit (INDEPENDENT_AMBULATORY_CARE_PROVIDER_SITE_OTHER): Payer: Medicaid Other | Admitting: Obstetrics & Gynecology

## 2023-01-24 ENCOUNTER — Ambulatory Visit: Payer: PRIVATE HEALTH INSURANCE | Admitting: Obstetrics & Gynecology

## 2023-01-24 DIAGNOSIS — Z3202 Encounter for pregnancy test, result negative: Secondary | ICD-10-CM | POA: Diagnosis not present

## 2023-01-24 DIAGNOSIS — O1414 Severe pre-eclampsia complicating childbirth: Secondary | ICD-10-CM

## 2023-01-24 DIAGNOSIS — Z30017 Encounter for initial prescription of implantable subdermal contraceptive: Secondary | ICD-10-CM | POA: Diagnosis not present

## 2023-01-24 DIAGNOSIS — I10 Essential (primary) hypertension: Secondary | ICD-10-CM | POA: Insufficient documentation

## 2023-01-24 LAB — POCT URINE PREGNANCY: Preg Test, Ur: NEGATIVE

## 2023-01-24 MED ORDER — NIFEDIPINE ER 30 MG PO TB24
30.0000 mg | ORAL_TABLET | Freq: Two times a day (BID) | ORAL | 2 refills | Status: DC
Start: 2023-01-24 — End: 2023-08-11

## 2023-01-24 MED ORDER — ETONOGESTREL 68 MG ~~LOC~~ IMPL
68.0000 mg | DRUG_IMPLANT | Freq: Once | SUBCUTANEOUS | Status: AC
Start: 2023-01-24 — End: 2023-01-24
  Administered 2023-01-24: 68 mg via SUBCUTANEOUS

## 2023-01-24 NOTE — Progress Notes (Signed)
Post Partum Visit Note  Brittany [name] is a 42 y.o. [id number] female who presents for a postpartum visit. She is 6 weeks postpartum following a normal spontaneous vaginal delivery.  I have fully reviewed the prenatal and intrapartum course. She had CHTN with superimposed severe preeclampsia. The delivery was at 35 gestational weeks.  Anesthesia: epidural. Postpartum course has been uncomplicated. Baby is doing well. Baby is feeding by both breast and bottle - Breast . Bleeding no bleeding. Bowel function is normal. Bladder function is normal. Patient is not sexually active. Contraception method is none. Postpartum depression screening: borderline due to social situations, but she reports overall improvement.  There was a mixup with her medications, she is taking Labetalol instead of prescribed Nifedipine for her HTN.  Did not take medications today.  Patient denies any headaches, visual symptoms, RUQ/epigastric pain or other concerning symptoms.  The pregnancy intention screening data noted above was reviewed. Potential methods of contraception were discussed. The patient elected to proceed with Nexplanon.   Edinburgh Postnatal Depression Scale - 01/24/23 1317       Edinburgh Postnatal Depression Scale:  In the Past 7 Days   I have been able to laugh and see the funny side of things. 1    I have looked forward with enjoyment to things. 2    I have blamed myself unnecessarily when things went wrong. 0    I have been anxious or worried for no good reason. 0    I have felt scared or panicky for no good reason. 0    Things have been getting on top of me. 2    I have been so unhappy that I have had difficulty sleeping. 2    I have felt sad or miserable. 1    I have been so unhappy that I have been crying. 1    The thought of harming myself has occurred to me. 0    Edinburgh Postnatal Depression Scale Total 9             Health Maintenance Due  Topic Date Due   COVID-19 Vaccine (1)  Never done    The following portions of the patient's history were reviewed and updated as appropriate: allergies, current medications, past family history, past medical history, past social history, past surgical history, and problem list.  Review of Systems Pertinent items noted in HPI and remainder of comprehensive ROS otherwise negative.  Objective:  BP (!) 163/102   Pulse 73   LMP 04/07/2022 (Exact Date)   Breastfeeding Yes   Vitals:   01/24/23 1312 01/24/23 1319  BP: (!) 175/105 (!) 163/102   General:  alert and no distress   Breasts:  normal, done in presence of RN as chaperone  Lungs: clear to auscultation bilaterally  Heart:  regular rate and rhythm  Abdomen: soft, non-tender; bowel sounds normal; no masses,  no organomegaly   GU exam:  not indicated   Nexplanon Insertion Procedure Patient identified, informed consent performed, consent signed.   Patient does understand that irregular bleeding is a very common side effect of this medication. She was advised to have backup contraception for one week after placement. Pregnancy test in clinic today was negative.  Appropriate time out taken.  Patient's left arm was prepped and draped in the usual sterile fashion. The ruler used to measure and mark insertion area.  Patient was prepped with alcohol swab and then injected with 3 ml of 1% lidocaine.  She was prepped  with betadine, Nexplanon removed from packaging,  Device confirmed in needle, then inserted full length of needle and withdrawn per handbook instructions. Nexplanon was able to palpated in the patient's arm; patient palpated the insert herself. There was minimal blood loss.  Patient insertion site covered with guaze and a pressure bandage to reduce any bruising.  The patient tolerated the procedure well and was given post procedure instructions.   Assessment:    1. Hypertension in pregnancy, preeclampsia, severe, delivered - NIFEdipine (ADALAT CC) 30 MG 24 hr tablet; Take 1  tablet (30 mg total) by mouth 2 (two) times daily.  Dispense: 60 tablet; Refill: 2 - Comprehensive metabolic panel - CBC  2. Nexplanon insertion - etonogestrel (NEXPLANON) implant 68 mg  3. Postpartum care following vaginal delivery   Plan:   Essential components of care per ACOG recommendations:  1.  Mood and well being: Patient with negative depression screening today. Reviewed local resources for support.  - Patient tobacco use? No.   - hx of drug use? No.    2. Infant care and feeding:  -Patient currently breastmilk feeding? Yes. Reviewed importance of draining breast regularly to support lactation.  -Social determinants of health (SDOH) reviewed in EPIC. No current concerns.  3. Sexuality, contraception and birth spacing - Patient does not want a pregnancy in the next year.  Desired family size is 5 children.  - Reviewed reproductive life planning. Reviewed contraceptive methods based on pt preferences and effectiveness.  Patient desired Hormonal Implant; Nexplanon placed today and is effective for four years.   - Discussed birth spacing of 18 months  4. Sleep and fatigue -Encouraged family/partner/community support of 4 hrs of uninterrupted sleep to help with mood and fatigue  5. Physical Recovery  - Discussed patients delivery and complications. She describes her labor as good. - Patient had a Vaginal, no problems at delivery. Patient had  no  laceration. Perineal healing reviewed. Patient expressed understanding - Patient has urinary incontinence? No. - Patient is safe to resume physical and sexual activity  6.  Health Maintenance - HM due items addressed Yes - Last pap smear  Diagnosis  Date Value Ref Range Status  02/10/2022   Final   - Negative for intraepithelial lesion or malignancy (NILM)   Pap smear not done at today's visit.  -Breast Cancer screening indicated? No.   7. Chronic Disease/Pregnancy Condition follow up: Hypertension - Nifedipine Rx given to  patient - Labs checked, will follow up results and manage accordingly. - BP check in one week - PCP follow up needed   Jaynie Collins, MD Center for [facility], [facility]

## 2023-01-24 NOTE — Addendum Note (Signed)
Addended by: Kennon Portela on: 01/24/2023 02:08 PM   Modules accepted: Orders

## 2023-01-24 NOTE — Patient Instructions (Signed)
Nexplanon Instructions After Insertion  Keep bandage clean and dry for 24 hours  May use ice/Tylenol/Ibuprofen for soreness or pain  If you develop fever, drainage or increased warmth from incision site-contact office immediately   

## 2023-01-25 LAB — COMPREHENSIVE METABOLIC PANEL
ALT: 16 IU/L (ref 0–32)
AST: 23 IU/L (ref 0–40)
Albumin: 4.1 g/dL (ref 3.9–4.9)
Alkaline Phosphatase: 65 IU/L (ref 44–121)
BUN/Creatinine Ratio: 11 (ref 9–23)
BUN: 9 mg/dL (ref 6–20)
Bilirubin Total: 0.4 mg/dL (ref 0.0–1.2)
CO2: 22 mmol/L (ref 20–29)
Calcium: 9.4 mg/dL (ref 8.7–10.2)
Chloride: 103 mmol/L (ref 96–106)
Creatinine, Ser: 0.82 mg/dL (ref 0.57–1.00)
Globulin, Total: 2.9 g/dL (ref 1.5–4.5)
Glucose: 75 mg/dL (ref 70–99)
Potassium: 4.1 mmol/L (ref 3.5–5.2)
Sodium: 141 mmol/L (ref 134–144)
Total Protein: 7 g/dL (ref 6.0–8.5)
eGFR: 93 mL/min/{1.73_m2} (ref >59)

## 2023-01-25 LAB — CBC
Hematocrit: 38.2 % (ref 34.0–46.6)
Hemoglobin: 12.1 g/dL (ref 11.1–15.9)
MCH: 27 pg (ref 26.6–33.0)
MCHC: 31.7 g/dL (ref 31.5–35.7)
MCV: 85 fL (ref 79–97)
Platelets: 241 10*3/uL (ref 150–450)
RBC: 4.48 x10E6/uL (ref 3.77–5.28)
RDW: 12.6 % (ref 11.7–15.4)
WBC: 6.2 10*3/uL (ref 3.4–10.8)

## 2023-01-26 ENCOUNTER — Other Ambulatory Visit: Payer: Self-pay

## 2023-01-26 ENCOUNTER — Other Ambulatory Visit: Payer: Self-pay | Admitting: Obstetrics and Gynecology

## 2023-01-27 ENCOUNTER — Other Ambulatory Visit: Payer: Self-pay

## 2023-02-28 ENCOUNTER — Other Ambulatory Visit: Payer: Self-pay | Admitting: Family

## 2023-02-28 DIAGNOSIS — Z1231 Encounter for screening mammogram for malignant neoplasm of breast: Secondary | ICD-10-CM

## 2023-03-09 ENCOUNTER — Ambulatory Visit
Admission: RE | Admit: 2023-03-09 | Discharge: 2023-03-09 | Disposition: A | Discharge status: Home / Self Care | Payer: Medicaid Other | Source: Ambulatory Visit

## 2023-03-09 DIAGNOSIS — Z1231 Encounter for screening mammogram for malignant neoplasm of breast: Secondary | ICD-10-CM

## 2023-07-19 NOTE — L&D Delivery Note (Signed)
-------------------------------------------------------------------------------   Attestation signed by Sophia Nicole Brancazio, MD at 03/23/2024  2:18 PM I was immediately available for this vaginal delivery.  Sophia Brancazio, MD MPH [facility] Department of Obstetrics and Gynecology Fellow PGY-6, Division of Maternal Fetal Medicine  ----------------------------  -------------------------------------------------------------------------------  Delivery Note   Delivering Provider:  Brancazio, MD   Resident(s):  Raymonde, MD PGY-3 Anner, MD PGY-1  GA: [redacted]w[redacted]d  Complications: None   Anesthesia: CSE  EBL: 150 cc  Description:  Spontaneous vaginal delivery (SVD) of a viable female infant over the perineum at 1224.  Nuchal cord x 0. Spontaneous cry was noted followed by 60 second delayed cord clamp and placement of infant on maternal chest.  Bulb suction of hypopharynx and nares was completed while infant was dried and stimulated. Cord gases were not sent.    Spontaneous delivery of intact placenta with 3 vessel cord at 1230.  Uterine tone firm with fundal massage.  Findings: APGARs: 8 at 1 minute and 9 at 5 minutes   Birth weight: 1540 g Inspection of the labia majora, perineum, and posterior vaginal wall was performed. The labia major were noted to be intact, bilaterally. No perineal lacerations  The patient tolerated the delivery well.  Infant and mother recovering in stable condition.  Sponge and needle counts were correct at the end of the procedure.   Dr. Brancazio was immediately available for delivery.  Maurilio Levonne Raymonde, MD PGY-3 Obstetrics and Gynecology

## 2023-08-07 ENCOUNTER — Encounter: Payer: Self-pay | Admitting: Family Medicine

## 2023-08-11 ENCOUNTER — Ambulatory Visit (INDEPENDENT_AMBULATORY_CARE_PROVIDER_SITE_OTHER): Payer: Medicaid Other | Admitting: Family Medicine

## 2023-08-11 VITALS — BP 175/107 | Wt 166.4 lb

## 2023-08-11 DIAGNOSIS — N946 Dysmenorrhea, unspecified: Secondary | ICD-10-CM | POA: Diagnosis not present

## 2023-08-11 DIAGNOSIS — I10 Essential (primary) hypertension: Secondary | ICD-10-CM

## 2023-08-11 DIAGNOSIS — Z3046 Encounter for surveillance of implantable subdermal contraceptive: Secondary | ICD-10-CM | POA: Diagnosis not present

## 2023-08-11 DIAGNOSIS — Z3009 Encounter for other general counseling and advice on contraception: Secondary | ICD-10-CM | POA: Diagnosis not present

## 2023-08-11 MED ORDER — NIFEDIPINE ER 30 MG PO TB24
30.0000 mg | ORAL_TABLET | Freq: Two times a day (BID) | ORAL | 12 refills | Status: DC
Start: 2023-08-11 — End: 2023-11-09

## 2023-08-11 MED ORDER — IBUPROFEN 600 MG PO TABS: 600.0000 mg | ORAL_TABLET | Freq: Four times a day (QID) | ORAL | 1 refills | Status: DC

## 2023-08-11 NOTE — Progress Notes (Unsigned)
CC: Bleeding heavy on nexplanon    Bleeding heavy with nexplanon, never had this before   BP high today, hasn't been taking nifedipine

## 2023-08-11 NOTE — Progress Notes (Unsigned)
    Contraception/Family Planning VISIT ENCOUNTER NOTE  Subjective:   Brittany [name] is a 43 y.o. [id number] female here for reproductive life counseling.  Desires less bleeding from [facility].  Reports she does not want a pregnancy in the next year. Reports vaginal bleeding- unpredictable and frequent. Patient does not like Nexplanon despite this being a method she has used previously this experience has not been what she desired. Denies discharge, pelvic pain, problems with intercourse or other gynecologic concerns.    Gynecologic History No LMP recorded. Contraception: Nexplanon  Health Maintenance Due  Topic Date Due   INFLUENZA VACCINE  02/16/2023   COVID-19 Vaccine (1 - 2024-25 season) Never done     The following portions of the patient's history were reviewed and updated as appropriate: allergies, current medications, past family history, past medical history, past social history, past surgical history and problem list.  Review of Systems Pertinent items are noted in HPI.   Objective:  BP (!) 175/107   Wt 166 lb 6.4 oz (75.5 kg)   BMI 27.69 kg/m  Gen: well appearing, NAD HEENT: no scleral icterus CV: RR Lung: Normal WOB Ext: warm well perfused  Nexplanon Removal Patient identified, informed consent performed, consent signed.   Appropriate time out taken. Nexplanon site identified.  Area prepped in usual sterile fashon. One ml of 1% lidocaine was used to anesthetize the area at the distal end of the implant. A small stab incision was made right beside the implant on the distal portion.  The Nexplanon rod was grasped using hemostats and removed without difficulty.  There was minimal blood loss. There were no complications.  3 ml of 1% lidocaine was injected around the incision for post-procedure analgesia.  Steri-strips were applied over the small incision.  A pressure bandage was applied to reduce any bruising.  The patient tolerated the procedure well and was given post procedure  instructions.  Patient is planning to use condoms for contraception/attempt conception.     Assessment and Plan:   Contraception counseling: Reviewed options. Patient does not want another method currently.  Emphasized use of condoms 100% of the time for STI prevention.  1. Primary hypertension (Primary) Counseled strongly about need to control her HTN - NIFEdipine (ADALAT CC) 30 MG 24 hr tablet; Take 1 tablet (30 mg total) by mouth 2 (two) times daily.  Dispense: 60 tablet; Refill: 12  2. Dysmenorrhea - ibuprofen (ADVIL) 600 MG tablet; Take 1 tablet (600 mg total) by mouth every 6 (six) hours.  Dispense: 60 tablet; Refill: 1  3. Nexplanon removal removed  4. Encounter for other general counseling or advice on contraception Reviewed options   Please refer to After Visit Summary for other counseling recommendations.   Return in about 4 weeks (around 09/08/2023) for BP check and contraceptive method start.  No future appointments.   Federico Flake, MD, MPH, ABFM Attending Physician Faculty Practice- Center for [facility]

## 2023-08-13 ENCOUNTER — Encounter: Payer: Self-pay | Admitting: Family Medicine

## 2023-09-09 ENCOUNTER — Inpatient Hospital Stay (HOSPITAL_COMMUNITY): Payer: PRIVATE HEALTH INSURANCE

## 2023-09-09 ENCOUNTER — Other Ambulatory Visit: Payer: Self-pay

## 2023-09-09 ENCOUNTER — Encounter (HOSPITAL_COMMUNITY): Payer: Self-pay

## 2023-09-09 ENCOUNTER — Inpatient Hospital Stay (HOSPITAL_COMMUNITY)
Admission: AD | Admit: 2023-09-09 | Discharge: 2023-09-09 | Disposition: A | Discharge status: Home / Self Care | Payer: PRIVATE HEALTH INSURANCE | Attending: Maternal & Fetal Medicine | Admitting: Maternal & Fetal Medicine

## 2023-09-09 DIAGNOSIS — O26891 Other specified pregnancy related conditions, first trimester: Secondary | ICD-10-CM | POA: Insufficient documentation

## 2023-09-09 DIAGNOSIS — R109 Unspecified abdominal pain: Secondary | ICD-10-CM | POA: Diagnosis not present

## 2023-09-09 DIAGNOSIS — O10011 Pre-existing essential hypertension complicating pregnancy, first trimester: Secondary | ICD-10-CM | POA: Insufficient documentation

## 2023-09-09 DIAGNOSIS — Z3A00 Weeks of gestation of pregnancy not specified: Secondary | ICD-10-CM | POA: Diagnosis not present

## 2023-09-09 DIAGNOSIS — O3680X0 Pregnancy with inconclusive fetal viability, not applicable or unspecified: Secondary | ICD-10-CM | POA: Diagnosis not present

## 2023-09-09 DIAGNOSIS — M545 Low back pain, unspecified: Secondary | ICD-10-CM | POA: Diagnosis present

## 2023-09-09 DIAGNOSIS — Z79899 Other long term (current) drug therapy: Secondary | ICD-10-CM | POA: Diagnosis not present

## 2023-09-09 DIAGNOSIS — M549 Dorsalgia, unspecified: Secondary | ICD-10-CM

## 2023-09-09 DIAGNOSIS — Z3201 Encounter for pregnancy test, result positive: Secondary | ICD-10-CM | POA: Diagnosis present

## 2023-09-09 DIAGNOSIS — R1110 Vomiting, unspecified: Secondary | ICD-10-CM | POA: Diagnosis present

## 2023-09-09 LAB — WET PREP, GENITAL
Clue Cells Wet Prep HPF POC: NONE SEEN
Sperm: NONE SEEN
Trich, Wet Prep: NONE SEEN
WBC, Wet Prep HPF POC: <10 (ref <10)
Yeast Wet Prep HPF POC: NONE SEEN

## 2023-09-09 LAB — HCG, QUANTITATIVE, PREGNANCY: hCG, Beta Chain, Quant, S: 748 m[IU]/mL — ABNORMAL HIGH (ref <5)

## 2023-09-09 LAB — URINALYSIS, ROUTINE W REFLEX MICROSCOPIC
Bilirubin Urine: NEGATIVE
Glucose, UA: NEGATIVE mg/dL
Hgb urine dipstick: NEGATIVE
Ketones, ur: NEGATIVE mg/dL
Leukocytes,Ua: NEGATIVE
Nitrite: NEGATIVE
Protein, ur: NEGATIVE mg/dL
Specific Gravity, Urine: 1.018 (ref 1.005–1.030)
pH: 7 (ref 5.0–8.0)

## 2023-09-09 LAB — CBC
HCT: 36.9 % (ref 36.0–46.0)
Hemoglobin: 12 g/dL (ref 12.0–15.0)
MCH: 28 pg (ref 26.0–34.0)
MCHC: 32.5 g/dL (ref 30.0–36.0)
MCV: 86 fL (ref 80.0–100.0)
Platelets: 252 10*3/uL (ref 150–400)
RBC: 4.29 MIL/uL (ref 3.87–5.11)
RDW: 13.3 % (ref 11.5–15.5)
WBC: 7 10*3/uL (ref 4.0–10.5)
nRBC: 0 % (ref 0.0–0.2)

## 2023-09-09 LAB — POCT PREGNANCY, URINE: Preg Test, Ur: POSITIVE — AB

## 2023-09-09 LAB — HIV ANTIBODY (ROUTINE TESTING W REFLEX): HIV Screen 4th Generation wRfx: NONREACTIVE

## 2023-09-09 NOTE — MAU Provider Note (Signed)
  History     CSN: [id number]  Arrival date and time: 09/09/23 4098   Event Date/Time   First Provider Initiated Contact with Patient 09/09/23 1151      Chief Complaint  Patient presents with  . Abdominal Pain  . Back Pain  . Possible Pregnancy   HPI  {GYN/OB [id number]}  Past Medical History:  Diagnosis Date  . Anemia 11/30/2016  . Cervical dysplasia    Had LEEP  . Gallstone   . Heart murmur   . Hypertension   . Migraine 03/18/2016   Needs referral to headache specialist  . Murmur, cardiac 11/30/2016   Cardiology evaluation: normal 12/2017  . Severe preeclampsia   . Subclinical hyperthyroidism 12/09/2015  . UTI (urinary tract infection)     Past Surgical History:  Procedure Laterality Date  . DILATION AND CURETTAGE OF UTERUS    . LEEP      Family History  Problem Relation Age of Onset  . Asthma Mother   . Hypertension Mother   . CAD Mother   . Asthma Maternal Grandmother   . Hypertension Maternal Grandmother     Social History   Tobacco Use  . Smoking status: Never  . Smokeless tobacco: Never  Vaping Use  . Vaping status: Never Used  Substance Use Topics  . Alcohol use: No    Alcohol/week: 0.0 standard drinks of alcohol  . Drug use: No    Allergies: No Known Allergies  Medications Prior to Admission  Medication Sig Dispense Refill Last Dose/Taking  . furosemide (LASIX) 20 MG tablet Take 1 tablet (20 mg total) by mouth daily. (Patient not taking: Reported on 08/11/2023) 4 tablet 0   . ibuprofen (ADVIL) 600 MG tablet Take 1 tablet (600 mg total) by mouth every 6 (six) hours. 60 tablet 1   . NIFEdipine (ADALAT CC) 30 MG 24 hr tablet Take 1 tablet (30 mg total) by mouth 2 (two) times daily. 60 tablet 12   . sertraline (ZOLOFT) 25 MG tablet Take 1 tablet (25 mg total) by mouth daily. (Patient not taking: Reported on 08/11/2023) 30 tablet 3     Review of Systems Physical Exam   Blood pressure (!) 151/79, pulse 70, temperature 98 F (36.7 C),  temperature source Oral, resp. rate 12, weight 76.3 kg, SpO2 100%, currently breastfeeding.  Physical Exam  MAU Course  Procedures  MDM ***  Assessment and Plan  ***  Raelyn Mora, CNM 09/09/2023, 11:51 AM

## 2023-09-09 NOTE — MAU Note (Signed)
.  Brittany [name] is a 43 y.o. at Unknown here in MAU reporting: Patient reports she took a pregnancy test on Thursday and it was positive. Patient reports lower back pain 6/10 that started 2 days ago.  LMP: patient is unsure had nexplanon removed on 08/11/2023 Onset of complaint: two days ago  Pain score: 6/10 There were no vitals filed for this visit.   Lab orders placed from triage:   pregnancy test

## 2023-09-09 NOTE — Discharge Instructions (Signed)
 Your pregnancy location has not been determined at this time. We recommend that you have a repeat ultrasound in 10 days.

## 2023-09-11 ENCOUNTER — Inpatient Hospital Stay (HOSPITAL_COMMUNITY): Payer: PRIVATE HEALTH INSURANCE | Attending: Maternal & Fetal Medicine

## 2023-09-11 ENCOUNTER — Inpatient Hospital Stay (HOSPITAL_COMMUNITY)
Admission: AD | Admit: 2023-09-11 | Discharge: 2023-09-11 | Disposition: A | Discharge status: Home / Self Care | Payer: PRIVATE HEALTH INSURANCE | Attending: Obstetrics and Gynecology | Admitting: Obstetrics and Gynecology

## 2023-09-11 DIAGNOSIS — I10 Essential (primary) hypertension: Secondary | ICD-10-CM | POA: Diagnosis present

## 2023-09-11 DIAGNOSIS — O10011 Pre-existing essential hypertension complicating pregnancy, first trimester: Secondary | ICD-10-CM | POA: Insufficient documentation

## 2023-09-11 DIAGNOSIS — O3680X0 Pregnancy with inconclusive fetal viability, not applicable or unspecified: Secondary | ICD-10-CM

## 2023-09-11 DIAGNOSIS — Z3A01 Less than 8 weeks gestation of pregnancy: Secondary | ICD-10-CM

## 2023-09-11 LAB — GC/CHLAMYDIA PROBE AMP (~~LOC~~) NOT AT ARMC
Chlamydia: NEGATIVE
Comment: NEGATIVE
Comment: NORMAL
Neisseria Gonorrhea: NEGATIVE

## 2023-09-11 LAB — HCG, QUANTITATIVE, PREGNANCY: hCG, Beta Chain, Quant, S: 2788 m[IU]/mL — ABNORMAL HIGH (ref <5)

## 2023-09-11 NOTE — Discharge Instructions (Signed)
 You were seen in the MAU for pregnancy of unknown location. We did a blood test, and after discussion you elected to be notified of the results by telephone.   If you develop severe and progressively worsening abdominal pain, heavy vaginal bleeding soaking >1 pad/hour, or fever, return to the MAU or the nearest hospital immediately.

## 2023-09-11 NOTE — MAU Provider Note (Signed)
 History     [id number]  Arrival date and time: 09/11/23 4696    Chief Complaint  Patient presents with   Labs Only     HPI Brittany [name] is a 43 y.o. at Unknown gestational age, who presents for hcg recheck.   Patient seen in MAU two days prior, hcg was 748 with no pregnancy seen on [place] Here today for recheck No abdominal pain No bleeding   --/--/O POS (05/28 0000)  OB History     Gravida  11   Para  5   Term  2   Preterm  3   AB  5   Living  5      SAB  4   IAB  1   Ectopic  0   Multiple  0   Live Births  5           Past Medical History:  Diagnosis Date   Anemia 11/30/2016   Cervical dysplasia    Had LEEP   Gallstone    Heart murmur    Hypertension    Migraine 03/18/2016   Needs referral to headache specialist   Murmur, cardiac 11/30/2016   Cardiology evaluation: normal 12/2017   Severe preeclampsia    Subclinical hyperthyroidism 12/09/2015   UTI (urinary tract infection)     Past Surgical History:  Procedure Laterality Date   DILATION AND CURETTAGE OF UTERUS     LEEP      Family History  Problem Relation Age of Onset   Asthma Mother    Hypertension Mother    CAD Mother    Asthma Maternal Grandmother    Hypertension Maternal Grandmother     Social History   Socioeconomic History   Marital status: Married    Spouse name: Not on file   Number of children: Not on file   Years of education: Not on file   Highest education level: Not on file  Occupational History   Not on file  Tobacco Use   Smoking status: Never   Smokeless tobacco: Never  Vaping Use   Vaping status: Never Used  Substance and Sexual Activity   Alcohol use: No    Alcohol/week: 0.0 standard drinks of alcohol   Drug use: No   Sexual activity: Yes  Other Topics Concern   Not on file  Social History Narrative   Not on file   Social Drivers of Health   Financial Resource Strain: Not on file  Food Insecurity: No Food Insecurity (12/13/2022)    Hunger Vital Sign    Worried About Running Out of Food in the Last Year: Never true    Ran Out of Food in the Last Year: Never true  Transportation Needs: No Transportation Needs (12/13/2022)   PRAPARE - Administrator, Civil Service (Medical): No    Lack of Transportation (Non-Medical): No  Physical Activity: Not on file  Stress: Not on file  Social Connections: Not on file  Intimate Partner Violence: Not At Risk (12/13/2022)   Humiliation, Afraid, Rape, and Kick questionnaire    Fear of Current or Ex-Partner: No    Emotionally Abused: No    Physically Abused: No    Sexually Abused: No    No Known Allergies  No current facility-administered medications on file prior to encounter.   Current Outpatient Medications on File Prior to Encounter  Medication Sig Dispense Refill   NIFEdipine (ADALAT CC) 30 MG 24 hr tablet Take 1 tablet (30 mg total)  by mouth 2 (two) times daily. 60 tablet 12   sertraline (ZOLOFT) 25 MG tablet Take 1 tablet (25 mg total) by mouth daily. (Patient not taking: Reported on 08/11/2023) 30 tablet 3   [DISCONTINUED] cetirizine (ZYRTEC ALLERGY) 10 MG tablet Take 1 tablet (10 mg total) by mouth daily. 30 tablet 2   [DISCONTINUED] ipratropium (ATROVENT) 0.06 % nasal spray Place 2 sprays into both nostrils 4 (four) times daily. 15 mL 0     ROS Pertinent positives and negative per HPI, all others reviewed and negative  Physical Exam   BP (!) 148/88 (BP Location: Right Arm)   Pulse 69   Temp 98.3 F (36.8 C) (Oral)   Resp 18   Ht 5\' 5"  (1.651 m)   Wt 76.4 kg   SpO2 100%   BMI 28.02 kg/m   Patient Vitals for the past 24 hrs:  BP Temp Temp src Pulse Resp SpO2 Height Weight  09/11/23 1934 (!) 148/88 98.3 F (36.8 C) Oral 69 18 100 % 5\' 5"  (1.651 m) 76.4 kg    Physical Exam Vitals reviewed.  Constitutional:      General: She is not in acute distress.    Appearance: She is well-developed. She is not diaphoretic.  Eyes:     General: No scleral  icterus. Pulmonary:     Effort: Pulmonary effort is normal. No respiratory distress.  Skin:    General: Skin is warm and dry.  Neurological:     Mental Status: She is alert.     Coordination: Coordination normal.      Cervical Exam    Bedside Ultrasound Not performed.  My interpretation: n/a  FHT N/a  Labs No results found for this or any previous visit (from the past 24 hours).  Imaging No results found.  MAU Course  Procedures Lab Orders         hCG, quantitative, pregnancy    No orders of the defined types were placed in this encounter.  Imaging Orders  No imaging studies ordered today    MDM Moderate (Level 3-4)  Assessment and Plan  #Pregnancy of unknown location #[redacted] weeks gestation of pregnancy Pregnancy of unknown location. Discussed with patient that the possibilities include ectopic pregnancy, normal pregnancy, or miscarriage. We discussed need to trend HCG to help determine the outcome of this pregnancy. Patient desired to be called with results. She understands that ectopic pregnancy is a life threatening condition if not diagnosed and treated in a timely manner.    Dispo: discharged to home in stable condition    Venora Maples, MD/MPH 09/11/23 8:13 PM  Allergies as of 09/11/2023   No Known Allergies      Medication List     TAKE these medications    NIFEdipine 30 MG 24 hr tablet Commonly known as: ADALAT CC Take 1 tablet (30 mg total) by mouth 2 (two) times daily.   sertraline 25 MG tablet Commonly known as: Zoloft Take 1 tablet (25 mg total) by mouth daily.

## 2023-09-11 NOTE — MAU Note (Signed)
..  Brittany [name] is a 42 y.o. at Unknown here in MAU reporting: here for repeat blood work. Denies pain or vaginal bleeding.  Reports CHTN, has not taken night time dose  Pain score: 0/10 Vitals:   09/11/23 1934  BP: (!) 148/88  Pulse: 69  Resp: 18  Temp: 98.3 F (36.8 C)  SpO2: 100%

## 2023-09-12 ENCOUNTER — Encounter: Payer: Self-pay | Admitting: Obstetrics and Gynecology

## 2023-09-12 ENCOUNTER — Other Ambulatory Visit: Payer: Self-pay | Admitting: *Deleted

## 2023-09-12 MED ORDER — PRENATAL 28-0.8 MG PO TABS: 1.0000 | ORAL_TABLET | Freq: Every day | ORAL | 12 refills | Status: DC

## 2023-09-14 ENCOUNTER — Other Ambulatory Visit: Payer: Medicaid Other

## 2023-09-14 DIAGNOSIS — O3680X0 Pregnancy with inconclusive fetal viability, not applicable or unspecified: Secondary | ICD-10-CM

## 2023-09-15 ENCOUNTER — Encounter: Payer: Self-pay | Admitting: Obstetrics and Gynecology

## 2023-09-15 LAB — BETA HCG QUANT (REF LAB): hCG Quant: 5646 m[IU]/mL

## 2023-09-20 ENCOUNTER — Other Ambulatory Visit: Payer: Self-pay | Admitting: *Deleted

## 2023-09-20 ENCOUNTER — Encounter: Payer: Self-pay | Admitting: Obstetrics and Gynecology

## 2023-09-20 MED ORDER — PROMETHAZINE HCL 25 MG PO TABS: 25.0000 mg | ORAL_TABLET | Freq: Four times a day (QID) | ORAL | 2 refills | Status: DC | PRN

## 2023-09-20 NOTE — Progress Notes (Signed)
 Sent RX to wrong pharmacy, sent to [facility] per pt request

## 2023-09-21 ENCOUNTER — Other Ambulatory Visit: Payer: Self-pay

## 2023-09-21 ENCOUNTER — Ambulatory Visit (INDEPENDENT_AMBULATORY_CARE_PROVIDER_SITE_OTHER): Payer: PRIVATE HEALTH INSURANCE

## 2023-09-21 ENCOUNTER — Other Ambulatory Visit: Payer: Self-pay | Admitting: *Deleted

## 2023-09-21 DIAGNOSIS — O3680X0 Pregnancy with inconclusive fetal viability, not applicable or unspecified: Secondary | ICD-10-CM

## 2023-09-21 DIAGNOSIS — Z3A01 Less than 8 weeks gestation of pregnancy: Secondary | ICD-10-CM | POA: Diagnosis not present

## 2023-09-21 DIAGNOSIS — Z3491 Encounter for supervision of normal pregnancy, unspecified, first trimester: Secondary | ICD-10-CM

## 2023-09-25 ENCOUNTER — Encounter: Payer: Self-pay | Admitting: Obstetrics and Gynecology

## 2023-09-27 ENCOUNTER — Inpatient Hospital Stay (HOSPITAL_COMMUNITY)
Admission: AD | Admit: 2023-09-27 | Discharge: 2023-09-28 | Disposition: A | Discharge status: Home / Self Care | Payer: PRIVATE HEALTH INSURANCE | Attending: Obstetrics and Gynecology | Admitting: Obstetrics and Gynecology

## 2023-09-27 DIAGNOSIS — O26891 Other specified pregnancy related conditions, first trimester: Secondary | ICD-10-CM | POA: Diagnosis not present

## 2023-09-27 DIAGNOSIS — R102 Pelvic and perineal pain: Secondary | ICD-10-CM | POA: Diagnosis not present

## 2023-09-27 DIAGNOSIS — R109 Unspecified abdominal pain: Secondary | ICD-10-CM | POA: Diagnosis present

## 2023-09-27 DIAGNOSIS — Z3A01 Less than 8 weeks gestation of pregnancy: Secondary | ICD-10-CM | POA: Insufficient documentation

## 2023-09-27 DIAGNOSIS — N83292 Other ovarian cyst, left side: Secondary | ICD-10-CM | POA: Diagnosis not present

## 2023-09-27 DIAGNOSIS — N83202 Unspecified ovarian cyst, left side: Secondary | ICD-10-CM

## 2023-09-27 DIAGNOSIS — R1032 Left lower quadrant pain: Secondary | ICD-10-CM | POA: Insufficient documentation

## 2023-09-27 LAB — URINALYSIS, ROUTINE W REFLEX MICROSCOPIC
Bilirubin Urine: NEGATIVE
Glucose, UA: NEGATIVE mg/dL
Hgb urine dipstick: NEGATIVE
Ketones, ur: 5 mg/dL — AB
Leukocytes,Ua: NEGATIVE
Nitrite: NEGATIVE
Protein, ur: NEGATIVE mg/dL
Specific Gravity, Urine: 1.025 (ref 1.005–1.030)
pH: 5 (ref 5.0–8.0)

## 2023-09-27 MED ORDER — HYOSCYAMINE SULFATE 0.125 MG SL SUBL
0.1250 mg | SUBLINGUAL_TABLET | Freq: Once | SUBLINGUAL | Status: AC
  Administered 2023-09-27: 0.125 mg via SUBLINGUAL
  Filled 2023-09-27: qty 1

## 2023-09-27 MED ORDER — SIMETHICONE 80 MG PO CHEW
80.0000 mg | CHEWABLE_TABLET | Freq: Once | ORAL | Status: AC
  Administered 2023-09-27: 80 mg via ORAL
  Filled 2023-09-27: qty 1

## 2023-09-27 NOTE — MAU Note (Signed)
..  Brittany [name] is a 43 y.o. at Unknown here in MAU reporting: left sided cramping that began this afternoon the pain is constant and reports that when she rubs the upper left side it feels " tight, balled up" feeling Denies vaginal bleeding.  LMP: 08/11/2023  Pain score: 8/10 Vitals:   09/27/23 2119  BP: 130/71  Pulse: 83  Resp: 17  Temp: 99.1 F (37.3 C)  SpO2: 100%     FHT:n/a Lab orders placed from triage:  ua

## 2023-09-28 ENCOUNTER — Encounter: Payer: Self-pay | Admitting: Obstetrics and Gynecology

## 2023-09-28 ENCOUNTER — Inpatient Hospital Stay (HOSPITAL_COMMUNITY): Payer: PRIVATE HEALTH INSURANCE

## 2023-09-28 DIAGNOSIS — O26891 Other specified pregnancy related conditions, first trimester: Secondary | ICD-10-CM

## 2023-09-28 DIAGNOSIS — R102 Pelvic and perineal pain: Secondary | ICD-10-CM

## 2023-09-28 DIAGNOSIS — N83202 Unspecified ovarian cyst, left side: Secondary | ICD-10-CM

## 2023-09-28 DIAGNOSIS — Z3A01 Less than 8 weeks gestation of pregnancy: Secondary | ICD-10-CM

## 2023-09-28 MED ORDER — SIMETHICONE 80 MG PO CHEW: 80.0000 mg | CHEWABLE_TABLET | Freq: Four times a day (QID) | ORAL | 0 refills | Status: DC | PRN

## 2023-09-28 NOTE — MAU Provider Note (Signed)
 Chief Complaint: Abdominal Pain   Event Date/Time   First Provider Initiated Contact with Patient 09/28/23 0020        SUBJECTIVE HPI: Brittany [name] is a 43 y.o. [id number] at early gestation by LMP who presents to maternity admissions reporting pain in left lower abdomen. Wants to make sure the baby is alright. . She denies vaginal bleeding, vaginal itching/burning, urinary symptoms, h/a, dizziness, n/v, or fever/chills.     Abdominal Pain This is a new problem. The current episode started today. The problem occurs constantly. The pain is located in the LLQ. The abdominal pain does not radiate. Pertinent negatives include no constipation, diarrhea or dysuria. Nothing aggravates the pain. The pain is relieved by Nothing. She has tried nothing for the symptoms.   RN Note: Brittany [name] is a 43 y.o. at Unknown here in MAU reporting: left sided cramping that began this afternoon the pain is constant and reports that when she rubs the upper left side it feels " tight, balled up" feeling Denies vaginal bleeding.  LMP: 08/11/2023  Past Medical History:  Diagnosis Date   Anemia 11/30/2016   Cervical dysplasia    Had LEEP   Gallstone    Heart murmur    Hypertension    Migraine 03/18/2016   Needs referral to headache specialist   Murmur, cardiac 11/30/2016   Cardiology evaluation: normal 12/2017   Severe preeclampsia    Subclinical hyperthyroidism 12/09/2015   UTI (urinary tract infection)    Past Surgical History:  Procedure Laterality Date   DILATION AND CURETTAGE OF UTERUS     LEEP     Social History   Socioeconomic History   Marital status: Married    Spouse name: Not on file   Number of children: Not on file   Years of education: Not on file   Highest education level: Not on file  Occupational History   Not on file  Tobacco Use   Smoking status: Never   Smokeless tobacco: Never  Vaping Use   Vaping status: Never Used  Substance and Sexual Activity   Alcohol use:  No    Alcohol/week: 0.0 standard drinks of alcohol   Drug use: No   Sexual activity: Yes  Other Topics Concern   Not on file  Social History Narrative   Not on file   Social Drivers of Health   Financial Resource Strain: Not on file  Food Insecurity: No Food Insecurity (12/13/2022)   Hunger Vital Sign    Worried About Running Out of Food in the Last Year: Never true    Ran Out of Food in the Last Year: Never true  Transportation Needs: No Transportation Needs (12/13/2022)   PRAPARE - Administrator, Civil Service (Medical): No    Lack of Transportation (Non-Medical): No  Physical Activity: Not on file  Stress: Not on file  Social Connections: Not on file  Intimate Partner Violence: Not At Risk (12/13/2022)   Humiliation, Afraid, Rape, and Kick questionnaire    Fear of Current or Ex-Partner: No    Emotionally Abused: No    Physically Abused: No    Sexually Abused: No   No current facility-administered medications on file prior to encounter.   Current Outpatient Medications on File Prior to Encounter  Medication Sig Dispense Refill   NIFEdipine (ADALAT CC) 30 MG 24 hr tablet Take 1 tablet (30 mg total) by mouth 2 (two) times daily. 60 tablet 12   Prenatal 28-0.8 MG TABS Take  1 tablet by mouth daily. 30 tablet 12   promethazine (PHENERGAN) 25 MG tablet Take 1 tablet (25 mg total) by mouth every 6 (six) hours as needed for nausea or vomiting. 30 tablet 2   sertraline (ZOLOFT) 25 MG tablet Take 1 tablet (25 mg total) by mouth daily. (Patient not taking: Reported on 08/11/2023) 30 tablet 3   [DISCONTINUED] cetirizine (ZYRTEC ALLERGY) 10 MG tablet Take 1 tablet (10 mg total) by mouth daily. 30 tablet 2   [DISCONTINUED] ipratropium (ATROVENT) 0.06 % nasal spray Place 2 sprays into both nostrils 4 (four) times daily. 15 mL 0   No Known Allergies  I have reviewed patient's Past Medical Hx, Surgical Hx, Family Hx, Social Hx, medications and allergies.   ROS:  Review of  Systems  Gastrointestinal:  Positive for abdominal pain. Negative for constipation and diarrhea.  Genitourinary:  Negative for dysuria.   Review of Systems  Other systems negative   Physical Exam  Physical Exam Patient Vitals for the past 24 hrs:  BP Temp Temp src Pulse Resp SpO2  09/27/23 2119 130/71 99.1 F (37.3 C) Oral 83 17 100 %   Constitutional: Well-developed, well-nourished female in no acute distress.  Cardiovascular: normal rate Respiratory: normal effort GI: Abd soft, non-tender.  MS: Extremities nontender, no edema, normal ROM Neurologic: Alert and oriented x 4.  GU: Neg CVAT.  PELVIC EXAM: deferred in lieu of [place]  LAB RESULTS Results for orders placed or performed during the hospital encounter of 09/27/23 (from the past 24 hours)  Urinalysis, Routine w reflex microscopic -Urine, Clean Catch     Status: Abnormal   Collection Time: 09/27/23  9:24 PM  Result Value Ref Range   Color, Urine YELLOW YELLOW   APPearance HAZY (A) CLEAR   Specific Gravity, Urine 1.025 1.005 - 1.030   pH 5.0 5.0 - 8.0   Glucose, UA NEGATIVE NEGATIVE mg/dL   Hgb urine dipstick NEGATIVE NEGATIVE   Bilirubin Urine NEGATIVE NEGATIVE   Ketones, ur 5 (A) NEGATIVE mg/dL   Protein, ur NEGATIVE NEGATIVE mg/dL   Nitrite NEGATIVE NEGATIVE   Leukocytes,Ua NEGATIVE NEGATIVE    --/--/O POS (05/28 0000)  IMAGING US OB Transvaginal Result Date: 09/28/2023 CLINICAL DATA:  149326 LLQ pain 149326 EXAM: OBSTETRIC <14 WK [place] AND TRANSVAGINAL OB US TECHNIQUE: Both transabdominal and transvaginal ultrasound examinations were performed for complete evaluation of the gestation as well as the maternal uterus, adnexal regions, and pelvic cul-de-sac. Transvaginal technique was performed to assess early pregnancy. COMPARISON:  CT renal 09/04/2022. ultrasound Ob 09/21/2023 FINDINGS: Intrauterine gestational sac: Single Yolk sac:  Visualized. Embryo:  Visualized. Cardiac Activity: Visualized. Heart Rate: 135 bpm  CRL:  10.1 mm   7 w   1 d                  [place] EDC: 05/15/2024 Subchorionic hemorrhage:  None visualized. Maternal uterus/adnexae: There is a simple left ovarian cyst measuring 5.6 x 4.7 x 5.8 cm. Otherwise bilateral ovaries unremarkable. IMPRESSION: 1. Single live intrauterine pregnancy with gestational age by ultrasound of 7 weeks and 1 day which is concordant with assigned gestational age of [redacted] weeks and 6 days. 2. A 5.8 cm simple left ovarian cyst. Electronically Signed   By: Tish Frederickson M.D.   On: 09/28/2023 01:31     MAU Management/MDM: I have reviewed the triage vital signs and the nursing notes.   Pertinent labs & imaging results that were available during my care of the patient were  reviewed by me and considered in my medical decision making (see chart for details).      I have reviewed her medical records including past results, notes and treatments. Medical, Surgical, and family history were reviewed.  Medications and recent lab tests were reviewed  Ordered Ultrasound to rule out abnormality Discussed normal development of fetus and presence of left moderately large ovarian simple cyst    ASSESSMENT Single IUP at [redacted]w[redacted]d Left lower abdominal pain Left mod large simple ovarian cyst   PLAN Discharge home Tylenol prn pain  Pt stable at time of discharge. Encouraged to return here if she develops worsening of symptoms, increase in pain, fever, or other concerning symptoms.    Wynelle Bourgeois CNM, MSN Certified Nurse-Midwife 09/28/2023  12:20 AM

## 2023-09-29 ENCOUNTER — Encounter: Payer: Self-pay | Admitting: Obstetrics and Gynecology

## 2023-09-29 ENCOUNTER — Other Ambulatory Visit: Payer: Self-pay | Admitting: *Deleted

## 2023-09-29 MED ORDER — SIMETHICONE 80 MG PO CHEW: 80.0000 mg | CHEWABLE_TABLET | Freq: Four times a day (QID) | ORAL | 0 refills | Status: DC | PRN

## 2023-09-29 NOTE — Progress Notes (Signed)
 Sent to wrong pharmacy.

## 2023-09-30 ENCOUNTER — Inpatient Hospital Stay (HOSPITAL_COMMUNITY): Payer: PRIVATE HEALTH INSURANCE

## 2023-09-30 ENCOUNTER — Inpatient Hospital Stay (HOSPITAL_COMMUNITY)
Admission: AD | Admit: 2023-09-30 | Discharge: 2023-09-30 | Disposition: A | Discharge status: Home / Self Care | Payer: PRIVATE HEALTH INSURANCE | Attending: Obstetrics and Gynecology | Admitting: Obstetrics and Gynecology

## 2023-09-30 DIAGNOSIS — N83202 Unspecified ovarian cyst, left side: Secondary | ICD-10-CM | POA: Insufficient documentation

## 2023-09-30 DIAGNOSIS — Z3A01 Less than 8 weeks gestation of pregnancy: Secondary | ICD-10-CM | POA: Diagnosis not present

## 2023-09-30 DIAGNOSIS — R109 Unspecified abdominal pain: Secondary | ICD-10-CM

## 2023-09-30 DIAGNOSIS — O26891 Other specified pregnancy related conditions, first trimester: Secondary | ICD-10-CM | POA: Diagnosis present

## 2023-09-30 DIAGNOSIS — O3481 Maternal care for other abnormalities of pelvic organs, first trimester: Secondary | ICD-10-CM | POA: Diagnosis not present

## 2023-09-30 LAB — URINALYSIS, ROUTINE W REFLEX MICROSCOPIC
Bilirubin Urine: NEGATIVE
Glucose, UA: NEGATIVE mg/dL
Hgb urine dipstick: NEGATIVE
Ketones, ur: NEGATIVE mg/dL
Nitrite: NEGATIVE
Protein, ur: NEGATIVE mg/dL
Specific Gravity, Urine: 1.013 (ref 1.005–1.030)
pH: 6 (ref 5.0–8.0)

## 2023-09-30 MED ORDER — CYCLOBENZAPRINE HCL 10 MG PO TABS: 10.0000 mg | ORAL_TABLET | Freq: Three times a day (TID) | ORAL | 0 refills | Status: DC | PRN

## 2023-09-30 MED ORDER — CYCLOBENZAPRINE HCL 5 MG PO TABS
10.0000 mg | ORAL_TABLET | Freq: Once | ORAL | Status: AC
  Administered 2023-09-30: 10 mg via ORAL
  Filled 2023-09-30: qty 2

## 2023-09-30 MED ORDER — ACETAMINOPHEN 325 MG PO TABS
650.0000 mg | ORAL_TABLET | Freq: Once | ORAL | Status: AC
  Administered 2023-09-30: 650 mg via ORAL
  Filled 2023-09-30: qty 2

## 2023-09-30 NOTE — MAU Provider Note (Signed)
 History     CSN: [id number]  Arrival date and time: 09/30/23 1835   Event Date/Time   First Provider Initiated Contact with Patient 09/30/23 2151      Chief Complaint  Patient presents with   Abdominal Pain    Brittany [name] is a 43 y.o. [id number] at [redacted]w[redacted]d who receives care at [facility].  She presents today for abdominal pain. She reports the pain has remained constant since last visit on 09/28/2023.  She describes the pain as throbbing pain that is worsened with position changes and has no relieving factors.  She rates the pain a 7/10. She states she took tylenol around 3pm, but did not receive any relief. Patient denies vaginal bleeding or discharge. No issues with urination, constipation, or diarrhea.    OB History     Gravida  11   Para  5   Term  2   Preterm  3   AB  5   Living  5      SAB  4   IAB  1   Ectopic  0   Multiple  0   Live Births  5           Past Medical History:  Diagnosis Date   Anemia 11/30/2016   Cervical dysplasia    Had LEEP   Gallstone    Heart murmur    Hypertension    Migraine 03/18/2016   Needs referral to headache specialist   Murmur, cardiac 11/30/2016   Cardiology evaluation: normal 12/2017   Severe preeclampsia    Subclinical hyperthyroidism 12/09/2015   UTI (urinary tract infection)     Past Surgical History:  Procedure Laterality Date   DILATION AND CURETTAGE OF UTERUS     LEEP      Family History  Problem Relation Age of Onset   Asthma Mother    Hypertension Mother    CAD Mother    Asthma Maternal Grandmother    Hypertension Maternal Grandmother     Social History   Tobacco Use   Smoking status: Never   Smokeless tobacco: Never  Vaping Use   Vaping status: Never Used  Substance Use Topics   Alcohol use: No    Alcohol/week: 0.0 standard drinks of alcohol   Drug use: No    Allergies: No Known Allergies  Medications Prior to Admission  Medication Sig Dispense Refill Last Dose/Taking    NIFEdipine (ADALAT CC) 30 MG 24 hr tablet Take 1 tablet (30 mg total) by mouth 2 (two) times daily. 60 tablet 12    Prenatal 28-0.8 MG TABS Take 1 tablet by mouth daily. 30 tablet 12    promethazine (PHENERGAN) 25 MG tablet Take 1 tablet (25 mg total) by mouth every 6 (six) hours as needed for nausea or vomiting. 30 tablet 2    sertraline (ZOLOFT) 25 MG tablet Take 1 tablet (25 mg total) by mouth daily. (Patient not taking: Reported on 08/11/2023) 30 tablet 3    simethicone (MYLICON) 80 MG chewable tablet Chew 1 tablet (80 mg total) by mouth every 6 (six) hours as needed. 30 tablet 0     Review of Systems  Gastrointestinal:  Positive for abdominal pain. Negative for constipation, diarrhea, nausea and vomiting.  Genitourinary:  Negative for difficulty urinating, dysuria, vaginal bleeding and vaginal discharge.   Physical Exam   Blood pressure 135/78, pulse 85, temperature 98.5 F (36.9 C), temperature source Oral, resp. rate 17, height 5\' 5"  (1.651 m), weight 77.9 kg, SpO2 100%,  currently breastfeeding.  Physical Exam Vitals reviewed.  Constitutional:      Appearance: Normal appearance. She is well-developed.  HENT:     Head: Normocephalic and atraumatic.  Eyes:     Conjunctiva/sclera: Conjunctivae normal.  Cardiovascular:     Rate and Rhythm: Normal rate.  Pulmonary:     Effort: Pulmonary effort is normal. No respiratory distress.  Abdominal:     Tenderness: There is abdominal tenderness in the left upper quadrant.  Musculoskeletal:        General: Normal range of motion.     Cervical back: Normal range of motion.  Skin:    General: Skin is warm and dry.  Neurological:     Mental Status: She is alert and oriented to person, place, and time.  Psychiatric:        Mood and Affect: Mood normal.        Behavior: Behavior normal.     MAU Course  Procedures Results for orders placed or performed during the hospital encounter of 09/30/23 (from the past 24 hours)  Urinalysis,  Routine w reflex microscopic -Urine, Clean Catch     Status: Abnormal   Collection Time: 09/30/23  7:10 PM  Result Value Ref Range   Color, Urine YELLOW YELLOW   APPearance CLOUDY (A) CLEAR   Specific Gravity, Urine 1.013 1.005 - 1.030   pH 6.0 5.0 - 8.0   Glucose, UA NEGATIVE NEGATIVE mg/dL   Hgb urine dipstick NEGATIVE NEGATIVE   Bilirubin Urine NEGATIVE NEGATIVE   Ketones, ur NEGATIVE NEGATIVE mg/dL   Protein, ur NEGATIVE NEGATIVE mg/dL   Nitrite NEGATIVE NEGATIVE   Leukocytes,Ua MODERATE (A) NEGATIVE   RBC / HPF 0-5 0 - 5 RBC/hpf   WBC, UA 0-5 0 - 5 WBC/hpf   Bacteria, UA RARE (A) NONE SEEN   Squamous Epithelial / HPF 21-50 0 - 5 /HPF   Mucus PRESENT    Hyaline Casts, UA PRESENT    US OB Transvaginal Result Date: 09/30/2023 CLINICAL DATA:  Left-sided pelvic pain, ovarian cysts, pregnant EXAM: TRANSVAGINAL OB ULTRASOUND TECHNIQUE: Transvaginal ultrasound was performed for complete evaluation of the gestation as well as the maternal uterus, adnexal regions, and pelvic cul-de-sac. COMPARISON:  09/28/2023 FINDINGS: Intrauterine gestational sac: Single Yolk sac:  Visualized. Embryo:  Visualized. Cardiac Activity: Visualized. Heart Rate: 140 bpm CRL:   12.6 mm   7 w 4 d                  [place] EDC: 05/14/2024 Subchorionic hemorrhage:  None visualized. Maternal uterus/adnexae: Right ovary measures 2.9 x 1.5 x 1.3 cm and is unremarkable. The left ovary measures 7.1 x 4.8 x 5.0 cm, with a minimally complex 5.9 x 4.7 x 4.9 cm cyst identified. Thin internal septations and minimal internal echogenic debris identified on cine images. Normal color flow is identified within the bilateral ovaries on Doppler interrogation. There is trace pelvic free fluid, nonspecific. IMPRESSION: 1. Single live intrauterine pregnancy as above, estimated age 59 weeks and 4 days. 2. 5.9 cm left ovarian cyst, unchanged in size since prior study. Thin internal septations and minimal internal debris are now identified, this may  reflect a hemorrhagic cyst. Normal vascular flow is seen within the left ovary on color Doppler imaging. 3. Unremarkable right ovary. 4. Trace pelvic free fluid, nonspecific. Electronically Signed   By: Sharlet Salina M.D.   On: 09/30/2023 20:20    MDM Labs: UA Ultrasound Pain medication Muscle Relaxant Work Excuse Assessment and Plan  43 year old, [id number]  SIUP at 7.3 weeks Abdominal Pain Left Ovarian Cyst  -Ultrasound ordered by LLK, CNM while patient in triage. -Results as above. -Provider to bedside to discuss.  -Patient reassured that cysts remains stable. -Informed that will review prn or at anatomy US.  -Exam performed and findings discussed. -Patient offered and accepts pain medication. -Will give flexeril and tylenol. -Patient requesting discharge after medications and provider agreeable. -Patient also requesting and given work note.  -Precautions reviewed. -Encouraged to call primary office or return to MAU if symptoms worsen or with the onset of new symptoms. -Discharged to home in stable condition.  Cherre Robins MSN, CNM Advanced Practice Provider, [facility] 09/30/2023, 9:51 PM

## 2023-09-30 NOTE — MAU Note (Signed)
.  Brittany [name] is a 43 y.o. at [redacted]w[redacted]d here in MAU reporting:   Lower Left side pain not radiating just constantly there. Pt describes the pain as throbbing and if the patient is standing it gets worse.   1500 took tylenol but did not help.   Was here in MAU last week and was told she had a cyst left ovary.   No bleeding, no leaking of fluid.   Pain score: 10/10   UA ordered.   Vitals:   09/30/23 1911  BP: 135/78  Pulse: 85  Resp: 17  Temp: 98.5 F (36.9 C)  SpO2: 100%

## 2023-10-05 ENCOUNTER — Ambulatory Visit: Admitting: *Deleted

## 2023-10-05 ENCOUNTER — Other Ambulatory Visit: Payer: Self-pay

## 2023-10-05 VITALS — BP 156/83 | HR 80 | Wt 170.0 lb

## 2023-10-05 DIAGNOSIS — O0991 Supervision of high risk pregnancy, unspecified, first trimester: Secondary | ICD-10-CM

## 2023-10-05 DIAGNOSIS — O10919 Unspecified pre-existing hypertension complicating pregnancy, unspecified trimester: Secondary | ICD-10-CM | POA: Diagnosis not present

## 2023-10-05 DIAGNOSIS — O0990 Supervision of high risk pregnancy, unspecified, unspecified trimester: Secondary | ICD-10-CM

## 2023-10-05 DIAGNOSIS — Z3A08 8 weeks gestation of pregnancy: Secondary | ICD-10-CM

## 2023-10-05 DIAGNOSIS — O26893 Other specified pregnancy related conditions, third trimester: Secondary | ICD-10-CM | POA: Diagnosis not present

## 2023-10-05 DIAGNOSIS — Z0131 Encounter for examination of blood pressure with abnormal findings: Secondary | ICD-10-CM | POA: Diagnosis not present

## 2023-10-05 DIAGNOSIS — O09299 Supervision of pregnancy with other poor reproductive or obstetric history, unspecified trimester: Secondary | ICD-10-CM | POA: Diagnosis not present

## 2023-10-05 DIAGNOSIS — O24410 Gestational diabetes mellitus in pregnancy, diet controlled: Secondary | ICD-10-CM | POA: Diagnosis not present

## 2023-10-05 NOTE — Progress Notes (Signed)
 New OB Intake  I explained I am completing New OB Intake today. We discussed EDD of 05/15/2024, by Ultrasound. Pt is [id number]. I reviewed her allergies, medications and Medical/Surgical/OB history.    Patient Active Problem List   Diagnosis Date Noted   Supervision of high risk pregnancy, antepartum 10/05/2023   Hypertension 01/24/2023   Vitamin D deficiency 12/14/2017    Concerns addressed today  Patient informed that the ultrasound is considered a limited obstetric ultrasound and is not intended to be a complete ultrasound exam.  Patient also informed that the ultrasound is not being completed with the intent of assessing for fetal or placental anomalies or any pelvic abnormalities. Explained that the purpose of today's ultrasound is to assess for fetal heart rate.  Patient acknowledges the purpose of the exam and the limitations of the study.     Delivery Plans Plans to deliver at [facility]. Discussed the nature of our practice with multiple providers including residents and students. Due to the size of the practice, the delivering provider may not be the same as those providing prenatal care.   MyChart/Babyscripts MyChart access verified. I explained pt will have some visits in office and some virtually. Babyscripts app discussed and ordered.   Blood Pressure Cuff Blood pressure cuff discussed and given Discussed to be used for virtual visits and or if needed BP checks weekly.  Anatomy US Explained first scheduled [place] will be around 19 weeks.   Last Pap Diagnosis  Date Value Ref Range Status  02/10/2022   Final   - Negative for intraepithelial lesion or malignancy (NILM)    First visit review I reviewed new OB appt with patient. Explained pt will be seen by Dr Macon Large at first visit.Marland Kitchen Routine prenatal labs to be collected at [facility].    Scheryl Marten, RN 10/05/2023  1:43 PM

## 2023-10-09 ENCOUNTER — Inpatient Hospital Stay (HOSPITAL_COMMUNITY)
Admission: AD | Admit: 2023-10-09 | Discharge: 2023-10-09 | Disposition: A | Discharge status: Home / Self Care | Payer: PRIVATE HEALTH INSURANCE | Attending: Obstetrics & Gynecology | Admitting: Obstetrics & Gynecology

## 2023-10-09 ENCOUNTER — Encounter (HOSPITAL_COMMUNITY): Payer: Self-pay | Admitting: Obstetrics & Gynecology

## 2023-10-09 DIAGNOSIS — N83202 Unspecified ovarian cyst, left side: Secondary | ICD-10-CM | POA: Insufficient documentation

## 2023-10-09 DIAGNOSIS — O26891 Other specified pregnancy related conditions, first trimester: Secondary | ICD-10-CM | POA: Diagnosis present

## 2023-10-09 DIAGNOSIS — O3481 Maternal care for other abnormalities of pelvic organs, first trimester: Secondary | ICD-10-CM | POA: Insufficient documentation

## 2023-10-09 DIAGNOSIS — O219 Vomiting of pregnancy, unspecified: Secondary | ICD-10-CM | POA: Diagnosis not present

## 2023-10-09 DIAGNOSIS — B349 Viral infection, unspecified: Secondary | ICD-10-CM

## 2023-10-09 DIAGNOSIS — E860 Dehydration: Secondary | ICD-10-CM

## 2023-10-09 DIAGNOSIS — R102 Pelvic and perineal pain: Secondary | ICD-10-CM | POA: Insufficient documentation

## 2023-10-09 DIAGNOSIS — Z1152 Encounter for screening for COVID-19: Secondary | ICD-10-CM | POA: Diagnosis not present

## 2023-10-09 DIAGNOSIS — O98511 Other viral diseases complicating pregnancy, first trimester: Secondary | ICD-10-CM | POA: Insufficient documentation

## 2023-10-09 DIAGNOSIS — O3680X0 Pregnancy with inconclusive fetal viability, not applicable or unspecified: Secondary | ICD-10-CM | POA: Insufficient documentation

## 2023-10-09 DIAGNOSIS — O09521 Supervision of elderly multigravida, first trimester: Secondary | ICD-10-CM | POA: Insufficient documentation

## 2023-10-09 DIAGNOSIS — Z3A08 8 weeks gestation of pregnancy: Secondary | ICD-10-CM | POA: Diagnosis not present

## 2023-10-09 DIAGNOSIS — M549 Dorsalgia, unspecified: Secondary | ICD-10-CM | POA: Diagnosis not present

## 2023-10-09 DIAGNOSIS — O99281 Endocrine, nutritional and metabolic diseases complicating pregnancy, first trimester: Secondary | ICD-10-CM | POA: Diagnosis not present

## 2023-10-09 LAB — URINALYSIS, ROUTINE W REFLEX MICROSCOPIC
Bilirubin Urine: NEGATIVE
Glucose, UA: NEGATIVE mg/dL
Hgb urine dipstick: NEGATIVE
Ketones, ur: NEGATIVE mg/dL
Leukocytes,Ua: NEGATIVE
Nitrite: NEGATIVE
Protein, ur: NEGATIVE mg/dL
Specific Gravity, Urine: >1.03 — ABNORMAL HIGH (ref 1.005–1.030)
pH: 6 (ref 5.0–8.0)

## 2023-10-09 LAB — RESP PANEL BY RT-PCR (RSV, FLU A&B, COVID)  RVPGX2
Influenza A by PCR: NEGATIVE
Influenza B by PCR: NEGATIVE
Resp Syncytial Virus by PCR: NEGATIVE
SARS Coronavirus 2 by RT PCR: NEGATIVE

## 2023-10-09 MED ORDER — ONDANSETRON HCL 4 MG/2ML IJ SOLN
4.0000 mg | Freq: Once | INTRAMUSCULAR | Status: AC
  Administered 2023-10-09: 4 mg via INTRAVENOUS
  Filled 2023-10-09: qty 2

## 2023-10-09 MED ORDER — LACTATED RINGERS IV BOLUS
1000.0000 mL | Freq: Once | INTRAVENOUS | Status: AC
  Administered 2023-10-09: 1000 mL via INTRAVENOUS

## 2023-10-09 MED ORDER — ACETAMINOPHEN 500 MG PO TABS
1000.0000 mg | ORAL_TABLET | Freq: Once | ORAL | Status: AC
  Administered 2023-10-09: 1000 mg via ORAL
  Filled 2023-10-09: qty 2

## 2023-10-09 MED ORDER — ONDANSETRON 4 MG PO TBDP: 4.0000 mg | ORAL_TABLET | Freq: Three times a day (TID) | ORAL | 0 refills | Status: DC | PRN

## 2023-10-09 MED ORDER — ACETAMINOPHEN 500 MG PO TABS: 1000.0000 mg | ORAL_TABLET | Freq: Four times a day (QID) | ORAL | 2 refills | Status: AC | PRN

## 2023-10-09 MED ORDER — CYCLOBENZAPRINE HCL 5 MG PO TABS
10.0000 mg | ORAL_TABLET | Freq: Once | ORAL | Status: AC
  Administered 2023-10-09: 10 mg via ORAL
  Filled 2023-10-09: qty 2

## 2023-10-09 MED ORDER — METOCLOPRAMIDE HCL 5 MG/ML IJ SOLN
10.0000 mg | Freq: Once | INTRAMUSCULAR | Status: AC
  Administered 2023-10-09: 10 mg via INTRAVENOUS
  Filled 2023-10-09: qty 2

## 2023-10-09 MED ORDER — METOCLOPRAMIDE HCL 10 MG PO TABS: 10.0000 mg | ORAL_TABLET | Freq: Four times a day (QID) | ORAL | 0 refills | Status: DC | PRN

## 2023-10-09 NOTE — MAU Provider Note (Signed)
 Chief Complaint: Abdominal Pain, Back Pain, Nausea, Emesis, light headed, and Constipation   Event Date/Time   First Provider Initiated Contact with Patient 10/09/23 1348      SUBJECTIVE HPI: Brittany [name] is a 43 y.o. [id number] at [redacted]w[redacted]d by early ultrasound who presents to maternity admissions reporting nausea/vomiting, abdominal pain/back pain, lightheadedness onset today.  Patient states that she started to feel poorly this morning.  Feels lightheaded/dizzy when she stands up.  Overall body aches but particularly generalized to the abdomen and lower back.  She has been vomiting multiple times today.  Notes feeling intermittently hot/cold but has not taken her temperature at home.  She notes that one of her kids was sick last week.  Viral swab was negative but diagnosed with viral illness that was unspecified.  She denies vaginal bleeding, loss of fluid, change in discharge, urinary symptoms.  Does notably have a ovarian cyst on her left which was unchanged in size when last imaged x 2 (3/13, 3/15).  HPI  Past Medical History:  Diagnosis Date   Anemia 11/30/2016   Cervical dysplasia    Had LEEP   Gallstone    Heart murmur    Hypertension    Migraine 03/18/2016   Needs referral to headache specialist   Murmur, cardiac 11/30/2016   Cardiology evaluation: normal 12/2017   Severe preeclampsia    Subclinical hyperthyroidism 12/09/2015   UTI (urinary tract infection)    Past Surgical History:  Procedure Laterality Date   DILATION AND CURETTAGE OF UTERUS     LEEP     Social History   Socioeconomic History   Marital status: Legally Separated    Spouse name: Not on file   Number of children: Not on file   Years of education: Not on file   Highest education level: Not on file  Occupational History   Not on file  Tobacco Use   Smoking status: Never   Smokeless tobacco: Never  Vaping Use   Vaping status: Never Used  Substance and Sexual Activity   Alcohol use: No     Alcohol/week: 0.0 standard drinks of alcohol   Drug use: No   Sexual activity: Yes  Other Topics Concern   Not on file  Social History Narrative   Not on file   Social Drivers of Health   Financial Resource Strain: Not on file  Food Insecurity: No Food Insecurity (12/13/2022)   Hunger Vital Sign    Worried About Running Out of Food in the Last Year: Never true    Ran Out of Food in the Last Year: Never true  Transportation Needs: No Transportation Needs (12/13/2022)   PRAPARE - Administrator, Civil Service (Medical): No    Lack of Transportation (Non-Medical): No  Physical Activity: Not on file  Stress: Not on file  Social Connections: Not on file  Intimate Partner Violence: Not At Risk (12/13/2022)   Humiliation, Afraid, Rape, and Kick questionnaire    Fear of Current or Ex-Partner: No    Emotionally Abused: No    Physically Abused: No    Sexually Abused: No   No current facility-administered medications on file prior to encounter.   Current Outpatient Medications on File Prior to Encounter  Medication Sig Dispense Refill   NIFEdipine (ADALAT CC) 30 MG 24 hr tablet Take 1 tablet (30 mg total) by mouth 2 (two) times daily. 60 tablet 12   Prenatal 28-0.8 MG TABS Take 1 tablet by mouth daily. 30 tablet 12  cyclobenzaprine (FLEXERIL) 10 MG tablet Take 1 tablet (10 mg total) by mouth 3 (three) times daily as needed for muscle spasms. 20 tablet 0   promethazine (PHENERGAN) 25 MG tablet Take 1 tablet (25 mg total) by mouth every 6 (six) hours as needed for nausea or vomiting. 30 tablet 2   simethicone (MYLICON) 80 MG chewable tablet Chew 1 tablet (80 mg total) by mouth every 6 (six) hours as needed. 30 tablet 0   [DISCONTINUED] cetirizine (ZYRTEC ALLERGY) 10 MG tablet Take 1 tablet (10 mg total) by mouth daily. 30 tablet 2   [DISCONTINUED] ipratropium (ATROVENT) 0.06 % nasal spray Place 2 sprays into both nostrils 4 (four) times daily. 15 mL 0   No Known Allergies  ROS:   Pertinent positives/negatives listed above.  I have reviewed patient's Past Medical Hx, Surgical Hx, Family Hx, Social Hx, medications and allergies.   Physical Exam  Patient Vitals for the past 24 hrs:  BP Temp Pulse Resp SpO2 Height Weight  10/09/23 1345 117/66 -- 78 -- -- -- --  10/09/23 1333 116/83 99.4 F (37.4 C) 89 18 100 % 5\' 5"  (1.651 m) 77.2 kg   Constitutional: Well-developed, well-nourished female, appears mildly ill.  No acute distress Cardiovascular: normal rate, no murmurs rubs gallops Respiratory: normal effort, clear to auscultation bilaterally GI: Abd soft, non-tender. Pos BS x 4 MS: Extremities nontender, no edema, normal ROM Neurologic: Alert and oriented x 4 GU: Neg CVAT  LAB RESULTS Results for orders placed or performed during the hospital encounter of 10/09/23 (from the past 24 hours)  Urinalysis, Routine w reflex microscopic -Urine, Clean Catch     Status: Abnormal   Collection Time: 10/09/23  1:53 PM  Result Value Ref Range   Color, Urine YELLOW YELLOW   APPearance CLEAR CLEAR   Specific Gravity, Urine >1.030 (H) 1.005 - 1.030   pH 6.0 5.0 - 8.0   Glucose, UA NEGATIVE NEGATIVE mg/dL   Hgb urine dipstick NEGATIVE NEGATIVE   Bilirubin Urine NEGATIVE NEGATIVE   Ketones, ur NEGATIVE NEGATIVE mg/dL   Protein, ur NEGATIVE NEGATIVE mg/dL   Nitrite NEGATIVE NEGATIVE   Leukocytes,Ua NEGATIVE NEGATIVE  Resp panel by RT-PCR (RSV, Flu A&B, Covid) Anterior Nasal Swab     Status: None   Collection Time: 10/09/23  2:21 PM   Specimen: Anterior Nasal Swab  Result Value Ref Range   SARS Coronavirus 2 by RT PCR NEGATIVE NEGATIVE   Influenza A by PCR NEGATIVE NEGATIVE   Influenza B by PCR NEGATIVE NEGATIVE   Resp Syncytial Virus by PCR NEGATIVE NEGATIVE    --/--/O POS (05/28 0000)  IMAGING US OB Transvaginal Result Date: 09/30/2023 CLINICAL DATA:  Left-sided pelvic pain, ovarian cysts, pregnant EXAM: TRANSVAGINAL OB ULTRASOUND TECHNIQUE: Transvaginal  ultrasound was performed for complete evaluation of the gestation as well as the maternal uterus, adnexal regions, and pelvic cul-de-sac. COMPARISON:  09/28/2023 FINDINGS: Intrauterine gestational sac: Single Yolk sac:  Visualized. Embryo:  Visualized. Cardiac Activity: Visualized. Heart Rate: 140 bpm CRL:   12.6 mm   7 w 4 d                  [place] EDC: 05/14/2024 Subchorionic hemorrhage:  None visualized. Maternal uterus/adnexae: Right ovary measures 2.9 x 1.5 x 1.3 cm and is unremarkable. The left ovary measures 7.1 x 4.8 x 5.0 cm, with a minimally complex 5.9 x 4.7 x 4.9 cm cyst identified. Thin internal septations and minimal internal echogenic debris identified on cine images. Normal  color flow is identified within the bilateral ovaries on Doppler interrogation. There is trace pelvic free fluid, nonspecific. IMPRESSION: 1. Single live intrauterine pregnancy as above, estimated age 64 weeks and 4 days. 2. 5.9 cm left ovarian cyst, unchanged in size since prior study. Thin internal septations and minimal internal debris are now identified, this may reflect a hemorrhagic cyst. Normal vascular flow is seen within the left ovary on color Doppler imaging. 3. Unremarkable right ovary. 4. Trace pelvic free fluid, nonspecific. Electronically Signed   By: Sharlet Salina M.D.   On: 09/30/2023 20:20   US OB Transvaginal Result Date: 09/28/2023 CLINICAL DATA:  [id number] LLQ pain 149326 EXAM: OBSTETRIC <14 WK [place] AND TRANSVAGINAL OB US TECHNIQUE: Both transabdominal and transvaginal ultrasound examinations were performed for complete evaluation of the gestation as well as the maternal uterus, adnexal regions, and pelvic cul-de-sac. Transvaginal technique was performed to assess early pregnancy. COMPARISON:  CT renal 09/04/2022. ultrasound Ob 09/21/2023 FINDINGS: Intrauterine gestational sac: Single Yolk sac:  Visualized. Embryo:  Visualized. Cardiac Activity: Visualized. Heart Rate: 135 bpm CRL:  10.1 mm   7 w   1 d                   [place] EDC: 05/15/2024 Subchorionic hemorrhage:  None visualized. Maternal uterus/adnexae: There is a simple left ovarian cyst measuring 5.6 x 4.7 x 5.8 cm. Otherwise bilateral ovaries unremarkable. IMPRESSION: 1. Single live intrauterine pregnancy with gestational age by ultrasound of 7 weeks and 1 day which is concordant with assigned gestational age of [redacted] weeks and 6 days. 2. A 5.8 cm simple left ovarian cyst. Electronically Signed   By: Tish Frederickson M.D.   On: 09/28/2023 01:31   US OB LESS THAN 14 WEEKS WITH OB TRANSVAGINAL Result Date: 09/25/2023 ----------------------------------------------------------------------  OBSTETRICS REPORT                       (Signed Final 09/25/2023 02:23 pm) ---------------------------------------------------------------------- Patient Info  ID #:       [id number]                          D.O.B.:  07-19-1980 (42 yrs)(F)  Name:       Sherri Sear Pernice                Visit Date: 09/21/2023 04:28 pm ---------------------------------------------------------------------- Performed By  Attending:        Lyndel Safe        Ref. Address:     [address]                    [place]                                                             [place], [place]                                                             [address]  Performed By:     Berline Chough  [facility]                                                             [facility]                                                             [facility]                                                             [facility]  Referred By:      [facility] ---------------------------------------------------------------------- Orders  #  Description                           Code        Ordered By  1  US OB LESS THAN 14 WEEKS              [id number]     CHARLIE PICKENS     WITH OB TRANSVAGINAL  ----------------------------------------------------------------------  #  Order #                     Accession #                Episode #  1  [id number]                   [id number]                 [id number] ---------------------------------------------------------------------- Indications  Weeks of gestation of pregnancy not            Z3A.00  specified  Pregnancy with inconclusive fetal viability    O36.80X0  Encounter for uncertain dates                  Z36.87 ---------------------------------------------------------------------- Fetal Evaluation  Num Of Fetuses:         1  Preg. Location:         Intrauterine  Gest. Sac:              Intrauterine  Yolk Sac:               Visualized  Fetal Pole:             Visualized  Fetal Heart Rate(bpm):  103  Cardiac Activity:       Observed ----------------------------------------------------------------------  1st Trimester Genetic Sonogram Screening  CRL:             2.8  mm    G. Age:   5w 6d                  EDD:   05/17/24 ---------------------------------------------------------------------- Impression  Early IUP  Slightly lower fetal heart rate ---------------------------------------------------------------------- Recommendations  Repeat US in 14 days to confirm normal pregnancy  progression given slightly lower FHR for this scan. Ordered at  [facility] office. RN at [facility] to schedule and coordinate with patient. ----------------------------------------------------------------------               Lyndel Safe, MD Electronically Signed Final Report   09/25/2023 02:23 pm ----------------------------------------------------------------------    MAU Management/MDM: Orders Placed This Encounter  Procedures   Resp panel by RT-PCR (RSV, Flu A&B, Covid) Anterior Nasal Swab   Urinalysis, Routine w reflex microscopic -Urine, Clean Catch   Discharge patient    Meds ordered this encounter  Medications   lactated ringers bolus 1,000 mL   acetaminophen (TYLENOL) tablet 1,000  mg   ondansetron (ZOFRAN) injection 4 mg   cyclobenzaprine (FLEXERIL) tablet 10 mg   metoCLOPramide (REGLAN) injection 10 mg   ondansetron (ZOFRAN-ODT) 4 MG disintegrating tablet    Sig: Take 1 tablet (4 mg total) by mouth every 8 (eight) hours as needed.    Dispense:  30 tablet    Refill:  0   metoCLOPramide (REGLAN) 10 MG tablet    Sig: Take 1 tablet (10 mg total) by mouth every 6 (six) hours as needed for refractory nausea / vomiting.    Dispense:  30 tablet    Refill:  0   acetaminophen (TYLENOL) 500 MG tablet    Sig: Take 2 tablets (1,000 mg total) by mouth every 6 (six) hours as needed.    Dispense:  100 tablet    Refill:  2    Patient presents with acute onset of generalized malaise, dizziness, body aches this morning at 8 weeks 5 days gestation.  Do suspect that she has a viral illness given close contact with same last week and symptoms.  Less likely hyperemesis.  She has a benign abdominal exam and known IUP, so I do not suspect an intra-abdominal/ectopic process.  I also doubt that her known left ovarian cyst which has been unchanged in size is causing all of her symptoms.  Will obtain flu/COVID swab.  Will additionally treat symptoms with LR, Zofran, Tylenol, Flexeril.  UA consistent with dehydration with spec grav >1.030. Flu/COVID negative.  1528: Patient no longer vomiting, however still feeling nausea per RN report.  Will give reglan as well.  1629: Patient feeling improved upon my re-evaluation. Discussed being discharged home with zofran, reglan, tylenol, and supportive measures. She is amenable to this.  ASSESSMENT 1. Acute viral syndrome   2. Dehydration   3. Nausea and vomiting during pregnancy   4. [redacted] weeks gestation of pregnancy     PLAN Discharge home with strict return precautions. Allergies as of 10/09/2023   No Known Allergies      Medication List     TAKE these medications    acetaminophen 500 MG tablet Commonly known as: TYLENOL Take 2 tablets  (1,000 mg total) by mouth every 6 (six) hours as needed.   cyclobenzaprine 10 MG tablet Commonly known as: FLEXERIL Take 1 tablet (10 mg total) by mouth 3 (three) times daily as needed for muscle spasms.   metoCLOPramide 10 MG  tablet Commonly known as: Reglan Take 1 tablet (10 mg total) by mouth every 6 (six) hours as needed for refractory nausea / vomiting.   NIFEdipine 30 MG 24 hr tablet Commonly known as: ADALAT CC Take 1 tablet (30 mg total) by mouth 2 (two) times daily.   ondansetron 4 MG disintegrating tablet Commonly known as: ZOFRAN-ODT Take 1 tablet (4 mg total) by mouth every 8 (eight) hours as needed.   Prenatal 28-0.8 MG Tabs Take 1 tablet by mouth daily.   promethazine 25 MG tablet Commonly known as: PHENERGAN Take 1 tablet (25 mg total) by mouth every 6 (six) hours as needed for nausea or vomiting.   simethicone 80 MG chewable tablet Commonly known as: MYLICON Chew 1 tablet (80 mg total) by mouth every 6 (six) hours as needed.         Wylene Simmer, MD OB Fellow 10/09/2023  4:46 PM

## 2023-10-09 NOTE — MAU Note (Signed)
 Brittany [name] is a 43 y.o. at [redacted]w[redacted]d here in MAU reporting: been lightheaded, feeling like she is going to pass out.  Been nauseated and been vomiting.  Feels like she is dehydrated. Been cramping in lower abd and having back pain.  Been hot and cold, usually just stay hot.  No bleeding. Has not checked temp. Has been constipated. Onset of complaint: this morning Pain score: abd8. Back 8 Vitals:   10/09/23 1333  BP: 116/83  Pulse: 89  Resp: 18  Temp: 99.4 F (37.4 C)  SpO2: 100%      Lab orders placed from triage:  urine

## 2023-10-17 ENCOUNTER — Encounter: Payer: Self-pay | Admitting: Obstetrics & Gynecology

## 2023-10-26 ENCOUNTER — Encounter: Payer: Self-pay | Admitting: *Deleted

## 2023-11-05 ENCOUNTER — Encounter: Payer: Self-pay | Admitting: Obstetrics & Gynecology

## 2023-11-08 ENCOUNTER — Inpatient Hospital Stay (HOSPITAL_COMMUNITY)
Admission: AD | Admit: 2023-11-08 | Discharge: 2023-11-08 | Disposition: A | Discharge status: Home / Self Care | Payer: PRIVATE HEALTH INSURANCE | Attending: Obstetrics and Gynecology | Admitting: Obstetrics and Gynecology

## 2023-11-08 ENCOUNTER — Encounter (HOSPITAL_COMMUNITY): Payer: Self-pay | Admitting: Obstetrics and Gynecology

## 2023-11-08 ENCOUNTER — Inpatient Hospital Stay (HOSPITAL_COMMUNITY): Payer: PRIVATE HEALTH INSURANCE

## 2023-11-08 DIAGNOSIS — O3481 Maternal care for other abnormalities of pelvic organs, first trimester: Secondary | ICD-10-CM | POA: Diagnosis not present

## 2023-11-08 DIAGNOSIS — R519 Headache, unspecified: Secondary | ICD-10-CM | POA: Diagnosis present

## 2023-11-08 DIAGNOSIS — R102 Pelvic and perineal pain: Secondary | ICD-10-CM | POA: Insufficient documentation

## 2023-11-08 DIAGNOSIS — Z3A13 13 weeks gestation of pregnancy: Secondary | ICD-10-CM | POA: Insufficient documentation

## 2023-11-08 DIAGNOSIS — N83202 Unspecified ovarian cyst, left side: Secondary | ICD-10-CM | POA: Insufficient documentation

## 2023-11-08 DIAGNOSIS — O3480 Maternal care for other abnormalities of pelvic organs, unspecified trimester: Secondary | ICD-10-CM

## 2023-11-08 DIAGNOSIS — R109 Unspecified abdominal pain: Secondary | ICD-10-CM | POA: Diagnosis present

## 2023-11-08 DIAGNOSIS — O26891 Other specified pregnancy related conditions, first trimester: Secondary | ICD-10-CM | POA: Insufficient documentation

## 2023-11-08 DIAGNOSIS — O26892 Other specified pregnancy related conditions, second trimester: Secondary | ICD-10-CM

## 2023-11-08 DIAGNOSIS — M549 Dorsalgia, unspecified: Secondary | ICD-10-CM | POA: Diagnosis present

## 2023-11-08 LAB — URINALYSIS, ROUTINE W REFLEX MICROSCOPIC
Bilirubin Urine: NEGATIVE
Glucose, UA: NEGATIVE mg/dL
Hgb urine dipstick: NEGATIVE
Ketones, ur: NEGATIVE mg/dL
Leukocytes,Ua: NEGATIVE
Nitrite: NEGATIVE
Protein, ur: NEGATIVE mg/dL
Specific Gravity, Urine: 1.02 (ref 1.005–1.030)
pH: 7 (ref 5.0–8.0)

## 2023-11-08 MED ORDER — CYCLOBENZAPRINE HCL 5 MG PO TABS
10.0000 mg | ORAL_TABLET | Freq: Once | ORAL | Status: AC
  Administered 2023-11-08: 10 mg via ORAL
  Filled 2023-11-08: qty 2

## 2023-11-08 MED ORDER — CYCLOBENZAPRINE HCL 10 MG PO TABS: 10.0000 mg | ORAL_TABLET | Freq: Two times a day (BID) | ORAL | 0 refills | Status: DC | PRN

## 2023-11-08 MED ORDER — ACETAMINOPHEN 500 MG PO TABS
1000.0000 mg | ORAL_TABLET | Freq: Once | ORAL | Status: AC
  Administered 2023-11-08: 1000 mg via ORAL
  Filled 2023-11-08: qty 2

## 2023-11-08 NOTE — MAU Note (Signed)
 MAU Triage Note:  .Brittany [name] is a 43 y.o. at 106w0d here in MAU reporting: severe back and abdominal pain. Pain started yesterday, has taken tylenol  without relief. Has a known cyst on the left side. Also reports headache and nausea. No emesis today, just feeling nauseated. Denies VB or LOF.  Pain Score: 10-Worst pain ever Pain Location: Back Pain Score: 8 Pain Location: Abdomen   Onset of complaint: Yesterday LMP: No LMP recorded. Patient is pregnant.  Vitals:   11/08/23 1039  BP: (!) 144/80  Pulse: 81  Resp: 16  Temp: 98.4 F (36.9 C)  SpO2: 100%    FHT:  Fetal Heart Rate Mode: Doppler Baseline Rate (A): 159 bpm Multiple birth?: No Lab orders placed from triage: UA

## 2023-11-08 NOTE — MAU Provider Note (Signed)
 History     CSN: [id number]  Arrival date and time: 11/08/23 1007   Event Date/Time   First Provider Initiated Contact with Patient 11/08/23 1521      Chief Complaint  Patient presents with   Abdominal Pain   Back Pain   Nausea   Headache   HPI Brittany [name] is a 43 y.o. year old [id number] female at [redacted]w[redacted]d weeks gestation who presents to MAU reporting severe back (rated 10/10)  and abdominal pain (rated 8/10). She reported the pain started yesterday; Tylenol  did not relieve. She has a known LT ovarian cyst. She reports H/A & nausea without vomiting. She denies VB or LOF. She receives [facility] with [facility]; next appt is 11/09/2023.    OB History     Gravida  11   Para  5   Term  2   Preterm  3   AB  5   Living  5      SAB  4   IAB  1   Ectopic  0   Multiple  0   Live Births  5           Past Medical History:  Diagnosis Date   Anemia 11/30/2016   Cervical dysplasia    Had LEEP   Gallstone    Heart murmur    Hypertension    Migraine 03/18/2016   Needs referral to headache specialist   Murmur, cardiac 11/30/2016   Cardiology evaluation: normal 12/2017   Severe preeclampsia    Subclinical hyperthyroidism 12/09/2015   UTI (urinary tract infection)     Past Surgical History:  Procedure Laterality Date   DILATION AND CURETTAGE OF UTERUS     LEEP      Family History  Problem Relation Age of Onset   Asthma Mother    Hypertension Mother    CAD Mother    Asthma Maternal Grandmother    Hypertension Maternal Grandmother     Social History   Tobacco Use   Smoking status: Never   Smokeless tobacco: Never  Vaping Use   Vaping status: Never Used  Substance Use Topics   Alcohol use: No    Alcohol/week: 0.0 standard drinks of alcohol   Drug use: No    Allergies: No Known Allergies  Medications Prior to Admission  Medication Sig Dispense Refill Last Dose/Taking   acetaminophen  (TYLENOL ) 500 MG tablet Take 2 tablets (1,000 mg  total) by mouth every 6 (six) hours as needed. 100 tablet 2    cyclobenzaprine  (FLEXERIL ) 10 MG tablet Take 1 tablet (10 mg total) by mouth 3 (three) times daily as needed for muscle spasms. 20 tablet 0    metoCLOPramide  (REGLAN ) 10 MG tablet Take 1 tablet (10 mg total) by mouth every 6 (six) hours as needed for refractory nausea / vomiting. 30 tablet 0    NIFEdipine  (ADALAT  CC) 30 MG 24 hr tablet Take 1 tablet (30 mg total) by mouth 2 (two) times daily. 60 tablet 12    ondansetron  (ZOFRAN -ODT) 4 MG disintegrating tablet Take 1 tablet (4 mg total) by mouth every 8 (eight) hours as needed. 30 tablet 0    Prenatal 28-0.8 MG TABS Take 1 tablet by mouth daily. 30 tablet 12    promethazine  (PHENERGAN ) 25 MG tablet Take 1 tablet (25 mg total) by mouth every 6 (six) hours as needed for nausea or vomiting. 30 tablet 2    simethicone  (MYLICON) 80 MG chewable tablet Chew 1 tablet (80 mg total)  by mouth every 6 (six) hours as needed. 30 tablet 0     Review of Systems  Constitutional: Negative.   HENT: Negative.    Eyes: Negative.   Respiratory: Negative.    Cardiovascular: Negative.   Gastrointestinal: Negative.   Endocrine: Negative.   Genitourinary:  Positive for pelvic pain (LT lower pelvis, "where ovaarian cyst is").  Musculoskeletal: Negative.   Skin: Negative.   Allergic/Immunologic: Negative.   Neurological: Negative.   Hematological: Negative.   Psychiatric/Behavioral: Negative.     Physical Exam   Blood pressure (!) 144/80, pulse 81, temperature 98.4 F (36.9 C), temperature source Oral, resp. rate 16, height 5\' 5"  (1.651 m), weight 77.2 kg, SpO2 100%, currently breastfeeding.  Physical Exam Vitals and nursing note reviewed.  Constitutional:      Appearance: Normal appearance. She is normal weight.  Cardiovascular:     Rate and Rhythm: Normal rate.     Pulses: Normal pulses.  Pulmonary:     Effort: Pulmonary effort is normal.  Musculoskeletal:        General: Normal range of  motion.  Skin:    General: Skin is warm and dry.  Neurological:     Mental Status: She is alert and oriented to person, place, and time.  Psychiatric:        Mood and Affect: Mood normal.        Behavior: Behavior normal.        Thought Content: Thought content normal.        Judgment: Judgment normal.   Reassessment @ 1523: Patient reports pain is the same; 10/10. States she was sent here for a repeat U/S to re-evaluate LT ovarian cyst. Reassessment @ 1636: Pain is improving; rated 8/10.  MAU Course  Procedures  MDM CCUA Flexeril  10 mg po -- improving pain Tylenol  1000 mg po -- improving pain  Results for orders placed or performed during the hospital encounter of 11/08/23 (from the past 24 hours)  Urinalysis, Routine w reflex microscopic -Urine, Clean Catch     Status: Abnormal   Collection Time: 11/08/23 10:49 AM  Result Value Ref Range   Color, Urine YELLOW YELLOW   APPearance CLOUDY (A) CLEAR   Specific Gravity, Urine 1.020 1.005 - 1.030   pH 7.0 5.0 - 8.0   Glucose, UA NEGATIVE NEGATIVE mg/dL   Hgb urine dipstick NEGATIVE NEGATIVE   Bilirubin Urine NEGATIVE NEGATIVE   Ketones, ur NEGATIVE NEGATIVE mg/dL   Protein, ur NEGATIVE NEGATIVE mg/dL   Nitrite NEGATIVE NEGATIVE   Leukocytes,Ua NEGATIVE NEGATIVE    US  OB Comp Less 14 Wks Result Date: 11/08/2023 CLINICAL DATA:  Abdominal pain during pregnancy EXAM: OBSTETRIC <14 WK ULTRASOUND TECHNIQUE: Transabdominal ultrasound was performed for evaluation of the gestation as well as the maternal uterus and adnexal regions. COMPARISON:  September 30, 2023 FINDINGS: Intrauterine gestational sac: Seen Yolk sac:  No Embryo:  Yes Cardiac Activity: Yes Heart Rate: 151 bpm MSD:    mm    w     d CRL:   68.8 mm mm   13 w 1 D d                  US  EDC: 05/14/2024 Subchorionic hemorrhage:  None visualized. Maternal uterus/adnexae: Left adnexal ovarian cyst of 1.3 x 0.7 cm smaller than on prior. IMPRESSION: Single viable intrauterine pregnancy of  13 weeks 1 day as described. Resolving left adnexal cysts. Electronically Signed   By: Fredrich Jefferson M.D.   On: 11/08/2023  16:21    Assessment and Plan  1. Pelvic pain affecting pregnancy in second trimester, antepartum (Primary) - Information provided on pelvic pain in a female  2. Cyst of left ovary during pregnancy - Information provided on ovarian cyst   3. [redacted] weeks gestation of pregnancy   - Discharge home - Keep scheduled appt with [facility]- on 11/09/2023 - Patient verbalized an understanding of the plan of care and agrees.   Almond Army, CNM 11/08/2023, 3:21 PM

## 2023-11-09 ENCOUNTER — Other Ambulatory Visit (HOSPITAL_COMMUNITY)
Admission: RE | Admit: 2023-11-09 | Discharge: 2023-11-09 | Disposition: A | Discharge status: Home / Self Care | Payer: PRIVATE HEALTH INSURANCE | Source: Ambulatory Visit | Attending: Obstetrics & Gynecology | Admitting: Obstetrics & Gynecology

## 2023-11-09 ENCOUNTER — Encounter: Payer: Self-pay | Admitting: Obstetrics & Gynecology

## 2023-11-09 ENCOUNTER — Ambulatory Visit: Admitting: Obstetrics & Gynecology

## 2023-11-09 VITALS — BP 135/83 | HR 87

## 2023-11-09 DIAGNOSIS — Z1339 Encounter for screening examination for other mental health and behavioral disorders: Secondary | ICD-10-CM

## 2023-11-09 DIAGNOSIS — Z3A13 13 weeks gestation of pregnancy: Secondary | ICD-10-CM

## 2023-11-09 DIAGNOSIS — O10911 Unspecified pre-existing hypertension complicating pregnancy, first trimester: Secondary | ICD-10-CM | POA: Diagnosis not present

## 2023-11-09 DIAGNOSIS — O09299 Supervision of pregnancy with other poor reproductive or obstetric history, unspecified trimester: Secondary | ICD-10-CM

## 2023-11-09 DIAGNOSIS — O09291 Supervision of pregnancy with other poor reproductive or obstetric history, first trimester: Secondary | ICD-10-CM

## 2023-11-09 DIAGNOSIS — O0990 Supervision of high risk pregnancy, unspecified, unspecified trimester: Secondary | ICD-10-CM

## 2023-11-09 DIAGNOSIS — O09521 Supervision of elderly multigravida, first trimester: Secondary | ICD-10-CM | POA: Diagnosis present

## 2023-11-09 DIAGNOSIS — O10919 Unspecified pre-existing hypertension complicating pregnancy, unspecified trimester: Secondary | ICD-10-CM

## 2023-11-09 MED ORDER — VITAFOL GUMMIES 3.33-0.333-34.8 MG PO CHEW: 1.0000 | CHEWABLE_TABLET | Freq: Every day | ORAL | 5 refills | Status: AC

## 2023-11-09 MED ORDER — LABETALOL HCL 200 MG PO TABS
200.0000 mg | ORAL_TABLET | Freq: Two times a day (BID) | ORAL | 3 refills | Status: DC
Start: 2023-11-09 — End: 2024-02-01

## 2023-11-09 MED ORDER — ASPIRIN 81 MG PO TBEC: 162.0000 mg | DELAYED_RELEASE_TABLET | Freq: Every day | ORAL | 2 refills | Status: DC

## 2023-11-09 NOTE — Progress Notes (Signed)
 INITIAL PRENATAL VISIT  History:   Brittany [name] is a 43 y.o. [id number] at [redacted]w[redacted]d by early ultrasound being seen today for her first obstetrical visit.  Her obstetrical history is significant for  [facility]; was induced preterm for superimposed severe preeclampsia during her 2019 and 2024 pregnancies . Also had preeclampsia without severe features with her 2012 and 2017 pregnancies.  Patient does intend to breast feed. Pregnancy history fully reviewed.  Patient reports not liking side effects of Nifedipine , wants another medication. Nifedipine  makes her feel dizzy and weird.  Here with FOB.     HISTORY: OB History  Gravida Para Term Preterm AB Living  11 5 2 3 5 5   SAB IAB Ectopic Multiple Live Births  4 1 0 0 5    # Outcome Date GA Lbr Len/2nd Weight Sex Type Anes PTL Lv  11 Current           10 Preterm 12/13/22 [redacted]w[redacted]d  4 lb 8 oz (2.041 kg) F Vag-Spont EPI  LIV     Birth Comments: Induced for severe preeclampsia     Name: Schamp,GIRL Janique     Apgar1: 9  Apgar5: 9  9 Preterm 06/13/18 [redacted]w[redacted]d 07:04 / 00:15 5 lb 1.1 oz (2.3 kg) F Vag-Spont EPI  LIV     Name: Smallman,GIRL Latoi     Apgar1: 8  Apgar5: 9  8 Term 05/03/16 [redacted]w[redacted]d 00:08 / 00:01 4 lb 12.7 oz (2.175 kg) M Vag-Spont None  LIV     Name: Seidman,BOY Kismet     Apgar1: 8  Apgar5: 9  7 Preterm 04/06/11 [redacted]w[redacted]d 08:25 / 00:08 5 lb 6.6 oz (2.455 kg) F Vag-Spont None  LIV     Complications: PIH (pregnancy induced hypertension)     Name: Bertie Broaden     Apgar1: 9  Apgar5: 9  6 Term 10/21/05 [redacted]w[redacted]d    Vag-Spont   LIV     Complications: Failure to Progress in Second Stage  5 IAB           4 SAB           3 SAB           2 SAB           1 SAB             Last pap smear was done 02/10/2022 and was normal  Past Medical History:  Diagnosis Date   Anemia 11/30/2016   Cervical dysplasia    Had LEEP   Gallstone    Heart murmur    Hypertension    Migraine 03/18/2016   Needs referral to headache specialist   Murmur, cardiac 11/30/2016    Cardiology evaluation: normal 12/2017   Severe preeclampsia    Subclinical hyperthyroidism 12/09/2015   UTI (urinary tract infection)    Past Surgical History:  Procedure Laterality Date   DILATION AND CURETTAGE OF UTERUS     LEEP     Family History  Problem Relation Age of Onset   Asthma Mother    Hypertension Mother    CAD Mother    Asthma Maternal Grandmother    Hypertension Maternal Grandmother    Social History   Tobacco Use   Smoking status: Never   Smokeless tobacco: Never  Vaping Use   Vaping status: Never Used  Substance Use Topics   Alcohol use: No    Alcohol/week: 0.0 standard drinks of alcohol   Drug use: No   No Known Allergies Current  Outpatient Medications on File Prior to Visit  Medication Sig Dispense Refill   acetaminophen  (TYLENOL ) 500 MG tablet Take 2 tablets (1,000 mg total) by mouth every 6 (six) hours as needed. 100 tablet 2   cyclobenzaprine  (FLEXERIL ) 10 MG tablet Take 1 tablet (10 mg total) by mouth 2 (two) times daily as needed. 20 tablet 0   metoCLOPramide  (REGLAN ) 10 MG tablet Take 1 tablet (10 mg total) by mouth every 6 (six) hours as needed for refractory nausea / vomiting. 30 tablet 0   ondansetron  (ZOFRAN -ODT) 4 MG disintegrating tablet Take 1 tablet (4 mg total) by mouth every 8 (eight) hours as needed. 30 tablet 0   promethazine  (PHENERGAN ) 25 MG tablet Take 1 tablet (25 mg total) by mouth every 6 (six) hours as needed for nausea or vomiting. 30 tablet 2   simethicone  (MYLICON) 80 MG chewable tablet Chew 1 tablet (80 mg total) by mouth every 6 (six) hours as needed. 30 tablet 0   [DISCONTINUED] cetirizine  (ZYRTEC  ALLERGY) 10 MG tablet Take 1 tablet (10 mg total) by mouth daily. 30 tablet 2   [DISCONTINUED] ipratropium (ATROVENT ) 0.06 % nasal spray Place 2 sprays into both nostrils 4 (four) times daily. 15 mL 0   No current facility-administered medications on file prior to visit.    Review of Systems Pertinent items noted in HPI and  remainder of comprehensive ROS otherwise negative.  Physical Exam:   Vitals:   11/09/23 1500  BP: 135/83  Pulse: 87   Fetal Heart Rate (bpm): 148  General: well-developed, well-nourished female in no acute distress  Breasts:  deferred  Skin: normal coloration and turgor, no rashes  Neurologic: oriented, normal, negative, normal mood  Extremities: normal strength, tone, and muscle mass, ROM of all joints is normal  HEENT PERRLA, extraocular movement intact and sclera clear, anicteric  Neck supple and no masses  Cardiovascular: regular rate and rhythm  Respiratory:  no respiratory distress, normal breath sounds  Abdomen: soft, non-tender; no masses,  no organomegaly  Pelvic: deferred     Assessment:    Pregnancy: [id number] Patient Active Problem List   Diagnosis Date Noted   Supervision of high risk pregnancy, antepartum 10/05/2023   Hypertension 01/24/2023   AMA (advanced maternal age) multigravida 40+ 08/02/2022   History of severe preeclampsia in prior pregnancy 08/01/2022   Vitamin D  deficiency 12/14/2017   Preexisting hypertension complicating pregnancy, antepartum 04/09/2011     Plan:    1. Preexisting hypertension complicating pregnancy, antepartum (Primary) 2. History of severe preeclampsia in prior pregnancy Switched to Labetalol  in lieu of Nifedipine .  Ordered baseline labs. Discussed implications of CHTN in pregnancy, need for antenatal testing and frequent ultrasounds/prenatal visits, need for optimizing BP control to decrease CHTN/preeclampsia associated maternal-fetal morbidity and mortality.  - US  MFM OB DETAIL +14 WK; Future - Comprehensive metabolic panel with GFR - Protein / creatinine ratio, urine - aspirin  EC 81 MG tablet; Take 2 tablets (162 mg total) by mouth at bedtime. Start taking when you are [redacted] weeks pregnant for rest of pregnancy for prevention of preeclampsia  Dispense: 300 tablet; Refill: 2 - AMB Referral to Cardio Obstetrics - labetalol   (NORMODYNE ) 200 MG tablet; Take 1 tablet (200 mg total) by mouth 2 (two) times daily.  Dispense: 60 tablet; Refill: 3  3. Multigravida of advanced maternal age in first trimester - PANORAMA PRENATAL TEST - US  MFM OB DETAIL +14 WK; Future Serial growth scans, antenatal testing as per protocol later in pregnancy.  4. [redacted]  weeks gestation of pregnancy 5. Supervision of high risk pregnancy, antepartum - CBC/D/Plt+RPR+Rh+ABO+RubIgG... - Hemoglobin A1c - PANORAMA PRENATAL TEST - Culture, OB Urine - US  MFM OB DETAIL +14 WK; Future - TSH Rfx on Abnormal to Free T4 - Prenatal Vit-Fe Phos-FA-Omega (VITAFOL  GUMMIES) 3.33-0.333-34.8 MG CHEW; Chew 1 tablet by mouth daily.  Dispense: 90 tablet; Refill: 5 - Enroll Patient in PreNatal Babyscripts - Babyscripts Schedule Optimization  Initial labs drawn. Continue prenatal vitamins. Problem list reviewed and updated. Genetic Screening discussed, Panorama: ordered. Ultrasound discussed; fetal anatomic survey: scheduled. Anticipatory guidance about prenatal visits given including labs, ultrasounds, and testing. Weight gain recommendations per IOM guidelines reviewed: underweight/BMI 18.5 or less > 28 - 40 lbs; normal weight/BMI 18.5 - 24.9 > 25 - 35 lbs; overweight/BMI 25 - 29.9 > 15 - 25 lbs; obese/BMI 30 or more > 11 - 20 lbs. Discussed usage of the Babyscripts app for more information about pregnancy, and to track blood pressures. Also discussed usage of virtual visits as additional source of managing and completing prenatal visits.  Patient was encouraged to use MyChart to review results, send requests, and have questions addressed.   The nature of Center for [facility] with multiple MDs and Advanced Practice Providers was explained to patient; also emphasized that residents, students are part of our team. Routine obstetric precautions reviewed. Encouraged to seek out care at our office or emergency room [facility] preferred) for  urgent and/or emergent concerns. Return in about 4 weeks (around 12/07/2023) for OFFICE OB VISIT (MD only).      Lenoard Rad, MD, FACOG Obstetrician & Gynecologist, [facility] for [employer], [facility]

## 2023-11-10 ENCOUNTER — Encounter: Payer: Self-pay | Admitting: Obstetrics & Gynecology

## 2023-11-10 LAB — TSH RFX ON ABNORMAL TO FREE T4: TSH: 0.012 u[IU]/mL — ABNORMAL LOW (ref 0.450–4.500)

## 2023-11-10 LAB — PROTEIN / CREATININE RATIO, URINE
Creatinine, Urine: 91.4 mg/dL
Protein, Ur: 7.7 mg/dL
Protein/Creat Ratio: 84 mg/g{creat} (ref 0–200)

## 2023-11-10 LAB — T4F: T4,Free (Direct): 1.38 ng/dL (ref 0.82–1.77)

## 2023-11-11 LAB — CBC/D/PLT+RPR+RH+ABO+RUBIGG...
Antibody Screen: NEGATIVE
Basophils Absolute: 0.1 10*3/uL (ref 0.0–0.2)
Basos: 0 %
EOS (ABSOLUTE): 0.1 10*3/uL (ref 0.0–0.4)
Eos: 1 %
HCV Ab: NONREACTIVE
HIV Screen 4th Generation wRfx: NONREACTIVE
Hematocrit: 37.2 % (ref 34.0–46.6)
Hemoglobin: 12.4 g/dL (ref 11.1–15.9)
Hepatitis B Surface Ag: NEGATIVE
Immature Grans (Abs): 0.2 10*3/uL — ABNORMAL HIGH (ref 0.0–0.1)
Immature Granulocytes: 2 %
Lymphocytes Absolute: 2.2 10*3/uL (ref 0.7–3.1)
Lymphs: 19 %
MCH: 28.4 pg (ref 26.6–33.0)
MCHC: 33.3 g/dL (ref 31.5–35.7)
MCV: 85 fL (ref 79–97)
Monocytes Absolute: 0.7 10*3/uL (ref 0.1–0.9)
Monocytes: 6 %
Neutrophils Absolute: 8.5 10*3/uL — ABNORMAL HIGH (ref 1.4–7.0)
Neutrophils: 72 %
Platelets: 253 10*3/uL (ref 150–450)
RBC: 4.37 x10E6/uL (ref 3.77–5.28)
RDW: 12.3 % (ref 11.7–15.4)
RPR Ser Ql: NONREACTIVE
Rh Factor: POSITIVE
Rubella Antibodies, IGG: 3.63 {index} (ref >0.99)
WBC: 11.7 10*3/uL — ABNORMAL HIGH (ref 3.4–10.8)

## 2023-11-11 LAB — COMPREHENSIVE METABOLIC PANEL WITH GFR
ALT: 15 IU/L (ref 0–32)
AST: 17 IU/L (ref 0–40)
Albumin: 4 g/dL (ref 3.9–4.9)
Alkaline Phosphatase: 63 IU/L (ref 44–121)
BUN/Creatinine Ratio: 13 (ref 9–23)
BUN: 9 mg/dL (ref 6–24)
Bilirubin Total: <0.2 mg/dL (ref 0.0–1.2)
CO2: 22 mmol/L (ref 20–29)
Calcium: 9.3 mg/dL (ref 8.7–10.2)
Chloride: 101 mmol/L (ref 96–106)
Creatinine, Ser: 0.7 mg/dL (ref 0.57–1.00)
Globulin, Total: 3.2 g/dL (ref 1.5–4.5)
Glucose: 81 mg/dL (ref 70–99)
Potassium: 4.1 mmol/L (ref 3.5–5.2)
Sodium: 137 mmol/L (ref 134–144)
Total Protein: 7.2 g/dL (ref 6.0–8.5)
eGFR: 112 mL/min/{1.73_m2} (ref >59)

## 2023-11-11 LAB — HEMOGLOBIN A1C
Est. average glucose Bld gHb Est-mCnc: 111 mg/dL
Hgb A1c MFr Bld: 5.5 % (ref 4.8–5.6)

## 2023-11-11 LAB — HCV INTERPRETATION

## 2023-11-11 LAB — URINE CULTURE, OB REFLEX

## 2023-11-11 LAB — CULTURE, OB URINE

## 2023-11-13 ENCOUNTER — Encounter: Payer: Self-pay | Admitting: Obstetrics & Gynecology

## 2023-11-13 LAB — URINE CYTOLOGY ANCILLARY ONLY
Chlamydia: NEGATIVE
Comment: NEGATIVE
Comment: NEGATIVE
Comment: NORMAL
Neisseria Gonorrhea: NEGATIVE
Trichomonas: NEGATIVE

## 2023-11-14 ENCOUNTER — Encounter: Payer: Self-pay | Admitting: Obstetrics & Gynecology

## 2023-11-16 ENCOUNTER — Encounter: Payer: Self-pay | Admitting: Obstetrics & Gynecology

## 2023-11-16 LAB — PANORAMA PRENATAL TEST FULL PANEL:PANORAMA TEST PLUS 5 ADDITIONAL MICRODELETIONS: FETAL FRACTION: 6.9

## 2023-11-17 ENCOUNTER — Encounter: Payer: Self-pay | Admitting: Obstetrics & Gynecology

## 2023-12-02 LAB — T3, FREE: T3, Free: 3.5 pg/mL (ref 2.0–4.4)

## 2023-12-02 LAB — SPECIMEN STATUS REPORT

## 2023-12-04 ENCOUNTER — Ambulatory Visit: Payer: Self-pay | Admitting: Obstetrics & Gynecology

## 2023-12-14 ENCOUNTER — Encounter: Payer: Self-pay | Admitting: Obstetrics and Gynecology

## 2023-12-14 ENCOUNTER — Ambulatory Visit (INDEPENDENT_AMBULATORY_CARE_PROVIDER_SITE_OTHER): Admitting: Obstetrics and Gynecology

## 2023-12-14 VITALS — BP 131/89 | HR 73 | Wt 174.0 lb

## 2023-12-14 DIAGNOSIS — O10919 Unspecified pre-existing hypertension complicating pregnancy, unspecified trimester: Secondary | ICD-10-CM | POA: Diagnosis not present

## 2023-12-14 DIAGNOSIS — Z3A18 18 weeks gestation of pregnancy: Secondary | ICD-10-CM | POA: Diagnosis not present

## 2023-12-14 DIAGNOSIS — O09522 Supervision of elderly multigravida, second trimester: Secondary | ICD-10-CM | POA: Diagnosis not present

## 2023-12-14 DIAGNOSIS — O09299 Supervision of pregnancy with other poor reproductive or obstetric history, unspecified trimester: Secondary | ICD-10-CM | POA: Diagnosis not present

## 2023-12-14 NOTE — Progress Notes (Signed)
 ROB   CC: None

## 2023-12-14 NOTE — Progress Notes (Signed)
   PRENATAL VISIT NOTE  Subjective:  Brittany [name] is a 43 y.o. [id number] at [redacted]w[redacted]d being seen today for ongoing prenatal care.  She is currently monitored for the following issues for this high-risk pregnancy and has Preexisting hypertension complicating pregnancy, antepartum; Vitamin D  deficiency; History of severe preeclampsia in prior pregnancies; AMA (advanced maternal age) multigravida 35+; Hypertension; and Supervision of high risk pregnancy, antepartum on their problem list.  Patient reports no complaints.  Contractions: Not present. Vag. Bleeding: None.  Movement: Present. Denies leaking of fluid.   The following portions of the patient's history were reviewed and updated as appropriate: allergies, current medications, past family history, past medical history, past social history, past surgical history and problem list.   Objective:    Vitals:   12/14/23 1440  BP: 131/89  Pulse: 73  Weight: 174 lb (78.9 kg)    Fetal Status:  Fetal Heart Rate (bpm): 140   Movement: Present    General: Alert, oriented and cooperative. Patient is in no acute distress.  Skin: Skin is warm and dry. No rash noted.   Cardiovascular: Normal heart rate noted  Respiratory: Normal respiratory effort, no problems with respiration noted  Abdomen: Soft, gravid, appropriate for gestational age.  Pain/Pressure: Absent     Pelvic: Cervical exam deferred        Extremities: Normal range of motion.  Edema: None  Mental Status: Normal mood and affect. Normal behavior. Normal judgment and thought content.   Assessment and Plan:  Pregnancy: [name] at [redacted]w[redacted]d 1. [redacted] weeks gestation of pregnancy (Primary) F/u anatomy u/s. Confirms on low doses - AFP, Serum, Open Spina Bifida  2. Multigravida of advanced maternal age in second trimester Low risk panorama - AFP, Serum, Open Spina Bifida  3. History of severe preeclampsia in prior pregnancies - AFP, Serum, Open Spina Bifida  4. Preexisting hypertension  complicating pregnancy, antepartum Continue labetalol  200 bid.   Preterm labor symptoms and general obstetric precautions including but not limited to vaginal bleeding, contractions, leaking of fluid and fetal movement were reviewed in detail with the patient. Please refer to After Visit Summary for other counseling recommendations.   No follow-ups on file.  Future Appointments  Date Time Provider Department Center  12/21/2023  8:00 AM [facility] PROVIDER 1 [facility]  12/21/2023  8:30 AM [facility] US1 [facility]  01/11/2024  2:30 PM Raynell Caller, MD [facility]  02/08/2024  3:50 PM Granville Layer, MD [facility]  03/21/2024 11:00 AM Tobb, Chyrl Crawford, DO [facility]    Raynell Caller, MD

## 2023-12-16 LAB — AFP, SERUM, OPEN SPINA BIFIDA
AFP MoM: 0.83
AFP Value: 35.9 ng/mL
Gest. Age on Collection Date: 18 wk
Maternal Age At EDD: 41.1 a
OSBR Risk 1 IN: 10000
Test Results:: NEGATIVE
Weight: 174 [lb_av]

## 2023-12-21 ENCOUNTER — Ambulatory Visit: Payer: PRIVATE HEALTH INSURANCE | Admitting: Maternal & Fetal Medicine

## 2023-12-21 ENCOUNTER — Other Ambulatory Visit: Payer: Self-pay | Admitting: *Deleted

## 2023-12-21 ENCOUNTER — Ambulatory Visit: Payer: PRIVATE HEALTH INSURANCE | Attending: Obstetrics & Gynecology

## 2023-12-21 VITALS — BP 128/78 | HR 77

## 2023-12-21 DIAGNOSIS — O10012 Pre-existing essential hypertension complicating pregnancy, second trimester: Secondary | ICD-10-CM

## 2023-12-21 DIAGNOSIS — O09299 Supervision of pregnancy with other poor reproductive or obstetric history, unspecified trimester: Secondary | ICD-10-CM

## 2023-12-21 DIAGNOSIS — O09522 Supervision of elderly multigravida, second trimester: Secondary | ICD-10-CM | POA: Diagnosis not present

## 2023-12-21 DIAGNOSIS — Z3A19 19 weeks gestation of pregnancy: Secondary | ICD-10-CM

## 2023-12-21 DIAGNOSIS — O0990 Supervision of high risk pregnancy, unspecified, unspecified trimester: Secondary | ICD-10-CM | POA: Diagnosis present

## 2023-12-21 DIAGNOSIS — O10919 Unspecified pre-existing hypertension complicating pregnancy, unspecified trimester: Secondary | ICD-10-CM | POA: Diagnosis present

## 2023-12-21 DIAGNOSIS — Z3A13 13 weeks gestation of pregnancy: Secondary | ICD-10-CM

## 2023-12-21 DIAGNOSIS — O09292 Supervision of pregnancy with other poor reproductive or obstetric history, second trimester: Secondary | ICD-10-CM

## 2023-12-21 DIAGNOSIS — O09899 Supervision of other high risk pregnancies, unspecified trimester: Secondary | ICD-10-CM

## 2023-12-21 DIAGNOSIS — O09521 Supervision of elderly multigravida, first trimester: Secondary | ICD-10-CM | POA: Diagnosis not present

## 2023-12-21 NOTE — Progress Notes (Signed)
 Patient information  Patient Name: Brittany [name]  Patient MRN:   [id number]  Referring practice: MFM Referring Provider: [facility]  MFM CONSULT  Brittany [name] is a 43 y.o. [id number] at [redacted]w[redacted]d here for ultrasound and consultation. Patient Active Problem List   Diagnosis Date Noted   Supervision of high risk pregnancy, antepartum 10/05/2023   AMA (advanced maternal age) multigravida 40+ 08/02/2022   History of severe preeclampsia in prior pregnancies 08/01/2022   Vitamin D  deficiency 12/14/2017   Preexisting hypertension complicating pregnancy, antepartum 04/09/2011    Brittany [name] has a pregnancy with the complications mentioned in the problem list. During today's visit we focused on the following concerns:   The patient has a history of preeclampsia and nearly every pregnancy.  She has not started aspirin  yet but I encouraged her to take 162 mg daily to reduce the risk of preeclampsia.  We discussed the importance of serial growth ultrasounds and antenatal testing due to her chronic hypertension which is currently well-controlled on labetalol .  Sonographic findings Single intrauterine pregnancy at 18w 6d. Fetal cardiac activity:  Observed and appears normal. Presentation: Cephalic. The anatomic structures that were well seen appear normal without evidence of soft markers. The anatomic survey is complete.  Fetal biometry shows the estimated fetal weight at the 44 percentile. Amniotic fluid: Within normal limits.  MVP: 6.11 cm. Placenta: Anterior. Adnexa: No abnormality visualized. Cervical length: 3.1 cm.  There are limitations of prenatal ultrasound such as the inability to detect certain abnormalities due to poor visualization. Various factors such as fetal position, gestational age and maternal body habitus may increase the difficulty in visualizing the fetal anatomy.    Recommendations -EDD is Estimated Date of Delivery: 05/15/24. -Detailed  ultrasound was done today without abnormalities. - Start aspirin  162 mg for preeclampsia prophylaxis -Follow-up anatomy and fetal growth in 4 to 6 weeks -Serial growth ultrasounds starting around 28 weeks to monitor for fetal growth restriction -Blood pressure goal of less than 140/90.  Titrate labetalol  to achieve blood pressure goal as necessary throughout the pregnancy. -Baseline EKG to assess for left ventricular hypertrophy.  If there are abnormalities a maternal echo and cardiology consult should be ordered.  -Antenatal testing to start around 32 weeks due to the increased risk of stillbirth and high risk pregnancy -Delivery timing pending clinical course but likely around [redacted] weeks gestation -Continue routine prenatal care with referring OB provider  Review of Systems: A review of systems was performed and was negative except per HPI   Vitals and Physical Exam    12/21/2023    8:04 AM 12/14/2023    2:40 PM 11/09/2023    3:00 PM  Vitals with BMI  Weight  174 lbs   Systolic 128 131 102  Diastolic 78 89 83  Pulse 77 73 87    Sitting comfortably on the sonogram table Nonlabored breathing Normal rate and rhythm Abdomen is nontender  Past pregnancies OB History  Gravida Para Term Preterm AB Living  11 5 2 3 5 5   SAB IAB Ectopic Multiple Live Births  4 1 0 0 5    # Outcome Date GA Lbr Len/2nd Weight Sex Type Anes PTL Lv  11 Current           10 Preterm 12/13/22 102w5d  4 lb 8 oz (2.041 kg) F Vag-Spont EPI  LIV     Birth Comments: Induced for severe preeclampsia     Complications: Severe preeclampsia  9 Preterm 06/13/18 [redacted]w[redacted]d 07:04 / 00:15 5 lb 1.1 oz (2.3 kg) F Vag-Spont EPI  LIV     Birth Comments: Induced for severe preeclampsia     Complications: Severe preeclampsia  8 Term 05/03/16 [redacted]w[redacted]d 00:08 / 00:01 4 lb 12.7 oz (2.175 kg) M Vag-Spont None  LIV     Birth Comments: Mild preeclampsia     Complications: Preeclampsia  7 Preterm 04/06/11 [redacted]w[redacted]d 08:25 / 00:08 5 lb 6.6 oz  (2.455 kg) F Vag-Spont None  LIV     Birth Comments: Mild preeclampsia     Complications: Preeclampsia  6 Term 10/21/05 [redacted]w[redacted]d    Vag-Spont   LIV     Complications: Failure to Progress in Second Stage  5 IAB           4 SAB           3 SAB           2 SAB           1 SAB              I spent 30 minutes reviewing the patients chart, including labs and images as well as counseling the patient about her medical conditions. Greater than 50% of the time was spent in direct face-to-face patient counseling.  Brittany Bowling, DO Maternal fetal medicine, [place]   12/21/2023  9:23 AM

## 2023-12-28 ENCOUNTER — Encounter: Payer: Self-pay | Admitting: Obstetrics & Gynecology

## 2024-01-07 ENCOUNTER — Telehealth: Payer: PRIVATE HEALTH INSURANCE | Admitting: Family Medicine

## 2024-01-07 ENCOUNTER — Encounter: Payer: Self-pay | Admitting: Obstetrics and Gynecology

## 2024-01-07 DIAGNOSIS — Z3490 Encounter for supervision of normal pregnancy, unspecified, unspecified trimester: Secondary | ICD-10-CM

## 2024-01-07 NOTE — Progress Notes (Signed)
 Pt advised to call OB[facility]

## 2024-01-09 ENCOUNTER — Other Ambulatory Visit: Payer: Self-pay | Admitting: Obstetrics and Gynecology

## 2024-01-09 MED ORDER — MAG GLYCINATE 100 MG PO TABS: 1.0000 | ORAL_TABLET | Freq: Every day | ORAL | 1 refills | Status: DC

## 2024-01-11 ENCOUNTER — Ambulatory Visit: Admitting: Obstetrics and Gynecology

## 2024-01-11 VITALS — BP 122/76 | HR 82 | Wt 177.0 lb

## 2024-01-11 DIAGNOSIS — O26892 Other specified pregnancy related conditions, second trimester: Secondary | ICD-10-CM | POA: Diagnosis not present

## 2024-01-11 DIAGNOSIS — O09522 Supervision of elderly multigravida, second trimester: Secondary | ICD-10-CM

## 2024-01-11 DIAGNOSIS — R519 Headache, unspecified: Secondary | ICD-10-CM

## 2024-01-11 DIAGNOSIS — O10919 Unspecified pre-existing hypertension complicating pregnancy, unspecified trimester: Secondary | ICD-10-CM

## 2024-01-11 DIAGNOSIS — Z3A22 22 weeks gestation of pregnancy: Secondary | ICD-10-CM

## 2024-01-11 DIAGNOSIS — O09299 Supervision of pregnancy with other poor reproductive or obstetric history, unspecified trimester: Secondary | ICD-10-CM

## 2024-01-11 MED ORDER — BUTALBITAL-APAP-CAFFEINE 50-325-40 MG PO CAPS: 1.0000 | ORAL_CAPSULE | Freq: Once | ORAL | 0 refills | Status: DC | PRN

## 2024-01-11 MED ORDER — METOCLOPRAMIDE HCL 10 MG PO TABS: 10.0000 mg | ORAL_TABLET | Freq: Four times a day (QID) | ORAL | 0 refills | Status: DC | PRN

## 2024-01-11 MED ORDER — BUTALBITAL-APAP-CAFFEINE 50-325-40 MG PO CAPS
1.0000 | ORAL_CAPSULE | Freq: Once | ORAL | 0 refills | Status: DC | PRN
Start: 2024-01-11 — End: 2024-01-11

## 2024-01-11 NOTE — Progress Notes (Signed)
   PRENATAL VISIT NOTE  Subjective:  Brittany [name] is a 43 y.o. [id number] at [redacted]w[redacted]d being seen today for ongoing prenatal care.  She is currently monitored for the following issues for this high-risk pregnancy and has Preexisting hypertension complicating pregnancy, antepartum; Vitamin D  deficiency; History of severe preeclampsia in prior pregnancies; AMA (advanced maternal age) multigravida 60+; and Supervision of high risk pregnancy, antepartum on their problem list.  Patient reports headaches continue.  Contractions: Irritability. Vag. Bleeding: None.  Movement: Present. Denies leaking of fluid.   The following portions of the patient's history were reviewed and updated as appropriate: allergies, current medications, past family history, past medical history, past social history, past surgical history and problem list.   Objective:    Vitals:   01/11/24 1440  BP: 122/76  Pulse: 82  Weight: 177 lb (80.3 kg)    Fetal Status:  Fetal Heart Rate (bpm): 150   Movement: Present    General: Alert, oriented and cooperative. Patient is in no acute distress.  Skin: Skin is warm and dry. No rash noted.   Cardiovascular: Normal heart rate noted  Respiratory: Normal respiratory effort, no problems with respiration noted  Abdomen: Soft, gravid, appropriate for gestational age.  Pain/Pressure: Present     Pelvic: Cervical exam deferred        Extremities: Normal range of motion.  Edema: None  Mental Status: Normal mood and affect. Normal behavior. Normal judgment and thought content.   Assessment and Plan:  Pregnancy: [id number] at [redacted]w[redacted]d 1. Pregnancy headache in second trimester (Primary) No h/o HA outside of pregnancy and Mg didn't seem to help. Pt feels like she's well hydrated but only getting about 5-6h of sleep per night due to life obligations. Pt unsure if labetalol  is contributing and she doesn't consume caffeine . I asked her to try and get 7-8h of sleep per night, stay well hydrated,  snack frequently and continue with the mg and add on reglan . Pt wondering about reglan  as this helped with HAs in past pregnancy but I told her that this can cause rebound HAs and not preferred. I told her I will send in a PRN one dose of it to break headache.  If HAs continue at next visit. Consider imaging  2. [redacted] weeks gestation of pregnancy  3. Multigravida of advanced maternal age in second trimester  4. Preexisting hypertension complicating pregnancy, antepartum Continue labetalol  200 bid and low dose ASA. Follow up growth scan upcoming  5. History of severe preeclampsia in prior pregnancies  Preterm labor symptoms and general obstetric precautions including but not limited to vaginal bleeding, contractions, leaking of fluid and fetal movement were reviewed in detail with the patient. Please refer to After Visit Summary for other counseling recommendations.   No follow-ups on file.  Future Appointments  Date Time Provider Department Center  02/01/2024  9:00 AM [facility] PROVIDER 1 [facility]  02/01/2024  9:30 AM [facility] US3 [facility]  02/08/2024  3:50 PM Constant, Winton, MD [facility]  03/21/2024 11:00 AM Tobb, Dub, DO [facility]    Bebe Furry, MD

## 2024-01-11 NOTE — Progress Notes (Signed)
 ROB   CC: Headaches and no relief w/ recent OTC treatment. Pt wants to discuss Rx to help.

## 2024-01-12 ENCOUNTER — Encounter: Payer: Self-pay | Admitting: Obstetrics and Gynecology

## 2024-02-01 ENCOUNTER — Other Ambulatory Visit: Payer: Self-pay | Admitting: *Deleted

## 2024-02-01 ENCOUNTER — Ambulatory Visit: Payer: PRIVATE HEALTH INSURANCE

## 2024-02-01 ENCOUNTER — Encounter: Payer: Self-pay | Admitting: Obstetrics & Gynecology

## 2024-02-01 ENCOUNTER — Ambulatory Visit: Payer: PRIVATE HEALTH INSURANCE | Attending: Maternal & Fetal Medicine | Admitting: Obstetrics and Gynecology

## 2024-02-01 VITALS — BP 123/77 | HR 79

## 2024-02-01 DIAGNOSIS — O09299 Supervision of pregnancy with other poor reproductive or obstetric history, unspecified trimester: Secondary | ICD-10-CM

## 2024-02-01 DIAGNOSIS — O09292 Supervision of pregnancy with other poor reproductive or obstetric history, second trimester: Secondary | ICD-10-CM | POA: Insufficient documentation

## 2024-02-01 DIAGNOSIS — Z362 Encounter for other antenatal screening follow-up: Secondary | ICD-10-CM | POA: Diagnosis not present

## 2024-02-01 DIAGNOSIS — O0990 Supervision of high risk pregnancy, unspecified, unspecified trimester: Secondary | ICD-10-CM

## 2024-02-01 DIAGNOSIS — O09522 Supervision of elderly multigravida, second trimester: Secondary | ICD-10-CM | POA: Diagnosis present

## 2024-02-01 DIAGNOSIS — O10012 Pre-existing essential hypertension complicating pregnancy, second trimester: Secondary | ICD-10-CM | POA: Diagnosis not present

## 2024-02-01 DIAGNOSIS — Z3A25 25 weeks gestation of pregnancy: Secondary | ICD-10-CM

## 2024-02-01 DIAGNOSIS — O10919 Unspecified pre-existing hypertension complicating pregnancy, unspecified trimester: Secondary | ICD-10-CM

## 2024-02-01 DIAGNOSIS — O0942 Supervision of pregnancy with grand multiparity, second trimester: Secondary | ICD-10-CM | POA: Insufficient documentation

## 2024-02-01 DIAGNOSIS — O09899 Supervision of other high risk pregnancies, unspecified trimester: Secondary | ICD-10-CM | POA: Diagnosis present

## 2024-02-01 DIAGNOSIS — O09212 Supervision of pregnancy with history of pre-term labor, second trimester: Secondary | ICD-10-CM

## 2024-02-01 DIAGNOSIS — Z3A24 24 weeks gestation of pregnancy: Secondary | ICD-10-CM | POA: Insufficient documentation

## 2024-02-01 MED ORDER — NIFEDIPINE ER OSMOTIC RELEASE 30 MG PO TB24: 30.0000 mg | ORAL_TABLET | Freq: Every day | ORAL | 2 refills | Status: DC

## 2024-02-01 NOTE — Progress Notes (Signed)
 After review, MFM consult with provider is not indicated for today  Arna Ranks, MD 02/01/2024 12:02 PM  [facility]

## 2024-02-01 NOTE — Addendum Note (Signed)
 Addended by: HERCHEL GRUMET A on: 02/01/2024 02:02 PM   Modules accepted: Orders

## 2024-02-08 ENCOUNTER — Ambulatory Visit (INDEPENDENT_AMBULATORY_CARE_PROVIDER_SITE_OTHER): Payer: PRIVATE HEALTH INSURANCE | Admitting: Obstetrics & Gynecology

## 2024-02-08 ENCOUNTER — Encounter: Payer: Self-pay | Admitting: Obstetrics & Gynecology

## 2024-02-08 ENCOUNTER — Other Ambulatory Visit (HOSPITAL_COMMUNITY)
Admission: RE | Admit: 2024-02-08 | Discharge: 2024-02-08 | Disposition: A | Discharge status: Home / Self Care | Source: Ambulatory Visit | Attending: Obstetrics & Gynecology | Admitting: Obstetrics & Gynecology

## 2024-02-08 VITALS — BP 154/88 | HR 80 | Wt 174.0 lb

## 2024-02-08 DIAGNOSIS — O09299 Supervision of pregnancy with other poor reproductive or obstetric history, unspecified trimester: Secondary | ICD-10-CM

## 2024-02-08 DIAGNOSIS — O4702 False labor before 37 completed weeks of gestation, second trimester: Secondary | ICD-10-CM | POA: Insufficient documentation

## 2024-02-08 DIAGNOSIS — Z3A25 25 weeks gestation of pregnancy: Secondary | ICD-10-CM

## 2024-02-08 DIAGNOSIS — O09522 Supervision of elderly multigravida, second trimester: Secondary | ICD-10-CM

## 2024-02-08 DIAGNOSIS — O10919 Unspecified pre-existing hypertension complicating pregnancy, unspecified trimester: Secondary | ICD-10-CM

## 2024-02-08 DIAGNOSIS — O0990 Supervision of high risk pregnancy, unspecified, unspecified trimester: Secondary | ICD-10-CM

## 2024-02-08 NOTE — Progress Notes (Signed)
 PRENATAL VISIT NOTE  Subjective:  Brittany [name] is a 43 y.o. [id number] at [redacted]w[redacted]d being seen today for ongoing prenatal care.  She is currently monitored for the following issues for this high-risk pregnancy and has Preexisting hypertension complicating pregnancy, antepartum; Vitamin D  deficiency; History of severe preeclampsia in prior pregnancies; AMA (advanced maternal age) multigravida 58+; and Supervision of high risk pregnancy, antepartum on their problem list.  Patient reports frequent contractions for the past few days that are painful. Irregular but she had a lot of them.  Contractions: Irregular. Vag. Bleeding: None.  Movement: Present. Denies leaking of fluid.   Patient denies any headaches, visual symptoms, RUQ/epigastric pain or other concerning symptoms.  Has not picked up her Nifedipine  prescription.  The following portions of the patient's history were reviewed and updated as appropriate: allergies, current medications, past family history, past medical history, past social history, past surgical history and problem list.   Objective:    Vitals:   02/08/24 1547  BP: (!) 154/88  Pulse: 80  Weight: 174 lb (78.9 kg)    Fetal Status:  Fetal Heart Rate (bpm): 154   Movement: Present    General: Alert, oriented and cooperative. Patient is in no acute distress.  Skin: Skin is warm and dry. No rash noted.   Cardiovascular: Normal heart rate noted  Respiratory: Normal respiratory effort, no problems with respiration noted  Abdomen: Soft, gravid, appropriate for gestational age.  Pain/Pressure: Present     Pelvic: Sterile cervical exam performed in the presence of a chaperone Dilation: Closed (Multiparous fingertip) Effacement (%): Thick Station: Ballotable  Extremities: Normal range of motion.  Edema: None  Mental Status: Normal mood and affect. Normal behavior. Normal judgment and thought content.   Assessment and Plan:  Pregnancy: [id number] at [redacted]w[redacted]d 1. Preterm uterine  contractions in second trimester, antepartum (Primary) Closed cervix on examination, reassured by this. Will follow up urine and pelvic cultures. Adequate hydration recommended, also Nifedipine  that is prescribed for her HTN may help with this. PTL precautions advised. - Culture, OB Urine - Cervicovaginal ancillary only  2. Preexisting hypertension complicating pregnancy, antepartum 3. History of severe preeclampsia in prior pregnancies Did not take meds yet, but given her history, labs checked today. Will follow up results and manage accordingly. Preeclampsia precautions reviewed. Advised to pick up Nifedipine  and take as directed, will return next week for BP check. Continue growth scans and antenatal testing plans as per MFM. - CBC - Comprehensive metabolic panel with GFR - Protein / creatinine ratio, urine  4. Multigravida of advanced maternal age in second trimester LR NIPS.  Will already have antenatal testing later in pregnancy due to HTN.  5. [redacted] weeks gestation of pregnancy 6. Supervision of high risk pregnancy, antepartum No other concerns.  GTT next visit. Preterm labor symptoms and general obstetric precautions including but not limited to vaginal bleeding, contractions, leaking of fluid and fetal movement were reviewed in detail with the patient. Please refer to After Visit Summary for other counseling recommendations.   Return in about 2 weeks (around 02/22/2024) for 2 hr GTT, 3rd trimester labs, TDap, OFFICE OB VISIT (MD only).  Future Appointments  Date Time Provider Department Center  02/15/2024  1:50 PM [facility] NURSE [facility]  02/22/2024  8:00 AM [facility] LAB [facility]  02/22/2024  8:35 AM Fredirick Glenys RAMAN, MD [facility]  02/22/2024  2:45 PM [facility] PROVIDER 1 [facility]  02/22/2024  3:00 PM [facility] US2 [facility]  03/07/2024 10:55 AM  Alger Gong, MD [facility]  03/21/2024 11:00 AM Tobb, Kardie, DO [facility]     Gloris Hugger, MD

## 2024-02-08 NOTE — Patient Instructions (Signed)
 Oral Glucose Tolerance Test During Pregnancy Why am I having this test? The oral glucose tolerance test (GTT) is done to check how your body processes blood sugar (glucose). This is one of several tests used to diagnose diabetes that develops during pregnancy (gestational diabetes mellitus). Gestational diabetes is a short-term form of diabetes that some women develop while they are pregnant. It usually occurs during the second or third trimester of pregnancy and goes away after delivery. Testing, or screening, for gestational diabetes usually occurs around 69 of pregnancy. This test may also be needed earlier if: You have a history of gestational diabetes. There is a history of giving birth to very large babies or of losing pregnancies (having stillbirths). You have signs and symptoms of diabetes, such as: Changes in your eyesight. Tingling or numbness in your hands or feet. Changes in hunger, thirst, and urination, and these are not explained by your pregnancy. What is being tested? This test measures the amount of glucose in your blood at different times during a period of 2 hours. This shows how well your body can process glucose.  You will have three separate blood draws. What kind of sample is taken?  Blood samples are required for this test. They are usually collected by inserting a needle into a blood vessel. How do I prepare for this test? For 3 days before your test, eat normally. Have plenty of carbohydrate-rich foods. You will be asked not to eat or drink anything other than water (to fast) starting 8-10 hours before the test. Tell a health care provider about: All medicines you are taking, including vitamins, herbs, eye drops, creams, and over-the-counter medicines. Any blood disorders you have. Any surgeries you have had. Any medical conditions you have. What happens during the test? First, your blood glucose will be measured. This is referred to as your fasting blood glucose  because you fasted before the test. Then, you will drink a glucose solution that contains a certain amount of glucose. Your blood glucose will be measured again 1 and 2 hours after you drink the solution. This test takes about 2 hours to complete. You will need to stay at the testing location during this time. During the testing period: Do not eat or drink anything other than the glucose solution. Do not exercise. Do not use any products that contain nicotine or tobacco, such as cigarettes, e-cigarettes, and chewing tobacco. These can affect your test results. If you need help quitting, ask your health care provider. The testing procedure may vary among health care providers and hospitals. How are the results reported? Your results will be reported as milligrams of glucose per deciliter of blood (mg/dL) or millimoles per liter (mmol/L). There is more than one source for screening and diagnosis reference values used to diagnose gestational diabetes. Your health care provider will compare your results to normal values that were established after testing a large group of people (reference values). Reference values may vary among labs and hospitals. For this test, reference values are: Fasting: 92 mg/dL 1 hour: 161 mg/dL  2 hour: 096 mg/dL   What do the results mean? Results below the reference values are considered normal. If one or more of your blood glucose levels are at or above the reference values, you will be diagnosed with gestational diabetes.  Talk with your health care provider about what your results mean. Questions to ask your health care provider Ask your health care provider, or the department that is doing the test: When  will my results be ready? How will I get my results? What are my treatment options? What other tests do I need? What are my next steps? Summary The oral glucose tolerance test (GTT) is one of several tests used to diagnose diabetes that develops during pregnancy  (gestational diabetes mellitus). Gestational diabetes is a short-term form of diabetes that some women develop while they are pregnant. You may also have this test if you have any symptoms or risk factors for this type of diabetes. Talk with your health care provider about what your results mean. This information is not intended to replace advice given to you by your health care provider. Make sure you discuss any questions you have with your health care provider.  TDaP Vaccine Pregnancy Get the Whooping Cough Vaccine While You Are Pregnant (CDC)  It is important for women to get the whooping cough vaccine in the third trimester of each pregnancy. Vaccines are the best way to prevent this disease. There are 2 different whooping cough vaccines. Both vaccines combine protection against whooping cough, tetanus and diphtheria, but they are for different age groups: Tdap: for everyone 11 years or older, including pregnant women  DTaP: for children 2 months through 45 years of age  You need the whooping cough vaccine during each of your pregnancies The recommended time to get the shot is during your 27th through 36th week of pregnancy, preferably during the earlier part of this time period. The Centers for Disease Control and Prevention (CDC) recommends that pregnant women receive the whooping cough vaccine for adolescents and adults (called Tdap vaccine) during the third trimester of each pregnancy. The recommended time to get the shot is during your 27th through 36th week of pregnancy, preferably during the earlier part of this time period. This replaces the original recommendation that pregnant women get the vaccine only if they had not previously received it. The [employer]nec[employer] and the [employer] support this recommendation.  You should get the whooping cough vaccine while pregnant to pass protection to your baby frame support disabled  and/or not supported in this browser  Learn why [name] decided to get the whooping cough vaccine in her 3rd trimester of pregnancy and how her baby girl was born with some protection against the disease. Also available on YouTube. After receiving the whooping cough vaccine, your body will create protective antibodies (proteins produced by the body to fight off diseases) and pass some of them to your baby before birth. These antibodies provide your baby some short-term protection against whooping cough in early life. These antibodies can also protect your baby from some of the more serious complications that come along with whooping cough. Your protective antibodies are at their highest about 2 weeks after getting the vaccine, but it takes time to pass them to your baby. So the preferred time to get the whooping cough vaccine is early in your third trimester. The amount of whooping cough antibodies in your body decreases over time. That is why CDC recommends you get a whooping cough vaccine during each pregnancy. Doing so allows each of your babies to get the greatest number of protective antibodies from you. This means each of your babies will get the best protection possible against this disease.  Getting the whooping cough vaccine while pregnant is better than getting the vaccine after you give birth Whooping cough vaccination during pregnancy is ideal so your baby will have short-term protection as soon as he  is born. This early protection is important because your baby will not start getting his whooping cough vaccines until he is 2 months old. These first few months of life are when your baby is at greatest risk for catching whooping cough. This is also when he's at greatest risk for having severe, potentially life-threating complications from the infection. To avoid that gap in protection, it is best to get a whooping cough vaccine during pregnancy. You will then pass protection to your baby before he  is born. To continue protecting your baby, he should get whooping cough vaccines starting at 2 months old. You may never have gotten the Tdap vaccine before and did not get it during this pregnancy. If so, you should make sure to get the vaccine immediately after you give birth, before leaving the hospital or birthing center. It will take about 2 weeks before your body develops protection (antibodies) in response to the vaccine. Once you have protection from the vaccine, you are less likely to give whooping cough to your newborn while caring for him. But remember, your baby will still be at risk for catching whooping cough from others. A recent study looked to see how effective Tdap was at preventing whooping cough in babies whose mothers got the vaccine while pregnant or in the hospital after giving birth. The study found that getting Tdap between 27 through 36 weeks of pregnancy is 85% more effective at preventing whooping cough in babies younger than 2 months old. Blood tests cannot tell if you need a whooping cough vaccine There are no blood tests that can tell you if you have enough antibodies in your body to protect yourself or your baby against whooping cough. Even if you have been sick with whooping cough in the past or previously received the vaccine, you still should get the vaccine during each pregnancy. Breastfeeding may pass some protective antibodies onto your baby By breastfeeding, you may pass some antibodies you have made in response to the vaccine to your baby. When you get a whooping cough vaccine during your pregnancy, you will have antibodies in your breast milk that you can share with your baby as soon as your milk comes in. However, your baby will not get protective antibodies immediately if you wait to get the whooping cough vaccine until after delivering your baby. This is because it takes about 2 weeks for your body to create antibodies. Learn more about the health benefits of  breastfeeding.

## 2024-02-09 ENCOUNTER — Ambulatory Visit: Payer: Self-pay | Admitting: Obstetrics & Gynecology

## 2024-02-09 DIAGNOSIS — O23592 Infection of other part of genital tract in pregnancy, second trimester: Secondary | ICD-10-CM

## 2024-02-09 LAB — COMPREHENSIVE METABOLIC PANEL WITH GFR
ALT: 13 IU/L (ref 0–32)
AST: 21 IU/L (ref 0–40)
Albumin: 3.8 g/dL — ABNORMAL LOW (ref 3.9–4.9)
Alkaline Phosphatase: 79 IU/L (ref 44–121)
BUN/Creatinine Ratio: 12 (ref 9–23)
BUN: 7 mg/dL (ref 6–24)
Bilirubin Total: 0.3 mg/dL (ref 0.0–1.2)
CO2: 19 mmol/L — ABNORMAL LOW (ref 20–29)
Calcium: 8.9 mg/dL (ref 8.7–10.2)
Chloride: 101 mmol/L (ref 96–106)
Creatinine, Ser: 0.6 mg/dL (ref 0.57–1.00)
Globulin, Total: 3.5 g/dL (ref 1.5–4.5)
Glucose: 74 mg/dL (ref 70–99)
Potassium: 3.6 mmol/L (ref 3.5–5.2)
Sodium: 136 mmol/L (ref 134–144)
Total Protein: 7.3 g/dL (ref 6.0–8.5)
eGFR: 116 mL/min/1.73 (ref >59)

## 2024-02-09 LAB — CBC
Hematocrit: 38.2 % (ref 34.0–46.6)
Hemoglobin: 12.6 g/dL (ref 11.1–15.9)
MCH: 28.6 pg (ref 26.6–33.0)
MCHC: 33 g/dL (ref 31.5–35.7)
MCV: 87 fL (ref 79–97)
Platelets: 241 x10E3/uL (ref 150–450)
RBC: 4.4 x10E6/uL (ref 3.77–5.28)
RDW: 12.8 % (ref 11.7–15.4)
WBC: 11.9 x10E3/uL — ABNORMAL HIGH (ref 3.4–10.8)

## 2024-02-09 LAB — PROTEIN / CREATININE RATIO, URINE
Creatinine, Urine: 266.9 mg/dL
Protein, Ur: 32.7 mg/dL
Protein/Creat Ratio: 123 mg/g{creat} (ref 0–200)

## 2024-02-10 LAB — URINE CULTURE, OB REFLEX

## 2024-02-10 LAB — CULTURE, OB URINE

## 2024-02-12 ENCOUNTER — Encounter: Payer: Self-pay | Admitting: Obstetrics & Gynecology

## 2024-02-12 LAB — CERVICOVAGINAL ANCILLARY ONLY
Bacterial Vaginitis (gardnerella): POSITIVE — AB
Candida Glabrata: NEGATIVE
Candida Vaginitis: POSITIVE — AB
Chlamydia: NEGATIVE
Comment: NEGATIVE
Comment: NEGATIVE
Comment: NEGATIVE
Comment: NEGATIVE
Comment: NEGATIVE
Comment: NORMAL
Neisseria Gonorrhea: NEGATIVE
Trichomonas: NEGATIVE

## 2024-02-12 MED ORDER — TERCONAZOLE 0.4 % VA CREA: 1.0000 | TOPICAL_CREAM | Freq: Every day | VAGINAL | 0 refills | Status: DC

## 2024-02-12 MED ORDER — METRONIDAZOLE 500 MG PO TABS: 500.0000 mg | ORAL_TABLET | Freq: Two times a day (BID) | ORAL | 0 refills | Status: AC

## 2024-02-14 ENCOUNTER — Encounter: Payer: Self-pay | Admitting: Obstetrics & Gynecology

## 2024-02-15 ENCOUNTER — Ambulatory Visit

## 2024-02-21 ENCOUNTER — Inpatient Hospital Stay (HOSPITAL_COMMUNITY)
Admission: AD | Admit: 2024-02-21 | Discharge: 2024-02-21 | Disposition: A | Discharge status: Home / Self Care | Payer: PRIVATE HEALTH INSURANCE | Attending: Obstetrics and Gynecology | Admitting: Obstetrics and Gynecology

## 2024-02-21 ENCOUNTER — Other Ambulatory Visit: Payer: Self-pay

## 2024-02-21 ENCOUNTER — Encounter (HOSPITAL_COMMUNITY): Payer: Self-pay | Admitting: Obstetrics and Gynecology

## 2024-02-21 ENCOUNTER — Encounter: Payer: Self-pay | Admitting: Obstetrics & Gynecology

## 2024-02-21 DIAGNOSIS — I10 Essential (primary) hypertension: Secondary | ICD-10-CM | POA: Diagnosis not present

## 2024-02-21 DIAGNOSIS — Z3A27 27 weeks gestation of pregnancy: Secondary | ICD-10-CM | POA: Diagnosis not present

## 2024-02-21 DIAGNOSIS — O26892 Other specified pregnancy related conditions, second trimester: Secondary | ICD-10-CM | POA: Diagnosis not present

## 2024-02-21 DIAGNOSIS — O10912 Unspecified pre-existing hypertension complicating pregnancy, second trimester: Secondary | ICD-10-CM | POA: Diagnosis present

## 2024-02-21 DIAGNOSIS — R519 Headache, unspecified: Secondary | ICD-10-CM | POA: Diagnosis not present

## 2024-02-21 DIAGNOSIS — O0990 Supervision of high risk pregnancy, unspecified, unspecified trimester: Secondary | ICD-10-CM

## 2024-02-21 LAB — COMPREHENSIVE METABOLIC PANEL WITH GFR
ALT: 14 U/L (ref 0–44)
AST: 19 U/L (ref 15–41)
Albumin: 2.7 g/dL — ABNORMAL LOW (ref 3.5–5.0)
Alkaline Phosphatase: 71 U/L (ref 38–126)
Anion gap: 12 (ref 5–15)
BUN: 5 mg/dL — ABNORMAL LOW (ref 6–20)
CO2: 19 mmol/L — ABNORMAL LOW (ref 22–32)
Calcium: 8.5 mg/dL — ABNORMAL LOW (ref 8.9–10.3)
Chloride: 105 mmol/L (ref 98–111)
Creatinine, Ser: 0.57 mg/dL (ref 0.44–1.00)
GFR, Estimated: >60 mL/min (ref >60)
Glucose, Bld: 79 mg/dL (ref 70–99)
Potassium: 3.4 mmol/L — ABNORMAL LOW (ref 3.5–5.1)
Sodium: 136 mmol/L (ref 135–145)
Total Bilirubin: 0.4 mg/dL (ref 0.0–1.2)
Total Protein: 6.7 g/dL (ref 6.5–8.1)

## 2024-02-21 LAB — URINALYSIS, ROUTINE W REFLEX MICROSCOPIC
Bilirubin Urine: NEGATIVE
Glucose, UA: NEGATIVE mg/dL
Hgb urine dipstick: NEGATIVE
Ketones, ur: NEGATIVE mg/dL
Leukocytes,Ua: NEGATIVE
Nitrite: NEGATIVE
Protein, ur: NEGATIVE mg/dL
Specific Gravity, Urine: 1.018 (ref 1.005–1.030)
pH: 6 (ref 5.0–8.0)

## 2024-02-21 LAB — CBC
HCT: 35.2 % — ABNORMAL LOW (ref 36.0–46.0)
Hemoglobin: 11.8 g/dL — ABNORMAL LOW (ref 12.0–15.0)
MCH: 28.1 pg (ref 26.0–34.0)
MCHC: 33.5 g/dL (ref 30.0–36.0)
MCV: 83.8 fL (ref 80.0–100.0)
Platelets: 229 K/uL (ref 150–400)
RBC: 4.2 MIL/uL (ref 3.87–5.11)
RDW: 13.3 % (ref 11.5–15.5)
WBC: 11.3 K/uL — ABNORMAL HIGH (ref 4.0–10.5)
nRBC: 0 % (ref 0.0–0.2)

## 2024-02-21 LAB — PROTEIN / CREATININE RATIO, URINE
Creatinine, Urine: 140 mg/dL
Protein Creatinine Ratio: 0.08 mg/mg{creat} (ref 0.00–0.15)
Total Protein, Urine: 11 mg/dL

## 2024-02-21 MED ORDER — ONDANSETRON 4 MG PO TBDP
8.0000 mg | ORAL_TABLET | Freq: Once | ORAL | Status: AC
  Administered 2024-02-21: 8 mg via ORAL
  Filled 2024-02-21: qty 2

## 2024-02-21 MED ORDER — METOCLOPRAMIDE HCL 10 MG PO TABS
10.0000 mg | ORAL_TABLET | Freq: Once | ORAL | Status: AC
  Administered 2024-02-21: 10 mg via ORAL
  Filled 2024-02-21: qty 1

## 2024-02-21 MED ORDER — LABETALOL HCL 300 MG PO TABS: 300.0000 mg | ORAL_TABLET | Freq: Two times a day (BID) | ORAL | 2 refills | Status: DC

## 2024-02-21 MED ORDER — LABETALOL HCL 100 MG PO TABS
300.0000 mg | ORAL_TABLET | Freq: Two times a day (BID) | ORAL | Status: DC
  Administered 2024-02-21: 300 mg via ORAL
  Filled 2024-02-21: qty 3

## 2024-02-21 NOTE — MAU Provider Note (Addendum)
 Elevated BP, HA     S Ms. Staphanie Harbison is a 43 y.o. [id number] pregnant female at [redacted]w[redacted]d who presents to MAU today with complaint of HA and Nausea in s/o elevated BPs. Pt states took tylenol  1g at 1630 w/o relief and reports home BP 160/100.  Pt states procardia  taken at home.  Denies LOF, VB, ctx, CP, SOB, RUQ / epigastric pain, or visual disturbances.  Endorses +FM.     When discussing her Procardia  and antihypertensive use hx pt is a poor historian.  Per chart review it appears that at end of 10/2023 she was switched from Procardia  30mg  2/2 HA.  Notes states labetalol  BID dosing until 02/08/24 in which it states Did not take meds yet ... Advised to pick up Procardia  and take as directed.  Pt shows me a MAR on her patient portal that had only nifedipine  on it and she points to it stating that's what she is taking, pt reports taking Procardia  BID but written once daily.  Unclear if she had actually picked up and taken labetalol . Pt states last dose of procardia  was this morning.   Receives care at [facility]. Prenatal records reviewed.  Pertinent items noted in HPI and remainder of comprehensive ROS otherwise negative.   O BP 126/81 (BP Location: Right Arm)   Pulse 76   Temp 99.1 F (37.3 C) (Oral)   Resp 15   Ht 5' 5 (1.651 m)   Wt 78.5 kg   LMP 08/11/2023   SpO2 100%   BMI 28.79 kg/m  Physical Exam Constitutional:      General: She is not in acute distress.    Appearance: She is well-developed. She is not ill-appearing.  HENT:     Head: Normocephalic and atraumatic.     Mouth/Throat:     Mouth: Mucous membranes are moist.     Pharynx: Oropharynx is clear.  Eyes:     Extraocular Movements: Extraocular movements intact.  Cardiovascular:     Rate and Rhythm: Normal rate.  Pulmonary:     Effort: Pulmonary effort is normal. No respiratory distress.  Abdominal:     General: There is no distension.     Palpations: Abdomen is soft.     Tenderness: There is no abdominal tenderness.      Comments: gravid  Musculoskeletal:        General: No swelling. Normal range of motion.     Cervical back: Normal range of motion.  Skin:    General: Skin is warm and dry.  Neurological:     Mental Status: She is alert and oriented to person, place, and time.     Motor: No weakness.     Gait: Gait normal.  Psychiatric:        Mood and Affect: Mood normal.        Speech: Speech normal.        Behavior: Behavior normal.     NST: 140bpm, moderate variability, + accels for GA, no decels, no ctx   MDM: MAU Course:  Reassured by NST, Bps mild range and normotensive.  Believe iatrogenic HA given HA previously with procardia .  Can switch to Labetalol .  Signed out to [facility] pending improved nausea, HA, Bps, nrl PEC labs.   Augustin Slade, MD Family Medicine - Obstetrics Fellow    AP #[redacted] weeks gestation #CHTN #HA   Slade Augustin BROCKS, MD 02/21/2024 7:09 PM    CNM assumed care at 2000.   Results for orders placed or  performed during the hospital encounter of 02/21/24 (from the past 24 hours)  Urinalysis, Routine w reflex microscopic -Urine, Clean Catch     Status: None   Collection Time: 02/21/24  7:09 PM  Result Value Ref Range   Color, Urine YELLOW YELLOW   APPearance CLEAR CLEAR   Specific Gravity, Urine 1.018 1.005 - 1.030   pH 6.0 5.0 - 8.0   Glucose, UA NEGATIVE NEGATIVE mg/dL   Hgb urine dipstick NEGATIVE NEGATIVE   Bilirubin Urine NEGATIVE NEGATIVE   Ketones, ur NEGATIVE NEGATIVE mg/dL   Protein, ur NEGATIVE NEGATIVE mg/dL   Nitrite NEGATIVE NEGATIVE   Leukocytes,Ua NEGATIVE NEGATIVE  Protein / creatinine ratio, urine     Status: None   Collection Time: 02/21/24  7:09 PM  Result Value Ref Range   Creatinine, Urine 140 mg/dL   Total Protein, Urine 11 mg/dL   Protein Creatinine Ratio 0.08 0.00 - 0.15 mg/mg[Cre]  CBC     Status: Abnormal   Collection Time: 02/21/24  7:44 PM  Result Value Ref Range   WBC 11.3 (H) 4.0 - 10.5 K/uL   RBC 4.20 3.87 - 5.11 MIL/uL    Hemoglobin 11.8 (L) 12.0 - 15.0 g/dL   HCT 64.7 (L) 63.9 - 53.9 %   MCV 83.8 80.0 - 100.0 fL   MCH 28.1 26.0 - 34.0 pg   MCHC 33.5 30.0 - 36.0 g/dL   RDW 86.6 88.4 - 84.4 %   Platelets 229 150 - 400 K/uL   nRBC 0.0 0.0 - 0.2 %  Comprehensive metabolic panel     Status: Abnormal   Collection Time: 02/21/24  7:44 PM  Result Value Ref Range   Sodium 136 135 - 145 mmol/L   Potassium 3.4 (L) 3.5 - 5.1 mmol/L   Chloride 105 98 - 111 mmol/L   CO2 19 (L) 22 - 32 mmol/L   Glucose, Bld 79 70 - 99 mg/dL   BUN 5 (L) 6 - 20 mg/dL   Creatinine, Ser 9.42 0.44 - 1.00 mg/dL   Calcium 8.5 (L) 8.9 - 10.3 mg/dL   Total Protein 6.7 6.5 - 8.1 g/dL   Albumin 2.7 (L) 3.5 - 5.0 g/dL   AST 19 15 - 41 U/L   ALT 14 0 - 44 U/L   Alkaline Phosphatase 71 38 - 126 U/L   Total Bilirubin 0.4 0.0 - 1.2 mg/dL   GFR, Estimated >39 >39 mL/min   Anion gap 12 5 - 15   - PreE labs normal  - While awaiting for pain improvement, patient called out and reported that her headache was improving but not gone. She also states that she had to leave to get her kids.  - plan for discharge   1. Supervision of high risk pregnancy, antepartum   2. Intractable headache, unspecified chronicity pattern, unspecified headache type   3. [redacted] weeks gestation of pregnancy   4. Chronic hypertension    - Reviewed worsening signs and return precautions,  - Recommended to take Labetalol  300mg  BID daily due to HA caused by procardia  and to stop Procardia .  - Message sent to office for BP check in 1 week after change in medication  - FHT appropriate for gestational age at time of discharge.  - patient discharged home in stable condition and may return to MAU as needed   Cliford Sequeira Erven) Emilio, MSN, CNM  [facility]  02/21/2024 11:15 PM

## 2024-02-21 NOTE — MAU Note (Signed)
 Brittany [name] is a 43 y.o. at [redacted]w[redacted]d here in MAU reporting: HA and Nausea started 0130, Tylenol  1000 mg 1630- no relief Bp taken at home 160/100- Procardia  at home   NO c/o SROM, vaginal bleeding, contractions, chest pain, or visual disturbances.  +FM   LMP:  Onset of complaint: 0130 Pain score: 8/10 Vitals:   02/21/24 1828  Pulse: 81  Resp: 18  Temp: 99.1 F (37.3 C)  SpO2: 100%     FHT: 154  Lab orders placed from triage: ua

## 2024-02-21 NOTE — MAU Note (Signed)
 Pt still reporting HA, but stating she needs to leave so she can pick up her kids. CANDIE Cedar, CNM made aware.

## 2024-02-22 ENCOUNTER — Encounter: Payer: Self-pay | Admitting: Family Medicine

## 2024-02-22 ENCOUNTER — Ambulatory Visit: Admitting: Family Medicine

## 2024-02-22 ENCOUNTER — Other Ambulatory Visit: Payer: Self-pay | Admitting: *Deleted

## 2024-02-22 ENCOUNTER — Ambulatory Visit: Payer: PRIVATE HEALTH INSURANCE | Attending: Obstetrics and Gynecology | Admitting: Maternal & Fetal Medicine

## 2024-02-22 ENCOUNTER — Ambulatory Visit: Payer: PRIVATE HEALTH INSURANCE

## 2024-02-22 ENCOUNTER — Other Ambulatory Visit: Payer: PRIVATE HEALTH INSURANCE

## 2024-02-22 VITALS — BP 134/67 | HR 78

## 2024-02-22 VITALS — BP 131/81 | HR 73 | Wt 177.0 lb

## 2024-02-22 DIAGNOSIS — O09522 Supervision of elderly multigravida, second trimester: Secondary | ICD-10-CM

## 2024-02-22 DIAGNOSIS — O10919 Unspecified pre-existing hypertension complicating pregnancy, unspecified trimester: Secondary | ICD-10-CM

## 2024-02-22 DIAGNOSIS — O0990 Supervision of high risk pregnancy, unspecified, unspecified trimester: Secondary | ICD-10-CM

## 2024-02-22 DIAGNOSIS — O10012 Pre-existing essential hypertension complicating pregnancy, second trimester: Secondary | ICD-10-CM | POA: Diagnosis present

## 2024-02-22 DIAGNOSIS — Z23 Encounter for immunization: Secondary | ICD-10-CM | POA: Diagnosis not present

## 2024-02-22 DIAGNOSIS — O09299 Supervision of pregnancy with other poor reproductive or obstetric history, unspecified trimester: Secondary | ICD-10-CM

## 2024-02-22 DIAGNOSIS — Z3A27 27 weeks gestation of pregnancy: Secondary | ICD-10-CM | POA: Insufficient documentation

## 2024-02-22 DIAGNOSIS — O09292 Supervision of pregnancy with other poor reproductive or obstetric history, second trimester: Secondary | ICD-10-CM | POA: Insufficient documentation

## 2024-02-22 DIAGNOSIS — Z362 Encounter for other antenatal screening follow-up: Secondary | ICD-10-CM | POA: Insufficient documentation

## 2024-02-22 DIAGNOSIS — O09212 Supervision of pregnancy with history of pre-term labor, second trimester: Secondary | ICD-10-CM | POA: Insufficient documentation

## 2024-02-22 DIAGNOSIS — O0942 Supervision of pregnancy with grand multiparity, second trimester: Secondary | ICD-10-CM | POA: Insufficient documentation

## 2024-02-22 MED ORDER — POTASSIUM CHLORIDE CRYS ER 20 MEQ PO TBCR
20.0000 meq | EXTENDED_RELEASE_TABLET | Freq: Every day | ORAL | 1 refills | Status: DC
Start: 2024-02-22 — End: 2024-03-07

## 2024-02-22 NOTE — Progress Notes (Signed)
 After review, MFM consult with provider is not indicated for today  William Glenn, DO 02/22/2024 3:43 PM  [facility]

## 2024-02-22 NOTE — Progress Notes (Signed)
   PRENATAL VISIT NOTE  Subjective:  Brittany [name] is a 43 y.o. [id number] at [redacted]w[redacted]d being seen today for ongoing prenatal care.  She is currently monitored for the following issues for this high-risk pregnancy and has Preexisting hypertension complicating pregnancy, antepartum; Vitamin D  deficiency; History of severe preeclampsia in prior pregnancies; AMA (advanced maternal age) multigravida 107+; and Supervision of high risk pregnancy, antepartum on their problem list.  Patient reports headache.  Contractions: Irregular. Vag. Bleeding: None.  Movement: Present. Denies leaking of fluid.   The following portions of the patient's history were reviewed and updated as appropriate: allergies, current medications, past family history, past medical history, past social history, past surgical history and problem list.   Objective:    Vitals:   02/22/24 0820  BP: 131/81  Pulse: 73  Weight: 177 lb (80.3 kg)    Fetal Status:  Fetal Heart Rate (bpm): 140-142 Fundal Height: 27 cm Movement: Present    General: Alert, oriented and cooperative. Patient is in no acute distress.  Skin: Skin is warm and dry. No rash noted.   Cardiovascular: Normal heart rate noted  Respiratory: Normal respiratory effort, no problems with respiration noted  Abdomen: Soft, gravid, appropriate for gestational age.  Pain/Pressure: Present     Pelvic: Cervical exam deferred        Extremities: Normal range of motion.  Edema: None  Mental Status: Normal mood and affect. Normal behavior. Normal judgment and thought content.   Assessment and Plan:  Pregnancy: [id number] at [redacted]w[redacted]d 1. Supervision of high risk pregnancy, antepartum (Primary) Continue prenatal care. 28 week labs today TDaP today  2. Preexisting hypertension complicating pregnancy, antepartum EFW 17% On Procardia  With chronic h/a, common during her pregnancies. Not clear if it is medication related. Had it with both labetalol  and procardia . Looking back  similar to previous pregnancies. Attempt preventive with Vit D, mag, K repletion. Stay hydrated, sleep well. Has excedrine migraine, reglan , fioricet  or flexeril  for breakthrough. May need delivery based on h/a. Attempt to et to better GA. Labs WNL yesterday at MAU. Will continue Procardia  for now.  3. Multigravida of advanced maternal age in second trimester LR NIPT  4. [redacted] weeks gestation of pregnancy   Preterm labor symptoms and general obstetric precautions including but not limited to vaginal bleeding, contractions, leaking of fluid and fetal movement were reviewed in detail with the patient. Please refer to After Visit Summary for other counseling recommendations.   Return in 2 weeks (on 03/07/2024).  Future Appointments  Date Time Provider Department Center  02/22/2024  2:45 PM [facility] PROVIDER 1 [facility]  02/22/2024  3:00 PM [facility] US2 [facility]  03/07/2024 10:55 AM Constant, Winton, MD [facility]  03/20/2024  1:30 PM Izell Harari, MD [facility]  03/21/2024 11:00 AM Tobb, Dub, DO [facility]  04/03/2024  1:50 PM Izell Harari, MD [facility]    Glenys GORMAN Birk, MD

## 2024-02-22 NOTE — Progress Notes (Signed)
 ROB: Headaches/ BP issues, went to hospital yesterday

## 2024-02-23 ENCOUNTER — Ambulatory Visit: Payer: Self-pay | Admitting: Family Medicine

## 2024-02-23 DIAGNOSIS — O24419 Gestational diabetes mellitus in pregnancy, unspecified control: Secondary | ICD-10-CM | POA: Insufficient documentation

## 2024-02-23 DIAGNOSIS — O24410 Gestational diabetes mellitus in pregnancy, diet controlled: Secondary | ICD-10-CM

## 2024-02-23 DIAGNOSIS — O0990 Supervision of high risk pregnancy, unspecified, unspecified trimester: Secondary | ICD-10-CM

## 2024-02-23 HISTORY — DX: Gestational diabetes mellitus in pregnancy, unspecified control: O24.419

## 2024-02-23 LAB — CBC
Hematocrit: 36.9 % (ref 34.0–46.6)
Hemoglobin: 11.8 g/dL (ref 11.1–15.9)
MCH: 28.3 pg (ref 26.6–33.0)
MCHC: 32 g/dL (ref 31.5–35.7)
MCV: 89 fL (ref 79–97)
Platelets: 243 x10E3/uL (ref 150–450)
RBC: 4.17 x10E6/uL (ref 3.77–5.28)
RDW: 13.2 % (ref 11.7–15.4)
WBC: 11 x10E3/uL — ABNORMAL HIGH (ref 3.4–10.8)

## 2024-02-23 LAB — HIV ANTIBODY (ROUTINE TESTING W REFLEX): HIV Screen 4th Generation wRfx: NONREACTIVE

## 2024-02-23 LAB — GLUCOSE TOLERANCE, 2 HOURS W/ 1HR
Glucose, 1 hour: 185 mg/dL — ABNORMAL HIGH (ref 70–179)
Glucose, 2 hour: 135 mg/dL (ref 70–152)
Glucose, Fasting: 81 mg/dL (ref 70–91)

## 2024-02-23 LAB — RPR: RPR Ser Ql: NONREACTIVE

## 2024-02-26 ENCOUNTER — Other Ambulatory Visit: Payer: Self-pay | Admitting: *Deleted

## 2024-02-26 DIAGNOSIS — O24410 Gestational diabetes mellitus in pregnancy, diet controlled: Secondary | ICD-10-CM

## 2024-02-26 DIAGNOSIS — O0990 Supervision of high risk pregnancy, unspecified, unspecified trimester: Secondary | ICD-10-CM

## 2024-02-26 MED ORDER — ACCU-CHEK SOFTCLIX LANCETS MISC: 1.0000 | Freq: Four times a day (QID) | 12 refills | Status: AC

## 2024-02-26 MED ORDER — ACCU-CHEK GUIDE W/DEVICE KIT: 1.0000 | PACK | Freq: Four times a day (QID) | 0 refills | Status: AC

## 2024-02-26 MED ORDER — GLUCOSE BLOOD VI STRP: 1.0000 | ORAL_STRIP | Freq: Four times a day (QID) | 12 refills | Status: AC

## 2024-02-28 ENCOUNTER — Other Ambulatory Visit

## 2024-02-28 ENCOUNTER — Encounter: Payer: Self-pay | Admitting: Family Medicine

## 2024-02-28 DIAGNOSIS — O10919 Unspecified pre-existing hypertension complicating pregnancy, unspecified trimester: Secondary | ICD-10-CM

## 2024-02-28 NOTE — Progress Notes (Unsigned)
 I connected with  Brittany [name] on 03/07/2024 by a video enabled telemedicine application and verified that I am speaking with the correct person using two identifiers. Dietitian conducting appointment in office with patient stating their location of parked car at home.  I discussed the limitations of evaluation and management by telemedicine. The patient expressed understanding and requested to reschedule.   Patient was seen for Gestational Diabetes on 03/07/2024  Start time 0900 and End time 0922   Estimated due date: 05/17/2024; [redacted]w[redacted]d  Clinical: Medications:  Current Outpatient Medications:    Accu-Chek Softclix Lancets lancets, 1 each by Other route 4 (four) times daily., Disp: 100 each, Rfl: 12   acetaminophen  (TYLENOL ) 500 MG tablet, Take 2 tablets (1,000 mg total) by mouth every 6 (six) hours as needed., Disp: 100 tablet, Rfl: 2   aspirin  EC 81 MG tablet, Take 2 tablets (162 mg total) by mouth at bedtime. Start taking when you are [redacted] weeks pregnant for rest of pregnancy for prevention of preeclampsia (Patient not taking: Reported on 01/11/2024), Disp: 300 tablet, Rfl: 2   Blood Glucose Monitoring Suppl (ACCU-CHEK GUIDE) w/Device KIT, 1 Device by Does not apply route 4 (four) times daily., Disp: 1 kit, Rfl: 0   Butalbital -APAP-Caffeine  50-325-40 MG capsule, Take 1 capsule by mouth once as needed for up to 1 dose for headache. Refractory headahce (Patient not taking: Reported on 02/21/2024), Disp: 1 capsule, Rfl: 0   cyclobenzaprine  (FLEXERIL ) 10 MG tablet, Take 1 tablet (10 mg total) by mouth 2 (two) times daily as needed. (Patient not taking: Reported on 12/21/2023), Disp: 20 tablet, Rfl: 0   glucose blood test strip, 1 each by Other route in the morning, at noon, in the evening, and at bedtime. Use as instructed, Disp: 100 each, Rfl: 12   labetalol  (NORMODYNE ) 300 MG tablet, Take 1 tablet (300 mg total) by mouth 2 (two) times daily., Disp: 60 tablet, Rfl: 2   Magnesium  Bisglycinate (MAG  GLYCINATE) 100 MG TABS, Take 1 tablet by mouth daily at 6 (six) AM. (Patient not taking: Reported on 02/21/2024), Disp: 30 tablet, Rfl: 1   metoCLOPramide  (REGLAN ) 10 MG tablet, Take 1 tablet (10 mg total) by mouth 4 (four) times daily as needed for nausea or vomiting (headache). (Patient not taking: Reported on 02/21/2024), Disp: 30 tablet, Rfl: 0   NIFEdipine  (PROCARDIA -XL/NIFEDICAL-XL) 30 MG 24 hr tablet, Take 1 tablet (30 mg total) by mouth daily., Disp: 30 tablet, Rfl: 2   potassium chloride  SA (KLOR-CON  M) 20 MEQ tablet, Take 1 tablet (20 mEq total) by mouth daily., Disp: 30 tablet, Rfl: 1   Prenatal Vit-Fe Phos-FA-Omega (VITAFOL  GUMMIES) 3.33-0.333-34.8 MG CHEW, Chew 1 tablet by mouth daily., Disp: 90 tablet, Rfl: 5   simethicone  (MYLICON) 80 MG chewable tablet, Chew 1 tablet (80 mg total) by mouth every 6 (six) hours as needed. (Patient not taking: Reported on 02/21/2024), Disp: 30 tablet, Rfl: 0  Medical History:  Past Medical History:  Diagnosis Date   Anemia 11/30/2016   Cervical dysplasia    Had LEEP   Gallstone    Heart murmur    Hypertension    Migraine 03/18/2016   Needs referral to headache specialist   Murmur, cardiac 11/30/2016   Cardiology evaluation: normal 12/2017   Severe preeclampsia    Subclinical hyperthyroidism 12/09/2015   UTI (urinary tract infection)     Labs: OGTT fasting 81, 1 hour 185, 2 hour 135 on 02/22/2024 Lab Results  Component Value Date   HGBA1C 5.5  11/09/2023   Dietary and Lifestyle History: Pt presents today alone over video. Pt request rescheduling her appointment stating she needs to drive to her OB appointment. Pt reports she is monitoring her blood sugar 1-2 times daily. Pt reports she forgot to test her blood sugar this morning and states rushed. Pt states yesterday she tested twice and reports before breakfast blood sugar reading obtained of 90 something and in the afternoon blood sugar reading obtained of 110 mg/dL. Pt reports working full  time and states the testing schedule feels impossible to her at this moment. RD encouraged to discuss with her OB and share with her employer the accomodation she is requesting during work hours to meet the goal of QID testing. Pt states she has no other questions today.   Physical Activity: not reported Stress: not reported Sleep: not reported  24 hr Recall:  First Meal:  not reported Snack:  not reported Second meal:  not reported Snack:  not reported Third meal:  not reported Snack:  not reported Beverages:  not reported  NUTRITION INTERVENTION  Nutrition education (E-1) on the following topics:   Initial Follow-up  [x]  []  Definition of Gestational Diabetes [x]  []  Why dietary management is important in controlling blood glucose []  []  Effects each nutrient has on blood glucose levels []  []  Simple carbohydrates vs complex carbohydrates [x]  []  Fluid intake [x]  []  Creating a balanced meal plan []  []  Carbohydrate counting  [x]  []  When to check blood glucose levels []  []  Proper blood glucose monitoring techniques [x]  []  Effect of stress and stress reduction techniques  [x]  []  Exercise effect on blood glucose levels, appropriate exercise during pregnancy []  []  Importance of limiting caffeine  and abstaining from alcohol and smoking [x]  []  Medications used for blood sugar control during pregnancy []  []  Hypoglycemia and rule of 15 []  []  Postpartum self care  Patient has a meter prior to visit. Patient is instructed to begin testing pre breakfast and 2 hours after each meal.   Patient instructed to monitor glucose levels: QID FBS: 60 - <= 95 mg/dL; 2 hour: <= 879 mg/dL  Patient received handouts:RD sent via mychart messaging Nutrition Diabetes and Pregnancy Carbohydrate Counting List Blood glucose log Snack ideas for diabetes during pregnancy Plate Planner  Patient will be seen for follow-up: 03/21/2024

## 2024-02-28 NOTE — Progress Notes (Signed)
 OB pt here for [facility] labs.   c/o: HA's no visual changes, no dizziness, mild swelling in feet . Pt offered Rx management for HA's pt states Fioricet  did not give relief in the past.  B/P stable today. Told pt we will await [facility] lab results.pt understandable.

## 2024-02-29 ENCOUNTER — Ambulatory Visit: Payer: Self-pay | Admitting: Family Medicine

## 2024-02-29 ENCOUNTER — Encounter: Payer: Self-pay | Admitting: Family Medicine

## 2024-02-29 LAB — COMPREHENSIVE METABOLIC PANEL WITH GFR
ALT: 20 IU/L (ref 0–32)
AST: 31 IU/L (ref 0–40)
Albumin: 3.6 g/dL — ABNORMAL LOW (ref 3.9–4.9)
Alkaline Phosphatase: 92 IU/L (ref 44–121)
BUN/Creatinine Ratio: 13 (ref 9–23)
BUN: 9 mg/dL (ref 6–24)
Bilirubin Total: 0.2 mg/dL (ref 0.0–1.2)
CO2: 21 mmol/L (ref 20–29)
Calcium: 9 mg/dL (ref 8.7–10.2)
Chloride: 100 mmol/L (ref 96–106)
Creatinine, Ser: 0.67 mg/dL (ref 0.57–1.00)
Globulin, Total: 3.1 g/dL (ref 1.5–4.5)
Glucose: 88 mg/dL (ref 70–99)
Potassium: 3.7 mmol/L (ref 3.5–5.2)
Sodium: 135 mmol/L (ref 134–144)
Total Protein: 6.7 g/dL (ref 6.0–8.5)
eGFR: 113 mL/min/1.73 (ref >59)

## 2024-02-29 LAB — PROTEIN / CREATININE RATIO, URINE
Creatinine, Urine: 257.8 mg/dL
Protein, Ur: 47.9 mg/dL
Protein/Creat Ratio: 186 mg/g{creat} (ref 0–200)

## 2024-02-29 LAB — CBC
Hematocrit: 34.1 % (ref 34.0–46.6)
Hemoglobin: 11.3 g/dL (ref 11.1–15.9)
MCH: 28.8 pg (ref 26.6–33.0)
MCHC: 33.1 g/dL (ref 31.5–35.7)
MCV: 87 fL (ref 79–97)
Platelets: 229 x10E3/uL (ref 150–450)
RBC: 3.92 x10E6/uL (ref 3.77–5.28)
RDW: 13 % (ref 11.7–15.4)
WBC: 10.4 x10E3/uL (ref 3.4–10.8)

## 2024-03-07 ENCOUNTER — Encounter: Payer: Self-pay | Admitting: Obstetrics and Gynecology

## 2024-03-07 ENCOUNTER — Telehealth (INDEPENDENT_AMBULATORY_CARE_PROVIDER_SITE_OTHER): Payer: PRIVATE HEALTH INSURANCE | Admitting: Dietician

## 2024-03-07 ENCOUNTER — Other Ambulatory Visit: Payer: Self-pay

## 2024-03-07 ENCOUNTER — Inpatient Hospital Stay (HOSPITAL_COMMUNITY)
Admission: AD | Admit: 2024-03-07 | Discharge: 2024-03-07 | Disposition: A | Discharge status: Home / Self Care | Attending: Obstetrics & Gynecology | Admitting: Obstetrics & Gynecology

## 2024-03-07 ENCOUNTER — Encounter: Attending: Family Medicine | Admitting: Dietician

## 2024-03-07 ENCOUNTER — Encounter (HOSPITAL_COMMUNITY): Payer: Self-pay | Admitting: Obstetrics & Gynecology

## 2024-03-07 ENCOUNTER — Ambulatory Visit (INDEPENDENT_AMBULATORY_CARE_PROVIDER_SITE_OTHER): Payer: PRIVATE HEALTH INSURANCE | Admitting: Obstetrics and Gynecology

## 2024-03-07 VITALS — BP 166/101 | HR 97 | Wt 174.0 lb

## 2024-03-07 DIAGNOSIS — O24410 Gestational diabetes mellitus in pregnancy, diet controlled: Secondary | ICD-10-CM

## 2024-03-07 DIAGNOSIS — O0990 Supervision of high risk pregnancy, unspecified, unspecified trimester: Secondary | ICD-10-CM

## 2024-03-07 DIAGNOSIS — Z3A29 29 weeks gestation of pregnancy: Secondary | ICD-10-CM

## 2024-03-07 DIAGNOSIS — O10919 Unspecified pre-existing hypertension complicating pregnancy, unspecified trimester: Secondary | ICD-10-CM

## 2024-03-07 DIAGNOSIS — O26893 Other specified pregnancy related conditions, third trimester: Secondary | ICD-10-CM | POA: Diagnosis not present

## 2024-03-07 DIAGNOSIS — Z79899 Other long term (current) drug therapy: Secondary | ICD-10-CM | POA: Insufficient documentation

## 2024-03-07 DIAGNOSIS — O0993 Supervision of high risk pregnancy, unspecified, third trimester: Secondary | ICD-10-CM

## 2024-03-07 DIAGNOSIS — O10913 Unspecified pre-existing hypertension complicating pregnancy, third trimester: Secondary | ICD-10-CM

## 2024-03-07 DIAGNOSIS — O09523 Supervision of elderly multigravida, third trimester: Secondary | ICD-10-CM | POA: Insufficient documentation

## 2024-03-07 DIAGNOSIS — Z7982 Long term (current) use of aspirin: Secondary | ICD-10-CM | POA: Diagnosis not present

## 2024-03-07 DIAGNOSIS — O09299 Supervision of pregnancy with other poor reproductive or obstetric history, unspecified trimester: Secondary | ICD-10-CM

## 2024-03-07 DIAGNOSIS — Z713 Dietary counseling and surveillance: Secondary | ICD-10-CM | POA: Insufficient documentation

## 2024-03-07 DIAGNOSIS — Z3689 Encounter for other specified antenatal screening: Secondary | ICD-10-CM

## 2024-03-07 DIAGNOSIS — O113 Pre-existing hypertension with pre-eclampsia, third trimester: Secondary | ICD-10-CM | POA: Insufficient documentation

## 2024-03-07 DIAGNOSIS — R519 Headache, unspecified: Secondary | ICD-10-CM

## 2024-03-07 LAB — PROTEIN / CREATININE RATIO, URINE
Creatinine, Urine: 45 mg/dL
Protein Creatinine Ratio: 0.18 mg/mg{creat} — ABNORMAL HIGH (ref 0.00–0.15)
Total Protein, Urine: 8 mg/dL

## 2024-03-07 LAB — COMPREHENSIVE METABOLIC PANEL WITH GFR
ALT: 20 U/L (ref 0–44)
AST: 25 U/L (ref 15–41)
Albumin: 2.7 g/dL — ABNORMAL LOW (ref 3.5–5.0)
Alkaline Phosphatase: 71 U/L (ref 38–126)
Anion gap: 10 (ref 5–15)
BUN: 5 mg/dL — ABNORMAL LOW (ref 6–20)
CO2: 21 mmol/L — ABNORMAL LOW (ref 22–32)
Calcium: 8.7 mg/dL — ABNORMAL LOW (ref 8.9–10.3)
Chloride: 105 mmol/L (ref 98–111)
Creatinine, Ser: 0.54 mg/dL (ref 0.44–1.00)
GFR, Estimated: >60 mL/min (ref >60)
Glucose, Bld: 84 mg/dL (ref 70–99)
Potassium: 3.3 mmol/L — ABNORMAL LOW (ref 3.5–5.1)
Sodium: 136 mmol/L (ref 135–145)
Total Bilirubin: 0.4 mg/dL (ref 0.0–1.2)
Total Protein: 6.9 g/dL (ref 6.5–8.1)

## 2024-03-07 LAB — CBC
HCT: 33.7 % — ABNORMAL LOW (ref 36.0–46.0)
Hemoglobin: 11.3 g/dL — ABNORMAL LOW (ref 12.0–15.0)
MCH: 28.4 pg (ref 26.0–34.0)
MCHC: 33.5 g/dL (ref 30.0–36.0)
MCV: 84.7 fL (ref 80.0–100.0)
Platelets: 239 K/uL (ref 150–400)
RBC: 3.98 MIL/uL (ref 3.87–5.11)
RDW: 13.3 % (ref 11.5–15.5)
WBC: 9.2 K/uL (ref 4.0–10.5)
nRBC: 0 % (ref 0.0–0.2)

## 2024-03-07 MED ORDER — ACETAMINOPHEN-CAFFEINE 500-65 MG PO TABS
2.0000 | ORAL_TABLET | Freq: Once | ORAL | Status: AC
  Administered 2024-03-07: 2 via ORAL
  Filled 2024-03-07: qty 2

## 2024-03-07 MED ORDER — LABETALOL HCL 400 MG PO TABS: 400.0000 mg | ORAL_TABLET | Freq: Two times a day (BID) | ORAL | 0 refills | Status: DC

## 2024-03-07 MED ORDER — METOCLOPRAMIDE HCL 5 MG/ML IJ SOLN
10.0000 mg | Freq: Once | INTRAMUSCULAR | Status: AC
  Administered 2024-03-07: 10 mg via INTRAVENOUS
  Filled 2024-03-07: qty 2

## 2024-03-07 MED ORDER — EXCEDRIN TENSION HEADACHE 500-65 MG PO TABS: 2.0000 | ORAL_TABLET | Freq: Three times a day (TID) | ORAL | 0 refills | Status: AC | PRN

## 2024-03-07 MED ORDER — POTASSIUM CHLORIDE CRYS ER 20 MEQ PO TBCR
20.0000 meq | EXTENDED_RELEASE_TABLET | Freq: Once | ORAL | Status: AC
  Administered 2024-03-07: 20 meq via ORAL
  Filled 2024-03-07: qty 1

## 2024-03-07 MED ORDER — DIPHENHYDRAMINE HCL 50 MG/ML IJ SOLN
25.0000 mg | Freq: Once | INTRAMUSCULAR | Status: AC
  Administered 2024-03-07: 25 mg via INTRAVENOUS
  Filled 2024-03-07: qty 1

## 2024-03-07 MED ORDER — LACTATED RINGERS IV BOLUS
1000.0000 mL | Freq: Once | INTRAVENOUS | Status: AC
  Administered 2024-03-07: 1000 mL via INTRAVENOUS

## 2024-03-07 MED ORDER — DIPHENHYDRAMINE HCL 25 MG PO CAPS: 25.0000 mg | ORAL_CAPSULE | Freq: Four times a day (QID) | ORAL | 0 refills | Status: AC | PRN

## 2024-03-07 MED ORDER — METOCLOPRAMIDE HCL 10 MG PO TABS: 10.0000 mg | ORAL_TABLET | Freq: Four times a day (QID) | ORAL | 0 refills | Status: DC | PRN

## 2024-03-07 MED ORDER — LACTATED RINGERS IV SOLN: INTRAVENOUS | Status: DC

## 2024-03-07 NOTE — MAU Note (Signed)
 Brittany [name] is a 43 y.o. at [redacted]w[redacted]d here in MAU reporting: sent from office for high BP.  Pt states woke up with headache this morning, but didn't take any meds for headache.  I started seeing spots around 1130 today.  No vag bleeding, no leaking of fluid, decreased fetal movement.   Pain score:Headache 6                   Lower Abd pain 5 Vitals:   03/07/24 1238 03/07/24 1239  BP:  (!) 144/93  Pulse:  91  Resp:  18  Temp:  98.3 F (36.8 C)  SpO2: 99%

## 2024-03-07 NOTE — Progress Notes (Signed)
   PRENATAL VISIT NOTE  Subjective:  Brittany [name] is a 43 y.o. [id number] at [redacted]w[redacted]d being seen today for ongoing prenatal care.  She is currently monitored for the following issues for this high-risk pregnancy and has Preexisting hypertension complicating pregnancy, antepartum; Vitamin D  deficiency; History of severe preeclampsia in prior pregnancies; AMA (advanced maternal age) multigravida 28+; Supervision of high risk pregnancy, antepartum; and Gestational diabetes on their problem list.  Patient reports a mild headache.  Contractions: Irritability. Vag. Bleeding: None.  Movement: Present. Denies leaking of fluid.   The following portions of the patient's history were reviewed and updated as appropriate: allergies, current medications, past family history, past medical history, past social history, past surgical history and problem list.   Objective:    Vitals:   03/07/24 1048  BP: (!) 163/103  Pulse: 88  Weight: 174 lb (78.9 kg)    Fetal Status:  Fetal Heart Rate (bpm): 146 Fundal Height: 29 cm Movement: Present    General: Alert, oriented and cooperative. Patient is in no acute distress.  Skin: Skin is warm and dry. No rash noted.   Cardiovascular: Normal heart rate noted  Respiratory: Normal respiratory effort, no problems with respiration noted  Abdomen: Soft, gravid, appropriate for gestational age.  Pain/Pressure: Present     Pelvic: Cervical exam deferred        Extremities: Normal range of motion.  Edema: None  Mental Status: Normal mood and affect. Normal behavior. Normal judgment and thought content.   Assessment and Plan:  Pregnancy: [id number] at [redacted]w[redacted]d 1. Supervision of high risk pregnancy, antepartum (Primary) Patient plans Nexplanon  for contraception  2. Preexisting hypertension complicating pregnancy, antepartum 3. History of severe preeclampsia in prior pregnancies Elevated BP today s/p procardia  at 8 am this morning Patient reports a mild headache EFW  16%tile on 8/7 Repeat BP 166/101 Patient advised to present to MAU for evaluation  4. Diet controlled gestational diabetes mellitus (GDM) in third trimester CBGs reported as highest fasting 90 and highest pp 110 Will continue with diet control for now  5. Multigravida of advanced maternal age in third trimester Lr NIPS  Preterm labor symptoms and general obstetric precautions including but not limited to vaginal bleeding, contractions, leaking of fluid and fetal movement were reviewed in detail with the patient. Please refer to After Visit Summary for other counseling recommendations.   Return in about 2 weeks (around 03/21/2024) for in person, ROB, High risk.  Future Appointments  Date Time Provider Department Center  03/20/2024  1:30 PM Izell Harari, MD [facility]  03/21/2024 11:00 AM Tobb, Kardie, DO [facility]  03/21/2024  2:00 PM [facility] PROVIDER 1 [facility]  03/21/2024  2:15 PM [facility] US4 [facility]  03/21/2024  4:15 PM [facility]EDUCATION [facility]  03/28/2024  1:10 PM [facility] NST [facility]  04/03/2024  1:10 PM [facility] NST [facility]  04/03/2024  1:50 PM Izell Harari, MD [facility]  04/05/2024 10:15 AM Teague Gretta Darice BRAVO, PA-C [facility]  04/11/2024  1:10 PM [facility] NST [facility]  04/18/2024  2:00 PM [facility] PROVIDER 1 [facility]  04/18/2024  2:15 PM [facility] US5 [facility]  04/25/2024  1:10 PM [facility] NST [facility]  05/02/2024  1:10 PM [facility] NST [facility]    Winton Felt, MD

## 2024-03-07 NOTE — MAU Provider Note (Cosign Needed Addendum)
 Chief Complaint:  Hypertension   HPI   Event Date/Time   First Provider Initiated Contact with Patient 03/07/24 1258      Brittany [name] is a 43 y.o. [id number] at [redacted]w[redacted]d who presents to maternity admissions reporting she was sent from primary OB office for high blood pressures this morning.  163/103.  Patient is a known pre-existing chronic hypertensive with a history of severe preeclampsia in prior pregnancies.  She is currently taking labetalol  300 twice daily for management of her blood pressure and last dose was taken this morning at 0800.  Today she complains of a severe headache as well rating it 6 out of 10 on a pain scale and she did not take any medication for resolve.  She also is reporting some mild visual changes with occasional spots that is new onset today.  She denies any shortness of breath, chest pain, right upper quadrant pain, nausea vomiting.  She denies any obstetrical complaints.  No vaginal bleeding, leaking of fluid, contractions and reports good fetal movement  Pregnancy Course: Femina  Past Medical History:  Diagnosis Date   Anemia 11/30/2016   Cervical dysplasia    Had LEEP   Gallstone    Heart murmur    Hypertension    Migraine 03/18/2016   Needs referral to headache specialist   Murmur, cardiac 11/30/2016   Cardiology evaluation: normal 12/2017   Severe preeclampsia    Subclinical hyperthyroidism 12/09/2015   UTI (urinary tract infection)    OB History  Gravida Para Term Preterm AB Living  11 5 2 3 5 5   SAB IAB Ectopic Multiple Live Births  4 1 0 0 5    # Outcome Date GA Lbr Len/2nd Weight Sex Type Anes PTL Lv  11 Current           10 Preterm 12/13/22 [redacted]w[redacted]d  2041 g F Vag-Spont EPI  LIV     Birth Comments: Induced for severe preeclampsia     Complications: Severe preeclampsia  9 Preterm 06/13/18 [redacted]w[redacted]d 07:04 / 00:15 2300 g F Vag-Spont EPI  LIV     Birth Comments: Induced for severe preeclampsia     Complications: Severe preeclampsia  8 Term  05/03/16 [redacted]w[redacted]d 00:08 / 00:01 2175 g M Vag-Spont None  LIV     Birth Comments: Mild preeclampsia     Complications: Preeclampsia  7 Preterm 04/06/11 [redacted]w[redacted]d 08:25 / 00:08 2455 g F Vag-Spont None  LIV     Birth Comments: Mild preeclampsia     Complications: Preeclampsia  6 Term 10/21/05 [redacted]w[redacted]d   M Vag-Spont   LIV     Complications: Failure to Progress in Second Stage  5 IAB           4 SAB           3 SAB           2 SAB           1 SAB            Past Surgical History:  Procedure Laterality Date   DILATION AND CURETTAGE OF UTERUS     LEEP     Family History  Problem Relation Age of Onset   Asthma Mother    Hypertension Mother    CAD Mother    Asthma Maternal Grandmother    Hypertension Maternal Grandmother    Social History   Tobacco Use   Smoking status: Never   Smokeless tobacco: Never  Vaping Use   Vaping  status: Never Used  Substance Use Topics   Alcohol use: Not Currently   Drug use: No   No Known Allergies Medications Prior to Admission  Medication Sig Dispense Refill Last Dose/Taking   acetaminophen  (TYLENOL ) 500 MG tablet Take 2 tablets (1,000 mg total) by mouth every 6 (six) hours as needed. 100 tablet 2 Past Week   cyclobenzaprine  (FLEXERIL ) 10 MG tablet Take 1 tablet (10 mg total) by mouth 2 (two) times daily as needed. 20 tablet 0 Past Week   NIFEdipine  (PROCARDIA -XL/NIFEDICAL-XL) 30 MG 24 hr tablet Take 1 tablet (30 mg total) by mouth daily. 30 tablet 2 03/07/2024 at  8:00 AM   Prenatal Vit-Fe Phos-FA-Omega (VITAFOL  GUMMIES) 3.33-0.333-34.8 MG CHEW Chew 1 tablet by mouth daily. 90 tablet 5 03/06/2024   Accu-Chek Softclix Lancets lancets 1 each by Other route 4 (four) times daily. 100 each 12    aspirin  EC 81 MG tablet Take 2 tablets (162 mg total) by mouth at bedtime. Start taking when you are [redacted] weeks pregnant for rest of pregnancy for prevention of preeclampsia (Patient not taking: Reported on 01/11/2024) 300 tablet 2    Blood Glucose Monitoring Suppl (ACCU-CHEK  GUIDE) w/Device KIT 1 Device by Does not apply route 4 (four) times daily. 1 kit 0    Butalbital -APAP-Caffeine  50-325-40 MG capsule Take 1 capsule by mouth once as needed for up to 1 dose for headache. Refractory headahce (Patient not taking: Reported on 03/07/2024) 1 capsule 0    glucose blood test strip 1 each by Other route in the morning, at noon, in the evening, and at bedtime. Use as instructed 100 each 12    Magnesium  Bisglycinate (MAG GLYCINATE) 100 MG TABS Take 1 tablet by mouth daily at 6 (six) AM. (Patient not taking: Reported on 02/21/2024) 30 tablet 1    potassium chloride  SA (KLOR-CON  M) 20 MEQ tablet Take 1 tablet (20 mEq total) by mouth daily. (Patient not taking: Reported on 03/07/2024) 30 tablet 1    simethicone  (MYLICON) 80 MG chewable tablet Chew 1 tablet (80 mg total) by mouth every 6 (six) hours as needed. (Patient not taking: Reported on 02/21/2024) 30 tablet 0    [DISCONTINUED] labetalol  (NORMODYNE ) 300 MG tablet Take 1 tablet (300 mg total) by mouth 2 (two) times daily. 60 tablet 2 More than a month   [DISCONTINUED] metoCLOPramide  (REGLAN ) 10 MG tablet Take 1 tablet (10 mg total) by mouth 4 (four) times daily as needed for nausea or vomiting (headache). (Patient not taking: Reported on 02/21/2024) 30 tablet 0     I have reviewed patient's Past Medical Hx, Surgical Hx, Family Hx, Social Hx, medications and allergies.   ROS  Pertinent items noted in HPI and remainder of comprehensive ROS otherwise negative.   PHYSICAL EXAM  Patient Vitals for the past 24 hrs:  BP Temp Temp src Pulse Resp SpO2 Height Weight  03/07/24 1440 119/64 -- -- 73 -- -- -- --  03/07/24 1401 132/85 -- -- 83 -- -- -- --  03/07/24 1400 -- -- -- -- -- 98 % -- --  03/07/24 1355 -- -- -- -- -- 98 % -- --  03/07/24 1350 -- -- -- -- -- 98 % -- --  03/07/24 1346 132/79 -- -- 71 -- -- -- --  03/07/24 1345 -- -- -- -- -- 98 % -- --  03/07/24 1340 -- -- -- -- -- 99 % -- --  03/07/24 1335 -- -- -- -- -- 99 % -- --  03/07/24 1331 (!) 141/87 -- -- 72 -- -- -- --  03/07/24 1330 -- -- -- -- -- 100 % -- --  03/07/24 1325 -- -- -- -- -- 99 % -- --  03/07/24 1320 -- -- -- -- -- 98 % -- --  03/07/24 1316 121/88 -- -- 81 -- -- -- --  03/07/24 1315 -- -- -- -- -- 99 % -- --  03/07/24 1310 -- -- -- -- -- 98 % -- --  03/07/24 1305 -- -- -- -- -- 98 % -- --  03/07/24 1301 126/87 -- -- 78 -- -- -- --  03/07/24 1300 -- -- -- -- -- 99 % -- --  03/07/24 1255 -- -- -- -- -- 98 % -- --  03/07/24 1250 -- -- -- -- -- 99 % -- --  03/07/24 1246 137/87 -- -- 89 -- 99 % -- --  03/07/24 1245 -- -- -- -- -- 99 % -- --  03/07/24 1240 -- -- -- -- -- 98 % -- --  03/07/24 1239 (!) 144/93 98.3 F (36.8 C) Oral 91 18 -- -- --  03/07/24 1238 -- -- -- -- -- 99 % 5' 5 (1.651 m) 79.4 kg    Constitutional: Well-developed, well-nourished female in no acute distress.  Cardiovascular: normal rate & rhythm, warm and well-perfused Respiratory: normal effort, no problems with respiration noted, Lungs BCTA GI: Abd soft, non-tender, gravid, no ruq pain illicited MS: Extremities nontender, no edema, normal ROM Neurologic: Alert and oriented x 4.  GU: no CVA tenderness  Fetal Tracing: Baseline: 140 Variability: moderate Accelerations: present Decelerations: absent Toco: quiet   Labs: Results for orders placed or performed during the hospital encounter of 03/07/24 (from the past 24 hours)  Protein / creatinine ratio, urine     Status: Abnormal   Collection Time: 03/07/24 12:56 PM  Result Value Ref Range   Creatinine, Urine 45 mg/dL   Total Protein, Urine 8 mg/dL   Protein Creatinine Ratio 0.18 (H) 0.00 - 0.15 mg/mg[Cre]  CBC     Status: Abnormal   Collection Time: 03/07/24  1:10 PM  Result Value Ref Range   WBC 9.2 4.0 - 10.5 K/uL   RBC 3.98 3.87 - 5.11 MIL/uL   Hemoglobin 11.3 (L) 12.0 - 15.0 g/dL   HCT 66.2 (L) 63.9 - 53.9 %   MCV 84.7 80.0 - 100.0 fL   MCH 28.4 26.0 - 34.0 pg   MCHC 33.5 30.0 - 36.0 g/dL   RDW 86.6  88.4 - 84.4 %   Platelets 239 150 - 400 K/uL   nRBC 0.0 0.0 - 0.2 %  Comprehensive metabolic panel     Status: Abnormal   Collection Time: 03/07/24  1:10 PM  Result Value Ref Range   Sodium 136 135 - 145 mmol/L   Potassium 3.3 (L) 3.5 - 5.1 mmol/L   Chloride 105 98 - 111 mmol/L   CO2 21 (L) 22 - 32 mmol/L   Glucose, Bld 84 70 - 99 mg/dL   BUN 5 (L) 6 - 20 mg/dL   Creatinine, Ser 9.45 0.44 - 1.00 mg/dL   Calcium 8.7 (L) 8.9 - 10.3 mg/dL   Total Protein 6.9 6.5 - 8.1 g/dL   Albumin 2.7 (L) 3.5 - 5.0 g/dL   AST 25 15 - 41 U/L   ALT 20 0 - 44 U/L   Alkaline Phosphatase 71 38 - 126 U/L   Total Bilirubin 0.4 0.0 - 1.2 mg/dL   GFR, Estimated >39 >  60 mL/min   Anion gap 10 5 - 15    Imaging:  No results found.  MDM & MAU COURSE  MDM:  HIGH: concern for SIPE vs uncontrolled cHTN. Obtained labs and provided treatment for symptoms as below.  MAU Course: Orders Placed This Encounter  Procedures   CBC   Comprehensive metabolic panel   Protein / creatinine ratio, urine   Discharge patient   Meds ordered this encounter  Medications   metoCLOPramide  (REGLAN ) injection 10 mg   diphenhydrAMINE  (BENADRYL ) injection 25 mg   acetaminophen -caffeine  (EXCEDRIN  TENSION HEADACHE) 500-65 MG per tablet 2 tablet   DISCONTD: lactated ringers  infusion   lactated ringers  bolus 1,000 mL   potassium chloride  SA (KLOR-CON  M) CR tablet 20 mEq   labetalol  400 MG TABS    Sig: Take 400 mg by mouth 2 (two) times daily.    Dispense:  180 tablet    Refill:  0   metoCLOPramide  (REGLAN ) 10 MG tablet    Sig: Take 1 tablet (10 mg total) by mouth 4 (four) times daily as needed (headache).    Dispense:  30 tablet    Refill:  0   diphenhydrAMINE  (BENADRYL ) 25 mg capsule    Sig: Take 1 capsule (25 mg total) by mouth every 6 (six) hours as needed (Headache).    Dispense:  30 capsule    Refill:  0   acetaminophen -caffeine  (EXCEDRIN  TENSION HEADACHE) 500-65 MG TABS per tablet    Sig: Take 2 tablets by mouth  every 8 (eight) hours as needed (Headache).    Dispense:  60 tablet    Refill:  0   - Patient status report given to Dr Nicholaus @ 1311. FREDRIK Dalton, NP  - 1418: Reassessed patient at the bedside. Headache now completely resolved and no longer having vision changes. Has had normotensive to mild range pressures in MAU. Labs also returned reassuring against SIPE. Discussed with patient increasing her current antihypertensives. She understandably does not think she would be able to take labetalol  TID. Also had headaches with Procardia . Gave patient option to increase labetalol  vs adding amlodipine . Will increase labetalol  to 400 mg TID at this time. Prescribed headache medications as well. Close return precautions given.  ASSESSMENT   1. Preexisting hypertension complicating pregnancy, antepartum   2. Pregnancy headache in third trimester   3. NST (non-stress test) reactive   4. [redacted] weeks gestation of pregnancy      PLAN   #cHTN: Increase labetalol  to 400 mg BID. SIPE ruled-out #HA: prescribed excedrin  tension, benadryl /reglan  #FWB: Reassuring NST and +FM  Discharged home in stable condition.  Almarie CHRISTELLA Nicholaus, MD

## 2024-03-07 NOTE — Progress Notes (Addendum)
 ROB   Elevated B/P today notes mild HA,no swelling took B/P Rx at 8am next due tonight  Did not check sugars today notes lowest fasting was 90 ..highest 110 after meal Pt has log.

## 2024-03-07 NOTE — Discharge Instructions (Signed)
 For headaches: - Stay well-hydrated - Try tylenol  or excedrin  tension (this is tylenol  plus caffeine ) first - Can also take benadryl  + reglan  as well for severe headache - If headache does not resolve with above, need to return to MAU promptly

## 2024-03-09 ENCOUNTER — Encounter (HOSPITAL_COMMUNITY): Payer: Self-pay | Admitting: Obstetrics and Gynecology

## 2024-03-09 ENCOUNTER — Inpatient Hospital Stay (HOSPITAL_COMMUNITY)

## 2024-03-09 ENCOUNTER — Inpatient Hospital Stay (HOSPITAL_COMMUNITY)
Admission: AD | Admit: 2024-03-09 | Discharge: 2024-03-09 | Disposition: A | Discharge status: Home / Self Care | Payer: Self-pay | Attending: Obstetrics and Gynecology | Admitting: Obstetrics and Gynecology

## 2024-03-09 DIAGNOSIS — O23593 Infection of other part of genital tract in pregnancy, third trimester: Secondary | ICD-10-CM | POA: Insufficient documentation

## 2024-03-09 DIAGNOSIS — O24419 Gestational diabetes mellitus in pregnancy, unspecified control: Secondary | ICD-10-CM | POA: Diagnosis not present

## 2024-03-09 DIAGNOSIS — R109 Unspecified abdominal pain: Secondary | ICD-10-CM

## 2024-03-09 DIAGNOSIS — O26899 Other specified pregnancy related conditions, unspecified trimester: Secondary | ICD-10-CM

## 2024-03-09 DIAGNOSIS — O26893 Other specified pregnancy related conditions, third trimester: Secondary | ICD-10-CM | POA: Diagnosis not present

## 2024-03-09 DIAGNOSIS — R0682 Tachypnea, not elsewhere classified: Secondary | ICD-10-CM | POA: Diagnosis not present

## 2024-03-09 DIAGNOSIS — O10013 Pre-existing essential hypertension complicating pregnancy, third trimester: Secondary | ICD-10-CM | POA: Diagnosis not present

## 2024-03-09 DIAGNOSIS — O09293 Supervision of pregnancy with other poor reproductive or obstetric history, third trimester: Secondary | ICD-10-CM | POA: Diagnosis not present

## 2024-03-09 DIAGNOSIS — Z3A30 30 weeks gestation of pregnancy: Secondary | ICD-10-CM | POA: Diagnosis not present

## 2024-03-09 DIAGNOSIS — O2623 Pregnancy care for patient with recurrent pregnancy loss, third trimester: Secondary | ICD-10-CM | POA: Diagnosis not present

## 2024-03-09 DIAGNOSIS — O10919 Unspecified pre-existing hypertension complicating pregnancy, unspecified trimester: Secondary | ICD-10-CM

## 2024-03-09 DIAGNOSIS — O09523 Supervision of elderly multigravida, third trimester: Secondary | ICD-10-CM | POA: Diagnosis not present

## 2024-03-09 DIAGNOSIS — O4703 False labor before 37 completed weeks of gestation, third trimester: Secondary | ICD-10-CM | POA: Diagnosis present

## 2024-03-09 DIAGNOSIS — O09299 Supervision of pregnancy with other poor reproductive or obstetric history, unspecified trimester: Secondary | ICD-10-CM

## 2024-03-09 LAB — CBC
HCT: 33.5 % — ABNORMAL LOW (ref 36.0–46.0)
Hemoglobin: 11 g/dL — ABNORMAL LOW (ref 12.0–15.0)
MCH: 27.6 pg (ref 26.0–34.0)
MCHC: 32.8 g/dL (ref 30.0–36.0)
MCV: 84.2 fL (ref 80.0–100.0)
Platelets: 247 K/uL (ref 150–400)
RBC: 3.98 MIL/uL (ref 3.87–5.11)
RDW: 13.2 % (ref 11.5–15.5)
WBC: 9.8 K/uL (ref 4.0–10.5)
nRBC: 0 % (ref 0.0–0.2)

## 2024-03-09 LAB — COMPREHENSIVE METABOLIC PANEL WITH GFR
ALT: 18 U/L (ref 0–44)
AST: 26 U/L (ref 15–41)
Albumin: 2.7 g/dL — ABNORMAL LOW (ref 3.5–5.0)
Alkaline Phosphatase: 69 U/L (ref 38–126)
Anion gap: 6 (ref 5–15)
BUN: 5 mg/dL — ABNORMAL LOW (ref 6–20)
CO2: 22 mmol/L (ref 22–32)
Calcium: 8.7 mg/dL — ABNORMAL LOW (ref 8.9–10.3)
Chloride: 106 mmol/L (ref 98–111)
Creatinine, Ser: 0.58 mg/dL (ref 0.44–1.00)
GFR, Estimated: >60 mL/min (ref >60)
Glucose, Bld: 98 mg/dL (ref 70–99)
Potassium: 3.5 mmol/L (ref 3.5–5.1)
Sodium: 134 mmol/L — ABNORMAL LOW (ref 135–145)
Total Bilirubin: 0.4 mg/dL (ref 0.0–1.2)
Total Protein: 6.8 g/dL (ref 6.5–8.1)

## 2024-03-09 LAB — URINALYSIS, ROUTINE W REFLEX MICROSCOPIC
Bacteria, UA: NONE SEEN
Bilirubin Urine: NEGATIVE
Glucose, UA: NEGATIVE mg/dL
Hgb urine dipstick: NEGATIVE
Ketones, ur: NEGATIVE mg/dL
Nitrite: NEGATIVE
Protein, ur: NEGATIVE mg/dL
Specific Gravity, Urine: 1.005 (ref 1.005–1.030)
pH: 7 (ref 5.0–8.0)

## 2024-03-09 LAB — WET PREP, GENITAL
Sperm: NONE SEEN
Trich, Wet Prep: NONE SEEN
WBC, Wet Prep HPF POC: >=10 — AB (ref <10)
Yeast Wet Prep HPF POC: NONE SEEN

## 2024-03-09 LAB — PROTEIN / CREATININE RATIO, URINE
Creatinine, Urine: 38 mg/dL
Total Protein, Urine: <6 mg/dL

## 2024-03-09 LAB — D-DIMER, QUANTITATIVE: D-Dimer, Quant: 1.05 ug{FEU}/mL — ABNORMAL HIGH (ref 0.00–0.50)

## 2024-03-09 LAB — BRAIN NATRIURETIC PEPTIDE: B Natriuretic Peptide: 17.2 pg/mL (ref 0.0–100.0)

## 2024-03-09 LAB — LIPASE, BLOOD: Lipase: 37 U/L (ref 11–51)

## 2024-03-09 LAB — FETAL FIBRONECTIN: Fetal Fibronectin: NEGATIVE

## 2024-03-09 MED ORDER — ASPIRIN 81 MG PO TBEC: 162.0000 mg | DELAYED_RELEASE_TABLET | Freq: Every day | ORAL | 2 refills | Status: AC

## 2024-03-09 MED ORDER — HYDRALAZINE HCL 20 MG/ML IJ SOLN: 10.0000 mg | INTRAMUSCULAR | Status: DC | PRN

## 2024-03-09 MED ORDER — CYCLOBENZAPRINE HCL 10 MG PO TABS: 10.0000 mg | ORAL_TABLET | Freq: Two times a day (BID) | ORAL | 3 refills | Status: AC | PRN

## 2024-03-09 MED ORDER — LABETALOL HCL 5 MG/ML IV SOLN: 40.0000 mg | INTRAVENOUS | Status: DC | PRN

## 2024-03-09 MED ORDER — IOHEXOL 350 MG/ML SOLN
75.0000 mL | Freq: Once | INTRAVENOUS | Status: AC | PRN
  Administered 2024-03-09: 75 mL via INTRAVENOUS

## 2024-03-09 MED ORDER — LABETALOL HCL 300 MG PO TABS: 600.0000 mg | ORAL_TABLET | Freq: Three times a day (TID) | ORAL | 5 refills | Status: DC

## 2024-03-09 MED ORDER — METRONIDAZOLE 500 MG PO TABS: 500.0000 mg | ORAL_TABLET | Freq: Two times a day (BID) | ORAL | 0 refills | Status: AC

## 2024-03-09 MED ORDER — LACTATED RINGERS IV BOLUS
1000.0000 mL | Freq: Once | INTRAVENOUS | Status: AC
  Administered 2024-03-09: 1000 mL via INTRAVENOUS

## 2024-03-09 MED ORDER — LABETALOL HCL 5 MG/ML IV SOLN: 20.0000 mg | INTRAVENOUS | Status: DC | PRN

## 2024-03-09 MED ORDER — ACETAMINOPHEN 500 MG PO TABS
1000.0000 mg | ORAL_TABLET | Freq: Once | ORAL | Status: AC
  Administered 2024-03-09: 1000 mg via ORAL
  Filled 2024-03-09: qty 2

## 2024-03-09 MED ORDER — HYDRALAZINE HCL 20 MG/ML IJ SOLN: 5.0000 mg | INTRAMUSCULAR | Status: DC | PRN

## 2024-03-09 NOTE — MAU Note (Signed)
 Brittany [name] is a 43 y.o. at [redacted]w[redacted]d here in MAU reporting: cramping started about an hour ago, doesn't know if braxton hicks or contractions, but it is painful.  Hasn't timed them. No bleeding or LOF.  Reports +FM.  Onset of complaint: 1130 Pain score: 8 Vitals:   03/09/24 1242  BP: (!) 145/88  Pulse: 84  Resp: 18  Temp: 98.6 F (37 C)  SpO2: 99%     FHT:140 Lab orders placed from triage:  urine

## 2024-03-09 NOTE — MAU Provider Note (Signed)
 Chief Complaint:  Abdominal Pain   HPI   None     Brittany [name] is a 43 y.o. [id number] at [redacted]w[redacted]d who presents to maternity admissions reporting abdominal pain. She describes abdominal cramps that started about 1 hour ago. She wonders if they are [facility] or contractions, rates them 8/10. She has not times how long between. She did not try anything at home for pain. Denies nausea, vomiting, constipation, diarrhea, fever, chills, chest pain. Denies vaginal bleeding, leaking of fluid. Endorses good fetal movement. She took her labetalol  at 0900 today. She was seen in the MAU on 8/21 for elevated blood pressures, headache, and vision changes. Workup reassuring, labetalol  was increased to 400 mg BID.  Pregnancy Course: Receives care at [facility] for [facility] . Prenatal records reviewed. Pregnancy complicated by cHTN, gDM, AMA, history of preeclampsia.  Past Medical History:  Diagnosis Date   Anemia 11/30/2016   Cervical dysplasia    Had LEEP   Gallstone    Heart murmur    Hypertension    Migraine 03/18/2016   Needs referral to headache specialist   Murmur, cardiac 11/30/2016   Cardiology evaluation: normal 12/2017   Severe preeclampsia    Subclinical hyperthyroidism 12/09/2015   UTI (urinary tract infection)    OB History  Gravida Para Term Preterm AB Living  11 5 2 3 5 5   SAB IAB Ectopic Multiple Live Births  4 1 0 0 5    # Outcome Date GA Lbr Len/2nd Weight Sex Type Anes PTL Lv  11 Current           10 Preterm 12/13/22 [redacted]w[redacted]d  2041 g F Vag-Spont EPI  LIV     Birth Comments: Induced for severe preeclampsia     Complications: Severe preeclampsia  9 Preterm 06/13/18 [redacted]w[redacted]d 07:04 / 00:15 2300 g F Vag-Spont EPI  LIV     Birth Comments: Induced for severe preeclampsia     Complications: Severe preeclampsia  8 Term 05/03/16 [redacted]w[redacted]d 00:08 / 00:01 2175 g M Vag-Spont None  LIV     Birth Comments: Mild preeclampsia     Complications: Preeclampsia  7 Preterm 04/06/11  [redacted]w[redacted]d 08:25 / 00:08 2455 g F Vag-Spont None  LIV     Birth Comments: Mild preeclampsia     Complications: Preeclampsia  6 Term 10/21/05 [redacted]w[redacted]d   M Vag-Spont   LIV     Complications: Failure to Progress in Second Stage  5 IAB           4 SAB           3 SAB           2 SAB           1 SAB            Past Surgical History:  Procedure Laterality Date   DILATION AND CURETTAGE OF UTERUS     LEEP     Family History  Problem Relation Age of Onset   Asthma Mother    Hypertension Mother    CAD Mother    Asthma Maternal Grandmother    Hypertension Maternal Grandmother    Social History   Tobacco Use   Smoking status: Never   Smokeless tobacco: Never  Vaping Use   Vaping status: Never Used  Substance Use Topics   Alcohol use: Not Currently   Drug use: No   No Known Allergies Medications Prior to Admission  Medication Sig Dispense Refill Last Dose/Taking  cyclobenzaprine  (FLEXERIL ) 10 MG tablet Take 1 tablet (10 mg total) by mouth 2 (two) times daily as needed. 20 tablet 0 Past Month   labetalol  400 MG TABS Take 400 mg by mouth 2 (two) times daily. 180 tablet 0 03/09/2024   metoCLOPramide  (REGLAN ) 10 MG tablet Take 1 tablet (10 mg total) by mouth 4 (four) times daily as needed (headache). 30 tablet 0 Past Week   Prenatal Vit-Fe Phos-FA-Omega (VITAFOL  GUMMIES) 3.33-0.333-34.8 MG CHEW Chew 1 tablet by mouth daily. 90 tablet 5 03/09/2024   Accu-Chek Softclix Lancets lancets 1 each by Other route 4 (four) times daily. 100 each 12    acetaminophen  (TYLENOL ) 500 MG tablet Take 2 tablets (1,000 mg total) by mouth every 6 (six) hours as needed. 100 tablet 2    acetaminophen -caffeine  (EXCEDRIN  TENSION HEADACHE) 500-65 MG TABS per tablet Take 2 tablets by mouth every 8 (eight) hours as needed (Headache). 60 tablet 0    Blood Glucose Monitoring Suppl (ACCU-CHEK GUIDE) w/Device KIT 1 Device by Does not apply route 4 (four) times daily. 1 kit 0    diphenhydrAMINE  (BENADRYL ) 25 mg capsule Take 1  capsule (25 mg total) by mouth every 6 (six) hours as needed (Headache). 30 capsule 0    glucose blood test strip 1 each by Other route in the morning, at noon, in the evening, and at bedtime. Use as instructed 100 each 12    [DISCONTINUED] aspirin  EC 81 MG tablet Take 2 tablets (162 mg total) by mouth at bedtime. Start taking when you are [redacted] weeks pregnant for rest of pregnancy for prevention of preeclampsia (Patient not taking: Reported on 01/11/2024) 300 tablet 2     I have reviewed patient's Past Medical Hx, Surgical Hx, Family Hx, Social Hx, medications and allergies.   ROS  Pertinent items noted in HPI and remainder of comprehensive ROS otherwise negative.   PHYSICAL EXAM  Patient Vitals for the past 24 hrs:  BP Temp Temp src Pulse Resp SpO2 Height Weight  03/09/24 1747 (!) 142/82 -- -- 70 -- -- -- --  03/09/24 1735 -- -- -- -- -- 99 % -- --  03/09/24 1731 (!) 143/83 -- -- 71 -- -- -- --  03/09/24 1716 (!) 147/90 -- -- 76 -- -- -- --  03/09/24 1715 -- -- -- -- -- 100 % -- --  03/09/24 1701 135/76 -- -- 82 -- -- -- --  03/09/24 1700 -- -- -- -- -- 98 % -- --  03/09/24 1655 -- -- -- -- -- 99 % -- --  03/09/24 1650 -- -- -- -- -- 98 % -- --  03/09/24 1646 128/63 -- -- 81 -- -- -- --  03/09/24 1631 (!) 147/94 -- -- 80 -- -- -- --  03/09/24 1630 -- -- -- -- -- 98 % -- --  03/09/24 1625 -- -- -- -- -- 98 % -- --  03/09/24 1620 -- -- -- -- -- 100 % -- --  03/09/24 1616 (!) 157/91 -- -- 79 -- -- -- --  03/09/24 1601 (!) 143/90 -- -- 80 -- -- -- --  03/09/24 1558 (!) 156/88 -- -- 77 -- -- -- --  03/09/24 1516 126/73 -- -- 84 -- -- -- --  03/09/24 1515 -- -- -- -- -- 100 % -- --  03/09/24 1510 -- -- -- -- -- 100 % -- --  03/09/24 1505 -- -- -- -- -- 100 % -- --  03/09/24 1501 118/65 -- -- 81 -- -- -- --  03/09/24 1450 -- -- -- -- -- 98 % -- --  03/09/24 1446 132/74 -- -- 80 -- -- -- --  03/09/24 1445 -- -- -- -- -- 98 % -- --  03/09/24 1431 (!) 153/99 -- -- 84 -- -- -- --  03/09/24  1416 (!) 146/92 -- -- 80 -- -- -- --  03/09/24 1405 -- -- -- -- -- 98 % -- --  03/09/24 1401 (!) 144/101 -- -- 81 -- -- -- --  03/09/24 1346 (!) 138/91 -- -- 90 -- -- -- --  03/09/24 1331 (!) 140/89 -- -- 91 -- -- -- --  03/09/24 1316 (!) 145/89 -- -- 84 -- -- -- --  03/09/24 1305 -- -- -- -- -- 98 % -- --  03/09/24 1300 -- -- -- -- -- 99 % -- --  03/09/24 1257 (!) 152/95 -- -- 84 (!) 34 -- -- --  03/09/24 1255 -- -- -- -- -- 100 % -- --  03/09/24 1242 (!) 145/88 98.6 F (37 C) Oral 84 18 99 % 5' 5 (1.651 m) 78.7 kg    Constitutional: Well-developed, well-nourished female.  HEENT: atraumatic, normocephalic. Neck has normal ROM. EOM intact. Cardiovascular: regular rate and rhythm. No murmurs, rubs, or gallops. Warm and well-perfused Respiratory: increased effort, tachypnea. No adventitious breath sounds on exam.  GI: Abd soft, non-tender, non-distended but gravid. MSK: Extremities nontender, no edema, normal ROM Skin: warm and dry. Acyanotic, no jaundice or pallor. Neurologic: Alert and oriented x 4. No abnormal coordination. Psychiatric: Normal mood. Speech not slurred, not rapid/pressured. Patient is cooperative. GU: no CVA tenderness Pelvic: exam performed with chaperone present: Kaylan Cowher RN. Cervical Exam Dilation: Fingertip Effacement (%): 50 Cervical Position: Posterior Station: Ballotable Exam by:: Joesph Sear PA-C  Fetal Tracing: Baseline FHR: 135 per minute Fetal heart variability: moderate Fetal Heart Rate accelerations: yes Fetal Heart Rate decelerations: none Fetal Non-stress Test: Category I (reactive) Toco: uterine irritability   Labs: Results for orders placed or performed during the hospital encounter of 03/09/24 (from the past 24 hours)  Urinalysis, Routine w reflex microscopic -Urine, Clean Catch     Status: Abnormal   Collection Time: 03/09/24 12:46 PM  Result Value Ref Range   Color, Urine STRAW (A) YELLOW   APPearance HAZY (A) CLEAR    Specific Gravity, Urine 1.005 1.005 - 1.030   pH 7.0 5.0 - 8.0   Glucose, UA NEGATIVE NEGATIVE mg/dL   Hgb urine dipstick NEGATIVE NEGATIVE   Bilirubin Urine NEGATIVE NEGATIVE   Ketones, ur NEGATIVE NEGATIVE mg/dL   Protein, ur NEGATIVE NEGATIVE mg/dL   Nitrite NEGATIVE NEGATIVE   Leukocytes,Ua SMALL (A) NEGATIVE   RBC / HPF 0-5 0 - 5 RBC/hpf   WBC, UA 0-5 0 - 5 WBC/hpf   Bacteria, UA NONE SEEN NONE SEEN   Squamous Epithelial / HPF 6-10 0 - 5 /HPF   Mucus PRESENT   Protein / creatinine ratio, urine     Status: None   Collection Time: 03/09/24  1:00 PM  Result Value Ref Range   Creatinine, Urine 38 mg/dL   Total Protein, Urine <6 mg/dL   Protein Creatinine Ratio        0.00 - 0.15 mg/mg[Cre]  CBC     Status: Abnormal   Collection Time: 03/09/24  1:15 PM  Result Value Ref Range   WBC 9.8 4.0 - 10.5 K/uL   RBC 3.98 3.87 - 5.11 MIL/uL   Hemoglobin 11.0 (L) 12.0 - 15.0 g/dL  HCT 33.5 (L) 36.0 - 46.0 %   MCV 84.2 80.0 - 100.0 fL   MCH 27.6 26.0 - 34.0 pg   MCHC 32.8 30.0 - 36.0 g/dL   RDW 86.7 88.4 - 84.4 %   Platelets 247 150 - 400 K/uL   nRBC 0.0 0.0 - 0.2 %  Comprehensive metabolic panel with GFR     Status: Abnormal   Collection Time: 03/09/24  1:15 PM  Result Value Ref Range   Sodium 134 (L) 135 - 145 mmol/L   Potassium 3.5 3.5 - 5.1 mmol/L   Chloride 106 98 - 111 mmol/L   CO2 22 22 - 32 mmol/L   Glucose, Bld 98 70 - 99 mg/dL   BUN 5 (L) 6 - 20 mg/dL   Creatinine, Ser 9.41 0.44 - 1.00 mg/dL   Calcium 8.7 (L) 8.9 - 10.3 mg/dL   Total Protein 6.8 6.5 - 8.1 g/dL   Albumin 2.7 (L) 3.5 - 5.0 g/dL   AST 26 15 - 41 U/L   ALT 18 0 - 44 U/L   Alkaline Phosphatase 69 38 - 126 U/L   Total Bilirubin 0.4 0.0 - 1.2 mg/dL   GFR, Estimated >39 >39 mL/min   Anion gap 6 5 - 15  Lipase, blood     Status: None   Collection Time: 03/09/24  1:15 PM  Result Value Ref Range   Lipase 37 11 - 51 U/L  Brain natriuretic peptide     Status: None   Collection Time: 03/09/24  1:15 PM   Result Value Ref Range   B Natriuretic Peptide 17.2 0.0 - 100.0 pg/mL  D-dimer, quantitative     Status: Abnormal   Collection Time: 03/09/24  1:15 PM  Result Value Ref Range   D-Dimer, Quant 1.05 (H) 0.00 - 0.50 ug/mL-FEU  Wet prep, genital     Status: Abnormal   Collection Time: 03/09/24  3:55 PM   Specimen: Vaginal  Result Value Ref Range   Yeast Wet Prep HPF POC NONE SEEN NONE SEEN   Trich, Wet Prep NONE SEEN NONE SEEN   Clue Cells Wet Prep HPF POC PRESENT (A) NONE SEEN   WBC, Wet Prep HPF POC >=10 (A) <10   Sperm NONE SEEN   Fetal fibronectin     Status: None   Collection Time: 03/09/24  4:08 PM  Result Value Ref Range   Fetal Fibronectin NEGATIVE NEGATIVE    Imaging:  CT Angio Chest Pulmonary Embolism (PE) W or WO Contrast Result Date: 03/09/2024 CLINICAL DATA:  Concern for pulmonary edema. EXAM: CT ANGIOGRAPHY CHEST WITH CONTRAST TECHNIQUE: Multidetector CT imaging of the chest was performed using the standard protocol during bolus administration of intravenous contrast. Multiplanar CT image reconstructions and MIPs were obtained to evaluate the vascular anatomy. RADIATION DOSE REDUCTION: This exam was performed according to the departmental dose-optimization program which includes automated exposure control, adjustment of the mA and/or kV according to patient size and/or use of iterative reconstruction technique. CONTRAST:  75mL OMNIPAQUE  IOHEXOL  350 MG/ML SOLN COMPARISON:  None Available. FINDINGS: Cardiovascular: There is no cardiomegaly or pericardial effusion. The thoracic aorta is unremarkable. No pulmonary artery emboli identified. Mediastinum/Nodes: No hilar or mediastinal adenopathy. The esophagus is grossly unremarkable. No mediastinal fluid collection. Lungs/Pleura: No lobar consolidation, pleural effusion, pneumothorax. The central airways are patent. Upper Abdomen: No acute abnormality. Musculoskeletal: No acute osseous pathology. Review of the MIP images confirms the  above findings. IMPRESSION: No acute intrathoracic pathology. No CT evidence of pulmonary  artery embolus. Electronically Signed   By: Vanetta Chou M.D.   On: 03/09/2024 16:31    EKG: EKG: unchanged from previous tracings, normal sinus rhythm. I personally reviewed for interpretation.  MDM & MAU COURSE  MDM: High  MAU Course: -Elevated blood pressure, tachypnea on RN exam in room after being triaged with RR 34. -EKG for tachypnea. -CMP, CBC, urine protein/creatinine ratio to rule out preeclampsia. CBC will also rule out anemia. CMP will rule out hepatobiliary pathology. -Lipase to rule out pancreatitis. BNP due to tachypnea/SOB. -UA and wet prep to rule out infection. -D-dimer and CXR to rule out PE and pulmonary edema. -EKG shows normal sinus rhythm, no ACS. -D-dimer positive. No PE on CTPA. -No cervical dilation on exam. Fetal fibronectin negative. -Wet prep positive for BV, treat with metronidazole . -Discussed with Dr. Cleatus, agrees with increasing labetalol  to 600 mg TID for cHTN with BP check in office on Monday.  Differential diagnosis considered for shortness of breath includes but is not limited to: physiologic SOB of pregnancy, anemia, valvular heart disease, CAD, ACS, heart failure exacerbation, pulmonary embolism  Differential diagnosis considered for lower abdominal pain includes but is not limited to: round ligament pain, UTI, cervicitis, chorioamnionitis, appendicitis, diverticulitis, constipation, nephrolithiasis, inguinal hernia, small bowel obstruction   Orders Placed This Encounter  Procedures   Wet prep, genital   CT Angio Chest Pulmonary Embolism (PE) W or WO Contrast   Urinalysis, Routine w reflex microscopic -Urine, Clean Catch   CBC   Comprehensive metabolic panel with GFR   Lipase, blood   Protein / creatinine ratio, urine   Brain natriuretic peptide   D-dimer, quantitative   Fetal fibronectin   Vital signs   Notify physician (specify) Confirmatory  reading of BP> 160/110 15 minutes later   Apply Hypertensive Disorders of Pregnancy Care Plan   Measure blood pressure   ED EKG   EKG 12-Lead   Insert peripheral IV   Seizure precautions   Meds ordered this encounter  Medications   AND Linked Order Group    hydrALAZINE  (APRESOLINE ) injection 5 mg    hydrALAZINE  (APRESOLINE ) injection 10 mg    labetalol  (NORMODYNE ) injection 20 mg    labetalol  (NORMODYNE ) injection 40 mg   acetaminophen  (TYLENOL ) tablet 1,000 mg   lactated ringers  bolus 1,000 mL   iohexol  (OMNIPAQUE ) 350 MG/ML injection 75 mL   aspirin  EC 81 MG tablet    Sig: Take 2 tablets (162 mg total) by mouth at bedtime. Start taking when you are [redacted] weeks pregnant for rest of pregnancy for prevention of preeclampsia    Dispense:  300 tablet    Refill:  2   labetalol  (NORMODYNE ) 300 MG tablet    Sig: Take 2 tablets (600 mg total) by mouth 3 (three) times daily.    Dispense:  90 tablet    Refill:  5   metroNIDAZOLE  (FLAGYL ) 500 MG tablet    Sig: Take 1 tablet (500 mg total) by mouth 2 (two) times daily for 7 days.    Dispense:  14 tablet    Refill:  0    ASSESSMENT   1. Abdominal cramping affecting pregnancy   2. History of severe preeclampsia in prior pregnancy   3. Tachypnea   4. [redacted] weeks gestation of pregnancy     PLAN  Discharge home in stable condition with preeclampsia precautions.  Increase blood pressure medicine to 600 mg TID. Office message sent to schedule BP check for Monday. Metronidazole  for BV.  Follow-up Information     [facility] for [facility] at [facility] Follow up in 2 day(s).   Specialty: Obstetrics and Gynecology Why: BP check in office Contact information: [address] [place]  [address] [phone]                 Allergies as of 03/09/2024   No Known Allergies      Medication List     TAKE these medications    Accu-Chek Guide w/Device Kit 1 Device by Does not apply route 4  (four) times daily.   Accu-Chek Softclix Lancets lancets 1 each by Other route 4 (four) times daily.   acetaminophen  500 MG tablet Commonly known as: TYLENOL  Take 2 tablets (1,000 mg total) by mouth every 6 (six) hours as needed.   aspirin  EC 81 MG tablet Take 2 tablets (162 mg total) by mouth at bedtime. Start taking when you are [redacted] weeks pregnant for rest of pregnancy for prevention of preeclampsia   cyclobenzaprine  10 MG tablet Commonly known as: FLEXERIL  Take 1 tablet (10 mg total) by mouth 2 (two) times daily as needed.   diphenhydrAMINE  25 mg capsule Commonly known as: BENADRYL  Take 1 capsule (25 mg total) by mouth every 6 (six) hours as needed (Headache).   Excedrin  Tension Headache 500-65 MG Tabs per tablet Generic drug: acetaminophen -caffeine  Take 2 tablets by mouth every 8 (eight) hours as needed (Headache).   glucose blood test strip 1 each by Other route in the morning, at noon, in the evening, and at bedtime. Use as instructed   labetalol  300 MG tablet Commonly known as: NORMODYNE  Take 2 tablets (600 mg total) by mouth 3 (three) times daily. What changed:  medication strength how much to take when to take this   metoCLOPramide  10 MG tablet Commonly known as: REGLAN  Take 1 tablet (10 mg total) by mouth 4 (four) times daily as needed (headache).   metroNIDAZOLE  500 MG tablet Commonly known as: FLAGYL  Take 1 tablet (500 mg total) by mouth 2 (two) times daily for 7 days.   Vitafol  Gummies 3.33-0.333-34.8 MG Chew Chew 1 tablet by mouth daily.        Joesph DELENA Sear, PA

## 2024-03-09 NOTE — Progress Notes (Signed)
 Pt transported to CT via wheelchair.

## 2024-03-09 NOTE — Discharge Instructions (Addendum)
 INCREASE your blood pressure medicine to labetalol  600 mg three (3) times each day. Until you pick it up, you can take your current blood pressure medicine three times daily and check your blood pressure about 1 hour after taking your medicine.  The office will call to schedule a blood pressure check on Monday.  PELVIC/ABDOMINAL PAIN: Up to 80 percent of women experience groin pain at some point during pregnancy, mostly in that final trimester when stress on the pelvic region is especially intense. Your increasingly heavy baby is burrowing deeper into your pelvis in preparation for birth, and their head is now pressing hard against your bladder, rectum, hips and pelvic bones. The result is an ever-increasing stress on the joints, muscles and organs in your pelvis and back. Soak in a warm tub (not too hot) Tylenol  1000 mg by mouth every 6-8 hrs as needed for pain Slow position changes Laying on the affected side Wear a maternity support belt during the day. Take it off while sleeping. Apply a heating pad to your lower back for 20 minutes at a time, taking at least a 20-minute break before applying again. Massage: Starting at the middle of your pubic bone, trace little circles in a wide U from your pubic bone to your hip bones on both sides.  Then starting just above your pubic bone, press in and down, alternating sides to create a gentle rocking of your uterus back and forth.  Move your hands up the sides of your belly and back down. Do this 3-5 times upon waking and before bed.  Stretches: Get on hands and knees and alternate arching your back deeply while inhaling, and then rounding your back while exhaling. Modified runners lunge:  Sit on a chair with half of your bottom on the chair and half off.  Sit up tall, plant your front foot, and stretch your other foot out behind you.  Breathe deeply for 5 breaths and then do the other side. Chiropractor or massage therapy: [facility]  in [place] specializes in pregnancy care. You can visit their website at: [email or web address] to schedule a new patient chiropractic treatment (1 hour) appointment. Please let us  know if you have any other questions or concerns.

## 2024-03-10 ENCOUNTER — Encounter: Payer: Self-pay | Admitting: Obstetrics & Gynecology

## 2024-03-11 ENCOUNTER — Encounter: Payer: Self-pay | Admitting: *Deleted

## 2024-03-11 ENCOUNTER — Ambulatory Visit

## 2024-03-11 VITALS — BP 144/91 | HR 88

## 2024-03-11 DIAGNOSIS — Z0131 Encounter for examination of blood pressure with abnormal findings: Secondary | ICD-10-CM | POA: Diagnosis not present

## 2024-03-11 DIAGNOSIS — O09299 Supervision of pregnancy with other poor reproductive or obstetric history, unspecified trimester: Secondary | ICD-10-CM

## 2024-03-11 NOTE — Progress Notes (Signed)
 Subjective:  Brittany [name] is a 43 y.o. female here for BP check.   Hypertension ROS: Patient notes headaches, visual symptoms, RUQ/epigastric pain or other concerning symptoms. Pt started new B/P Rx yesterday.(Labetalol  TID) notes nausea and diarrhea pt states sx's were onset before getting/taking any BV Rx.  Objective:  LMP 08/11/2023   Appearance alert, well appearing, and in no distress. General exam BP noted to be elevated today in office.    Assessment:   Blood Pressure needs improvement.   Plan:  Pt RTO in 2 days for repeat B/P Check  pt states she can rto on Thursday.

## 2024-03-14 ENCOUNTER — Encounter (HOSPITAL_COMMUNITY): Payer: Self-pay | Admitting: Obstetrics and Gynecology

## 2024-03-14 ENCOUNTER — Inpatient Hospital Stay (HOSPITAL_COMMUNITY)
Admission: AD | Admit: 2024-03-14 | Discharge: 2024-03-14 | Disposition: A | Discharge status: Home / Self Care | Payer: Self-pay | Attending: Obstetrics and Gynecology | Admitting: Obstetrics and Gynecology

## 2024-03-14 ENCOUNTER — Ambulatory Visit

## 2024-03-14 VITALS — BP 153/91 | HR 91

## 2024-03-14 DIAGNOSIS — O09523 Supervision of elderly multigravida, third trimester: Secondary | ICD-10-CM | POA: Insufficient documentation

## 2024-03-14 DIAGNOSIS — O10913 Unspecified pre-existing hypertension complicating pregnancy, third trimester: Secondary | ICD-10-CM

## 2024-03-14 DIAGNOSIS — O24419 Gestational diabetes mellitus in pregnancy, unspecified control: Secondary | ICD-10-CM | POA: Insufficient documentation

## 2024-03-14 DIAGNOSIS — Z3A30 30 weeks gestation of pregnancy: Secondary | ICD-10-CM

## 2024-03-14 DIAGNOSIS — Z7982 Long term (current) use of aspirin: Secondary | ICD-10-CM | POA: Diagnosis not present

## 2024-03-14 DIAGNOSIS — O10919 Unspecified pre-existing hypertension complicating pregnancy, unspecified trimester: Secondary | ICD-10-CM

## 2024-03-14 DIAGNOSIS — O09299 Supervision of pregnancy with other poor reproductive or obstetric history, unspecified trimester: Secondary | ICD-10-CM

## 2024-03-14 DIAGNOSIS — Z79899 Other long term (current) drug therapy: Secondary | ICD-10-CM | POA: Diagnosis not present

## 2024-03-14 DIAGNOSIS — O09293 Supervision of pregnancy with other poor reproductive or obstetric history, third trimester: Secondary | ICD-10-CM | POA: Diagnosis not present

## 2024-03-14 LAB — COMPREHENSIVE METABOLIC PANEL WITH GFR
ALT: 19 U/L (ref 0–44)
AST: 26 U/L (ref 15–41)
Albumin: 2.6 g/dL — ABNORMAL LOW (ref 3.5–5.0)
Alkaline Phosphatase: 71 U/L (ref 38–126)
Anion gap: 13 (ref 5–15)
BUN: <5 mg/dL — ABNORMAL LOW (ref 6–20)
CO2: 19 mmol/L — ABNORMAL LOW (ref 22–32)
Calcium: 8.7 mg/dL — ABNORMAL LOW (ref 8.9–10.3)
Chloride: 106 mmol/L (ref 98–111)
Creatinine, Ser: 0.56 mg/dL (ref 0.44–1.00)
GFR, Estimated: >60 mL/min (ref >60)
Glucose, Bld: 76 mg/dL (ref 70–99)
Potassium: 3.5 mmol/L (ref 3.5–5.1)
Sodium: 138 mmol/L (ref 135–145)
Total Bilirubin: 0.5 mg/dL (ref 0.0–1.2)
Total Protein: 6.4 g/dL — ABNORMAL LOW (ref 6.5–8.1)

## 2024-03-14 LAB — URINALYSIS, ROUTINE W REFLEX MICROSCOPIC
Bilirubin Urine: NEGATIVE
Glucose, UA: NEGATIVE mg/dL
Ketones, ur: NEGATIVE mg/dL
Nitrite: NEGATIVE
Protein, ur: NEGATIVE mg/dL
Specific Gravity, Urine: 1.006 (ref 1.005–1.030)
pH: 7 (ref 5.0–8.0)

## 2024-03-14 LAB — CBC
HCT: 32.8 % — ABNORMAL LOW (ref 36.0–46.0)
Hemoglobin: 11 g/dL — ABNORMAL LOW (ref 12.0–15.0)
MCH: 28.2 pg (ref 26.0–34.0)
MCHC: 33.5 g/dL (ref 30.0–36.0)
MCV: 84.1 fL (ref 80.0–100.0)
Platelets: 248 K/uL (ref 150–400)
RBC: 3.9 MIL/uL (ref 3.87–5.11)
RDW: 13.2 % (ref 11.5–15.5)
WBC: 9.5 K/uL (ref 4.0–10.5)
nRBC: 0 % (ref 0.0–0.2)

## 2024-03-14 LAB — PROTEIN / CREATININE RATIO, URINE
Creatinine, Urine: 43 mg/dL
Protein Creatinine Ratio: 0.21 mg/mg{creat} — ABNORMAL HIGH (ref 0.00–0.15)
Total Protein, Urine: 9 mg/dL

## 2024-03-14 MED ORDER — CAFFEINE 200 MG PO TABS
200.0000 mg | ORAL_TABLET | Freq: Once | ORAL | Status: AC
  Administered 2024-03-14: 200 mg via ORAL
  Filled 2024-03-14: qty 1

## 2024-03-14 MED ORDER — ACETAMINOPHEN 500 MG PO TABS
1000.0000 mg | ORAL_TABLET | Freq: Once | ORAL | Status: AC
  Administered 2024-03-14: 1000 mg via ORAL
  Filled 2024-03-14: qty 2

## 2024-03-14 NOTE — MAU Provider Note (Signed)
 Chief Complaint:  Abdominal Pain, Hypertension, and Headache   HPI   Brittany [name] is a 43 y.o. [id number] at [redacted]w[redacted]d who presents to maternity admissions reporting elevated blood pressures in clinic and 1 day of lower abdominal cramping.   She was seen this morning in our clinic for blood pressure check - ambulatory blood pressures were 163/95 and she was redirected to MA for further evaluation.  She also notes a light headache.  She denies any changes in vision, chest pain, shortness of breath, leg swelling.  She notes that she has had headaches throughout this pregnancy that Tylenol  have been helpful for her.  Current medications include labetalol  600 mg 3 times daily and aspirin  162 mg daily.  She last took her labetalol  600 mg this morning, and last took aspirin  162 mg 2 days ago.  She has also had 1 day of lower abdominal cramping.  She denies any radiation to the back or dysuria.  Pregnancy Course: Receives care at [facility] for [facility] health care at [facility].  Prenatal records reviewed.  Pregnancy is complicated by chronic hypertension, gestational diabetes, AMA, and history of preeclampsia in previous pregnancies.  Past Medical History:  Diagnosis Date   Anemia 11/30/2016   Cervical dysplasia    Had LEEP   Gallstone    Heart murmur    Hypertension    Migraine 03/18/2016   Needs referral to headache specialist   Murmur, cardiac 11/30/2016   Cardiology evaluation: normal 12/2017   Severe preeclampsia    Subclinical hyperthyroidism 12/09/2015   UTI (urinary tract infection)    OB History  Gravida Para Term Preterm AB Living  11 5 2 3 5 5   SAB IAB Ectopic Multiple Live Births  4 1 0 0 5    # Outcome Date GA Lbr Len/2nd Weight Sex Type Anes PTL Lv  11 Current           10 Preterm 12/13/22 [redacted]w[redacted]d  2041 g F Vag-Spont EPI  LIV     Birth Comments: Induced for severe preeclampsia     Complications: Severe preeclampsia  9 Preterm 06/13/18 [redacted]w[redacted]d 07:04 / 00:15 2300 g F Vag-Spont EPI   LIV     Birth Comments: Induced for severe preeclampsia     Complications: Severe preeclampsia  8 Term 05/03/16 [redacted]w[redacted]d 00:08 / 00:01 2175 g M Vag-Spont None  LIV     Birth Comments: Mild preeclampsia     Complications: Preeclampsia  7 Preterm 04/06/11 [redacted]w[redacted]d 08:25 / 00:08 2455 g F Vag-Spont None  LIV     Birth Comments: Mild preeclampsia     Complications: Preeclampsia  6 Term 10/21/05 [redacted]w[redacted]d   M Vag-Spont   LIV     Complications: Failure to Progress in Second Stage  5 IAB           4 SAB           3 SAB           2 SAB           1 SAB            Past Surgical History:  Procedure Laterality Date   DILATION AND CURETTAGE OF UTERUS     LEEP     Family History  Problem Relation Age of Onset   Asthma Mother    Hypertension Mother    CAD Mother    Asthma Maternal Grandmother    Hypertension Maternal Grandmother    Social History   Tobacco  Use   Smoking status: Never   Smokeless tobacco: Never  Vaping Use   Vaping status: Never Used  Substance Use Topics   Alcohol use: Not Currently   Drug use: No   No Known Allergies No medications prior to admission.    I have reviewed patient's Past Medical Hx, Surgical Hx, Family Hx, Social Hx, medications and allergies.   ROS  Pertinent items noted in HPI and remainder of comprehensive ROS otherwise negative.   PHYSICAL EXAM  Patient Vitals for the past 24 hrs:  BP Temp Temp src Pulse Resp SpO2 Height Weight  03/14/24 1501 132/86 -- -- 70 -- -- -- --  03/14/24 1500 -- -- -- -- -- 99 % -- --  03/14/24 1455 -- -- -- -- -- 98 % -- --  03/14/24 1450 -- -- -- -- -- 98 % -- --  03/14/24 1445 -- -- -- -- -- 98 % -- --  03/14/24 1440 -- -- -- -- -- 99 % -- --  03/14/24 1435 -- -- -- -- -- 98 % -- --  03/14/24 1430 (!) 141/95 -- -- 72 -- 99 % -- --  03/14/24 1425 -- -- -- -- -- 98 % -- --  03/14/24 1420 -- -- -- -- -- 99 % -- --  03/14/24 1415 -- -- -- -- -- 99 % -- --  03/14/24 1410 -- -- -- -- -- 99 % -- --  03/14/24 1405 -- --  -- -- -- 99 % -- --  03/14/24 1400 (!) 145/92 -- -- 75 -- 99 % -- --  03/14/24 1355 -- -- -- -- -- 99 % -- --  03/14/24 1350 -- -- -- -- -- 99 % -- --  03/14/24 1345 -- -- -- -- -- 99 % -- --  03/14/24 1340 -- -- -- -- -- 99 % -- --  03/14/24 1335 -- -- -- -- -- 99 % -- --  03/14/24 1330 (!) 143/91 -- -- 77 -- 99 % -- --  03/14/24 1325 -- -- -- -- -- 99 % -- --  03/14/24 1320 -- -- -- -- -- 99 % -- --  03/14/24 1316 111/70 -- -- 74 -- -- -- --  03/14/24 1315 -- -- -- -- -- 99 % -- --  03/14/24 1310 -- -- -- -- -- 99 % -- --  03/14/24 1305 -- -- -- -- -- 99 % -- --  03/14/24 1300 133/83 -- -- 74 -- 99 % -- --  03/14/24 1255 -- -- -- -- -- 99 % -- --  03/14/24 1250 -- -- -- -- -- 99 % -- --  03/14/24 1245 135/85 -- -- 81 -- 98 % -- --  03/14/24 1240 -- -- -- -- -- 99 % -- --  03/14/24 1235 -- -- -- -- -- 99 % -- --  03/14/24 1230 (!) 139/90 -- -- 85 -- 98 % -- --  03/14/24 1225 -- -- -- -- -- 98 % -- --  03/14/24 1220 -- -- -- -- -- 99 % -- --  03/14/24 1215 (!) 144/91 -- -- 78 -- 99 % -- --  03/14/24 1210 -- -- -- -- -- 99 % -- --  03/14/24 1205 -- -- -- -- -- 98 % -- --  03/14/24 1200 (!) 154/86 -- -- 93 -- 99 % -- --  03/14/24 1155 -- -- -- -- -- 99 % -- --  03/14/24 1150 -- -- -- -- -- 99 % -- --  03/14/24 1146 133/86 98.7 F (37.1 C) Oral 80 15 99 % 5' 5 (1.651 m) 79.8 kg  03/14/24 1145 -- -- -- -- -- 99 % -- --  03/14/24 1140 -- -- -- -- -- 99 % -- --    Constitutional: Well-developed, well-nourished female in no acute distress.  Cardiovascular: normal rate & rhythm, warm and well-perfused Respiratory: normal effort, no problems with respiration noted GI: Abd soft, mild tenderness to palpation in lower abdomen, non-distended MS: Extremities nontender, no edema, normal ROM Neurologic: Alert and oriented x 4.  GU: no CVA tenderness Pelvic: Cervical exam completed, internal os closed   Dilation: Closed (internal closed) Exam by:: Tarvares Lant,MD  Fetal Tracing: Baseline:  130 Variability: moderate Accelerations:  present Decelerations: absent Toco: flat   Labs: Results for orders placed or performed during the hospital encounter of 03/14/24 (from the past 24 hours)  Urinalysis, Routine w reflex microscopic -Urine, Clean Catch     Status: Abnormal   Collection Time: 03/14/24 11:56 AM  Result Value Ref Range   Color, Urine YELLOW YELLOW   APPearance CLOUDY (A) CLEAR   Specific Gravity, Urine 1.006 1.005 - 1.030   pH 7.0 5.0 - 8.0   Glucose, UA NEGATIVE NEGATIVE mg/dL   Hgb urine dipstick SMALL (A) NEGATIVE   Bilirubin Urine NEGATIVE NEGATIVE   Ketones, ur NEGATIVE NEGATIVE mg/dL   Protein, ur NEGATIVE NEGATIVE mg/dL   Nitrite NEGATIVE NEGATIVE   Leukocytes,Ua LARGE (A) NEGATIVE   RBC / HPF 0-5 0 - 5 RBC/hpf   WBC, UA 11-20 0 - 5 WBC/hpf   Bacteria, UA FEW (A) NONE SEEN   Squamous Epithelial / HPF 21-50 0 - 5 /HPF   Mucus PRESENT   CBC     Status: Abnormal   Collection Time: 03/14/24 12:05 PM  Result Value Ref Range   WBC 9.5 4.0 - 10.5 K/uL   RBC 3.90 3.87 - 5.11 MIL/uL   Hemoglobin 11.0 (L) 12.0 - 15.0 g/dL   HCT 67.1 (L) 63.9 - 53.9 %   MCV 84.1 80.0 - 100.0 fL   MCH 28.2 26.0 - 34.0 pg   MCHC 33.5 30.0 - 36.0 g/dL   RDW 86.7 88.4 - 84.4 %   Platelets 248 150 - 400 K/uL   nRBC 0.0 0.0 - 0.2 %  Comprehensive metabolic panel with GFR     Status: Abnormal   Collection Time: 03/14/24 12:05 PM  Result Value Ref Range   Sodium 138 135 - 145 mmol/L   Potassium 3.5 3.5 - 5.1 mmol/L   Chloride 106 98 - 111 mmol/L   CO2 19 (L) 22 - 32 mmol/L   Glucose, Bld 76 70 - 99 mg/dL   BUN <5 (L) 6 - 20 mg/dL   Creatinine, Ser 9.43 0.44 - 1.00 mg/dL   Calcium 8.7 (L) 8.9 - 10.3 mg/dL   Total Protein 6.4 (L) 6.5 - 8.1 g/dL   Albumin 2.6 (L) 3.5 - 5.0 g/dL   AST 26 15 - 41 U/L   ALT 19 0 - 44 U/L   Alkaline Phosphatase 71 38 - 126 U/L   Total Bilirubin 0.5 0.0 - 1.2 mg/dL   GFR, Estimated >39 >39 mL/min   Anion gap 13 5 - 15  Protein / creatinine  ratio, urine     Status: Abnormal   Collection Time: 03/14/24 12:07 PM  Result Value Ref Range   Creatinine, Urine 43 mg/dL   Total Protein, Urine 9 mg/dL   Protein Creatinine Ratio  0.21 (H) 0.00 - 0.15 mg/mg[Cre]    Imaging:  No results found.  MDM & MAU COURSE  MDM: Moderate  MAU Course: Orders Placed This Encounter  Procedures   CBC   Comprehensive metabolic panel with GFR   Protein / creatinine ratio, urine   Urinalysis, Routine w reflex microscopic -Urine, Clean Catch   Discharge patient   Meds ordered this encounter  Medications   acetaminophen  (TYLENOL ) tablet 1,000 mg   caffeine  tablet 200 mg   Patient presenting with elevated blood pressures with a history of preeclampsia.  CBC, CMP, and UPC unremarkable for preeclampsia at this time.  Patient given acetaminophen  and caffeine  with relief of headache.  Throughout visit, patient remained mildly hypertensive and at no time required medications for severe hypertension.  Cervical exam completed per patient request, internal os closed.  Last medication adjustment 5 days ago.  Plan for blood pressure check in MAU on Saturday, can discuss medication management at that time.  ASSESSMENT   1. Preexisting hypertension complicating pregnancy, antepartum   2. [redacted] weeks gestation of pregnancy     PLAN  Discharge home in stable condition with return precautions.  Follow-up in the MAU on Saturday, 8/30 for a blood pressure check.  Can consider increasing dose of antihypertensive at that time.   Follow-up Information     [facility]. Go in 2 day(s).   Specialty: Obstetrics and Gynecology Why: Please come in on Saturday, 8/30, for a blood pressure check Contact information: [address] [place]  [address] [phone]                Allergies as of 03/14/2024   No Known Allergies      Medication List     TAKE these medications    Accu-Chek Guide w/Device Kit 1 Device  by Does not apply route 4 (four) times daily.   Accu-Chek Softclix Lancets lancets 1 each by Other route 4 (four) times daily.   acetaminophen  500 MG tablet Commonly known as: TYLENOL  Take 2 tablets (1,000 mg total) by mouth every 6 (six) hours as needed.   aspirin  EC 81 MG tablet Take 2 tablets (162 mg total) by mouth at bedtime. Start taking when you are [redacted] weeks pregnant for rest of pregnancy for prevention of preeclampsia   cyclobenzaprine  10 MG tablet Commonly known as: FLEXERIL  Take 1 tablet (10 mg total) by mouth 2 (two) times daily as needed.   cyclobenzaprine  10 MG tablet Commonly known as: FLEXERIL  Take 1 tablet (10 mg total) by mouth 2 (two) times daily as needed for muscle spasms.   diphenhydrAMINE  25 mg capsule Commonly known as: BENADRYL  Take 1 capsule (25 mg total) by mouth every 6 (six) hours as needed (Headache).   Excedrin  Tension Headache 500-65 MG Tabs per tablet Generic drug: acetaminophen -caffeine  Take 2 tablets by mouth every 8 (eight) hours as needed (Headache).   glucose blood test strip 1 each by Other route in the morning, at noon, in the evening, and at bedtime. Use as instructed   labetalol  300 MG tablet Commonly known as: NORMODYNE  Take 2 tablets (600 mg total) by mouth 3 (three) times daily.   metoCLOPramide  10 MG tablet Commonly known as: REGLAN  Take 1 tablet (10 mg total) by mouth 4 (four) times daily as needed (headache).   metroNIDAZOLE  500 MG tablet Commonly known as: FLAGYL  Take 1 tablet (500 mg total) by mouth 2 (two) times daily for 7 days.   Vitafol  Gummies 3.33-0.333-34.8  MG Chew Chew 1 tablet by mouth daily.        Charlie Courts, MD  Family Medicine - Obstetrics Fellow

## 2024-03-14 NOTE — MAU Note (Signed)
 Brittany [name] is a 43 y.o. at [redacted]w[redacted]d here in MAU reporting: she was sent over from a routine office visit due to elevated b/p. Pt reports headache since this am. Denies visual symptoms or RUQ pain. Reports positive fetal movement. Also reports cramping and some pressure.   Onset of complaint: today Pain score: 5/10 headache                     7/10 cramping and pressure There were no vitals filed for this visit.   FHT: 146  Lab orders placed from triage: none

## 2024-03-14 NOTE — Progress Notes (Signed)
 Subjective:  Brittany [name] is a 43 y.o. female here for BP check.   Hypertension ROS: Patient notes headaches, visual symptoms, No RUQ/epigastric pain pain and pressure.cramping since 2-3am took B/P medication at 7:50 am today. Objective:  LMP 08/11/2023   Appearance alert, pt is in discomfort. General exam BP noted to be 163/95 P:89 today in office.    Assessment:   Blood Pressure needs further observation.   Plan:  Pt to report to MAU for evaluation.

## 2024-03-16 ENCOUNTER — Inpatient Hospital Stay (HOSPITAL_COMMUNITY)

## 2024-03-16 ENCOUNTER — Encounter (HOSPITAL_COMMUNITY): Payer: Self-pay | Admitting: Obstetrics & Gynecology

## 2024-03-16 ENCOUNTER — Inpatient Hospital Stay (HOSPITAL_COMMUNITY)
Admission: AD | Admit: 2024-03-16 | Discharge: 2024-03-16 | Disposition: A | Discharge status: Home / Self Care | Attending: Obstetrics & Gynecology | Admitting: Obstetrics & Gynecology

## 2024-03-16 ENCOUNTER — Other Ambulatory Visit: Payer: Self-pay

## 2024-03-16 DIAGNOSIS — Z3A31 31 weeks gestation of pregnancy: Secondary | ICD-10-CM | POA: Diagnosis not present

## 2024-03-16 DIAGNOSIS — Z79899 Other long term (current) drug therapy: Secondary | ICD-10-CM | POA: Diagnosis not present

## 2024-03-16 DIAGNOSIS — O10913 Unspecified pre-existing hypertension complicating pregnancy, third trimester: Secondary | ICD-10-CM | POA: Diagnosis present

## 2024-03-16 DIAGNOSIS — O09523 Supervision of elderly multigravida, third trimester: Secondary | ICD-10-CM | POA: Insufficient documentation

## 2024-03-16 DIAGNOSIS — O24419 Gestational diabetes mellitus in pregnancy, unspecified control: Secondary | ICD-10-CM | POA: Insufficient documentation

## 2024-03-16 DIAGNOSIS — O368130 Decreased fetal movements, third trimester, not applicable or unspecified: Secondary | ICD-10-CM | POA: Diagnosis not present

## 2024-03-16 DIAGNOSIS — I10 Essential (primary) hypertension: Secondary | ICD-10-CM

## 2024-03-16 DIAGNOSIS — O0990 Supervision of high risk pregnancy, unspecified, unspecified trimester: Secondary | ICD-10-CM

## 2024-03-16 DIAGNOSIS — Z3689 Encounter for other specified antenatal screening: Secondary | ICD-10-CM

## 2024-03-16 DIAGNOSIS — O26893 Other specified pregnancy related conditions, third trimester: Secondary | ICD-10-CM | POA: Diagnosis not present

## 2024-03-16 DIAGNOSIS — O23593 Infection of other part of genital tract in pregnancy, third trimester: Secondary | ICD-10-CM | POA: Diagnosis not present

## 2024-03-16 DIAGNOSIS — B9689 Other specified bacterial agents as the cause of diseases classified elsewhere: Secondary | ICD-10-CM | POA: Diagnosis not present

## 2024-03-16 DIAGNOSIS — R519 Headache, unspecified: Secondary | ICD-10-CM | POA: Diagnosis not present

## 2024-03-16 LAB — WET PREP, GENITAL
Sperm: NONE SEEN
Trich, Wet Prep: NONE SEEN
WBC, Wet Prep HPF POC: >=10 — AB (ref <10)
Yeast Wet Prep HPF POC: NONE SEEN

## 2024-03-16 LAB — CBC
HCT: 32.7 % — ABNORMAL LOW (ref 36.0–46.0)
Hemoglobin: 10.8 g/dL — ABNORMAL LOW (ref 12.0–15.0)
MCH: 27.6 pg (ref 26.0–34.0)
MCHC: 33 g/dL (ref 30.0–36.0)
MCV: 83.6 fL (ref 80.0–100.0)
Platelets: 259 K/uL (ref 150–400)
RBC: 3.91 MIL/uL (ref 3.87–5.11)
RDW: 13.2 % (ref 11.5–15.5)
WBC: 11.3 K/uL — ABNORMAL HIGH (ref 4.0–10.5)
nRBC: 0 % (ref 0.0–0.2)

## 2024-03-16 LAB — COMPREHENSIVE METABOLIC PANEL WITH GFR
ALT: 19 U/L (ref 0–44)
AST: 28 U/L (ref 15–41)
Albumin: 2.6 g/dL — ABNORMAL LOW (ref 3.5–5.0)
Alkaline Phosphatase: 80 U/L (ref 38–126)
Anion gap: 10 (ref 5–15)
BUN: 5 mg/dL — ABNORMAL LOW (ref 6–20)
CO2: 22 mmol/L (ref 22–32)
Calcium: 8.5 mg/dL — ABNORMAL LOW (ref 8.9–10.3)
Chloride: 103 mmol/L (ref 98–111)
Creatinine, Ser: 0.66 mg/dL (ref 0.44–1.00)
GFR, Estimated: >60 mL/min (ref >60)
Glucose, Bld: 89 mg/dL (ref 70–99)
Potassium: 3.5 mmol/L (ref 3.5–5.1)
Sodium: 135 mmol/L (ref 135–145)
Total Bilirubin: 0.5 mg/dL (ref 0.0–1.2)
Total Protein: 6.5 g/dL (ref 6.5–8.1)

## 2024-03-16 LAB — URINALYSIS, ROUTINE W REFLEX MICROSCOPIC
Bilirubin Urine: NEGATIVE
Glucose, UA: NEGATIVE mg/dL
Hgb urine dipstick: NEGATIVE
Ketones, ur: NEGATIVE mg/dL
Nitrite: NEGATIVE
Protein, ur: 30 mg/dL — AB
Specific Gravity, Urine: 1.021 (ref 1.005–1.030)
pH: 6 (ref 5.0–8.0)

## 2024-03-16 MED ORDER — SUMATRIPTAN SUCCINATE 100 MG PO TABS: 100.0000 mg | ORAL_TABLET | Freq: Once | ORAL | 0 refills | Status: AC | PRN

## 2024-03-16 MED ORDER — HYDRALAZINE HCL 10 MG PO TABS
10.0000 mg | ORAL_TABLET | Freq: Once | ORAL | Status: DC
  Filled 2024-03-16: qty 1

## 2024-03-16 MED ORDER — MAG-OXIDE 200 MG PO TABS: 400.0000 mg | ORAL_TABLET | Freq: Every day | ORAL | 3 refills | Status: AC

## 2024-03-16 MED ORDER — METOCLOPRAMIDE HCL 10 MG PO TABS
10.0000 mg | ORAL_TABLET | Freq: Once | ORAL | Status: AC
  Administered 2024-03-16: 10 mg via ORAL
  Filled 2024-03-16: qty 1

## 2024-03-16 MED ORDER — BUTALBITAL-APAP-CAFFEINE 50-325-40 MG PO TABS
2.0000 | ORAL_TABLET | Freq: Once | ORAL | Status: AC
  Administered 2024-03-16: 2 via ORAL
  Filled 2024-03-16: qty 2

## 2024-03-16 MED ORDER — SUMATRIPTAN SUCCINATE 25 MG PO TABS
25.0000 mg | ORAL_TABLET | ORAL | Status: DC | PRN
Start: 2024-03-16 — End: 2024-03-16

## 2024-03-16 MED ORDER — AMLODIPINE BESYLATE 5 MG PO TABS: 5.0000 mg | ORAL_TABLET | Freq: Every day | ORAL | 5 refills | Status: DC

## 2024-03-16 NOTE — MAU Note (Signed)
 Brittany [name] is a 43 y.o. at [redacted]w[redacted]d here in MAU reporting: ongoing headache that she has every day that she is rating a 6. Reporting pressure and sharp pains in lower abdomen and vagina. Upper mid abdomen feels sore. Denies any LOF or VB. Reporting less fetal movement than normal. Denies taking any medication for HA today.   Onset of complaint: ongoing  Pain score: HA 6 abdominal pain/pelvis 7 Vitals:   03/16/24 1216  BP: (!) 142/87  Pulse: 80  Resp: 17  Temp: 98.3 F (36.8 C)  SpO2: 100%     FHT:143 Lab orders placed from triage:

## 2024-03-16 NOTE — Discharge Instructions (Signed)
For prevention of migraines in pregnancy: -Magnesium, 400mg by mouth, once daily -Vitamin B2, 400mg by mouth, once daily  For treatment of migraines in pregnancy: -take medication at the first sign of the pain of a headache, or the first sign of your aura -start with 1000mg Tylenol (do not exceed 4000mg of Tylenol in 24hrs), with or without Reglan 10mg -if no relief after 1-2hours, can take Flexeril 10mg -if headache is severe and not relieved by the above, may take Fioricet, 1 tablet, no more than 3 days per month -Fioricet should only be used as a rescue medication, when absolutely necessary -if the above regimen does not resolve your headache at all, please come to MAU for additional treatment -if you take Fioricet, please be aware that this has Tylenol in it and will contribute to the 4,000mg of Tylenol that you are allowed to take per day  

## 2024-03-16 NOTE — MAU Provider Note (Cosign Needed Addendum)
 Chief Complaint:  Abdominal Pain, Headache, and Decreased Fetal Movement  HPI   Event Date/Time   First Provider Initiated Contact with Patient 03/16/24 1244     Brittany [name] is a 43 y.o. [id number] at [redacted]w[redacted]d who presents to maternity admissions for BP check that was planned during MAU visit on 03/14/24. She is also reporting abdominal cramping and sharp pain in lower abdomen. Describes increased vaginal discharge that appears white. BV found on wet prep 03/09/24; treated with Flagyl  with a few doses remaining. Denies dysuria but reports increased pelvic pressure while urinating, relieved by stopping urine stream. Denies vaginal bleeding. Continues to have daily headaches, present for >2 mon. BP med previously changed from nifedipine  to labetalol  with no improvement in headache; currently taking labetalol  as prescribed. Has tried Tylenol  and Excedrin  Tension without improvement; none taken today. BP mildly elevated at home 130s-140s systolic. Reports decreased fetal movement, movement is present today but less than previous.  Pregnancy Course: prenatal care received at [facility], pregnancy complicated by GDM, cHTN, AMA  Past Medical History:  Diagnosis Date   Anemia 11/30/2016   Cervical dysplasia    Had LEEP   Gallstone    Heart murmur    Hypertension    Migraine 03/18/2016   Needs referral to headache specialist   Murmur, cardiac 11/30/2016   Cardiology evaluation: normal 12/2017   Severe preeclampsia    Subclinical hyperthyroidism 12/09/2015   UTI (urinary tract infection)    OB History  Gravida Para Term Preterm AB Living  11 5 2 3 5 5   SAB IAB Ectopic Multiple Live Births  4 1 0 0 5    # Outcome Date GA Lbr Len/2nd Weight Sex Type Anes PTL Lv  11 Current           10 Preterm 12/13/22 [redacted]w[redacted]d  2041 g F Vag-Spont EPI  LIV     Birth Comments: Induced for severe preeclampsia     Complications: Severe preeclampsia  9 Preterm 06/13/18 [redacted]w[redacted]d 07:04 / 00:15 2300 g F Vag-Spont EPI   LIV     Birth Comments: Induced for severe preeclampsia     Complications: Severe preeclampsia  8 Term 05/03/16 [redacted]w[redacted]d 00:08 / 00:01 2175 g M Vag-Spont None  LIV     Birth Comments: Mild preeclampsia     Complications: Preeclampsia  7 Preterm 04/06/11 [redacted]w[redacted]d 08:25 / 00:08 2455 g F Vag-Spont None  LIV     Birth Comments: Mild preeclampsia     Complications: Preeclampsia  6 Term 10/21/05 [redacted]w[redacted]d   M Vag-Spont   LIV     Complications: Failure to Progress in Second Stage  5 IAB           4 SAB           3 SAB           2 SAB           1 SAB            Past Surgical History:  Procedure Laterality Date   DILATION AND CURETTAGE OF UTERUS     LEEP     Family History  Problem Relation Age of Onset   Asthma Mother    Hypertension Mother    CAD Mother    Asthma Maternal Grandmother    Hypertension Maternal Grandmother    Social History   Tobacco Use   Smoking status: Never   Smokeless tobacco: Never  Vaping Use   Vaping status: Never Used  Substance Use Topics   Alcohol use: Not Currently   Drug use: No   No Known Allergies Medications Prior to Admission  Medication Sig Dispense Refill Last Dose/Taking   acetaminophen  (TYLENOL ) 500 MG tablet Take 2 tablets (1,000 mg total) by mouth every 6 (six) hours as needed. 100 tablet 2 Past Week   acetaminophen -caffeine  (EXCEDRIN  TENSION HEADACHE) 500-65 MG TABS per tablet Take 2 tablets by mouth every 8 (eight) hours as needed (Headache). 60 tablet 0 Past Week   aspirin  EC 81 MG tablet Take 2 tablets (162 mg total) by mouth at bedtime. Start taking when you are [redacted] weeks pregnant for rest of pregnancy for prevention of preeclampsia 300 tablet 2 03/15/2024   cyclobenzaprine  (FLEXERIL ) 10 MG tablet Take 1 tablet (10 mg total) by mouth 2 (two) times daily as needed. 20 tablet 0 Past Month   cyclobenzaprine  (FLEXERIL ) 10 MG tablet Take 1 tablet (10 mg total) by mouth 2 (two) times daily as needed for muscle spasms. 20 tablet 3 Past Month    diphenhydrAMINE  (BENADRYL ) 25 mg capsule Take 1 capsule (25 mg total) by mouth every 6 (six) hours as needed (Headache). 30 capsule 0 Past Month   glucose blood test strip 1 each by Other route in the morning, at noon, in the evening, and at bedtime. Use as instructed 100 each 12 03/15/2024   labetalol  (NORMODYNE ) 300 MG tablet Take 2 tablets (600 mg total) by mouth 3 (three) times daily. 90 tablet 5 03/16/2024 at  8:00 AM   metoCLOPramide  (REGLAN ) 10 MG tablet Take 1 tablet (10 mg total) by mouth 4 (four) times daily as needed (headache). 30 tablet 0 Past Month   metroNIDAZOLE  (FLAGYL ) 500 MG tablet Take 1 tablet (500 mg total) by mouth 2 (two) times daily for 7 days. 14 tablet 0 03/16/2024   Prenatal Vit-Fe Phos-FA-Omega (VITAFOL  GUMMIES) 3.33-0.333-34.8 MG CHEW Chew 1 tablet by mouth daily. 90 tablet 5 03/16/2024   Accu-Chek Softclix Lancets lancets 1 each by Other route 4 (four) times daily. 100 each 12    Blood Glucose Monitoring Suppl (ACCU-CHEK GUIDE) w/Device KIT 1 Device by Does not apply route 4 (four) times daily. 1 kit 0    I have reviewed patient's Past Medical Hx, Surgical Hx, Family Hx, Social Hx, medications and allergies.   ROS  Pertinent items noted in HPI and remainder of comprehensive ROS otherwise negative.   PHYSICAL EXAM  Patient Vitals for the past 24 hrs:  BP Temp Temp src Pulse Resp SpO2 Height Weight  03/16/24 1654 138/85 -- -- 76 (P) 16 (P) 99 % -- --  03/16/24 1550 -- -- -- -- -- 99 % -- --  03/16/24 1540 -- -- -- -- -- 99 % -- --  03/16/24 1531 (!) 151/91 -- -- 78 -- -- -- --  03/16/24 1530 -- -- -- -- -- 98 % -- --  03/16/24 1515 (!) 138/90 -- -- 81 -- 99 % -- --  03/16/24 1500 138/85 -- -- 79 -- 98 % -- --  03/16/24 1445 (!) 139/102 -- -- 75 -- 97 % -- --  03/16/24 1430 138/83 -- -- 79 -- 99 % -- --  03/16/24 1415 130/84 -- -- 78 -- 98 % -- --  03/16/24 1400 (!) 132/93 -- -- 77 -- 99 % -- --  03/16/24 1345 (!) 145/100 -- -- 80 -- 100 % -- --  03/16/24 1335 --  -- -- -- -- 99 % -- --  03/16/24 1330 ROLLEN)  146/100 -- -- 82 -- 99 % -- --  03/16/24 1315 139/89 -- -- 81 -- 99 % -- --  03/16/24 1245 (!) 139/90 -- -- 84 -- 98 % -- --  03/16/24 1231 (!) 142/89 -- -- 81 -- -- -- --  03/16/24 1216 (!) 142/87 98.3 F (36.8 C) Oral 80 17 100 % 5' 5 (1.651 m) 77.9 kg   Constitutional: Well-developed, well-nourished female in no acute distress.  Cardiovascular: normal rate & rhythm, warm and well-perfused Respiratory: normal effort, no problems with respiration noted GI: Abd soft, non-distended, tender to palpation in RUQ and lower abdomen MS: Extremities nontender, no edema, normal ROM Neurologic: Alert and oriented x 4.  GU: deferred Pelvic: deferred   Fetal Tracing: Reactive, Cat 1 Baseline: 135 Variability: moderate Accelerations: 15x15 Decelerations: none Toco: irregular contractions >10 min apart   Labs: Results for orders placed or performed during the hospital encounter of 03/16/24 (from the past 24 hours)  Urinalysis, Routine w reflex microscopic -Urine, Clean Catch     Status: Abnormal   Collection Time: 03/16/24  1:06 PM  Result Value Ref Range   Color, Urine YELLOW YELLOW   APPearance HAZY (A) CLEAR   Specific Gravity, Urine 1.021 1.005 - 1.030   pH 6.0 5.0 - 8.0   Glucose, UA NEGATIVE NEGATIVE mg/dL   Hgb urine dipstick NEGATIVE NEGATIVE   Bilirubin Urine NEGATIVE NEGATIVE   Ketones, ur NEGATIVE NEGATIVE mg/dL   Protein, ur 30 (A) NEGATIVE mg/dL   Nitrite NEGATIVE NEGATIVE   Leukocytes,Ua SMALL (A) NEGATIVE   RBC / HPF 0-5 0 - 5 RBC/hpf   WBC, UA 0-5 0 - 5 WBC/hpf   Bacteria, UA RARE (A) NONE SEEN   Squamous Epithelial / HPF 6-10 0 - 5 /HPF   Mucus PRESENT   Wet prep, genital     Status: Abnormal   Collection Time: 03/16/24  1:06 PM   Specimen: Urine, Clean Catch  Result Value Ref Range   Yeast Wet Prep HPF POC NONE SEEN NONE SEEN   Trich, Wet Prep NONE SEEN NONE SEEN   Clue Cells Wet Prep HPF POC PRESENT (A) NONE SEEN    WBC, Wet Prep HPF POC >=10 (A) <10   Sperm NONE SEEN   CBC     Status: Abnormal   Collection Time: 03/16/24  1:21 PM  Result Value Ref Range   WBC 11.3 (H) 4.0 - 10.5 K/uL   RBC 3.91 3.87 - 5.11 MIL/uL   Hemoglobin 10.8 (L) 12.0 - 15.0 g/dL   HCT 67.2 (L) 63.9 - 53.9 %   MCV 83.6 80.0 - 100.0 fL   MCH 27.6 26.0 - 34.0 pg   MCHC 33.0 30.0 - 36.0 g/dL   RDW 86.7 88.4 - 84.4 %   Platelets 259 150 - 400 K/uL   nRBC 0.0 0.0 - 0.2 %  Comprehensive metabolic panel     Status: Abnormal   Collection Time: 03/16/24  1:21 PM  Result Value Ref Range   Sodium 135 135 - 145 mmol/L   Potassium 3.5 3.5 - 5.1 mmol/L   Chloride 103 98 - 111 mmol/L   CO2 22 22 - 32 mmol/L   Glucose, Bld 89 70 - 99 mg/dL   BUN 5 (L) 6 - 20 mg/dL   Creatinine, Ser 9.33 0.44 - 1.00 mg/dL   Calcium 8.5 (L) 8.9 - 10.3 mg/dL   Total Protein 6.5 6.5 - 8.1 g/dL   Albumin 2.6 (L) 3.5 - 5.0 g/dL  AST 28 15 - 41 U/L   ALT 19 0 - 44 U/L   Alkaline Phosphatase 80 38 - 126 U/L   Total Bilirubin 0.5 0.0 - 1.2 mg/dL   GFR, Estimated >39 >39 mL/min   Anion gap 10 5 - 15   Imaging:  No results found.  MDM & MAU COURSE  MDM: Moderate  MAU Course: Orders Placed This Encounter  Procedures   Wet prep, genital   Urinalysis, Routine w reflex microscopic -Urine, Clean Catch   CBC   Comprehensive metabolic panel   AMB referral to headache clinic   Discharge patient Discharge disposition: 01-Home or Self Care; Discharge patient date: 03/16/2024   Meds ordered this encounter  Medications   butalbital -acetaminophen -caffeine  (FIORICET ) 50-325-40 MG per tablet 2 tablet   metoCLOPramide  (REGLAN ) tablet 10 mg   DISCONTD: hydrALAZINE  (APRESOLINE ) tablet 10 mg   DISCONTD: SUMAtriptan  (IMITREX ) tablet 25 mg   SUMAtriptan  (IMITREX ) 100 MG tablet    Sig: Take 1 tablet (100 mg total) by mouth once as needed for up to 1 dose for migraine. May repeat in 2 hours if headache persists or recurs.    Dispense:  9 tablet    Refill:  0    amLODipine  (NORVASC ) 5 MG tablet    Sig: Take 1 tablet (5 mg total) by mouth daily.    Dispense:  30 tablet    Refill:  5   Magnesium  Oxide -Mg Supplement (MAG-OXIDE) 200 MG TABS    Sig: Take 2 tablets (400 mg total) by mouth at bedtime. If that amount causes loose stools in the am, switch to 200mg  daily at bedtime.    Dispense:  60 tablet    Refill:  3   Plan to collect labs to rule out pre-eclampsia-- lab results reviewed and normal BP elevated mild range today-- plan to add Norvasc  5 mg daily with current Labetalol  600 mg TID Fioricet  for headache, did not help when taken in June, but would like to avoid IV or sedating med today UA-- result not suspicious for UTI Wet prep shows no yeast and BV still present, plan to finish Flagyl   Headache not improved with Fioricet  and Reglan . Recommended head CT and trial of Imitrex  here which patient declines due to childcare. Will discharge home with Imitrex  to try. Add Magnesium  at night. Plans to see headache specialist.  ASSESSMENT   1. Headache in pregnancy, third trimester   2. Chronic hypertension   3. NST (non-stress test) reactive   4. [redacted] weeks gestation of pregnancy   5. Supervision of high risk pregnancy, antepartum     PLAN  Discharge home in stable condition with return precautions. Follow up for OB visit with Anyanwu, MD 03/21/24, OB cardiology 03/21/24, and headache specialist 04/05/24.    Follow-up Information     [facility] Follow up.   Specialty: Obstetrics and Gynecology Why: as scheduled for ongoing prenatal care and headache clinic Contact information: [address] [place]  [address] [phone]                Allergies as of 03/16/2024   No Known Allergies      Medication List     STOP taking these medications    metoCLOPramide  10 MG tablet Commonly known as: REGLAN        TAKE these medications    Accu-Chek Guide w/Device  Kit 1 Device by Does not apply route 4 (four) times daily.  Accu-Chek Softclix Lancets lancets 1 each by Other route 4 (four) times daily.   acetaminophen  500 MG tablet Commonly known as: TYLENOL  Take 2 tablets (1,000 mg total) by mouth every 6 (six) hours as needed.   amLODipine  5 MG tablet Commonly known as: Norvasc  Take 1 tablet (5 mg total) by mouth daily.   aspirin  EC 81 MG tablet Take 2 tablets (162 mg total) by mouth at bedtime. Start taking when you are [redacted] weeks pregnant for rest of pregnancy for prevention of preeclampsia   cyclobenzaprine  10 MG tablet Commonly known as: FLEXERIL  Take 1 tablet (10 mg total) by mouth 2 (two) times daily as needed for muscle spasms. What changed: Another medication with the same name was removed. Continue taking this medication, and follow the directions you see here.   diphenhydrAMINE  25 mg capsule Commonly known as: BENADRYL  Take 1 capsule (25 mg total) by mouth every 6 (six) hours as needed (Headache).   Excedrin  Tension Headache 500-65 MG Tabs per tablet Generic drug: acetaminophen -caffeine  Take 2 tablets by mouth every 8 (eight) hours as needed (Headache).   glucose blood test strip 1 each by Other route in the morning, at noon, in the evening, and at bedtime. Use as instructed   labetalol  300 MG tablet Commonly known as: NORMODYNE  Take 2 tablets (600 mg total) by mouth 3 (three) times daily.   Mag-Oxide 200 MG Tabs Generic drug: Magnesium  Oxide -Mg Supplement Take 2 tablets (400 mg total) by mouth at bedtime. If that amount causes loose stools in the am, switch to 200mg  daily at bedtime.   metroNIDAZOLE  500 MG tablet Commonly known as: FLAGYL  Take 1 tablet (500 mg total) by mouth 2 (two) times daily for 7 days.   SUMAtriptan  100 MG tablet Commonly known as: IMITREX  Take 1 tablet (100 mg total) by mouth once as needed for up to 1 dose for migraine. May repeat in 2 hours if headache persists or recurs.   Vitafol  Gummies  3.33-0.333-34.8 MG Chew Chew 1 tablet by mouth daily.       Vernell Ruddle, SNM 03/16/24 1738  Attestation of Supervision of Student:  I confirm that I have verified the information documented in the nurse midwife student's note and that I have also personally supervised the history, physical exam and all medical decision making activities.  I have verified that all services and findings are accurately documented in this student's note; and I agree with management and plan as outlined in the documentation. I have also made any necessary editorial changes.  Cornell JONELLE Finder, CNM [facility] [employer], [facility] 03/16/2024 9:51 PM

## 2024-03-19 ENCOUNTER — Encounter: Payer: Self-pay | Admitting: *Deleted

## 2024-03-20 ENCOUNTER — Encounter: Payer: PRIVATE HEALTH INSURANCE | Admitting: Obstetrics and Gynecology

## 2024-03-20 NOTE — Progress Notes (Deleted)
 I connected with  Brittany [name] on 03/07/2024 by a video enabled telemedicine application and verified that I am speaking with the correct person using two identifiers. Dietitian conducting appointment in office with patient stating their location of parked car at home.  I discussed the limitations of evaluation and management by telemedicine. The patient expressed understanding and requested to reschedule.   Patient was seen for Gestational Diabetes on 03/07/2024 *** Start time ***0900 and End time ***0922   Estimated due date: 05/17/2024; [redacted]w[redacted]d  Clinical: Medications:  Current Outpatient Medications:    Accu-Chek Softclix Lancets lancets, 1 each by Other route 4 (four) times daily., Disp: 100 each, Rfl: 12   acetaminophen  (TYLENOL ) 500 MG tablet, Take 2 tablets (1,000 mg total) by mouth every 6 (six) hours as needed., Disp: 100 tablet, Rfl: 2   acetaminophen -caffeine  (EXCEDRIN  TENSION HEADACHE) 500-65 MG TABS per tablet, Take 2 tablets by mouth every 8 (eight) hours as needed (Headache)., Disp: 60 tablet, Rfl: 0   amLODipine  (NORVASC ) 5 MG tablet, Take 1 tablet (5 mg total) by mouth daily., Disp: 30 tablet, Rfl: 5   aspirin  EC 81 MG tablet, Take 2 tablets (162 mg total) by mouth at bedtime. Start taking when you are [redacted] weeks pregnant for rest of pregnancy for prevention of preeclampsia, Disp: 300 tablet, Rfl: 2   Blood Glucose Monitoring Suppl (ACCU-CHEK GUIDE) w/Device KIT, 1 Device by Does not apply route 4 (four) times daily., Disp: 1 kit, Rfl: 0   cyclobenzaprine  (FLEXERIL ) 10 MG tablet, Take 1 tablet (10 mg total) by mouth 2 (two) times daily as needed for muscle spasms., Disp: 20 tablet, Rfl: 3   diphenhydrAMINE  (BENADRYL ) 25 mg capsule, Take 1 capsule (25 mg total) by mouth every 6 (six) hours as needed (Headache)., Disp: 30 capsule, Rfl: 0   glucose blood test strip, 1 each by Other route in the morning, at noon, in the evening, and at bedtime. Use as instructed, Disp: 100 each,  Rfl: 12   labetalol  (NORMODYNE ) 300 MG tablet, Take 2 tablets (600 mg total) by mouth 3 (three) times daily., Disp: 90 tablet, Rfl: 5   Magnesium  Oxide -Mg Supplement (MAG-OXIDE) 200 MG TABS, Take 2 tablets (400 mg total) by mouth at bedtime. If that amount causes loose stools in the am, switch to 200mg  daily at bedtime., Disp: 60 tablet, Rfl: 3   Prenatal Vit-Fe Phos-FA-Omega (VITAFOL  GUMMIES) 3.33-0.333-34.8 MG CHEW, Chew 1 tablet by mouth daily., Disp: 90 tablet, Rfl: 5   SUMAtriptan  (IMITREX ) 100 MG tablet, Take 1 tablet (100 mg total) by mouth once as needed for up to 1 dose for migraine. May repeat in 2 hours if headache persists or recurs., Disp: 9 tablet, Rfl: 0  Medical History:  Past Medical History:  Diagnosis Date   Anemia 11/30/2016   Cervical dysplasia    Had LEEP   Gallstone    Heart murmur    Hypertension    Migraine 03/18/2016   Needs referral to headache specialist   Murmur, cardiac 11/30/2016   Cardiology evaluation: normal 12/2017   Severe preeclampsia    Subclinical hyperthyroidism 12/09/2015   UTI (urinary tract infection)     Labs: OGTT fasting 81, 1 hour 185, 2 hour 135 on 02/22/2024 Lab Results  Component Value Date   HGBA1C 5.5 11/09/2023   Dietary and Lifestyle History:    8/21:Pt presents today alone over video. Pt request rescheduling her appointment stating she needs to drive to her OB appointment. Pt reports she  is monitoring her blood sugar 1-2 times daily. Pt reports she forgot to test her blood sugar this morning and states rushed. Pt states yesterday she tested twice and reports before breakfast blood sugar reading obtained of 90 something and in the afternoon blood sugar reading obtained of 110 mg/dL. Pt reports working full time and states the testing schedule feels impossible to her at this moment. RD encouraged to discuss with her OB and share with her employer the accomodation she is requesting during work hours to meet the goal of QID  testing. Pt states she has no other questions today.   Physical Activity: not reported Stress: not reported Sleep: not reported  24 hr Recall:  First Meal:  not reported Snack:  not reported Second meal:  not reported Snack:  not reported Third meal:  not reported Snack:  not reported Beverages:  not reported  NUTRITION INTERVENTION  Nutrition education (E-1) on the following topics:   Initial Follow-up  [x]  []  Definition of Gestational Diabetes [x]  []  Why dietary management is important in controlling blood glucose []  []  Effects each nutrient has on blood glucose levels []  []  Simple carbohydrates vs complex carbohydrates [x]  []  Fluid intake [x]  []  Creating a balanced meal plan []  []  Carbohydrate counting  [x]  []  When to check blood glucose levels []  []  Proper blood glucose monitoring techniques [x]  []  Effect of stress and stress reduction techniques  [x]  []  Exercise effect on blood glucose levels, appropriate exercise during pregnancy []  []  Importance of limiting caffeine  and abstaining from alcohol and smoking [x]  []  Medications used for blood sugar control during pregnancy []  []  Hypoglycemia and rule of 15 []  []  Postpartum self care  Patient has a meter prior to visit. Patient is instructed to begin testing pre breakfast and 2 hours after each meal.   Patient instructed to monitor glucose levels: QID FBS: 60 - <= 95 mg/dL; 2 hour: <= 879 mg/dL  Patient received handouts:RD sent via mychart messaging Nutrition Diabetes and Pregnancy Carbohydrate Counting List Blood glucose log Snack ideas for diabetes during pregnancy Plate Planner  Patient will be seen for follow-up: 03/21/2024

## 2024-03-21 ENCOUNTER — Ambulatory Visit: Payer: PRIVATE HEALTH INSURANCE

## 2024-03-21 ENCOUNTER — Encounter (HOSPITAL_COMMUNITY): Payer: Self-pay | Admitting: Obstetrics & Gynecology

## 2024-03-21 ENCOUNTER — Other Ambulatory Visit: Payer: Self-pay

## 2024-03-21 ENCOUNTER — Inpatient Hospital Stay (HOSPITAL_COMMUNITY)
Admission: AD | Admit: 2024-03-21 | Discharge: 2024-03-21 | Disposition: A | Discharge status: Other Acute Inpatient Hospital | Payer: Self-pay | Attending: Obstetrics & Gynecology | Admitting: Obstetrics & Gynecology

## 2024-03-21 ENCOUNTER — Ambulatory Visit: Admitting: Cardiology

## 2024-03-21 ENCOUNTER — Ambulatory Visit (INDEPENDENT_AMBULATORY_CARE_PROVIDER_SITE_OTHER): Payer: PRIVATE HEALTH INSURANCE | Admitting: Obstetrics & Gynecology

## 2024-03-21 ENCOUNTER — Inpatient Hospital Stay (HOSPITAL_BASED_OUTPATIENT_CLINIC_OR_DEPARTMENT_OTHER)

## 2024-03-21 VITALS — BP 170/108 | HR 86 | Wt 176.0 lb

## 2024-03-21 DIAGNOSIS — O09293 Supervision of pregnancy with other poor reproductive or obstetric history, third trimester: Secondary | ICD-10-CM | POA: Insufficient documentation

## 2024-03-21 DIAGNOSIS — Z3A31 31 weeks gestation of pregnancy: Secondary | ICD-10-CM

## 2024-03-21 DIAGNOSIS — O99283 Endocrine, nutritional and metabolic diseases complicating pregnancy, third trimester: Secondary | ICD-10-CM | POA: Insufficient documentation

## 2024-03-21 DIAGNOSIS — O26893 Other specified pregnancy related conditions, third trimester: Secondary | ICD-10-CM | POA: Diagnosis not present

## 2024-03-21 DIAGNOSIS — O24419 Gestational diabetes mellitus in pregnancy, unspecified control: Secondary | ICD-10-CM | POA: Diagnosis not present

## 2024-03-21 DIAGNOSIS — O0943 Supervision of pregnancy with grand multiparity, third trimester: Secondary | ICD-10-CM | POA: Diagnosis not present

## 2024-03-21 DIAGNOSIS — O09299 Supervision of pregnancy with other poor reproductive or obstetric history, unspecified trimester: Secondary | ICD-10-CM

## 2024-03-21 DIAGNOSIS — O113 Pre-existing hypertension with pre-eclampsia, third trimester: Secondary | ICD-10-CM | POA: Diagnosis not present

## 2024-03-21 DIAGNOSIS — O365930 Maternal care for other known or suspected poor fetal growth, third trimester, not applicable or unspecified: Secondary | ICD-10-CM

## 2024-03-21 DIAGNOSIS — Z79899 Other long term (current) drug therapy: Secondary | ICD-10-CM | POA: Diagnosis not present

## 2024-03-21 DIAGNOSIS — O24410 Gestational diabetes mellitus in pregnancy, diet controlled: Secondary | ICD-10-CM

## 2024-03-21 DIAGNOSIS — O10919 Unspecified pre-existing hypertension complicating pregnancy, unspecified trimester: Secondary | ICD-10-CM

## 2024-03-21 DIAGNOSIS — E876 Hypokalemia: Secondary | ICD-10-CM | POA: Insufficient documentation

## 2024-03-21 DIAGNOSIS — O10013 Pre-existing essential hypertension complicating pregnancy, third trimester: Secondary | ICD-10-CM | POA: Diagnosis not present

## 2024-03-21 DIAGNOSIS — O0990 Supervision of high risk pregnancy, unspecified, unspecified trimester: Secondary | ICD-10-CM

## 2024-03-21 DIAGNOSIS — R519 Headache, unspecified: Secondary | ICD-10-CM

## 2024-03-21 DIAGNOSIS — R030 Elevated blood-pressure reading, without diagnosis of hypertension: Secondary | ICD-10-CM | POA: Diagnosis present

## 2024-03-21 DIAGNOSIS — O09523 Supervision of elderly multigravida, third trimester: Secondary | ICD-10-CM

## 2024-03-21 DIAGNOSIS — O09213 Supervision of pregnancy with history of pre-term labor, third trimester: Secondary | ICD-10-CM | POA: Diagnosis not present

## 2024-03-21 DIAGNOSIS — O119 Pre-existing hypertension with pre-eclampsia, unspecified trimester: Secondary | ICD-10-CM

## 2024-03-21 LAB — COMPREHENSIVE METABOLIC PANEL WITH GFR
ALT: 20 U/L (ref 0–44)
AST: 31 U/L (ref 15–41)
Albumin: 2.7 g/dL — ABNORMAL LOW (ref 3.5–5.0)
Alkaline Phosphatase: 90 U/L (ref 38–126)
Anion gap: 11 (ref 5–15)
BUN: 5 mg/dL — ABNORMAL LOW (ref 6–20)
CO2: 21 mmol/L — ABNORMAL LOW (ref 22–32)
Calcium: 8.6 mg/dL — ABNORMAL LOW (ref 8.9–10.3)
Chloride: 103 mmol/L (ref 98–111)
Creatinine, Ser: 0.71 mg/dL (ref 0.44–1.00)
GFR, Estimated: >60 mL/min (ref >60)
Glucose, Bld: 112 mg/dL — ABNORMAL HIGH (ref 70–99)
Potassium: 3.2 mmol/L — ABNORMAL LOW (ref 3.5–5.1)
Sodium: 135 mmol/L (ref 135–145)
Total Bilirubin: 0.6 mg/dL (ref 0.0–1.2)
Total Protein: 7.2 g/dL (ref 6.5–8.1)

## 2024-03-21 LAB — CBC
HCT: 34.7 % — ABNORMAL LOW (ref 36.0–46.0)
Hemoglobin: 11.6 g/dL — ABNORMAL LOW (ref 12.0–15.0)
MCH: 28 pg (ref 26.0–34.0)
MCHC: 33.4 g/dL (ref 30.0–36.0)
MCV: 83.6 fL (ref 80.0–100.0)
Platelets: 241 K/uL (ref 150–400)
RBC: 4.15 MIL/uL (ref 3.87–5.11)
RDW: 13.4 % (ref 11.5–15.5)
WBC: 9.2 K/uL (ref 4.0–10.5)
nRBC: 0 % (ref 0.0–0.2)

## 2024-03-21 LAB — PROTEIN / CREATININE RATIO, URINE
Creatinine, Urine: 213 mg/dL
Protein Creatinine Ratio: 0.14 mg/mg{creat} (ref 0.00–0.15)
Total Protein, Urine: 29 mg/dL

## 2024-03-21 MED ORDER — LACTATED RINGERS IV SOLN: INTRAVENOUS | Status: DC

## 2024-03-21 MED ORDER — HYDRALAZINE HCL 20 MG/ML IJ SOLN: 10.0000 mg | INTRAMUSCULAR | Status: DC | PRN

## 2024-03-21 MED ORDER — HYDRALAZINE HCL 20 MG/ML IJ SOLN: 5.0000 mg | INTRAMUSCULAR | Status: DC | PRN

## 2024-03-21 MED ORDER — LABETALOL HCL 5 MG/ML IV SOLN: 40.0000 mg | INTRAVENOUS | Status: DC | PRN

## 2024-03-21 MED ORDER — LABETALOL HCL 5 MG/ML IV SOLN: 20.0000 mg | INTRAVENOUS | Status: DC | PRN

## 2024-03-21 MED ORDER — MAGNESIUM SULFATE 40 GM/1000ML IV SOLN
2.0000 g/h | INTRAVENOUS | Status: DC
  Administered 2024-03-21: 2 g/h via INTRAVENOUS
  Filled 2024-03-21: qty 1000

## 2024-03-21 MED ORDER — MAGNESIUM SULFATE 4 GM/100ML IV SOLN: 4.0000 g | Freq: Once | INTRAVENOUS | Status: DC

## 2024-03-21 MED ORDER — MAGNESIUM SULFATE BOLUS VIA INFUSION
4.0000 g | Freq: Once | INTRAVENOUS | Status: AC
  Administered 2024-03-21: 4 g via INTRAVENOUS
  Filled 2024-03-21: qty 1000

## 2024-03-21 MED ORDER — BETAMETHASONE SOD PHOS & ACET 6 (3-3) MG/ML IJ SUSP
12.0000 mg | Freq: Once | INTRAMUSCULAR | Status: AC
  Administered 2024-03-21: 12 mg via INTRAMUSCULAR
  Filled 2024-03-21: qty 5

## 2024-03-21 MED ORDER — POTASSIUM CHLORIDE CRYS ER 20 MEQ PO TBCR
40.0000 meq | EXTENDED_RELEASE_TABLET | Freq: Two times a day (BID) | ORAL | Status: DC
  Administered 2024-03-21: 40 meq via ORAL
  Filled 2024-03-21: qty 2

## 2024-03-21 NOTE — Patient Instructions (Signed)
 Return to office for any scheduled appointments. Call the office or go to the MAU at [facility] if: You begin to have strong, frequent contractions Your water breaks.  Sometimes it is a big gush of fluid, sometimes it is just a trickle that keeps getting your underwear wet or running down your legs You have vaginal bleeding.  It is normal to have a small amount of spotting if your cervix was checked.  You do not feel your baby moving like normal.  If you do not, get something to eat and drink and lay down and focus on feeling your baby move.   If your baby is still not moving like normal, you should call the office or go to MAU. Any other obstetric concerns.

## 2024-03-21 NOTE — MAU Note (Signed)
 HandOff to [facility] in Labor and Delivery  Triage at [facility] given.

## 2024-03-21 NOTE — MAU Provider Note (Signed)
 Chief Complaint:  Hypertension (Pre-E work up sent from [employer] today, has appointment at 1400 for ultrasound)   HPI   None     Brittany [name] is a 43 y.o. [id number] at [redacted]w[redacted]d who presents to maternity admissions for elevated blood pressures.  She was seen in our office this morning and had blood pressures of 153/100 and 170/108.  She also noted ongoing headache, 7/10 in office and 6/10 in AM.  Headache has not been alleviated with trial of Imitrex .  Denies blurry vision scotomata, RUQ pain.  She has been evaluated in the ED several times for headache, previously believed to be iatrogenic, various blood pressure medications. She has a history of previous pregnancy with preeclampsia, chronic HTN, and AMA that increases the risk of preeclampsia. Patient states that headache sometimes resolves at home, but is present most of the time. It has not responded to any medications tried at home or in the MAU including: Excedrin -Tension, Reglan , Flexeril , Fioricet , Benadryl .  Pregnancy Course: Receives care at [facility] for [facility] . Prenatal records reviewed. Pregnancy complicated by cHTN, gDM, AMA, history of preeclampsia.  Past Medical History:  Diagnosis Date   Anemia 11/30/2016   Cervical dysplasia    Had LEEP   Gallstone    Heart murmur    Hypertension    Migraine 03/18/2016   Needs referral to headache specialist   Murmur, cardiac 11/30/2016   Cardiology evaluation: normal 12/2017   Severe preeclampsia    Subclinical hyperthyroidism 12/09/2015   UTI (urinary tract infection)    OB History  Gravida Para Term Preterm AB Living  11 5 2 3 5 5   SAB IAB Ectopic Multiple Live Births  4 1 0 0 5    # Outcome Date GA Lbr Len/2nd Weight Sex Type Anes PTL Lv  11 Current           10 Preterm 12/13/22 [redacted]w[redacted]d  2041 g F Vag-Spont EPI  LIV     Birth Comments: Induced for severe preeclampsia     Complications: Severe preeclampsia  9 Preterm 06/13/18 [redacted]w[redacted]d 07:04 / 00:15 2300 g F  Vag-Spont EPI  LIV     Birth Comments: Induced for severe preeclampsia     Complications: Severe preeclampsia  8 Term 05/03/16 [redacted]w[redacted]d 00:08 / 00:01 2175 g M Vag-Spont None  LIV     Birth Comments: Mild preeclampsia     Complications: Preeclampsia  7 Preterm 04/06/11 [redacted]w[redacted]d 08:25 / 00:08 2455 g F Vag-Spont None  LIV     Birth Comments: Mild preeclampsia     Complications: Preeclampsia  6 Term 10/21/05 [redacted]w[redacted]d   M Vag-Spont   LIV     Complications: Failure to Progress in Second Stage  5 IAB           4 SAB           3 SAB           2 SAB           1 SAB            Past Surgical History:  Procedure Laterality Date   DILATION AND CURETTAGE OF UTERUS     LEEP     Family History  Problem Relation Age of Onset   Asthma Mother    Hypertension Mother    CAD Mother    Asthma Maternal Grandmother    Hypertension Maternal Grandmother    Social History   Tobacco Use   Smoking status: Never  Smokeless tobacco: Never  Vaping Use   Vaping status: Never Used  Substance Use Topics   Alcohol use: Not Currently   Drug use: No   No Known Allergies Medications Prior to Admission  Medication Sig Dispense Refill Last Dose/Taking   Accu-Chek Softclix Lancets lancets 1 each by Other route 4 (four) times daily. 100 each 12 03/20/2024   acetaminophen  (TYLENOL ) 500 MG tablet Take 2 tablets (1,000 mg total) by mouth every 6 (six) hours as needed. 100 tablet 2 03/20/2024   acetaminophen -caffeine  (EXCEDRIN  TENSION HEADACHE) 500-65 MG TABS per tablet Take 2 tablets by mouth every 8 (eight) hours as needed (Headache). 60 tablet 0 03/21/2024   amLODipine  (NORVASC ) 5 MG tablet Take 1 tablet (5 mg total) by mouth daily. 30 tablet 5 03/20/2024   aspirin  EC 81 MG tablet Take 2 tablets (162 mg total) by mouth at bedtime. Start taking when you are [redacted] weeks pregnant for rest of pregnancy for prevention of preeclampsia 300 tablet 2 03/20/2024   Blood Glucose Monitoring Suppl (ACCU-CHEK GUIDE) w/Device KIT 1 Device by Does  not apply route 4 (four) times daily. 1 kit 0 03/20/2024   glucose blood test strip 1 each by Other route in the morning, at noon, in the evening, and at bedtime. Use as instructed 100 each 12 03/20/2024   Magnesium  Oxide -Mg Supplement (MAG-OXIDE) 200 MG TABS Take 2 tablets (400 mg total) by mouth at bedtime. If that amount causes loose stools in the am, switch to 200mg  daily at bedtime. 60 tablet 3 03/21/2024   Prenatal Vit-Fe Phos-FA-Omega (VITAFOL  GUMMIES) 3.33-0.333-34.8 MG CHEW Chew 1 tablet by mouth daily. 90 tablet 5 03/21/2024   SUMAtriptan  (IMITREX ) 100 MG tablet Take 1 tablet (100 mg total) by mouth once as needed for up to 1 dose for migraine. May repeat in 2 hours if headache persists or recurs. 9 tablet 0 03/20/2024   cyclobenzaprine  (FLEXERIL ) 10 MG tablet Take 1 tablet (10 mg total) by mouth 2 (two) times daily as needed for muscle spasms. 20 tablet 3    diphenhydrAMINE  (BENADRYL ) 25 mg capsule Take 1 capsule (25 mg total) by mouth every 6 (six) hours as needed (Headache). 30 capsule 0 Unknown   labetalol  (NORMODYNE ) 300 MG tablet Take 2 tablets (600 mg total) by mouth 3 (three) times daily. 90 tablet 5     I have reviewed patient's Past Medical Hx, Surgical Hx, Family Hx, Social Hx, medications and allergies.   ROS  Pertinent items noted in HPI and remainder of comprehensive ROS otherwise negative.   PHYSICAL EXAM  Patient Vitals for the past 24 hrs:  BP Temp Temp src Pulse Resp SpO2 Height Weight  03/21/24 1550 (!) 140/92 98 F (36.7 C) Oral 90 16 98 % -- --  03/21/24 1545 (!) 146/97 -- -- (!) 104 -- 99 % -- --  03/21/24 1540 -- -- -- -- -- 99 % -- --  03/21/24 1535 -- -- -- -- -- 99 % -- --  03/21/24 1530 (!) 145/96 -- -- 82 16 99 % -- --  03/21/24 1435 -- -- -- -- -- 98 % -- --  03/21/24 1431 (!) 155/100 -- -- 85 -- -- -- --  03/21/24 1430 -- -- -- -- -- 99 % -- --  03/21/24 1425 -- -- -- -- -- 98 % -- --  03/21/24 1420 -- -- -- -- -- 99 % -- --  03/21/24 1416 (!) 141/109 -- --  90 -- -- -- --  03/21/24  1415 -- -- -- -- -- 100 % -- --  03/21/24 1410 -- -- -- -- -- 99 % -- --  03/21/24 1405 -- -- -- -- -- 100 % -- --  03/21/24 1401 (!) 149/90 -- -- 91 -- -- -- --  03/21/24 1400 -- -- -- -- -- 100 % -- --  03/21/24 1355 -- -- -- -- -- 100 % -- --  03/21/24 1350 -- -- -- -- -- 100 % -- --  03/21/24 1345 (!) 165/102 -- -- 96 -- 100 % -- --  03/21/24 1340 -- -- -- -- -- 99 % -- --  03/21/24 1335 -- -- -- -- -- 98 % -- --  03/21/24 1331 137/84 -- -- 74 -- -- -- --  03/21/24 1330 137/84 -- -- 74 -- 98 % -- --  03/21/24 1325 -- -- -- -- -- 98 % -- --  03/21/24 1320 -- -- -- -- -- 98 % -- --  03/21/24 1316 139/87 -- -- 83 -- -- -- --  03/21/24 1315 -- -- -- -- -- 98 % -- --  03/21/24 1310 -- -- -- -- -- 98 % -- --  03/21/24 1305 -- -- -- -- -- 99 % -- --  03/21/24 1301 (!) 140/91 -- -- 89 -- 98 % -- --  03/21/24 1246 (!) 146/99 -- -- 91 -- -- -- --  03/21/24 1240 -- -- -- -- -- 98 % -- --  03/21/24 1235 -- -- -- -- -- 98 % -- --  03/21/24 1231 (!) 155/88 -- -- 80 -- -- -- --  03/21/24 1230 -- -- -- -- -- 98 % -- --  03/21/24 1219 (!) 143/92 -- -- 86 -- -- -- --  03/21/24 1215 -- -- -- -- -- 100 % -- --  03/21/24 1205 -- -- -- -- -- 99 % -- --  03/21/24 1200 (!) 141/97 -- -- 88 -- 99 % -- --  03/21/24 1155 -- -- -- -- -- 99 % -- --  03/21/24 1150 -- -- -- -- -- 99 % -- --  03/21/24 1146 136/88 -- -- 81 -- -- -- --  03/21/24 1145 -- -- -- -- -- 98 % -- --  03/21/24 1140 -- -- -- -- -- 99 % -- --  03/21/24 1135 -- -- -- -- -- 99 % -- --  03/21/24 1133 (!) 136/93 -- -- 81 -- -- -- --  03/21/24 1130 -- -- -- -- -- 99 % -- --  03/21/24 1125 -- -- -- -- -- 99 % -- --  03/21/24 1123 (!) 134/104 97.9 F (36.6 C) Oral 79 16 99 % -- --  03/21/24 1120 (!) 134/104 -- -- 79 -- 100 % -- --  03/21/24 1105 -- -- -- -- -- -- 5' 5 (1.651 m) 77.7 kg    Constitutional: Well-developed, well-nourished female in no acute distress.  HEENT: atraumatic, normocephalic. Neck has  normal ROM. EOM intact. Cardiovascular: normal rate & rhythm, warm and well-perfused Respiratory: normal effort, no problems with respiration noted GI: Abd soft, non-tender, non-distended MSK: Extremities nontender, no edema, normal ROM Skin: warm and dry. Acyanotic, no jaundice or pallor. Neurologic: Alert and oriented x 4. No abnormal coordination. Psychiatric: Normal mood. Speech not slurred, not rapid/pressured. Patient is cooperative. GU: no CVA tenderness Pelvic exam: deferred     Fetal Tracing: Baseline FHR: 130 per minute Fetal heart variability: moderate Fetal Heart Rate accelerations: yes Fetal Heart Rate decelerations: none Fetal Non-stress Test:  Category I (reactive) Toco: no uterine contractions   Labs: Results for orders placed or performed during the hospital encounter of 03/21/24 (from the past 24 hours)  CBC     Status: Abnormal   Collection Time: 03/21/24 11:16 AM  Result Value Ref Range   WBC 9.2 4.0 - 10.5 K/uL   RBC 4.15 3.87 - 5.11 MIL/uL   Hemoglobin 11.6 (L) 12.0 - 15.0 g/dL   HCT 65.2 (L) 63.9 - 53.9 %   MCV 83.6 80.0 - 100.0 fL   MCH 28.0 26.0 - 34.0 pg   MCHC 33.4 30.0 - 36.0 g/dL   RDW 86.5 88.4 - 84.4 %   Platelets 241 150 - 400 K/uL   nRBC 0.0 0.0 - 0.2 %  Comprehensive metabolic panel with GFR     Status: Abnormal   Collection Time: 03/21/24 11:16 AM  Result Value Ref Range   Sodium 135 135 - 145 mmol/L   Potassium 3.2 (L) 3.5 - 5.1 mmol/L   Chloride 103 98 - 111 mmol/L   CO2 21 (L) 22 - 32 mmol/L   Glucose, Bld 112 (H) 70 - 99 mg/dL   BUN 5 (L) 6 - 20 mg/dL   Creatinine, Ser 9.28 0.44 - 1.00 mg/dL   Calcium 8.6 (L) 8.9 - 10.3 mg/dL   Total Protein 7.2 6.5 - 8.1 g/dL   Albumin 2.7 (L) 3.5 - 5.0 g/dL   AST 31 15 - 41 U/L   ALT 20 0 - 44 U/L   Alkaline Phosphatase 90 38 - 126 U/L   Total Bilirubin 0.6 0.0 - 1.2 mg/dL   GFR, Estimated >39 >39 mL/min   Anion gap 11 5 - 15  Protein / creatinine ratio, urine     Status: None   Collection  Time: 03/21/24 11:45 AM  Result Value Ref Range   Creatinine, Urine 213 mg/dL   Total Protein, Urine 29 mg/dL   Protein Creatinine Ratio 0.14 0.00 - 0.15 mg/mg[Cre]    Imaging:  No results found.  MDM & MAU COURSE  MDM: High  MAU Course: -Elevated BP in MAU, continuous BP monitoring. -CMP, CBC, urine protein/creatinine ratio to rule out preeclampsia.  -Headache remains 6/10. Discussed option of trying new medication or new combination of medications, patient declines. She is frustrated with taking medication without relief and does not want to take medicine for no reason. She states that this headache is normal in pregnancy for her, she gets this constant intractable headache during every pregnancy.  -Discussed head imaging to rule out structural causes of headache, patient declines. She reports she has had thorough neuro workup in the past without any positive findings. -She has had preeclampsia her last four pregnancies, only her first term delivery was without preeclampsia. -Hypokalemia on CMP, give PO potassium chloride . -Will get BPP and follow up US  here since patient will miss her US  appointment at 2pm. -Discussed with Dr. Eveline. Given uncontrolled cHTN with >2 severe range pressures on separate occasions as well as intractable headache, recommends admission. Will administer betamethasone  x1 and magnesium  sulfate and transfer to [facility]. -Dr. Cresenzo initiated transfer, accepting physician Dr. Burnard Gutta. -[facility] to transfer patient.   Orders Placed This Encounter  Procedures   US  MFM FETAL BPP WO NON STRESS   US  MFM OB FOLLOW UP   US  MFM UA CORD DOPPLER   CBC   Comprehensive metabolic panel with GFR   Protein / creatinine ratio, urine   Notify physician (specify) Confirmatory  reading of BP> 160/110 15 minutes later   Apply Hypertensive Disorders of Pregnancy Care Plan   Measure blood pressure   Strict intake and output   Meds ordered this  encounter  Medications   AND Linked Order Group    hydrALAZINE  (APRESOLINE ) injection 5 mg    hydrALAZINE  (APRESOLINE ) injection 10 mg    labetalol  (NORMODYNE ) injection 20 mg    labetalol  (NORMODYNE ) injection 40 mg   potassium chloride  SA (KLOR-CON  M) CR tablet 40 mEq   betamethasone  acetate-betamethasone  sodium phosphate  (CELESTONE ) injection 12 mg   DISCONTD: magnesium  sulfate IVPB 4 g 100 mL   magnesium  bolus via infusion 4 g   magnesium  sulfate 40 grams in SWI 1000 mL OB infusion   lactated ringers  infusion    ASSESSMENT   1. Chronic hypertension with superimposed preeclampsia   2. [redacted] weeks gestation of pregnancy     PLAN  Transfer to [facility] for magnesium  sulfate, second dose of betamethasone , and delivery.   Allergies as of 03/14/2024   (No Known Allergies)    Joesph DELENA Sear, PA

## 2024-03-21 NOTE — Progress Notes (Signed)
 PRENATAL VISIT NOTE  Subjective:  Brittany [name] is a 43 y.o. [id number] at [redacted]w[redacted]d being seen today for ongoing prenatal care.  She is currently monitored for the following issues for this high-risk pregnancy and has Preexisting hypertension complicating pregnancy, antepartum; Vitamin D  deficiency; History of severe preeclampsia in prior pregnancies; AMA (advanced maternal age) multigravida 80+; Supervision of high risk pregnancy, antepartum; and Gestational diabetes on their problem list.  Patient reports continued headache, currently 7/10 on pain scale. Was not helped by initiation of Imitrex  added to other interventions.  No blurry visiion, scotomata, RUQ pain.  Has been to MAU several times recently for this headache.  Contractions: Not present. Vag. Bleeding: None.  Movement: Present. Denies leaking of fluid.   The following portions of the patient's history were reviewed and updated as appropriate: allergies, current medications, past family history, past medical history, past social history, past surgical history and problem list.   Objective:    Vitals:   03/21/24 0914 03/21/24 0951  BP: (!) 153/101 (!) 170/108  Pulse: 94 86  Weight: 176 lb (79.8 kg)     Fetal Status:  Fetal Heart Rate (bpm): 143   Movement: Present    General: Alert, oriented and cooperative. Patient is in no acute distress.  Skin: Skin is warm and dry. No rash noted.   Cardiovascular: Normal heart rate noted  Respiratory: Normal respiratory effort, no problems with respiration noted  Abdomen: Soft, gravid, appropriate for gestational age.  Pain/Pressure: Absent     Pelvic: Cervical exam deferred        Extremities: Normal range of motion.  Edema: None  Mental Status: Normal mood and affect. Normal behavior. Normal judgment and thought content.   Assessment and Plan:  Pregnancy: [id number] at [redacted]w[redacted]d 1. Preexisting hypertension complicating pregnancy, antepartum (Primary) 2. Headache in pregnancy, antepartum,  third trimester 3. History of severe preeclampsia in prior pregnancies Patient has taken her BP meds at 0715.   Advised to go to MAU for further evaluation given concern for severe preeclampsia, also may need imaging of her head (this has not been done yet).  MAU staff notified.  Patient is aware of the closed NICU situation at [facility], and the possible need for transferring to another institution if admission is needed. - Comprehensive metabolic panel with GFR - Protein / creatinine ratio, urine - CBC  4. Diet controlled gestational diabetes mellitus (GDM) in third trimester Did not bring log today, but reports normal range Will bring log next visit.  5. [redacted] weeks gestation of pregnancy 6. Supervision of high risk pregnancy, antepartum Preterm labor symptoms and general obstetric precautions including but not limited to vaginal bleeding, contractions, leaking of fluid and fetal movement were reviewed in detail with the patient. Please refer to After Visit Summary for other counseling recommendations.   Return in about 2 weeks (around 04/04/2024) for OFFICE OB VISIT (MD only).  Future Appointments  Date Time Provider Department Center  03/21/2024 11:00 AM Tobb, Dub, DO [facility]  03/21/2024  2:00 PM [facility] PROVIDER 1 [facility]  03/21/2024  2:15 PM [facility] US4 [facility]  03/28/2024  1:10 PM [facility] NST [facility]  04/03/2024  1:10 PM [facility] NST [facility]  04/03/2024  1:50 PM Izell Harari, MD [facility]  04/04/2024 11:15 AM [facility]  04/05/2024 10:15 AM Teague Gretta Darice BRAVO, PA-C [facility]  04/11/2024  1:10 PM [facility] NST [facility]  04/18/2024  2:00 PM [facility] PROVIDER 1 [facility]  04/18/2024  2:15 PM [facility] US1 [facility]  04/25/2024  1:10 PM [facility] NST [facility]  05/02/2024  1:10 PM [facility] NST [facility]    Gloris Hugger, MD

## 2024-03-21 NOTE — H&P (Signed)
 ------------------------------------------------------------------------------- Attestation signed by Alfonso GORMAN Guan, MD at 03/25/2024 10:22 AM I examined the patient, reviewed the information provided, and agree with the treatment plan documented.  I spent 50 minutes with patient; more than 50% of the time was spent counseling patient and arranging treatment.     -------------------------------------------------------------------------------  History & Physical   Service: Obstetrics  Chief Complaint: Tx from [facility] for CHTN, c/f SIPE w/ SF  Clinic: [facility]  Diagnoses: Intrauterine pregnancy CHTN Vitamin D  deficiency Hx of PreE AMA A1GDM   Gestational Age: [redacted]w[redacted]d Dating Criteria: early ultrasound   Assessment & Plan    Brittany [name] is a 43 y.o. [id number] being treated for:    #CHTN exacerbation vs SIPE w/ SF - Patient w/ history of known CHTN. Seen in clinic today where she was noted to have a SRBP that repeated to HMR. She also had a HA at that time so she was sent to [facility] for evaluation.  - At OSH, she was noted to have MRBPs as well as a HA. She received her first BMZ and was intiated on MgSo4. She did not receive HA treatment at OSH - Labs at OSH: UPC 140, Cr 0.71, AST/ALT 31/20, Hgb 11.6, plt 241 - She was then transferred to [facility] for NICU proximity   - Upon arrival, one SRBP noted, otherwise MR.  - Endorses 6/10 HA. Denies visual changes, CP/SOB, or RUQ pain - Exam: 2+ DTRs, no beats clonus, no LE edema - Hgb 12.3, plt 244, Cr 0.36, AST/ALT 35/16, UPC 257, Uric Acid normal, LDH 378 (moderate hemolysis) - sFLT pending - Home regimen: Amlodipine  5mg , and Labetalol  300mg  TID - Triage interventions: Tylenol  1000mg , Flexeril  10mg , Reglan  10mg  IV, Benadryl  25mg  IV Assessment: suspect CHTN exacerbation vs SIPE w/ SF Plan: - Admit to antepartum for BP observation - Re-initiate MgSo4 - BMZ #2 ordered - Continue Amlodipine  5mg  - Increase to Labetalol  400mg  TID  #Nasal  congestion #Rhinorrhea #Sore throat - Complaining of 2 days of symptoms - Her child is sick - RVP pending   #Vitamin D  deficiency - On supplementation  #Hx of PreE #Hx of PTD - Reports PreE in all prior pregnancies  - Term deliveries with G6 and G8 - PTD with G7, G9 and G10 due to PreE per patient  #AMA - Declined NIPT  #A1GDM  - Reports checking her FSBS fasting and intermittently after meals (2hr PP) - Patient reports reasonable glycemic control (90s-120) - Fasting and 2hrPP FSBS ordered  #Fetal Wellbeing  -Fetal Monitoring: Continuous while on MgSo4 -Steroids: #1 given at OSH (9/4 @ 1314), #2 ordered  -NICU Consult: N/A -Group B Streptococcus: Not collected -Presentation: Cephalic by bedside ultrasound   #Maternal Considerations -Blood Type: PENDING -DVT Prophylaxis: SCD   #Postpartum Planning -Infant feeding: Breast -Contraception: Nexplanon    Disposition: Admit to Antepartum  Subjective    Brittany [name] presents as a transfer from [facility] for CHTN, c/f SIPE w/ SF. Patient was seen for a prenatal today where she was noted to have SRBP (170/108). Patient reported she took her BP meds today at 0715. Patient also had a HA today. She was sent from her PNV to [facility]'s OB ED. There she reported a 6/10 HA, BPs were MR. She was given BMZ #1 and initiated on MgSo4. She did not receive any treatment for her HA at OSH.  She was then transferred to [facility] for PreE r/o since [facility]'s NICU is currently closed.   Also reports nasal congestion, sore throat and rhinorrhea x2  days. Reports her child is sick. Denies subjective fevers or myalgias. States she tested negative for COVID.    OB Review of Systems: Fetal movement: Active Vaginal bleeding: No Loss of fluid: No Contractions: No   Preeclampsia Symptoms Review: Headaches: Yes, 6/10. Vision changes: No Chest pain and/or shortness of breath: No RUQ pain: No  Review of Systems: A complete review of systems was performed with  pertinent positives as above.   Objective   Temp:  [97.5 F (36.4 C)] 97.5 F (36.4 C) Heart Rate:  [90-96] 90 Resp:  [16] 16 BP: (122-155)/(93-100) 136/93   Physical Exam: GENERAL: Alert, no acute distress CHEST: No increased work of breathing CV: Regular rate ABDOMEN: Gravid, soft, nontender EXTREMITIES:  Warm and well-perfused, non-tender, no LE edema NEURO: CN II-XII grossly intact, 2+ DTRs, no clonus SVE: deferred SSE: deferred  Obstetric ultrasounds not available for review in Epic.  Lab results and other imaging reviewed in Epic.  History   OB History  Gravida Para Term Preterm AB Living  11 5 2 3 5 5   SAB IAB Ectopic Molar Multiple Live Births  4 1    5     # Outcome Date GA Lbr Len/2nd Weight Sex Type Anes PTL Lv  11 Current           10 Preterm 12/13/22 [redacted]w[redacted]d   F Vag-Spont   LIV  9 Preterm 06/13/18 [redacted]w[redacted]d   F Vag-Spont   LIV  8 Term 05/03/16 [redacted]w[redacted]d   M Vag-Spont   LIV  7 Preterm 04/06/11 [redacted]w[redacted]d   F Vag-Spont   LIV  6 Term 10/21/05 [redacted]w[redacted]d   M Vag-Spont   LIV  5 IAB           4 SAB           3 SAB           2 SAB           1 SAB             Medical History[1]  Surgical History[2]  Coding for today's visit was reached by Medical Decision Making (MDM).  Sharyne Dunks, MD PGY-2 Obstetrics & Gynecology 03/21/24 7:25 PM        [1] Past Medical History: Diagnosis Date  . Anemia   . Cervical dysplasia    hx of a LEEP  . Diabetes mellitus    (CMD)   . Gallstone   . Heart murmur   . Hypertension   . Migraine   . Subclinical hyperthyroidism   [2] Past Surgical History: Procedure Laterality Date  . CERVICAL BIOPSY  W/ LOOP ELECTRODE EXCISION    . DILATION AND CURETTAGE OF UTERUS

## 2024-03-21 NOTE — MAU Note (Signed)
 Brittany [name] is a 43 y.o. at [redacted]w[redacted]d here in MAU reporting: Elevated BP and Headache in office today, sent for pre-E work up.  LMP: 08/11/23 Onset of /complaint: today Pain score: 6/10 Headache There were no vitals filed for this visit.  134/104, 99% on Room Air, 82BPM, 97.71f Orally FHT: 150  Lab orders placed from triage: none

## 2024-03-22 LAB — CBC
Hematocrit: 36.4 % (ref 34.0–46.6)
Hemoglobin: 11.6 g/dL (ref 11.1–15.9)
MCH: 27.7 pg (ref 26.6–33.0)
MCHC: 31.9 g/dL (ref 31.5–35.7)
MCV: 87 fL (ref 79–97)
Platelets: 246 x10E3/uL (ref 150–450)
RBC: 4.19 x10E6/uL (ref 3.77–5.28)
RDW: 12.9 % (ref 11.7–15.4)
WBC: 8.4 x10E3/uL (ref 3.4–10.8)

## 2024-03-22 LAB — COMPREHENSIVE METABOLIC PANEL WITH GFR
ALT: 18 IU/L (ref 0–32)
AST: 26 IU/L (ref 0–40)
Albumin: 3.6 g/dL — ABNORMAL LOW (ref 3.9–4.9)
Alkaline Phosphatase: 114 IU/L (ref 44–121)
BUN/Creatinine Ratio: 10 (ref 9–23)
BUN: 6 mg/dL (ref 6–24)
Bilirubin Total: 0.3 mg/dL (ref 0.0–1.2)
CO2: 21 mmol/L (ref 20–29)
Calcium: 8.7 mg/dL (ref 8.7–10.2)
Chloride: 101 mmol/L (ref 96–106)
Creatinine, Ser: 0.61 mg/dL (ref 0.57–1.00)
Globulin, Total: 3.3 g/dL (ref 1.5–4.5)
Glucose: 68 mg/dL — ABNORMAL LOW (ref 70–99)
Potassium: 3.7 mmol/L (ref 3.5–5.2)
Sodium: 137 mmol/L (ref 134–144)
Total Protein: 6.9 g/dL (ref 6.0–8.5)
eGFR: 115 mL/min/1.73 (ref >59)

## 2024-03-22 LAB — PROTEIN / CREATININE RATIO, URINE
Creatinine, Urine: 196.8 mg/dL
Protein, Ur: 35 mg/dL
Protein/Creat Ratio: 178 mg/g{creat} (ref 0–200)

## 2024-03-23 DIAGNOSIS — O1490 Unspecified pre-eclampsia, unspecified trimester: Secondary | ICD-10-CM

## 2024-03-23 DIAGNOSIS — O24419 Gestational diabetes mellitus in pregnancy, unspecified control: Secondary | ICD-10-CM

## 2024-03-23 DIAGNOSIS — O1410 Severe pre-eclampsia, unspecified trimester: Secondary | ICD-10-CM

## 2024-03-23 NOTE — Lactation Note (Signed)
   Brittany [name], is at postpartum 4 hours following a Vaginal, Spontaneous delivery at [redacted]w[redacted]d resulting in [id number]   Lactation Consultation Lactation Consultation Reason for Consult: Initial assessment, NICU baby, Infant < 6lbs Feeding Plan: Exclusive breastfeed  Feeding Information  Infant NPO  Breastfeeding  MOB desires Breastfeeding.  Lactation at the bedside to assess feeding plan. Mom would like to provide breast milk for her baby and if possible breastfeed. Helped and explained how to set up breast pump with parts. Showed mom how to use initiate mode and educated on how long to use for (6 days or 20 ml's output). Observed mom pump with 21 mm flanges and these proved to be slightly large on the right breast. LC provided mom with 18 mm flanges to use during session. Discussed the need to pump every 2-3 hours (8-10 times in a 24 hour day) as breast stimulation is needed to establish supply. Mom pumped out drops of colostrum with this pump session. [facility] [place] taught mom how to hand express breast milk and mom did this and was able to get a few more drops of milk.  Discussed breast care- supportive bra that is not constrictive nor has under wire. LC showed mom how to clean pump parts. Discussed the need for an extra 500 calories a day to support breast milk supply and to drink to thirst. Provided mom with NICU cooler bag and breast milk diary, discussing the contents of both. Went over breast milk storage, labeling and transportation to the NICU. LC encouraged skin to skin contact with baby as soon as possible. Provided lanolin and discussed the uses of it. LC encouraged mom pump every 2-3 hours and to contact lactation with any breast related questions or concerns.   Maternal Information Maternal Information Has mother breastfed before?: Yes How long did the mother previously breastfeed?: 1 year each with all other 5 children Previous Maternal Breastfeeding Challenges: None Breast Trauma/Surgery/History:  N/A Breastfeeding Maternal Risk Factors: Epidural Breastfeeding Baby Risk Factors: Transition in ICN or SCN Infant to breast within first hour of birth?: No Breastfeeding Delayed Due to: Infant status Exclusive Pump and Bottle Feed: No (utilizing breast pump until infant can latch)  Breasts/Nipples  Breasts/Nipples Type of Breast Assessment: Prior to Feeding/Pumping General Breast Assessment: Pendulous Left Breast: Large breasts, Soft Left Nipple: Everted after stimulation Right Breast: Large breasts, Soft Right Nipple: Everted after stimulation Intervention: Hand expression, Breast pump, Syringe/dropper/spoon/cup Breastfeeding Status: Yes Breastfeeding Initiated: No (puming initiated)   Assessment Assessment Reminders: Nurse or pump 8-12x/24 hours, Switch and stimulate both breasts Instruct Mom on:: Breast care, Breast stimulation, Breast massage, Cleaning of pump, Frequency of feedings, Hand expression, How to call for help, Milk-collection, Milk-labeling, Milk-transport, Milk production expectations, Output monitoring, Use of pump Lactation Interventions: Education and Support, Breastfeed pump kit given, Pump, Set- up of pump, Sized Flanges, Syringe, Lanolin, Hand expression, Colostrum collector, Bottle     Skin to Skin Skin to Skin Is infant separated from mom?: No   Other OB Lactation Tools Other OB Lactation Tools Lactation Tools: Syringe, Bottle, Flanges, Pump, Lanolin   Breast Pump Breast Pump Pump: Electric (Symphony) Pump Review/Education: Setup, frequency, and cleaning, Milk storage Initiated by: C. Mott RNC IBCLC Date Initiated: 03/23/24 Flange Sizes: Right-18 mm, Left-21 mm   Patient Follow-up Needed Patient Follow-Up Lactation Follow-up Needed: Follow-up Follow-Up Type: Inpatient

## 2024-03-24 ENCOUNTER — Encounter: Payer: Self-pay | Admitting: Obstetrics & Gynecology

## 2024-03-25 ENCOUNTER — Encounter: Payer: Self-pay | Admitting: *Deleted

## 2024-03-25 NOTE — Care Plan (Signed)
 Problem: Blood Pressure Management Goal: Patient will remain at or below target blood pressure Description: 1. Establish a target blood pressure with patient and PCP 2. Discuss steps to manage BP with patient 3. Educate patient/family on monitoring blood pressure and interventions  4. Educate patient/family on intervention related to blood pressure management 5. Administer medications and provide pharmacologic support per provider order  Outcome: Progressing   Problem: Coping Goal: Patient verbalizes fears and concerns related to their condition Description: 1. Provide emotional support to establish a trusting relationship 2. Allow the patient/family to voice fears 3. Identify culturally appropriate resources and support systems  Outcome: Progressing   Problem: Fluid Volume Goal: Prevent fluid retention Description: 1. Assess and document edema 2.  Monitor intake and output 3.  Monitor for signs and symptoms of fluid overload 4.  Assess lung sounds, respiratory rate, and effort 5.  Assess lab values such as increased sodium levels, decreased potassium, hematocrit and hemoglobin. Monitor renal values that show evidence of fluid retention: BUN, creatinine, urine specific gravity.  Outcome: Progressing   Problem: Health Behavior Goal: Patient verbalizes understanding of disease process and appropriate treatment plan Description: 1. Discuss signs/symptoms of increased hypertension severity 2. Educate patient/family on postpartum warning signs   Outcome: Progressing Goal: Patient Safety Description: 1. Assess for CNS involvement;  these symptoms include headache, irritability, visual disturbances, and alterations in level of consciousness 2. Assess deep tendon reflexes and clonus 3. Assess for presence of epigastric, right upper quadrant pain  Outcome: Progressing Goal: Establish measures to lessen the likelihood of seizures Description: 1. Keep room quiet and dimly lit 2.  Limit visitors 3. Promote rest 4. Cluster care  Outcome: Progressing   Problem: Health Behavior: Goal: MCB Ability to state ways to decrease the risk of falls will be met by discharge Description: Ability to state ways to decrease the risk of falls will improve by discharge Outcome: Progressing   Problem: Safety: Goal: Will remain free from falls by discharge Description: Will remain free from falls by discharge Outcome: Progressing   Problem: POSTPARTUM Goal: Experiences normal postpartum course Description: INTERVENTIONS: 1. Monitor maternal vital signs 2. Assess uterine involution Outcome: Progressing Goal: Appropriate maternal - newborn bonding Description: INTERVENTIONS: 1. Identify family support 2. Referral to social worker or case manager as needed Outcome: Progressing Goal: Establishment of infant feeding pattern Description: INTERVENTIONS: 1. Assess breast/bottle feeding 2. Refer to lactation as needed Outcome: Progressing Goal: Incisions, wounds, or drain sites healing without S/S of infection Description: INTERVENTIONS 1. ADMISSION & EVERY SHIFT: Assess and document risk factors for pressure ulcer development 2. EVERY SHIFT: Assess and document skin integrity 3. EVERY SHIFT: Assess and document dressing/incision, wound bed, drain sites and surrounding tissue 4. Implement wound care per orders 5. Initiate isolation precautions as appropriate 6. Initiate Pressure Ulcer prevention bundle as indicated Outcome: Progressing   Problem: PAIN - ADULT Goal: Verbalizes/displays adequate comfort level or baseline comfort level Description: INTERVENTIONS: 1. Encourage pt to monitor pain and request assistance 2. Assess pain using appropriate pain scale 3. Administer analgesics based on type and severity of pain and evaluate response 4. Implement non-pharmacological measures as appropriate and evaluate response 5. Consider cultural and social influences on pain and pain  management 6. Notify LIP if interventions unsuccessful or patient reports new pain Outcome: Progressing   Problem: INFECTION - ADULT Goal: Absence of infection during hospitalization Description: INTERVENTIONS: 1. Assess and monitor for signs and symptoms of infection 2. Monitor lab/diagnostic results 3. Monitor all insertion sites i.e.,  indwelling lines, tubes and drains 4. Monitor endotracheal (as able) and nasal secretions for changes in amount and color 5. Institute appropriate cooling/warming therapies per order 6. Administer medications as ordered 7. Instruct and encourage patient and family to use good hand hygiene technique 8. Identify and instruct in appropriate isolation precautions for identified infection/condition Outcome: Progressing Goal: Absence of fever/infection during anticipated neutropenic period Description: INTERVENTIONS 1. Monitor WBC 2. Administer growth factors as ordered 3. Implement neutropenic guidelines as ordered Outcome: Progressing   Problem: Safety - Adult Goal: Free from fall injury Description: INTERVENTIONS: 1. Assess pt frequently for physical needs 2. Identify cognitive and physical deficits and behaviors that affect risk of falls. 3. Institute fall precautions as indicated by assessment. 4. Educate pt/family on patient safety including physical limitations 5. Instruct pt to call for assistance with activity based on assessment 6. Modify environment to reduce risk of injury 7. Consider OT/PT consult to assist with strengthening/mobility Outcome: Progressing Goal: Absence of infection during hospitalization Description: INTERVENTIONS: 1. Assess and monitor for signs and symptoms of infection 2. Monitor lab/diagnostic results 3. Monitor all insertion sites i.e., indwelling lines, tubes and drains 4. Monitor endotracheal (as able) and nasal secretions for changes in amount and color 5. Institute appropriate cooling/warming therapies per  order 6. Administer medications as ordered 7. Instruct and encourage patient and family to use good hand hygiene technique 8. Identify and instruct in appropriate isolation precautions for identified infection/condition Outcome: Progressing   Problem: DISCHARGE PLANNING Goal: Discharge to home or other facility with appropriate resources Description: INTERVENTIONS: 1. Identify barriers to discharge w/pt and caregiver 2. Arrange for needed discharge resources and transportation as appropriate 3. Identify discharge learning needs (meds, wound care, etc) 4. Arrange for interpreters to assist at discharge as needed 5. Refer to Case Management Department for coordinating discharge planning if the patient needs post-hospital services based on physician order or complex needs related to functional status, cognitive ability or social support system Outcome: Progressing   Problem: Chronic Conditions and Co-Morbidities Goal: Patient's chronic conditions and co-morbidity symptoms are monitored and maintained or improved Description: INTERVENTIONS: 1. Monitor and assess patient's chronic conditions and comorbid symptoms for stability, deterioration, or improvement 2. Collaborate with multidisciplinary team to address chronic and comorbid conditions and prevent exacerbation or deterioration 3. Update acute care plan with appropriate goals if chronic or comorbid symptoms are exacerbated and prevent overall improvement and discharge Outcome: Progressing   Problem: Pain Goal: Improvement in pain assessment Outcome: Progressing   Problem: Knowledge Deficit Goal: Patient/family/caregiver demonstrates understanding of disease process, treatment plan, medications, and discharge instructions Description: Complete learning assessment and assess knowledge base. Outcome: Progressing   Problem: Compromised Skin Integrity Goal: Skin integrity is maintained or improved Description: Assess and monitor skin  integrity. Identify patients at risk for skin breakdown on admission and per policy. Collaborate with interdisciplinary team and initiate plans and interventions as needed.    Outcome: Progressing Goal: Fluid and electrolyte balance are achieved/maintained Description: Assess and monitor vital signs (orthostatic vitals if applicable), fluid intake and output, urine color, labs, skin turgor, mucous membranes, jugular venous distention, edema, circumference of edematous extremities and abdominal girth, respiratory status, and mental status.  Monitor for signs and symptoms of hypovolemia (tachycardia, rapid breathing, decreased urine output, postural hypotension, confusion, syncope).  Monitor for signs and symptoms of hypervolemia (strong rapid pulse, shortness of breath, difficulty breathing lying down, crackles heard in lung fields, edema). Collaborate with interdisciplinary team and initiate plan and interventions as ordered. Outcome:  Progressing Goal: Nutritional status is improving Description: Monitor and assess patient for malnutrition (ex- brittle hair, bruises, dry skin, pale skin and conjunctiva, muscle wasting, smooth red tongue, and disorientation). Collaborate with interdisciplinary team and initiate plan and interventions as ordered.  Monitor patient's weight and dietary intake as ordered or per policy. Utilize nutrition screening tool and intervene per policy. Determine patient's food preferences and provide high-protein, high-caloric foods as appropriate.  Outcome: Progressing   Problem: Urinary Incontinence Goal: Perineal skin integrity is maintained or improved Description: Assess genitourinary system, perineal skin, labs (urinalysis), and history of incontinence to include past management, aggravating, and alleviating factors.  Collaborate with interdisciplinary team and initiate plans and interventions as needed. Outcome: Progressing

## 2024-03-28 ENCOUNTER — Other Ambulatory Visit: Payer: PRIVATE HEALTH INSURANCE

## 2024-03-29 ENCOUNTER — Other Ambulatory Visit: Payer: Self-pay | Admitting: Family

## 2024-03-29 DIAGNOSIS — Z1231 Encounter for screening mammogram for malignant neoplasm of breast: Secondary | ICD-10-CM

## 2024-04-03 ENCOUNTER — Encounter: Payer: PRIVATE HEALTH INSURANCE | Admitting: Obstetrics and Gynecology

## 2024-04-03 ENCOUNTER — Other Ambulatory Visit: Payer: PRIVATE HEALTH INSURANCE

## 2024-04-04 ENCOUNTER — Ambulatory Visit

## 2024-04-04 ENCOUNTER — Other Ambulatory Visit

## 2024-04-05 ENCOUNTER — Ambulatory Visit (INDEPENDENT_AMBULATORY_CARE_PROVIDER_SITE_OTHER)

## 2024-04-05 ENCOUNTER — Ambulatory Visit (INDEPENDENT_AMBULATORY_CARE_PROVIDER_SITE_OTHER): Admitting: Physician Assistant

## 2024-04-05 ENCOUNTER — Encounter: Payer: Self-pay | Admitting: Physician Assistant

## 2024-04-05 ENCOUNTER — Other Ambulatory Visit: Payer: Self-pay | Admitting: Family Medicine

## 2024-04-05 VITALS — BP 163/99 | HR 78 | Wt 168.0 lb

## 2024-04-05 DIAGNOSIS — I10 Essential (primary) hypertension: Secondary | ICD-10-CM | POA: Diagnosis not present

## 2024-04-05 DIAGNOSIS — G43001 Migraine without aura, not intractable, with status migrainosus: Secondary | ICD-10-CM

## 2024-04-05 DIAGNOSIS — O119 Pre-existing hypertension with pre-eclampsia, unspecified trimester: Secondary | ICD-10-CM

## 2024-04-05 MED ORDER — ENALAPRIL MALEATE 5 MG PO TABS: 5.0000 mg | ORAL_TABLET | Freq: Every day | ORAL | 12 refills | Status: AC

## 2024-04-05 MED ORDER — NURTEC 75 MG PO TBDP: 75.0000 mg | ORAL_TABLET | ORAL | 2 refills | Status: AC

## 2024-04-05 MED ORDER — BUTALBITAL-ASPIRIN-CAFFEINE 50-325-40 MG PO CAPS: 1.0000 | ORAL_CAPSULE | Freq: Two times a day (BID) | ORAL | 1 refills | Status: AC | PRN

## 2024-04-05 MED ORDER — AMLODIPINE BESYLATE 10 MG PO TABS: 10.0000 mg | ORAL_TABLET | Freq: Every day | ORAL | 12 refills | Status: AC

## 2024-04-05 NOTE — Progress Notes (Signed)
 Subjective:  Brittany [name] is a [id number] here for BP check.  She is 1wk6days  postpartum following a normal spontaneous vaginal delivery. Notes was not able to take B/P Rx yet due to getting kids ready for school and trying to make this appt.   Hypertension ROS: Patient has headache   Objective:  LMP 08/11/2023   Appearance alert, well appearing, and in no distress.  Assessment:   Blood Pressure needs improvement.   Plan:  Per Dr.Newton B/P Rx change  F/U early next week with B/P check however pt advised if readings are still elevated w/unresolved HA, any visual changes pt will have to go to [facility].SABRA   Brittany [name], CMA

## 2024-04-05 NOTE — Patient Instructions (Signed)
 Migraine Headache A migraine headache is a very strong throbbing pain on one or both sides of your head. This type of headache can also cause other symptoms. It can last from 4 hours to 3 days. Talk with your doctor about what things may bring on (trigger) this condition. What are the causes? The exact cause of a migraine is not known. This condition may be brought on or caused by: Smoking. Medicines, such as: Medicine used to treat chest pain (nitroglycerin). Birth control pills. Estrogen. Some blood pressure medicines. Certain substances in some foods or drinks. Foods and drinks, such as: Cheese. Chocolate. Alcohol. Caffeine. Doing physical activity that is very hard. Other things that may trigger a migraine headache include: Periods. Pregnancy. Hunger. Stress. Getting too much or too little sleep. Weather changes. Feeling tired (fatigue). What increases the risk? Being 18-65 years old. Being female. Having a family history of migraine headaches. Being Caucasian. Having a mental health condition, such as being sad (depressed) or feeling worried or nervous (anxious). Being very overweight (obese). What are the signs or symptoms? A throbbing pain. This pain may: Happen in any area of the head, such as on one or both sides. Make it hard to do daily activities. Get worse with physical activity. Get worse around bright lights, loud noises, or smells. Other symptoms may include: Feeling like you may vomit (nauseous). Vomiting. Dizziness. Before a migraine headache starts, you may get warning signs (an aura). An aura may include: Seeing flashing lights or having blind spots. Seeing bright spots, halos, or zigzag lines. Having tunnel vision or blurred vision. Having numbness or a tingling feeling. Having trouble talking. Having weak muscles. After a migraine ends, you may have symptoms. These may include: Tiredness. Trouble thinking (concentrating). How is this  treated? Taking medicines that: Relieve pain. Relieve the feeling like you may vomit. Prevent migraine headaches. Treatment may also include: Acupuncture. Lifestyle changes like avoiding foods that bring on migraine headaches. Learning ways to control your body functions (biofeedback). Therapy to help you know and deal with negative thoughts (cognitive behavioral therapy). Follow these instructions at home: Medicines Take over-the-counter and prescription medicines only as told by your doctor. If told, take steps to prevent problems with pooping (constipation). You may need to: Drink enough fluid to keep your pee (urine) pale yellow. Take medicines. You will be told what medicines to take. Eat foods that are high in fiber. These include beans, whole grains, and fresh fruits and vegetables. Limit foods that are high in fat and sugar. These include fried or sweet foods. Ask your doctor if you should avoid driving or using machines while you are taking your medicine. Lifestyle  Do not drink alcohol. Do not smoke or use any products that contain nicotine or tobacco. If you need help quitting, ask your doctor. Get 7-9 hours of sleep each night, or the amount recommended by your doctor. Find ways to deal with stress, such as meditation, deep breathing, or yoga. Try to exercise often. This can help lessen how bad and how often your migraines happen. General instructions Keep a journal to find out what may bring on your migraine headaches. This can help you avoid those things. For example, write down: What you eat and drink. How much sleep you get. Any change to your medicines or diet. If you have a migraine headache: Avoid things that make your symptoms worse, such as bright lights. Lie down in a dark, quiet room. Do not drive or use machinery. Ask your  doctor what activities are safe for you. Where to find more information Coalition for Headache and Migraine Patients (CHAMP):  headachemigraine.org American Migraine Foundation: americanmigrainefoundation.org National Headache Foundation: headaches.org Contact a doctor if: You get a migraine headache that is different or worse than others you have had. You have more than 15 days of headaches in one month. Get help right away if: Your migraine headache gets very bad. Your migraine headache lasts more than 72 hours. You have a fever or stiff neck. You have trouble seeing. Your muscles feel weak or like you cannot control them. You lose your balance a lot. You have trouble walking. You faint. You have a seizure. This information is not intended to replace advice given to you by your health care provider. Make sure you discuss any questions you have with your health care provider. Document Revised: 02/28/2022 Document Reviewed: 02/28/2022 Elsevier Patient Education  2024 [employer].

## 2024-04-05 NOTE — Progress Notes (Signed)
 Elevated B/P pt has not been able to take her B/P Rx just yet.   Takes Excedrin  rfor relief feels as if HA is no both sides of the head, no visual changes.    History:  Brittany [name] is a 43 y.o. [id number] who presents to clinic today for first eval of headache.   She delivered baby #6 less than 2 weeks ago, has chronic HTN and struggled with BP throughout the pregnancy.  Today she has not taken her medication and she states taking it 3 times a day is hard for her.  She also notes she has very elevated levels of stress.  Her 46 year old son is giving her a hard time in addition to have a newborn in NICU in [place] and taking care of the 5 and 1 year olds.   She first started getting headaches at the age of 75.  The recent headache is left frontal but it can move to the right  The pain is severe.  There is throbbing and it can last all day.  Movement makes it worse.  No nausea or vomiting  Lights and noises make it worse.  During the worst part of the pain she also sees spots but never before the pain begins.    Sleep helps the pain improve.  She did not get benefit from flexeril .  She has not taken sumatriptan  as prescribed last month.  She is pumping breastmilk and then delivering to the NICU for them to feed her daughter.  Number of days in the last 4 weeks with:  Severe headache: 28 Moderate headache: 0 Mild headache: 0  No headache: 0   Past Medical History:  Diagnosis Date   Anemia 11/30/2016   Cervical dysplasia    Had LEEP   Gallstone    Heart murmur    Hypertension    Migraine 03/18/2016   Needs referral to headache specialist   Murmur, cardiac 11/30/2016   Cardiology evaluation: normal 12/2017   Severe preeclampsia    Subclinical hyperthyroidism 12/09/2015   UTI (urinary tract infection)     Social History   Socioeconomic History   Marital status: Legally Separated    Spouse name: Not on file   Number of children: Not on file   Years of education: Not on file   Highest  education level: Not on file  Occupational History   Not on file  Tobacco Use   Smoking status: Never   Smokeless tobacco: Never  Vaping Use   Vaping status: Never Used  Substance and Sexual Activity   Alcohol use: Not Currently   Drug use: No   Sexual activity: Yes  Other Topics Concern   Not on file  Social History Narrative   Not on file   Social Drivers of Health   Financial Resource Strain: Not on file  Food Insecurity: No Food Insecurity (03/21/2024)   Hunger Vital Sign    Worried About Running Out of Food in the Last Year: Never true    Ran Out of Food in the Last Year: Never true  Transportation Needs: No Transportation Needs (03/21/2024)   PRAPARE - Administrator, Civil Service (Medical): No    Lack of Transportation (Non-Medical): No  Physical Activity: Not on file  Stress: Not on file  Social Connections: Not on file  Intimate Partner Violence: Not At Risk (03/21/2024)   Humiliation, Afraid, Rape, and Kick questionnaire    Fear of Current or Ex-Partner: No  Emotionally Abused: No    Physically Abused: No    Sexually Abused: No    Family History  Problem Relation Age of Onset   Asthma Mother    Hypertension Mother    CAD Mother    Asthma Maternal Grandmother    Hypertension Maternal Grandmother     No Known Allergies  Current Outpatient Medications on File Prior to Visit  Medication Sig Dispense Refill   Accu-Chek Softclix Lancets lancets 1 each by Other route 4 (four) times daily. 100 each 12   acetaminophen  (TYLENOL ) 500 MG tablet Take 2 tablets (1,000 mg total) by mouth every 6 (six) hours as needed. 100 tablet 2   acetaminophen -caffeine  (EXCEDRIN  TENSION HEADACHE) 500-65 MG TABS per tablet Take 2 tablets by mouth every 8 (eight) hours as needed (Headache). 60 tablet 0   amLODipine  (NORVASC ) 5 MG tablet Take 1 tablet (5 mg total) by mouth daily. 30 tablet 5   aspirin  EC 81 MG tablet Take 2 tablets (162 mg total) by mouth at bedtime. Start  taking when you are [redacted] weeks pregnant for rest of pregnancy for prevention of preeclampsia 300 tablet 2   Blood Glucose Monitoring Suppl (ACCU-CHEK GUIDE) w/Device KIT 1 Device by Does not apply route 4 (four) times daily. 1 kit 0   cyclobenzaprine  (FLEXERIL ) 10 MG tablet Take 1 tablet (10 mg total) by mouth 2 (two) times daily as needed for muscle spasms. 20 tablet 3   diphenhydrAMINE  (BENADRYL ) 25 mg capsule Take 1 capsule (25 mg total) by mouth every 6 (six) hours as needed (Headache). 30 capsule 0   glucose blood test strip 1 each by Other route in the morning, at noon, in the evening, and at bedtime. Use as instructed 100 each 12   labetalol  (NORMODYNE ) 300 MG tablet Take 2 tablets (600 mg total) by mouth 3 (three) times daily. 90 tablet 5   Magnesium  Oxide -Mg Supplement (MAG-OXIDE) 200 MG TABS Take 2 tablets (400 mg total) by mouth at bedtime. If that amount causes loose stools in the am, switch to 200mg  daily at bedtime. 60 tablet 3   Prenatal Vit-Fe Phos-FA-Omega (VITAFOL  GUMMIES) 3.33-0.333-34.8 MG CHEW Chew 1 tablet by mouth daily. 90 tablet 5   SUMAtriptan  (IMITREX ) 100 MG tablet Take 1 tablet (100 mg total) by mouth once as needed for up to 1 dose for migraine. May repeat in 2 hours if headache persists or recurs. 9 tablet 0   [DISCONTINUED] cetirizine  (ZYRTEC  ALLERGY) 10 MG tablet Take 1 tablet (10 mg total) by mouth daily. 30 tablet 2   [DISCONTINUED] ipratropium (ATROVENT ) 0.06 % nasal spray Place 2 sprays into both nostrils 4 (four) times daily. 15 mL 0   No current facility-administered medications on file prior to visit.     Review of Systems:  All pertinent positive/negative included in HPI, all other review of systems are negative   Objective:  Physical Exam BP (!) 163/99   Pulse 78   Wt 168 lb (76.2 kg)   LMP 08/11/2023   BMI 27.96 kg/m  CONSTITUTIONAL: Well-developed, well-nourished female in no acute distress.  EYES: EOM intact ENT: Normocephalic CARDIOVASCULAR:  Regular rate  RESPIRATORY: Normal rate.  MUSCULOSKELETAL: Normal ROM SKIN: Warm, dry without erythema  NEUROLOGICAL: Alert, oriented, CN II-XII grossly intact, Appropriate balance  PSYCH: Normal behavior, mood   Assessment & Plan:  Assessment: 1. Migraine without aura and with status migrainosus, not intractable   2. Chronic hypertension      Plan: Pt discussed with  Dr. Eldonna - see her note.  Pt will begin bp meds amlodipine  and enalapril  - both once daily as pt requested - in lieu of labetalol  which she was not able to use 3 times daily.  Then pt is to f/u for BP check in one week.  Pt may use Nurtec to treat HA - up to every other day.  She is to pump and dump breastmilk.  DO NOT use sumatriptan  due to uncontrolled HTN Fiorinal  for migraine rescue medication.  Limit use to 1-2 days per week. Walking outside for exercise is encouraged to help with stress/bp and headache.   Follow-up in 3 months or sooner PRN  Nance Gretta Darice FORBES, PA-C 04/05/2024 10:31 AM

## 2024-04-05 NOTE — Progress Notes (Signed)
 Patient with elevated blood pressures She is not taking labetalol  due to TID dosing and has never taken amlodipine    I have sent in a new RX   Meds ordered this encounter  Medications   amLODipine  (NORVASC ) 10 MG tablet    Sig: Take 1 tablet (10 mg total) by mouth daily.    Dispense:  30 tablet    Refill:  12   enalapril  (VASOTEC ) 5 MG tablet    Sig: Take 1 tablet (5 mg total) by mouth daily.    Dispense:  30 tablet    Refill:  12    - Recommend starting medications ASAP - Repeat BP check on meds next week - Will need basic metabolic panel at pp visit to check creatinine

## 2024-04-11 ENCOUNTER — Other Ambulatory Visit: Payer: PRIVATE HEALTH INSURANCE

## 2024-04-12 ENCOUNTER — Ambulatory Visit

## 2024-04-15 ENCOUNTER — Encounter: Payer: Self-pay | Admitting: *Deleted

## 2024-04-16 ENCOUNTER — Encounter

## 2024-04-16 DIAGNOSIS — Z1231 Encounter for screening mammogram for malignant neoplasm of breast: Secondary | ICD-10-CM

## 2024-04-18 ENCOUNTER — Ambulatory Visit: Payer: PRIVATE HEALTH INSURANCE

## 2024-04-24 ENCOUNTER — Encounter: Payer: Self-pay | Admitting: Radiology

## 2024-04-24 ENCOUNTER — Ambulatory Visit
Admission: RE | Admit: 2024-04-24 | Discharge: 2024-04-24 | Disposition: A | Discharge status: Home / Self Care | Source: Ambulatory Visit | Attending: Family | Admitting: Family

## 2024-04-24 DIAGNOSIS — Z1231 Encounter for screening mammogram for malignant neoplasm of breast: Secondary | ICD-10-CM

## 2024-04-25 ENCOUNTER — Other Ambulatory Visit: Payer: PRIVATE HEALTH INSURANCE

## 2024-05-01 ENCOUNTER — Encounter: Payer: Self-pay | Admitting: Cardiology

## 2024-05-02 ENCOUNTER — Other Ambulatory Visit: Payer: PRIVATE HEALTH INSURANCE

## 2024-05-09 ENCOUNTER — Ambulatory Visit: Admitting: Cardiology

## 2024-05-09 ENCOUNTER — Ambulatory Visit: Admitting: Obstetrics & Gynecology

## 2024-05-09 ENCOUNTER — Encounter: Payer: Self-pay | Admitting: Obstetrics & Gynecology

## 2024-05-09 VITALS — BP 146/87 | HR 76 | Ht 65.0 in | Wt 166.0 lb

## 2024-05-09 DIAGNOSIS — F32A Depression, unspecified: Secondary | ICD-10-CM

## 2024-05-09 DIAGNOSIS — O09299 Supervision of pregnancy with other poor reproductive or obstetric history, unspecified trimester: Secondary | ICD-10-CM

## 2024-05-09 DIAGNOSIS — O24419 Gestational diabetes mellitus in pregnancy, unspecified control: Secondary | ICD-10-CM

## 2024-05-09 MED ORDER — SERTRALINE HCL 50 MG PO TABS: 50.0000 mg | ORAL_TABLET | Freq: Every day | ORAL | 3 refills | Status: AC

## 2024-05-09 NOTE — Progress Notes (Signed)
 Post Partum Visit Note  Brittany [name] is a 43 y.o. [id number] female who presents for a postpartum visit. She is 6 weeks postpartum following a normal spontaneous vaginal delivery.  I have fully reviewed the prenatal and intrapartum course. The delivery was at 32 gestational weeks due to severe preeclampsia superimposed on chronic hypertension.  Anesthesia: epidural. She delivered at [facility], was transferred there due to [facility] NICU being full. Postpartum course has been well. Baby is doing well. Baby is feeding by breast. Bleeding staining only, pink, and red. Bowel function is normal. Bladder function is normal. Patient is sexually active. Contraception method is Nexplanon , already placed immediately postpartum. Postpartum depression screening: positive. She denies any plan to harm herself, denies any SI now.  Does not want to go talk to anyone, just wants to be started on medication.    Edinburgh Postnatal Depression Scale - 05/09/24 1357       Edinburgh Postnatal Depression Scale:  In the Past 7 Days   I have been able to laugh and see the funny side of things. 2    I have looked forward with enjoyment to things. 2    I have blamed myself unnecessarily when things went wrong. 3    I have been anxious or worried for no good reason. 3    I have felt scared or panicky for no good reason. 3    Things have been getting on top of me. 3    I have been so unhappy that I have had difficulty sleeping. 2    I have felt sad or miserable. 2    I have been so unhappy that I have been crying. 2    The thought of harming myself has occurred to me. 1    Edinburgh Postnatal Depression Scale Total 23          Health Maintenance Due  Topic Date Due   Pneumococcal Vaccine (1 of 2 - PCV) Never done   Hepatitis B Vaccines 19-59 Average Risk (1 of 3 - 19+ 3-dose series) Never done   HPV VACCINES (1 - 3-dose SCDM series) Never done   Influenza Vaccine  02/16/2024   COVID-19 Vaccine (1 - 2025-26 season)  Never done    The following portions of the patient's history were reviewed and updated as appropriate: allergies, current medications, past family history, past medical history, past social history, past surgical history, and problem list.  Review of Systems Pertinent items noted in HPI and remainder of comprehensive ROS otherwise negative.  Objective:  BP (!) 146/87   Pulse 76   Ht 5' 5 (1.651 m)   Wt 166 lb (75.3 kg)   LMP 08/11/2023   Breastfeeding Yes   BMI 27.62 kg/m    General:  alert, cooperative, and no distress   Breasts:  not indicated  Lungs: clear to auscultation bilaterally  Heart:  regular rate and rhythm  Abdomen: soft, non-tender; bowel sounds normal; no masses,  no organomegaly   GU exam:  not indicated       Assessment:   Patient is here for postpartum examination Plan:   Essential components of care per ACOG recommendations:  1.  Mood and well being: Patient with positive depression screening today. Reviewed local resources for support.  Advised to go to ER if she ever has SI, and urged her to go to walk-in hours at [facility] Zoloft  50 mg po daily prescribed, she will follow up in 2 weeks. - Patient  tobacco use? No.   - hx of drug use?No  2. Infant care and feeding:  -Patient currently breastmilk feeding? Yes. Reviewed importance of draining breast regularly to support lactation.  -Social determinants of health (SDOH) reviewed in EPIC. No concerns.  3. Sexuality, contraception and birth spacing - Patient does not want a pregnancy in the next year.  Desired family size is 6 children.  - Has Nexplanon  in place.   - Discussed birth spacing of 18 months  4. Sleep and fatigue -Encouraged family/partner/community support of 4 hrs of uninterrupted sleep to help with mood and fatigue  5. Physical Recovery  - Discussed patients delivery and complications. She describes her labor as mixed. - Patient had a Vaginal, no problems at delivery.  - Patient  has urinary incontinence? No. - Patient is safe to resume physical and sexual activity  6.  Health Maintenance - HM due items addressed Yes - Last pap smear  Diagnosis  Date Value Ref Range Status  02/10/2022   Final   - Negative for intraepithelial lesion or malignancy (NILM)   Pap smear not done at today's visit.  -Breast Cancer screening indicated? Had benign mammogram pm 04/24/2024  7. Chronic Disease/Pregnancy Condition follow up: Hypertension and Gestational Diabetes - Continue Norvasc  for HTN - Will return for postpartum 2 hr GTT - PCP follow up  Return in about 2 weeks (around 05/23/2024) for 2 hr postpartum GTT and depression follow up.   Gloris Hugger, MD [facility] [employer], [facility]

## 2024-05-14 ENCOUNTER — Encounter: Payer: Self-pay | Admitting: *Deleted

## 2024-05-16 ENCOUNTER — Ambulatory Visit: Attending: Cardiology | Admitting: Cardiology

## 2024-05-16 ENCOUNTER — Encounter: Payer: Self-pay | Admitting: Cardiology

## 2024-05-16 VITALS — BP 132/92 | HR 76 | Ht 65.0 in | Wt 166.1 lb

## 2024-05-16 DIAGNOSIS — O09299 Supervision of pregnancy with other poor reproductive or obstetric history, unspecified trimester: Secondary | ICD-10-CM | POA: Insufficient documentation

## 2024-05-16 DIAGNOSIS — I10 Essential (primary) hypertension: Secondary | ICD-10-CM | POA: Diagnosis present

## 2024-05-16 DIAGNOSIS — Z8632 Personal history of gestational diabetes: Secondary | ICD-10-CM | POA: Diagnosis present

## 2024-05-16 DIAGNOSIS — I119 Hypertensive heart disease without heart failure: Secondary | ICD-10-CM | POA: Insufficient documentation

## 2024-05-16 DIAGNOSIS — Z8759 Personal history of other complications of pregnancy, childbirth and the puerperium: Secondary | ICD-10-CM

## 2024-05-16 MED ORDER — HYDROCHLOROTHIAZIDE 12.5 MG PO CAPS: 12.5000 mg | ORAL_CAPSULE | Freq: Every day | ORAL | 3 refills | Status: AC

## 2024-05-16 NOTE — Patient Instructions (Signed)
 Medication Instructions:  Your physician has recommended you make the following change in your medication:  START: Hydrochlorothiazide  12.5 mg once daily   Please take your blood pressure daily for 2 weeks and send in a MyChart message. Please include heart rates. (One message at the end of the 2 weeks).   HOW TO TAKE YOUR BLOOD PRESSURE: Rest 5 minutes before taking your blood pressure. Don't smoke or drink caffeinated beverages for at least 30 minutes before. Take your blood pressure before (not after) you eat. Sit comfortably with your back supported and both feet on the floor (don't cross your legs). Elevate your arm to heart level on a table or a desk. Use the proper sized cuff. It should fit smoothly and snugly around your bare upper arm. There should be enough room to slip a fingertip under the cuff. The bottom edge of the cuff should be 1 inch above the crease of the elbow. Ideally, take 3 measurements at one sitting and record the average.  *If you need a refill on your cardiac medications before your next appointment, please call your pharmacy*  Testing/Procedures: Your physician has requested that you have an echocardiogram. Echocardiography is a painless test that uses sound waves to create images of your heart. It provides your doctor with information about the size and shape of your heart and how well your heart's chambers and valves are working. This procedure takes approximately one hour. There are no restrictions for this procedure. Please do NOT wear cologne, perfume, aftershave, or lotions (deodorant is allowed). Please arrive 15 minutes prior to your appointment time.  Please note: We ask at that you not bring children with you during ultrasound (echo/ vascular) testing. Due to room size and safety concerns, children are not allowed in the ultrasound rooms during exams. Our front office staff cannot provide observation of children in our lobby area while testing is being  conducted. An adult accompanying a patient to their appointment will only be allowed in the ultrasound room at the discretion of the ultrasound technician under special circumstances. We apologize for any inconvenience.   Follow-Up: At [facility], you and your health needs are our priority.  As part of our continuing mission to provide you with exceptional heart care, our providers are all part of one team.  This team includes your primary Cardiologist (physician) and Advanced Practice Providers or APPs (Physician Assistants and Nurse Practitioners) who all work together to provide you with the care you need, when you need it.  Your next appointment:   12 week(s)  Provider:   Kardie Tobb, DO

## 2024-05-16 NOTE — Progress Notes (Signed)
 Cardio-Obstetrics Clinic  New Evaluation  Date:  05/16/2024   ID:  Brittany [name], Done 20-Jan-1981, MRN [id number]  PCP:  Jason Leita Repine, FNP (Inactive)   [facility] Providers Cardiologist:  Dub Huntsman, DO  Electrophysiologist:  None       Referring MD: Herchel Gloris LABOR, MD   Chief Complaint:  I am doing   History of Present Illness:    Brittany [name] is a 43 y.o. female [[id number]] who is being seen today for the evaluation of chronic hypertension at the request of Anyanwu, Gloris LABOR, MD.   Medical history includes hypertension, gestational diabetes, history of severe preeclampsia and multiple prior pregnancies, subclinical hypothyroidism here today to be evaluated for hypertension.  Today she offers no complaints.  She tells me that she has been diagnosed with hypertension for many years now.  She has been on amlodipine  as well for many years.  Prior CV Studies Reviewed: The following studies were reviewed today: None   Past Medical History:  Diagnosis Date   Anemia 11/30/2016   Cervical dysplasia    Had LEEP   Gallstone    Gestational diabetes 02/23/2024   Heart murmur    History of severe preeclampsia in prior pregnancies 08/01/2022   Hypertension    Migraine 03/18/2016   Needs referral to headache specialist   Murmur, cardiac 11/30/2016   Cardiology evaluation: normal 12/2017   Severe preeclampsia    Subclinical hyperthyroidism 12/09/2015   UTI (urinary tract infection)     Past Surgical History:  Procedure Laterality Date   DILATION AND CURETTAGE OF UTERUS     LEEP        OB History     Gravida  11   Para  6   Term  2   Preterm  4   AB  5   Living  6      SAB  4   IAB  1   Ectopic  0   Multiple      Live Births  6               Current Medications: Current Meds  Medication Sig   acetaminophen  (TYLENOL ) 500 MG tablet Take 2 tablets (1,000 mg total) by mouth every 6 (six) hours as needed.    acetaminophen -caffeine  (EXCEDRIN  TENSION HEADACHE) 500-65 MG TABS per tablet Take 2 tablets by mouth every 8 (eight) hours as needed (Headache).   amLODipine  (NORVASC ) 10 MG tablet Take 1 tablet (10 mg total) by mouth daily.   butalbital -aspirin -caffeine  (FIORINAL ) 50-325-40 MG capsule Take 1 capsule by mouth 2 (two) times daily as needed for headache.   hydrochlorothiazide  (MICROZIDE ) 12.5 MG capsule Take 1 capsule (12.5 mg total) by mouth daily.     Allergies:   Patient has no known allergies.   Social History   Socioeconomic History   Marital status: Legally Separated    Spouse name: Not on file   Number of children: Not on file   Years of education: Not on file   Highest education level: Not on file  Occupational History   Not on file  Tobacco Use   Smoking status: Never   Smokeless tobacco: Never  Vaping Use   Vaping status: Never Used  Substance and Sexual Activity   Alcohol use: Not Currently   Drug use: No   Sexual activity: Yes  Other Topics Concern   Not on file  Social History Narrative   Not on file   Social Drivers of  Health   Financial Resource Strain: Not on file  Food Insecurity: No Food Insecurity (03/21/2024)   Hunger Vital Sign    Worried About Running Out of Food in the Last Year: Never true    Ran Out of Food in the Last Year: Never true  Transportation Needs: No Transportation Needs (03/21/2024)   PRAPARE - Administrator, Civil Service (Medical): No    Lack of Transportation (Non-Medical): No  Physical Activity: Not on file  Stress: Not on file  Social Connections: Not on file      Family History  Problem Relation Age of Onset   Asthma Mother    Hypertension Mother    CAD Mother    Asthma Maternal Grandmother    Hypertension Maternal Grandmother       ROS:   Please see the history of present illness.     All other systems reviewed and are negative.   Labs/EKG Reviewed:    EKG:  None today     Recent Labs: 11/09/2023:  TSH 0.012 03/09/2024: B Natriuretic Peptide 17.2 03/21/2024: ALT 20; BUN 5; Creatinine, Ser 0.71; Hemoglobin 11.6; Platelets 241; Potassium 3.2; Sodium 135   Recent Lipid Panel Lab Results  Component Value Date/Time   CHOL 138 07/30/2021 11:23 AM   TRIG 28.0 07/30/2021 11:23 AM   HDL 71.20 07/30/2021 11:23 AM   CHOLHDL 2 07/30/2021 11:23 AM   LDLCALC 61 07/30/2021 11:23 AM    Physical Exam:    VS:  BP (!) 132/92 (BP Location: Right Arm, Patient Position: Sitting, Cuff Size: Normal)   Pulse 76   Ht 5' 5 (1.651 m)   Wt 166 lb 1.6 oz (75.3 kg)   SpO2 97%   BMI 27.64 kg/m     Wt Readings from Last 3 Encounters:  05/16/24 166 lb 1.6 oz (75.3 kg)  05/09/24 166 lb (75.3 kg)  04/05/24 168 lb (76.2 kg)     GEN: Well nourished, well developed in no acute distress HEENT: Normal NECK: No JVD; No carotid bruits LYMPHATICS: No lymphadenopathy CARDIAC: RRR, no murmurs, rubs, gallops RESPIRATORY:  Clear to auscultation without rales, wheezing or rhonchi  ABDOMEN: Soft, non-tender, non-distended MUSCULOSKELETAL:  No edema; No deformity  SKIN: Warm and dry NEUROLOGIC:  Alert and oriented x 3 PSYCHIATRIC:  Normal affect    Risk Assessment/Risk Calculators:              ASSESSMENT & PLAN:    Chronic hypertension History of severe preeclampsia Gestational diabetes  Her blood pressure is elevated in the office today.  She is on amlodipine  10 mg daily.  I do think she can benefit from additional antihypertensive the get her down less than 130/80 mmHg.  Hydrochlorothiazide  12.5 mg would be the best option here.  She has had longstanding hypertension which from my chart review appears to be not appropriately controlled due to not having been optimized with antihypertensive medication.  Will get an echocardiogram to assess for hypertensive heart disease/structural abnormality especially for LVH and diastolic dysfunction.  We discussed the risk for cardiovascular disease in the  future given gestational diabetes as well as preeclampsia.  The patient is in agreement with the above plan. The patient left the office in stable condition.  The patient will follow up in  Patient Instructions  Medication Instructions:  Your physician has recommended you make the following change in your medication:  START: Hydrochlorothiazide  12.5 mg once daily   Please take your blood pressure daily for 2  weeks and send in a MyChart message. Please include heart rates. (One message at the end of the 2 weeks).   HOW TO TAKE YOUR BLOOD PRESSURE: Rest 5 minutes before taking your blood pressure. Don't smoke or drink caffeinated beverages for at least 30 minutes before. Take your blood pressure before (not after) you eat. Sit comfortably with your back supported and both feet on the floor (don't cross your legs). Elevate your arm to heart level on a table or a desk. Use the proper sized cuff. It should fit smoothly and snugly around your bare upper arm. There should be enough room to slip a fingertip under the cuff. The bottom edge of the cuff should be 1 inch above the crease of the elbow. Ideally, take 3 measurements at one sitting and record the average.  *If you need a refill on your cardiac medications before your next appointment, please call your pharmacy*  Testing/Procedures: Your physician has requested that you have an echocardiogram. Echocardiography is a painless test that uses sound waves to create images of your heart. It provides your doctor with information about the size and shape of your heart and how well your heart's chambers and valves are working. This procedure takes approximately one hour. There are no restrictions for this procedure. Please do NOT wear cologne, perfume, aftershave, or lotions (deodorant is allowed). Please arrive 15 minutes prior to your appointment time.  Please note: We ask at that you not bring children with you during ultrasound (echo/ vascular)  testing. Due to room size and safety concerns, children are not allowed in the ultrasound rooms during exams. Our front office staff cannot provide observation of children in our lobby area while testing is being conducted. An adult accompanying a patient to their appointment will only be allowed in the ultrasound room at the discretion of the ultrasound technician under special circumstances. We apologize for any inconvenience.   Follow-Up: At [facility], you and your health needs are our priority.  As part of our continuing mission to provide you with exceptional heart care, our providers are all part of one team.  This team includes your primary Cardiologist (physician) and Advanced Practice Providers or APPs (Physician Assistants and Nurse Practitioners) who all work together to provide you with the care you need, when you need it.  Your next appointment:   12 week(s)  Provider:   Richar Dunklee, DO          Dispo:  No follow-ups on file.   Medication Adjustments/Labs and Tests Ordered: Current medicines are reviewed at length with the patient today.  Concerns regarding medicines are outlined above.  Tests Ordered: Orders Placed This Encounter  Procedures   ECHOCARDIOGRAM COMPLETE   Medication Changes: Meds ordered this encounter  Medications   hydrochlorothiazide  (MICROZIDE ) 12.5 MG capsule    Sig: Take 1 capsule (12.5 mg total) by mouth daily.    Dispense:  90 capsule    Refill:  3

## 2024-05-30 ENCOUNTER — Other Ambulatory Visit

## 2024-05-30 ENCOUNTER — Encounter: Payer: Self-pay | Admitting: Obstetrics and Gynecology

## 2024-05-30 ENCOUNTER — Ambulatory Visit: Admitting: Obstetrics and Gynecology

## 2024-06-09 ENCOUNTER — Encounter: Payer: Self-pay | Admitting: Obstetrics and Gynecology

## 2024-06-10 ENCOUNTER — Other Ambulatory Visit: Payer: Self-pay | Admitting: *Deleted

## 2024-06-10 MED ORDER — CELECOXIB 200 MG PO CAPS: 200.0000 mg | ORAL_CAPSULE | Freq: Every day | ORAL | 0 refills | Status: AC

## 2024-06-20 ENCOUNTER — Other Ambulatory Visit

## 2024-06-20 ENCOUNTER — Ambulatory Visit: Admitting: Obstetrics and Gynecology

## 2024-06-20 ENCOUNTER — Ambulatory Visit (HOSPITAL_COMMUNITY)

## 2024-06-24 ENCOUNTER — Other Ambulatory Visit: Payer: Self-pay | Admitting: *Deleted

## 2024-06-24 ENCOUNTER — Encounter: Payer: Self-pay | Admitting: Obstetrics and Gynecology

## 2024-06-24 MED ORDER — CELECOXIB 200 MG PO CAPS: 200.0000 mg | ORAL_CAPSULE | Freq: Every day | ORAL | 1 refills | Status: AC

## 2024-07-08 ENCOUNTER — Other Ambulatory Visit: Payer: Self-pay | Admitting: *Deleted

## 2024-07-08 ENCOUNTER — Encounter: Payer: Self-pay | Admitting: Obstetrics and Gynecology

## 2024-07-08 MED ORDER — CELECOXIB 200 MG PO CAPS: 200.0000 mg | ORAL_CAPSULE | Freq: Every day | ORAL | 2 refills | Status: AC

## 2024-07-22 ENCOUNTER — Encounter: Payer: Self-pay | Admitting: Obstetrics and Gynecology

## 2024-08-01 ENCOUNTER — Ambulatory Visit (HOSPITAL_COMMUNITY)
Admission: RE | Admit: 2024-08-01 | Discharge: 2024-08-01 | Disposition: A | Discharge status: Home / Self Care | Source: Ambulatory Visit | Attending: Internal Medicine | Admitting: Internal Medicine

## 2024-08-01 DIAGNOSIS — I119 Hypertensive heart disease without heart failure: Secondary | ICD-10-CM | POA: Insufficient documentation

## 2024-08-01 LAB — ECHOCARDIOGRAM COMPLETE
Area-P 1/2: 3.72 cm2
S' Lateral: 3.37 cm

## 2024-08-10 ENCOUNTER — Encounter: Payer: Self-pay | Admitting: Cardiology

## 2024-08-10 ENCOUNTER — Ambulatory Visit: Payer: Self-pay | Admitting: Cardiology

## 2024-08-12 ENCOUNTER — Encounter: Payer: Self-pay | Admitting: Cardiology

## 2024-08-18 ENCOUNTER — Encounter: Payer: Self-pay | Admitting: Obstetrics & Gynecology

## 2024-08-19 ENCOUNTER — Other Ambulatory Visit: Payer: Self-pay | Admitting: *Deleted

## 2024-08-19 MED ORDER — CELECOXIB 200 MG PO CAPS: 200.0000 mg | ORAL_CAPSULE | Freq: Every day | ORAL | 1 refills | Status: AC

## 2024-08-22 ENCOUNTER — Ambulatory Visit: Admitting: Cardiology
